# Patient Record
Sex: Female | Born: 1975 | Race: Black or African American | Hispanic: No | Marital: Single | Smoking: Never smoker
Health system: Southern US, Community
[De-identification: ages and names within clinical notes are randomized; demographics above are authoritative.]

## PROBLEM LIST (undated history)

## (undated) DIAGNOSIS — T783XXA Angioneurotic edema, initial encounter: Secondary | ICD-10-CM

## (undated) DIAGNOSIS — J45909 Unspecified asthma, uncomplicated: Secondary | ICD-10-CM

## (undated) DIAGNOSIS — F319 Bipolar disorder, unspecified: Secondary | ICD-10-CM

## (undated) DIAGNOSIS — D509 Iron deficiency anemia, unspecified: Secondary | ICD-10-CM

## (undated) DIAGNOSIS — Z91148 Patient's other noncompliance with medication regimen for other reason: Secondary | ICD-10-CM

## (undated) DIAGNOSIS — N12 Tubulo-interstitial nephritis, not specified as acute or chronic: Secondary | ICD-10-CM

## (undated) DIAGNOSIS — J189 Pneumonia, unspecified organism: Secondary | ICD-10-CM

## (undated) DIAGNOSIS — E079 Disorder of thyroid, unspecified: Secondary | ICD-10-CM

## (undated) DIAGNOSIS — K219 Gastro-esophageal reflux disease without esophagitis: Secondary | ICD-10-CM

## (undated) DIAGNOSIS — B1910 Unspecified viral hepatitis B without hepatic coma: Secondary | ICD-10-CM

## (undated) DIAGNOSIS — Z9289 Personal history of other medical treatment: Secondary | ICD-10-CM

## (undated) DIAGNOSIS — Z21 Asymptomatic human immunodeficiency virus [HIV] infection status: Secondary | ICD-10-CM

## (undated) DIAGNOSIS — L509 Urticaria, unspecified: Secondary | ICD-10-CM

## (undated) DIAGNOSIS — F32A Depression, unspecified: Secondary | ICD-10-CM

## (undated) DIAGNOSIS — F329 Major depressive disorder, single episode, unspecified: Secondary | ICD-10-CM

## (undated) DIAGNOSIS — IMO0001 Reserved for inherently not codable concepts without codable children: Secondary | ICD-10-CM

## (undated) DIAGNOSIS — F23 Brief psychotic disorder: Secondary | ICD-10-CM

## (undated) DIAGNOSIS — F419 Anxiety disorder, unspecified: Secondary | ICD-10-CM

## (undated) DIAGNOSIS — Z9114 Patient's other noncompliance with medication regimen: Secondary | ICD-10-CM

## (undated) HISTORY — DX: Urticaria, unspecified: L50.9

## (undated) HISTORY — DX: Angioneurotic edema, initial encounter: T78.3XXA

## (undated) HISTORY — DX: Depression, unspecified: F32.A

## (undated) HISTORY — PX: THYROID SURGERY: SHX805

## (undated) HISTORY — DX: Major depressive disorder, single episode, unspecified: F32.9

## (undated) HISTORY — PX: CHOLECYSTECTOMY: SHX55

---

## 1998-01-26 ENCOUNTER — Inpatient Hospital Stay (HOSPITAL_COMMUNITY): Admission: AD | Admit: 1998-01-26 | Discharge: 1998-01-27 | Payer: Self-pay | Admitting: *Deleted

## 1998-04-20 ENCOUNTER — Inpatient Hospital Stay (HOSPITAL_COMMUNITY): Admission: AD | Admit: 1998-04-20 | Discharge: 1998-04-20 | Payer: Self-pay | Admitting: *Deleted

## 1998-07-14 ENCOUNTER — Emergency Department (HOSPITAL_COMMUNITY): Admission: EM | Admit: 1998-07-14 | Discharge: 1998-07-14 | Payer: Self-pay | Admitting: Emergency Medicine

## 1998-11-16 ENCOUNTER — Inpatient Hospital Stay (HOSPITAL_COMMUNITY): Admission: AD | Admit: 1998-11-16 | Discharge: 1998-11-16 | Payer: Self-pay | Admitting: *Deleted

## 1998-11-27 ENCOUNTER — Emergency Department (HOSPITAL_COMMUNITY): Admission: EM | Admit: 1998-11-27 | Discharge: 1998-11-27 | Payer: Self-pay | Admitting: Emergency Medicine

## 1999-08-30 ENCOUNTER — Emergency Department (HOSPITAL_COMMUNITY): Admission: EM | Admit: 1999-08-30 | Discharge: 1999-08-30 | Payer: Self-pay | Admitting: Emergency Medicine

## 1999-09-04 ENCOUNTER — Emergency Department (HOSPITAL_COMMUNITY): Admission: EM | Admit: 1999-09-04 | Discharge: 1999-09-04 | Payer: Self-pay

## 2000-04-24 ENCOUNTER — Encounter: Admission: RE | Admit: 2000-04-24 | Discharge: 2000-04-24 | Payer: Self-pay | Admitting: Infectious Diseases

## 2000-04-24 ENCOUNTER — Encounter (INDEPENDENT_AMBULATORY_CARE_PROVIDER_SITE_OTHER): Payer: Self-pay | Admitting: *Deleted

## 2000-04-24 ENCOUNTER — Ambulatory Visit (HOSPITAL_COMMUNITY): Admission: RE | Admit: 2000-04-24 | Discharge: 2000-04-24 | Payer: Self-pay | Admitting: Infectious Diseases

## 2000-04-24 LAB — CONVERTED CEMR LAB: CD4 T Cell Abs: 250

## 2000-05-20 ENCOUNTER — Encounter: Admission: RE | Admit: 2000-05-20 | Discharge: 2000-05-20 | Payer: Self-pay | Admitting: Internal Medicine

## 2000-05-20 ENCOUNTER — Ambulatory Visit (HOSPITAL_COMMUNITY): Admission: RE | Admit: 2000-05-20 | Discharge: 2000-05-20 | Payer: Self-pay | Admitting: Internal Medicine

## 2000-06-10 ENCOUNTER — Encounter: Admission: RE | Admit: 2000-06-10 | Discharge: 2000-06-10 | Payer: Self-pay | Admitting: Internal Medicine

## 2000-06-20 ENCOUNTER — Encounter: Admission: RE | Admit: 2000-06-20 | Discharge: 2000-06-20 | Payer: Self-pay | Admitting: Infectious Diseases

## 2000-06-24 ENCOUNTER — Encounter: Admission: RE | Admit: 2000-06-24 | Discharge: 2000-06-24 | Payer: Self-pay | Admitting: Infectious Diseases

## 2000-07-22 ENCOUNTER — Encounter: Admission: RE | Admit: 2000-07-22 | Discharge: 2000-07-22 | Payer: Self-pay | Admitting: Infectious Diseases

## 2000-08-05 ENCOUNTER — Encounter: Admission: RE | Admit: 2000-08-05 | Discharge: 2000-08-05 | Payer: Self-pay | Admitting: Infectious Diseases

## 2000-09-18 ENCOUNTER — Encounter: Admission: RE | Admit: 2000-09-18 | Discharge: 2000-09-18 | Payer: Self-pay | Admitting: Internal Medicine

## 2001-10-05 ENCOUNTER — Emergency Department (HOSPITAL_COMMUNITY): Admission: EM | Admit: 2001-10-05 | Discharge: 2001-10-05 | Payer: Self-pay | Admitting: Emergency Medicine

## 2002-01-04 ENCOUNTER — Emergency Department (HOSPITAL_COMMUNITY): Admission: EM | Admit: 2002-01-04 | Discharge: 2002-01-04 | Payer: Self-pay | Admitting: *Deleted

## 2002-02-19 ENCOUNTER — Other Ambulatory Visit: Admission: RE | Admit: 2002-02-19 | Discharge: 2002-02-19 | Payer: Self-pay | Admitting: Obstetrics and Gynecology

## 2002-03-18 ENCOUNTER — Encounter: Admission: RE | Admit: 2002-03-18 | Discharge: 2002-03-18 | Payer: Self-pay | Admitting: *Deleted

## 2002-03-18 ENCOUNTER — Ambulatory Visit (HOSPITAL_COMMUNITY): Admission: RE | Admit: 2002-03-18 | Discharge: 2002-03-18 | Payer: Self-pay | Admitting: *Deleted

## 2002-03-23 ENCOUNTER — Encounter: Admission: RE | Admit: 2002-03-23 | Discharge: 2002-03-23 | Payer: Self-pay | Admitting: Internal Medicine

## 2002-03-26 ENCOUNTER — Ambulatory Visit (HOSPITAL_COMMUNITY): Admission: RE | Admit: 2002-03-26 | Discharge: 2002-03-26 | Payer: Self-pay | Admitting: *Deleted

## 2002-03-30 ENCOUNTER — Encounter: Admission: RE | Admit: 2002-03-30 | Discharge: 2002-03-30 | Payer: Self-pay | Admitting: *Deleted

## 2002-04-01 ENCOUNTER — Encounter: Admission: RE | Admit: 2002-04-01 | Discharge: 2002-04-01 | Payer: Self-pay | Admitting: *Deleted

## 2002-04-08 ENCOUNTER — Encounter: Admission: RE | Admit: 2002-04-08 | Discharge: 2002-04-08 | Payer: Self-pay | Admitting: *Deleted

## 2002-04-08 ENCOUNTER — Encounter (INDEPENDENT_AMBULATORY_CARE_PROVIDER_SITE_OTHER): Payer: Self-pay | Admitting: *Deleted

## 2002-04-20 ENCOUNTER — Encounter: Admission: RE | Admit: 2002-04-20 | Discharge: 2002-04-20 | Payer: Self-pay | Admitting: Internal Medicine

## 2002-04-21 ENCOUNTER — Ambulatory Visit (HOSPITAL_COMMUNITY): Admission: RE | Admit: 2002-04-21 | Discharge: 2002-04-21 | Payer: Self-pay | Admitting: *Deleted

## 2002-04-22 ENCOUNTER — Encounter: Admission: RE | Admit: 2002-04-22 | Discharge: 2002-04-22 | Payer: Self-pay | Admitting: *Deleted

## 2002-05-06 ENCOUNTER — Encounter: Admission: RE | Admit: 2002-05-06 | Discharge: 2002-05-06 | Payer: Self-pay | Admitting: *Deleted

## 2002-05-19 ENCOUNTER — Inpatient Hospital Stay (HOSPITAL_COMMUNITY): Admission: AD | Admit: 2002-05-19 | Discharge: 2002-05-19 | Payer: Self-pay | Admitting: *Deleted

## 2002-05-20 ENCOUNTER — Encounter: Admission: RE | Admit: 2002-05-20 | Discharge: 2002-05-20 | Payer: Self-pay | Admitting: *Deleted

## 2002-05-26 ENCOUNTER — Observation Stay (HOSPITAL_COMMUNITY): Admission: AD | Admit: 2002-05-26 | Discharge: 2002-05-27 | Payer: Self-pay | Admitting: *Deleted

## 2002-05-28 ENCOUNTER — Inpatient Hospital Stay (HOSPITAL_COMMUNITY): Admission: AD | Admit: 2002-05-28 | Discharge: 2002-05-28 | Payer: Self-pay | Admitting: *Deleted

## 2002-06-01 ENCOUNTER — Ambulatory Visit (HOSPITAL_COMMUNITY): Admission: RE | Admit: 2002-06-01 | Discharge: 2002-06-01 | Payer: Self-pay | Admitting: Infectious Diseases

## 2002-06-01 ENCOUNTER — Encounter: Admission: RE | Admit: 2002-06-01 | Discharge: 2002-06-01 | Payer: Self-pay | Admitting: Internal Medicine

## 2002-06-03 ENCOUNTER — Encounter: Admission: RE | Admit: 2002-06-03 | Discharge: 2002-06-03 | Payer: Self-pay | Admitting: *Deleted

## 2002-06-10 ENCOUNTER — Encounter: Admission: RE | Admit: 2002-06-10 | Discharge: 2002-06-10 | Payer: Self-pay | Admitting: *Deleted

## 2002-06-15 ENCOUNTER — Encounter: Admission: RE | Admit: 2002-06-15 | Discharge: 2002-06-15 | Payer: Self-pay | Admitting: Internal Medicine

## 2002-06-17 ENCOUNTER — Encounter: Admission: RE | Admit: 2002-06-17 | Discharge: 2002-06-17 | Payer: Self-pay | Admitting: *Deleted

## 2002-06-24 ENCOUNTER — Inpatient Hospital Stay (HOSPITAL_COMMUNITY): Admission: RE | Admit: 2002-06-24 | Discharge: 2002-06-24 | Payer: Self-pay | Admitting: *Deleted

## 2002-06-24 ENCOUNTER — Encounter: Payer: Self-pay | Admitting: Obstetrics and Gynecology

## 2002-06-24 ENCOUNTER — Encounter: Admission: RE | Admit: 2002-06-24 | Discharge: 2002-06-24 | Payer: Self-pay | Admitting: *Deleted

## 2002-06-26 ENCOUNTER — Inpatient Hospital Stay (HOSPITAL_COMMUNITY): Admission: AD | Admit: 2002-06-26 | Discharge: 2002-06-26 | Payer: Self-pay | Admitting: *Deleted

## 2002-06-28 ENCOUNTER — Encounter: Payer: Self-pay | Admitting: *Deleted

## 2002-06-28 ENCOUNTER — Ambulatory Visit (HOSPITAL_COMMUNITY): Admission: RE | Admit: 2002-06-28 | Discharge: 2002-06-28 | Payer: Self-pay | Admitting: *Deleted

## 2002-06-29 ENCOUNTER — Encounter (HOSPITAL_COMMUNITY): Admission: RE | Admit: 2002-06-29 | Discharge: 2002-07-16 | Payer: Self-pay | Admitting: *Deleted

## 2002-06-29 ENCOUNTER — Encounter: Payer: Self-pay | Admitting: *Deleted

## 2002-07-01 ENCOUNTER — Encounter: Admission: RE | Admit: 2002-07-01 | Discharge: 2002-07-01 | Payer: Self-pay | Admitting: Obstetrics and Gynecology

## 2002-07-02 ENCOUNTER — Inpatient Hospital Stay (HOSPITAL_COMMUNITY): Admission: AD | Admit: 2002-07-02 | Discharge: 2002-07-02 | Payer: Self-pay | Admitting: Obstetrics and Gynecology

## 2002-07-07 ENCOUNTER — Encounter: Admission: RE | Admit: 2002-07-07 | Discharge: 2002-07-07 | Payer: Self-pay | Admitting: *Deleted

## 2002-07-09 ENCOUNTER — Encounter: Payer: Self-pay | Admitting: *Deleted

## 2002-07-14 ENCOUNTER — Encounter: Admission: RE | Admit: 2002-07-14 | Discharge: 2002-07-14 | Payer: Self-pay | Admitting: *Deleted

## 2002-07-16 ENCOUNTER — Inpatient Hospital Stay (HOSPITAL_COMMUNITY): Admission: AD | Admit: 2002-07-16 | Discharge: 2002-07-19 | Payer: Self-pay | Admitting: Obstetrics and Gynecology

## 2002-07-16 ENCOUNTER — Encounter (INDEPENDENT_AMBULATORY_CARE_PROVIDER_SITE_OTHER): Payer: Self-pay | Admitting: *Deleted

## 2002-07-16 HISTORY — PX: TUBAL LIGATION: SHX77

## 2002-07-19 ENCOUNTER — Encounter (INDEPENDENT_AMBULATORY_CARE_PROVIDER_SITE_OTHER): Payer: Self-pay | Admitting: *Deleted

## 2002-07-19 ENCOUNTER — Encounter (HOSPITAL_BASED_OUTPATIENT_CLINIC_OR_DEPARTMENT_OTHER): Payer: Self-pay | Admitting: General Surgery

## 2002-07-19 ENCOUNTER — Ambulatory Visit (HOSPITAL_COMMUNITY): Admission: RE | Admit: 2002-07-19 | Discharge: 2002-07-19 | Payer: Self-pay | Admitting: General Surgery

## 2002-08-13 ENCOUNTER — Encounter (HOSPITAL_BASED_OUTPATIENT_CLINIC_OR_DEPARTMENT_OTHER): Payer: Self-pay | Admitting: General Surgery

## 2002-08-16 HISTORY — PX: THYROID LOBECTOMY: SHX420

## 2002-08-17 ENCOUNTER — Encounter: Admission: RE | Admit: 2002-08-17 | Discharge: 2002-08-17 | Payer: Self-pay | Admitting: Internal Medicine

## 2002-08-17 ENCOUNTER — Ambulatory Visit (HOSPITAL_COMMUNITY): Admission: RE | Admit: 2002-08-17 | Discharge: 2002-08-17 | Payer: Self-pay | Admitting: Internal Medicine

## 2002-08-19 ENCOUNTER — Ambulatory Visit (HOSPITAL_COMMUNITY): Admission: RE | Admit: 2002-08-19 | Discharge: 2002-08-20 | Payer: Self-pay | Admitting: General Surgery

## 2002-08-19 ENCOUNTER — Encounter (INDEPENDENT_AMBULATORY_CARE_PROVIDER_SITE_OTHER): Payer: Self-pay | Admitting: *Deleted

## 2003-06-15 ENCOUNTER — Encounter: Admission: RE | Admit: 2003-06-15 | Discharge: 2003-06-15 | Payer: Self-pay | Admitting: Infectious Diseases

## 2003-06-15 ENCOUNTER — Ambulatory Visit (HOSPITAL_COMMUNITY): Admission: RE | Admit: 2003-06-15 | Discharge: 2003-06-15 | Payer: Self-pay | Admitting: Infectious Diseases

## 2003-06-15 ENCOUNTER — Encounter: Payer: Self-pay | Admitting: Infectious Diseases

## 2003-06-22 ENCOUNTER — Encounter: Admission: RE | Admit: 2003-06-22 | Discharge: 2003-06-22 | Payer: Self-pay | Admitting: Internal Medicine

## 2003-09-06 ENCOUNTER — Encounter: Admission: RE | Admit: 2003-09-06 | Discharge: 2003-09-06 | Payer: Self-pay | Admitting: Infectious Diseases

## 2003-09-06 ENCOUNTER — Ambulatory Visit (HOSPITAL_COMMUNITY): Admission: RE | Admit: 2003-09-06 | Discharge: 2003-09-06 | Payer: Self-pay | Admitting: Internal Medicine

## 2003-09-06 ENCOUNTER — Encounter: Payer: Self-pay | Admitting: Internal Medicine

## 2003-09-20 ENCOUNTER — Encounter: Admission: RE | Admit: 2003-09-20 | Discharge: 2003-09-20 | Payer: Self-pay | Admitting: Internal Medicine

## 2003-09-29 ENCOUNTER — Encounter (INDEPENDENT_AMBULATORY_CARE_PROVIDER_SITE_OTHER): Payer: Self-pay | Admitting: *Deleted

## 2003-09-29 ENCOUNTER — Encounter: Admission: RE | Admit: 2003-09-29 | Discharge: 2003-09-29 | Payer: Self-pay | Admitting: Obstetrics and Gynecology

## 2003-10-03 ENCOUNTER — Encounter: Payer: Self-pay | Admitting: Internal Medicine

## 2004-04-05 ENCOUNTER — Ambulatory Visit (HOSPITAL_COMMUNITY): Admission: RE | Admit: 2004-04-05 | Discharge: 2004-04-05 | Payer: Self-pay | Admitting: Internal Medicine

## 2004-04-05 ENCOUNTER — Encounter: Admission: RE | Admit: 2004-04-05 | Discharge: 2004-04-05 | Payer: Self-pay | Admitting: Internal Medicine

## 2005-01-11 ENCOUNTER — Ambulatory Visit: Payer: Self-pay | Admitting: Internal Medicine

## 2005-01-11 ENCOUNTER — Inpatient Hospital Stay (HOSPITAL_COMMUNITY): Admission: EM | Admit: 2005-01-11 | Discharge: 2005-01-15 | Payer: Self-pay | Admitting: Emergency Medicine

## 2005-01-22 ENCOUNTER — Ambulatory Visit: Payer: Self-pay | Admitting: Internal Medicine

## 2005-01-29 ENCOUNTER — Ambulatory Visit: Payer: Self-pay | Admitting: Internal Medicine

## 2005-04-01 ENCOUNTER — Encounter (INDEPENDENT_AMBULATORY_CARE_PROVIDER_SITE_OTHER): Payer: Self-pay | Admitting: *Deleted

## 2005-04-01 ENCOUNTER — Ambulatory Visit: Payer: Self-pay | Admitting: Internal Medicine

## 2005-04-01 ENCOUNTER — Ambulatory Visit (HOSPITAL_COMMUNITY): Admission: RE | Admit: 2005-04-01 | Discharge: 2005-04-01 | Payer: Self-pay | Admitting: Internal Medicine

## 2005-04-01 LAB — CONVERTED CEMR LAB: HIV 1 RNA Quant: 915 copies/mL

## 2005-04-23 ENCOUNTER — Ambulatory Visit: Payer: Self-pay | Admitting: Internal Medicine

## 2005-10-16 ENCOUNTER — Encounter (INDEPENDENT_AMBULATORY_CARE_PROVIDER_SITE_OTHER): Payer: Self-pay | Admitting: *Deleted

## 2005-10-16 ENCOUNTER — Ambulatory Visit: Payer: Self-pay | Admitting: Internal Medicine

## 2005-10-16 ENCOUNTER — Ambulatory Visit (HOSPITAL_COMMUNITY): Admission: RE | Admit: 2005-10-16 | Discharge: 2005-10-16 | Payer: Self-pay | Admitting: Internal Medicine

## 2005-10-16 LAB — CONVERTED CEMR LAB
CD4 Count: 70 microliters
HIV 1 RNA Quant: 14100 copies/mL

## 2005-10-24 ENCOUNTER — Ambulatory Visit: Payer: Self-pay | Admitting: Internal Medicine

## 2005-10-31 ENCOUNTER — Encounter: Payer: Self-pay | Admitting: Internal Medicine

## 2006-03-26 ENCOUNTER — Ambulatory Visit: Payer: Self-pay | Admitting: Internal Medicine

## 2006-04-02 ENCOUNTER — Ambulatory Visit (HOSPITAL_COMMUNITY): Admission: RE | Admit: 2006-04-02 | Discharge: 2006-04-02 | Payer: Self-pay | Admitting: Internal Medicine

## 2007-02-09 ENCOUNTER — Encounter (INDEPENDENT_AMBULATORY_CARE_PROVIDER_SITE_OTHER): Payer: Self-pay | Admitting: *Deleted

## 2007-02-09 LAB — CONVERTED CEMR LAB

## 2007-02-22 ENCOUNTER — Encounter (INDEPENDENT_AMBULATORY_CARE_PROVIDER_SITE_OTHER): Payer: Self-pay | Admitting: *Deleted

## 2009-01-30 ENCOUNTER — Emergency Department (HOSPITAL_COMMUNITY): Admission: EM | Admit: 2009-01-30 | Discharge: 2009-01-30 | Payer: Self-pay | Admitting: Emergency Medicine

## 2009-02-01 ENCOUNTER — Emergency Department (HOSPITAL_COMMUNITY): Admission: EM | Admit: 2009-02-01 | Discharge: 2009-02-01 | Payer: Self-pay | Admitting: Family Medicine

## 2009-02-07 ENCOUNTER — Emergency Department (HOSPITAL_COMMUNITY): Admission: EM | Admit: 2009-02-07 | Discharge: 2009-02-07 | Payer: Self-pay | Admitting: Emergency Medicine

## 2009-02-13 DIAGNOSIS — J189 Pneumonia, unspecified organism: Secondary | ICD-10-CM

## 2009-02-13 HISTORY — DX: Pneumonia, unspecified organism: J18.9

## 2009-02-15 ENCOUNTER — Inpatient Hospital Stay (HOSPITAL_COMMUNITY): Admission: EM | Admit: 2009-02-15 | Discharge: 2009-02-18 | Payer: Self-pay | Admitting: Emergency Medicine

## 2009-02-15 ENCOUNTER — Encounter: Payer: Self-pay | Admitting: Internal Medicine

## 2009-02-15 LAB — CONVERTED CEMR LAB: CD4 Count: 10 microliters

## 2009-02-16 ENCOUNTER — Ambulatory Visit: Payer: Self-pay | Admitting: Internal Medicine

## 2009-02-27 ENCOUNTER — Encounter: Payer: Self-pay | Admitting: Internal Medicine

## 2009-02-27 DIAGNOSIS — D509 Iron deficiency anemia, unspecified: Secondary | ICD-10-CM

## 2009-02-28 ENCOUNTER — Ambulatory Visit: Payer: Self-pay | Admitting: Internal Medicine

## 2009-02-28 ENCOUNTER — Encounter (INDEPENDENT_AMBULATORY_CARE_PROVIDER_SITE_OTHER): Payer: Self-pay | Admitting: *Deleted

## 2009-02-28 DIAGNOSIS — B59 Pneumocystosis: Secondary | ICD-10-CM | POA: Insufficient documentation

## 2009-02-28 DIAGNOSIS — A6000 Herpesviral infection of urogenital system, unspecified: Secondary | ICD-10-CM

## 2009-02-28 DIAGNOSIS — B20 Human immunodeficiency virus [HIV] disease: Secondary | ICD-10-CM

## 2009-03-01 ENCOUNTER — Encounter (INDEPENDENT_AMBULATORY_CARE_PROVIDER_SITE_OTHER): Payer: Self-pay | Admitting: *Deleted

## 2009-04-03 ENCOUNTER — Ambulatory Visit: Payer: Self-pay | Admitting: Internal Medicine

## 2009-04-05 ENCOUNTER — Encounter: Payer: Self-pay | Admitting: Internal Medicine

## 2009-04-10 ENCOUNTER — Encounter: Payer: Self-pay | Admitting: Internal Medicine

## 2009-04-12 ENCOUNTER — Encounter: Payer: Self-pay | Admitting: Internal Medicine

## 2009-04-14 ENCOUNTER — Emergency Department (HOSPITAL_COMMUNITY): Admission: EM | Admit: 2009-04-14 | Discharge: 2009-04-14 | Payer: Self-pay | Admitting: Emergency Medicine

## 2009-08-18 ENCOUNTER — Telehealth (INDEPENDENT_AMBULATORY_CARE_PROVIDER_SITE_OTHER): Payer: Self-pay | Admitting: *Deleted

## 2009-09-25 ENCOUNTER — Encounter: Payer: Self-pay | Admitting: Internal Medicine

## 2010-06-05 ENCOUNTER — Encounter (INDEPENDENT_AMBULATORY_CARE_PROVIDER_SITE_OTHER): Payer: Self-pay | Admitting: *Deleted

## 2010-06-05 ENCOUNTER — Emergency Department (HOSPITAL_COMMUNITY): Admission: EM | Admit: 2010-06-05 | Discharge: 2010-06-05 | Payer: Self-pay | Admitting: Emergency Medicine

## 2010-06-05 LAB — CONVERTED CEMR LAB: Hemoglobin: 8.2 g/dL

## 2010-06-11 ENCOUNTER — Emergency Department (HOSPITAL_COMMUNITY): Admission: EM | Admit: 2010-06-11 | Discharge: 2010-06-11 | Payer: Self-pay | Admitting: Emergency Medicine

## 2010-06-11 ENCOUNTER — Encounter (INDEPENDENT_AMBULATORY_CARE_PROVIDER_SITE_OTHER): Payer: Self-pay | Admitting: *Deleted

## 2010-06-11 LAB — CONVERTED CEMR LAB
Creatinine, Ser: 0.76 mg/dL
Hemoglobin: 9.4 g/dL

## 2010-06-12 ENCOUNTER — Encounter (INDEPENDENT_AMBULATORY_CARE_PROVIDER_SITE_OTHER): Payer: Self-pay | Admitting: *Deleted

## 2011-01-15 NOTE — Miscellaneous (Signed)
Summary: Lab results updated from ED reports  Clinical Lists Changes  Observations: Added new observation of CREATININE: 0.76 mg/dL (04/54/0981 19:14) Added new observation of PLATELETK/UL: 224 K/uL (06/11/2010 16:50) Added new observation of WBC COUNT: 2.7 10*3/microliter (06/11/2010 16:50) Added new observation of HGB: 9.4 g/dL (78/29/5621 30:86) Added new observation of GLUCOSE SER: 93 mg/dL (57/84/6962 95:28) Added new observation of PLATELETK/UL: 275 K/uL (06/05/2010 16:49) Added new observation of WBC COUNT: 4.2 10*3/microliter (06/05/2010 16:49) Added new observation of HGB: 8.2 g/dL (41/32/4401 02:72)      -  Date:  06/11/2010    Hemoglobin: 9.4    WBC: 2.7    Platelets: 224    Glucose: 93    Creatinine: 0.76  Date:  06/05/2010    Hemoglobin: 8.2    WBC: 4.2    Platelets: 275

## 2011-03-03 LAB — URINE MICROSCOPIC-ADD ON

## 2011-03-03 LAB — COMPREHENSIVE METABOLIC PANEL
AST: 38 U/L — ABNORMAL HIGH (ref 0–37)
Albumin: 3.6 g/dL (ref 3.5–5.2)
Alkaline Phosphatase: 89 U/L (ref 39–117)
Chloride: 102 mEq/L (ref 96–112)
Creatinine, Ser: 0.76 mg/dL (ref 0.4–1.2)
GFR calc Af Amer: >60 mL/min (ref >60)
Potassium: 4.3 mEq/L (ref 3.5–5.1)
Sodium: 137 mEq/L (ref 135–145)
Total Bilirubin: 0.9 mg/dL (ref 0.3–1.2)

## 2011-03-03 LAB — DIFFERENTIAL
Basophils Relative: 0 % (ref 0–1)
Basophils Relative: 0 % (ref 0–1)
Eosinophils Absolute: 0.1 10*3/uL (ref 0.0–0.7)
Eosinophils Relative: 4 % (ref 0–5)
Monocytes Absolute: 0.3 10*3/uL (ref 0.1–1.0)
Monocytes Absolute: 0.4 10*3/uL (ref 0.1–1.0)
Monocytes Relative: 10 % (ref 3–12)
Neutrophils Relative %: 70 % (ref 43–77)

## 2011-03-03 LAB — URINALYSIS, ROUTINE W REFLEX MICROSCOPIC
Bilirubin Urine: NEGATIVE
Bilirubin Urine: NEGATIVE
Ketones, ur: 40 mg/dL — AB
Ketones, ur: NEGATIVE mg/dL
Nitrite: NEGATIVE
Protein, ur: NEGATIVE mg/dL
Specific Gravity, Urine: 1.013 (ref 1.005–1.030)
Urobilinogen, UA: 0.2 mg/dL (ref 0.0–1.0)
Urobilinogen, UA: 0.2 mg/dL (ref 0.0–1.0)

## 2011-03-03 LAB — RAPID STREP SCREEN (MED CTR MEBANE ONLY): Streptococcus, Group A Screen (Direct): NEGATIVE

## 2011-03-03 LAB — CBC
Hemoglobin: 8.2 g/dL — ABNORMAL LOW (ref 12.0–15.0)
MCHC: 32.5 g/dL (ref 30.0–36.0)
MCV: 70.3 fL — ABNORMAL LOW (ref 78.0–100.0)
Platelets: 224 10*3/uL (ref 150–400)
RBC: 3.6 MIL/uL — ABNORMAL LOW (ref 3.87–5.11)
RBC: 4.19 MIL/uL (ref 3.87–5.11)
WBC: 2.7 10*3/uL — ABNORMAL LOW (ref 4.0–10.5)

## 2011-03-03 LAB — POCT PREGNANCY, URINE: Preg Test, Ur: NEGATIVE

## 2011-03-03 LAB — PROTIME-INR: Prothrombin Time: 13.9 seconds (ref 11.6–15.2)

## 2011-03-28 LAB — POCT I-STAT, CHEM 8
BUN: 5 mg/dL — ABNORMAL LOW (ref 6–23)
HCT: 28 % — ABNORMAL LOW (ref 36.0–46.0)
Hemoglobin: 9.5 g/dL — ABNORMAL LOW (ref 12.0–15.0)
Sodium: 133 mEq/L — ABNORMAL LOW (ref 135–145)
TCO2: 24 mmol/L (ref 0–100)

## 2011-03-28 LAB — BASIC METABOLIC PANEL
BUN: 5 mg/dL — ABNORMAL LOW (ref 6–23)
BUN: 5 mg/dL — ABNORMAL LOW (ref 6–23)
BUN: 9 mg/dL (ref 6–23)
CO2: 22 mEq/L (ref 19–32)
Calcium: 8.2 mg/dL — ABNORMAL LOW (ref 8.4–10.5)
Calcium: 8.7 mg/dL (ref 8.4–10.5)
Chloride: 108 mEq/L (ref 96–112)
Creatinine, Ser: 0.64 mg/dL (ref 0.4–1.2)
Creatinine, Ser: 0.66 mg/dL (ref 0.4–1.2)
GFR calc Af Amer: >60 mL/min (ref >60)
GFR calc Af Amer: >60 mL/min (ref >60)
GFR calc non Af Amer: >60 mL/min (ref >60)
Glucose, Bld: 116 mg/dL — ABNORMAL HIGH (ref 70–99)
Potassium: 4.8 mEq/L (ref 3.5–5.1)

## 2011-03-28 LAB — DIFFERENTIAL
Basophils Absolute: 0 10*3/uL (ref 0.0–0.1)
Basophils Relative: 0 % (ref 0–1)
Basophils Relative: 1 % (ref 0–1)
Eosinophils Absolute: 0.1 10*3/uL (ref 0.0–0.7)
Eosinophils Relative: 3 % (ref 0–5)
Lymphocytes Relative: 3 % — ABNORMAL LOW (ref 12–46)
Neutro Abs: 5.7 10*3/uL (ref 1.7–7.7)
Neutrophils Relative %: 79 % — ABNORMAL HIGH (ref 43–77)

## 2011-03-28 LAB — CBC
HCT: 26.3 % — ABNORMAL LOW (ref 36.0–46.0)
HCT: 26.8 % — ABNORMAL LOW (ref 36.0–46.0)
Hemoglobin: 8.7 g/dL — ABNORMAL LOW (ref 12.0–15.0)
MCHC: 32.8 g/dL (ref 30.0–36.0)
MCHC: 32.9 g/dL (ref 30.0–36.0)
MCHC: 33.2 g/dL (ref 30.0–36.0)
MCV: 70.7 fL — ABNORMAL LOW (ref 78.0–100.0)
MCV: 71.2 fL — ABNORMAL LOW (ref 78.0–100.0)
MCV: 71.5 fL — ABNORMAL LOW (ref 78.0–100.0)
Platelets: 322 10*3/uL (ref 150–400)
Platelets: 370 10*3/uL (ref 150–400)
Platelets: 375 10*3/uL (ref 150–400)
RBC: 3.56 MIL/uL — ABNORMAL LOW (ref 3.87–5.11)
RBC: 3.68 MIL/uL — ABNORMAL LOW (ref 3.87–5.11)
RDW: 16.2 % — ABNORMAL HIGH (ref 11.5–15.5)
WBC: 17.5 10*3/uL — ABNORMAL HIGH (ref 4.0–10.5)
WBC: 18.3 10*3/uL — ABNORMAL HIGH (ref 4.0–10.5)
WBC: 6.5 10*3/uL (ref 4.0–10.5)

## 2011-03-28 LAB — LIPID PANEL
Triglycerides: 52 mg/dL (ref <150)
VLDL: 10 mg/dL (ref 0–40)

## 2011-03-28 LAB — LACTATE DEHYDROGENASE
LDH: 266 U/L — ABNORMAL HIGH (ref 94–250)
LDH: 346 U/L — ABNORMAL HIGH (ref 94–250)

## 2011-03-28 LAB — POCT CARDIAC MARKERS: Myoglobin, poc: 81.9 ng/mL (ref 12–200)

## 2011-03-28 LAB — STOOL CULTURE

## 2011-03-28 LAB — CLOSTRIDIUM DIFFICILE EIA

## 2011-03-28 LAB — T-HELPER CELLS (CD4) COUNT (NOT AT ARMC)
CD4 % Helper T Cell: 2 % — ABNORMAL LOW (ref 33–55)
CD4 T Cell Abs: 10 uL — ABNORMAL LOW (ref 400–2700)

## 2011-03-28 LAB — HEMOGLOBIN A1C: Hgb A1c MFr Bld: 6.1 % (ref 4.6–6.1)

## 2011-03-28 LAB — HOMOCYSTEINE: Homocysteine: 6 umol/L (ref 4.0–15.4)

## 2011-03-28 LAB — TSH: TSH: 0.971 u[IU]/mL (ref 0.350–4.500)

## 2011-03-28 LAB — D-DIMER, QUANTITATIVE: D-Dimer, Quant: 0.8 ug/mL-FEU — ABNORMAL HIGH (ref 0.00–0.48)

## 2011-03-28 LAB — GIARDIA/CRYPTOSPORIDIUM SCREEN(EIA)

## 2011-04-02 LAB — CBC
Hemoglobin: 9.2 g/dL — ABNORMAL LOW (ref 12.0–15.0)
MCHC: 33.5 g/dL (ref 30.0–36.0)
Platelets: 272 10*3/uL (ref 150–400)
RDW: 15.7 % — ABNORMAL HIGH (ref 11.5–15.5)

## 2011-04-02 LAB — DIFFERENTIAL
Basophils Absolute: 0 10*3/uL (ref 0.0–0.1)
Basophils Relative: 0 % (ref 0–1)
Eosinophils Relative: 1 % (ref 0–5)
Monocytes Absolute: 0.2 10*3/uL (ref 0.1–1.0)
Neutro Abs: 5.3 10*3/uL (ref 1.7–7.7)

## 2011-04-02 LAB — URINALYSIS, ROUTINE W REFLEX MICROSCOPIC
Ketones, ur: NEGATIVE mg/dL
Nitrite: NEGATIVE
Protein, ur: NEGATIVE mg/dL

## 2011-04-02 LAB — POCT I-STAT, CHEM 8
Calcium, Ion: 1 mmol/L — ABNORMAL LOW (ref 1.12–1.32)
Chloride: 101 mEq/L (ref 96–112)
HCT: 28 % — ABNORMAL LOW (ref 36.0–46.0)
TCO2: 24 mmol/L (ref 0–100)

## 2011-04-30 NOTE — Discharge Summary (Signed)
NAMEJOSILYN, [name]               ACCOUNT NO.:  [id number]   MEDICAL RECORD NO.:  [id number]          PATIENT TYPE:  INP   LOCATION:  6702                         FACILITY:  [facility]   PHYSICIAN:  Michelene Gardener, MD    DATE OF BIRTH:  [birth year]   DATE OF ADMISSION:  02/14/2009  DATE OF DISCHARGE:  02/18/2009                               DISCHARGE SUMMARY   DISCHARGE DIAGNOSES:  1. 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  2. Hypoxemia that responded well to treatment, and the patient is now      saturating well on room air.  3. Human immunodeficiency virus, the patient has been noncompliant      with medications for the last 2 years.  4. Hypotension that resolved with IV fluids.  5. Diarrhea with negative stool studies.  6. Anemia.  7. Leukocytosis secondary to pneumonia and steroids.   DISCHARGE MEDICATIONS:  1. Prednisone 40 mg daily x2 days, then 30 mg daily x2 days, then 20      mg daily x2 days, then 10 mg daily x2 days, and then to stop.  2. Zithromax 1200 mg once weekly on Wednesday.  3. Diflucan 100 mg once weekly on Wednesday.  4. Bactrim 2 tablets 3 times daily for 3 weeks.   CONSULTATIONS:  Infectious Disease consultation by Cliffton Asters, MD   PROCEDURES:  None.   RADIOLOGY STUDIES:  1. Chest x-ray on February 14, 2009, showed patchy opacities in the right      lower lobe concerning for infectious etiologies.  2. Repeat x-ray on February 14, 2009, showed bilateral infiltrate with      basilar volume loss consistent with pneumonia.  3. CT angiography on February 14, 2009, showed no pulmonary embolism and      it showed bilateral airspace density consistent with widespread      pneumonia and 1-cm density in the left upper lobe that represent a      scar versus granuloma.  4. Chest x-ray on February 17, 2009, showed resolution of the airspace      disease with no evidence of infection or acute process.   Follow up with Dr. Cliffton Asters within a week.   COURSE OF HOSPITALIZATION:  1. Bilateral pneumonia.  This patient has been complaining of symptoms      of pneumonia for some time and was treated on different regimen of      outpatient treatment and she failed outpatient treatment.  On the      time of presentation, she underwent a chest x-ray and a CT scan of      the chest and results were mentioned above.  The patient was      admitted to the hospital for further evaluation.  She was started      on IV Avelox in addition to Solu-Medrol.  The assumption was      community-acquired pneumonia and possible PCP pneumonia with her      history of HIV.  The patient was also started on IV Bactrim to  cover the possibility of PCP pneumonia.  Sputum was ordered for      PCP.  The patient was not able to produce any sputum during the      hospital that has been induced by respiratory therapy at least for      3 days without success.  Infectious Disease consultation was done      by Dr. Cliffton Asters, who think her condition is mostly consistent      with PCP pneumonia.  The patient responded very well to the      treatment.  Her oxygen was titrated and she was able to maintain      very good saturation in the room air at the time of discharge.  At      the time of discharge, Dr. Orvan Falconer recommended to discontinue her      Avelox and to continue her in Bactrim.  I will put her in 3 weeks      of Bactrim to complete the full course for PCP.  The patient will      be discharged on prednisone tapering dose in addition to Bactrim.  2. Hypoxemia that was secondary to pneumonia and as mentioned above,      the patient was started initially on oxygen and that was titrated      down.  At the time of discharge, the patient was in room air with      saturation in high 90s.  3. Rule out TB that was a low suspicion and initially, the patient was      put in airborne isolation.  PPD was done and it was negative.  As       mentioned above, the patient was not able to produce sputum and      that was actually unsuccessful even with sputum induction.  Dr.      Orvan Falconer think this is very low possibility and no need for further      workup in that regard at this point.  4. HIV.  This patient has not been compliant with her medications for      more than 2 years.  A CD4 was done during this hospitalization and      it was only 10.  The patient was put in prophylactic medications      including Zithromax to be taken once a week in addition to Diflucan      100 mg once a week and Bactrim.  The patient will follow with Dr.      Cliffton Asters as an outpatient for further adjustment on her HIV      medications.  5. Hypotension that has been temporary and resolved with IV fluids.  6. Diarrhea.  Again that has been very mild.  There was suspicion      about possible C. diff because she has been on recent antibiotics.      For that the patient was placed on contact isolation and she was      started on Flagyl.  Since her presentation, she has been      asymptomatic and her diarrhea resolved.  C. diff was sent and it      came to be negative.  Contact isolation was discontinued and Flagyl      was discontinued.  7. Anemia.  Her hemoglobin remained stable.  8. Leukocytosis that was secondary to pneumonia in addition to      steroids.  At the time of discharge, her  WBC is still around 18.3.      I advised her to repeat her CBC when she sees Dr. Orvan Falconer to make      sure that her white counts are improving and that expected after      she finished her prednisone and her full treatment for pneumonia.   Otherwise, other medical conditions are stable at this time.  The  patient is to be discharged in stable condition.   TOTAL ASSESSMENT TIME:  40 minutes.      Michelene Gardener, MD  Electronically Signed     NAE/MEDQ  D:  02/18/2009  T:  02/19/2009  Job:  [id number]

## 2011-04-30 NOTE — H&P (Signed)
[name], [name]               ACCOUNT NO.:  [id number]   MEDICAL RECORD NO.:  [id number]          PATIENT TYPE:  INP   LOCATION:  6702                         FACILITY:  [facility]   PHYSICIAN:  Vania Rea, M.D. DATE OF BIRTH:  [birth year]   DATE OF ADMISSION:  02/15/2009  DATE OF DISCHARGE:                              HISTORY & PHYSICAL   PRIMARY CARE PHYSICIAN:  Unassigned.   CHIEF COMPLAINT:  Persistent cough for two months.   HISTORY OF PRESENT ILLNESS:  This is a 35 year old African American lady  who was diagnosed with HIV/AIDS at least seven years ago on computer  records, was at one time on HAART medication, her last documented CD4 in  our system was 51 in November 2006.  She said she has taken no  medication for the past two years and has been keeping well until about  two months ago when she developed a cough, fever and chills and was  initially self medicated with over-the-counter cough medications without  success, then acquired some tetracyclines which she took for about four  days without improvement.  Subsequently went to an urgent care, received  Z-pack, again no improvement.  Subsequently, received steroids and  bronchodilators for a diagnosis of bronchitis.  Again, without  improvement, came to our emergency room on February 23 and was treated  with prednisone and Tessalon Perles, again without improvement, and  eventually she turned up this time and had chest x-rays and CT scan done  which showed bilateral pneumonia and extensive ground-glass appearance  on CT scan.  Hospitalist service called to assist with management.   The patient complained of central chest pain worse with coughing.  She  complains of a 15-pound weight loss over the past three weeks.  Also,  complains of diarrhea for the past two days.   She says she has not taken any HIV medications for the past two years  because she lost her insurance.   PAST MEDICAL HISTORY:  1. Iron deficiency  anemia associated with menorrhagia.  2. HIV/AIDS.  3. Past history of hepatitis B with negative hepatic enzymes at the      last check.  4. 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  5. History of abnormal Pap smear.  6. Past history of pyelonephritis.   MEDICATIONS:  As noted above, currently, albuterol and iron.   ALLERGIES:  No known drug allergies.   SOCIAL HISTORY:  She denies tobacco or illicit drug use.  She uses  alcohol occasionally, works as a Sales executive, lives with her two  children ages 27 and 89.   FAMILY HISTORY:  Mother with coronary artery disease, cervical cancer,  breast cancer, diabetes and hypertension.  Father with coronary artery  disease.  Siblings are otherwise healthy.   REVIEW OF SYSTEMS:  A 10-point review of systems otherwise unremarkable.   PHYSICAL EXAMINATION:  GENERAL:  A thin, ill-looking, young, African  American lady lying in a stretcher.  VITAL SIGNS:  Temperature 97.8, pulse 76, respirations 20, blood  pressure 117/72, saturating at 100% on 2 liters.  HEENT:  Pupils are round and equal.  Mucous membranes pink and  anicteric.  She has no cervical lymphadenopathy or thyromegaly.  No  jugular venous distention.  CHEST:  Respiration is shallow.  Limited by pain.  She has occasional  rhonchi.  No crackles are heard.  CARDIOVASCULAR:  Regular rhythm.  ABDOMEN:  Soft and nontender.  EXTREMITIES:  Without edema.  She has 2+ pulses bilaterally.  CENTRAL NERVOUS SYSTEM:  Cranial nerves 2-12 are grossly intact.  She  has no focal neurological deficits.   LABORATORY DATA:  CBC is remarkable for a white count of 4.3, hemoglobin  9.1, MCV 7.7, platelets 370.  Her serum chemistry:  Sodium low at 133,  potassium 4.1, chloride 105, BUN 5, creatinine 0.7, otherwise,  unremarkable.  Chest x-ray as noted above.   PLAN:  Will admit this lady for a treatment of presumed PCP.  Although,   it is less likely, we will isolate her and collect sputum for AFB and  also place a PPD.  Will consult the Infectious Disease service for  assistance with management.  Other plans as per orders.      Vania Rea, M.D.  Electronically Signed     LC/MEDQ  D:  02/15/2009  T:  02/15/2009  Job:  [id number]

## 2011-05-03 NOTE — Op Note (Signed)
NAME:  [name], [name]                         ACCOUNT NO.:  [id number]   MEDICAL RECORD NO.:  [id number]                   PATIENT TYPE:  OIB   LOCATION:  5727                                 FACILITY:  [facility]   PHYSICIAN:  Luisa Hart L. Lurene Shadow, M.D.             DATE OF BIRTH:  March 03, 1976   DATE OF PROCEDURE:  08/19/2002  DATE OF DISCHARGE:                                 OPERATIVE REPORT   PREOPERATIVE DIAGNOSIS:  Follicular adenoma left thyroid lobe.   POSTOPERATIVE DIAGNOSIS:  Follicular adenoma left thyroid lobe, pathology  pending.   OPERATION:  Left thyroid lobectomy and isthmectomy.   SURGEON:  Mardene Celeste. Lurene Shadow, M.D.   ASSISTANTRiley Lam A. Magnus Ivan, M.D.   ANESTHESIA:  General   INDICATIONS FOR PROCEDURE:  The patient is a 35 year old woman who presented  with an enlarged left thyroid mass.  On cord biopsy this shows a follicular  tumor.  She is brought to the operating room now after the risks and  potential benefits of thyroid surgery have been fully discussed and she  gives consent.   DESCRIPTION OF PROCEDURE:  Following the induction of satisfactory general  anesthesia with the patient positioned supinely and head and neck  hyperextended, the neck was prepped and draped to be included in the sterile  operative field.  A transverse incision is made approximately two  fingerbreadths above the sternal notch and carried down to the skin,  subcutaneous tissues and across the platysma muscle.  Superior flap is  raised up to the thyroid cartilage and inferior flap carried down to the  sternal notch.  Strap muscles were opened in the midline and dissection  carried over toward the left.  The large thyroid nodule was then mobilized  medially.  The dissection was carried down so as to confirm the position of  the left lower parathyroid gland and the recurrent laryngeal nerve.  Both of  these protected and secured throughout the course of the operation.  The  thyroid  isthmus and lower portal vessels were taken between clamps and  secured with ties of 4-0 silk.  Dissection was carried up along the nerve up  to the upper pole.  The upper pole vessels were then secured and doubly tied  with 0 silk sutures.  The thyroid was then dissected free from the anterior  trachea including the isthmus.  The isthmus was then transected with  straight clamps and secured with suture ligatures of 3-0 Vicryl.  Specimen  was then forwarded to pathology for frozen section.  Frozen diagnosis is  consistent with a follicular adenoma with some purple cell features.  Final  pathology is pending.  All areas of the dissection within the neck were then  checked for hemostasis.  All areas of dissection were noted to be dry.  Sponge, instrument and sharp counts were verified.  The strap muscles were  closed in the midline with a  running 3-0 Vicryl suture.  The platysma muscle  was reapproximated with interrupted 3-0 Vicryl sutures and the skin was  closed with a 5-0 Monocryl running suture.  The wounds were then reinforced  with Steri-Strips.  Sterile dressings were applied.  The anesthetic reversed  and the patient removed from the operating room to the recovery room in  stable condition.  She tolerated the procedure well.                                               Mardene Celeste Lurene Shadow, M.D.   PLB/MEDQ  D:  08/19/2002  T:  08/19/2002  Job:  [id number]

## 2011-05-03 NOTE — Group Therapy Note (Signed)
NAME:  [name], [name] NO.:  [id number]   MEDICAL RECORD NO.:  [id number]                   PATIENT TYPE:  OUT   LOCATION:  [facility]                           FACILITY:  [facility]   PHYSICIAN:  Argentina Donovan, MD                     DATE OF BIRTH:  21-Dec-1975   DATE OF SERVICE:  09/29/2003                                    CLINIC NOTE   SUBJECTIVE:  The patient is a 35 year old African-American female with a  past medical history significant for HIV.  She presents today for a complete  physical exam and Pap smear.  She is followed by Dr. Orvan Falconer at the ID  clinic every two months.  In general she has been doing fairly well.  Her  only complaint today is that she has lost the majority of her breast tissue  bilaterally following her last pregnancy in 2003.  No other concerns today.   MEDICATIONS:  None.   ALLERGIES:  No known drug allergies.   SURGICAL HISTORY:  1. Partial thyroidectomy in 2003.  2. BTL.   SOCIAL HISTORY:  She is single and lives with her fiance, her two children,  and his two children.  She works both as a Systems developer.  Denies any  tobacco or drug use.  Admits to occasional alcohol use.   FAMILY HISTORY:  Her mom is alive with a history of diabetes mellitus and  breast cancer.  Her father is alive with a history of diabetes mellitus and  coronary artery disease.   OBSTETRICAL HISTORY:  She is a G3 P2-0-1-2.  Both of her children were born  by vaginal delivery.   GYNECOLOGICAL HISTORY:  She has a history of abnormal Pap smears in March  2003 which showed low-grade squamous intraepithelial lesion and also in  April 2003 which showed atypical squamous cells.  She did not follow up for  these abnormal Pap smears.   REVIEW OF SYSTEMS:  Stable weight, good appetite.  No fever, chills, or  sweats.  No lightheadedness or dizziness.  No palpitations.  No nausea,  vomiting, or diarrhea.  No abdominal pain.  No urinary complaints.   No  abnormal vaginal bleeding or discharge.   OBJECTIVE:  VITAL SIGNS:  Blood pressure 125/81, pulse 80, weight 150.7  pounds.  GENERAL:  Very pleasant, well-nourished, well-developed, in no acute  distress.  HEENT:  With normocephalic, atraumatic.  Extraocular movements intact.  Pupils equally round and reactive to light bilaterally.  Oropharynx clear  with moist mucous membranes.  NECK:  Supple without lymphadenopathy or thyromegaly.  LUNGS:  Clear to auscultation bilaterally with normal respiratory effort.  HEART:  Reveals a regular rate and rhythm.  ABDOMEN:  Soft, nontender, nondistended, with normal active bowel sounds.  EXTREMITIES:  No edema.  NEUROLOGIC:  Nonfocal.  BREASTS:  No palpable masses bilaterally, no nipple discharge, no axillary  adenopathy,  and essentially no breast tissue.  GENITOURINARY:  Revealed normal-appearing external genitalia, normal-  appearing vaginal mucosa.  Pap smear performed.  No adnexal tenderness or  cervical motion tenderness on bimanual examination.   ASSESSMENT AND PLAN:  1. Complete physical examination and Pap smear performed.  2. Human immunodeficiency virus positive.  3. History of abnormal Pap smears.   The patient informed that she would be notified of her Pap smear results and  appropriate follow-up would be scheduled.     Laruth Bouchard, MD                           Argentina Donovan, MD    TL/MEDQ  D:  09/29/2003  T:  09/30/2003  Job:  [id number]

## 2011-05-03 NOTE — Discharge Summary (Signed)
[facility]  Patient:    [name], [name] Visit Number: [id number] MRN: [id number]          Service Type: OBS Location: [facility] Attending Physician:  Michaelle Copas Dictated by:   Ed Blalock. Burnadette Peter, M.D. Admit Date:  05/28/2002                             Discharge Summary  DATE OF BIRTH:                   11-Dec-1976  HISTORY OF PRESENT ILLNESS:      The patient is a 35 year old, G3, P1-0-1-1, presenting at 32-0/7 weeks based on last menstrual period and a 23-week ultrasound.  She was sent for ultrasound because of a dynamic cervix about 1.5 cm with funneling.  She was having no contractions.  Membranes were intact.  She had no vaginal bleeding and reported good fetal activity.  She denies cramps, positive pressure, she has pressure on her bottom, positive symptoms for yeast.  She was seen May 20, 2002 after her other child hit her in the abdomen.  She complained of left lower quadrant pain and was seen by Dr. Perlie Gold then.  She was treated for a UTI.  She works at a day care. Source of care is the high risk clinic and ID clinic with onset of care at 16 weeks.  Pregnancy is complicated by HIV with lapsed care, restarted on HAART therapy this pregnancy in the second trimester, she had a CIN on colposcopy done, she needs a repeat colposcopy postpartum, as well as mild fetal renal pyelectasis and she needs her ultrasound rescheduled.  MEDICATIONS:                     HAART therapy, prenatal vitamins, Septra.  OBSTETRICAL HISTORY:             Negative for preterm labor.  GYNECOLOGICAL HISTORY:           As above.  MEDICAL HISTORY:                 As above.  FAMILY AND SOCIAL HISTORY:       Denied tobacco, alcohol, illicit drug use.  PRENATAL LABORATORIES:           Prenatal labs were done and were normal.  CD4 count was 180 with a viral load of 8263.  PHYSICAL EXAMINATION:            Blood pressure 123/70, heart rate 73. Generally,  she was in no acute distress.  Abdomen was soft, nontender, fundal height 29.  Extremities showed no edema.  Speculum exam showed white adherent discharge.  Cervical exam was posterior, soft, 50% and -1 cephalic with positive development of lower uterine segment.  Fetal heart rate baseline was 130-135 with variability and it was good and reactive.  Ultrasound showed a grade III placenta, AFI of 13.6, it was 50-75th percentile growth and the cervix 1.5 and dynamic.  HOSPITAL COURSE:                 The patient was admitted and started on antibiotics due to a wet prep that showed moderate wbcs for cervicitis. She also received Diflucan for presumptive for yeast and she was to continue on her Septra prophylaxis.  She received betamethasone x2 doses prior to discharge.  The patient reports no contractions or cramping at the  time of discharge, no contractions were noted on the tocodynamometer.  After discussion with Dr. Orlene Erm it was therefore felt that the patient could be discharged home for followup for cervical check and then high risk clinic followup since she had a negative fetal fibronectin during the admission and a negative history for preterm delivery.  DISCHARGE DIET:                  Regular.  DISCHARGE ACTIVITY:              Bed rest and pelvic rest.  DISCHARGE MEDICATIONS:           None.  DISCHARGE FOLLOWUP:              She is to be seen in high risk clinic June 03, 2002 at 9 a.m. and Dr. Lovina Reach office on June 01, 2002 for infectious disease followup.  She is to be seen in maternity admissions May 28, 2002 for cervical check.  GC and Chlamydia and group B strep were pending at the time of discharge and need followup when the patient returns to high risk clinic.  DISCHARGE CONDITION:             Improved.  DISCHARGE DIAGNOSES:             1. Intrauterine pregnancy at 32-plus weeks.                                  2. Human immunodeficiency virus/acquired                                      immunodeficiency syndrome.                                  3. Shortened cervix.                                  4. Developed lower uterine segment. Dictated by:   Ed Blalock. Burnadette Peter, M.D. Attending Physician:  Michaelle Copas DD:  05/27/02 TD:  05/29/02 Job: [id number]

## 2011-05-03 NOTE — Op Note (Signed)
   NAME:  [name], [name]                         ACCOUNT NO.:  [id number]   MEDICAL RECORD NO.:  [id number]                   PATIENT TYPE:  REC   LOCATION:  [facility]                                 [facility]   PHYSICIAN:  Conni Elliot, M.D.             DATE OF BIRTH:  12/15/1976   DATE OF PROCEDURE:  07/17/2002  DATE OF DISCHARGE:  07/16/2002                                 OPERATIVE REPORT   PREOPERATIVE DIAGNOSES:  Desires surgical sterilization.   POSTOPERATIVE DIAGNOSES:  Desires surgical sterilization.   OPERATION PERFORMED:  Modified bilateral Pomeroy tubal ligation.   SURGEON:  Conni Elliot, M.D.   ANESTHESIA:  Lumbar  epidural.   OPERATIVE FINDINGS:  The uterus, tubes and ovaries in normal postoperative  state.   DESCRIPTION OF PROCEDURE:  After placing the patient with continuous lumber  epidural anesthetic and prepped and draped in a sterile fashion, a  periumbilical incision was made.  Incision was made through the skin to the  fascia.  Abdominal cavity entered. The right tube was identified, grasped  with Babcock clamp and followed to its fimbriated end.  A segment of tube  was brought into the operative field, double suture ligated and  approximately __________ was excised. Hemostasis adequate. The same  procedure was done on the opposite side. Hemostasis was adequate.  Peritoneum, fascia, and skin closed in the usual fashion.  Estimated blood  loss was less than 50 cc.                                                Conni Elliot, M.D.    ASG/MEDQ  D:  07/17/2002  T:  07/22/2002  Job:  [id number]

## 2011-05-03 NOTE — Discharge Summary (Signed)
[name], [name]               ACCOUNT NO.:  [id number]   MEDICAL RECORD NO.:  [id number]          PATIENT TYPE:  INP   LOCATION:  5529                         FACILITY:  [facility]   PHYSICIAN:  Duncan Dull, M.D.     DATE OF BIRTH:  1976-11-15   DATE OF ADMISSION:  01/11/2005  DATE OF DISCHARGE:  01/15/2005                                 DISCHARGE SUMMARY   PHYSICIANS:  Attending:  Duncan Dull, M.D.  Resident:  Manning Charity, M.D.   DISCHARGE DIAGNOSES:  1.  Human immunodeficiency virus.  2.  Pyelonephritis.  3.  Bacterial vaginosis.  4.  Microcytic anemia.  5.  History of hepatitis B.  6.  Status post tubal ligation.  7.  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  8.  History of abnormal Pap smear.   DISCHARGE MEDICATIONS:  1.  Cipro 500 mg b.i.d. for a total of 10 days.  2.  Metamizole 500 mg b.i.d. for a total of 10 days.  3.  Iron sulfate 325 mg t.i.d.  4.  Bactrim double strength 1 tablet Monday, Wednesday, and Friday.   DISPOSITION:  The patient was discharged home.  She is to follow up in the  [facility], in the acute care clinic, on January 22, 2005,  at 9:30 a.m.  At that time, she will need to be plugged in to someone to try  to start her on HAART therapy.   BRIEF ADMISSION HISTORY OF PRESENT ILLNESS:  This is a 35 year old African-  American female with history of HIV who had a CD-4 count in April 2005 that  was 270 with a viral load of 71,800, status post thyroid adenoma resection.  Came to the ER with increasing pain in her back for two to three days.  The  pain began around her umbilicus but moved to her left flank.  She had some  nausea but no vomiting with it, had increased micturition but no dysuria.  She also had a positive foul vaginal discharge for the past three days as  well as a sore throat.  She was diagnosed with HIV in March 2001.   PHYSICAL EXAMINATION ON THIS ADMISSION:  VITAL SIGNS:  Pulse 111,  blood  pressure 103/67, temperature 100, respirations 18, O2 saturation 100% on  room air.  GENERAL:  She is awake, tearful, in pain, in moderate distress.  HEENT:  Eyes were PERRLA.  ENT:  Oropharynx was clear, no sign of thrush.  NECK:  No JVD, no thyromegaly. She had an old surgical scar on her neck.  RESPIRATIONS:  Clear to auscultation bilaterally.  CARDIOVASCULAR:  Tachycardic, regular rate and rhythm.  No murmurs, rubs, or  gallops.  ABDOMEN: Soft.  She had mild CVA tenderness on the left side, positive bowel  sounds.  Small scar near umbilicus.  EXTREMITIES:  No cyanosis, clubbing, or edema.  LYMPH:  No lymphadenopathy was noted.  NEUROLOGIC:  Grossly intact.  PSYCHIATRIC:  Appropriate, tearful.   LABORATORY DATA AND OTHER STUDIES:  UA:  White blood cells too numerous to  count, red  blood cells 3 to 6, bacteria a few, blood leukocyte esterase  large, nitrite negative.  Sodium 136, potassium 3.5, chloride 102, bicarb  27, BUN 6, creatinine 0.9, glucose 101.  Liver enzymes normal except for  albumin of 3.0.   Plain film of the abdomen showed a nonspecific 2 mm left pelvic  calcification, but CT scan was normal and showed this calcification to be  phlebolith.   Urine pregnancy was negative.  Wet prep was positive for moderate clue  cells, white blood cells too numerous to count.   HOSPITAL COURSE:  #1.  PYELONEPHRITIS:  The patient had a urine culture that grew out E. coli  sensitive to ciprofloxacin.  The patient received 3 days of IV ciprofloxacin  400 q.12h. and then was transitioned to 500 mg b.i.d. and was discharged to  complete a 10-day course of ciprofloxacin.   #2.  BACTERIAL VAGINOSIS:  The patient was given oral Flagyl 500 mg b.i.d.  and was discharged to complete a 7-day course.  Also during this admission,  she was tested for [facility] as well as syphilis.  All of these tests were  negative.   #3.  HUMAN IMMUNODEFICIENCY VIRUS:  Her CD-4 count on this admission  was  130.  She was started on PCP prophylaxis with Bactrim double strength 1  tablet p.o. Monday, Wednesday, and Friday.  She will need to start HAART  therapy as an outpatient, and on her hospital followup visit, she will need  to be plugged into a physician who can monitor this.   #4.  ANEMIA:  The patient has microcytic anemia.  She has a history of heavy  menses.  She completed her last menstrual period the day of admission, was  noted to have a hemoglobin of 11.5 on admission which dropped to 8.8 with  fluid hydration.  On the day of discharge, hemoglobin was 10.5.  The patient  was started on iron replacement therapy 325 mg t.i.d.  This will need to be  evaluated in the future.   #5.  HEPATITIS B:  Liver enzymes were tested on admission, and they were  found to be normal.  No further workup was done at this time.   LABORATORY DATA ON DAY OF DISCHARGE:  White count 3.4, hemoglobin 10.5,  platelets 262, ANC 2.1, MCV 70.4.      SD/MEDQ  D:  01/15/2005  T:  01/15/2005  Job:  [id number]   cc:   Manning Charity, MD  [facility]  [address]  [place]  [place] [address]  Fax: [phone]   Cliffton Asters, M.D.  [address] [place]  [place] [address]  Fax: [phone]

## 2011-11-05 ENCOUNTER — Emergency Department (HOSPITAL_COMMUNITY)
Admission: EM | Admit: 2011-11-05 | Discharge: 2011-11-05 | Disposition: A | Discharge status: Home / Self Care | Payer: Self-pay | Attending: Emergency Medicine | Admitting: Emergency Medicine

## 2011-11-05 DIAGNOSIS — R51 Headache: Secondary | ICD-10-CM | POA: Insufficient documentation

## 2011-11-05 DIAGNOSIS — IMO0002 Reserved for concepts with insufficient information to code with codable children: Secondary | ICD-10-CM | POA: Insufficient documentation

## 2011-11-05 DIAGNOSIS — Y990 Civilian activity done for income or pay: Secondary | ICD-10-CM | POA: Insufficient documentation

## 2011-11-05 DIAGNOSIS — Z21 Asymptomatic human immunodeficiency virus [HIV] infection status: Secondary | ICD-10-CM | POA: Insufficient documentation

## 2011-11-05 DIAGNOSIS — Y9289 Other specified places as the place of occurrence of the external cause: Secondary | ICD-10-CM | POA: Insufficient documentation

## 2011-11-05 HISTORY — DX: Asymptomatic human immunodeficiency virus (hiv) infection status: Z21

## 2011-11-05 MED ORDER — OXYCODONE-ACETAMINOPHEN 5-325 MG PO TABS: 1.0000 | ORAL_TABLET | Freq: Four times a day (QID) | ORAL | Status: AC | PRN
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
## 2011-11-06 NOTE — ED Provider Notes (Signed)
Medical screening examination/treatment/procedure(s) were performed by non-physician practitioner and as supervising physician I was immediately available for consultation/collaboration.    Nelia Shi, MD 11/06/11 [phone]

## 2012-01-30 ENCOUNTER — Other Ambulatory Visit: Payer: Medicaid Other

## 2012-01-30 ENCOUNTER — Other Ambulatory Visit: Payer: Self-pay | Admitting: Internal Medicine

## 2012-01-30 DIAGNOSIS — Z79899 Other long term (current) drug therapy: Secondary | ICD-10-CM

## 2012-01-30 DIAGNOSIS — Z113 Encounter for screening for infections with a predominantly sexual mode of transmission: Secondary | ICD-10-CM

## 2012-01-30 DIAGNOSIS — B20 Human immunodeficiency virus [HIV] disease: Secondary | ICD-10-CM

## 2012-01-30 LAB — CBC WITH DIFFERENTIAL/PLATELET
Eosinophils Relative: 4 % (ref 0–5)
HCT: 28.3 % — ABNORMAL LOW (ref 36.0–46.0)
Lymphocytes Relative: 29 % (ref 12–46)
Lymphs Abs: 0.7 10*3/uL (ref 0.7–4.0)
MCV: 66.4 fL — ABNORMAL LOW (ref 78.0–100.0)
Monocytes Absolute: 0.2 10*3/uL (ref 0.1–1.0)
RBC: 4.26 MIL/uL (ref 3.87–5.11)
WBC: 2.5 10*3/uL — ABNORMAL LOW (ref 4.0–10.5)

## 2012-01-30 LAB — COMPREHENSIVE METABOLIC PANEL
ALT: 28 U/L (ref 0–35)
Albumin: 4.2 g/dL (ref 3.5–5.2)
CO2: 26 mEq/L (ref 19–32)
Calcium: 9 mg/dL (ref 8.4–10.5)
Chloride: 104 mEq/L (ref 96–112)
Creat: 0.83 mg/dL (ref 0.50–1.10)

## 2012-01-30 LAB — RPR

## 2012-01-30 LAB — LIPID PANEL: HDL: 42 mg/dL (ref >39)

## 2012-01-31 LAB — T-HELPER CELL (CD4) - (RCID CLINIC ONLY)
CD4 % Helper T Cell: <1 % — ABNORMAL LOW (ref 33–55)
CD4 T Cell Abs: <10 uL — ABNORMAL LOW (ref 400–2700)

## 2012-02-03 LAB — HIV-1 RNA QUANT-NO REFLEX-BLD
HIV 1 RNA Quant: 726932 {copies}/mL — ABNORMAL HIGH
HIV-1 RNA Quant, Log: 5.86 {Log} — ABNORMAL HIGH

## 2012-02-20 ENCOUNTER — Encounter: Payer: Self-pay | Admitting: Internal Medicine

## 2012-02-20 ENCOUNTER — Ambulatory Visit (INDEPENDENT_AMBULATORY_CARE_PROVIDER_SITE_OTHER): Payer: Medicaid Other | Admitting: Internal Medicine

## 2012-02-20 VITALS — BP 123/81 | HR 84 | Temp 98.8°F | Ht 72.0 in | Wt 154.8 lb

## 2012-02-20 DIAGNOSIS — Z23 Encounter for immunization: Secondary | ICD-10-CM

## 2012-02-20 DIAGNOSIS — B20 Human immunodeficiency virus [HIV] disease: Secondary | ICD-10-CM

## 2012-02-20 MED ORDER — DAPSONE 100 MG PO TABS: 100.0000 mg | ORAL_TABLET | Freq: Every day | ORAL | Status: DC

## 2012-02-20 MED ORDER — EMTRICITABINE-TENOFOVIR DF 200-300 MG PO TABS: 1.0000 | ORAL_TABLET | Freq: Every day | ORAL | Status: DC

## 2012-02-20 MED ORDER — DARUNAVIR ETHANOLATE 800 MG PO TABS: 800.0000 mg | ORAL_TABLET | Freq: Every day | ORAL | Status: DC

## 2012-02-20 MED ORDER — AZITHROMYCIN 600 MG PO TABS: 1200.0000 mg | ORAL_TABLET | ORAL | Status: DC

## 2012-02-20 MED ORDER — RITONAVIR 100 MG PO TABS: 100.0000 mg | ORAL_TABLET | Freq: Every day | ORAL | Status: DC

## 2012-02-20 MED ORDER — OMEPRAZOLE 20 MG PO CPDR: 20.0000 mg | DELAYED_RELEASE_CAPSULE | Freq: Every day | ORAL | Status: DC

## 2012-02-20 MED ORDER — FLUCONAZOLE 100 MG PO TABS: 100.0000 mg | ORAL_TABLET | ORAL | Status: DC

## 2012-02-20 NOTE — Progress Notes (Signed)
Patient ID: [name], female   DOB: November 05, 1976, 36 y.o.   MRN: [id number]  INFECTIOUS DISEASE PROGRESS NOTE    Subjective: [name] is in for her first visit in 3 years. She states that she lost her insurance and did not want to come back on it she knows that she should have. She lost her job and just recently obtained her Medicaid coverage again. 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 She is currently feeling well other than being worried about one her blood work will show. She is currently busy taking care of her 2 children ages 55 and 3.  Objective: Temp: 98.8 F (37.1 C) (03/07 1119) Temp src: Oral (03/07 1119) BP: 123/81 mmHg (03/07 1119) Pulse Rate: 84  (03/07 1119)  General: She appears quite healthy  Skin:  no rash or adenopathy Oral: Clear Lungs:  clear Cor:  regular S1 and S2 no murmurs Abdomen: Soft and nontender   Lab Results HIV 1 RNA Quant (copies/mL)  Date Value  01/30/2012 [id number]*  10/16/2005 14100 (?)   04/01/2005 915 (?)      CD4 T Cell Abs (cmm)  Date Value  01/30/2012 <10*  02/15/2009 10*  04/24/2000 250      Assessment: Not surprisingly, her viral load has accelerated and her CD4 count is extremely low given that she has not been on consistent antiretroviral therapy since 2006. Even when she had insurance coverage she had difficulty adhering to her twice daily regimen of Combivir and Kaletra. She is Medicaid she seems motivated to get back into care and resume therapy. I will start her on a once daily regimen of Truvada, Prezista and Norvir. She will also need standard prophylactic therapy. She stopped taking trimethoprim sulfamethoxazole any years ago when she had a reaction that she described as "internal itching". She did not have any fever or rash. Not convinced that this is a true allergic reaction but will use dapsone as primary PCP prophylaxis.    Plan: 1. Start Truvada, Prezista and Norvir  2. prophylactic therapy with dapsone, azithromycin and fluconazole 3. return to clinic after repeat lab work in 6 weeks   Cliffton Asters, MD [facility] [phone] pager   [phone] cell 02/20/2012, 5:12 PM

## 2012-02-21 ENCOUNTER — Ambulatory Visit (INDEPENDENT_AMBULATORY_CARE_PROVIDER_SITE_OTHER): Payer: Medicaid Other | Admitting: *Deleted

## 2012-02-21 ENCOUNTER — Other Ambulatory Visit (HOSPITAL_COMMUNITY)
Admission: RE | Admit: 2012-02-21 | Discharge: 2012-02-21 | Disposition: A | Discharge status: Home / Self Care | Payer: Medicaid Other | Source: Ambulatory Visit | Attending: Internal Medicine | Admitting: Internal Medicine

## 2012-02-21 DIAGNOSIS — Z124 Encounter for screening for malignant neoplasm of cervix: Secondary | ICD-10-CM

## 2012-02-21 DIAGNOSIS — A6000 Herpesviral infection of urogenital system, unspecified: Secondary | ICD-10-CM

## 2012-02-21 DIAGNOSIS — Z01419 Encounter for gynecological examination (general) (routine) without abnormal findings: Secondary | ICD-10-CM | POA: Insufficient documentation

## 2012-02-21 MED ORDER — VALACYCLOVIR HCL 500 MG PO TABS: 500.0000 mg | ORAL_TABLET | Freq: Two times a day (BID) | ORAL | Status: AC

## 2012-02-21 NOTE — Progress Notes (Signed)
  Subjective:     [name] is a 36 y.o. woman who comes in today for a  pap smear only. Previous abnormal Pap smears:  Yes.  Prior to her last delivery 9 years ago.  Has now hat a follow-up pap smear since that time.  Contraception: BTL  Pt. Voiced concern about possible menstrual cycle problems, having menses every 2-3 weeks.    Objective:    LMP 02/10/2012 Pelvic Exam: Pt c/o painful open lesions on her labia before her last menses. There appear to be healed lesions on her labia present on observation.  Copius, whitish/pinkish discharge when speculum introduced.  Vaginal wall appears irritated.  Pap smear obtained.   Assessment:    Screening pap smear.   Plan:    Follow up in one year, or as indicated by Pap results.   Pt given condoms.  Pt given educational materials re:  HIV and women, PAP smears, nutrition, diet, exercise, BSE and self-esteem.

## 2012-02-21 NOTE — Patient Instructions (Signed)
Your results will be ready in about a week.  I will either mail or call you with them.  Thank you for coming to the Center for your care.  Angelique Blonder

## 2012-02-27 ENCOUNTER — Other Ambulatory Visit: Payer: Self-pay | Admitting: Internal Medicine

## 2012-02-27 ENCOUNTER — Telehealth: Payer: Self-pay | Admitting: *Deleted

## 2012-02-27 DIAGNOSIS — A599 Trichomoniasis, unspecified: Secondary | ICD-10-CM

## 2012-02-27 MED ORDER — METRONIDAZOLE 500 MG PO TABS: 2000.0000 mg | ORAL_TABLET | Freq: Once | ORAL | Status: DC

## 2012-02-27 NOTE — Telephone Encounter (Signed)
Called pt to let her know PAP results and treatment prescribed for Trichomonas.  RX sent to [facility]  RN explained to pt the treatment for  Trichomonas and that her recent partners need similar treatment ASAP.  Pt stated that she would "search" information on the infection.  Pt advised to always use condoms.

## 2012-03-03 ENCOUNTER — Encounter (HOSPITAL_COMMUNITY): Payer: Self-pay | Admitting: Emergency Medicine

## 2012-03-03 ENCOUNTER — Emergency Department (HOSPITAL_COMMUNITY)
Admission: EM | Admit: 2012-03-03 | Discharge: 2012-03-08 | Disposition: A | Discharge status: Home / Self Care | Payer: Medicaid Other | Source: Home / Self Care | Attending: Emergency Medicine | Admitting: Emergency Medicine

## 2012-03-03 DIAGNOSIS — Z21 Asymptomatic human immunodeficiency virus [HIV] infection status: Secondary | ICD-10-CM | POA: Insufficient documentation

## 2012-03-03 DIAGNOSIS — F29 Unspecified psychosis not due to a substance or known physiological condition: Secondary | ICD-10-CM | POA: Insufficient documentation

## 2012-03-03 DIAGNOSIS — R4182 Altered mental status, unspecified: Secondary | ICD-10-CM | POA: Insufficient documentation

## 2012-03-03 DIAGNOSIS — B20 Human immunodeficiency virus [HIV] disease: Secondary | ICD-10-CM

## 2012-03-03 MED ORDER — ZIPRASIDONE MESYLATE 20 MG IM SOLR
20.0000 mg | Freq: Once | INTRAMUSCULAR | Status: AC
  Administered 2012-03-03: 20 mg via INTRAMUSCULAR
  Filled 2012-03-03: qty 20
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
## 2012-03-03 NOTE — ED Notes (Signed)
Pt to ED with flight of ideas.  Sister st's pt has not been taking her medication.  Has not been eating

## 2012-03-04 ENCOUNTER — Emergency Department (HOSPITAL_COMMUNITY): Payer: Medicaid Other

## 2012-03-04 ENCOUNTER — Encounter: Payer: Self-pay | Admitting: *Deleted

## 2012-03-04 LAB — DIFFERENTIAL
Basophils Relative: 0 % (ref 0–1)
Eosinophils Relative: 1 % (ref 0–5)
Lymphs Abs: 1 10*3/uL (ref 0.7–4.0)
Monocytes Absolute: 0.4 10*3/uL (ref 0.1–1.0)
Monocytes Relative: 7 % (ref 3–12)
Neutro Abs: 3.8 10*3/uL (ref 1.7–7.7)

## 2012-03-04 LAB — URINALYSIS, ROUTINE W REFLEX MICROSCOPIC
Nitrite: NEGATIVE
Protein, ur: 100 mg/dL — AB
Urobilinogen, UA: 0.2 mg/dL (ref 0.0–1.0)

## 2012-03-04 LAB — COMPREHENSIVE METABOLIC PANEL
AST: 41 U/L — ABNORMAL HIGH (ref 0–37)
BUN: 10 mg/dL (ref 6–23)
CO2: 20 mEq/L (ref 19–32)
Calcium: 9.5 mg/dL (ref 8.4–10.5)
Chloride: 101 mEq/L (ref 96–112)
Creatinine, Ser: 0.76 mg/dL (ref 0.50–1.10)
GFR calc Af Amer: >90 mL/min (ref >90)
GFR calc non Af Amer: >90 mL/min (ref >90)
Glucose, Bld: 99 mg/dL (ref 70–99)
Total Bilirubin: 0.6 mg/dL (ref 0.3–1.2)

## 2012-03-04 LAB — IRON AND TIBC: Iron: 20 ug/dL — ABNORMAL LOW (ref 42–135)

## 2012-03-04 LAB — CBC
HCT: 25.6 % — ABNORMAL LOW (ref 36.0–46.0)
MCH: 20.8 pg — ABNORMAL LOW (ref 26.0–34.0)
MCV: 64.8 fL — ABNORMAL LOW (ref 78.0–100.0)
RBC: 3.95 MIL/uL (ref 3.87–5.11)
WBC: 5.3 10*3/uL (ref 4.0–10.5)

## 2012-03-04 LAB — FERRITIN: Ferritin: 146 ng/mL (ref 10–291)

## 2012-03-04 LAB — URINE MICROSCOPIC-ADD ON

## 2012-03-04 LAB — PREGNANCY, URINE: Preg Test, Ur: NEGATIVE

## 2012-03-04 LAB — RAPID URINE DRUG SCREEN, HOSP PERFORMED: Amphetamines: NOT DETECTED

## 2012-03-04 LAB — RETICULOCYTES
RBC.: 3.74 MIL/uL — ABNORMAL LOW (ref 3.87–5.11)
Retic Ct Pct: 0.6 % (ref 0.4–3.1)

## 2012-03-04 LAB — FOLATE: Folate: >20 ng/mL

## 2012-03-04 MED ORDER — LORAZEPAM 2 MG/ML IJ SOLN
1.0000 mg | Freq: Four times a day (QID) | INTRAMUSCULAR | Status: DC | PRN
  Administered 2012-03-04: 1 mg via INTRAVENOUS
  Filled 2012-03-04: qty 1

## 2012-03-04 MED ORDER — DEXTROSE 5 % IV SOLN
1.0000 g | Freq: Once | INTRAVENOUS | Status: AC
  Administered 2012-03-04: 1 g via INTRAVENOUS
  Filled 2012-03-04: qty 10

## 2012-03-04 MED ORDER — CIPROFLOXACIN HCL 500 MG PO TABS
500.0000 mg | ORAL_TABLET | Freq: Once | ORAL | Status: AC
  Administered 2012-03-04: 500 mg via ORAL
  Filled 2012-03-04: qty 1

## 2012-03-04 NOTE — ED Notes (Signed)
Patient acting odd, has no history of psychiatric disorders, breakfast tray at bedside, patient requested hot tea which was provided.  Sitter at bedside, patient to have tele-psych consult.  Irving Burton from ACT team at bedside.

## 2012-03-04 NOTE — Consult Note (Signed)
PCP:  Cliffton Asters, MD, MD   DOA:  03/03/2012  9:05 PM  Consulting physician: Dr. Patrica Duel Reason for consultation: Medical evaluation prior to placement of a psychiatric facility.  HPI: [name] is a 36 years old African American woman with history of HIV followed by Dr. Orvan Falconer, she was recently seen in ID clinic and prescribed HAART and prophylactic therapy. Patient was brought into the ER last night because of bizarre behavior and delusions. Apparently patient and has been under a stressful situation after she lost her parents and lost her job. She does not have prior psychiatric history. Patient seen and examined, she denies any fever ,chills,  headaches , blurring of vision or neck stiffness. Patient also denies any chest pain , shortness of breath., dysuria, abdominal or flank pain, nausea, vomiting or change in bowel habits. Patient denies any suicidal or homicidal ideation. In the ED UAshowed pyuria and CT head was unremarkable  Allergies: Allergies  Allergen Reactions  . Bactrim Itching  . Orange Fruit Itching  . Peanut-Containing Drug Products Hives  . Shellfish Allergy Hives    Prior to Admission medications   Medication Sig Start Date End Date Taking? Authorizing Provider  azithromycin (ZITHROMAX) 600 MG tablet Take 2 tablets (1,200 mg total) by mouth once a week. 02/20/12 03/21/12 Yes Cliffton Asters, MD  fluconazole (DIFLUCAN) 100 MG tablet Take 1 tablet (100 mg total) by mouth once a week. 02/20/12 03/21/12 Yes Cliffton Asters, MD    Past Medical History  Diagnosis Date  . HIV positive     Past Surgical History  Procedure Date  . Thyroid surgery   . Tubal ligation      Social History: Lives with her 2 kids , lost her job recently, denies smoking or illicit drug use. Drinks alcohol occasionally.  Review of Systems:  Constitutional: Denies fever, chills, diaphoresis, appetite change and fatigue.  HEENT: Denies photophobia, eye pain, redness, hearing loss, ear pain,  congestion, sore throat, rhinorrhea, sneezing, mouth sores, trouble swallowing, neck pain, neck stiffness and tinnitus.   Respiratory: Denies SOB, DOE, cough, chest tightness,  and wheezing.   Cardiovascular: Denies chest pain, palpitations and leg swelling.  Gastrointestinal: Denies nausea, vomiting, abdominal pain, diarrhea, constipation, blood in stool and abdominal distention.  Genitourinary: Denies dysuria, urgency, frequency, hematuria, flank pain and difficulty urinating.  Musculoskeletal: Denies myalgias, back pain, joint swelling, arthralgias and gait problem.  Skin: Denies pallor, rash and wound.  Neurological: Denies dizziness, seizures, syncope, weakness, light-headedness, numbness and headaches.     Physical Exam:  Filed Vitals:   03/03/12 2128 03/04/12 0802 03/04/12 1313  BP: 126/77 127/84 129/84  Pulse:  84 77  Temp:  97.1 F (36.2 C) 97.4 F (36.3 C)  TempSrc:  Oral Oral  Resp:  18 20  SpO2: 99% 100% 98%    Constitutional: Vital signs reviewed.  Patient is in no acute distress and cooperative with exam. Alert and oriented x3. She answers questions however her speech is pressured and she intermittently giggles Eyes: PERRL, EOMI, conjunctivae normal, No scleral icterus.  Neck: Supple, Trachea midline normal ROM, No JVD, mass, thyromegaly, or carotid bruit present.  Cardiovascular: RRR, S1 normal, S2 normal, no MRG, pulses symmetric and intact bilaterally Pulmonary/Chest: CTAB, no wheezes, rales, or rhonchi Abdominal: Soft. Non-tender, non-distended, bowel sounds are normal, no masses, organomegaly, or guarding present.  GU: no CVA tenderness Ext: no edema and no cyanosis, pulses palpable bilaterally  Neurological: A&O x3, Strenght is normal and symmetric bilaterally, cranial nerve II-XII  are grossly intact, no focal motor deficit, sensory intact to light touch bilaterally.    Labs on Admission:  Results for orders placed during the hospital encounter of 03/03/12  (from the past 48 hour(s))  CBC     Status: Abnormal   Collection Time   03/03/12 11:49 PM      Component Value Range Comment   WBC 5.3  4.0 - 10.5 (K/uL)    RBC 3.95  3.87 - 5.11 (MIL/uL)    Hemoglobin 8.2 (*) 12.0 - 15.0 (g/dL)    HCT 04.5 (*) 40.9 - 46.0 (%)    MCV 64.8 (*) 78.0 - 100.0 (fL)    MCH 20.8 (*) 26.0 - 34.0 (pg)    MCHC 32.0  30.0 - 36.0 (g/dL)    RDW 81.1 (*) 91.4 - 15.5 (%)    Platelets 339  150 - 400 (K/uL)   DIFFERENTIAL     Status: Normal   Collection Time   03/03/12 11:49 PM      Component Value Range Comment   Neutrophils Relative 73  43 - 77 (%)    Lymphocytes Relative 19  12 - 46 (%)    Monocytes Relative 7  3 - 12 (%)    Eosinophils Relative 1  0 - 5 (%)    Basophils Relative 0  0 - 1 (%)    Neutro Abs 3.8  1.7 - 7.7 (K/uL)    Lymphs Abs 1.0  0.7 - 4.0 (K/uL)    Monocytes Absolute 0.4  0.1 - 1.0 (K/uL)    Eosinophils Absolute 0.1  0.0 - 0.7 (K/uL)    Basophils Absolute 0.0  0.0 - 0.1 (K/uL)    RBC Morphology TARGET CELLS     COMPREHENSIVE METABOLIC PANEL     Status: Abnormal   Collection Time   03/03/12 11:49 PM      Component Value Range Comment   Sodium 136  135 - 145 (mEq/L)    Potassium 3.9  3.5 - 5.1 (mEq/L)    Chloride 101  96 - 112 (mEq/L)    CO2 20  19 - 32 (mEq/L)    Glucose, Bld 99  70 - 99 (mg/dL)    BUN 10  6 - 23 (mg/dL)    Creatinine, Ser 7.82  0.50 - 1.10 (mg/dL)    Calcium 9.5  8.4 - 10.5 (mg/dL)    Total Protein 8.4 (*) 6.0 - 8.3 (g/dL)    Albumin 4.0  3.5 - 5.2 (g/dL)    AST 41 (*) 0 - 37 (U/L)    ALT 18  0 - 35 (U/L)    Alkaline Phosphatase 80  39 - 117 (U/L)    Total Bilirubin 0.6  0.3 - 1.2 (mg/dL)    GFR calc non Af Amer >90  >90 (mL/min)    GFR calc Af Amer >90  >90 (mL/min)   URINALYSIS, ROUTINE W REFLEX MICROSCOPIC     Status: Abnormal   Collection Time   03/04/12  3:08 AM      Component Value Range Comment   Color, Urine RED (*) YELLOW  BIOCHEMICALS MAY BE AFFECTED BY COLOR   APPearance CLOUDY (*) CLEAR     Specific  Gravity, Urine 1.009  1.005 - 1.030     pH 5.5  5.0 - 8.0     Glucose, UA NEGATIVE  NEGATIVE (mg/dL)    Hgb urine dipstick LARGE (*) NEGATIVE     Bilirubin Urine MODERATE (*) NEGATIVE     Ketones,  ur 40 (*) NEGATIVE (mg/dL)    Protein, ur 454 (*) NEGATIVE (mg/dL)    Urobilinogen, UA 0.2  0.0 - 1.0 (mg/dL)    Nitrite NEGATIVE  NEGATIVE     Leukocytes, UA LARGE (*) NEGATIVE    PREGNANCY, URINE     Status: Normal   Collection Time   03/04/12  3:08 AM      Component Value Range Comment   Preg Test, Ur NEGATIVE  NEGATIVE    URINE MICROSCOPIC-ADD ON     Status: Abnormal   Collection Time   03/04/12  3:08 AM      Component Value Range Comment   Squamous Epithelial / LPF FEW (*) RARE     WBC, UA 21-50  <3 (WBC/hpf)    RBC / HPF TOO NUMEROUS TO COUNT  <3 (RBC/hpf)    Bacteria, UA RARE  RARE    URINE RAPID DRUG SCREEN (HOSP PERFORMED)     Status: Normal   Collection Time   03/04/12  3:09 AM      Component Value Range Comment   Opiates NONE DETECTED  NONE DETECTED     Cocaine NONE DETECTED  NONE DETECTED     Benzodiazepines NONE DETECTED  NONE DETECTED     Amphetamines NONE DETECTED  NONE DETECTED     Tetrahydrocannabinol NONE DETECTED  NONE DETECTED     Barbiturates NONE DETECTED  NONE DETECTED      Radiological Exams on Admission: Ct Head Wo Contrast  03/04/2012  *RADIOLOGY REPORT*  Clinical Data: Altered mental status.  CT HEAD WITHOUT CONTRAST  Technique:  Contiguous axial images were obtained from the base of the skull through the vertex without contrast.  Comparison: None.  Findings: No acute intracranial hemorrhage.  No focal mass lesion. No CT evidence of acute infarction.   No midline shift or mass effect.  No hydrocephalus.  Basilar cisterns are patent.  IMPRESSION: Normal head CT.  Original Report Authenticated By: Genevive Bi, M.D.      Assessment/Plan Psychosis: As per psych. Pyuria: continue ciprofloxacin for 7 days given her immunosuppressive state, follow urine  culture and adjust antibiotics accordingly. HIV: Patient was recently seen by Dr. Orvan Falconer in the clinic and she was prescribedTruvada, Prezista and Norvir  Along with prophylactic therapy with dapsone, azithromycin and fluconazole. Please reconcile medication and continue those medications as prescribed. Patient will need to follow with Dr. Orvan Falconer as an outpatient after she did discharge from the psychiatric facility. Patient neurological exam is intact, she had no fever or leukocytosis. CT scan of the head was unremarkable. Discussed with Dr. Orvan Falconer over the phone, there is no indication to obtain an MRI brain at this time. Chronic anemia: Hemoglobin is stable, I will add anemia panel to her blood work drawn today, please follow results. further workup can be done as an outpatient. Thank you for the consultation, patient is medically stable for placement into a psychiatric facility. Will sign off, please call for any questions. Time Spent on Admission: Approximately 40 minutes  Maanya Hippert 03/04/2012, 1:47 PM

## 2012-03-04 NOTE — BH Assessment (Signed)
Faxed hospitalist consult to [facility] for review.  Pt will need a bed on the acute unit (400 hall) and there are none available. Pt will be reviewed for admission when a bed becomes available.  Steward Ros 03/04/2012 9:19 PM

## 2012-03-04 NOTE — Progress Notes (Signed)
Patient ID: [name], female   DOB: December 10, 1976, 36 y.o.   MRN: [id number]  Pt in ED at [facility].  RN will notify Dr. Cliffton Asters.  Will refer to [facility].

## 2012-03-04 NOTE — ED Notes (Signed)
Pt being very cooperative at this time.  Did not want any lunch to eat but did eat crackers and drink juice.  Sitter remains at bedside.  Spoke with pt's sister and updated on plan of care.  Pt has been medically cleared for admission to [facility]

## 2012-03-04 NOTE — ED Notes (Signed)
Tele-psych complete; patient noted to be anxious/aggitaed.  Patient stammering on purpose, repeating most every sentence she hears; patient is cooperative and follows commands; no threat noted to staff members; lunch tray ordered.

## 2012-03-04 NOTE — ED Notes (Signed)
Patient sister contact: Marti Sleigh [phone]

## 2012-03-04 NOTE — BH Assessment (Signed)
Assessment Note   [name] is an 36 y.o. female that presented to [facility] with her family per her sister, Marti Sleigh, "because of her behavior."  In the last three days, per pt, she has not slept or eaten.  "On Sunday, she was fine.  Then on Tuesday, pt  was contacted by her family members because of her bizarre and unexplainable behavior."  Pt was out on the porch prophesizing and preaching.  Pt was not oriented and was speaking tangentially with rapid, pressured speech.  Per her family, pt has numerous stressors, including financial concerns, but has no prior psychiatric history.  Pt has never seen a Psychiatrist or therapist, never presented with any previous mental health problems, no substance abuse history, and no previous SI or HI.  While in the ED, pt became agitated and threatening and ended up receiving 20mg  of Geodon and fell asleep.  Pt slept six hours and was awake when Clinical research associate presented.  However, pt was guarded and did not provide much information for the assessment because "haven't you read all of this in the computer already."  Pt wanted to listen to the television and dismissed writer without much interaction.  Writer spoke to sister for many of the assessment questions.  Felecia said that she can be contacted at any time for additional information.  Dr. And nursing staff notified and agreeable that a Telepsych would be the best option and can be completed to determine next step.    Axis I: Psychotic Disorder NOS Axis II: Deferred Axis III:  Past Medical History  Diagnosis Date  . HIV positive    Axis IV: other psychosocial or environmental problems and problems related to social environment Axis V: 21-30 behavior considerably influenced by delusions or hallucinations OR serious impairment in judgment, communication OR inability to function in almost all areas  Past Medical History:  Past Medical History  Diagnosis Date  . HIV positive     Past Surgical History    Procedure Date  . Thyroid surgery   . Tubal ligation     Family History: History reviewed. No pertinent family history.  Social History:  reports that she has never smoked. She has never used smokeless tobacco. She reports that she drinks alcohol. She reports that she does not use illicit drugs.  Additional Social History:    Allergies:  Allergies  Allergen Reactions  . Bactrim Itching  . Orange Fruit Itching  . Peanut-Containing Drug Products Hives  . Shellfish Allergy Hives    Home Medications:  Medications Prior to Admission  Medication Dose Route Frequency Provider Last Rate Last Dose  . ciprofloxacin (CIPRO) tablet 500 mg  500 mg Oral Once Geoffery Lyons, MD   500 mg at 03/04/12 0701  . ziprasidone (GEODON) injection 20 mg  20 mg Intramuscular Once Geoffery Lyons, MD   20 mg at 03/03/12 2349   Medications Prior to Admission  Medication Sig Dispense Refill  . azithromycin (ZITHROMAX) 600 MG tablet Take 2 tablets (1,200 mg total) by mouth once a week.  10 tablet  11  . fluconazole (DIFLUCAN) 100 MG tablet Take 1 tablet (100 mg total) by mouth once a week.  5 tablet  11    OB/GYN Status:  Patient's last menstrual period was 02/10/2012.  General Assessment Data Location of Assessment: [facility] Living Arrangements: Children Can pt return to current living arrangement?: Yes Admission Status: Involuntary Is patient capable of signing voluntary admission?: No Transfer from: Acute Hospital Referral Source: Self/Family/Friend  Education Status Is patient currently in school?: No  Risk to self Suicidal Ideation: No Suicidal Intent: No Is patient at risk for suicide?: No Suicidal Plan?: No Access to Means: No What has been your use of drugs/alcohol within the last 12 months?: none noted Previous Attempts/Gestures: No How many times?: 0  Other Self Harm Risks: 0 Triggers for Past Attempts: Unpredictable Intentional Self Injurious Behavior: None Family Suicide History:  No Recent stressful life event(s): Conflict (Comment);Turmoil (Comment);Financial Problems Persecutory voices/beliefs?: No Depression: No Depression Symptoms: Insomnia;Feeling angry/irritable;Loss of interest in usual pleasures Substance abuse history and/or treatment for substance abuse?: No Suicide prevention information given to non-admitted patients: Not applicable  Risk to Others Homicidal Ideation: No Thoughts of Harm to Others: No Current Homicidal Intent: No Current Homicidal Plan: No Access to Homicidal Means: No Identified Victim: n/a History of harm to others?: No Assessment of Violence: None Noted Violent Behavior Description: none Does patient have access to weapons?: No Criminal Charges Pending?: No Does patient have a court date: No  Psychosis Hallucinations: None noted Delusions: Grandiose;Unspecified  Mental Status Report Appear/Hygiene: Disheveled;Poor hygiene Eye Contact: Fair Motor Activity: Unremarkable Speech: Argumentative;Soft;Tangential Level of Consciousness: Quiet/awake;Irritable Mood: Anxious;Angry;Suspicious;Irritable;Preoccupied;Worthless, low self-esteem Affect: Anxious;Blunted;Inconsistent with thought content;Sullen Anxiety Level: Severe Thought Processes: Irrelevant;Tangential;Flight of Ideas Judgement: Impaired Orientation: Person;Place;Time;Situation Obsessive Compulsive Thoughts/Behaviors: Severe  Cognitive Functioning Concentration: Decreased Memory: Recent Impaired;Remote Impaired IQ: Average Insight: Poor Impulse Control: Poor Appetite: Poor Weight Loss: 0  Weight Gain: 0  Sleep: Decreased Total Hours of Sleep:  (less than six hours in three days or more) Vegetative Symptoms: None  Prior Inpatient Therapy Prior Inpatient Therapy: No Prior Therapy Dates: n/a Prior Therapy Facilty/Provider(s): n/a Reason for Treatment: n/a  Prior Outpatient Therapy Prior Outpatient Therapy: No Prior Therapy Dates: n/a Prior Therapy  Facilty/Provider(s): n/a Reason for Treatment: n/a          Abuse/Neglect Assessment (Assessment to be complete while patient is alone) Physical Abuse: Denies Verbal Abuse: Denies Sexual Abuse: Denies Exploitation of patient/patient's resources: Denies Self-Neglect: Denies Values / Beliefs Cultural Requests During Hospitalization: None Spiritual Requests During Hospitalization: None Consults Spiritual Care Consult Needed: No Social Work Consult Needed: No Merchant navy officer (For Healthcare) Advance Directive: Patient does not have advance directive    Additional Information 1:1 In Past 12 Months?: No CIRT Risk: Yes Elopement Risk: Yes Does patient have medical clearance?: Yes     Disposition:  Disposition Disposition of Patient: Inpatient treatment program Type of inpatient treatment program: Adult  On Site Evaluation by:   Reviewed with Physician:     Angelica Ran 03/04/2012 8:24 AM

## 2012-03-04 NOTE — ED Notes (Signed)
Patient continue to talk constantly and she would stop for few seconds when told her to listen and she will continue to talk without any meaning. Patient sister stated that the patient has no psych history. She stated that patient still is grieving about her mother who died 2 years ago, also she lost her job lately and had her lights shut off today. She stated that patient has not been sleeping in 2 days.

## 2012-03-04 NOTE — ED Provider Notes (Signed)
Patient initially seen for flight of ideas and manic behavior. She is known HIV positive.  Stable pending disposition or transferred to [facility] behavioral health.   Patient did have a normal head CT. During my shift. We have also requested a medicine consult. [facility]. Telepsych recommended. Psych placement after fully medically clear.    Deni Lefever A. Patrica Duel, MD 03/04/12 1610

## 2012-03-04 NOTE — ED Notes (Signed)
Contact information--- Haywood Filler [phone] sister                                       Meredith Pel  [phone]  Sister

## 2012-03-04 NOTE — ED Notes (Signed)
Patient asleep, resting.

## 2012-03-04 NOTE — ED Notes (Signed)
Patient noted still to be agitated and repeating sentences heard on TV. Dr. Patrica Duel notified and will order medication to help relax patient.

## 2012-03-04 NOTE — ED Notes (Signed)
Patient resting with eyes closed, resp even unlabored.

## 2012-03-04 NOTE — ED Notes (Signed)
Telepsych in progress sitter at bedside. 

## 2012-03-05 MED ORDER — LORAZEPAM 1 MG PO TABS
2.0000 mg | ORAL_TABLET | ORAL | Status: DC | PRN
  Administered 2012-03-06 – 2012-03-07 (×2): 2 mg via ORAL
  Filled 2012-03-05 (×2): qty 2

## 2012-03-05 MED ORDER — ONDANSETRON HCL 8 MG PO TABS: 4.0000 mg | ORAL_TABLET | Freq: Three times a day (TID) | ORAL | Status: DC | PRN

## 2012-03-05 MED ORDER — IBUPROFEN 200 MG PO TABS: 600.0000 mg | ORAL_TABLET | Freq: Three times a day (TID) | ORAL | Status: DC | PRN

## 2012-03-05 MED ORDER — ALUM & MAG HYDROXIDE-SIMETH 200-200-20 MG/5ML PO SUSP: 30.0000 mL | ORAL | Status: DC | PRN

## 2012-03-05 MED ORDER — ZIPRASIDONE MESYLATE 20 MG IM SOLR
20.0000 mg | Freq: Two times a day (BID) | INTRAMUSCULAR | Status: DC | PRN
  Administered 2012-03-05: 20 mg via INTRAMUSCULAR
  Filled 2012-03-05: qty 20

## 2012-03-05 MED ORDER — ZOLPIDEM TARTRATE 5 MG PO TABS
5.0000 mg | ORAL_TABLET | Freq: Every evening | ORAL | Status: DC | PRN
  Administered 2012-03-06 – 2012-03-07 (×2): 5 mg via ORAL
  Filled 2012-03-05 (×2): qty 1

## 2012-03-05 MED ORDER — ACETAMINOPHEN 325 MG PO TABS: 650.0000 mg | ORAL_TABLET | ORAL | Status: DC | PRN

## 2012-03-05 MED ORDER — CIPROFLOXACIN HCL 500 MG PO TABS
500.0000 mg | ORAL_TABLET | Freq: Two times a day (BID) | ORAL | Status: DC
  Administered 2012-03-05 – 2012-03-08 (×6): 500 mg via ORAL
  Filled 2012-03-05 (×7): qty 1

## 2012-03-05 NOTE — ED Notes (Signed)
Family at bedside with pt. Pt  excited to see family. Will continue to monitor.

## 2012-03-05 NOTE — ED Notes (Signed)
PT laying in bed speaking in run-on sentences to sitter.  Pt is pleasant and able to respond appropriately to questions.

## 2012-03-05 NOTE — ED Notes (Signed)
Pt refusing any ativan at this time

## 2012-03-05 NOTE — ED Provider Notes (Signed)
[facility] hospitalist Dr Baltazar Najjar has done a medicine consult and feels patient does not have any acute medical cause of her present symptoms and feel she is medically stable to go to a psychiatric facility specifically she does not feel she has an HIV related cerebritis or any other intracranial problem. Irving Burton from [facility] aware of this and is going to pursue getting her placed in a psychiatric facility  Ward Givens, MD 03/05/12 [phone]

## 2012-03-05 NOTE — ED Notes (Signed)
Breakfast tray ordered 

## 2012-03-05 NOTE — ED Notes (Signed)
Dinner tray ordered, regular nonsharp 

## 2012-03-05 NOTE — ED Notes (Signed)
Pt resting with eyes closed. Sitter remains at bedside.  Spoke with pt's sister again earlier tonight again updated on pt's plan of care.

## 2012-03-05 NOTE — ED Notes (Signed)
Dinner tray delivered.

## 2012-03-05 NOTE — ED Notes (Signed)
ACT team at bedside. IVC papers came via [employer]. In pts paperwork slot. Pt is now calm and resting post Geodon administration.  Will continue to monitor.
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
## 2012-03-05 NOTE — ED Notes (Signed)
Pt continues to rest with eyes closed, sitter remains at bedside.

## 2012-03-05 NOTE — ED Notes (Signed)
Spoke with Social Work about following up on pts going to [facility]. They stated they spoke with ACT team and stated that pt is not going to [facility]. Pt is waiting on bed placement. Family made aware. Will continue to monitor.

## 2012-03-06 LAB — URINE CULTURE

## 2012-03-06 NOTE — ED Provider Notes (Signed)
Pt apparently did fine overnight, is manic, reports to me as she is speaking to me, she is also speaking to God.  I reviewed prior ED notes.  Pt has breakfast tray.  Pt's case being reviewed at  Various locations.    Gavin Pound. Oletta Lamas, MD 03/06/12 [phone]

## 2012-03-07 MED ORDER — VALACYCLOVIR HCL 500 MG PO TABS
500.0000 mg | ORAL_TABLET | Freq: Two times a day (BID) | ORAL | Status: DC
  Administered 2012-03-07 – 2012-03-08 (×2): 500 mg via ORAL
  Filled 2012-03-07 (×2): qty 1

## 2012-03-07 MED ORDER — PANTOPRAZOLE SODIUM 40 MG PO TBEC
40.0000 mg | DELAYED_RELEASE_TABLET | Freq: Every day | ORAL | Status: DC
  Administered 2012-03-07 – 2012-03-08 (×2): 40 mg via ORAL
  Filled 2012-03-07 (×2): qty 1

## 2012-03-07 MED ORDER — FLUCONAZOLE 100 MG PO TABS: 100.0000 mg | ORAL_TABLET | ORAL | Status: DC

## 2012-03-07 MED ORDER — DARUNAVIR ETHANOLATE 800 MG PO TABS
800.0000 mg | ORAL_TABLET | Freq: Every day | ORAL | Status: DC
  Administered 2012-03-08: 800 mg via ORAL
  Filled 2012-03-07 (×2): qty 1

## 2012-03-07 MED ORDER — RITONAVIR 100 MG PO CAPS
100.0000 mg | ORAL_CAPSULE | Freq: Every day | ORAL | Status: DC
  Administered 2012-03-08: 100 mg via ORAL
  Filled 2012-03-07 (×2): qty 1

## 2012-03-07 MED ORDER — DAPSONE 100 MG PO TABS
100.0000 mg | ORAL_TABLET | Freq: Every day | ORAL | Status: DC
  Administered 2012-03-07 – 2012-03-08 (×2): 100 mg via ORAL
  Filled 2012-03-07 (×2): qty 1

## 2012-03-07 MED ORDER — EMTRICITABINE-TENOFOVIR DF 200-300 MG PO TABS
1.0000 | ORAL_TABLET | Freq: Every day | ORAL | Status: DC
  Administered 2012-03-08: 1 via ORAL
  Filled 2012-03-07 (×2): qty 1

## 2012-03-07 MED ORDER — AZITHROMYCIN 600 MG PO TABS: 1200.0000 mg | ORAL_TABLET | ORAL | Status: DC

## 2012-03-07 NOTE — ED Provider Notes (Signed)
BP 126/78  Pulse 93  Temp(Src) 97.2 F (36.2 C) (Oral)  Resp 16  SpO2 100%  LMP 02/10/2012  Patient seen and evaluated by me. No complaints at this time. Awaiting placement.   Forbes Cellar, MD 03/07/12 [phone]

## 2012-03-07 NOTE — ED Notes (Signed)
Pt and sitter walking to warm up pts breakfast

## 2012-03-07 NOTE — ED Provider Notes (Signed)
BP 128/92  Pulse 89  Temp(Src) 97.6 F (36.4 C) (Oral)  Resp 16  SpO2 100%  LMP 02/10/2012  Pt non compliant with HIV meds but now requesting them as well as her HSV prophylactic medication. Nursing staff to have pharmacy tech call her pharmacy to verify her medications and dosing.  Forbes Cellar, MD 03/07/12 [phone]

## 2012-03-07 NOTE — ED Notes (Signed)
I gave the patient two packs of teddy grahams and two packs of animal crackers.

## 2012-03-07 NOTE — ED Notes (Signed)
Pt is involuntary, paperwork in chart. Pt has had psych consult completed. Pt is waiting on placement at [facility].

## 2012-03-07 NOTE — ED Notes (Signed)
Patient currently sleeping, per sitter patient stated she did not want to be woken up until 3pm for vital signs. Told sitter to tell patient vital signs will be done around 3:15pm.

## 2012-03-07 NOTE — ED Notes (Signed)
Pt requested not to be a confidential pt. Pt voiced this request in front of NS/NT and Sitter. RN Magda Paganini notified and aware.
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
## 2012-03-07 NOTE — ED Notes (Signed)
Spoke with Sunny Schlein, patients sister. Will place patient on as XXX. Family requests that only immediate family be allowed back to see patient. Requests that password question be what is pts fathers middle name answer is he does not have one. Registration made aware.

## 2012-03-08 NOTE — ED Notes (Signed)
I gave the patient four warm blankets and a cup of hot water.

## 2012-03-08 NOTE — ED Notes (Signed)
Pt resting in stretcher in NAD, respirations even and unlabored.  Sitter at bedside. 

## 2012-03-08 NOTE — Discharge Instructions (Signed)
Return if her condition worsens for any reason, or see Dr. Orvan Falconer. Make sure that you take all of your medications as prescribed and did not use alcohol or illegal drugs. You can see [facility] mental health if you wish to see a psychiatrist. If you have any thought of harming yourself or others call 911 immediately

## 2012-03-08 NOTE — ED Notes (Signed)
Pt laying in stretcher snoring.  Pt in NAD, respirations even and unlabored.

## 2012-03-08 NOTE — ED Notes (Signed)
Family at bedside. 

## 2012-03-08 NOTE — ED Notes (Addendum)
Pt sleeping no distress.  Sitter in room

## 2012-03-08 NOTE — ED Notes (Signed)
Meal received 

## 2012-03-08 NOTE — ED Provider Notes (Addendum)
Sleeping easily arousable. Pt reportedly refusing to take psychiatric medications. telpsych consulted to adjust medication regimen  Doug Sou, MD 03/08/12 [phone]  Patient evaluated by specialists on call he suggests that patient can be discharged home. At 2:15 PM patient alert ambulatory Glasgow Coma Score 15 appropriate not delusional or psychotic. She is referred to the [facility] She is also referred back toDr. Orvan Falconer Diagnosis psychosis-resolved  Doug Sou, MD 03/08/12 [phone]

## 2012-03-08 NOTE — ED Notes (Signed)
Report received, assumed care.  

## 2012-03-10 ENCOUNTER — Emergency Department (HOSPITAL_COMMUNITY): Payer: Medicaid Other

## 2012-03-10 ENCOUNTER — Inpatient Hospital Stay (HOSPITAL_COMMUNITY)
Admission: EM | Admit: 2012-03-10 | Discharge: 2012-03-17 | DRG: 975 | Disposition: A | Discharge status: Home / Self Care | Payer: Medicaid Other | Attending: Internal Medicine | Admitting: Internal Medicine

## 2012-03-10 ENCOUNTER — Encounter (HOSPITAL_COMMUNITY): Payer: Self-pay | Admitting: *Deleted

## 2012-03-10 DIAGNOSIS — Z91010 Allergy to peanuts: Secondary | ICD-10-CM

## 2012-03-10 DIAGNOSIS — Z79899 Other long term (current) drug therapy: Secondary | ICD-10-CM

## 2012-03-10 DIAGNOSIS — F23 Brief psychotic disorder: Secondary | ICD-10-CM | POA: Diagnosis present

## 2012-03-10 DIAGNOSIS — F05 Delirium due to known physiological condition: Secondary | ICD-10-CM

## 2012-03-10 DIAGNOSIS — E876 Hypokalemia: Secondary | ICD-10-CM | POA: Diagnosis present

## 2012-03-10 DIAGNOSIS — D638 Anemia in other chronic diseases classified elsewhere: Secondary | ICD-10-CM | POA: Diagnosis present

## 2012-03-10 DIAGNOSIS — Z91013 Allergy to seafood: Secondary | ICD-10-CM

## 2012-03-10 DIAGNOSIS — Z91018 Allergy to other foods: Secondary | ICD-10-CM

## 2012-03-10 DIAGNOSIS — B20 Human immunodeficiency virus [HIV] disease: Secondary | ICD-10-CM | POA: Diagnosis present

## 2012-03-10 DIAGNOSIS — E871 Hypo-osmolality and hyponatremia: Secondary | ICD-10-CM | POA: Diagnosis present

## 2012-03-10 DIAGNOSIS — F29 Unspecified psychosis not due to a substance or known physiological condition: Secondary | ICD-10-CM

## 2012-03-10 DIAGNOSIS — Z888 Allergy status to other drugs, medicaments and biological substances status: Secondary | ICD-10-CM

## 2012-03-10 DIAGNOSIS — D649 Anemia, unspecified: Secondary | ICD-10-CM

## 2012-03-10 HISTORY — DX: Pneumonia, unspecified organism: J18.9

## 2012-03-10 HISTORY — DX: Iron deficiency anemia, unspecified: D50.9

## 2012-03-10 HISTORY — DX: Unspecified viral hepatitis B without hepatic coma: B19.10

## 2012-03-10 HISTORY — DX: Brief psychotic disorder: F23

## 2012-03-10 HISTORY — DX: Patient's other noncompliance with medication regimen: Z91.14

## 2012-03-10 HISTORY — DX: Patient's other noncompliance with medication regimen for other reason: Z91.148

## 2012-03-10 HISTORY — DX: Tubulo-interstitial nephritis, not specified as acute or chronic: N12

## 2012-03-10 LAB — DIFFERENTIAL
Basophils Absolute: 0 10*3/uL (ref 0.0–0.1)
Lymphs Abs: 1.1 10*3/uL (ref 0.7–4.0)
Monocytes Absolute: 0.6 10*3/uL (ref 0.1–1.0)
Monocytes Relative: 10 % (ref 3–12)
Neutro Abs: 4.3 10*3/uL (ref 1.7–7.7)

## 2012-03-10 LAB — COMPREHENSIVE METABOLIC PANEL
Albumin: 4 g/dL (ref 3.5–5.2)
Alkaline Phosphatase: 76 U/L (ref 39–117)
BUN: 10 mg/dL (ref 6–23)
Chloride: 92 mEq/L — ABNORMAL LOW (ref 96–112)
Creatinine, Ser: 0.85 mg/dL (ref 0.50–1.10)
GFR calc Af Amer: >90 mL/min (ref >90)
Glucose, Bld: 98 mg/dL (ref 70–99)
Potassium: 3.8 mEq/L (ref 3.5–5.1)
Total Bilirubin: 0.7 mg/dL (ref 0.3–1.2)

## 2012-03-10 LAB — CBC
HCT: 23.5 % — ABNORMAL LOW (ref 36.0–46.0)
MCH: 21.3 pg — ABNORMAL LOW (ref 26.0–34.0)
MCV: 64.9 fL — ABNORMAL LOW (ref 78.0–100.0)
Platelets: 215 10*3/uL (ref 150–400)
RDW: 18.2 % — ABNORMAL HIGH (ref 11.5–15.5)

## 2012-03-10 LAB — PREGNANCY, URINE: Preg Test, Ur: NEGATIVE

## 2012-03-10 LAB — RAPID URINE DRUG SCREEN, HOSP PERFORMED: Barbiturates: NOT DETECTED

## 2012-03-10 LAB — ACETAMINOPHEN LEVEL: Acetaminophen (Tylenol), Serum: <15 ug/mL (ref 10–30)

## 2012-03-10 LAB — URINALYSIS, ROUTINE W REFLEX MICROSCOPIC
Glucose, UA: NEGATIVE mg/dL
Leukocytes, UA: NEGATIVE
Nitrite: NEGATIVE
Specific Gravity, Urine: 1.009 (ref 1.005–1.030)
pH: 6 (ref 5.0–8.0)

## 2012-03-10 LAB — OSMOLALITY, URINE: Osmolality, Ur: 109 mOsm/kg — ABNORMAL LOW (ref 390–1090)

## 2012-03-10 LAB — SALICYLATE LEVEL: Salicylate Lvl: <2 mg/dL — ABNORMAL LOW (ref 2.8–20.0)

## 2012-03-10 LAB — OSMOLALITY: Osmolality: 273 mOsm/kg — ABNORMAL LOW (ref 275–300)

## 2012-03-10 LAB — SODIUM, URINE, RANDOM: Sodium, Ur: 13 mEq/L

## 2012-03-10 MED ORDER — DARUNAVIR ETHANOLATE 800 MG PO TABS
800.0000 mg | ORAL_TABLET | Freq: Every day | ORAL | Status: DC
  Administered 2012-03-10: 800 mg via ORAL
  Filled 2012-03-10 (×2): qty 1

## 2012-03-10 MED ORDER — HALOPERIDOL 5 MG PO TABS
5.0000 mg | ORAL_TABLET | Freq: Once | ORAL | Status: AC
  Administered 2012-03-10: 5 mg via ORAL
  Filled 2012-03-10: qty 1

## 2012-03-10 MED ORDER — ONDANSETRON HCL 4 MG PO TABS: 4.0000 mg | ORAL_TABLET | Freq: Four times a day (QID) | ORAL | Status: DC | PRN

## 2012-03-10 MED ORDER — RITONAVIR 100 MG PO CAPS
100.0000 mg | ORAL_CAPSULE | Freq: Every day | ORAL | Status: DC
  Administered 2012-03-10: 100 mg via ORAL
  Filled 2012-03-10 (×2): qty 1

## 2012-03-10 MED ORDER — SODIUM CHLORIDE 0.9 % IV SOLN
INTRAVENOUS | Status: AC
  Administered 2012-03-10 – 2012-03-11 (×2): via INTRAVENOUS

## 2012-03-10 MED ORDER — EMTRICITABINE-TENOFOVIR DF 200-300 MG PO TABS
1.0000 | ORAL_TABLET | Freq: Every day | ORAL | Status: DC
  Administered 2012-03-10 – 2012-03-17 (×7): 1 via ORAL
  Filled 2012-03-10 (×9): qty 1

## 2012-03-10 MED ORDER — AZITHROMYCIN 600 MG PO TABS
1200.0000 mg | ORAL_TABLET | ORAL | Status: DC
  Administered 2012-03-10: 1200 mg via ORAL
  Filled 2012-03-10 (×2): qty 2

## 2012-03-10 MED ORDER — DAPSONE 100 MG PO TABS
100.0000 mg | ORAL_TABLET | Freq: Every day | ORAL | Status: DC
  Administered 2012-03-10 – 2012-03-17 (×7): 100 mg via ORAL
  Filled 2012-03-10 (×8): qty 1

## 2012-03-10 MED ORDER — ACETAMINOPHEN 325 MG PO TABS
650.0000 mg | ORAL_TABLET | Freq: Four times a day (QID) | ORAL | Status: DC | PRN
  Administered 2012-03-12 – 2012-03-16 (×3): 650 mg via ORAL
  Filled 2012-03-10 (×3): qty 2

## 2012-03-10 MED ORDER — ENOXAPARIN SODIUM 40 MG/0.4ML ~~LOC~~ SOLN
40.0000 mg | SUBCUTANEOUS | Status: DC
  Administered 2012-03-10 – 2012-03-13 (×3): 40 mg via SUBCUTANEOUS
  Filled 2012-03-10 (×8): qty 0.4

## 2012-03-10 MED ORDER — FLUCONAZOLE 100 MG PO TABS
100.0000 mg | ORAL_TABLET | ORAL | Status: DC
  Administered 2012-03-10: 100 mg via ORAL
  Filled 2012-03-10 (×2): qty 1

## 2012-03-10 MED ORDER — ONDANSETRON HCL 4 MG/2ML IJ SOLN: 4.0000 mg | Freq: Four times a day (QID) | INTRAMUSCULAR | Status: DC | PRN

## 2012-03-10 MED ORDER — PANTOPRAZOLE SODIUM 40 MG PO TBEC
40.0000 mg | DELAYED_RELEASE_TABLET | Freq: Every day | ORAL | Status: DC
  Administered 2012-03-10 – 2012-03-17 (×7): 40 mg via ORAL
  Filled 2012-03-10 (×7): qty 1

## 2012-03-10 MED ORDER — WHITE PETROLATUM GEL
Status: AC
  Filled 2012-03-10: qty 5

## 2012-03-10 MED ORDER — ACETAMINOPHEN 650 MG RE SUPP: 650.0000 mg | Freq: Four times a day (QID) | RECTAL | Status: DC | PRN

## 2012-03-10 MED ORDER — VALACYCLOVIR HCL 500 MG PO TABS
500.0000 mg | ORAL_TABLET | Freq: Two times a day (BID) | ORAL | Status: DC
  Administered 2012-03-10 – 2012-03-17 (×12): 500 mg via ORAL
  Filled 2012-03-10 (×16): qty 1

## 2012-03-10 NOTE — ED Notes (Signed)
Family took belongings home with them.

## 2012-03-10 NOTE — H&P (Signed)
Hospital Admission Note Date: 03/10/2012  Patient name: [name] Medical record number: [id number] Date of birth: 1976-03-20 Age: 36 y.o. Gender: female PCP: Cliffton Asters, MD, MD  Medical Service: Sharen Heck  Attending physician:   Dr. Rogelia Boga 1st Contact:   Dr. Dorise Hiss Pager: [phone] 2nd Contact:   Dr. Dorthula Rue Pager: [phone] After 5 pm or weekends: 1st Contact:      Pager: [phone] 2nd Contact:      Pager: [phone]  Chief Complaint: Acute psychosis, hyponatremia  History of Present Illness: The patient is a 36 YO female who originally presented to the ED approximately one week ago with her sister for acute psychosis. She was diagnosed with acute psychosis via telepsych. She was observed in the ED for several days and supposedly the psychosis resolved although note from her sister states that she never witnessed the change. She was brought back to the ED today by her sister for the same problem of acute psychosis. She is not able to provide history and this was gleamed from chart review of ED notes that she had been gesturing and speaking incoherently, saying words clearly but none that make sense or connect to one another. Per recent notes, patient with increased emotional stress recently. No hx of psychiatric disorders prior to last week. She does have past medical history significant for HIV that was untreated from 2006 to recently. She was restarted on HAART mid February and dapsone, valtrex, diflucan, and azithromycin for prophylaxis. She lost insurance and was lost to follow up until then. Last CD4 at that time was < 10 with viral load of 700K. During medical evaluation for transfer to behavioral health it was noted that her sodium was 130 and she was admitted for medical clearance. She is unable to provide any history at this time as she goes on about random things and says a lot of things like "saturday has anointed me because it is in the future and has not happened yet and..." .    Meds: Medications Prior to Admission  Medication Dose Route Frequency Provider Last Rate Last Dose  . 0.9 %  sodium chloride infusion   Intravenous Continuous Elyse Jarvis, MD      . acetaminophen (TYLENOL) tablet 650 mg  650 mg Oral Q6H PRN Elyse Jarvis, MD       Or  . acetaminophen (TYLENOL) suppository 650 mg  650 mg Rectal Q6H PRN Elyse Jarvis, MD      . azithromycin (ZITHROMAX) tablet 1,200 mg  1,200 mg Oral Weekly Elyse Jarvis, MD      . dapsone tablet 100 mg  100 mg Oral Daily Elyse Jarvis, MD      . Darunavir Ethanolate (PREZISTA) tablet 800 mg  800 mg Oral Daily Elyse Jarvis, MD      . emtricitabine-tenofovir (TRUVADA) 200-300 MG per tablet 1 tablet  1 tablet Oral Daily Elyse Jarvis, MD      . enoxaparin (LOVENOX) injection 40 mg  40 mg Subcutaneous Q24H Elyse Jarvis, MD      . fluconazole (DIFLUCAN) tablet 100 mg  100 mg Oral Weekly Elyse Jarvis, MD      . haloperidol (HALDOL) tablet 5 mg  5 mg Oral Once Rise Patience, PA   5 mg at 03/10/12 1056  . ondansetron (ZOFRAN) tablet 4 mg  4 mg Oral Q6H PRN Elyse Jarvis, MD       Or  . ondansetron (ZOFRAN) injection 4 mg  4 mg Intravenous Q6H PRN Megha  Sawhney, MD      . pantoprazole (PROTONIX) EC tablet 40 mg  40 mg Oral Q1200 Elyse Jarvis, MD      . ritonavir (NORVIR) capsule 100 mg  100 mg Oral Daily Elyse Jarvis, MD      . valACYclovir (VALTREX) tablet 500 mg  500 mg Oral BID Elyse Jarvis, MD       Medications Prior to Admission  Medication Sig Dispense Refill  . azithromycin (ZITHROMAX) 600 MG tablet Take 2 tablets (1,200 mg total) by mouth once a week.  10 tablet  11  . dapsone 100 MG tablet Take 100 mg by mouth daily.      . darunavir (PREZISTA) 400 MG tablet Take 800 mg by mouth daily.      Marland Kitchen emtricitabine-tenofovir (TRUVADA) 200-300 MG per tablet Take 1 tablet by mouth daily.      . fluconazole (DIFLUCAN) 100 MG tablet Take 1 tablet (100 mg total) by mouth once a week.  5 tablet  11  . omeprazole (PRILOSEC) 20  MG capsule Take 20 mg by mouth daily.      . ritonavir (NORVIR) 100 MG capsule Take 100 mg by mouth daily.      . valACYclovir (VALTREX) 500 MG tablet Take 500 mg by mouth 2 (two) times daily.        Allergies: Bactrim; Orange fruit; Peanut-containing drug products; and Shellfish allergy  Past Medical History  Diagnosis Date  . HIV positive    Past Surgical History  Procedure Date  . Thyroid surgery   . Tubal ligation    No family history on file. History   Social History  . Marital Status: Single    Spouse Name: N/A    Number of Children: N/A  . Years of Education: N/A   Occupational History  . Not on file.   Social History Main Topics  . Smoking status: Never Smoker   . Smokeless tobacco: Never Used  . Alcohol Use: Yes  . Drug Use: No  . Sexually Active: Not on file     given condoms   Other Topics Concern  . Not on file   Social History Narrative  . No narrative on file    Review of Systems: Pertinent items are noted in HPI.  Physical Exam: Blood pressure 135/71, pulse 105, temperature 98.3 F (36.8 C), temperature source Oral, resp. rate 18, weight 143 lb 8 oz (65.091 kg), last menstrual period 02/10/2012, SpO2 98.00%. General: sitting on the edge of the bed, restless, animated, big arm movements HEENT: EOM intact Cardiac: patient was not cooperative with exam Pulm: patient was not cooperative with exam Abd: she was pushing on it repeatedly during conversation and did not appear to be tender, otherwise would not cooperate with our exam Ext: well perfused, no pedal edema noticed while she was dangling her feet over the edge of the bed Neuro: would not answer questions regarding orientation, moving all four extremities, able to talk but prefers to communicate in a mix of sign language and made up sign language   Lab results: Basic Metabolic Panel:  Basename 03/10/12 0958  NA 129*  K 3.8  CL 92*  CO2 22  GLUCOSE 98  BUN 10  CREATININE 0.85    CALCIUM 9.1  MG --  PHOS --   Liver Function Tests:  [facility] 03/10/12 0958  AST 42*  ALT 18  ALKPHOS 76  BILITOT 0.7  PROT 8.2  ALBUMIN 4.0   CBC:  Basename 03/10/12 0958  WBC 6.0  NEUTROABS 4.3  HGB 7.7*  HCT 23.5*  MCV 64.9*  PLT 215   Urine Drug Screen: Drugs of Abuse     Component Value Date/Time   LABOPIA NONE DETECTED 03/10/2012 1015   COCAINSCRNUR NONE DETECTED 03/10/2012 1015   LABBENZ NONE DETECTED 03/10/2012 1015   AMPHETMU NONE DETECTED 03/10/2012 1015   THCU NONE DETECTED 03/10/2012 1015   LABBARB NONE DETECTED 03/10/2012 1015    Alcohol Level:  Basename 03/10/12 0958  ETH <11   Urinalysis:  Basename 03/10/12 1015  COLORURINE YELLOW  LABSPEC 1.009  PHURINE 6.0  GLUCOSEU NEGATIVE  HGBUR NEGATIVE  BILIRUBINUR NEGATIVE  KETONESUR NEGATIVE  PROTEINUR NEGATIVE  UROBILINOGEN 0.2  NITRITE NEGATIVE  LEUKOCYTESUR NEGATIVE   Imaging results:  Dg Chest 2 View  03/10/2012  *RADIOLOGY REPORT*  Clinical Data: Altered mental status  CHEST - 2 VIEW  Comparison: 06/05/2010  Findings: Lungs are clear. No pleural effusion or pneumothorax.  Cardiomediastinal silhouette is within normal limits.  Visualized osseous structures are within normal limits.  IMPRESSION: Normal chest radiographs.  Original Report Authenticated By: Charline Bills, M.D.   Ct Head Wo Contrast  03/10/2012  *RADIOLOGY REPORT*  Clinical Data: 36 year old female with psychosis.  HIV positive.  CT HEAD WITHOUT CONTRAST  Technique:  Contiguous axial images were obtained from the base of the skull through the vertex without contrast.  Comparison: 03/04/2012.  Findings: Visualized orbits and scalp soft tissues are within normal limits.  Visualized paranasal sinuses and mastoids are clear.  No acute osseous abnormality identified.  Stable and normal cerebral volume.  No ventriculomegaly. Gray-white matter differentiation is within normal limits throughout the brain.  No midline shift, mass effect, or  evidence of mass lesion. No acute intracranial hemorrhage identified.  No evidence of cortically based acute infarction identified.  No suspicious intracranial vascular hyperdensity.  IMPRESSION: Stable and normal noncontrast CT appearance of the brain.  Original Report Authenticated By: Harley Hallmark, M.D.   Assessment & Plan by Problem:  Hyponatremia - Likely due to volume contraction as she looks dry on exam and probably has not been taking in good fluids at home. Will give IVF NS 150 cc/hr and see if it corrects. Also got urine osmolality and urine sodium to help determine etiology. Not likely low enough to be causing mental changes in this patient. Sodium is 129 today and was 136 about 1 week ago.   Acute psychosis - Called consult to psych and they will not come til tomorrow. She is having lots of religious utterings and talking about how people are anointed to do things. Hard to redirect and get to answer questions. This has been going on for 1 week and has not resolved. Not likely to be HIV related encephalopathy as no lesions on CT head. RPR non-reactive. No alcohol or other intoxicants in her system that were detected.   Drop in Hg level - Chronic level appears to be between 8.5 and 9.5. Would obtain FOBT but she is not cooperative at this time. Will consider ordering FOBT of any stools that she is having. Iron, B12, folate, ferritin, reticulocytes were checked about 1 week ago and suggested anemia of chronic disease and did not repeat these studies.   HIV positive - Last CD4 count < 10 with viral load 700K in mid Feb, will continue dapsone, azithromycin, diflulcan, valtrex for prophylaxis and HAART for therapy. Question whether this could be IRIS but CT scan of head is normal. But timeline is correct  for development of IRIS.   DVT ppx - lovenox  Signed: Genella Mech 03/10/2012, 4:04 PM

## 2012-03-10 NOTE — ED Notes (Signed)
Gave pt a cup of water 

## 2012-03-10 NOTE — ED Notes (Signed)
Pt is more directable when called by "Titi"

## 2012-03-10 NOTE — Progress Notes (Addendum)
Pt arrived to unit via EMS from [facility]. Pt is clearly suffering from psychosis, with flight of ideas, and rambling speech. I attempted to ask the pt simple questions, but it appears difficult for her to stay on track mentally. Safety sitter is in the pt's room, which is a camera room. Call bell within reach. Admission nurse has been paged. Internal medicine doctors have been paged. Awaiting orders.

## 2012-03-10 NOTE — ED Notes (Signed)
Pt waving at TV and security outside room. Pt constantly coughing as if she starts to choke on something. Sts she is fine, O2 sat normal, no distress. Water at bedside. Unable to assess fully due to psychotic state.

## 2012-03-10 NOTE — ED Notes (Signed)
Patient constantly moving things around at the check-in window and using excessive hand motions to answer questions.

## 2012-03-10 NOTE — ED Provider Notes (Signed)
History     CSN: [id number]  Arrival date & time 03/10/12  9629   First MD Initiated Contact with Patient 03/10/12 [phone]      Chief Complaint  Patient presents with  . Medical Clearance    (Consider location/radiation/quality/duration/timing/severity/associated sxs/prior treatment) HPI Comments: Patient brought in by her sister.  Sister reports patient has been "like this" for approximately one week.  States they were seen at [facility] and sent home.  Patient is gesturing and speaking incoherently, saying words clearly but none that make sense or connect to one another.  Per recent notes, patient with increased emotional stress recently.  No hx of psychiatric disorders prior to last week.    Patient seen on 3/19 and medically cleared,had telepsych consult diagnosis psychosis NOS, with similar presentation to today.  Patient spent several days in ED and was eventually discharged with note stating her psychosis had resolved.  Sister reports that she never saw the patient with resolved symptoms.    Patient has a history of HIV.    Patient is a 36 y.o. female presenting with mental health disorder. The history is provided by the patient, a relative and medical records. The history is limited by the condition of the patient.  Mental Health Problem The primary symptoms include bizarre behavior and disorganized speech. The current episode started 1 to 2 weeks ago.  The onset of the illness is precipitated by emotional stress and a stressful event.    Past Medical History  Diagnosis Date  . HIV positive     Past Surgical History  Procedure Date  . Thyroid surgery   . Tubal ligation     No family history on file.  History  Substance Use Topics  . Smoking status: Never Smoker   . Smokeless tobacco: Never Used  . Alcohol Use: Yes    OB History    Grav Para Term Preterm Abortions TAB SAB Ect Mult Living                  Review of Systems  Unable to perform ROS   Allergies   Bactrim; Orange fruit; Peanut-containing drug products; and Shellfish allergy  Home Medications   Current Outpatient Rx  Name Route Sig Dispense Refill  . AZITHROMYCIN 600 MG PO TABS Oral Take 2 tablets (1,200 mg total) by mouth once a week. 10 tablet 11  . DAPSONE 100 MG PO TABS Oral Take 100 mg by mouth daily.    Marland Kitchen DARUNAVIR ETHANOLATE 400 MG PO TABS Oral Take 800 mg by mouth daily.    Marland Kitchen EMTRICITABINE-TENOFOVIR 200-300 MG PO TABS Oral Take 1 tablet by mouth daily.    Marland Kitchen FLUCONAZOLE 100 MG PO TABS Oral Take 1 tablet (100 mg total) by mouth once a week. 5 tablet 11  . OMEPRAZOLE 20 MG PO CPDR Oral Take 20 mg by mouth daily.    Marland Kitchen RITONAVIR 100 MG PO CAPS Oral Take 100 mg by mouth daily.    Marland Kitchen VALACYCLOVIR HCL 500 MG PO TABS Oral Take 500 mg by mouth 2 (two) times daily.      BP 152/86  Pulse 110  Temp(Src) 98.7 F (37.1 C) (Oral)  Resp 18  SpO2 100%  LMP 02/10/2012  Physical Exam  Nursing note and vitals reviewed. Constitutional: She appears well-developed and well-nourished.  HENT:  Head: Normocephalic and atraumatic.  Neck: Neck supple.  Cardiovascular: Normal rate, regular rhythm and normal heart sounds.   Pulmonary/Chest: Breath sounds normal. No respiratory distress.  She has no wheezes. She has no rales. She exhibits no tenderness.  Abdominal: Soft. Bowel sounds are normal. There is no tenderness.  Neurological: She is alert. She exhibits normal muscle tone.  Psychiatric: Her speech is rapid and/or pressured and tangential. Cognition and memory are impaired.       Patient speaking intermittently with wild large gestures, mostly religious.  Words are largely disconnected, once responded to the question "is that a new sweatshirt?" (regarding the sweatshirt she had on) with "It will be.  I'm going to buy it today"      ED Course  Procedures (including critical care time)  Labs Reviewed  CBC - Abnormal; Notable for the following:    RBC 3.62 (*)    Hemoglobin 7.7 (*)     HCT 23.5 (*)    MCV 64.9 (*)    MCH 21.3 (*)    RDW 18.2 (*)    All other components within normal limits  COMPREHENSIVE METABOLIC PANEL - Abnormal; Notable for the following:    Sodium 129 (*)    Chloride 92 (*)    AST 42 (*)    GFR calc non Af Amer 88 (*)    All other components within normal limits  SALICYLATE LEVEL - Abnormal; Notable for the following:    Salicylate Lvl <2.0 (*)    All other components within normal limits  DIFFERENTIAL  ACETAMINOPHEN LEVEL  URINE RAPID DRUG SCREEN (HOSP PERFORMED)  URINALYSIS, ROUTINE W REFLEX MICROSCOPIC  PREGNANCY, URINE  ETHANOL   Dg Chest 2 View  03/10/2012  *RADIOLOGY REPORT*  Clinical Data: Altered mental status  CHEST - 2 VIEW  Comparison: 06/05/2010  Findings: Lungs are clear. No pleural effusion or pneumothorax.  Cardiomediastinal silhouette is within normal limits.  Visualized osseous structures are within normal limits.  IMPRESSION: Normal chest radiographs.  Original Report Authenticated By: Charline Bills, M.D.   Ct Head Wo Contrast  03/10/2012  *RADIOLOGY REPORT*  Clinical Data: 36 year old female with psychosis.  HIV positive.  CT HEAD WITHOUT CONTRAST  Technique:  Contiguous axial images were obtained from the base of the skull through the vertex without contrast.  Comparison: 03/04/2012.  Findings: Visualized orbits and scalp soft tissues are within normal limits.  Visualized paranasal sinuses and mastoids are clear.  No acute osseous abnormality identified.  Stable and normal cerebral volume.  No ventriculomegaly. Gray-white matter differentiation is within normal limits throughout the brain.  No midline shift, mass effect, or evidence of mass lesion. No acute intracranial hemorrhage identified.  No evidence of cortically based acute infarction identified.  No suspicious intracranial vascular hyperdensity.  IMPRESSION: Stable and normal noncontrast CT appearance of the brain.  Original Report Authenticated By: Harley Hallmark, M.D.      10:33 AM Patient discussed with Dr Clarene Duke.  Chart reviewed by me and by Dr Clarene Duke.  Pt recently had very low CD4 count and HIV high viral load.     1. Psychosis   2. Hyponatremia   3. Anemia   4. HIV or AIDS       MDM  Patient with history of HIV with most recently, a very low CD4 count, presents with patient was seen last week in the emergency department for the same thing and was discharged home.  Sister notes that the psychosis never resolved and patient continues to act the same way and speak illogically and tangential, largely about religious topics.  Patient found to be hyponatremic and anemic.  Given these findings and patient's  HIV status and low CD4 count, patient admitted to the outpatient clinics / infectious disease (Patient's PCP) at [facility] for further evaluation.          Dillard Cannon [place], [place] 03/10/12 1437

## 2012-03-10 NOTE — H&P (Signed)
Attempted to obtain Admission History assessment.  Pt accurately reports name, DOB; does not answer any other questions w/meaning.  Gesturing and speaking incoherently; speech clear. Elon Jester

## 2012-03-10 NOTE — ED Notes (Signed)
Sister states "she was doing this, we took her to [facility], they kept her until Sunday, she has been this way ever since, she is HIV+"; pt presents psychotic & is hyperreligious @ the moment, is difficult to keep on task with changing clothes

## 2012-03-11 ENCOUNTER — Other Ambulatory Visit: Payer: Self-pay

## 2012-03-11 LAB — BASIC METABOLIC PANEL
BUN: 7 mg/dL (ref 6–23)
CO2: 22 mEq/L (ref 19–32)
Calcium: 8.7 mg/dL (ref 8.4–10.5)
Chloride: 104 mEq/L (ref 96–112)
Creatinine, Ser: 0.72 mg/dL (ref 0.50–1.10)
Glucose, Bld: 103 mg/dL — ABNORMAL HIGH (ref 70–99)

## 2012-03-11 LAB — COMPREHENSIVE METABOLIC PANEL
ALT: 18 U/L (ref 0–35)
AST: 43 U/L — ABNORMAL HIGH (ref 0–37)
Albumin: 3.4 g/dL — ABNORMAL LOW (ref 3.5–5.2)
Alkaline Phosphatase: 69 U/L (ref 39–117)
Calcium: 8.9 mg/dL (ref 8.4–10.5)
GFR calc non Af Amer: >90 mL/min (ref >90)
Potassium: 3.3 mEq/L — ABNORMAL LOW (ref 3.5–5.1)
Sodium: 138 mEq/L (ref 135–145)
Total Protein: 7.2 g/dL (ref 6.0–8.3)

## 2012-03-11 LAB — ANA: Anti Nuclear Antibody(ANA): NEGATIVE

## 2012-03-11 LAB — CBC
HCT: 21.9 % — ABNORMAL LOW (ref 36.0–46.0)
HCT: 21.7 % — ABNORMAL LOW (ref 36.0–46.0)
Hemoglobin: 7.1 g/dL — ABNORMAL LOW (ref 12.0–15.0)
MCH: 21.5 pg — ABNORMAL LOW (ref 26.0–34.0)
MCHC: 32.4 g/dL (ref 30.0–36.0)
MCHC: 32.7 g/dL (ref 30.0–36.0)
MCV: 65.8 fL — ABNORMAL LOW (ref 78.0–100.0)
MCV: 65.8 fL — ABNORMAL LOW (ref 78.0–100.0)
Platelets: 179 10*3/uL (ref 150–400)
RBC: 3.33 MIL/uL — ABNORMAL LOW (ref 3.87–5.11)
RDW: 18.5 % — ABNORMAL HIGH (ref 11.5–15.5)
RDW: 18.5 % — ABNORMAL HIGH (ref 11.5–15.5)
WBC: 4 10*3/uL (ref 4.0–10.5)

## 2012-03-11 LAB — TSH: TSH: 3.863 u[IU]/mL (ref 0.350–4.500)

## 2012-03-11 MED ORDER — BENZTROPINE MESYLATE 1 MG PO TABS
1.0000 mg | ORAL_TABLET | Freq: Two times a day (BID) | ORAL | Status: DC
  Administered 2012-03-12 – 2012-03-17 (×11): 1 mg via ORAL
  Filled 2012-03-11 (×14): qty 1

## 2012-03-11 MED ORDER — DARUNAVIR ETHANOLATE 800 MG PO TABS
800.0000 mg | ORAL_TABLET | Freq: Every day | ORAL | Status: DC
  Administered 2012-03-12 – 2012-03-17 (×6): 800 mg via ORAL
  Filled 2012-03-11 (×7): qty 1

## 2012-03-11 MED ORDER — HALOPERIDOL LACTATE 5 MG/ML IJ SOLN
INTRAMUSCULAR | Status: AC
  Filled 2012-03-11: qty 1

## 2012-03-11 MED ORDER — POTASSIUM CHLORIDE CRYS ER 20 MEQ PO TBCR
40.0000 meq | EXTENDED_RELEASE_TABLET | Freq: Once | ORAL | Status: AC
  Administered 2012-03-11: 40 meq via ORAL
  Filled 2012-03-11: qty 2

## 2012-03-11 MED ORDER — HALOPERIDOL 5 MG PO TABS
5.0000 mg | ORAL_TABLET | Freq: Once | ORAL | Status: AC
  Administered 2012-03-11: 5 mg via ORAL
  Filled 2012-03-11: qty 1

## 2012-03-11 MED ORDER — HALOPERIDOL 2 MG PO TABS
2.0000 mg | ORAL_TABLET | Freq: Four times a day (QID) | ORAL | Status: DC | PRN
  Administered 2012-03-12 – 2012-03-15 (×3): 2 mg via ORAL
  Filled 2012-03-11 (×4): qty 1

## 2012-03-11 MED ORDER — OLANZAPINE 10 MG PO TBDP
20.0000 mg | ORAL_TABLET | Freq: Every day | ORAL | Status: DC
  Administered 2012-03-12 – 2012-03-17 (×6): 20 mg via ORAL
  Filled 2012-03-11 (×8): qty 2

## 2012-03-11 MED ORDER — HALOPERIDOL LACTATE 5 MG/ML IJ SOLN
2.0000 mg | Freq: Once | INTRAMUSCULAR | Status: AC
  Administered 2012-03-11: 2 mg via INTRAVENOUS
  Filled 2012-03-11: qty 1

## 2012-03-11 MED ORDER — HALOPERIDOL LACTATE 5 MG/ML IJ SOLN
2.0000 mg | Freq: Four times a day (QID) | INTRAMUSCULAR | Status: DC | PRN
  Administered 2012-03-11: 2 mg via INTRAMUSCULAR

## 2012-03-11 MED ORDER — RITONAVIR 100 MG PO CAPS
100.0000 mg | ORAL_CAPSULE | Freq: Every day | ORAL | Status: DC
  Administered 2012-03-12 – 2012-03-17 (×5): 100 mg via ORAL
  Filled 2012-03-11 (×7): qty 1

## 2012-03-11 NOTE — Progress Notes (Addendum)
Subjective: Pt is same this morning and not distractable. Will answer questions in tangential and non-sensical way. Answers to her name. No complaints. Lots of religious thoughts and anointing in her speech. Objective: Vital signs in last 24 hours: Filed Vitals:   03/10/12 1851 03/10/12 2056 03/11/12 0332 03/11/12 0505  BP: 148/82 140/75  143/76  Pulse: 109 112  114  Temp: 98.9 F (37.2 C) 100.6 F (38.1 C)  97.5 F (36.4 C)  TempSrc: Oral Oral  Oral  Resp: 20 18  17   Height:   6' (1.829 m)   Weight:   145 lb (65.772 kg)   SpO2: 95% 94%  95%   Weight change:   Intake/Output Summary (Last 24 hours) at 03/11/12 1158 Last data filed at 03/11/12 0100  Gross per 24 hour  Intake    120 ml  Output      0 ml  Net    120 ml   Physical Exam: General:lying in the bed, soft restraints on, sitter in room, restless, animated,  HEENT: EOM intact  Cardiac: patient was not cooperative with exam  Pulm: patient was not cooperative with exam  Abd: she was pushing on it repeatedly during conversation and did not appear to be tender, otherwise would not cooperate with our exam  Ext: well perfused, no pedal edema noticed while she was dangling her feet over the edge of the bed  Neuro: would not answer questions regarding orientation, moving all four extremities, able to talk but prefers to communicate in a mix of sign language and made up sign language  Lab Results: Basic Metabolic Panel:  Lab 03/11/12 4540 03/11/12 0600  NA 136 138  K 3.4* 3.3*  CL 104 104  CO2 22 22  GLUCOSE 103* 90  BUN 7 7  CREATININE 0.72 0.69  CALCIUM 8.7 8.9  MG -- --  PHOS -- --   Liver Function Tests:  Lab 03/11/12 0600 03/10/12 0958  AST 43* 42*  ALT 18 18  ALKPHOS 69 76  BILITOT 0.8 0.7  PROT 7.2 8.2  ALBUMIN 3.4* 4.0   CBC:  Lab 03/11/12 0908 03/11/12 0600 03/10/12 0958  WBC 4.0 3.0* --  NEUTROABS -- -- 4.3  HGB 7.1* 7.1* --  HCT 21.7* 21.9* --  MCV 65.8* 65.8* --  PLT 179 181 --   Thyroid  Function Tests:  Lab 03/10/12 1938  TSH 3.863  T4TOTAL --  FREET4 --  T3FREE --  THYROIDAB --   Anemia Panel:  Lab 03/04/12 1547  VITAMINB12 865  FOLATE >20.0  FERRITIN 146  TIBC 350  IRON 20*  RETICCTPCT 0.6   Urine Drug Screen: Drugs of Abuse     Component Value Date/Time   LABOPIA NONE DETECTED 03/10/2012 1015   COCAINSCRNUR NONE DETECTED 03/10/2012 1015   LABBENZ NONE DETECTED 03/10/2012 1015   AMPHETMU NONE DETECTED 03/10/2012 1015   THCU NONE DETECTED 03/10/2012 1015   LABBARB NONE DETECTED 03/10/2012 1015    Alcohol Level:  Lab 03/10/12 0958  ETH <11   Urinalysis:  Lab 03/10/12 1015  COLORURINE YELLOW  LABSPEC 1.009  PHURINE 6.0  GLUCOSEU NEGATIVE  HGBUR NEGATIVE  BILIRUBINUR NEGATIVE  KETONESUR NEGATIVE  PROTEINUR NEGATIVE  UROBILINOGEN 0.2  NITRITE NEGATIVE  LEUKOCYTESUR NEGATIVE    Micro Results: Recent Results (from the past 240 hour(s))  URINE CULTURE     Status: Normal   Collection Time   03/04/12  3:08 AM      Component Value Range Status Comment  Specimen Description URINE, CLEAN CATCH   Final    Special Requests NONE   Final    Culture  Setup Time [id number]   Final    Colony Count 25,000 COLONIES/ML   Final    Culture     Final    Value: Multiple bacterial morphotypes present, none predominant. Suggest appropriate recollection if clinically indicated.   Report Status 03/06/2012 FINAL   Final    Studies/Results: Dg Chest 2 View  03/10/2012  *RADIOLOGY REPORT*  Clinical Data: Altered mental status  CHEST - 2 VIEW  Comparison: 06/05/2010  Findings: Lungs are clear. No pleural effusion or pneumothorax.  Cardiomediastinal silhouette is within normal limits.  Visualized osseous structures are within normal limits.  IMPRESSION: Normal chest radiographs.  Original Report Authenticated By: Charline Bills, M.D.   Ct Head Wo Contrast  03/10/2012  *RADIOLOGY REPORT*  Clinical Data: 36 year old female with psychosis.  HIV positive.  CT HEAD  WITHOUT CONTRAST  Technique:  Contiguous axial images were obtained from the base of the skull through the vertex without contrast.  Comparison: 03/04/2012.  Findings: Visualized orbits and scalp soft tissues are within normal limits.  Visualized paranasal sinuses and mastoids are clear.  No acute osseous abnormality identified.  Stable and normal cerebral volume.  No ventriculomegaly. Gray-white matter differentiation is within normal limits throughout the brain.  No midline shift, mass effect, or evidence of mass lesion. No acute intracranial hemorrhage identified.  No evidence of cortically based acute infarction identified.  No suspicious intracranial vascular hyperdensity.  IMPRESSION: Stable and normal noncontrast CT appearance of the brain.  Original Report Authenticated By: Harley Hallmark, M.D.   Medications: I have reviewed the patient's current medications. Scheduled Meds:   . azithromycin  1,200 mg Oral Weekly  . dapsone  100 mg Oral Daily  . darunavir  800 mg Oral Q breakfast  . emtricitabine-tenofovir  1 tablet Oral Daily  . enoxaparin  40 mg Subcutaneous Q24H  . fluconazole  100 mg Oral Weekly  . haloperidol  5 mg Oral Once  . haloperidol lactate  2 mg Intravenous Once  . pantoprazole  40 mg Oral Q1200  . potassium chloride  40 mEq Oral Once  . ritonavir  100 mg Oral Q breakfast  . valACYclovir  500 mg Oral BID  . white petrolatum      . DISCONTD: darunavir  800 mg Oral Daily  . DISCONTD: ritonavir  100 mg Oral Daily   Continuous Infusions:   . sodium chloride 150 mL/hr at 03/11/12 0112   PRN Meds:.acetaminophen, acetaminophen, ondansetron (ZOFRAN) IV, ondansetron Assessment/Plan: Acute psychosis - Per psych. They will take to behavioral health but are not stating that they would take her today. They left several options for treatment however did not comment on which might be best to try first.   Hyponatremia - resolved, due to volume contraction  Drop in Hg - Hg levels  today are 7.1 and no signs, symptoms of active bleeding. Will continue to follow with CBC q 12 hours. Would not do EGD in this acute setting if blood counts are stable. Baseline is around 8.5 from anemia of chronic disease. Work up was done about 1 week ago. Likely drop from 7.7 to 7.1 was due to hemodilution from IVF overnight.   Hypokalemia - K is 3.4 which is only slightly below the range of normal and then potassium was given afterwards. Will recheck only if neccesary for behavioral health otherwise no medical indication to do so.  Disposition - To behavioral health once they decide she is stable. Will start olanzepine 20 mg daily sublingual as this does not interfere with her medications.    LOS: 1 day   Genella Mech 03/11/2012, 11:58 AM

## 2012-03-11 NOTE — Progress Notes (Signed)
Patient still agitated,rambling and gesturing incoherently.MD Eben Burow made aware,MD to put order for IV haldol. 07:05 RN attempted to give IV haldol,IV leaking.Called back Dr Eben Burow and got an order to give haldol IM. Madison Ripoll Joselita,RN

## 2012-03-11 NOTE — Progress Notes (Addendum)
Clinical Social Work:  With Psychiatry  Have called and submitted referral for patient to be transferred to [facility] once medically stable.  At this time Psych MD feels patient not medically stable and possibly ready tomorrow.  Referral sent for possible admission if [facility] thinks they can handle patient and if not will follow up on 3/28 for medical clearance as well as plan to dc.   Patient remains actively psychotic and is appropriate for admission once stable.  Will continue to follow.  Ashley Jacobs, MSW LCSW [phone]  Have also faxed to [facility] for review and treatment facility option.  Will follow up.  HN 1400pm  At 1430pm:  Patient was declined by [facility] due to medicaid and acuity  1436pm Spoke with Alice with Assessment at [facility] and she reports medical team has clinicals and referral and it is in review.  No word as of if patient is accepted and bed status.  Will have evening CSW to follow up with patient in hopeful transfer to [facility] if not this evening then tomorrow. Will follow up. HN

## 2012-03-11 NOTE — Progress Notes (Signed)
Pt has been refusing to take her medications all day.  I have attempted several times and she spit the meds back out. MD was made aware. Pt continues to holler and yell out sentences and words that do not make any sense. She has a Comptroller at the bedside along with bil wrist restraints. Will cont to monitor.

## 2012-03-11 NOTE — Progress Notes (Signed)
Patient tearful,gesturing and speaking incoherent words.Repeats words that you ask her.Patient also repeatedly keeps messing with her IV, site wrapped with kerlix.Sitter at bedside. Tamalyn Wadsworth Joselita,RN

## 2012-03-11 NOTE — Progress Notes (Signed)
06:15 Charge Nurse spoke with patient's sister Sunny Schlein regarding need to place patient on restraint for her safety,sister agreeable. Alif Petrak Joselita,RN

## 2012-03-11 NOTE — H&P (Signed)
Please see Dr Lavonna Monarch H&P for full details.   [name] is a woman with PMHx sig for HIV, now AIDS and just restarted HARRT in med Feb along with meds for OI. Pt was seen and kept in ER one week ago for acute psychosis which apparently resolved. Dr Dorise Hiss was able to speak to sister and pts behavior was not back to baseline at ER D/C and pt cont to act erratically. She was brought back to ER. Pysch has asked that we medically clear pt prior to transfer to [facility]. Pt has no prior pysch illness.  PMHX. 1. HIV per chart dx 13 yrs ago. Larey Seat out of care and resumed meds Feb 2013 2. H/O substance use  Pt cannot answer questions but per original pysch eval in ER, no ETOH and quit all drugs 4 yrs ago  Exam : pt in bed with 4 nurses / techs. She has her hands restrained. She speaks but is nonsensical and will not appropriately answer questions. She moves all 4.  A/P 1. Pyschosis - etiology unclear but does not seem to be 2/2 ingestion. IRIS is a possibility. Encephalitis / meningitis unlikely as no temp, no increased WBC, no nuchal rigidity. Risk of LP in uncooperative pt greater than benefits in this low pretest prob pt. Wilson's disease possible in young pt with neuro changes but no liver failure, no hemolysis so very low prob. Pt needs transferred to [facility].  2. Hyponatremia - mild. Urine seems to be appropriately dilute but Na low which is more c/w volume contraction. IVF until able to take PO.   3. Anemia - 1.5 gram drop since Feb. 1 gram drop since ER visit. Iron studies c/w chronic diease. Hemo stable. No acute blood loss. No acute GI intervention required. Can be safely followed at Mental Health.  Pt medically stable for transfer. Follow periodic HgB levels. Cont IVF until taking PO reliably. Follow Na QOD until stable.

## 2012-03-11 NOTE — Consult Note (Signed)
Reason for Consult: Acute Psychosis Referring Physician:Dr. Lorine Bears [name] is an 36 y.o. female.  HPI: The patient is a 36 YO female who originally presented to the ED approximately one week ago with her sister for acute psychosis. She was diagnosed with acute psychosis via telepsych. She was observed in the ED for several days and supposedly the psychosis resolved although note from her sister states that she never witnessed the change. She was brought back to the ED today by her sister for the same problem of acute psychosis. She is not able to provide history and this was gleamed from chart review of ED notes that she had been gesturing and speaking incoherently, saying words clearly but none that make sense or connect to one another. Per recent notes, patient with increased emotional stress recently. No hx of psychiatric disorders prior to last week. She does have past medical history significant for HIV that was untreated from 2006 to recently. She was restarted on HAART mid February and dapsone, valtrex, diflucan, and azithromycin for prophylaxis. She lost insurance and was lost to follow up until then. Last CD4 at that time was < 10 with viral load of 700K. During medical evaluation for transfer to behavioral health it was noted that her sodium was 130 and she was admitted for medical clearance. She is unable to provide any history at this time as she goes on about random things and says a lot of things like "saturday has anointed me because it is in the future and has not happened yet and..." . It is reported by sister that she went to pt's home yesterday am to get children [2] off to school.  She reported to RN that there was writing all over the walls and all clocks were unplugged.  She [pt] was still rambling words, incomplete sentences. Her sister had her brought to [facility] ED  AXIS Madison  Acute Psychosis, r/o related to elevated HIV viral load, r/o toxic substance[s]  AXIS II  Deferred AXIS III Past Medical History  Diagnosis Date  . HIV positive   . Hepatitis B     /E-chart  . Microcytic anemia     h/o per E-chart  . Pyelonephritis     h/o per E-chart  . 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 Noncompliance with medication regimen     /e-chart  . Acute psychosis 03/10/12    2nd admission in last wk for this    Past Surgical History  Procedure Date  . Thyroid surgery   . Thyroid lobectomy 08/2002    left & isthmectomy; for benign thyroid adenoma/E-chart  . Tubal ligation 07/2002    /E-chart  AXIS IV Family support, non-adherence to medication/medical regimen, mental illness; HIV, finances, access to health care AXIS V  GAF  40  No family history on file.  Social History:  reports that she has never smoked. She has never used smokeless tobacco. She reports that she drinks alcohol. She reports that she does not use illicit drugs.  Allergies:  Allergies  Allergen Reactions  . Bactrim Itching  . Orange Fruit Itching  . Peanut-Containing Drug Products Hives  . Shellfish Allergy Hives    Medications: Madison have reviewed the patient's current medications.  Results for orders placed during the hospital encounter of 03/10/12 (from the past 48 hour(s))  CBC     Status: Abnormal   Collection Time   03/10/12  9:58 AM  Component Value Range Comment   WBC 6.0  4.0 - 10.5 (K/uL)    RBC 3.62 (*) 3.87 - 5.11 (MIL/uL)    Hemoglobin 7.7 (*) 12.0 - 15.0 (g/dL)    HCT 45.4 (*) 09.8 - 46.0 (%)    MCV 64.9 (*) 78.0 - 100.0 (fL)    MCH 21.3 (*) 26.0 - 34.0 (pg)    MCHC 32.8  30.0 - 36.0 (g/dL)    RDW 11.9 (*) 14.7 - 15.5 (%)    Platelets 215  150 - 400 (K/uL)   DIFFERENTIAL     Status: Normal   Collection Time   03/10/12  9:58 AM      Component Value Range Comment   Neutrophils Relative 71  43 - 77 (%)    Lymphocytes Relative 19  12 - 46 (%)    Monocytes Relative 10  3 - 12 (%)    Eosinophils Relative 0   0 - 5 (%)    Basophils Relative 0  0 - 1 (%)    Neutro Abs 4.3  1.7 - 7.7 (K/uL)    Lymphs Abs 1.1  0.7 - 4.0 (K/uL)    Monocytes Absolute 0.6  0.1 - 1.0 (K/uL)    Eosinophils Absolute 0.0  0.0 - 0.7 (K/uL)    Basophils Absolute 0.0  0.0 - 0.1 (K/uL)    RBC Morphology TARGET CELLS   MICROCYTES   WBC Morphology MILD LEFT SHIFT (1-5% METAS, OCC MYELO, OCC BANDS)   ATYPICAL LYMPHOCYTES  COMPREHENSIVE METABOLIC PANEL     Status: Abnormal   Collection Time   03/10/12  9:58 AM      Component Value Range Comment   Sodium 129 (*) 135 - 145 (mEq/L)    Potassium 3.8  3.5 - 5.1 (mEq/L)    Chloride 92 (*) 96 - 112 (mEq/L)    CO2 22  19 - 32 (mEq/L)    Glucose, Bld 98  70 - 99 (mg/dL)    BUN 10  6 - 23 (mg/dL)    Creatinine, Ser 8.29  0.50 - 1.10 (mg/dL)    Calcium 9.1  8.4 - 10.5 (mg/dL)    Total Protein 8.2  6.0 - 8.3 (g/dL)    Albumin 4.0  3.5 - 5.2 (g/dL)    AST 42 (*) 0 - 37 (U/L)    ALT 18  0 - 35 (U/L)    Alkaline Phosphatase 76  39 - 117 (U/L)    Total Bilirubin 0.7  0.3 - 1.2 (mg/dL)    GFR calc non Af Amer 88 (*) >90 (mL/min)    GFR calc Af Amer >90  >90 (mL/min)   ACETAMINOPHEN LEVEL     Status: Normal   Collection Time   03/10/12  9:58 AM      Component Value Range Comment   Acetaminophen (Tylenol), Serum <15.0  10 - 30 (ug/mL)   SALICYLATE LEVEL     Status: Abnormal   Collection Time   03/10/12  9:58 AM      Component Value Range Comment   Salicylate Lvl <2.0 (*) 2.8 - 20.0 (mg/dL)   ETHANOL     Status: Normal   Collection Time   03/10/12  9:58 AM      Component Value Range Comment   Alcohol, Ethyl (B) <11  0 - 11 (mg/dL)   URINE RAPID DRUG SCREEN (HOSP PERFORMED)     Status: Normal   Collection Time   03/10/12 10:15 AM  Component Value Range Comment   Opiates NONE DETECTED  NONE DETECTED     Cocaine NONE DETECTED  NONE DETECTED     Benzodiazepines NONE DETECTED  NONE DETECTED     Amphetamines NONE DETECTED  NONE DETECTED     Tetrahydrocannabinol NONE DETECTED  NONE  DETECTED     Barbiturates NONE DETECTED  NONE DETECTED    URINALYSIS, ROUTINE W REFLEX MICROSCOPIC     Status: Normal   Collection Time   03/10/12 10:15 AM      Component Value Range Comment   Color, Urine YELLOW  YELLOW     APPearance CLEAR  CLEAR     Specific Gravity, Urine 1.009  1.005 - 1.030     pH 6.0  5.0 - 8.0     Glucose, UA NEGATIVE  NEGATIVE (mg/dL)    Hgb urine dipstick NEGATIVE  NEGATIVE     Bilirubin Urine NEGATIVE  NEGATIVE     Ketones, ur NEGATIVE  NEGATIVE (mg/dL)    Protein, ur NEGATIVE  NEGATIVE (mg/dL)    Urobilinogen, UA 0.2  0.0 - 1.0 (mg/dL)    Nitrite NEGATIVE  NEGATIVE     Leukocytes, UA NEGATIVE  NEGATIVE  MICROSCOPIC NOT DONE ON URINES WITH NEGATIVE PROTEIN, BLOOD, LEUKOCYTES, NITRITE, OR GLUCOSE <1000 mg/dL.  PREGNANCY, URINE     Status: Normal   Collection Time   03/10/12 10:15 AM      Component Value Range Comment   Preg Test, Ur NEGATIVE  NEGATIVE    OSMOLALITY     Status: Abnormal   Collection Time   03/10/12  5:32 PM      Component Value Range Comment   Osmolality 273 (*) 275 - 300 (mOsm/kg)   TSH     Status: Normal   Collection Time   03/10/12  7:38 PM      Component Value Range Comment   TSH 3.863  0.350 - 4.500 (uIU/mL)   ANA     Status: Normal   Collection Time   03/10/12  7:38 PM      Component Value Range Comment   ANA NEGATIVE  NEGATIVE    OSMOLALITY, URINE     Status: Abnormal   Collection Time   03/10/12  7:52 PM      Component Value Range Comment   Osmolality, Ur 109 (*) 390 - 1090 (mOsm/kg)   SODIUM, URINE, RANDOM     Status: Normal   Collection Time   03/10/12  7:52 PM      Component Value Range Comment   Sodium, Ur 13     COMPREHENSIVE METABOLIC PANEL     Status: Abnormal   Collection Time   03/11/12  6:00 AM      Component Value Range Comment   Sodium 138  135 - 145 (mEq/L)    Potassium 3.3 (*) 3.5 - 5.1 (mEq/L)    Chloride 104  96 - 112 (mEq/L)    CO2 22  19 - 32 (mEq/L)    Glucose, Bld 90  70 - 99 (mg/dL)    BUN 7  6 -  23 (mg/dL)    Creatinine, Ser 1.61  0.50 - 1.10 (mg/dL)    Calcium 8.9  8.4 - 10.5 (mg/dL)    Total Protein 7.2  6.0 - 8.3 (g/dL)    Albumin 3.4 (*) 3.5 - 5.2 (g/dL)    AST 43 (*) 0 - 37 (U/L)    ALT 18  0 - 35 (U/L)    Alkaline  Phosphatase 69  39 - 117 (U/L)    Total Bilirubin 0.8  0.3 - 1.2 (mg/dL)    GFR calc non Af Amer >90  >90 (mL/min)    GFR calc Af Amer >90  >90 (mL/min)   CBC     Status: Abnormal   Collection Time   03/11/12  6:00 AM      Component Value Range Comment   WBC 3.0 (*) 4.0 - 10.5 (K/uL)    RBC 3.33 (*) 3.87 - 5.11 (MIL/uL)    Hemoglobin 7.1 (*) 12.0 - 15.0 (g/dL)    HCT 16.1 (*) 09.6 - 46.0 (%)    MCV 65.8 (*) 78.0 - 100.0 (fL)    MCH 21.3 (*) 26.0 - 34.0 (pg)    MCHC 32.4  30.0 - 36.0 (g/dL)    RDW 04.5 (*) 40.9 - 15.5 (%)    Platelets 181  150 - 400 (K/uL)   BASIC METABOLIC PANEL     Status: Abnormal   Collection Time   03/11/12  9:08 AM      Component Value Range Comment   Sodium 136  135 - 145 (mEq/L)    Potassium 3.4 (*) 3.5 - 5.1 (mEq/L)    Chloride 104  96 - 112 (mEq/L)    CO2 22  19 - 32 (mEq/L)    Glucose, Bld 103 (*) 70 - 99 (mg/dL)    BUN 7  6 - 23 (mg/dL)    Creatinine, Ser 8.11  0.50 - 1.10 (mg/dL)    Calcium 8.7  8.4 - 10.5 (mg/dL)    GFR calc non Af Amer >90  >90 (mL/min)    GFR calc Af Amer >90  >90 (mL/min)   CBC     Status: Abnormal   Collection Time   03/11/12  9:08 AM      Component Value Range Comment   WBC 4.0  4.0 - 10.5 (K/uL)    RBC 3.30 (*) 3.87 - 5.11 (MIL/uL)    Hemoglobin 7.1 (*) 12.0 - 15.0 (g/dL)    HCT 91.4 (*) 78.2 - 46.0 (%)    MCV 65.8 (*) 78.0 - 100.0 (fL)    MCH 21.5 (*) 26.0 - 34.0 (pg)    MCHC 32.7  30.0 - 36.0 (g/dL)    RDW 95.6 (*) 21.3 - 15.5 (%)    Platelets 179  150 - 400 (K/uL)   SEDIMENTATION RATE     Status: Abnormal   Collection Time   03/11/12  9:08 AM      Component Value Range Comment   Sed Rate 51 (*) 0 - 22 (mm/hr)     Dg Chest 2 View  03/10/2012  *RADIOLOGY REPORT*  Clinical Data:  Altered mental status  CHEST - 2 VIEW  Comparison: 06/05/2010  Findings: Lungs are clear. No pleural effusion or pneumothorax.  Cardiomediastinal silhouette is within normal limits.  Visualized osseous structures are within normal limits.  IMPRESSION: Normal chest radiographs.  Original Report Authenticated By: Charline Bills, M.D.   Ct Head Wo Contrast  03/10/2012  *RADIOLOGY REPORT*  Clinical Data: 36 year old female with psychosis.  HIV positive.  CT HEAD WITHOUT CONTRAST  Technique:  Contiguous axial images were obtained from the base of the skull through the vertex without contrast.  Comparison: 03/04/2012.  Findings: Visualized orbits and scalp soft tissues are within normal limits.  Visualized paranasal sinuses and mastoids are clear.  No acute osseous abnormality identified.  Stable and normal cerebral volume.  No ventriculomegaly. Gray-white matter differentiation  is within normal limits throughout the brain.  No midline shift, mass effect, or evidence of mass lesion. No acute intracranial hemorrhage identified.  No evidence of cortically based acute infarction identified.  No suspicious intracranial vascular hyperdensity.  IMPRESSION: Stable and normal noncontrast CT appearance of the brain.  Original Report Authenticated By: Harley Hallmark, M.D.    Review of Systems  Unable to perform ROS: other   Blood pressure 143/76, pulse 114, temperature 97.5 F (36.4 C), temperature source Oral, resp. rate 17, height 6' (1.829 m), weight 65.772 kg (145 lb), last menstrual period 02/10/2012, SpO2 95.00%. Physical Exam  Assessment/Plan  Chart reviewed, Discussed with Psych CSW, RN; paged Dr. Dorthula Rue [phone]. This pt. Has been in ED and home.  Now returns with same symptoms of acute psychosis. According to sister had no prior hx of mental illness.  She has HIV with VL 727K. Anemia, and low potassium.  She has received Haldol 5mg  IM prior to interview.  She is quiet in bed.  When addressed as 'Ti  Ti' she begins rambling with 'word salad'  Speech is clear, words are understood but totally nonsensical, out of context, with hyper-religious references. She speaks with eyes closed and repeated prompting finally opens her eyes, givse greeting to MD and lapses into rambling speech.  Apparently haloperidol IM has made no change in prior reported psychotic behavior. It is known that HIV has a CNS neurological effect but more demential-like symptoms have been recorded.Marland Kitchen MRI of head wnl RECOMMENDATION 1. Pt is a candidate for an appropriate inpatient psychiatric unit when medically stable. 2. Consider whether pt has consumed any drugs or toxic substances to induce psychosis  3. Consider Zyprexa Zydis, olanzepine,  [a disc melt] 20 mg daily OR Geodon 60-80 mg BID [both depending on EKG wnl - no QTc prolongation] to calm psychotic symptoms ALSO: if compatible with present antifungal/antiviral meds 4. Consider Cogentin, benztropine, 1 mg BID as needed if pt develops EPS [dystonia or worse akathisia] 5.Will follow pt.     Sparkle Aube 03/11/2012, 10:59 AM

## 2012-03-11 NOTE — Progress Notes (Signed)
05:40 Patient got OOB to use bathroom,after doing so,patient sat on the floor and laid down,covered herself with blanket and refused to get up and back to bed.Patient rambling incoherent words,and making hand gestures and hand signs.06:10 Security was called to help get pt back in bed. 06:30 Called MD Tobriner and made aware of pt's behavior,MD to put order in for haldol p.o. [name] Joselita,RN

## 2012-03-12 ENCOUNTER — Telehealth: Payer: Self-pay | Admitting: *Deleted

## 2012-03-12 LAB — COMPREHENSIVE METABOLIC PANEL
ALT: 18 U/L (ref 0–35)
AST: 40 U/L — ABNORMAL HIGH (ref 0–37)
Alkaline Phosphatase: 65 U/L (ref 39–117)
Calcium: 9 mg/dL (ref 8.4–10.5)
GFR calc Af Amer: >90 mL/min (ref >90)
Sodium: 139 mEq/L (ref 135–145)
Total Protein: 7 g/dL (ref 6.0–8.3)

## 2012-03-12 LAB — CBC
MCH: 20.6 pg — ABNORMAL LOW (ref 26.0–34.0)
MCHC: 31.4 g/dL (ref 30.0–36.0)
Platelets: 190 10*3/uL (ref 150–400)
RDW: 18.6 % — ABNORMAL HIGH (ref 11.5–15.5)

## 2012-03-12 LAB — ABO/RH: ABO/RH(D): A POS

## 2012-03-12 NOTE — Telephone Encounter (Signed)
RN entered pt chart by "breaking glass."  Found that pt had been transferred to [facility] from [place].  RN acknowledged that calling the pt back while at [facility] if there was not an Infectious Disease issue would interfere with the pt's therapy.  Will route message to Dr. Orvan Falconer.

## 2012-03-12 NOTE — Progress Notes (Addendum)
Clinical Social Work with Psychiatry:  Patient has been re-faxed to assessment for review of medical chart , labs, and clinicals for hopeful acceptance to facility if medically cleared.  Will await to hear from [facility] regarding admission status.  No bed and no acceptance at this time.  Ashley Jacobs, MSW LCSW [phone]  1142am spoke with assessment team.  No bed available for patient today. Will review tomorrow.  Ashley Jacobs, MSW LCSW [phone]

## 2012-03-12 NOTE — Progress Notes (Signed)
Pt is extremely agitated with sitter at the bedside. Refuses to take any scheduled medications, and has spit out several times. Called Dr Berlinda Last order given for PRN medication for anxiety and agitation. Will continue to monitor.

## 2012-03-12 NOTE — Progress Notes (Signed)
Subjective: Pt is actually able to talk to me this morning and states that she has not moved her bowels and is hungry because she has not eaten. She is having a slight headache. Otherwise is denying pain. Talked to her about her blood counts and she seemed receptive to the idea of getting blood. States she is feeling tired.   Objective: Vital signs in last 24 hours: Filed Vitals:   03/11/12 1500 03/11/12 1755 03/11/12 2137 03/12/12 0559  BP: 140/86 133/83 148/85 112/73  Pulse: 116 109 101 92  Temp:  98.6 F (37 C) 97.4 F (36.3 C) 97.7 F (36.5 C)  TempSrc:  Oral Axillary Oral  Resp: 22 20 18 16   Height:   6' (1.829 m)   Weight:   145 lb 1.6 oz (65.817 kg)   SpO2: 96% 96% 96% 100%   Weight change: 1 lb 9.6 oz (0.726 kg)  Intake/Output Summary (Last 24 hours) at 03/12/12 1191 Last data filed at 03/12/12 4782  Gross per 24 hour  Intake    720 ml  Output      2 ml  Net    718 ml   Physical Exam: General:lying in the bed, no soft restraints, sitter in room, docile HEENT: EOM intact  Cardiac: patient was not cooperative with exam  Pulm: patient was not cooperative with exam  Abd: patient was not cooperative with exam Ext: no edema noted Neuro: was able to answer questions, did not know where she was but knew her name, moving all four extremities,  Lab Results: Basic Metabolic Panel:  Lab 03/12/12 9562 03/11/12 0908  NA 139 136  K 3.9 3.4*  CL 106 104  CO2 23 22  GLUCOSE 99 103*  BUN 4* 7  CREATININE 0.56 0.72  CALCIUM 9.0 8.7  MG -- --  PHOS -- --   Liver Function Tests:  Lab 03/12/12 0545 03/11/12 0600  AST 40* 43*  ALT 18 18  ALKPHOS 65 69  BILITOT 0.6 0.8  PROT 7.0 7.2  ALBUMIN 3.2* 3.4*   CBC:  Lab 03/12/12 0545 03/11/12 0908 03/10/12 0958  WBC 1.9* 4.0 --  NEUTROABS -- -- 4.3  HGB 7.0* 7.1* --  HCT 22.3* 21.7* --  MCV 65.8* 65.8* --  PLT 190 179 --   Thyroid Function Tests:  Lab 03/10/12 1938  TSH 3.863  T4TOTAL --  FREET4 --  T3FREE --    THYROIDAB --   Urine Drug Screen: Drugs of Abuse     Component Value Date/Time   LABOPIA NONE DETECTED 03/10/2012 1015   COCAINSCRNUR NONE DETECTED 03/10/2012 1015   LABBENZ NONE DETECTED 03/10/2012 1015   AMPHETMU NONE DETECTED 03/10/2012 1015   THCU NONE DETECTED 03/10/2012 1015   LABBARB NONE DETECTED 03/10/2012 1015    Alcohol Level:  Lab 03/10/12 0958  ETH <11   Urinalysis:  Lab 03/10/12 1015  COLORURINE YELLOW  LABSPEC 1.009  PHURINE 6.0  GLUCOSEU NEGATIVE  HGBUR NEGATIVE  BILIRUBINUR NEGATIVE  KETONESUR NEGATIVE  PROTEINUR NEGATIVE  UROBILINOGEN 0.2  NITRITE NEGATIVE  LEUKOCYTESUR NEGATIVE   Micro Results: Recent Results (from the past 240 hour(s))  URINE CULTURE     Status: Normal   Collection Time   03/04/12  3:08 AM      Component Value Range Status Comment   Specimen Description URINE, CLEAN CATCH   Final    Special Requests NONE   Final    Culture  Setup Time [id number]   Final  Colony Count 25,000 COLONIES/ML   Final    Culture     Final    Value: Multiple bacterial morphotypes present, none predominant. Suggest appropriate recollection if clinically indicated.   Report Status 03/06/2012 FINAL   Final    Studies/Results: Dg Chest 2 View  03/10/2012  *RADIOLOGY REPORT*  Clinical Data: Altered mental status  CHEST - 2 VIEW  Comparison: 06/05/2010  Findings: Lungs are clear. No pleural effusion or pneumothorax.  Cardiomediastinal silhouette is within normal limits.  Visualized osseous structures are within normal limits.  IMPRESSION: Normal chest radiographs.  Original Report Authenticated By: Charline Bills, M.D.   Ct Head Wo Contrast  03/10/2012  *RADIOLOGY REPORT*  Clinical Data: 36 year old female with psychosis.  HIV positive.  CT HEAD WITHOUT CONTRAST  Technique:  Contiguous axial images were obtained from the base of the skull through the vertex without contrast.  Comparison: 03/04/2012.  Findings: Visualized orbits and scalp soft tissues are  within normal limits.  Visualized paranasal sinuses and mastoids are clear.  No acute osseous abnormality identified.  Stable and normal cerebral volume.  No ventriculomegaly. Gray-white matter differentiation is within normal limits throughout the brain.  No midline shift, mass effect, or evidence of mass lesion. No acute intracranial hemorrhage identified.  No evidence of cortically based acute infarction identified.  No suspicious intracranial vascular hyperdensity.  IMPRESSION: Stable and normal noncontrast CT appearance of the brain.  Original Report Authenticated By: Harley Hallmark, M.D.   Medications: I have reviewed the patient's current medications. Scheduled Meds:   . azithromycin  1,200 mg Oral Weekly  . benztropine  1 mg Oral BID  . dapsone  100 mg Oral Daily  . darunavir  800 mg Oral Q breakfast  . emtricitabine-tenofovir  1 tablet Oral Daily  . enoxaparin  40 mg Subcutaneous Q24H  . fluconazole  100 mg Oral Weekly  . OLANZapine zydis  20 mg Oral Daily  . pantoprazole  40 mg Oral Q1200  . potassium chloride  40 mEq Oral Once  . ritonavir  100 mg Oral Q breakfast  . valACYclovir  500 mg Oral BID  . white petrolatum      . DISCONTD: darunavir  800 mg Oral Daily  . DISCONTD: ritonavir  100 mg Oral Daily   Continuous Infusions:  PRN Meds:.acetaminophen, acetaminophen, haloperidol, haloperidol lactate, ondansetron (ZOFRAN) IV, ondansetron Assessment/Plan:  Acute psychosis - Per psych. They will take to behavioral health and will coordinate to see if there is a bed today.  Did start one of their treatment options. Will follow.   Hyponatremia - resolved, due to volume contraction   Drop in Hg - Hg levels today are 7.0 and no signs, symptoms of active bleeding. Would not do EGD in this acute setting if blood counts are stable. Baseline is around 8.5 from anemia of chronic disease. Work up was done about 1 week ago. Will give 1 unit of PRBC if she allows this today.   Hypokalemia -  K is 3.9, resolved. Likely secondary to poor PO intake.   Disposition - To behavioral health once they decide she is stable. Did start olanzepine 20 mg daily sublingual as this does not interfere with her medications. EKG done with QTC of 465 so will follow. Will give 1 unit blood today and follow CBC tomorrow if she is still on our service.    LOS: 2 days   Genella Mech 03/12/2012, 8:17 AM

## 2012-03-12 NOTE — ED Provider Notes (Signed)
Medical screening examination/treatment/procedure(s) were performed by non-physician practitioner and as supervising physician I was immediately available for consultation/collaboration.   Laray Anger, DO 03/12/12 1757

## 2012-03-13 LAB — TYPE AND SCREEN
Antibody Screen: NEGATIVE
Unit division: 0

## 2012-03-13 LAB — CBC
HCT: 25.7 % — ABNORMAL LOW (ref 36.0–46.0)
Hemoglobin: 8.3 g/dL — ABNORMAL LOW (ref 12.0–15.0)
MCHC: 32.3 g/dL (ref 30.0–36.0)
MCV: 68.2 fL — ABNORMAL LOW (ref 78.0–100.0)
RDW: 19.6 % — ABNORMAL HIGH (ref 11.5–15.5)

## 2012-03-13 MED ORDER — BENZTROPINE MESYLATE 1 MG PO TABS: 1.0000 mg | ORAL_TABLET | Freq: Two times a day (BID) | ORAL | Status: DC

## 2012-03-13 MED ORDER — OLANZAPINE 20 MG PO TBDP: 20.0000 mg | ORAL_TABLET | Freq: Every day | ORAL | Status: DC

## 2012-03-13 NOTE — Progress Notes (Signed)
CSW following pt for referral to inpt behavioral health hospital. No appropriate beds for Pt at [facility] (d/t insurance), [facility] (no female beds), or [facility] (no beds). Nurse noted that pt is showing some signs of improvement. Psych continues to f/u while waiting for bed availability.  Frederico Hamman, LCSW [phone] covering for psych LCSW.

## 2012-03-13 NOTE — Progress Notes (Signed)
03/13/12 patient noted to be compliant with cares and appropriate with place and situation.  Patient has also been stable on her feet and able to mobilize safely, therefore I allowed the safety sitter be discontinued, md aware.  Patient is in a camera room in case there is any immediate change.  Continue to monitor  [employer] rn

## 2012-03-13 NOTE — Discharge Summary (Signed)
Internal Medicine Teaching [facility] Discharge Note  Name: [name] MRN: [id number] DOB: 05-Dec-1976 36 y.o.  Date of Admission: 03/10/2012  9:24 AM Date of Discharge: 03/17/2012 Attending Physician: Burns Spain, MD  Discharge Diagnosis: Acute Psychosis HIV Anemia of Chronic Disease  Discharge Medications: Medication List  As of 03/17/2012 11:21 AM   STOP taking these medications         omeprazole 20 MG capsule         TAKE these medications         azithromycin 600 MG tablet   Commonly known as: ZITHROMAX   Take 2 tablets (1,200 mg total) by mouth once a week.      benztropine 1 MG tablet   Commonly known as: COGENTIN   Take 1 tablet (1 mg total) by mouth 2 (two) times daily.      dapsone 100 MG tablet   Take 100 mg by mouth daily.      darunavir 400 MG tablet   Commonly known as: PREZISTA   Take 800 mg by mouth daily.      emtricitabine-tenofovir 200-300 MG per tablet   Commonly known as: TRUVADA   Take 1 tablet by mouth daily.      fluconazole 100 MG tablet   Commonly known as: DIFLUCAN   Take 1 tablet (100 mg total) by mouth once a week.      OLANZapine zydis 20 MG disintegrating tablet   Commonly known as: ZYPREXA   Take 1 tablet (20 mg total) by mouth daily.      ritonavir 100 MG capsule   Commonly known as: NORVIR   Take 100 mg by mouth daily.      valACYclovir 500 MG tablet   Commonly known as: VALTREX   Take 500 mg by mouth 2 (two) times daily.            Disposition and follow-up:   [name] was discharged from [facility] in Stable condition.    Follow-up Appointments: Follow-up Information    Follow up with Cliffton Asters, MD on 04/06/2012.   Contact information:   [address] [place] [address] [phone]       Follow up with [facility] on 03/18/2012.        Discharge Orders    Future Appointments: Provider: Department: Dept Phone: Center:     03/23/2012 9:30 AM [facility] [phone] [facility]   04/06/2012 2:45 PM Cliffton Asters, MD [facility] [phone] [facility]     Future Orders Please Complete By Expires   Diet general         Consultations:  psychiatry  Procedures Performed:  Dg Chest 2 View  03/10/2012  *RADIOLOGY REPORT*  Clinical Data: Altered mental status  CHEST - 2 VIEW  Comparison: 06/05/2010  Findings: Lungs are clear. No pleural effusion or pneumothorax.  Cardiomediastinal silhouette is within normal limits.  Visualized osseous structures are within normal limits.  IMPRESSION: Normal chest radiographs.  Original Report Authenticated By: Charline Bills, M.D.   Ct Head Wo Contrast  03/10/2012  *RADIOLOGY REPORT*  Clinical Data: 36 year old female with psychosis.  HIV positive.  CT HEAD WITHOUT CONTRAST  Technique:  Contiguous axial images were obtained from the base of the skull through the vertex without contrast.  Comparison: 03/04/2012.  Findings: Visualized orbits and scalp soft tissues are within normal limits.  Visualized paranasal sinuses and mastoids are clear.  No acute osseous abnormality identified.  Stable and normal cerebral volume.  No ventriculomegaly. Gray-white matter differentiation is within normal limits throughout the brain.  No midline shift, mass effect, or evidence of mass lesion. No acute intracranial hemorrhage identified.  No evidence of cortically based acute infarction identified.  No suspicious intracranial vascular hyperdensity.  IMPRESSION: Stable and normal noncontrast CT appearance of the brain.  Original Report Authenticated By: Harley Hallmark, M.D.   Ct Head Wo Contrast  03/04/2012  *RADIOLOGY REPORT*  Clinical Data: Altered mental status.  CT HEAD WITHOUT CONTRAST  Technique:  Contiguous axial images were obtained from the base of the skull through the vertex without contrast.  Comparison: None.  Findings: No acute intracranial hemorrhage.  No focal mass  lesion. No CT evidence of acute infarction.   No midline shift or mass effect.  No hydrocephalus.  Basilar cisterns are patent.  IMPRESSION: Normal head CT.  Original Report Authenticated By: Genevive Bi, M.D.   Admission HPI:  The patient is a 36 YO female who originally presented to the ED approximately one week ago with her sister for acute psychosis. She was diagnosed with acute psychosis via telepsych. She was observed in the ED for several days and supposedly the psychosis resolved although note from her sister states that she never witnessed the change. She was brought back to the ED today by her sister for the same problem of acute psychosis. She is not able to provide history and this was gleamed from chart review of ED notes that she had been gesturing and speaking incoherently, saying words clearly but none that make sense or connect to one another. Per recent notes, patient with increased emotional stress recently. No hx of psychiatric disorders prior to last week. She does have past medical history significant for HIV that was untreated from 2006 to recently. She was restarted on HAART mid February and dapsone, valtrex, diflucan, and azithromycin for prophylaxis. She lost insurance and was lost to follow up until then. Last CD4 at that time was < 10 with viral load of 700K. During medical evaluation for transfer to behavioral health it was noted that her sodium was 130 and she was admitted for medical clearance. She is unable to provide any history at this time as she goes on about random things and says a lot of things like "saturday has anointed me because it is in the future and has not happened yet and..." .   Hospital Course by problem list:  Acute Psychosis - Likely due to recent stressors in her life. Did use haldol as needed for medical restraint for agitation and her own safety. Per psychiatry recommendations, started olanzapine and benztropine.  She was initially set for transfer to  [facility], but no beds were available.  Other regional options were also explored, but no beds were found.  As the pt stayed at [facility], her mental status improved and her psychosis diminished.  She was eventually re-evaluated by psych and deemed appropriate for home d/c with close outpt f/u.  She will f/u the day following [facility].  She will con't on benztropine and olanzapine at d/c.  Hyponatremia - Sodium was 129 at time of admission due to volume contraction and did correct overnight with IV fluids. Stable the rest of the stay while she was taking in good PO intake. Stable at time of discharge. Last sodium collected value was 139.  Hypokalemia - Due to poor PO intake her potassium  did go down to 2.8 which was repleted and then rebounded once she started taking in food again. Stable at time of discharge at 3.9.   Anemia of Chronic Disease - Baseline Hg is 8.5 and she came in slightly lower than that which did decrease slightly with dilution from IV fluids. She was given one unit of PRBCs during this admission and was stable afterwards. She did not have any signs or symptoms of active bleeding and no symptomatic anemia. Was at Hg of 8.7 and was stable for 24 hours post transfusion so did not check further as no signs of active blood loss.  HIV positive - Last CD4 count was <10 and she had viral load of 700K in mid February. She is now on HAART and is on valtrex, diflucan, azithromycin, dapsone for prophylaxis due to low CD4 count.  She was d/c'ed on her HAART regimen and prophylactic drugs, and she has scheduled f/u with Dr. Orvan Falconer for later this month.  While previously noncompliant with meds, she wants to be more active in her disease management at this time.   Discharge Vitals:  BP 124/90  Pulse 115  Temp(Src) 98.7 F (37.1 C) (Oral)  Resp 18  Ht 6' (1.829 m)  Wt 144 lb 6.4 oz (65.5 kg)  BMI 19.58 kg/m2  SpO2 97%  LMP 02/10/2012  Discharge Labs:  No results  found for this or any previous visit (from the past 24 hour(s)).  SignedAbner Greenspan, BEN 03/17/2012, 11:21 AM

## 2012-03-13 NOTE — Progress Notes (Signed)
Subjective: Pt is eating breakfast while I talk to her and wants to clarify that she is allergic only to shellfish not all seafood and wants tuna for lunch. Will clarify. Did talk to her about xxx status and that she only needs to tell the room number to visitors she wishes to have. Did get blood yesterday and doing well today. Is sleeping okay. No outbreaks, no restraints needed in last 24 hours.   Objective: Vital signs in last 24 hours: Filed Vitals:   03/12/12 1807 03/12/12 1850 03/12/12 2235 03/13/12 0514  BP: 119/73 121/72 128/76 116/72  Pulse: 82 84 76 71  Temp: 97.3 F (36.3 C) 97.4 F (36.3 C) 97.8 F (36.6 C) 98.5 F (36.9 C)  TempSrc: Oral Oral Oral Oral  Resp: 18 19 18 17   Height:   6' (1.829 m)   Weight:   140 lb 6.9 oz (63.7 kg)   SpO2:   99% 99%   Weight change: -4 lb 10.7 oz (-2.117 kg)  Intake/Output Summary (Last 24 hours) at 03/13/12 0853 Last data filed at 03/12/12 1200  Gross per 24 hour  Intake    480 ml  Output      0 ml  Net    480 ml   Physical Exam: General:sitting in chair, eating breakfast, sitter in room HEENT: EOM intact  Cardiac: patient was not cooperative with exam  Pulm: patient was not cooperative with exam  Abd: patient was not cooperative with exam Ext: no edema noted Neuro: was able to answer questions, knew her name and at hospital, moving all four extremities, slightly slow thinking  Lab Results: Basic Metabolic Panel:  Lab 03/12/12 4401 03/11/12 0908  NA 139 136  K 3.9 3.4*  CL 106 104  CO2 23 22  GLUCOSE 99 103*  BUN 4* 7  CREATININE 0.56 0.72  CALCIUM 9.0 8.7  MG -- --  PHOS -- --   Liver Function Tests:  Lab 03/12/12 0545 03/11/12 0600  AST 40* 43*  ALT 18 18  ALKPHOS 65 69  BILITOT 0.6 0.8  PROT 7.0 7.2  ALBUMIN 3.2* 3.4*   CBC:  Lab 03/13/12 0535 03/12/12 0545 03/10/12 0958  WBC 2.9* 1.9* --  NEUTROABS -- -- 4.3  HGB 8.3* 7.0* --  HCT 25.7* 22.3* --  MCV 68.2* 65.8* --  PLT 185 190 --   Thyroid  Function Tests:  Lab 03/10/12 1938  TSH 3.863  T4TOTAL --  FREET4 --  T3FREE --  THYROIDAB --   Urine Drug Screen: Drugs of Abuse     Component Value Date/Time   LABOPIA NONE DETECTED 03/10/2012 1015   COCAINSCRNUR NONE DETECTED 03/10/2012 1015   LABBENZ NONE DETECTED 03/10/2012 1015   AMPHETMU NONE DETECTED 03/10/2012 1015   THCU NONE DETECTED 03/10/2012 1015   LABBARB NONE DETECTED 03/10/2012 1015    Alcohol Level:  Lab 03/10/12 0958  ETH <11   Urinalysis:  Lab 03/10/12 1015  COLORURINE YELLOW  LABSPEC 1.009  PHURINE 6.0  GLUCOSEU NEGATIVE  HGBUR NEGATIVE  BILIRUBINUR NEGATIVE  KETONESUR NEGATIVE  PROTEINUR NEGATIVE  UROBILINOGEN 0.2  NITRITE NEGATIVE  LEUKOCYTESUR NEGATIVE   Micro Results: Recent Results (from the past 240 hour(s))  URINE CULTURE     Status: Normal   Collection Time   03/04/12  3:08 AM      Component Value Range Status Comment   Specimen Description URINE, CLEAN CATCH   Final    Special Requests NONE   Final  Culture  Setup Time [id number]   Final    Colony Count 25,000 COLONIES/ML   Final    Culture     Final    Value: Multiple bacterial morphotypes present, none predominant. Suggest appropriate recollection if clinically indicated.   Report Status 03/06/2012 FINAL   Final    Studies/Results: No results found. Medications: I have reviewed the patient's current medications. Scheduled Meds:    . azithromycin  1,200 mg Oral Weekly  . benztropine  1 mg Oral BID  . dapsone  100 mg Oral Daily  . darunavir  800 mg Oral Q breakfast  . emtricitabine-tenofovir  1 tablet Oral Daily  . enoxaparin  40 mg Subcutaneous Q24H  . fluconazole  100 mg Oral Weekly  . OLANZapine zydis  20 mg Oral Daily  . pantoprazole  40 mg Oral Q1200  . ritonavir  100 mg Oral Q breakfast  . valACYclovir  500 mg Oral BID   Continuous Infusions:  PRN Meds:.acetaminophen, acetaminophen, haloperidol, haloperidol lactate, ondansetron (ZOFRAN) IV,  ondansetron Assessment/Plan:  Acute psychosis - Per psych. They will take to behavioral health and will coordinate to see if there is a bed today.  Did start one of their treatment options. Will follow.   Hyponatremia - resolved, due to volume contraction   Drop in Hg - Hg levels today are 8.3 today and no signs, symptoms of active bleeding. Would not do EGD in this acute setting if blood counts are stable. Baseline is around 8.5 from anemia of chronic disease. Work up was done about 1 week ago. Did get 1 unit of PRBC yesterday and will recheck one more CBC tomorrow if she is still here. Is stable to go to [facility] today.  Hypokalemia - K is 3.9, resolved. Likely secondary to poor PO intake.   Disposition - Stable for [facility] transfer today if bed available. If she is still here tomorrow will check CBC. Otherwise no medical treatment needed at this time. Continuing current treatment for acute psychosis per psych recommendations.    LOS: 3 days   Genella Mech 03/13/2012, 8:53 AM

## 2012-03-14 LAB — CBC
HCT: 26.8 % — ABNORMAL LOW (ref 36.0–46.0)
MCH: 22.4 pg — ABNORMAL LOW (ref 26.0–34.0)
MCV: 69.1 fL — ABNORMAL LOW (ref 78.0–100.0)
Platelets: 206 10*3/uL (ref 150–400)
RBC: 3.88 MIL/uL (ref 3.87–5.11)

## 2012-03-14 MED ORDER — WHITE PETROLATUM GEL
Status: AC
  Administered 2012-03-14: 18:00:00
  Filled 2012-03-14: qty 5

## 2012-03-14 NOTE — Progress Notes (Signed)
CSW spoke with [facility] who stated there was not a bed available today. Milus Banister MSW,LCSW w/e Coverage [phone]

## 2012-03-14 NOTE — Progress Notes (Signed)
Subjective: Pt is sleeping when I enter and wakes up and answers my questions. Not in any pain, no needs at this time. No complaints. Wants juice and ice so I got those for her. Will stop back later when she is more awake.    Objective: Vital signs in last 24 hours: Filed Vitals:   03/13/12 1322 03/13/12 1708 03/13/12 2116 03/14/12 0500  BP: 109/72 128/82 133/82 115/71  Pulse: 87 94 73 91  Temp: 98.9 F (37.2 C) 99.4 F (37.4 C) 98.4 F (36.9 C) 99.6 F (37.6 C)  TempSrc: Oral     Resp: 18 16 18 17   Height:      Weight:      SpO2: 96% 95% 100% 99%   Weight change:   Intake/Output Summary (Last 24 hours) at 03/14/12 0758 Last data filed at 03/13/12 1428  Gross per 24 hour  Intake    960 ml  Output      0 ml  Net    960 ml   Physical Exam: General:lying in bed sleeping, no sitter HEENT: EOM intact  Cardiac: patient was not cooperative with exam  Pulm: patient was not cooperative with exam  Abd: patient was not cooperative with exam Ext: no edema noted Neuro: was able to answer questions, knew her name and at hospital, moving all four extremities, slightly slow thinking  Lab Results: Basic Metabolic Panel:  Lab 03/12/12 4132 03/11/12 0908  NA 139 136  K 3.9 3.4*  CL 106 104  CO2 23 22  GLUCOSE 99 103*  BUN 4* 7  CREATININE 0.56 0.72  CALCIUM 9.0 8.7  MG -- --  PHOS -- --   CBC:  Lab 03/14/12 0700 03/13/12 0535 03/10/12 0958  WBC 3.8* 2.9* --  NEUTROABS -- -- 4.3  HGB 8.7* 8.3* --  HCT 26.8* 25.7* --  MCV 69.1* 68.2* --  PLT 206 185 --  Medications: I have reviewed the patient's current medications. Scheduled Meds:    . azithromycin  1,200 mg Oral Weekly  . benztropine  1 mg Oral BID  . dapsone  100 mg Oral Daily  . darunavir  800 mg Oral Q breakfast  . emtricitabine-tenofovir  1 tablet Oral Daily  . enoxaparin  40 mg Subcutaneous Q24H  . fluconazole  100 mg Oral Weekly  . OLANZapine zydis  20 mg Oral Daily  . pantoprazole  40 mg Oral Q1200  .  ritonavir  100 mg Oral Q breakfast  . valACYclovir  500 mg Oral BID   Continuous Infusions:  PRN Meds:.acetaminophen, acetaminophen, haloperidol, haloperidol lactate, ondansetron (ZOFRAN) IV, ondansetron Assessment/Plan:  Acute psychosis - Per psych. They will take to behavioral health and will coordinate to see if there is a bed today.  Did start one of their treatment options. Will follow. Doing better but still needs in-pt stay.   Hyponatremia - resolved, due to volume contraction   Drop in Hg - Hg levels today are 8.7 today and no signs, symptoms of active bleeding. Baseline is around 8.5 from anemia of chronic disease. Work up was done about 1 week ago. Did get 1 unit of PRBC while in hospital however Hg have been stable for 24 hours with no signs of bleeding and will not recheck CBC any longer.  Hypokalemia - K is 3.9, resolved. Likely secondary to poor PO intake.   Disposition - Stable for [facility] transfer today if bed available. If she is still here tomorrow will follow. Otherwise no acute medical treatment  needed at this time. Continuing current treatment for acute psychosis per psych recommendations and HIV treatment and prophylaxis.    LOS: 4 days   Genella Mech 03/14/2012, 7:58 AM

## 2012-03-14 NOTE — BH Assessment (Signed)
Assessment Note   Milus Banister, LCSW, requested Pt be reviewed for transfer to adult inpatient psychiatric unit at [facility]. Consulted with Theodoro Kos, [facility] who said there is not an appropriate bed available at this time. Pt will be reviewed by [facility] psychiatrist when bed is available. Notified [employer] of bed situation.     Patsy Baltimore, Harlin Rain 03/14/2012 3:28 PM

## 2012-03-14 NOTE — Progress Notes (Signed)
CSW spoke with [facility].  Notes from 03/14/12 faxed to [facility]. Milus Banister MSW,LCSW w/e Coverage [phone]

## 2012-03-15 MED ORDER — ALBUTEROL SULFATE HFA 108 (90 BASE) MCG/ACT IN AERS
1.0000 | INHALATION_SPRAY | Freq: Four times a day (QID) | RESPIRATORY_TRACT | Status: DC | PRN
  Filled 2012-03-15: qty 6.7

## 2012-03-15 MED ORDER — ALBUTEROL SULFATE (5 MG/ML) 0.5% IN NEBU: 2.5000 mg | INHALATION_SOLUTION | Freq: Every evening | RESPIRATORY_TRACT | Status: DC | PRN

## 2012-03-15 MED ORDER — ALBUTEROL SULFATE (5 MG/ML) 0.5% IN NEBU
2.5000 mg | INHALATION_SOLUTION | Freq: Every day | RESPIRATORY_TRACT | Status: DC
  Administered 2012-03-15 – 2012-03-16 (×2): 2.5 mg via RESPIRATORY_TRACT
  Filled 2012-03-15 (×2): qty 0.5

## 2012-03-15 NOTE — Progress Notes (Signed)
Subjective: Pt is eating breakfast when I enter and wakes up and answers my questions. Not in any pain, no needs at this time. No complaints. Did reinforce that she needs to go to behavioral health and answered questions.   Objective: Vital signs in last 24 hours: Filed Vitals:   03/14/12 1328 03/14/12 1805 03/14/12 2137 03/15/12 0734  BP: 144/96 133/89 143/97 160/78  Pulse: 110 100 83 103  Temp: 98.1 F (36.7 C) 98.1 F (36.7 C) 98.3 F (36.8 C) 99.2 F (37.3 C)  TempSrc: Oral Oral Oral Oral  Resp: 16 16 18 18   Height:      Weight:      SpO2: 100% 100% 97% 94%   Weight change:   Intake/Output Summary (Last 24 hours) at 03/15/12 0949 Last data filed at 03/15/12 0130  Gross per 24 hour  Intake   1220 ml  Output      0 ml  Net   1220 ml   Physical Exam: General:sitting up in bed, no sitter HEENT: EOM intact  Cardiac: patient was not cooperative with exam  Pulm: patient was not cooperative with exam  Abd: patient was not cooperative with exam Ext: no edema noted Neuro: was able to answer questions, knew her name and at hospital, moving all four extremities, slightly slow thinking  Lab Results: Basic Metabolic Panel:  Lab 03/12/12 1610 03/11/12 0908  NA 139 136  K 3.9 3.4*  CL 106 104  CO2 23 22  GLUCOSE 99 103*  BUN 4* 7  CREATININE 0.56 0.72  CALCIUM 9.0 8.7  MG -- --  PHOS -- --   CBC:  Lab 03/14/12 0700 03/13/12 0535 03/10/12 0958  WBC 3.8* 2.9* --  NEUTROABS -- -- 4.3  HGB 8.7* 8.3* --  HCT 26.8* 25.7* --  MCV 69.1* 68.2* --  PLT 206 185 --  Medications: I have reviewed the patient's current medications. Scheduled Meds:    . azithromycin  1,200 mg Oral Weekly  . benztropine  1 mg Oral BID  . dapsone  100 mg Oral Daily  . darunavir  800 mg Oral Q breakfast  . emtricitabine-tenofovir  1 tablet Oral Daily  . enoxaparin  40 mg Subcutaneous Q24H  . fluconazole  100 mg Oral Weekly  . OLANZapine zydis  20 mg Oral Daily  . pantoprazole  40 mg Oral  Q1200  . ritonavir  100 mg Oral Q breakfast  . valACYclovir  500 mg Oral BID  . white petrolatum       Continuous Infusions:  PRN Meds:.acetaminophen, acetaminophen, haloperidol, haloperidol lactate, ondansetron (ZOFRAN) IV, ondansetron Assessment/Plan:  Acute psychosis - Per psych. They will take to behavioral health and will coordinate to see if there is a bed today.  Did start one of their treatment options. Will follow. Doing better but still needs in-pt stay.   Hyponatremia - resolved, due to volume contraction   Drop in Hg - Hg levels today are 8.7 and no signs, symptoms of active bleeding. Baseline is around 8.5 from anemia of chronic disease. Work up was done about 1 week ago. Did get 1 unit of PRBC while in hospital however Hg have been stable for greater than 24 hours with no signs of bleeding and will not recheck CBC any longer.  Hypokalemia - K is 3.9, resolved. Likely secondary to poor PO intake.   Disposition - Stable for [facility] transfer today if bed available. If she is still here tomorrow will follow. Otherwise no acute medical  treatment needed at this time. Continuing current treatment for acute psychosis per psych recommendations and HIV treatment and prophylaxis.    LOS: 5 days   Genella Mech 03/15/2012, 9:49 AM

## 2012-03-16 ENCOUNTER — Ambulatory Visit: Payer: Medicaid Other | Admitting: Internal Medicine

## 2012-03-16 MED ORDER — WHITE PETROLATUM GEL
Status: AC
  Administered 2012-03-16: 09:00:00
  Filled 2012-03-16: qty 5

## 2012-03-16 NOTE — Progress Notes (Signed)
Spoke with pt regarding her current care.  Pt frustrated that she cannot go to [facility] or equivalent.  Also stated she would like for team not to share information with her sister Sunny Schlein.  Pt seems reasonably organized at this time.  Sister is not DPOA as far as I know, so we will grant this pt's wishes in this area.

## 2012-03-16 NOTE — Progress Notes (Signed)
Utilization review completed. Billy Rocco, RN, BSN. 03/16/12  

## 2012-03-16 NOTE — Progress Notes (Signed)
CSW spoke with Dr. Ferol Luz re: pt's admission to [facility]. Dr. Ferol Luz shared that [facility] is concerned with Pt's medical acuity specifically her hematocrit stability. Dr. Ferol Luz also shared that the acuity level has increased on the unit at [facility] and they will not be able to take Pt at the time.  CSW shared information with MD. CSW has contacted [facility] and [facility] re: availability. Facilities report no beds at this time and to check back in the am. CSW contacted Dr. Ferol Luz for a f/u note as well, as pt's nurses report that some symptoms have improved.  Challenges to Pt's inpt psych placement at this time are limited space and payor source.   Frederico Hamman, LCSW [phone]

## 2012-03-16 NOTE — Progress Notes (Signed)
CSW spoke to [place] at [facility] and they have pt but no beds this morning. CSW will call at noon to check for availability again once units have discharges.  Frederico Hamman, LCSW [phone] covering for psych CSW

## 2012-03-16 NOTE — Progress Notes (Signed)
Subjective: Diaphoretic upon awakening this morning.  Room is quite warm.  Pt has no other c/o other than being hot.  Seems to answer questions reasonably.  Objective: Vital signs in last 24 hours: Filed Vitals:   03/15/12 1951 03/15/12 2147 03/15/12 2205 03/16/12 0605  BP: 120/68 132/89  98/60  Pulse: 87 89  75  Temp: 98.6 F (37 C) 98.6 F (37 C)  98.6 F (37 C)  TempSrc: Oral Oral  Oral  Resp: 19 20  19   Height:      Weight:      SpO2: 98% 96% 94% 96%   Weight change:   Intake/Output Summary (Last 24 hours) at 03/16/12 1610 Last data filed at 03/15/12 1300  Gross per 24 hour  Intake    480 ml  Output      0 ml  Net    480 ml   Physical Exam: General:sitting up in bed, no sitter, diaphoretic HEENT: EOM intact  Cardiac: patient was not cooperative with exam  Pulm: patient was not cooperative with exam  Abd: patient was not cooperative with exam Ext: no edema noted Neuro: was able to answer questions, knew her name and at hospital, moving all four extremities  Lab Results: Basic Metabolic Panel:  Lab 03/12/12 9604 03/11/12 0908  NA 139 136  K 3.9 3.4*  CL 106 104  CO2 23 22  GLUCOSE 99 103*  BUN 4* 7  CREATININE 0.56 0.72  CALCIUM 9.0 8.7  MG -- --  PHOS -- --   CBC:  Lab 03/14/12 0700 03/13/12 0535 03/10/12 0958  WBC 3.8* 2.9* --  NEUTROABS -- -- 4.3  HGB 8.7* 8.3* --  HCT 26.8* 25.7* --  MCV 69.1* 68.2* --  PLT 206 185 --  Medications: I have reviewed the patient's current medications. Scheduled Meds:    . albuterol  2.5 mg Nebulization QHS  . azithromycin  1,200 mg Oral Weekly  . benztropine  1 mg Oral BID  . dapsone  100 mg Oral Daily  . darunavir  800 mg Oral Q breakfast  . emtricitabine-tenofovir  1 tablet Oral Daily  . enoxaparin  40 mg Subcutaneous Q24H  . fluconazole  100 mg Oral Weekly  . OLANZapine zydis  20 mg Oral Daily  . pantoprazole  40 mg Oral Q1200  . ritonavir  100 mg Oral Q breakfast  . valACYclovir  500 mg Oral BID    Continuous Infusions:  PRN Meds:.acetaminophen, acetaminophen, albuterol, haloperidol, haloperidol lactate, ondansetron (ZOFRAN) IV, ondansetron, DISCONTD: albuterol  Assessment/Plan: Acute psychosis - Per psych. They will take to behavioral health when bed becomes available.  Pt is medically stable for transfer. - con't beztropine - con't olanzapine  Hyponatremia - resolved, due to volume contraction   Drop in Hb - Hb levels 8.7 on Saturday and no signs, symptoms of active bleeding. Did get 1 unit of PRBC on 4/28.  Baseline is around 8.5 from anemia of chronic disease. Work up was done about 1 week ago.  Hb was stable for greater than 24 hours with no signs of bleeding and will not recheck CBC any longer. - no further management  Hypokalemia - K is 3.9, resolved. Likely secondary to poor PO intake.   Disposition - Stable for [facility] transfer if bed available.  If she is still here tomorrow will follow. Otherwise no acute medical treatment needed at this time. Continuing current treatment for acute psychosis per psych recommendations and HIV treatment and prophylaxis.  LOS: 6 days   [facility], BEN 03/16/2012, 8:32 AM

## 2012-03-17 NOTE — Progress Notes (Signed)
CSW was notified by Dr. Ferol Luz that she has re-evaluated the pt and feel that the Pt's acute psychiatric symptoms have been cleared and that Pt will need outpt tx at this time.  CSW and Pt discussed suitable plan for outpt tx and decided on [address], [place] the [place]. Pt is agreeable to going to [facility] the [place] on 03/18/12 during walk-in hours in order to have the quickest appt possible.  Pt described multiple stressors and demonstrated understanding of the need for ongoing support as she adjust to her current stressors of job loss, parenting, and chronic disease management.  Pt has support from family, though pt believes that they can be "too overbearing" at times.    Pt discussed acceptance issues with her HIV status and that she is becoming more willing to participate in disease management routines and seek outside support. Her children (ages 4 and 7) are unaware of her status and she wants support when it comes time to disclose this information.  Pt is familiar with [facility], this CSW offered more detailed information about organization and encouraged Pt to f/u for a variety of support services.   CSW and Pt also discussed HC POA and Advance Directives as Pt has never completed these forms and has concerns with certain family members directing her care.  CSW encouraged Pt to look over forms and give copies to providers.    CSW discussed the above with the resident MD and Dr. Ferol Luz, psychiatrist.  No further CSW concerns at this time.    Frederico Hamman, LCSW [phone]

## 2012-03-17 NOTE — Progress Notes (Signed)
Subjective: No events overnight.  No other c/o.  Objective: Vital signs in last 24 hours: Filed Vitals:   03/16/12 1800 03/16/12 2000 03/16/12 2033 03/17/12 0524  BP: 115/78 124/82  105/66  Pulse: 74 82  85  Temp: 97.8 F (36.6 C) 97.8 F (36.6 C)  98.7 F (37.1 C)  TempSrc: Oral Oral  Oral  Resp: 18 18  18   Height:      Weight:  144 lb 6.4 oz (65.5 kg)    SpO2: 98% 100% 100% 94%   Physical Exam: General:sitting up in bed, no sitter Psych: slightly pressured speech but appropriate, voices understanding of current situation  Lab Results: Basic Metabolic Panel:  Lab 03/12/12 8469 03/11/12 0908  NA 139 136  K 3.9 3.4*  CL 106 104  CO2 23 22  GLUCOSE 99 103*  BUN 4* 7  CREATININE 0.56 0.72  CALCIUM 9.0 8.7  MG -- --  PHOS -- --   CBC:  Lab 03/14/12 0700 03/13/12 0535 03/10/12 0958  WBC 3.8* 2.9* --  NEUTROABS -- -- 4.3  HGB 8.7* 8.3* --  HCT 26.8* 25.7* --  MCV 69.1* 68.2* --  PLT 206 185 --  Medications: I have reviewed the patient's current medications. Scheduled Meds:    . albuterol  2.5 mg Nebulization QHS  . azithromycin  1,200 mg Oral Weekly  . benztropine  1 mg Oral BID  . dapsone  100 mg Oral Daily  . darunavir  800 mg Oral Q breakfast  . emtricitabine-tenofovir  1 tablet Oral Daily  . enoxaparin  40 mg Subcutaneous Q24H  . fluconazole  100 mg Oral Weekly  . OLANZapine zydis  20 mg Oral Daily  . pantoprazole  40 mg Oral Q1200  . ritonavir  100 mg Oral Q breakfast  . valACYclovir  500 mg Oral BID  . white petrolatum       Continuous Infusions:  PRN Meds:.acetaminophen, acetaminophen, albuterol, haloperidol, haloperidol lactate, ondansetron (ZOFRAN) IV, ondansetron  Assessment/Plan: Acute psychosis Seen by psych this a.m., approved for d/c to home.  Will f/u tomorrow at [facility] [place]. - appreciate psych input - con't beztropine - con't olanzapine  Drop in Hb Hb 8.7 on Saturday with no signs, symptoms of active bleeding. Did  get 1 unit of PRBC on 4/28.  Baseline is around 8.5 from anemia of chronic disease. Work up was done about 1 week ago.  Hb was stable for greater than 24 hours with no signs of bleeding and will not recheck CBC any longer. - no further management  HIV - con't HIV tx - con't HIV prophylaxis  Hyponatremia Resolved, likely due to volume contraction. Other etiologies include SIADH, but resolved with gentle hydration.  Hypokalemia K is 3.9, resolved. Likely secondary to poor PO intake.    LOS: 7 days   WILDMAN-TOBRINER, BEN 03/17/2012, 7:03 AM

## 2012-03-18 NOTE — Consult Note (Signed)
Reason for Consult:Acute Psychosis Referring Physician: dr. Ishmael Holter [name] is an 36 y.o. female.  HPI: Pt is in a state of decompensation  She is gesticulating with unusual arm movements, moving her legs.  Her eyes are shut and she is uttering sounds and occasional phrases with hyper-religious themes.  She is so uncontrollable and and unable to respond to redirection that she is temporarily restrained to protect herself and others.  She has received medical treatment for chronic anemia and for untreated HIV with significant viral load.  She has gradually become calm, coherent regained command fo language  and is cooperative.    AXIS Madison [Psychosis, reactive, acute r/o infectious delirium  AXIS II Deferred AXIS III Past Medical History  Diagnosis Date  . HIV positive   . Hepatitis B     /E-chart  . Microcytic anemia     h/o per E-chart  . Pyelonephritis     h/o per E-chart  . 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 Noncompliance with medication regimen     /e-chart  . Acute psychosis 03/10/12    2nd admission in last wk for this    Past Surgical History  Procedure Date  . Thyroid surgery   . Thyroid lobectomy 08/2002    left & isthmectomy; for benign thyroid adenoma/E-chart  . Tubal ligation 07/2002    /E-chart  AXIS IV Parenting issues, unemployment, legal issues, loss of health insurance, unavailable health care, medical problems  No family history on file.  Social History:  reports that she has never smoked. She has never used smokeless tobacco. She reports that she drinks alcohol. She reports that she does not use illicit drugs.  Allergies:  Allergies  Allergen Reactions  . Bactrim Itching  . Orange Fruit Itching  . Peanut-Containing Drug Products Hives  . Shellfish Allergy Hives    Medications: Madison have reviewed the patient's current medications.  No results found for this or any previous visit (from the past 48  hour(s)).  No results found.  Review of Systems  Unable to perform ROS: other   Blood pressure 124/90, pulse 115, temperature 98.7 F (37.1 C), temperature source Oral, resp. rate 18, height 6' (1.829 m), weight 65.5 kg (144 lb 6.4 oz), last menstrual period 02/10/2012, SpO2 97.00%. Physical Exam  Assessment/Plan:  Chart reviewed, Discussed with [facility] CSW,  Pt is interviewed 03/17/12 ~ 8:30 am  Pt is sitting up eating breakfast.  She is neatly groomed.  She has good eye contact and spontaneous speech of normal rate.  She has a full affect and feels much better.  She explains her behavior as a "nervous breakdown from all the stress Madison've been through".  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  She says he first cancelled health insurance and that is why she could not afford her medications she needed.  Last fall he fired her.  She has a court hearing this week.  She says she has a briefcase full of documentation.  She says she has two children who have been staying with their respective fathers while she has been in the hospital.  She also has a sister.  She wants to return to college and earn her degree in business administration.  She says she will continue to take her medications now that they have been started.  She will need assistance until she  finds work again.  She agrees to see a psychiatrist and community services for health concerns and support.  She denies any SI/HI.  She says she was having visions of her deceased grandmother and father.  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  This may have some relevance to the comments she was making that sounded religious with phrases about light [only conjecture] RECOMMENDATION 1. She has full capacity to make  decisions on her behalf 2. She is to be discharged to outpatient psychiatric, community services when medically stable for discharge.  3. Discussed with Dr. Robb Matar . 4. No further psychiatric needs identified  MD Psychiatrist signs off.  [name] 03/18/2012, 12:19 PM

## 2012-03-23 ENCOUNTER — Other Ambulatory Visit (INDEPENDENT_AMBULATORY_CARE_PROVIDER_SITE_OTHER): Payer: Medicaid Other

## 2012-03-23 DIAGNOSIS — Z79899 Other long term (current) drug therapy: Secondary | ICD-10-CM

## 2012-03-23 DIAGNOSIS — B20 Human immunodeficiency virus [HIV] disease: Secondary | ICD-10-CM

## 2012-03-23 DIAGNOSIS — Z113 Encounter for screening for infections with a predominantly sexual mode of transmission: Secondary | ICD-10-CM

## 2012-03-23 DIAGNOSIS — O98519 Other viral diseases complicating pregnancy, unspecified trimester: Secondary | ICD-10-CM

## 2012-03-23 LAB — CBC
MCH: 21.4 pg — ABNORMAL LOW (ref 26.0–34.0)
MCHC: 30.1 g/dL (ref 30.0–36.0)
Platelets: 344 10*3/uL (ref 150–400)

## 2012-03-23 LAB — LIPID PANEL
Cholesterol: 164 mg/dL (ref 0–200)
VLDL: 23 mg/dL (ref 0–40)

## 2012-03-23 LAB — TOXOPLASMA ANTIBODIES- IGG AND  IGM
Toxoplasma Antibody- IgM: 0.1 IV (ref <0.90)
Toxoplasma IgG Ratio: 1.1 IU/mL (ref <6.4)

## 2012-03-23 LAB — COMPREHENSIVE METABOLIC PANEL
AST: 23 U/L (ref 0–37)
Albumin: 4 g/dL (ref 3.5–5.2)
BUN: 9 mg/dL (ref 6–23)
Calcium: 9.5 mg/dL (ref 8.4–10.5)
Sodium: 135 mEq/L (ref 135–145)

## 2012-03-24 LAB — T-HELPER CELL (CD4) - (RCID CLINIC ONLY): CD4 T Cell Abs: <10 uL — ABNORMAL LOW (ref 400–2700)

## 2012-03-25 LAB — HIV-1 RNA QUANT-NO REFLEX-BLD: HIV 1 RNA Quant: 3824 copies/mL — ABNORMAL HIGH (ref <20)

## 2012-04-01 ENCOUNTER — Encounter: Payer: Self-pay | Admitting: Internal Medicine

## 2012-04-01 ENCOUNTER — Ambulatory Visit (INDEPENDENT_AMBULATORY_CARE_PROVIDER_SITE_OTHER): Payer: Medicaid Other | Admitting: Internal Medicine

## 2012-04-01 VITALS — BP 123/82 | HR 94 | Temp 98.6°F | Ht 72.0 in | Wt 147.5 lb

## 2012-04-01 DIAGNOSIS — F319 Bipolar disorder, unspecified: Secondary | ICD-10-CM | POA: Insufficient documentation

## 2012-04-01 DIAGNOSIS — B20 Human immunodeficiency virus [HIV] disease: Secondary | ICD-10-CM

## 2012-04-01 NOTE — Progress Notes (Signed)
Patient ID: [name], female   DOB: April 06, 1976, 36 y.o.   MRN: [id number]  INFECTIOUS DISEASE PROGRESS NOTE    Subjective: [name] is in for her routine visit. She was hospitalized recently with complications of her bipolar disorder. She had started her HIV medication and it was continued in the hospital when she was able to take oral medications. She has some trouble with the better taste of her pills but states that if she takes it with yogurt helps. She did not take her medications this morning because she says she has been extremely stressed because she has been unable to get any sleep. She was started on Zyprexa one week ago but states that it is not helping. She has an appointment back at the [facility] counseling center in 2 weeks.  Objective: Temp: 98.6 F (37 C) (04/17 1511) Temp src: Oral (04/17 1511) BP: 123/82 mmHg (04/17 1511) Pulse Rate: 94  (04/17 1511)  General: Tearful but alert and appropriate Skin: No rash Lungs: Clear Cor: Regular S1 and S2 and no murmurs Abdomen: Nontender   Lab Results HIV 1 RNA Quant (copies/mL)  Date Value  03/23/2012 3824*  01/30/2012 308657*  10/16/2005 14100 (?)      CD4 T Cell Abs (cmm)  Date Value  03/23/2012 <10*  01/30/2012 <10*  02/15/2009 10*     Assessment: I talked to her about adherence and she does seem more committed to taking her medications. Her viral load was already down dramatically after only a short time on the medications. She is very pleased about this.  I encouraged her to go by her counseling center and let them know that she has been unable to sleep.  Plan: 1. Continue her current HIV medications (Truvada, Prezista, Norvir and dapsone) 2. Return to clinic in 2 weeks   Cliffton Asters, MD [facility] for Infectious Diseases [facility] [phone] pager   [phone] cell 04/01/2012, 3:34 PM

## 2012-04-02 ENCOUNTER — Telehealth: Payer: Self-pay | Admitting: *Deleted

## 2012-04-02 MED ORDER — ZOLPIDEM TARTRATE 5 MG PO TABS: 5.0000 mg | ORAL_TABLET | Freq: Every evening | ORAL | Status: DC | PRN

## 2012-04-02 NOTE — Telephone Encounter (Signed)
Ambien ordered

## 2012-04-02 NOTE — Telephone Encounter (Signed)
She asked that the doctor call in a med for sleep. States she told md about this yesterday at her visit. She has a pcp at [facility] but has not been there in a while. Wants her md here to send this in today. Her cell is [phone]. I told her her md will be here will be here this pm. I will call her with the response. I asked about the pharmacy & she said "Angelique Blonder knows"

## 2012-04-06 ENCOUNTER — Ambulatory Visit: Payer: Medicaid Other | Admitting: Internal Medicine

## 2012-04-16 ENCOUNTER — Encounter: Payer: Self-pay | Admitting: Internal Medicine

## 2012-04-16 ENCOUNTER — Ambulatory Visit (INDEPENDENT_AMBULATORY_CARE_PROVIDER_SITE_OTHER): Payer: Medicaid Other | Admitting: Internal Medicine

## 2012-04-16 VITALS — BP 131/82 | HR 82 | Temp 98.0°F | Ht 72.0 in | Wt 155.0 lb

## 2012-04-16 DIAGNOSIS — B20 Human immunodeficiency virus [HIV] disease: Secondary | ICD-10-CM

## 2012-04-16 NOTE — Progress Notes (Signed)
Patient ID: [name], female   DOB: [birth year], 36 y.o.   MRN: [id number]  INFECTIOUS DISEASE PROGRESS NOTE    Subjective: [name] is in for her routine visit. She is feeling better and states that she's had relatively few problems tolerating her HIV medications. She has missed a few doses in the past 2 weeks when she was very sleepy in the morning and did not take them. It sounds like she was aware that she had missed. She is using a pill box. She believes her Zyprexa is making her sleepy and so she has only been taking it every other day. This is a reversal over the past 2 weeks as at the time of her last visit she was complaining of insomnia. She is due to see her counselor today to discuss the concerns about her Zyprexa.  Objective: Temp: 98 F (36.7 C) (05/02 0919) Temp src: Oral (05/02 0919) BP: 131/82 mmHg (05/02 0919) Pulse Rate: 82  (05/02 0919)  General: She is alert and smiling and appears to be in good spirits Skin: No rash Lungs: Clear Cor: Regular S1-S2 no murmurs  Lab Results HIV 1 RNA Quant (copies/mL)  Date Value  03/23/2012 3824*  01/30/2012 401027*  10/16/2005 14100 (?)      CD4 T Cell Abs (cmm)  Date Value  03/23/2012 <10*  01/30/2012 <10*  02/15/2009 10*     Assessment: She is doing better and her bipolar disorder seems to be coming under control. I encouraged her to speak with her counselor today about the appropriate dosing of her Zyprexa. I also asked her to set herself him to remind her to take her antiretroviral medication and encouraged her to try not to Miss a single dose.  Plan: 1. Continue Truvada, Prezista and Norvir 2. Return after lab work in 6 weeks   Cliffton Asters, MD [facility] for Infectious Diseases [facility] [phone] pager   [phone] cell 04/16/2012, 9:32 AM

## 2012-06-02 ENCOUNTER — Other Ambulatory Visit: Payer: Medicaid Other

## 2012-06-16 ENCOUNTER — Telehealth: Payer: Self-pay | Admitting: *Deleted

## 2012-06-16 ENCOUNTER — Ambulatory Visit: Payer: Medicaid Other | Admitting: Internal Medicine

## 2012-06-16 NOTE — Telephone Encounter (Signed)
Spoke with patient and she stated that she had not been taking her medication as directed.  She also has been having problems with her other meds (mental health), claims they make her too sleepy and unable to function.  As a result she is not able to remember or keep up with regular day routine.  She is not wanting anyone to tell her what to do because she has been dealing with this issue (HIV) for over 13 years and will work it out when she is ready.  Gave additional information to agencies to help her such as [facility], [employer], and [facility].  She is yet to follow-up with them and will contact the clinic when she is ready to be seen. Does not want to disappoint Dr. Orvan Falconer so she chooses not to see him til she can get things in order.

## 2012-06-16 NOTE — Telephone Encounter (Signed)
Please let [name] know that she has not disappointed me and that I would like to see her even if she is not on her medications just to talk to her about what can be done to get her feeling better.

## 2012-06-16 NOTE — Telephone Encounter (Signed)
Message left asking pt to call for appts.  RN spoke with [name], [facility] Counselor about the pt.  RN requested that [employer] place a note in the record about her contacts with the pt.  Bridge Counselor stated that she would make a note.

## 2012-06-24 ENCOUNTER — Telehealth: Payer: Self-pay | Admitting: *Deleted

## 2012-06-24 NOTE — Telephone Encounter (Signed)
Done today.

## 2012-06-24 NOTE — Telephone Encounter (Signed)
RN left Dr. Blair Dolphin message for the pt to call for appt.  Number left for [facility].

## 2012-07-28 ENCOUNTER — Other Ambulatory Visit (INDEPENDENT_AMBULATORY_CARE_PROVIDER_SITE_OTHER): Payer: Self-pay

## 2012-07-28 ENCOUNTER — Ambulatory Visit: Payer: Medicaid Other

## 2012-07-28 DIAGNOSIS — B20 Human immunodeficiency virus [HIV] disease: Secondary | ICD-10-CM

## 2012-07-28 LAB — CBC
HCT: 28.6 % — ABNORMAL LOW (ref 36.0–46.0)
Hemoglobin: 9.1 g/dL — ABNORMAL LOW (ref 12.0–15.0)
MCH: 21 pg — ABNORMAL LOW (ref 26.0–34.0)
MCHC: 31.8 g/dL (ref 30.0–36.0)
RDW: 18.9 % — ABNORMAL HIGH (ref 11.5–15.5)

## 2012-07-28 LAB — COMPREHENSIVE METABOLIC PANEL
Alkaline Phosphatase: 81 U/L (ref 39–117)
BUN: 9 mg/dL (ref 6–23)
Glucose, Bld: 69 mg/dL — ABNORMAL LOW (ref 70–99)
Total Bilirubin: 0.5 mg/dL (ref 0.3–1.2)

## 2012-07-29 LAB — T-HELPER CELL (CD4) - (RCID CLINIC ONLY): CD4 % Helper T Cell: 1 % — ABNORMAL LOW (ref 33–55)

## 2012-07-30 LAB — HIV-1 RNA QUANT-NO REFLEX-BLD: HIV 1 RNA Quant: 253764 copies/mL — ABNORMAL HIGH (ref <20)

## 2012-08-11 ENCOUNTER — Ambulatory Visit: Payer: Medicaid Other | Admitting: Internal Medicine

## 2012-08-11 ENCOUNTER — Telehealth: Payer: Self-pay | Admitting: *Deleted

## 2012-08-11 NOTE — Telephone Encounter (Signed)
Message left to call [facility] to make a new appt w/ Dr. Orvan Falconer.

## 2012-10-28 ENCOUNTER — Telehealth: Payer: Self-pay | Admitting: *Deleted

## 2012-10-28 NOTE — Telephone Encounter (Signed)
Message left for pt.  Reason for call:  Follow-up:  Pt has No Showed for recent appts. Needs f/u appt. Will refer to [facility].

## 2012-11-19 ENCOUNTER — Telehealth: Payer: Self-pay | Admitting: Licensed Clinical Social Worker

## 2012-11-19 NOTE — Telephone Encounter (Signed)
Patient wanted a copy of her Hep B vaccinations. I placed the print out up front, and left the patient a message that she could pick it up.

## 2013-01-19 ENCOUNTER — Telehealth: Payer: Self-pay | Admitting: *Deleted

## 2013-01-19 NOTE — Telephone Encounter (Signed)
Pt states that she is taking her HIV medicines and knows she needs to make an appt w/ Dr. Orvan Falconer.  Currently having painful/burning lesions on perineum/anus prior/during menses.  RN agreed to see pt for PAP smear Monday, Feb 10 at 1000.  Needs to see B. Jeraldine Loots for [employer]

## 2013-01-21 ENCOUNTER — Encounter (HOSPITAL_COMMUNITY): Payer: Self-pay | Admitting: *Deleted

## 2013-01-21 ENCOUNTER — Emergency Department (HOSPITAL_COMMUNITY)
Admission: EM | Admit: 2013-01-21 | Discharge: 2013-01-21 | Disposition: A | Discharge status: Home / Self Care | Payer: Self-pay | Attending: Emergency Medicine | Admitting: Emergency Medicine

## 2013-01-21 DIAGNOSIS — N9089 Other specified noninflammatory disorders of vulva and perineum: Secondary | ICD-10-CM | POA: Insufficient documentation

## 2013-01-21 DIAGNOSIS — Z8659 Personal history of other mental and behavioral disorders: Secondary | ICD-10-CM | POA: Insufficient documentation

## 2013-01-21 DIAGNOSIS — Z8701 Personal history of pneumonia (recurrent): Secondary | ICD-10-CM | POA: Insufficient documentation

## 2013-01-21 DIAGNOSIS — Z862 Personal history of diseases of the blood and blood-forming organs and certain disorders involving the immune mechanism: Secondary | ICD-10-CM | POA: Insufficient documentation

## 2013-01-21 DIAGNOSIS — R21 Rash and other nonspecific skin eruption: Secondary | ICD-10-CM | POA: Insufficient documentation

## 2013-01-21 DIAGNOSIS — Z91199 Patient's noncompliance with other medical treatment and regimen due to unspecified reason: Secondary | ICD-10-CM | POA: Insufficient documentation

## 2013-01-21 DIAGNOSIS — B20 Human immunodeficiency virus [HIV] disease: Secondary | ICD-10-CM | POA: Insufficient documentation

## 2013-01-21 DIAGNOSIS — Z87448 Personal history of other diseases of urinary system: Secondary | ICD-10-CM | POA: Insufficient documentation

## 2013-01-21 DIAGNOSIS — Z9119 Patient's noncompliance with other medical treatment and regimen: Secondary | ICD-10-CM | POA: Insufficient documentation

## 2013-01-21 DIAGNOSIS — A6000 Herpesviral infection of urogenital system, unspecified: Secondary | ICD-10-CM | POA: Insufficient documentation

## 2013-01-21 DIAGNOSIS — Z791 Long term (current) use of non-steroidal anti-inflammatories (NSAID): Secondary | ICD-10-CM | POA: Insufficient documentation

## 2013-01-21 DIAGNOSIS — E079 Disorder of thyroid, unspecified: Secondary | ICD-10-CM | POA: Insufficient documentation

## 2013-01-21 DIAGNOSIS — Z79899 Other long term (current) drug therapy: Secondary | ICD-10-CM | POA: Insufficient documentation

## 2013-01-21 HISTORY — DX: Disorder of thyroid, unspecified: E07.9

## 2013-01-21 LAB — CBC WITH DIFFERENTIAL/PLATELET
Basophils Absolute: 0 10*3/uL (ref 0.0–0.1)
Basophils Relative: 0 % (ref 0–1)
Eosinophils Relative: 2 % (ref 0–5)
HCT: 28.9 % — ABNORMAL LOW (ref 36.0–46.0)
Hemoglobin: 9.3 g/dL — ABNORMAL LOW (ref 12.0–15.0)
Lymphocytes Relative: 30 % (ref 12–46)
Monocytes Relative: 12 % (ref 3–12)
Neutro Abs: 1.4 10*3/uL — ABNORMAL LOW (ref 1.7–7.7)
RBC: 4.37 MIL/uL (ref 3.87–5.11)
WBC: 2.6 10*3/uL — ABNORMAL LOW (ref 4.0–10.5)

## 2013-01-21 MED ORDER — OXYCODONE-ACETAMINOPHEN 5-325 MG PO TABS: 1.0000 | ORAL_TABLET | ORAL | Status: DC | PRN

## 2013-01-21 MED ORDER — SENNOSIDES-DOCUSATE SODIUM 8.6-50 MG PO TABS
2.0000 | ORAL_TABLET | Freq: Every day | ORAL | Status: DC
Start: 2013-01-21 — End: 2013-03-30

## 2013-01-21 MED ORDER — OXYCODONE-ACETAMINOPHEN 5-325 MG PO TABS
2.0000 | ORAL_TABLET | Freq: Once | ORAL | Status: AC
  Administered 2013-01-21: 2 via ORAL
  Filled 2013-01-21: qty 2

## 2013-01-21 MED ORDER — NAPROXEN 500 MG PO TABS: 500.0000 mg | ORAL_TABLET | Freq: Two times a day (BID) | ORAL | Status: DC

## 2013-01-21 MED ORDER — VALACYCLOVIR HCL 1 G PO TABS: 1000.0000 mg | ORAL_TABLET | Freq: Three times a day (TID) | ORAL | Status: DC

## 2013-01-21 NOTE — ED Notes (Signed)
Pt aware that we need to collect urine specimen.

## 2013-01-21 NOTE — ED Provider Notes (Signed)
History     CSN: [id number]  Arrival date & time 01/21/13  1136   First MD Initiated Contact with Patient 01/21/13 1243      Chief Complaint  Patient presents with  . Vaginal Pain    (Consider location/radiation/quality/duration/timing/severity/associated sxs/prior treatment) HPI Comments: 37 year old HIV positive female presents with a complaint of vaginal lesions, perineal lesions and anal lesions which has been going on for several days. It was gradual in onset, persistent, causes severe pain with urination and defecation and is associated with lesions on her perineum that are not bleeding. She has been taking Valtrex twice a day for the last 2 days but has not had any significant improvement in her symptoms.  Patient is a 37 y.o. female presenting with vaginal pain. The history is provided by the patient.  Vaginal Pain Pertinent negatives include no abdominal pain.    Past Medical History  Diagnosis Date  . HIV positive   . Hepatitis B     /E-chart  . Microcytic anemia     h/o per E-chart  . 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 Noncompliance with medication regimen     /e-chart  . 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 Pyelonephritis     h/o per E-chart  . Thyroid disease     Past Surgical History  Procedure Date  . Thyroid surgery   . Thyroid lobectomy 08/2002    left & isthmectomy; for benign thyroid adenoma/E-chart  . Tubal ligation 07/2002    /E-chart    No family history on file.  History  Substance Use Topics  . Smoking status: Never Smoker   . Smokeless tobacco: Never Used  . Alcohol Use: Yes    OB History    Grav Para Term Preterm Abortions TAB SAB Ect Mult Living                  Review of Systems  Constitutional: Negative for fever.  Gastrointestinal: Negative for abdominal pain.  Genitourinary: Positive for vaginal pain.  Skin: Positive for rash.    Allergies   Bactrim; Orange fruit; Peanut-containing drug products; and Shellfish allergy  Home Medications   Current Outpatient Rx  Name  Route  Sig  Dispense  Refill  . BENZTROPINE MESYLATE 1 MG PO TABS   Oral   Take 1 tablet (1 mg total) by mouth 2 (two) times daily.   60 tablet   0   . DARUNAVIR ETHANOLATE 400 MG PO TABS   Oral   Take 800 mg by mouth daily.         Marland Kitchen EMTRICITABINE-TENOFOVIR 200-300 MG PO TABS   Oral   Take 1 tablet by mouth daily.         Marland Kitchen OLANZAPINE 20 MG PO TBDP   Oral   Take 20 mg by mouth every other day.         Marland Kitchen RITONAVIR 100 MG PO CAPS   Oral   Take 100 mg by mouth daily.         Marland Kitchen VALACYCLOVIR HCL 500 MG PO TABS   Oral   Take 500 mg by mouth 2 (two) times daily.         Marland Kitchen NAPROXEN 500 MG PO TABS   Oral   Take 1 tablet (500 mg total) by mouth 2 (two) times daily with a meal.   30 tablet   0   .  OXYCODONE-ACETAMINOPHEN 5-325 MG PO TABS   Oral   Take 1 tablet by mouth every 4 (four) hours as needed for pain.   20 tablet   0   . VALACYCLOVIR HCL 1 G PO TABS   Oral   Take 1 tablet (1,000 mg total) by mouth 3 (three) times daily.   21 tablet   0     BP 104/79  Pulse 109  Temp 98 F (36.7 C) (Oral)  Resp 18  SpO2 100%  LMP 01/14/2013  Physical Exam  Nursing note and vitals reviewed. Constitutional: She appears well-developed and well-nourished. No distress.  HENT:  Head: Normocephalic and atraumatic.  Mouth/Throat: Oropharynx is clear and moist. No oropharyngeal exudate.  Eyes: Conjunctivae normal and EOM are normal. Pupils are equal, round, and reactive to light. Right eye exhibits no discharge. Left eye exhibits no discharge. No scleral icterus.  Neck: Normal range of motion. Neck supple. No JVD present. No thyromegaly present.  Cardiovascular: Normal rate, regular rhythm, normal heart sounds and intact distal pulses.  Exam reveals no gallop and no friction rub.   No murmur heard. Pulmonary/Chest: Effort normal and breath  sounds normal. No respiratory distress. She has no wheezes. She has no rales.  Abdominal: Soft. Bowel sounds are normal. She exhibits no distension and no mass. There is no tenderness.  Genitourinary:       Chaperone present for vaginal exam. There is not appear to be any lesions on the labia, she has multiple lesions surrounding her perineum between the vagina and anus and the perianal area. These are erythematous, tender, shallow ulcers.  Musculoskeletal: Normal range of motion. She exhibits no edema and no tenderness.  Lymphadenopathy:    She has no cervical adenopathy.  Neurological: She is alert. Coordination normal.  Skin: Skin is warm and dry. No rash noted. No erythema.  Psychiatric: She has a normal mood and affect. Her behavior is normal.    ED Course  Procedures (including critical care time)  Labs Reviewed  CBC WITH DIFFERENTIAL - Abnormal; Notable for the following:    WBC 2.6 (*)     Hemoglobin 9.3 (*)     HCT 28.9 (*)     MCV 66.1 (*)     MCH 21.3 (*)     RDW 18.0 (*)     Neutro Abs 1.4 (*)     All other components within normal limits   No results found.   1. Herpes genitalis       MDM  Herpetic lesions on her genitalia, she will require ongoing Valtrex, I will increase her dose 2 3 times a day, Percocet, stool softeners, followup with her doctor. Chair he has an appointment to see her infectious disease doctor this week. The patient is stable for discharge. I have encouraged her to followup for a repeat vaginal exam after her lesions have resolved to minimize the pain. She is not concerned for STDs at this time.        Vida Roller, MD 01/21/13 [phone]

## 2013-01-21 NOTE — ED Notes (Signed)
Pt called her infections disease MD on Tues (pt being tx for HIV) for pain r/t open sores b/w here anus and vagina.  Dr told her to take meds for herpes, but she has had no relief. Pt also c/o burning on urination and pain with defecation.

## 2013-01-25 ENCOUNTER — Ambulatory Visit: Payer: Self-pay

## 2013-01-25 ENCOUNTER — Telehealth: Payer: Self-pay | Admitting: *Deleted

## 2013-01-25 NOTE — Telephone Encounter (Signed)
Left message to call [facility] for RW and ADAP application and for new PAP smear appt.

## 2013-02-01 ENCOUNTER — Telehealth: Payer: Self-pay | Admitting: *Deleted

## 2013-02-01 NOTE — Telephone Encounter (Signed)
Called patient to try and schedule her a follow up appt was unable to get her on the phone will refer to [employer].

## 2013-02-22 ENCOUNTER — Other Ambulatory Visit: Payer: Self-pay

## 2013-02-22 ENCOUNTER — Ambulatory Visit: Payer: Self-pay

## 2013-03-30 ENCOUNTER — Emergency Department (HOSPITAL_COMMUNITY): Payer: Self-pay

## 2013-03-30 ENCOUNTER — Emergency Department (HOSPITAL_COMMUNITY)
Admission: EM | Admit: 2013-03-30 | Discharge: 2013-03-30 | Disposition: A | Discharge status: Home / Self Care | Payer: Self-pay | Attending: Emergency Medicine | Admitting: Emergency Medicine

## 2013-03-30 ENCOUNTER — Encounter (HOSPITAL_COMMUNITY): Payer: Self-pay | Admitting: Emergency Medicine

## 2013-03-30 DIAGNOSIS — Z8639 Personal history of other endocrine, nutritional and metabolic disease: Secondary | ICD-10-CM | POA: Insufficient documentation

## 2013-03-30 DIAGNOSIS — Z79899 Other long term (current) drug therapy: Secondary | ICD-10-CM | POA: Insufficient documentation

## 2013-03-30 DIAGNOSIS — B20 Human immunodeficiency virus [HIV] disease: Secondary | ICD-10-CM | POA: Insufficient documentation

## 2013-03-30 DIAGNOSIS — R0602 Shortness of breath: Secondary | ICD-10-CM | POA: Insufficient documentation

## 2013-03-30 DIAGNOSIS — Z87448 Personal history of other diseases of urinary system: Secondary | ICD-10-CM | POA: Insufficient documentation

## 2013-03-30 DIAGNOSIS — Z8619 Personal history of other infectious and parasitic diseases: Secondary | ICD-10-CM | POA: Insufficient documentation

## 2013-03-30 DIAGNOSIS — Z8701 Personal history of pneumonia (recurrent): Secondary | ICD-10-CM | POA: Insufficient documentation

## 2013-03-30 DIAGNOSIS — Z862 Personal history of diseases of the blood and blood-forming organs and certain disorders involving the immune mechanism: Secondary | ICD-10-CM | POA: Insufficient documentation

## 2013-03-30 DIAGNOSIS — K209 Esophagitis, unspecified without bleeding: Secondary | ICD-10-CM | POA: Insufficient documentation

## 2013-03-30 DIAGNOSIS — Z3202 Encounter for pregnancy test, result negative: Secondary | ICD-10-CM | POA: Insufficient documentation

## 2013-03-30 DIAGNOSIS — R1310 Dysphagia, unspecified: Secondary | ICD-10-CM | POA: Insufficient documentation

## 2013-03-30 DIAGNOSIS — J029 Acute pharyngitis, unspecified: Secondary | ICD-10-CM | POA: Insufficient documentation

## 2013-03-30 DIAGNOSIS — Z8659 Personal history of other mental and behavioral disorders: Secondary | ICD-10-CM | POA: Insufficient documentation

## 2013-03-30 LAB — CBC WITH DIFFERENTIAL/PLATELET
Eosinophils Relative: 4 % (ref 0–5)
HCT: 26.5 % — ABNORMAL LOW (ref 36.0–46.0)
Lymphs Abs: 0.6 10*3/uL — ABNORMAL LOW (ref 0.7–4.0)
MCV: 65 fL — ABNORMAL LOW (ref 78.0–100.0)
Monocytes Relative: 9 % (ref 3–12)
Platelets: 241 10*3/uL (ref 150–400)
RBC: 4.08 MIL/uL (ref 3.87–5.11)
WBC: 4.1 10*3/uL (ref 4.0–10.5)

## 2013-03-30 LAB — POCT PREGNANCY, URINE: Preg Test, Ur: NEGATIVE

## 2013-03-30 LAB — POCT I-STAT, CHEM 8
BUN: 7 mg/dL (ref 6–23)
Creatinine, Ser: 0.7 mg/dL (ref 0.50–1.10)
Potassium: 3.8 mEq/L (ref 3.5–5.1)
Sodium: 139 mEq/L (ref 135–145)

## 2013-03-30 LAB — POCT I-STAT TROPONIN I

## 2013-03-30 MED ORDER — FLUCONAZOLE 100 MG PO TABS
200.0000 mg | ORAL_TABLET | Freq: Once | ORAL | Status: AC
  Administered 2013-03-30: 200 mg via ORAL
  Filled 2013-03-30: qty 2

## 2013-03-30 MED ORDER — GI COCKTAIL ~~LOC~~
30.0000 mL | Freq: Once | ORAL | Status: AC
  Administered 2013-03-30: 30 mL via ORAL
  Filled 2013-03-30: qty 30

## 2013-03-30 MED ORDER — FLUCONAZOLE 100 MG PO TABS: 100.0000 mg | ORAL_TABLET | Freq: Every day | ORAL | Status: DC

## 2013-03-30 NOTE — ED Notes (Signed)
EKG signed by Dr. Nicanor Alcon and placed in pt chart.

## 2013-03-30 NOTE — ED Provider Notes (Signed)
History     CSN: [id number]  Arrival date & time 03/30/13  0443   First MD Initiated Contact with Patient 03/30/13 [phone]      Chief Complaint  Patient presents with  . Chest Pain    (Consider location/radiation/quality/duration/timing/severity/associated sxs/prior treatment) HPI Comments: Patient with history of HIV/AIDS, last CD4 count less than 10 in 07/2012 -- presents with complaint of left-sided chest pain and mid chest pain for the past 2 days. She states that this is much worse with eating and swallowing solids and liquids. It is worse with lying on her right side. She denies fever. She has had some mild shortness of breath at times. Patient denies history of blood clots, swelling in her legs, tenderness in her legs, recent surgeries, recent immobilizations, medications containing estrogen. No treatments prior to arrival. Onset acute. Course is intermittent. Nothing makes symptoms better. Pt states she has been compliant with medications despite past history of non-compliance.      Patient is a 37 y.o. female presenting with chest pain. The history is provided by the patient.  Chest Pain Associated symptoms: dysphagia   Associated symptoms: no abdominal pain, no cough, no fever, no headache, no nausea and not vomiting     Past Medical History  Diagnosis Date  . HIV positive   . Hepatitis B     /E-chart  . Microcytic anemia     h/o per E-chart  . 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 Noncompliance with medication regimen     /e-chart  . 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 Pyelonephritis     h/o per E-chart  . Thyroid disease     Past Surgical History  Procedure Laterality Date  . Thyroid surgery    . Thyroid lobectomy  08/2002    left & isthmectomy; for benign thyroid adenoma/E-chart  . Tubal ligation  07/2002    /E-chart    No family history on file.  History  Substance Use  Topics  . Smoking status: Never Smoker   . Smokeless tobacco: Never Used  . Alcohol Use: Yes    OB History   Grav Para Term Preterm Abortions TAB SAB Ect Mult Living                  Review of Systems  Constitutional: Negative for fever.  HENT: Positive for sore throat and trouble swallowing. Negative for rhinorrhea.   Eyes: Negative for redness.  Respiratory: Negative for cough.   Cardiovascular: Positive for chest pain.  Gastrointestinal: Negative for nausea, vomiting, abdominal pain and diarrhea.  Genitourinary: Negative for dysuria.  Musculoskeletal: Negative for myalgias.  Skin: Negative for rash.  Neurological: Negative for headaches.    Allergies  Bactrim; Orange fruit; Peanut-containing drug products; and Shellfish allergy  Home Medications   Current Outpatient Rx  Name  Route  Sig  Dispense  Refill  . dapsone 100 MG tablet   Oral   Take 100 mg by mouth daily.         . darunavir (PREZISTA) 400 MG tablet   Oral   Take 800 mg by mouth daily.         Marland Kitchen emtricitabine-tenofovir (TRUVADA) 200-300 MG per tablet   Oral   Take 1 tablet by mouth daily.         Marland Kitchen loratadine (CLARITIN) 10 MG tablet   Oral   Take 10 mg by mouth daily.         Marland Kitchen  OLANZapine zydis (ZYPREXA) 20 MG disintegrating tablet   Oral   Take 20 mg by mouth every other day.         . ritonavir (NORVIR) 100 MG capsule   Oral   Take 100 mg by mouth daily.           BP 119/80  Pulse 83  Temp(Src) 98.6 F (37 C)  Resp 12  SpO2 100%  LMP 03/14/2013  Physical Exam  Nursing note and vitals reviewed. Constitutional: She appears well-developed and well-nourished.  HENT:  Head: Normocephalic and atraumatic.  Right Ear: Tympanic membrane, external ear and ear canal normal.  Left Ear: Tympanic membrane, external ear and ear canal normal.  Nose: Nose normal.  Mouth/Throat: Uvula is midline, oropharynx is clear and moist and mucous membranes are normal. Mucous membranes are not  dry. No oropharyngeal exudate, posterior oropharyngeal edema, posterior oropharyngeal erythema or tonsillar abscesses.  Eyes: Conjunctivae are normal. Right eye exhibits no discharge. Left eye exhibits no discharge.  Neck: Normal range of motion. Neck supple.  Cardiovascular: Normal rate, regular rhythm and normal heart sounds.   Pulmonary/Chest: Effort normal and breath sounds normal.  Abdominal: Soft. There is no tenderness.  Neurological: She is alert.  Skin: Skin is warm and dry.  Psychiatric: She has a normal mood and affect.    ED Course  Procedures (including critical care time)  Labs Reviewed  CBC WITH DIFFERENTIAL - Abnormal; Notable for the following:    Hemoglobin 8.7 (*)    HCT 26.5 (*)    MCV 65.0 (*)    MCH 21.3 (*)    RDW 17.6 (*)    Lymphs Abs 0.6 (*)    All other components within normal limits  POCT I-STAT, CHEM 8 - Abnormal; Notable for the following:    Hemoglobin 9.5 (*)    HCT 28.0 (*)    All other components within normal limits  POCT I-STAT TROPONIN I  POCT PREGNANCY, URINE   Dg Chest 2 View  03/30/2013  *RADIOLOGY REPORT*  Clinical Data: Left-sided chest pain.  CHEST - 2 VIEW  Comparison: Chest radiograph performed 03/10/2012  Findings: The lungs are well-aerated and clear.  There is no evidence of focal opacification, pleural effusion or pneumothorax.  The heart is normal in size; the mediastinal contour is within normal limits.  No acute osseous abnormalities are seen.  IMPRESSION: No acute cardiopulmonary process seen.   Original Report Authenticated By: Tonia Ghent, M.D.      1. Esophagitis     6:44 AM Patient seen and examined. Work-up initiated. Medications ordered. Patient d/w Dr. Nicanor Alcon.   Vital signs reviewed and are as follows: Filed Vitals:   03/30/13 0456  BP: 119/80  Pulse: 83  Temp: 98.6 F (37 C)  Resp: 12    Date: 03/30/2013  Rate: 80  Rhythm: normal sinus rhythm  QRS Axis: normal  Intervals: normal  ST/T Wave  abnormalities: normal  Conduction Disutrbances:none  Narrative Interpretation:   Old EKG Reviewed: none available  8:18 AM results reviewed with patient. She is eating and drinking in room.  Discussed need for patient to followup with Dr. Orvan Falconer to ensure resolution of symptoms and for management of likely esophagitis. Patient verbalizes understanding and agrees. She has upcoming appointment.  Patient urged to return immediately with fever, inability to swallow, trouble breathing, or she has any other concerns.   MDM  Patient with sore throat and pain with swallowing. I suspected this represents esophagitis given history of immunocompromise  due to AIDS. She appears well, nontoxic. She is afebrile. No difficulty breathing. Patient has full range of motion in neck. Do not suspect retropharyngeal abscess. Chest x-ray is normal. Vital signs are normal. She is appropriate for discharge to home and can followup with her infectious disease physician.   Anemia at baseline.        Renne Crigler, PA-C 03/30/13 [phone]

## 2013-03-30 NOTE — ED Notes (Signed)
Pt presented to ED with chest pain in the left side.Pt gives the history that chest pain since 2 days with on and off only when moving.Pain is nonradiating.

## 2013-03-31 NOTE — ED Provider Notes (Signed)
Medical screening examination/treatment/procedure(s) were performed by non-physician practitioner and as supervising physician I was immediately available for consultation/collaboration.  Jasmine Awe, MD 03/31/13 [phone]

## 2013-05-03 ENCOUNTER — Other Ambulatory Visit: Payer: Self-pay | Admitting: Internal Medicine

## 2013-06-02 ENCOUNTER — Encounter: Payer: Self-pay | Admitting: *Deleted

## 2013-06-15 ENCOUNTER — Telehealth: Payer: Self-pay | Admitting: *Deleted

## 2013-06-15 NOTE — Telephone Encounter (Signed)
Left message with information about walk-in MD appointments.  Given times that these are available.  Encouraged pt to return to the clinic for care.

## 2013-06-18 ENCOUNTER — Emergency Department (HOSPITAL_COMMUNITY)
Admission: EM | Admit: 2013-06-18 | Discharge: 2013-06-18 | Disposition: A | Discharge status: Home / Self Care | Payer: Medicaid Other | Attending: Emergency Medicine | Admitting: Emergency Medicine

## 2013-06-18 ENCOUNTER — Encounter (HOSPITAL_COMMUNITY): Payer: Self-pay | Admitting: Emergency Medicine

## 2013-06-18 ENCOUNTER — Emergency Department (HOSPITAL_COMMUNITY): Payer: Medicaid Other

## 2013-06-18 DIAGNOSIS — R1310 Dysphagia, unspecified: Secondary | ICD-10-CM | POA: Insufficient documentation

## 2013-06-18 DIAGNOSIS — Z8659 Personal history of other mental and behavioral disorders: Secondary | ICD-10-CM | POA: Insufficient documentation

## 2013-06-18 DIAGNOSIS — Z8619 Personal history of other infectious and parasitic diseases: Secondary | ICD-10-CM | POA: Insufficient documentation

## 2013-06-18 DIAGNOSIS — Z862 Personal history of diseases of the blood and blood-forming organs and certain disorders involving the immune mechanism: Secondary | ICD-10-CM | POA: Insufficient documentation

## 2013-06-18 DIAGNOSIS — K209 Esophagitis, unspecified without bleeding: Secondary | ICD-10-CM | POA: Insufficient documentation

## 2013-06-18 DIAGNOSIS — Z79899 Other long term (current) drug therapy: Secondary | ICD-10-CM | POA: Insufficient documentation

## 2013-06-18 DIAGNOSIS — Z21 Asymptomatic human immunodeficiency virus [HIV] infection status: Secondary | ICD-10-CM | POA: Insufficient documentation

## 2013-06-18 DIAGNOSIS — D509 Iron deficiency anemia, unspecified: Secondary | ICD-10-CM | POA: Insufficient documentation

## 2013-06-18 DIAGNOSIS — Z8639 Personal history of other endocrine, nutritional and metabolic disease: Secondary | ICD-10-CM | POA: Insufficient documentation

## 2013-06-18 DIAGNOSIS — Z8701 Personal history of pneumonia (recurrent): Secondary | ICD-10-CM | POA: Insufficient documentation

## 2013-06-18 LAB — CBC WITH DIFFERENTIAL/PLATELET
Basophils Relative: 0 % (ref 0–1)
Eosinophils Absolute: 0 10*3/uL (ref 0.0–0.7)
Eosinophils Relative: 1 % (ref 0–5)
Hemoglobin: 7 g/dL — ABNORMAL LOW (ref 12.0–15.0)
Lymphocytes Relative: 10 % — ABNORMAL LOW (ref 12–46)
MCHC: 31.1 g/dL (ref 30.0–36.0)
Monocytes Relative: 8 % (ref 3–12)
Neutrophils Relative %: 81 % — ABNORMAL HIGH (ref 43–77)
RBC: 3.57 MIL/uL — ABNORMAL LOW (ref 3.87–5.11)
WBC: 4.6 10*3/uL (ref 4.0–10.5)

## 2013-06-18 LAB — BASIC METABOLIC PANEL
Chloride: 98 mEq/L (ref 96–112)
GFR calc non Af Amer: >90 mL/min (ref >90)
Glucose, Bld: 97 mg/dL (ref 70–99)
Potassium: 3.3 mEq/L — ABNORMAL LOW (ref 3.5–5.1)
Sodium: 134 mEq/L — ABNORMAL LOW (ref 135–145)

## 2013-06-18 MED ORDER — FAMOTIDINE 40 MG PO TABS: 40.0000 mg | ORAL_TABLET | Freq: Every day | ORAL | Status: DC

## 2013-06-18 NOTE — ED Notes (Signed)
Patient states "I have lost over 30 pounds in the past three months without trying". Also states that this chest pain described as burning has been going on for months. Unable to eat due to the discomfort.

## 2013-06-18 NOTE — ED Notes (Signed)
Pt. Stated, I've been having chest pain and not able to eat in 3 months.

## 2013-06-18 NOTE — ED Provider Notes (Signed)
History    CSN: [id number] Arrival date & time 06/18/13  1426  First MD Initiated Contact with Patient 06/18/13 1443     Chief Complaint  Patient presents with  . Chest Pain   (Consider location/radiation/quality/duration/timing/severity/associated sxs/prior Treatment) The history is provided by the patient.   37 year old female. With several month complaint of discomfort with swallowing burning in the esophagus. And chest pain. Seen previously treated with a several day course of Diflucan without any improvement. Was referred to infectious disease at that time. Patient is HIV positive is followed by the infectious disease unit here at [facility]. Patient did not followup.  Patient states his symptoms of the same as when she was seen in April chart was reviewed. Chest pain is diffuse by history somewhat noncardiac in nature. No significant shortness of breath. Past Medical History  Diagnosis Date  . HIV positive   . Hepatitis B     /E-chart  . Microcytic anemia     h/o per E-chart  . 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 Noncompliance with medication regimen     /e-chart  . 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 Pyelonephritis     h/o per E-chart  . Thyroid disease    Past Surgical History  Procedure Laterality Date  . Thyroid surgery    . Thyroid lobectomy  08/2002    left & isthmectomy; for benign thyroid adenoma/E-chart  . Tubal ligation  07/2002    /E-chart   History reviewed. No pertinent family history. History  Substance Use Topics  . Smoking status: Never Smoker   . Smokeless tobacco: Never Used  . Alcohol Use: Yes   OB History   Grav Para Term Preterm Abortions TAB SAB Ect Mult Living                 Review of Systems  Constitutional: Negative for fever.  HENT: Positive for trouble swallowing. Negative for facial swelling, mouth sores, voice change and sinus pressure.   Eyes: Negative  for redness.  Respiratory: Negative for shortness of breath.   Cardiovascular: Positive for chest pain. Negative for leg swelling.  Gastrointestinal: Negative for nausea, vomiting and abdominal pain.  Musculoskeletal: Negative for myalgias and back pain.  Neurological: Negative for headaches.  Hematological: Does not bruise/bleed easily.  Psychiatric/Behavioral: Negative for confusion.    Allergies  Bactrim; Dapsone; Orange fruit; Peanut-containing drug products; and Shellfish allergy  Home Medications   Current Outpatient Rx  Name  Route  Sig  Dispense  Refill  . acetaminophen (TYLENOL) 500 MG tablet   Oral   Take 1,000 mg by mouth every 6 (six) hours as needed for pain.         Marland Kitchen albuterol (PROVENTIL HFA;VENTOLIN HFA) 108 (90 BASE) MCG/ACT inhaler   Inhalation   Inhale 2 puffs into the lungs every 6 (six) hours as needed for wheezing.         . benztropine (COGENTIN) 2 MG tablet   Oral   Take 2 mg by mouth daily.         . darunavir (PREZISTA) 400 MG tablet   Oral   Take 800 mg by mouth daily.         Marland Kitchen emtricitabine-tenofovir (TRUVADA) 200-300 MG per tablet   Oral   Take 1 tablet by mouth daily.         Marland Kitchen HYDROcodone-acetaminophen (NORCO/VICODIN) 5-325 MG per tablet  Oral   Take 1 tablet by mouth every 4 (four) hours as needed for pain.         . iron polysaccharides (NIFEREX) 150 MG capsule   Oral   Take 150 mg by mouth 2 (two) times daily.         . Multiple Vitamins-Minerals (WOMENS MULTI VITAMIN & MINERAL PO)   Oral   Take 2 each by mouth daily. Gummy vitamins         . OLANZapine zydis (ZYPREXA) 20 MG disintegrating tablet   Oral   Take 20 mg by mouth daily.         . ritonavir (NORVIR) 100 MG capsule   Oral   Take 100 mg by mouth daily.          BP 111/68  Pulse 75  Temp(Src) 100.3 F (37.9 C) (Oral)  Resp 15  SpO2 96%  LMP 06/04/2013 Physical Exam  Nursing note and vitals reviewed. Constitutional: She is oriented to  person, place, and time. She appears well-developed and well-nourished. No distress.  HENT:  Head: Normocephalic and atraumatic.  Mouth/Throat: Oropharynx is clear and moist. No oropharyngeal exudate.  Eyes: Conjunctivae are normal.  Neck: Normal range of motion.  Cardiovascular: Normal rate, regular rhythm and normal heart sounds.   No murmur heard. Pulmonary/Chest: Effort normal and breath sounds normal. No respiratory distress.  Abdominal: Soft. Bowel sounds are normal. There is no tenderness.  Musculoskeletal: Normal range of motion.  Neurological: She is alert and oriented to person, place, and time. No cranial nerve deficit. She exhibits normal muscle tone. Coordination normal.  Skin: Skin is warm. No rash noted.    ED Course  Procedures (including critical care time) Labs Reviewed  CBC WITH DIFFERENTIAL - Abnormal; Notable for the following:    RBC 3.57 (*)    Hemoglobin 7.0 (*)    HCT 22.5 (*)    MCV 63.0 (*)    MCH 19.6 (*)    RDW 17.4 (*)    Neutrophils Relative % 81 (*)    Lymphocytes Relative 10 (*)    Lymphs Abs 0.5 (*)    All other components within normal limits  BASIC METABOLIC PANEL - Abnormal; Notable for the following:    Sodium 134 (*)    Potassium 3.3 (*)    All other components within normal limits  TROPONIN I    Date: 06/18/2013  Rate: 111  Rhythm: sinus tachycardia  QRS Axis: normal  Intervals: normal  ST/T Wave abnormalities: nonspecific ST/T changes  Conduction Disutrbances:none  Narrative Interpretation:   Old EKG Reviewed: changes noted ST segment changes inferiorly. Not consistent with a STEMI. Somewhat wandering baseline. These changes are new compared to EKG from 03/30/2013    Dg Chest 2 View  06/18/2013   *RADIOLOGY REPORT*  Clinical Data: Chest pain and shortness of breath  CHEST - 2 VIEW  Comparison: 03/30/2013  Findings: The heart size and mediastinal contours are within normal limits.  Both lungs are clear.  The visualized skeletal  structures are unremarkable.  IMPRESSION: Negative exam.   Original Report Authenticated By: Signa Kell, M.D.   Results for orders placed during the hospital encounter of 06/18/13  CBC WITH DIFFERENTIAL      Result Value Range   WBC 4.6  4.0 - 10.5 K/uL   RBC 3.57 (*) 3.87 - 5.11 MIL/uL   Hemoglobin 7.0 (*) 12.0 - 15.0 g/dL   HCT 14.7 (*) 82.9 - 56.2 %   MCV 63.0 (*)  78.0 - 100.0 fL   MCH 19.6 (*) 26.0 - 34.0 pg   MCHC 31.1  30.0 - 36.0 g/dL   RDW 62.1 (*) 30.8 - 65.7 %   Platelets 253  150 - 400 K/uL   Neutrophils Relative % 81 (*) 43 - 77 %   Lymphocytes Relative 10 (*) 12 - 46 %   Monocytes Relative 8  3 - 12 %   Eosinophils Relative 1  0 - 5 %   Basophils Relative 0  0 - 1 %   Neutro Abs 3.7  1.7 - 7.7 K/uL   Lymphs Abs 0.5 (*) 0.7 - 4.0 K/uL   Monocytes Absolute 0.4  0.1 - 1.0 K/uL   Eosinophils Absolute 0.0  0.0 - 0.7 K/uL   Basophils Absolute 0.0  0.0 - 0.1 K/uL   RBC Morphology TEARDROP CELLS    BASIC METABOLIC PANEL      Result Value Range   Sodium 134 (*) 135 - 145 mEq/L   Potassium 3.3 (*) 3.5 - 5.1 mEq/L   Chloride 98  96 - 112 mEq/L   CO2 24  19 - 32 mEq/L   Glucose, Bld 97  70 - 99 mg/dL   BUN 6  6 - 23 mg/dL   Creatinine, Ser 8.46  0.50 - 1.10 mg/dL   Calcium 9.0  8.4 - 96.2 mg/dL   GFR calc non Af Amer >90  >90 mL/min   GFR calc Af Amer >90  >90 mL/min  TROPONIN I      Result Value Range   Troponin I <0.30  <0.30 ng/mL     1. Esophagitis     MDM  Patient seen for this before back in April. Has not followed up with infectious disease. Concern would be some sort of chronic esophagitis of the esophagus it is gone untreated. Past attempts to treat with Diflucan without any improvement. We'll give a trial of Pepcid. The patient most likely needs referral for upper endoscopy.  Workup otherwise negative no significant abnormalities in the white blood cell count or the differential. Electrolytes are normal. Troponin was negative EKG had no acute findings.  No evidence of this being cardiac in nature. No evidence of pneumonia pneumothorax free air in the mediastinum.  Low-grade fever noted 100.3 patient's tachycardia improved in the emergency department. Patient given precautions to return for any new or worse symptoms. Close followup with infectious disease will be important. Patient's in no acute distress nontoxic.    Shelda Jakes, MD 06/18/13 [phone]

## 2013-07-04 ENCOUNTER — Encounter (HOSPITAL_COMMUNITY): Payer: Self-pay | Admitting: Emergency Medicine

## 2013-07-04 ENCOUNTER — Emergency Department (HOSPITAL_COMMUNITY): Payer: Medicaid Other

## 2013-07-04 ENCOUNTER — Emergency Department (HOSPITAL_COMMUNITY)
Admission: EM | Admit: 2013-07-04 | Discharge: 2013-07-04 | Disposition: A | Discharge status: Home / Self Care | Payer: Medicaid Other | Attending: Emergency Medicine | Admitting: Emergency Medicine

## 2013-07-04 DIAGNOSIS — R079 Chest pain, unspecified: Secondary | ICD-10-CM

## 2013-07-04 DIAGNOSIS — Z9119 Patient's noncompliance with other medical treatment and regimen: Secondary | ICD-10-CM | POA: Insufficient documentation

## 2013-07-04 DIAGNOSIS — Z87448 Personal history of other diseases of urinary system: Secondary | ICD-10-CM | POA: Insufficient documentation

## 2013-07-04 DIAGNOSIS — Z8639 Personal history of other endocrine, nutritional and metabolic disease: Secondary | ICD-10-CM | POA: Insufficient documentation

## 2013-07-04 DIAGNOSIS — R109 Unspecified abdominal pain: Secondary | ICD-10-CM | POA: Insufficient documentation

## 2013-07-04 DIAGNOSIS — Z91199 Patient's noncompliance with other medical treatment and regimen due to unspecified reason: Secondary | ICD-10-CM | POA: Insufficient documentation

## 2013-07-04 DIAGNOSIS — F29 Unspecified psychosis not due to a substance or known physiological condition: Secondary | ICD-10-CM | POA: Insufficient documentation

## 2013-07-04 DIAGNOSIS — R112 Nausea with vomiting, unspecified: Secondary | ICD-10-CM | POA: Insufficient documentation

## 2013-07-04 DIAGNOSIS — Z9851 Tubal ligation status: Secondary | ICD-10-CM | POA: Insufficient documentation

## 2013-07-04 DIAGNOSIS — Z21 Asymptomatic human immunodeficiency virus [HIV] infection status: Secondary | ICD-10-CM | POA: Insufficient documentation

## 2013-07-04 DIAGNOSIS — Z8701 Personal history of pneumonia (recurrent): Secondary | ICD-10-CM | POA: Insufficient documentation

## 2013-07-04 DIAGNOSIS — R0602 Shortness of breath: Secondary | ICD-10-CM | POA: Insufficient documentation

## 2013-07-04 DIAGNOSIS — Z79899 Other long term (current) drug therapy: Secondary | ICD-10-CM | POA: Insufficient documentation

## 2013-07-04 DIAGNOSIS — R000 Tachycardia, unspecified: Secondary | ICD-10-CM | POA: Insufficient documentation

## 2013-07-04 DIAGNOSIS — Z8709 Personal history of other diseases of the respiratory system: Secondary | ICD-10-CM | POA: Insufficient documentation

## 2013-07-04 DIAGNOSIS — Z3202 Encounter for pregnancy test, result negative: Secondary | ICD-10-CM | POA: Insufficient documentation

## 2013-07-04 DIAGNOSIS — Z862 Personal history of diseases of the blood and blood-forming organs and certain disorders involving the immune mechanism: Secondary | ICD-10-CM | POA: Insufficient documentation

## 2013-07-04 DIAGNOSIS — K828 Other specified diseases of gallbladder: Secondary | ICD-10-CM

## 2013-07-04 DIAGNOSIS — K829 Disease of gallbladder, unspecified: Secondary | ICD-10-CM | POA: Insufficient documentation

## 2013-07-04 LAB — COMPREHENSIVE METABOLIC PANEL
ALT: 37 U/L — ABNORMAL HIGH (ref 0–35)
BUN: 11 mg/dL (ref 6–23)
CO2: 23 mEq/L (ref 19–32)
Calcium: 9.8 mg/dL (ref 8.4–10.5)
GFR calc Af Amer: >90 mL/min (ref >90)
GFR calc non Af Amer: >90 mL/min (ref >90)
Glucose, Bld: 105 mg/dL — ABNORMAL HIGH (ref 70–99)
Total Protein: 8.4 g/dL — ABNORMAL HIGH (ref 6.0–8.3)

## 2013-07-04 LAB — CBC WITH DIFFERENTIAL/PLATELET
Basophils Relative: 0 % (ref 0–1)
Eosinophils Absolute: 0 10*3/uL (ref 0.0–0.7)
Eosinophils Relative: 1 % (ref 0–5)
HCT: 26 % — ABNORMAL LOW (ref 36.0–46.0)
Hemoglobin: 8.1 g/dL — ABNORMAL LOW (ref 12.0–15.0)
Lymphs Abs: 0.5 10*3/uL — ABNORMAL LOW (ref 0.7–4.0)
MCH: 19.4 pg — ABNORMAL LOW (ref 26.0–34.0)
MCHC: 31.2 g/dL (ref 30.0–36.0)
MCV: 62.4 fL — ABNORMAL LOW (ref 78.0–100.0)
Monocytes Absolute: 0.2 10*3/uL (ref 0.1–1.0)
Neutro Abs: 3.1 10*3/uL (ref 1.7–7.7)
RBC: 4.17 MIL/uL (ref 3.87–5.11)

## 2013-07-04 LAB — LIPASE, BLOOD: Lipase: 80 U/L — ABNORMAL HIGH (ref 11–59)

## 2013-07-04 LAB — URINALYSIS, ROUTINE W REFLEX MICROSCOPIC
Glucose, UA: NEGATIVE mg/dL
Ketones, ur: 40 mg/dL — AB
Leukocytes, UA: NEGATIVE
Nitrite: NEGATIVE
Protein, ur: 30 mg/dL — AB
pH: 6 (ref 5.0–8.0)

## 2013-07-04 LAB — URINE MICROSCOPIC-ADD ON

## 2013-07-04 LAB — TROPONIN I: Troponin I: <0.3 ng/mL (ref <0.30)

## 2013-07-04 MED ORDER — ONDANSETRON HCL 4 MG PO TABS: 4.0000 mg | ORAL_TABLET | Freq: Three times a day (TID) | ORAL | Status: DC | PRN

## 2013-07-04 MED ORDER — NYSTATIN 100000 UNIT/ML MT SUSP: 400000.0000 [IU] | Freq: Four times a day (QID) | OROMUCOSAL | Status: DC

## 2013-07-04 MED ORDER — HYDROMORPHONE HCL PF 1 MG/ML IJ SOLN
1.0000 mg | Freq: Once | INTRAMUSCULAR | Status: AC
  Administered 2013-07-04: 1 mg via INTRAVENOUS
  Filled 2013-07-04: qty 1

## 2013-07-04 MED ORDER — ONDANSETRON HCL 4 MG/2ML IJ SOLN
4.0000 mg | Freq: Once | INTRAMUSCULAR | Status: AC
  Administered 2013-07-04: 4 mg via INTRAVENOUS
  Filled 2013-07-04: qty 2

## 2013-07-04 MED ORDER — SODIUM CHLORIDE 0.9 % IV SOLN
INTRAVENOUS | Status: DC
  Administered 2013-07-04: 150 mL/h via INTRAVENOUS

## 2013-07-04 MED ORDER — ONDANSETRON HCL 4 MG PO TABS: 4.0000 mg | ORAL_TABLET | Freq: Four times a day (QID) | ORAL | Status: DC

## 2013-07-04 MED ORDER — IOHEXOL 350 MG/ML SOLN
100.0000 mL | Freq: Once | INTRAVENOUS | Status: AC | PRN
  Administered 2013-07-04: 100 mL via INTRAVENOUS

## 2013-07-04 MED ORDER — OXYCODONE-ACETAMINOPHEN 5-325 MG PO TABS: 1.0000 | ORAL_TABLET | ORAL | Status: DC | PRN

## 2013-07-04 MED ORDER — HYDROCODONE-ACETAMINOPHEN 7.5-325 MG/15ML PO SOLN: 15.0000 mL | Freq: Four times a day (QID) | ORAL | Status: DC | PRN

## 2013-07-04 MED ORDER — GI COCKTAIL ~~LOC~~
30.0000 mL | Freq: Once | ORAL | Status: AC
  Administered 2013-07-04: 30 mL via ORAL
  Filled 2013-07-04: qty 30

## 2013-07-04 NOTE — ED Provider Notes (Signed)
History    CSN: [id number] Arrival date & time 07/04/13  1239  First MD Initiated Contact with Patient 07/04/13 1248     Chief Complaint  Patient presents with  . Chest Pain  . Shortness of Breath   (Consider location/radiation/quality/duration/timing/severity/associated sxs/prior Treatment) HPI Comments: Patient presents to ER for evaluation of chest pain. Patient reports that she is intermittent episodes of chest pain since April. She has been seen in the ER twice for this. Patient reports pain in the center of her chest which is sharp in nature. It is accompanied by nausea and vomiting. Patient reports that it worsens when she eats and has not been able to eat because of the symptoms. She has been told that she has esophagitis but her medications are not helping (pepcid, dexilant). Pain is moderate to severe. It is worsened by eating.  Patient is a 37 y.o. female presenting with chest pain and shortness of breath.  Chest Pain Associated symptoms: abdominal pain, nausea, shortness of breath and vomiting   Shortness of Breath Associated symptoms: abdominal pain, chest pain and vomiting    Past Medical History  Diagnosis Date  . HIV positive   . Hepatitis B     /E-chart  . Microcytic anemia     h/o per E-chart  . 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 Noncompliance with medication regimen     /e-chart  . 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 Pyelonephritis     h/o per E-chart  . Thyroid disease    Past Surgical History  Procedure Laterality Date  . Thyroid surgery    . Thyroid lobectomy  08/2002    left & isthmectomy; for benign thyroid adenoma/E-chart  . Tubal ligation  07/2002    /E-chart   No family history on file. History  Substance Use Topics  . Smoking status: Never Smoker   . Smokeless tobacco: Never Used  . Alcohol Use: Yes   OB History   Grav Para Term Preterm Abortions TAB SAB  Ect Mult Living                 Review of Systems  Respiratory: Positive for shortness of breath.   Cardiovascular: Positive for chest pain.  Gastrointestinal: Positive for nausea, vomiting and abdominal pain.  All other systems reviewed and are negative.    Allergies  Bactrim; Dapsone; Orange fruit; Peanut-containing drug products; and Shellfish allergy  Home Medications   Current Outpatient Rx  Name  Route  Sig  Dispense  Refill  . acetaminophen (TYLENOL) 500 MG tablet   Oral   Take 1,000 mg by mouth every 6 (six) hours as needed for pain.         Marland Kitchen albuterol (PROVENTIL HFA;VENTOLIN HFA) 108 (90 BASE) MCG/ACT inhaler   Inhalation   Inhale 2 puffs into the lungs every 6 (six) hours as needed for wheezing.         . benztropine (COGENTIN) 2 MG tablet   Oral   Take 2 mg by mouth daily.         . darunavir (PREZISTA) 400 MG tablet   Oral   Take 800 mg by mouth daily.         Marland Kitchen dexlansoprazole (DEXILANT) 60 MG capsule   Oral   Take 60 mg by mouth daily.         Marland Kitchen emtricitabine-tenofovir (TRUVADA) 200-300 MG per tablet  Oral   Take 1 tablet by mouth daily.         Marland Kitchen HYDROcodone-homatropine (HYCODAN) 5-1.5 MG/5ML syrup   Oral   Take 5 mLs by mouth every 6 (six) hours as needed for cough or pain.         . iron polysaccharides (NIFEREX) 150 MG capsule   Oral   Take 150 mg by mouth 2 (two) times daily.         . Multiple Vitamins-Minerals (WOMENS MULTI VITAMIN & MINERAL PO)   Oral   Take 2 each by mouth daily. Gummy vitamins         . ritonavir (NORVIR) 100 MG capsule   Oral   Take 100 mg by mouth daily.         Marland Kitchen OLANZapine zydis (ZYPREXA) 20 MG disintegrating tablet   Oral   Take 20 mg by mouth daily.          BP 105/79  Pulse 105  Temp(Src) 98.9 F (37.2 C) (Oral)  Resp 19  SpO2 100%  LMP 06/24/2013 Physical Exam  Constitutional: She is oriented to person, place, and time. She appears well-developed and well-nourished. No  distress.  HENT:  Head: Normocephalic and atraumatic.  Right Ear: Hearing normal.  Left Ear: Hearing normal.  Nose: Nose normal.  Mouth/Throat: Oropharynx is clear and moist and mucous membranes are normal.  Eyes: Conjunctivae and EOM are normal. Pupils are equal, round, and reactive to light.  Neck: Normal range of motion. Neck supple.  Cardiovascular: Regular rhythm, S1 normal and S2 normal.  Tachycardia present.  Exam reveals no gallop and no friction rub.   No murmur heard. Pulmonary/Chest: Effort normal and breath sounds normal. No respiratory distress. She exhibits no tenderness.  Abdominal: Soft. Normal appearance and bowel sounds are normal. There is no hepatosplenomegaly. There is tenderness in the right upper quadrant and epigastric area. There is no rebound, no guarding, no tenderness at McBurney's point and negative Murphy's sign. No hernia.  Musculoskeletal: Normal range of motion.  Neurological: She is alert and oriented to person, place, and time. She has normal strength. No cranial nerve deficit or sensory deficit. Coordination normal. GCS eye subscore is 4. GCS verbal subscore is 5. GCS motor subscore is 6.  Skin: Skin is warm, dry and intact. No rash noted. No cyanosis.  Psychiatric: She has a normal mood and affect. Her speech is normal and behavior is normal. Thought content normal.    ED Course  Procedures (including critical care time)   Date: 07/04/2013  Rate: 108  Rhythm: sinus tachycardia  QRS Axis: normal  Intervals: normal  ST/T Wave abnormalities: nonspecific ST/T changes  Conduction Disutrbances:none  Narrative Interpretation:   Old EKG Reviewed: unchanged   Labs Reviewed  CBC WITH DIFFERENTIAL - Abnormal; Notable for the following:    WBC 3.8 (*)    Hemoglobin 8.1 (*)    HCT 26.0 (*)    MCV 62.4 (*)    MCH 19.4 (*)    RDW 18.5 (*)    Neutrophils Relative % 80 (*)    Lymphs Abs 0.5 (*)    All other components within normal limits  COMPREHENSIVE  METABOLIC PANEL - Abnormal; Notable for the following:    Potassium 3.3 (*)    Glucose, Bld 105 (*)    Total Protein 8.4 (*)    AST 56 (*)    ALT 37 (*)    Alkaline Phosphatase 178 (*)    All other components within  normal limits  LIPASE, BLOOD - Abnormal; Notable for the following:    Lipase 80 (*)    All other components within normal limits  D-DIMER, QUANTITATIVE - Abnormal; Notable for the following:    D-Dimer, Quant 2.01 (*)    All other components within normal limits  TROPONIN I  URINALYSIS, ROUTINE W REFLEX MICROSCOPIC   US Abdomen Complete  07/04/2013   *RADIOLOGY REPORT*  Clinical Data:  Upper abdominal pain and vomiting  ABDOMINAL ULTRASOUND COMPLETE  Comparison:  06/11/2010  Findings:  Gallbladder:  No gallbladder wall thickening or pericholecystic fluid.  Gallbladder sludge but no stones noted.  Negative sonographic Murphy's sign.  Common Bile Duct:  Within normal limits in caliber.  Liver: No focal mass lesion identified.  Within normal limits in parenchymal echogenicity.  IVC:  Appears normal.  Pancreas:  No abnormality identified.  Spleen:  Normal in size.  Contains multiple granulomas.  Right kidney:  Normal in size and parenchymal echogenicity.  No evidence of mass or hydronephrosis.  Left kidney:  Normal in size and parenchymal echogenicity.  No evidence of mass or hydronephrosis.  Abdominal Aorta:  No aneurysm identified.  IMPRESSION:  1.  Gallbladder sludge. 2.  Splenic granulomas.   Original Report Authenticated By: Signa Kell, M.D.   Chest: Chest pain  MDM  Presents to the ER for evaluation of chest pain. Pain is actually central chest but also into the upper abdomen. She reports that she has had symptoms similar to this in the past and was treated for acid reflux and esophagitis. This does not appear to be cardiac in nature. Her cardiac workup has been negative. She did have tenderness in the right upper quadrant and LFTs were slightly elevated. Gallbladder  ultrasound, however, was normal.  Because patient has had chest pain, some shortness of breath and her d-dimer was elevated. Patient will undergo CT angiography to rule out PE. Case signed out to Dr. Juleen China to follow up on CT, disposition pending.   Gilda Crease, MD 07/04/13 1640

## 2013-07-04 NOTE — ED Notes (Signed)
Ultrasound at bedside

## 2013-07-04 NOTE — ED Notes (Signed)
Pt states that she has been having central chest pain since April. Over the past week the pain has gotten worse. Pt states that she had esophagitis and took meds that helped relieve the pain but not since the past week.

## 2013-07-04 NOTE — ED Notes (Signed)
IV team at bedside 

## 2013-07-04 NOTE — ED Notes (Signed)
Awaiting modified scripts for d/c

## 2013-07-06 LAB — URINE CULTURE

## 2013-07-09 ENCOUNTER — Encounter (HOSPITAL_COMMUNITY): Payer: Self-pay

## 2013-07-09 ENCOUNTER — Inpatient Hospital Stay (HOSPITAL_COMMUNITY)
Admission: EM | Admit: 2013-07-09 | Discharge: 2013-07-16 | DRG: 974 | Disposition: A | Discharge status: Home / Self Care | Payer: Medicaid Other | Attending: Internal Medicine | Admitting: Internal Medicine

## 2013-07-09 ENCOUNTER — Emergency Department (HOSPITAL_COMMUNITY): Payer: Medicaid Other

## 2013-07-09 DIAGNOSIS — Z681 Body mass index (BMI) 19 or less, adult: Secondary | ICD-10-CM

## 2013-07-09 DIAGNOSIS — E079 Disorder of thyroid, unspecified: Secondary | ICD-10-CM | POA: Diagnosis present

## 2013-07-09 DIAGNOSIS — D638 Anemia in other chronic diseases classified elsewhere: Secondary | ICD-10-CM | POA: Diagnosis present

## 2013-07-09 DIAGNOSIS — R5381 Other malaise: Secondary | ICD-10-CM | POA: Diagnosis present

## 2013-07-09 DIAGNOSIS — K828 Other specified diseases of gallbladder: Secondary | ICD-10-CM

## 2013-07-09 DIAGNOSIS — B181 Chronic viral hepatitis B without delta-agent: Secondary | ICD-10-CM | POA: Diagnosis present

## 2013-07-09 DIAGNOSIS — J029 Acute pharyngitis, unspecified: Secondary | ICD-10-CM | POA: Diagnosis present

## 2013-07-09 DIAGNOSIS — D72819 Decreased white blood cell count, unspecified: Secondary | ICD-10-CM | POA: Diagnosis present

## 2013-07-09 DIAGNOSIS — D649 Anemia, unspecified: Secondary | ICD-10-CM

## 2013-07-09 DIAGNOSIS — K922 Gastrointestinal hemorrhage, unspecified: Secondary | ICD-10-CM

## 2013-07-09 DIAGNOSIS — A6000 Herpesviral infection of urogenital system, unspecified: Secondary | ICD-10-CM

## 2013-07-09 DIAGNOSIS — R42 Dizziness and giddiness: Secondary | ICD-10-CM | POA: Diagnosis present

## 2013-07-09 DIAGNOSIS — B1910 Unspecified viral hepatitis B without hepatic coma: Secondary | ICD-10-CM | POA: Diagnosis present

## 2013-07-09 DIAGNOSIS — F319 Bipolar disorder, unspecified: Secondary | ICD-10-CM | POA: Diagnosis present

## 2013-07-09 DIAGNOSIS — E43 Unspecified severe protein-calorie malnutrition: Secondary | ICD-10-CM | POA: Diagnosis present

## 2013-07-09 DIAGNOSIS — R072 Precordial pain: Secondary | ICD-10-CM | POA: Diagnosis present

## 2013-07-09 DIAGNOSIS — R748 Abnormal levels of other serum enzymes: Secondary | ICD-10-CM | POA: Diagnosis present

## 2013-07-09 DIAGNOSIS — K859 Acute pancreatitis without necrosis or infection, unspecified: Secondary | ICD-10-CM

## 2013-07-09 DIAGNOSIS — B3781 Candidal esophagitis: Secondary | ICD-10-CM | POA: Diagnosis present

## 2013-07-09 DIAGNOSIS — E875 Hyperkalemia: Secondary | ICD-10-CM | POA: Diagnosis present

## 2013-07-09 DIAGNOSIS — E876 Hypokalemia: Secondary | ICD-10-CM | POA: Diagnosis present

## 2013-07-09 DIAGNOSIS — R1013 Epigastric pain: Secondary | ICD-10-CM | POA: Diagnosis present

## 2013-07-09 DIAGNOSIS — R1011 Right upper quadrant pain: Secondary | ICD-10-CM

## 2013-07-09 DIAGNOSIS — R112 Nausea with vomiting, unspecified: Secondary | ICD-10-CM | POA: Diagnosis present

## 2013-07-09 DIAGNOSIS — D509 Iron deficiency anemia, unspecified: Secondary | ICD-10-CM | POA: Diagnosis present

## 2013-07-09 DIAGNOSIS — Z91199 Patient's noncompliance with other medical treatment and regimen due to unspecified reason: Secondary | ICD-10-CM

## 2013-07-09 DIAGNOSIS — K2210 Ulcer of esophagus without bleeding: Secondary | ICD-10-CM | POA: Diagnosis present

## 2013-07-09 DIAGNOSIS — R634 Abnormal weight loss: Secondary | ICD-10-CM

## 2013-07-09 DIAGNOSIS — B20 Human immunodeficiency virus [HIV] disease: Principal | ICD-10-CM | POA: Diagnosis present

## 2013-07-09 DIAGNOSIS — D62 Acute posthemorrhagic anemia: Secondary | ICD-10-CM | POA: Diagnosis present

## 2013-07-09 DIAGNOSIS — R109 Unspecified abdominal pain: Secondary | ICD-10-CM

## 2013-07-09 DIAGNOSIS — Z9119 Patient's noncompliance with other medical treatment and regimen: Secondary | ICD-10-CM

## 2013-07-09 DIAGNOSIS — D34 Benign neoplasm of thyroid gland: Secondary | ICD-10-CM | POA: Diagnosis not present

## 2013-07-09 DIAGNOSIS — R1310 Dysphagia, unspecified: Secondary | ICD-10-CM | POA: Diagnosis present

## 2013-07-09 LAB — COMPREHENSIVE METABOLIC PANEL
BUN: 10 mg/dL (ref 6–23)
CO2: 23 mEq/L (ref 19–32)
Calcium: 9.7 mg/dL (ref 8.4–10.5)
Chloride: 101 mEq/L (ref 96–112)
Creatinine, Ser: 0.46 mg/dL — ABNORMAL LOW (ref 0.50–1.10)
GFR calc Af Amer: >90 mL/min (ref >90)
GFR calc non Af Amer: >90 mL/min (ref >90)
Total Bilirubin: 0.5 mg/dL (ref 0.3–1.2)

## 2013-07-09 LAB — URINALYSIS, ROUTINE W REFLEX MICROSCOPIC
Nitrite: NEGATIVE
Specific Gravity, Urine: 1.023 (ref 1.005–1.030)
Urobilinogen, UA: 1 mg/dL (ref 0.0–1.0)

## 2013-07-09 LAB — CBC
Hemoglobin: 7.2 g/dL — ABNORMAL LOW (ref 12.0–15.0)
MCH: 19.4 pg — ABNORMAL LOW (ref 26.0–34.0)
Platelets: 226 10*3/uL (ref 150–400)
RBC: 3.71 MIL/uL — ABNORMAL LOW (ref 3.87–5.11)
WBC: 2.6 10*3/uL — ABNORMAL LOW (ref 4.0–10.5)

## 2013-07-09 LAB — URINE MICROSCOPIC-ADD ON

## 2013-07-09 LAB — LIPASE, BLOOD: Lipase: 92 U/L — ABNORMAL HIGH (ref 11–59)

## 2013-07-09 LAB — PREPARE RBC (CROSSMATCH)

## 2013-07-09 MED ORDER — EMTRICITABINE-TENOFOVIR DF 200-300 MG PO TABS
1.0000 | ORAL_TABLET | Freq: Every day | ORAL | Status: DC
  Administered 2013-07-09 – 2013-07-16 (×8): 1 via ORAL
  Filled 2013-07-09 (×11): qty 1

## 2013-07-09 MED ORDER — POLYSACCHARIDE IRON COMPLEX 150 MG PO CAPS
150.0000 mg | ORAL_CAPSULE | Freq: Two times a day (BID) | ORAL | Status: DC
  Administered 2013-07-10: 150 mg via ORAL
  Filled 2013-07-09 (×3): qty 1

## 2013-07-09 MED ORDER — ONDANSETRON HCL 4 MG/2ML IJ SOLN
4.0000 mg | Freq: Three times a day (TID) | INTRAMUSCULAR | Status: DC | PRN
  Administered 2013-07-09: 4 mg via INTRAVENOUS

## 2013-07-09 MED ORDER — ALBUTEROL SULFATE HFA 108 (90 BASE) MCG/ACT IN AERS
2.0000 | INHALATION_SPRAY | Freq: Four times a day (QID) | RESPIRATORY_TRACT | Status: DC | PRN
  Filled 2013-07-09: qty 6.7

## 2013-07-09 MED ORDER — HYDROMORPHONE HCL PF 1 MG/ML IJ SOLN
1.0000 mg | Freq: Once | INTRAMUSCULAR | Status: AC
  Administered 2013-07-09: 1 mg via INTRAVENOUS
  Filled 2013-07-09: qty 1

## 2013-07-09 MED ORDER — OLANZAPINE 10 MG PO TBDP
20.0000 mg | ORAL_TABLET | Freq: Every day | ORAL | Status: DC
  Administered 2013-07-09 – 2013-07-16 (×8): 20 mg via ORAL
  Filled 2013-07-09 (×9): qty 2

## 2013-07-09 MED ORDER — HYDROMORPHONE HCL PF 1 MG/ML IJ SOLN
1.0000 mg | INTRAMUSCULAR | Status: DC | PRN
  Administered 2013-07-10: 2 mg via INTRAVENOUS
  Administered 2013-07-10: 1 mg via INTRAVENOUS
  Administered 2013-07-10: 2 mg via INTRAVENOUS
  Administered 2013-07-11 – 2013-07-14 (×5): 1 mg via INTRAVENOUS
  Filled 2013-07-09 (×3): qty 1
  Filled 2013-07-09 (×2): qty 2
  Filled 2013-07-09: qty 1
  Filled 2013-07-09: qty 2
  Filled 2013-07-09 (×3): qty 1

## 2013-07-09 MED ORDER — NYSTATIN 100000 UNIT/ML MT SUSP
400000.0000 [IU] | Freq: Four times a day (QID) | OROMUCOSAL | Status: DC
  Administered 2013-07-09: 400000 [IU] via ORAL
  Administered 2013-07-10: 23:00:00 via ORAL
  Administered 2013-07-10 – 2013-07-12 (×8): 400000 [IU] via ORAL
  Administered 2013-07-12: 22:00:00 via ORAL
  Administered 2013-07-12 – 2013-07-16 (×10): 400000 [IU] via ORAL
  Filled 2013-07-09 (×30): qty 5

## 2013-07-09 MED ORDER — RITONAVIR 100 MG PO CAPS
100.0000 mg | ORAL_CAPSULE | Freq: Every day | ORAL | Status: DC
  Administered 2013-07-09 – 2013-07-13 (×5): 100 mg via ORAL
  Filled 2013-07-09 (×8): qty 1

## 2013-07-09 MED ORDER — DARUNAVIR ETHANOLATE 800 MG PO TABS
800.0000 mg | ORAL_TABLET | Freq: Every day | ORAL | Status: DC
  Administered 2013-07-09 – 2013-07-16 (×8): 800 mg via ORAL
  Filled 2013-07-09 (×11): qty 1

## 2013-07-09 MED ORDER — ADULT MULTIVITAMIN W/MINERALS CH
1.0000 | ORAL_TABLET | Freq: Every day | ORAL | Status: DC
  Administered 2013-07-09 – 2013-07-16 (×4): 1 via ORAL
  Filled 2013-07-09 (×9): qty 1

## 2013-07-09 MED ORDER — PROMETHAZINE HCL 25 MG/ML IJ SOLN
12.5000 mg | INTRAMUSCULAR | Status: DC | PRN
  Administered 2013-07-10: 25 mg via INTRAVENOUS
  Administered 2013-07-11: 12.5 mg via INTRAVENOUS
  Administered 2013-07-12: 25 mg via INTRAVENOUS
  Administered 2013-07-12 – 2013-07-15 (×2): 12.5 mg via INTRAVENOUS
  Filled 2013-07-09 (×6): qty 1

## 2013-07-09 MED ORDER — SODIUM CHLORIDE 0.9 % IV SOLN
INTRAVENOUS | Status: AC
  Administered 2013-07-09: 16:00:00 via INTRAVENOUS

## 2013-07-09 MED ORDER — ONDANSETRON HCL 4 MG/2ML IJ SOLN
4.0000 mg | Freq: Once | INTRAMUSCULAR | Status: AC
  Administered 2013-07-09: 4 mg via INTRAVENOUS
  Filled 2013-07-09: qty 2

## 2013-07-09 MED ORDER — SODIUM CHLORIDE 0.9 % IV BOLUS (SEPSIS)
1000.0000 mL | Freq: Once | INTRAVENOUS | Status: AC
  Administered 2013-07-09: 1000 mL via INTRAVENOUS

## 2013-07-09 MED ORDER — SODIUM CHLORIDE 0.9 % IV SOLN
INTRAVENOUS | Status: DC
  Administered 2013-07-10 – 2013-07-12 (×6): via INTRAVENOUS

## 2013-07-09 MED ORDER — ONDANSETRON HCL 4 MG/2ML IJ SOLN
INTRAMUSCULAR | Status: AC
  Administered 2013-07-09: 4 mg
  Filled 2013-07-09: qty 2

## 2013-07-09 MED ORDER — HYDROMORPHONE HCL PF 1 MG/ML IJ SOLN
1.0000 mg | INTRAMUSCULAR | Status: DC | PRN
  Administered 2013-07-09: 1 mg via INTRAVENOUS
  Filled 2013-07-09: qty 1

## 2013-07-09 MED ORDER — POTASSIUM CHLORIDE CRYS ER 20 MEQ PO TBCR
40.0000 meq | EXTENDED_RELEASE_TABLET | Freq: Once | ORAL | Status: DC
  Filled 2013-07-09: qty 2

## 2013-07-09 MED ORDER — HYDROCODONE-ACETAMINOPHEN 5-325 MG PO TABS
1.0000 | ORAL_TABLET | ORAL | Status: DC | PRN
  Administered 2013-07-13: 1 via ORAL
  Administered 2013-07-14 (×2): 2 via ORAL
  Filled 2013-07-09 (×3): qty 1
  Filled 2013-07-09: qty 2

## 2013-07-09 MED ORDER — ENOXAPARIN SODIUM 40 MG/0.4ML ~~LOC~~ SOLN
40.0000 mg | SUBCUTANEOUS | Status: DC
  Administered 2013-07-09 – 2013-07-10 (×2): 40 mg via SUBCUTANEOUS
  Filled 2013-07-09 (×8): qty 0.4

## 2013-07-09 MED ORDER — PANTOPRAZOLE SODIUM 40 MG PO TBEC
40.0000 mg | DELAYED_RELEASE_TABLET | Freq: Every day | ORAL | Status: DC
  Administered 2013-07-09 – 2013-07-16 (×8): 40 mg via ORAL
  Filled 2013-07-09 (×8): qty 1

## 2013-07-09 MED ORDER — BENZTROPINE MESYLATE 2 MG PO TABS
2.0000 mg | ORAL_TABLET | Freq: Every day | ORAL | Status: DC
  Administered 2013-07-09 – 2013-07-16 (×8): 2 mg via ORAL
  Filled 2013-07-09 (×9): qty 1

## 2013-07-09 NOTE — Progress Notes (Signed)
[facility] provided patient with a list of primary care resources. Patient stated that she was pending Medicaid.  °

## 2013-07-09 NOTE — ED Notes (Signed)
Patient stated that she passed out this morning. Unknown why she passed out.  Stated that she has not been able to eat or drink because of esophagitis. Was here last Sunday due to chest pain that was due to gall bladder per ultrasound.

## 2013-07-09 NOTE — Consult Note (Signed)
Reason for Consult:  RUQ abdominal pain, N/V Referring Physician: Johnnette Gourd PA-C/Dr. August Saucer [name] is an 37 y.o. female.  HPI:  37 y/o female with a PMHx of HIV, hepatitis B, acute psychosis and thyroid disease presents to the ED complaining of worsening RUQ abdominal pain and weakness after being seen in the ED on July 20. She had an abdominal ultrasound on that day which showed gallbladder sludge. The rest of her workup was negative, including CT angiogram of or embolism and full cardiac workup. She c/o RUQ pain radiates up into her chest with deep inspiration since April 2014. Also c/o radiating pain to the mid back.  Patient states she's had no appetite for the last month due to "esophagitis", has been nauseous, vomiting, multiple episodes of diarrhea yesterday. Otherwise she rarely has had a BM over the last month due to eating very little.  Denies fever or chills, CP/SOB. Also denies excessive NSAIDs, Etoh, caffeine.  This morning when she she felt lightheaded, went to the bathroom around 2am because she thought she had to vomit or have a BM, and states she "passed out" for a few seconds. Her son heard her fall, came into the bathroom and picked her up.  Thus she was brought to [facility] for further evaluation.  We were asked to see the patient regarding possible cholecystitis.  Her US shows gallbladder sludge, but no other signs of cholecystitis.    Her CD-4 T cell abs <10 and CD4% helper T cell 1 in 07/2012, no more recent labs found.  She has a h/o pneumocystis carinii pneumonia and now possible esophagitis.  She has admitted to not taking any of her medications in the last 1 month due to inability of swallowing due to throat pain.  H/o partial thyroidectomy (benign thyroid adenoma) and tubal ligation.  Past Medical History  Diagnosis Date  . HIV positive   . Hepatitis B     /E-chart  . Microcytic anemia     h/o per E-chart  . Pneumonia 02/2009    bilaterlly; most likely consistent  w/pneumocystis carinii/e-chart  . Noncompliance with medication regimen     /e-chart  . 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 Pyelonephritis     h/o per E-chart  . Thyroid disease     Past Surgical History  Procedure Laterality Date  . Thyroid surgery    . Thyroid lobectomy  08/2002    left & isthmectomy; for benign thyroid adenoma/E-chart  . Tubal ligation  07/2002    /E-chart    History reviewed. No pertinent family history.  Social History:  reports that she has never smoked. She has never used smokeless tobacco. She reports that she does not drink alcohol or use illicit drugs.  Allergies:  Allergies  Allergen Reactions  . Bactrim Itching  . Dapsone Itching  . Orange Fruit Itching  . Peanut-Containing Drug Products Hives  . Shellfish Allergy Hives    Medications: Madison have reviewed the patient's current medications.  Results for orders placed during the hospital encounter of 07/09/13 (from the past 48 hour(s))  COMPREHENSIVE METABOLIC PANEL     Status: Abnormal   Collection Time    07/09/13 12:00 PM      Result Value Range   Sodium 138  135 - 145 mEq/L   Potassium 3.4 (*) 3.5 - 5.1 mEq/L   Chloride 101  96 - 112 mEq/L   CO2 23  19 -  32 mEq/L   Glucose, Bld 88  70 - 99 mg/dL   BUN 10  6 - 23 mg/dL   Creatinine, Ser 1.61 (*) 0.50 - 1.10 mg/dL   Calcium 9.7  8.4 - 09.6 mg/dL   Total Protein 7.5  6.0 - 8.3 g/dL   Albumin 3.4 (*) 3.5 - 5.2 g/dL   AST 37  0 - 37 U/L   ALT 20  0 - 35 U/L   Alkaline Phosphatase 145 (*) 39 - 117 U/L   Total Bilirubin 0.5  0.3 - 1.2 mg/dL   GFR calc non Af Amer >90  >90 mL/min   GFR calc Af Amer >90  >90 mL/min   Comment:            The eGFR has been calculated     using the CKD EPI equation.     This calculation has not been     validated in all clinical     situations.     eGFR's persistently     <90 mL/min signify     possible Chronic Kidney Disease.  CBC     Status: Abnormal   Collection Time     07/09/13 12:00 PM      Result Value Range   WBC 2.6 (*) 4.0 - 10.5 K/uL   Comment: ADJUSTED FOR NUCLEATED RBC'S   RBC 3.71 (*) 3.87 - 5.11 MIL/uL   Hemoglobin 7.2 (*) 12.0 - 15.0 g/dL   HCT 04.5 (*) 40.9 - 81.1 %   MCV 62.3 (*) 78.0 - 100.0 fL   MCH 19.4 (*) 26.0 - 34.0 pg   MCHC 31.2  30.0 - 36.0 g/dL   RDW 91.4 (*) 78.2 - 95.6 %   Platelets 226  150 - 400 K/uL  LIPASE, BLOOD     Status: Abnormal   Collection Time    07/09/13 12:00 PM      Result Value Range   Lipase 92 (*) 11 - 59 U/L  OCCULT BLOOD, POC DEVICE     Status: Abnormal   Collection Time    07/09/13  1:11 PM      Result Value Range   Fecal Occult Bld POSITIVE (*) NEGATIVE    US Abdomen Complete  07/09/2013   *RADIOLOGY REPORT*  Abdominal ultrasound  History:  Upper abdominal pain  Comparison:  July 04, 2013  Findings:  Gallbladder is visualized in multiple projections. Sludge is again noted in the gallbladder, unchanged.  There are no echogenic foci in the gallbladder which move and shadow as is expected with gallstones.  There is no gallbladder wall thickening or pericholecystic fluid collection.  There is no intrahepatic, common hepatic, or common bile duct dilatation.  Pancreas appears normal.  No focal liver lesions are identified.  There are small calcified granulomas in the spleen.  Spleen otherwise appears normal. Kidneys bilaterally appear normal.  There is no ascites.  Aorta is nonaneurysmal.  Inferior vena cava appears normal.  Conclusion:  Sludge in gallbladder but no gallstones.  This appearance is stable compared to recent prior study.  There are small splenic granulomas.  Study otherwise within normal limits.   Original Report Authenticated By: Bretta Bang, M.D.    Review of Systems  Constitutional: Positive for weight loss and malaise/fatigue (30+lbs in the last month). Negative for fever, chills and diaphoresis.  HENT: Negative for hearing loss, ear pain, congestion and sore throat.   Eyes: Negative  for blurred vision.  Respiratory: Negative for cough, sputum production, shortness  of breath and wheezing.   Cardiovascular: Negative for chest pain and palpitations.  Gastrointestinal: Positive for nausea, vomiting, abdominal pain (RUQ, epigastric), diarrhea and constipation. Negative for heartburn and blood in stool.  Genitourinary: Negative for dysuria.  Neurological: Positive for dizziness, weakness and headaches.   Blood pressure 124/77, pulse 70, temperature 97.4 F (36.3 C), temperature source Oral, resp. rate 13, last menstrual period 06/24/2013, SpO2 100.00%. Physical Exam  Constitutional: She is oriented to person, place, and time. She appears well-developed. She appears cachectic. She is cooperative. She appears distressed.  HENT:  Head: Normocephalic and atraumatic.  Nose: Nose normal.  Mouth/Throat: Oropharynx is clear and moist. No oropharyngeal exudate.  Eyes: Conjunctivae and EOM are normal. Pupils are equal, round, and reactive to light. Right eye exhibits no discharge. Left eye exhibits no discharge. No scleral icterus.  Cardiovascular: Normal rate and regular rhythm.  Exam reveals no gallop and no friction rub.   No murmur heard. Respiratory: Effort normal and breath sounds normal. No respiratory distress. She has no wheezes. She has no rales.  GI: Soft. Bowel sounds are normal. She exhibits no distension, no ascites and no mass (subjective guarding). There is tenderness (Upper abdominal pain >epigastric) in the right upper quadrant, epigastric area and left upper quadrant. There is guarding. There is no rebound. No hernia.  Musculoskeletal: Normal range of motion. She exhibits no edema.  Neurological: She is alert and oriented to person, place, and time.  Skin: Skin is warm and dry. No rash noted. No erythema. No pallor.  Psychiatric: Her mood appears anxious.    Assessment/Plan: Right upper quadrant abdominal pain Nausea and vomiting HIV progression to AIDS?, Hep  C Unintentional weight loss >30lbs Pancreatitis Non-compliance with medical treatment (HIV meds)    Plan: 1.  Will discuss with Dr. Daphine Deutscher, but will likely need a CT scan or HIDA scan to further evaluate.  I do not get the feeling that this is cholecystitis based off her history and physical.   2.  Would recommend a GI consult to further investigate the possibility of esophagitis, gastritis, and PUD as she has not had an EGD to evaluate this suspected diagnosis given her immunocompromise. 3.  Conservative tx for pancreatitis - NPO, IVF, pain and nausea control 4.  Medicine to admit, will follow along with you 5.  Repeat labs tomorrow  Aris Georgia 07/09/2013, 2:50 PM

## 2013-07-09 NOTE — ED Provider Notes (Signed)
CSN: [id number]     Arrival date & time 07/09/13  1120 History     First MD Initiated Contact with Patient 07/09/13 1122     No chief complaint on file.  (Consider location/radiation/quality/duration/timing/severity/associated sxs/prior Treatment) HPI Comments: 37 y/o female with a PMHx of HIV, hepatitis B, acute psychosis and thyroid disease presents to the emergency department complaining of worsening right upper quadrant abdominal pain and weakness after being seen in the emergency department on July 20. She had an abdominal ultrasound on that day which showed gallbladder sludge. The rest of her workup was negative, including CT angiogram of or embolism and full cardiac workup. RUQ pain radiates up into her chest with deep inspiration. Patient states she's had no appetite, has been nauseous, vomiting, multiple episodes of diarrhea yesterday. Denies fever or chills. This morning when she she felt lightheaded, went to the bathroom and states she "passed out" for a few seconds. Her son heard her fall, came into the bathroom and picked her up.  The history is provided by the patient.    Past Medical History  Diagnosis Date  . HIV positive   . Hepatitis B     /E-chart  . Microcytic anemia     h/o per E-chart  . 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 Noncompliance with medication regimen     /e-chart  . 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 Pyelonephritis     h/o per E-chart  . Thyroid disease    Past Surgical History  Procedure Laterality Date  . Thyroid surgery    . Thyroid lobectomy  08/2002    left & isthmectomy; for benign thyroid adenoma/E-chart  . Tubal ligation  07/2002    /E-chart   No family history on file. History  Substance Use Topics  . Smoking status: Never Smoker   . Smokeless tobacco: Never Used  . Alcohol Use: Yes   OB History   Grav Para Term Preterm Abortions TAB SAB Ect Mult  Living                 Review of Systems  Constitutional: Positive for appetite change and fatigue. Negative for fever and chills.  Respiratory: Negative for shortness of breath.   Cardiovascular: Negative for chest pain.  Gastrointestinal: Positive for nausea, vomiting, abdominal pain and diarrhea. Negative for blood in stool.  Musculoskeletal: Negative for back pain.  Neurological: Positive for syncope, weakness and light-headedness.  All other systems reviewed and are negative.    Allergies  Bactrim; Dapsone; Orange fruit; Peanut-containing drug products; and Shellfish allergy  Home Medications   Current Outpatient Rx  Name  Route  Sig  Dispense  Refill  . acetaminophen (TYLENOL) 500 MG tablet   Oral   Take 1,000 mg by mouth every 6 (six) hours as needed for pain.         Marland Kitchen albuterol (PROVENTIL HFA;VENTOLIN HFA) 108 (90 BASE) MCG/ACT inhaler   Inhalation   Inhale 2 puffs into the lungs every 6 (six) hours as needed for wheezing.         . benztropine (COGENTIN) 2 MG tablet   Oral   Take 2 mg by mouth daily.         . darunavir (PREZISTA) 400 MG tablet   Oral   Take 800 mg by mouth daily.         Marland Kitchen dexlansoprazole (DEXILANT) 60 MG capsule  Oral   Take 60 mg by mouth daily.         Marland Kitchen emtricitabine-tenofovir (TRUVADA) 200-300 MG per tablet   Oral   Take 1 tablet by mouth daily.         Marland Kitchen HYDROcodone-acetaminophen (HYCET) 7.5-325 mg/15 ml solution   Oral   Take 15 mLs by mouth 4 (four) times daily as needed for pain.   120 mL   0   . iron polysaccharides (NIFEREX) 150 MG capsule   Oral   Take 150 mg by mouth 2 (two) times daily.         . Multiple Vitamins-Minerals (WOMENS MULTI VITAMIN & MINERAL PO)   Oral   Take 2 each by mouth daily. Gummy vitamins         . nystatin (MYCOSTATIN) 100000 UNIT/ML suspension   Oral   Take 4 mLs (400,000 Units total) by mouth 4 (four) times daily.   60 mL   0     Swish and swallow   . OLANZapine zydis  (ZYPREXA) 20 MG disintegrating tablet   Oral   Take 20 mg by mouth daily.         . ondansetron (ZOFRAN) 4 MG tablet   Oral   Take 1 tablet (4 mg total) by mouth every 6 (six) hours.   12 tablet   0   . ondansetron (ZOFRAN) 4 MG tablet   Oral   Take 1 tablet (4 mg total) by mouth every 8 (eight) hours as needed for nausea.   20 tablet   0   . oxyCODONE-acetaminophen (PERCOCET/ROXICET) 5-325 MG per tablet   Oral   Take 1-2 tablets by mouth every 4 (four) hours as needed for pain.   20 tablet   0   . ritonavir (NORVIR) 100 MG capsule   Oral   Take 100 mg by mouth daily.          BP 104/75  Pulse 88  Temp(Src) 97.4 F (36.3 C) (Oral)  Resp 16  SpO2 97%  LMP 06/24/2013 Physical Exam  Nursing note and vitals reviewed. Constitutional: She is oriented to person, place, and time. She appears well-developed and well-nourished. No distress.  HENT:  Head: Normocephalic and atraumatic.  Mouth/Throat: Oropharynx is clear and moist.  Eyes: Conjunctivae are normal. No scleral icterus.  Neck: Normal range of motion. Neck supple.  Cardiovascular: Normal rate, regular rhythm and normal heart sounds.   Pulmonary/Chest: Effort normal and breath sounds normal.  Abdominal: Soft. Normal appearance and bowel sounds are normal. She exhibits no distension and no mass. There is tenderness in the right upper quadrant and epigastric area. There is guarding and positive Murphy's sign. There is no rigidity, no rebound and no CVA tenderness.  No peritoneal signs.  Genitourinary: Rectal exam shows tenderness. Rectal exam shows no external hemorrhoid, no internal hemorrhoid, no fissure and no mass.  Musculoskeletal: Normal range of motion. She exhibits no edema.  Neurological: She is alert and oriented to person, place, and time.  Skin: Skin is warm and dry. She is not diaphoretic.  Psychiatric: She has a normal mood and affect. Her behavior is normal.    ED Course   Procedures (including  critical care time)  Labs Reviewed  COMPREHENSIVE METABOLIC PANEL - Abnormal; Notable for the following:    Potassium 3.4 (*)    Creatinine, Ser 0.46 (*)    Albumin 3.4 (*)    Alkaline Phosphatase 145 (*)    All other components within normal  limits  CBC - Abnormal; Notable for the following:    WBC 2.6 (*)    RBC 3.71 (*)    Hemoglobin 7.2 (*)    HCT 23.1 (*)    MCV 62.3 (*)    MCH 19.4 (*)    RDW 19.1 (*)    All other components within normal limits  LIPASE, BLOOD - Abnormal; Notable for the following:    Lipase 92 (*)    All other components within normal limits  OCCULT BLOOD, POC DEVICE - Abnormal; Notable for the following:    Fecal Occult Bld POSITIVE (*)    All other components within normal limits  URINALYSIS, ROUTINE W REFLEX MICROSCOPIC    Date: 07/09/2013  Rate: 75  Rhythm: normal sinus rhythm  QRS Axis: normal  Intervals: normal  ST/T Wave abnormalities: nonspecific T wave changes diffuse leads  Conduction Disutrbances:none  Narrative Interpretation: no sig changes since EKG obtained on 07/04/2013  Old EKG Reviewed: unchanged   US Abdomen Complete  07/09/2013   *RADIOLOGY REPORT*  Abdominal ultrasound  History:  Upper abdominal pain  Comparison:  July 04, 2013  Findings:  Gallbladder is visualized in multiple projections. Sludge is again noted in the gallbladder, unchanged.  There are no echogenic foci in the gallbladder which move and shadow as is expected with gallstones.  There is no gallbladder wall thickening or pericholecystic fluid collection.  There is no intrahepatic, common hepatic, or common bile duct dilatation.  Pancreas appears normal.  No focal liver lesions are identified.  There are small calcified granulomas in the spleen.  Spleen otherwise appears normal. Kidneys bilaterally appear normal.  There is no ascites.  Aorta is nonaneurysmal.  Inferior vena cava appears normal.  Conclusion:  Sludge in gallbladder but no gallstones.  This appearance is  stable compared to recent prior study.  There are small splenic granulomas.  Study otherwise within normal limits.   Original Report Authenticated By: Bretta Bang, M.D.   1. Abdominal pain   2. Gallbladder sludge   3. GI bleed   4. Anemia     MDM  Patient with weakness, RUQ abdominal pain. Evaluated 5 days ago for similar, however symptoms worsened. TTP of RUQ, mid-epigastric region of abdomen. Will repeat abdominal US to evaluate for any changes from gall bladder sludge. Labs- cbc, cmp, lipase, ua, urine preg. Dilaudid, zofran, fluids. 2:19 PM Abdominal US showing gallbladder sludge, no change from prior examination. Hgb 7.2, decreased from 8.1 on 7/20, lipase 92, increased from 80 on 7/20 and alk phos 145, decreased from 178 on 7/20. Patient immunocompromised due to HIV/AIDS, WBC 2.6. Consult from [facility] and medicine appreciated. [facility] will evaluate patient for possible surgery. Patient also with GI bleed, occult blood positive. Admission accepted by Dr. Izola Price, [facility] Patient discussed with Dr. Anitra Lauth who agrees with plan of care.   [name], PA-C 07/09/13 1424

## 2013-07-09 NOTE — H&P (Signed)
[facility] History and Physical  [name] QIO:[id number] DOB: [birth year] DOA: 07/09/2013  Referring physician: ED physician PCP: Default, Provider, MD   Chief Complaint: Abdominal pain   HPI:  37 y/o female with a PMHx of HIV, hepatitis B, acute psychosis and thyroid disease presents to the ED with main concern of progressively worsening RUQ area pain, sharp, radiating to epigastric area and chest, worse with deep inspiration and with no specific alleviating factors, intermittent and 7/10 in severity when present, associated with nausea and poor oral intake. She has been seen in the ED on July 20 and had an abdominal ultrasound on that day which showed gallbladder sludge. The rest of her workup was negative, including CT angiogram, negative cardiac work up. She denies fevers, chills, no specific urinary concerns, no recent sick contacts or exposures, no other systemic concerns, no recent changes in mediations.   In ED, pt with persistent abdominal pain and sludge in gallbladder noted on abdominal US, surgery consulted by ED physician and [facility] asked to admit for further evaluation.   Assessment and Plan:  Principal Problem:   Abdominal pain, RUQ - possibly related to biliary sludge - appreciate surgery input - will keep pt NPO for now and provide supportive care with IVF, analgesia and antiemetics as needed Active Problems:   Acute blood loss anemia imposed on anemia of chronic disease - unclear etiology - will plan on transfusing two units of PRBC, repeat CBC in AM   Leukopenia - likely secondary to compromised immune system in the setting of HIV - continue HAART  - CBC in AM   HIV INFECTION - CD#4 <10 in 07/2012, no more recent labs found - continue HAART    Bipolar disorder - appears to be clinically stable at this time    Hypokalemia - mild and will supplement, repeat BMP in AM  Code Status: Full Family Communication: Pt at bedside Disposition Plan: Admit to  medical floor   Review of Systems:  Constitutional: Negative for fever, chills. Negative for diaphoresis.  HENT: Negative for hearing loss, ear pain, nosebleeds, congestion, sore throat, neck pain, tinnitus and ear discharge.   Eyes: Negative for blurred vision, double vision, photophobia, pain, discharge and redness.  Respiratory: Negative for cough, hemoptysis, sputum production, shortness of breath, wheezing and stridor.   Cardiovascular: Negative for chest pain, palpitations, orthopnea, claudication and leg swelling.  Gastrointestinal: Negative for heartburn, constipation, blood in stool and melena.  Genitourinary: Negative for dysuria, urgency, frequency, hematuria and flank pain.  Musculoskeletal: Negative for myalgias, back pain, joint pain and falls.  Skin: Negative for itching and rash.  Neurological: Negative for dizziness and weakness. Negative for tingling, tremors, sensory change, speech change, focal weakness, loss of consciousness and headaches.  Endo/Heme/Allergies: Negative for environmental allergies and polydipsia. Does not bruise/bleed easily.  Psychiatric/Behavioral: Negative for suicidal ideas. The patient is not nervous/anxious.      Past Medical History  Diagnosis Date  . HIV positive   . Hepatitis B     /E-chart  . Microcytic anemia     h/o per E-chart  . 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 Noncompliance with medication regimen     /e-chart  . 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 Pyelonephritis     h/o per E-chart  . Thyroid disease     Past Surgical History  Procedure Laterality Date  . Thyroid surgery    .  Thyroid lobectomy  08/2002    left & isthmectomy; for benign thyroid adenoma/E-chart  . Tubal ligation  07/2002    /E-chart    Social History:  reports that she has never smoked. She has never used smokeless tobacco. She reports that she does not drink alcohol or use  illicit drugs.  Allergies  Allergen Reactions  . Bactrim Itching  . Dapsone Itching  . Orange Fruit Itching  . Peanut-Containing Drug Products Hives  . Shellfish Allergy Hives    No known family medical problems.   Prior to Admission medications   Medication Sig Start Date End Date Taking? Authorizing Provider  acetaminophen (TYLENOL) 500 MG tablet Take 1,000 mg by mouth every 6 (six) hours as needed for pain.   Yes Historical Provider, MD  albuterol (PROVENTIL HFA;VENTOLIN HFA) 108 (90 BASE) MCG/ACT inhaler Inhale 2 puffs into the lungs every 6 (six) hours as needed for wheezing.   Yes Historical Provider, MD  benztropine (COGENTIN) 2 MG tablet Take 2 mg by mouth daily.   Yes Historical Provider, MD  darunavir (PREZISTA) 400 MG tablet Take 800 mg by mouth daily.   Yes Historical Provider, MD  dexlansoprazole (DEXILANT) 60 MG capsule Take 60 mg by mouth daily.   Yes Historical Provider, MD  emtricitabine-tenofovir (TRUVADA) 200-300 MG per tablet Take 1 tablet by mouth daily.   Yes Historical Provider, MD  HYDROcodone-acetaminophen (HYCET) 7.5-325 mg/15 ml solution Take 15 mLs by mouth 4 (four) times daily as needed for pain. 07/04/13 07/04/14 Yes Raeford Razor, MD  iron polysaccharides (NIFEREX) 150 MG capsule Take 150 mg by mouth 2 (two) times daily.   Yes Historical Provider, MD  Multiple Vitamins-Minerals (WOMENS MULTI VITAMIN & MINERAL PO) Take 2 each by mouth daily. Gummy vitamins   Yes Historical Provider, MD  nystatin (MYCOSTATIN) 100000 UNIT/ML suspension Take 4 mLs (400,000 Units total) by mouth 4 (four) times daily. 07/04/13  Yes Raeford Razor, MD  OLANZapine zydis (ZYPREXA) 20 MG disintegrating tablet Take 20 mg by mouth daily.   Yes Historical Provider, MD  ondansetron (ZOFRAN) 4 MG tablet Take 1 tablet (4 mg total) by mouth every 8 (eight) hours as needed for nausea. 07/04/13  Yes Raeford Razor, MD  ritonavir (NORVIR) 100 MG capsule Take 100 mg by mouth daily.   Yes Historical  Provider, MD  oxyCODONE-acetaminophen (PERCOCET/ROXICET) 5-325 MG per tablet Take 1-2 tablets by mouth every 4 (four) hours as needed for pain. 07/04/13   Raeford Razor, MD    Physical Exam: Filed Vitals:   07/09/13 1505 07/09/13 1506 07/09/13 1525 07/09/13 1556  BP: 110/66 110/66  130/80  Pulse: 61 62  55  Temp:  97.8 F (36.6 C) 97.7 F (36.5 C) 97.6 F (36.4 C)  TempSrc:  Oral Oral Oral  Resp: 20 13  16   Height:    6' (1.829 m)  SpO2: 100% 100%  100%    Physical Exam  Constitutional: Appears well-developed and well-nourished. No distress.  HENT: Normocephalic. External right and left ear normal. Oropharynx is clear and moist.  Eyes: Conjunctivae and EOM are normal. PERRLA, no scleral icterus.  Neck: Normal ROM. Neck supple. No JVD. No tracheal deviation. No thyromegaly.  CVS: RRR, S1/S2 +, no murmurs, no gallops, no carotid bruit.  Pulmonary: Effort and breath sounds normal, no stridor, rhonchi, wheezes, rales.  Abdominal: Soft. BS +,  no distension, tenderness in RUQ area, no rebound or guarding.  Musculoskeletal: Normal range of motion. No edema and no tenderness.  Lymphadenopathy: No  lymphadenopathy noted, cervical, inguinal. Neuro: Alert. Normal reflexes, muscle tone coordination. No cranial nerve deficit. Skin: Skin is warm and dry. No rash noted. Not diaphoretic. No erythema. No pallor.  Psychiatric: Normal mood and affect. Behavior, judgment, thought content normal.   Labs on Admission:  Basic Metabolic Panel:  Recent Labs Lab 07/04/13 1415 07/09/13 1200  NA 137 138  K 3.3* 3.4*  CL 98 101  CO2 23 23  GLUCOSE 105* 88  BUN 11 10  CREATININE 0.59 0.46*  CALCIUM 9.8 9.7   Liver Function Tests:  Recent Labs Lab 07/04/13 1415 07/09/13 1200  AST 56* 37  ALT 37* 20  ALKPHOS 178* 145*  BILITOT 0.5 0.5  PROT 8.4* 7.5  ALBUMIN 3.8 3.4*    Recent Labs Lab 07/04/13 1415 07/09/13 1200  LIPASE 80* 92*   CBC:  Recent Labs Lab 07/04/13 1415  07/09/13 1200  WBC 3.8* 2.6*  NEUTROABS 3.1  --   HGB 8.1* 7.2*  HCT 26.0* 23.1*  MCV 62.4* 62.3*  PLT 255 226   Cardiac Enzymes:  Recent Labs Lab 07/04/13 1415  TROPONINI <0.30   Radiological Exams on Admission: US Abdomen Complete  07/09/2013   *RADIOLOGY REPORT*  Abdominal ultrasound  History:  Upper abdominal pain  Comparison:  July 04, 2013  Findings:  Gallbladder is visualized in multiple projections. Sludge is again noted in the gallbladder, unchanged.  There are no echogenic foci in the gallbladder which move and shadow as is expected with gallstones.  There is no gallbladder wall thickening or pericholecystic fluid collection.  There is no intrahepatic, common hepatic, or common bile duct dilatation.  Pancreas appears normal.  No focal liver lesions are identified.  There are small calcified granulomas in the spleen.  Spleen otherwise appears normal. Kidneys bilaterally appear normal.  There is no ascites.  Aorta is nonaneurysmal.  Inferior vena cava appears normal.  Conclusion:  Sludge in gallbladder but no gallstones.  This appearance is stable compared to recent prior study.  There are small splenic granulomas.  Study otherwise within normal limits.   Original Report Authenticated By: Bretta Bang, M.D.   EKG: Normal sinus rhythm, no ST/T wave changes  Debbora Presto, MD  [facility] Pager [phone]  If 7PM-7AM, please contact night-coverage [email or web address] Password [facility] 07/09/2013, 4:25 PM

## 2013-07-09 NOTE — ED Notes (Signed)
MD at bedside. 

## 2013-07-09 NOTE — Progress Notes (Signed)
INITIAL NUTRITION ASSESSMENT  Pt meets criteria for severe MALNUTRITION in the context of acute illness as evidenced by <50% estimated energy intake in the past week with 21.8% weight loss in the past month per pt report in addition to pt with severe muscle wasting and subcutaneous fat loss throughout body.  DOCUMENTATION CODES Per approved criteria  -Severe malnutrition in the context of chronic illness -Underweight   INTERVENTION: - Multivitamin 1 tablet PO daily - Diet advancement per MD - Recommend MD monitor pt's potassium, magnesium, and phosphorus (and replete as needed) daily for at least 3 days once diet started to monitor for refeeding syndrome due to pt's prolonged poor PO intake and severe weight loss - Will continue to monitor   NUTRITION DIAGNOSIS: Inadequate oral intake related to inability to eat as evidenced by NPO.   Goal: 1. No further nausea/vomiting 2. Advance diet as tolerated to regular diet  Monitor:  Weights, labs, nausea/vomiting, diet advancement  Reason for Assessment: Nutrition risk   37 y.o. female  Admitting Dx: Abdominal pain, epigastric  ASSESSMENT: Pt admitted with progressively worsening RUQ pain with nausea and poor intake. Met with pt who reports no appetite for the past month with 35 pound unintended weight loss. States that her throat would hurt and anything she ate/drink, even water, would be painful to swallow. States she was vomiting 2 times/day for the past week and had ongoing nausea. Pt reports before last month she was eating 2 meals/day and drinking 2 Ensure/day. Pt denies any vomiting today but had some nausea. Pt cachetic.   Nutrition Focused Physical Exam:  Subcutaneous Fat:  Orbital Region: severe wasting Upper Arm Region: severe wasting Thoracic and Lumbar Region: severe wasting  Muscle:  Temple Region: severe wasting Clavicle Bone Region: severe wasting Clavicle and Acromion Bone Region: severe wasting Scapular Bone  Region: severe wasting Dorsal Hand: severe wasting Patellar Region: severe wasting Anterior Thigh Region: severe wasting Posterior Calf Region: severe wasting  Edema: None noted   Height: Ht Readings from Last 1 Encounters:  07/09/13 6' (1.829 m)    Weight: Wt Readings from Last 1 Encounters:  07/09/13 125 lb 7.1 oz (56.9 kg)    Ideal Body Weight: 178 lb  % Ideal Body Weight: 70%  Wt Readings from Last 10 Encounters:  07/09/13 125 lb 7.1 oz (56.9 kg)  04/16/12 155 lb (70.308 kg)  04/01/12 147 lb 8 oz (66.906 kg)  03/16/12 144 lb 6.4 oz (65.5 kg)  02/20/12 154 lb 12 oz (70.194 kg)  11/05/11 150 lb (68.04 kg)  04/03/09 144 lb 6.4 oz (65.499 kg)  02/28/09 141 lb 11.2 oz (64.275 kg)    Usual Body Weight: 160 lb per pt  % Usual Body Weight: 78%  BMI:  Body mass index is 17.01 kg/(m^2). Underweight  Estimated Nutritional Needs: Kcal: 1700-1950 Protein: 70-85g Fluid: 1.7-1.9L/day  Skin: Intact  Diet Order: NPO  EDUCATION NEEDS: -Education needs addressed - educated pt on diet therapy for nausea/vomiting   Intake/Output Summary (Last 24 hours) at 07/09/13 1909 Last data filed at 07/09/13 1800  Gross per 24 hour  Intake      0 ml  Output      0 ml  Net      0 ml    Last BM: PTA  Labs:   Recent Labs Lab 07/04/13 1415 07/09/13 1200  NA 137 138  K 3.3* 3.4*  CL 98 101  CO2 23 23  BUN 11 10  CREATININE 0.59 0.46*  CALCIUM 9.8 9.7  GLUCOSE 105* 88    CBG (last 3)  No results found for this basename: GLUCAP,  in the last 72 hours  Scheduled Meds: . sodium chloride   Intravenous STAT  . benztropine  2 mg Oral Daily  . darunavir  800 mg Oral Q breakfast  . emtricitabine-tenofovir  1 tablet Oral Q breakfast  . enoxaparin (LOVENOX) injection  40 mg Subcutaneous Q24H  . iron polysaccharides  150 mg Oral BID  . nystatin  400,000 Units Oral QID  . OLANZapine zydis  20 mg Oral Daily  . pantoprazole  40 mg Oral Daily  . potassium chloride  40 mEq  Oral Once  . ritonavir  100 mg Oral Q breakfast    Continuous Infusions: . sodium chloride 100 mL/hr at 07/09/13 1641    Past Medical History  Diagnosis Date  . HIV positive   . Hepatitis B     /E-chart  . Microcytic anemia     h/o per E-chart  . 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 Noncompliance with medication regimen     /e-chart  . 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 Pyelonephritis     h/o per E-chart  . Thyroid disease     Past Surgical History  Procedure Laterality Date  . Thyroid surgery    . Thyroid lobectomy  08/2002    left & isthmectomy; for benign thyroid adenoma/E-chart  . Tubal ligation  07/2002    /E-chart    Levon Hedger MS, RD, LDN [phone] Pager [phone] After Hours Pager

## 2013-07-10 DIAGNOSIS — K2210 Ulcer of esophagus without bleeding: Secondary | ICD-10-CM | POA: Diagnosis present

## 2013-07-10 DIAGNOSIS — R1310 Dysphagia, unspecified: Secondary | ICD-10-CM | POA: Diagnosis present

## 2013-07-10 DIAGNOSIS — R634 Abnormal weight loss: Secondary | ICD-10-CM | POA: Diagnosis present

## 2013-07-10 DIAGNOSIS — D509 Iron deficiency anemia, unspecified: Secondary | ICD-10-CM

## 2013-07-10 DIAGNOSIS — E876 Hypokalemia: Secondary | ICD-10-CM

## 2013-07-10 DIAGNOSIS — E43 Unspecified severe protein-calorie malnutrition: Secondary | ICD-10-CM | POA: Diagnosis present

## 2013-07-10 DIAGNOSIS — R1013 Epigastric pain: Secondary | ICD-10-CM

## 2013-07-10 DIAGNOSIS — B1910 Unspecified viral hepatitis B without hepatic coma: Secondary | ICD-10-CM | POA: Diagnosis present

## 2013-07-10 DIAGNOSIS — B20 Human immunodeficiency virus [HIV] disease: Principal | ICD-10-CM

## 2013-07-10 DIAGNOSIS — Z21 Asymptomatic human immunodeficiency virus [HIV] infection status: Secondary | ICD-10-CM

## 2013-07-10 DIAGNOSIS — R748 Abnormal levels of other serum enzymes: Secondary | ICD-10-CM | POA: Diagnosis present

## 2013-07-10 DIAGNOSIS — D34 Benign neoplasm of thyroid gland: Secondary | ICD-10-CM | POA: Diagnosis not present

## 2013-07-10 LAB — COMPREHENSIVE METABOLIC PANEL
AST: 34 U/L (ref 0–37)
Albumin: 2.8 g/dL — ABNORMAL LOW (ref 3.5–5.2)
BUN: 8 mg/dL (ref 6–23)
Calcium: 8.8 mg/dL (ref 8.4–10.5)
Chloride: 104 mEq/L (ref 96–112)
Creatinine, Ser: 0.44 mg/dL — ABNORMAL LOW (ref 0.50–1.10)
Total Protein: 6.4 g/dL (ref 6.0–8.3)

## 2013-07-10 LAB — CBC
HCT: 26.7 % — ABNORMAL LOW (ref 36.0–46.0)
Hemoglobin: 8.8 g/dL — ABNORMAL LOW (ref 12.0–15.0)
MCH: 22.5 pg — ABNORMAL LOW (ref 26.0–34.0)
MCV: 68.3 fL — ABNORMAL LOW (ref 78.0–100.0)
Platelets: 176 10*3/uL (ref 150–400)
RBC: 3.91 MIL/uL (ref 3.87–5.11)
WBC: 3 10*3/uL — ABNORMAL LOW (ref 4.0–10.5)

## 2013-07-10 LAB — IRON AND TIBC
Iron: 22 ug/dL — ABNORMAL LOW (ref 42–135)
Saturation Ratios: 10 % — ABNORMAL LOW (ref 20–55)
TIBC: 211 ug/dL — ABNORMAL LOW (ref 250–470)

## 2013-07-10 LAB — VITAMIN B12: Vitamin B-12: 852 pg/mL (ref 211–911)

## 2013-07-10 LAB — RETICULOCYTES: Retic Ct Pct: 0.7 % (ref 0.4–3.1)

## 2013-07-10 LAB — FOLATE: Folate: 11.3 ng/mL

## 2013-07-10 LAB — MAGNESIUM: Magnesium: 1.8 mg/dL (ref 1.5–2.5)

## 2013-07-10 MED ORDER — FERROUS SULFATE 75 (15 FE) MG/ML PO SOLN
15.0000 mg | Freq: Three times a day (TID) | ORAL | Status: DC
  Administered 2013-07-10 – 2013-07-12 (×5): 15 mg via ORAL
  Filled 2013-07-10 (×9): qty 1

## 2013-07-10 MED ORDER — POTASSIUM CHLORIDE 20 MEQ/15ML (10%) PO LIQD
40.0000 meq | Freq: Two times a day (BID) | ORAL | Status: AC
  Administered 2013-07-10 (×2): 40 meq via ORAL
  Filled 2013-07-10 (×3): qty 30

## 2013-07-10 MED ORDER — AZITHROMYCIN 500 MG PO TABS
500.0000 mg | ORAL_TABLET | Freq: Every day | ORAL | Status: DC
Start: 2013-07-10 — End: 2013-07-10
  Filled 2013-07-10: qty 1

## 2013-07-10 MED ORDER — FLUCONAZOLE 100MG IVPB
100.0000 mg | INTRAVENOUS | Status: DC
  Administered 2013-07-11 – 2013-07-14 (×3): 100 mg via INTRAVENOUS
  Filled 2013-07-10 (×5): qty 50

## 2013-07-10 MED ORDER — ATOVAQUONE 750 MG/5ML PO SUSP
1500.0000 mg | Freq: Every day | ORAL | Status: DC
  Administered 2013-07-10 – 2013-07-15 (×6): 1500 mg via ORAL
  Filled 2013-07-10 (×8): qty 10

## 2013-07-10 MED ORDER — FLUCONAZOLE IN SODIUM CHLORIDE 200-0.9 MG/100ML-% IV SOLN
200.0000 mg | Freq: Once | INTRAVENOUS | Status: AC
  Administered 2013-07-10: 200 mg via INTRAVENOUS
  Filled 2013-07-10 (×2): qty 100

## 2013-07-10 MED ORDER — AZITHROMYCIN 200 MG/5ML PO SUSR
1200.0000 mg | ORAL | Status: DC
  Administered 2013-07-10: 1200 mg via ORAL
  Filled 2013-07-10: qty 30

## 2013-07-10 NOTE — Consult Note (Signed)
[facility]    Date of Admission:  07/09/2013          Reason for Consult: Odynophagia, dysphagia and abdominal pain in the setting of advanced, untreated HIV infection    Referring Physician: Dr. Rosine Beat  Principal Problem:   Abdominal pain, epigastric Active Problems:   HIV INFECTION   Unintentional weight loss   Dysphagia   Odynophagia   Microcytic anemia   Bipolar disorder   Hypokalemia   Protein-calorie malnutrition, severe   Follicular adenoma of thyroid gland   Hepatitis B   Elevated lipase   Elevated alkaline phosphatase level   . atovaquone  1,500 mg Oral Q breakfast  . azithromycin  1,200 mg Oral Weekly  . benztropine  2 mg Oral Daily  . darunavir  800 mg Oral Q breakfast  . emtricitabine-tenofovir  1 tablet Oral Q breakfast  . enoxaparin (LOVENOX) injection  40 mg Subcutaneous Q24H  . ferrous sulfate  15 mg of iron Oral TID  . [START ON 07/11/2013] fluconazole (DIFLUCAN) IV  100 mg Intravenous Q24H  . fluconazole (DIFLUCAN) IV  200 mg Intravenous Once  . multivitamin with minerals  1 tablet Oral Daily  . nystatin  400,000 Units Oral QID  . OLANZapine zydis  20 mg Oral Daily  . pantoprazole  40 mg Oral Daily  . potassium chloride  40 mEq Oral BID  . ritonavir  100 mg Oral Q breakfast    Recommendations: 1. Agree with switch to IV fluconazole 2. Agree with addition of oral Atovaquone and azithromycin prophylaxis 3. Continue current antiretroviral regimen 4. Check repeat CD4 count, HIV viral load and genotype resistance test   Assessment: I do not see any thrush currently but it has been reported recently and the most likely explanation for her dysphagia and odynophagia is candidal esophagitis. I agree with starting IV fluconazole. If she does not improve over the next 2 days she may need upper endoscopy to look for other causes such as viral infection and/or reflux esophagitis and stricture. She has a mild  elevation of lipase and alkaline phosphatase and sludge seen on her abdominal ultrasound. Her pancreas appears normal on the ultrasound and I am less suspicious that her pain is coming from a biliary source. I am quite certain that her recent weight loss is due to the combination of her swallowing difficulties and advanced HIV infection. Hopefully she will agree to reenter care in our clinic.   HPI: [name] is a 37 y.o. female with HIV infection who I followed intermittently for many years. She's had a great deal of difficulty adhering with clinic visits and medication. I last saw her in May of last year at which time she agreed to restart Truvada, Prezista and Norvir. She did not followup for return blood work and did not respond to calls to reschedule appointments. She tells me that she took her medication until May of this year but then stopped because she was having increasing difficulty swallowing her pills. She has been losing weight unintentionally and over the last 4-6 weeks has been having increasing epigastric pain, difficulty swallowing and pain on swallowing. She has been noted to have thrush recently.   Review of Systems: Constitutional: positive for anorexia, malaise and weight loss, negative for chills and fevers Eyes: negative Ears, nose, mouth, throat, and face: negative Respiratory: negative Cardiovascular: negative Gastrointestinal: positive for abdominal pain, dysphagia, odynophagia and reflux symptoms, negative  for change in bowel habits, constipation, diarrhea, nausea and vomiting Genitourinary:negative Integument/breast: negative  Past Medical History  Diagnosis Date  . HIV positive   . Hepatitis B     /E-chart  . Microcytic anemia     h/o per E-chart  . 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 Noncompliance with medication regimen     /e-chart  . 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  Pyelonephritis     h/o per E-chart  . Thyroid disease     History  Substance Use Topics  . Smoking status: Never Smoker   . Smokeless tobacco: Never Used  . Alcohol Use: No    History reviewed. No pertinent family history. Allergies  Allergen Reactions  . Bactrim Itching  . Dapsone Itching  . Orange Fruit Itching  . Peanut-Containing Drug Products Hives  . Shellfish Allergy Hives    OBJECTIVE: Blood pressure 119/76, pulse 63, temperature 97.9 F (36.6 C), temperature source Oral, resp. rate 16, height 6' (1.829 m), weight 56.9 kg (125 lb 7.1 oz), last menstrual period 06/24/2013, SpO2 100.00%. General: She is thin and is clearly lost weight Skin: No rash Oral: No thrush or other lesions noted currently Lungs: Clear Cor: Regular S1 and S2 no murmurs Abdomen: Upper quadrant tenderness bilaterally. No liver, spleen or masses palpated. She is quiet bowel sounds Neuro: Alert with normal speech and conversation Joints and extremities: No acute abnormalities Mood and affect: Normal  Lab Results  Component Value Date   WBC 3.0* 07/10/2013   HGB 8.8* 07/10/2013   HCT 26.7* 07/10/2013   MCV 68.3* 07/10/2013   PLT 176 07/10/2013   BMET    Component Value Date/Time   NA 137 07/10/2013 0430   K 3.1* 07/10/2013 0430   CL 104 07/10/2013 0430   CO2 21 07/10/2013 0430   GLUCOSE 75 07/10/2013 0430   GLUCOSE 93 06/11/2010   BUN 8 07/10/2013 0430   CREATININE 0.44* 07/10/2013 0430   CREATININE 0.68 07/28/2012 1234   CALCIUM 8.8 07/10/2013 0430   GFRNONAA >90 07/10/2013 0430   GFRAA >90 07/10/2013 0430   Lab Results  Component Value Date   ALT 17 07/10/2013   AST 34 07/10/2013   ALKPHOS 122* 07/10/2013   BILITOT 0.5 07/10/2013   HIV 1 RNA Quant (copies/mL)  Date Value  07/28/2012 161096*  03/23/2012 3824*  01/30/2012 045409*     CD4 T Cell Abs (cmm)  Date Value  07/28/2012 <10*  03/23/2012 <10*  01/30/2012 <10*   Microbiology: Recent Results (from the past 240 hour(s))  URINE CULTURE      Status: None   Collection Time    07/04/13  4:40 PM      Result Value Range Status   Specimen Description URINE, CLEAN CATCH   Final   Special Requests NONE   Final   Culture  Setup Time 07/04/2013 21:27   Final   Colony Count NO GROWTH   Final   Culture NO GROWTH   Final   Report Status 07/06/2013 FINAL   Final    Cliffton Asters, MD [facility] [phone] pager   [phone] cell 07/10/2013, 1:26 PM

## 2013-07-10 NOTE — Progress Notes (Signed)
  Subjective: She says that the abdominal pain feels better.  Her main complaint is her throat.  Objective: Vital signs in last 24 hours: Temp:  [97.4 F (36.3 C)-97.9 F (36.6 C)] 97.9 F (36.6 C) (07/26 0445) Pulse Rate:  [48-88] 63 (07/26 0445) Resp:  [13-20] 16 (07/26 0445) BP: (98-130)/(65-80) 119/76 mmHg (07/26 0445) SpO2:  [97 %-100 %] 100 % (07/25 1556) Weight:  [125 lb 7.1 oz (56.9 kg)] 125 lb 7.1 oz (56.9 kg) (07/25 1556) Last BM Date: 07/09/13  Intake/Output from previous day: 07/25 0701 - 07/26 0700 In: 2051.7 [I.V.:1426.7; Blood:625] Out: 0  Intake/Output this shift:    General appearance: alert, cooperative and no distress GI: soft, no significant tenderness to my exam today, ND, no Murphy's, no peritoneal signs  Lab Results:   Recent Labs  07/09/13 1200 07/10/13 0430  WBC 2.6* 3.0*  HGB 7.2* 8.8*  HCT 23.1* 26.7*  PLT 226 176   BMET  Recent Labs  07/09/13 1200 07/10/13 0430  NA 138 137  K 3.4* 3.1*  CL 101 104  CO2 23 21  GLUCOSE 88 75  BUN 10 8  CREATININE 0.46* 0.44*  CALCIUM 9.7 8.8   PT/INR No results found for this basename: LABPROT, INR,  in the last 72 hours ABG No results found for this basename: PHART, PCO2, PO2, HCO3,  in the last 72 hours  Studies/Results: US Abdomen Complete  07/09/2013   *RADIOLOGY REPORT*  Abdominal ultrasound  History:  Upper abdominal pain  Comparison:  July 04, 2013  Findings:  Gallbladder is visualized in multiple projections. Sludge is again noted in the gallbladder, unchanged.  There are no echogenic foci in the gallbladder which move and shadow as is expected with gallstones.  There is no gallbladder wall thickening or pericholecystic fluid collection.  There is no intrahepatic, common hepatic, or common bile duct dilatation.  Pancreas appears normal.  No focal liver lesions are identified.  There are small calcified granulomas in the spleen.  Spleen otherwise appears normal. Kidneys bilaterally appear  normal.  There is no ascites.  Aorta is nonaneurysmal.  Inferior vena cava appears normal.  Conclusion:  Sludge in gallbladder but no gallstones.  This appearance is stable compared to recent prior study.  There are small splenic granulomas.  Study otherwise within normal limits.   Original Report Authenticated By: Bretta Bang, M.D.    Anti-infectives: Anti-infectives   Start     Dose/Rate Route Frequency Ordered Stop   07/09/13 1800  Darunavir Ethanolate (PREZISTA) tablet 800 mg     800 mg Oral Daily with breakfast 07/09/13 1631     07/09/13 1800  ritonavir (NORVIR) capsule 100 mg     100 mg Oral Daily with breakfast 07/09/13 1631     07/09/13 1800  emtricitabine-tenofovir (TRUVADA) 200-300 MG per tablet 1 tablet     1 tablet Oral Daily with breakfast 07/09/13 1631        Assessment/Plan: s/p * No surgery found * She says that her abdominal pain is improved.  main complaint is her throat.  Recommend treatment of her sore throat and esophagitis and possible endoscopy.  she has many potential causes for her symptoms and she seems to be improving.  This certainly could be gallbladder related but I dont think that we have enough evidence at this point to justify cholecystectomy.    LOS: 1 day    Lodema Pilot DAVID 07/10/2013

## 2013-07-10 NOTE — Progress Notes (Signed)
[facility] PROGRESS NOTE  Assessment/Plan: Abdominal pain, epigastric/retrostenal: - last CD4 count < 10, recheck. Pain is most likely esophageal thrush - has not followed with HIV clinic. - start diflucan IV. - consult GI if no improvement. After several day of treatment. Also in the DDx CMV esophagitis.  Hypokalemia -  Replete check a mag.  Bipolar disorder - stable.    HIV INFECTION - cont home meds.  - check an HIV. - start azithromycin and atovaquone.   Microcytic anemia - menstruating female. - start ferrous sulfate check ferritin.    Code Status: full Family Communication: none  Disposition Plan: Inpatient   Consultants:  surgery  Procedures:  none  Antibiotics:  azithro  Atovaquone  truvada  preszista      HPI/Subjective: complaing difficulty swallowing  Objective: Filed Vitals:   07/10/13 0135 07/10/13 0235 07/10/13 0335 07/10/13 0445  BP: 102/71 123/80 112/75 119/76  Pulse: 48 58 53 63  Temp: 97.4 F (36.3 C) 97.9 F (36.6 C) 97.7 F (36.5 C) 97.9 F (36.6 C)  TempSrc: Oral Oral Oral Oral  Resp: 16 16 16 16   Height:      Weight:      SpO2:        Intake/Output Summary (Last 24 hours) at 07/10/13 1035 Last data filed at 07/10/13 0657  Gross per 24 hour  Intake 2051.67 ml  Output      0 ml  Net 2051.67 ml   Filed Weights   07/09/13 1556  Weight: 56.9 kg (125 lb 7.1 oz)    Exam:  General: Alert, awake, oriented x3, in no acute distress.  HEENT: No bruits, no goiter.  Heart: Regular rate and rhythm, without murmurs, rubs, gallops.  Lungs: Good air movement, bilateral air movement.  Abdomen: Soft, nontender, nondistended, positive bowel sounds.  Neuro: Grossly intact, nonfocal.   Data Reviewed: Basic Metabolic Panel:  Recent Labs Lab 07/04/13 1415 07/09/13 1200 07/10/13 0430  NA 137 138 137  K 3.3* 3.4* 3.1*  CL 98 101 104  CO2 23 23 21   GLUCOSE 105* 88 75  BUN 11 10 8   CREATININE 0.59 0.46*  0.44*  CALCIUM 9.8 9.7 8.8   Liver Function Tests:  Recent Labs Lab 07/04/13 1415 07/09/13 1200 07/10/13 0430  AST 56* 37 34  ALT 37* 20 17  ALKPHOS 178* 145* 122*  BILITOT 0.5 0.5 0.5  PROT 8.4* 7.5 6.4  ALBUMIN 3.8 3.4* 2.8*    Recent Labs Lab 07/04/13 1415 07/09/13 1200 07/10/13 0430  LIPASE 80* 92* 84*   No results found for this basename: AMMONIA,  in the last 168 hours CBC:  Recent Labs Lab 07/04/13 1415 07/09/13 1200 07/10/13 0430  WBC 3.8* 2.6* 3.0*  NEUTROABS 3.1  --   --   HGB 8.1* 7.2* 8.8*  HCT 26.0* 23.1* 26.7*  MCV 62.4* 62.3* 68.3*  PLT 255 226 176   Cardiac Enzymes:  Recent Labs Lab 07/04/13 1415  TROPONINI <0.30   BNP (last 3 results) No results found for this basename: PROBNP,  in the last 8760 hours CBG: No results found for this basename: GLUCAP,  in the last 168 hours  Recent Results (from the past 240 hour(s))  URINE CULTURE     Status: None   Collection Time    07/04/13  4:40 PM      Result Value Range Status   Specimen Description URINE, CLEAN CATCH   Final   Special Requests NONE   Final   Culture  Setup Time 07/04/2013 21:27   Final   Colony Count NO GROWTH   Final   Culture NO GROWTH   Final   Report Status 07/06/2013 FINAL   Final     Studies: US Abdomen Complete  07/09/2013   *RADIOLOGY REPORT*  Abdominal ultrasound  History:  Upper abdominal pain  Comparison:  July 04, 2013  Findings:  Gallbladder is visualized in multiple projections. Sludge is again noted in the gallbladder, unchanged.  There are no echogenic foci in the gallbladder which move and shadow as is expected with gallstones.  There is no gallbladder wall thickening or pericholecystic fluid collection.  There is no intrahepatic, common hepatic, or common bile duct dilatation.  Pancreas appears normal.  No focal liver lesions are identified.  There are small calcified granulomas in the spleen.  Spleen otherwise appears normal. Kidneys bilaterally appear  normal.  There is no ascites.  Aorta is nonaneurysmal.  Inferior vena cava appears normal.  Conclusion:  Sludge in gallbladder but no gallstones.  This appearance is stable compared to recent prior study.  There are small splenic granulomas.  Study otherwise within normal limits.   Original Report Authenticated By: Bretta Bang, M.D.    Scheduled Meds: . benztropine  2 mg Oral Daily  . darunavir  800 mg Oral Q breakfast  . emtricitabine-tenofovir  1 tablet Oral Q breakfast  . enoxaparin (LOVENOX) injection  40 mg Subcutaneous Q24H  . iron polysaccharides  150 mg Oral BID  . multivitamin with minerals  1 tablet Oral Daily  . nystatin  400,000 Units Oral QID  . OLANZapine zydis  20 mg Oral Daily  . pantoprazole  40 mg Oral Daily  . potassium chloride  40 mEq Oral Once  . ritonavir  100 mg Oral Q breakfast   Continuous Infusions: . sodium chloride 100 mL/hr at 07/10/13 0657     Marinda Elk  [facility] Pager [phone]. If 8PM-8AM, please contact night-coverage at [email or web address], password [facility] 07/10/2013, 10:35 AM  LOS: 1 day

## 2013-07-11 DIAGNOSIS — K209 Esophagitis, unspecified without bleeding: Secondary | ICD-10-CM

## 2013-07-11 DIAGNOSIS — R1310 Dysphagia, unspecified: Secondary | ICD-10-CM

## 2013-07-11 MED ORDER — LIDOCAINE VISCOUS 2 % MT SOLN
15.0000 mL | OROMUCOSAL | Status: DC | PRN
  Administered 2013-07-11: 15 mL via OROMUCOSAL
  Filled 2013-07-11 (×2): qty 15

## 2013-07-11 MED ORDER — POTASSIUM CHLORIDE 20 MEQ/15ML (10%) PO LIQD
40.0000 meq | Freq: Two times a day (BID) | ORAL | Status: AC
  Administered 2013-07-11 (×2): 40 meq via ORAL
  Filled 2013-07-11 (×2): qty 30

## 2013-07-11 NOTE — Progress Notes (Signed)
Subjective: She has pain with swallowing.  Her discomfort is mostly retrosternal  Objective: Vital signs in last 24 hours: Temp:  [97.4 F (36.3 C)-97.9 F (36.6 C)] 97.4 F (36.3 C) (07/26 2230) Pulse Rate:  [54-61] 61 (07/26 2230) Resp:  [16] 16 (07/26 2230) BP: (111-149)/(70-80) 149/80 mmHg (07/26 2230) SpO2:  [100 %] 100 % (07/26 2230) Last BM Date: 07/09/13  Intake/Output from previous day: 07/26 0701 - 07/27 0700 In: 2205 [I.V.:2205] Out: -  Intake/Output this shift:    General appearance: alert, cooperative and no distress GI: soft, no significant abdominal tenderness, ND, no Murphy's or peritoneal signs  Lab Results:   Recent Labs  07/09/13 1200 07/10/13 0430  WBC 2.6* 3.0*  HGB 7.2* 8.8*  HCT 23.1* 26.7*  PLT 226 176   BMET  Recent Labs  07/09/13 1200 07/10/13 0430  NA 138 137  K 3.4* 3.1*  CL 101 104  CO2 23 21  GLUCOSE 88 75  BUN 10 8  CREATININE 0.46* 0.44*  CALCIUM 9.7 8.8   PT/INR No results found for this basename: LABPROT, INR,  in the last 72 hours ABG No results found for this basename: PHART, PCO2, PO2, HCO3,  in the last 72 hours  Studies/Results: US Abdomen Complete  07/09/2013   *RADIOLOGY REPORT*  Abdominal ultrasound  History:  Upper abdominal pain  Comparison:  July 04, 2013  Findings:  Gallbladder is visualized in multiple projections. Sludge is again noted in the gallbladder, unchanged.  There are no echogenic foci in the gallbladder which move and shadow as is expected with gallstones.  There is no gallbladder wall thickening or pericholecystic fluid collection.  There is no intrahepatic, common hepatic, or common bile duct dilatation.  Pancreas appears normal.  No focal liver lesions are identified.  There are small calcified granulomas in the spleen.  Spleen otherwise appears normal. Kidneys bilaterally appear normal.  There is no ascites.  Aorta is nonaneurysmal.  Inferior vena cava appears normal.  Conclusion:  Sludge in  gallbladder but no gallstones.  This appearance is stable compared to recent prior study.  There are small splenic granulomas.  Study otherwise within normal limits.   Original Report Authenticated By: Bretta Bang, M.D.    Anti-infectives: Anti-infectives   Start     Dose/Rate Route Frequency Ordered Stop   07/11/13 1000  fluconazole (DIFLUCAN) IVPB 100 mg     100 mg 50 mL/hr over 60 Minutes Intravenous Every 24 hours 07/10/13 1109     07/10/13 1600  azithromycin (ZITHROMAX) 200 MG/5ML suspension 1,200 mg     1,200 mg Oral Weekly 07/10/13 1200     07/10/13 1230  atovaquone (MEPRON) 750 MG/5ML suspension 1,500 mg     1,500 mg Oral Daily with breakfast 07/10/13 1124     07/10/13 1200  fluconazole (DIFLUCAN) IVPB 200 mg     200 mg 100 mL/hr over 60 Minutes Intravenous  Once 07/10/13 1109 07/10/13 1520   07/10/13 1200  azithromycin (ZITHROMAX) tablet 500 mg  Status:  Discontinued     500 mg Oral Daily 07/10/13 1124 07/10/13 1200   07/09/13 1800  Darunavir Ethanolate (PREZISTA) tablet 800 mg     800 mg Oral Daily with breakfast 07/09/13 1631     07/09/13 1800  ritonavir (NORVIR) capsule 100 mg     100 mg Oral Daily with breakfast 07/09/13 1631     07/09/13 1800  emtricitabine-tenofovir (TRUVADA) 200-300 MG per tablet 1 tablet     1 tablet Oral  Daily with breakfast 07/09/13 1631        Assessment/Plan: s/p * No surgery found * Not much abdominal pain, her biggest complaint is pain with swallowing.  If no improvement with treatment of esophagitis, then I agree with EGD.    LOS: 2 days    Lodema Pilot DAVID 07/11/2013

## 2013-07-11 NOTE — Progress Notes (Signed)
Patient ID: [name], female   DOB: 1976/01/04, 37 y.o.   MRN: [id number]         [facility]    Date of Admission:  07/09/2013           Day 2 fluconazole  Principal Problem:   Abdominal pain, epigastric Active Problems:   HIV INFECTION   Unintentional weight loss   Dysphagia   Odynophagia   Microcytic anemia   Bipolar disorder   Hypokalemia   Protein-calorie malnutrition, severe   Follicular adenoma of thyroid gland   Hepatitis B   Elevated lipase   Elevated alkaline phosphatase level   . atovaquone  1,500 mg Oral Q breakfast  . azithromycin  1,200 mg Oral Weekly  . benztropine  2 mg Oral Daily  . darunavir  800 mg Oral Q breakfast  . emtricitabine-tenofovir  1 tablet Oral Q breakfast  . enoxaparin (LOVENOX) injection  40 mg Subcutaneous Q24H  . ferrous sulfate  15 mg of iron Oral TID  . fluconazole (DIFLUCAN) IV  100 mg Intravenous Q24H  . multivitamin with minerals  1 tablet Oral Daily  . nystatin  400,000 Units Oral QID  . OLANZapine zydis  20 mg Oral Daily  . pantoprazole  40 mg Oral Daily  . potassium chloride  40 mEq Oral BID  . ritonavir  100 mg Oral Q breakfast    Subjective: She says her odynophagia is worse this morning and she is unable to eat.  Objective: Temp:  [97.4 F (36.3 C)-97.9 F (36.6 C)] 97.4 F (36.3 C) (07/26 2230) Pulse Rate:  [54-61] 61 (07/26 2230) Resp:  [16] 16 (07/26 2230) BP: (111-149)/(70-80) 149/80 mmHg (07/26 2230) SpO2:  [100 %] 100 % (07/26 2230)  General: She appears uncomfortable laying on her side in bed Skin: No rash Lungs:  clear Cor: Regular S1 and S2 no murmurs Abdomen: Mild epigastric tenderness   Lab Results Lab Results  Component Value Date   WBC 3.0* 07/10/2013   HGB 8.8* 07/10/2013   HCT 26.7* 07/10/2013   MCV 68.3* 07/10/2013   PLT 176 07/10/2013    Lab Results  Component Value Date   CREATININE 0.44* 07/10/2013   BUN 8 07/10/2013   NA 137 07/10/2013   K 3.1*  07/10/2013   CL 104 07/10/2013   CO2 21 07/10/2013    Lab Results  Component Value Date   ALT 17 07/10/2013   AST 34 07/10/2013   ALKPHOS 122* 07/10/2013   BILITOT 0.5 07/10/2013    HIV 1 RNA Quant (copies/mL)  Date Value  07/28/2012 [id number]*  03/23/2012 3824*  01/30/2012 [id number]*     CD4 T Cell Abs (cmm)  Date Value  07/28/2012 <10*  03/23/2012 <10*  01/30/2012 <10*    Assessment: If she is no better within the next 24 hours I would agree with GI evaluation for upper endoscopy looking for causes of esophagitis other than Candida.  Plan: 1. Continue fluconazole  Cliffton Asters, MD [facility] [phone] pager   [phone] cell 07/11/2013, 12:48 PM

## 2013-07-11 NOTE — Progress Notes (Signed)
[facility] PROGRESS NOTE  Assessment/Plan: Abdominal pain, epigastric/retrostenal: - last CD4 count < 10, recheck. Pain is not improved. Try lidocaine gel - has not followed with HIV clinic. - start diflucan IV. - consult GI if no improvement. After several day of treatment. Also in the DDx CMV, HSVesophagitis.  Hypokalemia -  Replete check a mag.  Bipolar disorder - stable.    HIV INFECTION - cont home meds.  - check an HIV. - start azithromycin and atovaquone. - appreciate ID assistance.   Microcytic anemia - menstruating female. - start ferrous sulfate check ferritin.    Code Status: full Family Communication: none  Disposition Plan: Inpatient   Consultants:  surgery  Procedures:  none  Antibiotics:  azithro  Atovaquone  truvada  preszista      HPI/Subjective: unchanged  Objective: Filed Vitals:   07/10/13 0335 07/10/13 0445 07/10/13 1400 07/10/13 2230  BP: 112/75 119/76 111/70 149/80  Pulse: 53 63 54 61  Temp: 97.7 F (36.5 C) 97.9 F (36.6 C) 97.9 F (36.6 C) 97.4 F (36.3 C)  TempSrc: Oral Oral Oral Oral  Resp: 16 16 16 16   Height:      Weight:      SpO2:   100% 100%    Intake/Output Summary (Last 24 hours) at 07/11/13 0948 Last data filed at 07/11/13 0600  Gross per 24 hour  Intake   2205 ml  Output      0 ml  Net   2205 ml   Filed Weights   07/09/13 1556  Weight: 56.9 kg (125 lb 7.1 oz)    Exam:  General: Alert, awake, oriented x3, in no acute distress. cachectic HEENT: No bruits, no goiter.  Heart: Regular rate and rhythm, without murmurs, rubs, gallops.  Lungs: Good air movement, bilateral air movement.  Abdomen: Soft, nontender, nondistended, positive bowel sounds.  Neuro: Grossly intact, nonfocal.   Data Reviewed: Basic Metabolic Panel:  Recent Labs Lab 07/04/13 1415 07/09/13 1200 07/10/13 0430 07/10/13 1109  NA 137 138 137  --   K 3.3* 3.4* 3.1*  --   CL 98 101 104  --   CO2 23 23 21   --    GLUCOSE 105* 88 75  --   BUN 11 10 8   --   CREATININE 0.59 0.46* 0.44*  --   CALCIUM 9.8 9.7 8.8  --   MG  --   --   --  1.8   Liver Function Tests:  Recent Labs Lab 07/04/13 1415 07/09/13 1200 07/10/13 0430  AST 56* 37 34  ALT 37* 20 17  ALKPHOS 178* 145* 122*  BILITOT 0.5 0.5 0.5  PROT 8.4* 7.5 6.4  ALBUMIN 3.8 3.4* 2.8*    Recent Labs Lab 07/04/13 1415 07/09/13 1200 07/10/13 0430  LIPASE 80* 92* 84*   No results found for this basename: AMMONIA,  in the last 168 hours CBC:  Recent Labs Lab 07/04/13 1415 07/09/13 1200 07/10/13 0430  WBC 3.8* 2.6* 3.0*  NEUTROABS 3.1  --   --   HGB 8.1* 7.2* 8.8*  HCT 26.0* 23.1* 26.7*  MCV 62.4* 62.3* 68.3*  PLT 255 226 176   Cardiac Enzymes:  Recent Labs Lab 07/04/13 1415  TROPONINI <0.30   BNP (last 3 results) No results found for this basename: PROBNP,  in the last 8760 hours CBG: No results found for this basename: GLUCAP,  in the last 168 hours  Recent Results (from the past 240 hour(s))  URINE CULTURE  Status: None   Collection Time    07/04/13  4:40 PM      Result Value Range Status   Specimen Description URINE, CLEAN CATCH   Final   Special Requests NONE   Final   Culture  Setup Time 07/04/2013 21:27   Final   Colony Count NO GROWTH   Final   Culture NO GROWTH   Final   Report Status 07/06/2013 FINAL   Final     Studies: US Abdomen Complete  07/09/2013   *RADIOLOGY REPORT*  Abdominal ultrasound  History:  Upper abdominal pain  Comparison:  July 04, 2013  Findings:  Gallbladder is visualized in multiple projections. Sludge is again noted in the gallbladder, unchanged.  There are no echogenic foci in the gallbladder which move and shadow as is expected with gallstones.  There is no gallbladder wall thickening or pericholecystic fluid collection.  There is no intrahepatic, common hepatic, or common bile duct dilatation.  Pancreas appears normal.  No focal liver lesions are identified.  There are small  calcified granulomas in the spleen.  Spleen otherwise appears normal. Kidneys bilaterally appear normal.  There is no ascites.  Aorta is nonaneurysmal.  Inferior vena cava appears normal.  Conclusion:  Sludge in gallbladder but no gallstones.  This appearance is stable compared to recent prior study.  There are small splenic granulomas.  Study otherwise within normal limits.   Original Report Authenticated By: Bretta Bang, M.D.    Scheduled Meds: . atovaquone  1,500 mg Oral Q breakfast  . azithromycin  1,200 mg Oral Weekly  . benztropine  2 mg Oral Daily  . darunavir  800 mg Oral Q breakfast  . emtricitabine-tenofovir  1 tablet Oral Q breakfast  . enoxaparin (LOVENOX) injection  40 mg Subcutaneous Q24H  . ferrous sulfate  15 mg of iron Oral TID  . fluconazole (DIFLUCAN) IV  100 mg Intravenous Q24H  . multivitamin with minerals  1 tablet Oral Daily  . nystatin  400,000 Units Oral QID  . OLANZapine zydis  20 mg Oral Daily  . pantoprazole  40 mg Oral Daily  . ritonavir  100 mg Oral Q breakfast   Continuous Infusions: . sodium chloride 100 mL/hr at 07/11/13 0200     Marinda Elk  [facility] Pager [phone]. If 8PM-8AM, please contact night-coverage at [email or web address], password [facility] 07/11/2013, 9:48 AM  LOS: 2 days

## 2013-07-11 NOTE — Progress Notes (Signed)
Brief Nutrition Note  Patient at risk for refeeding syndrome due to prolonged poor PO intake and severe weight loss.   Diet advanced today to Clear Liquids. Patient has had one tray with minimal intake. She complains of pain with swallowing.   Potassium is low, plan to replete, magnesium WNL  INTERVENTION:  - Advance diet as tolerated per MD. Recommend soft foods for ease of swallowing.  - Recommend MD monitor pt's potassium, magnesium, and phosphorus (and replete as needed) daily for at least 3 days once diet started to monitor for refeeding syndrome  Linnell Fulling, RD, LDN Pager #: [phone] After-Hours Pager #: [phone]

## 2013-07-12 ENCOUNTER — Inpatient Hospital Stay (HOSPITAL_COMMUNITY): Payer: Medicaid Other

## 2013-07-12 DIAGNOSIS — E876 Hypokalemia: Secondary | ICD-10-CM

## 2013-07-12 LAB — BASIC METABOLIC PANEL
Chloride: 105 mEq/L (ref 96–112)
GFR calc Af Amer: >90 mL/min (ref >90)
GFR calc non Af Amer: >90 mL/min (ref >90)
Potassium: 3 mEq/L — ABNORMAL LOW (ref 3.5–5.1)
Sodium: 137 mEq/L (ref 135–145)

## 2013-07-12 LAB — TYPE AND SCREEN
ABO/RH(D): A POS
Unit division: 0

## 2013-07-12 LAB — T-HELPER CELLS (CD4) COUNT (NOT AT ARMC)
CD4 % Helper T Cell: 2 % — ABNORMAL LOW (ref 33–55)
CD4 T Cell Abs: <10 uL — ABNORMAL LOW (ref 400–2700)

## 2013-07-12 LAB — HIV-1 RNA ULTRAQUANT REFLEX TO GENTYP+: HIV 1 RNA Quant: 348927 copies/mL — ABNORMAL HIGH (ref <20)

## 2013-07-12 MED ORDER — POTASSIUM CHLORIDE CRYS ER 20 MEQ PO TBCR
40.0000 meq | EXTENDED_RELEASE_TABLET | Freq: Three times a day (TID) | ORAL | Status: AC
  Administered 2013-07-13 (×2): 40 meq via ORAL
  Filled 2013-07-12 (×7): qty 2

## 2013-07-12 MED ORDER — TECHNETIUM TC 99M MEBROFENIN IV KIT
5.4000 | PACK | Freq: Once | INTRAVENOUS | Status: AC | PRN
  Administered 2013-07-12: 5 via INTRAVENOUS

## 2013-07-12 MED ORDER — POTASSIUM CHLORIDE 20 MEQ/15ML (10%) PO LIQD
40.0000 meq | Freq: Three times a day (TID) | ORAL | Status: DC
  Filled 2013-07-12 (×3): qty 30

## 2013-07-12 MED ORDER — POTASSIUM CHLORIDE 10 MEQ/100ML IV SOLN
10.0000 meq | INTRAVENOUS | Status: AC
  Administered 2013-07-12 (×5): 10 meq via INTRAVENOUS
  Filled 2013-07-12 (×6): qty 100

## 2013-07-12 NOTE — Progress Notes (Signed)
General surgery attending note:  I have interviewed and examined this patient. I reviewed her workup and Hospital course to date.  She complains of upper abdominal pain. On examination her upper abdomen is soft but subjectively tender in the epigastrium and right upper quadrant. No guarding. No mass.  Assess/Plan: Abdominal pain and sludge in gallbladder on ultrasound, chronic. It is completely unclear whether she is having pain from biliary tract origin.  I have taken the liberty of ordering a hepatobiliary scan.  I recommend that she undergo evaluation by gastroenterology for endoscopy due to her pain with swallowing and epigastric nature pain.  No indication for surgical intervention at this point. We'll continue to follow her and participate in workup.  I have taken the liberty of adding potassium to her IV to treat her hyperkalemia.   Angelia Mould. Derrell Lolling, M.D., [facility] General and Minimally invasive Surgery Breast and Colorectal Surgery Office:   [phone] Pager:   [phone]

## 2013-07-12 NOTE — Progress Notes (Signed)
[facility] PROGRESS NOTE  Assessment/Plan: Abdominal pain, epigastric/retrostenal: - last CD4 count < 10, recheck. Pain is not improved. Try lidocaine gel - has not followed with HIV clinic. - 7.27.2014 started  IV Diflucan. Lidocaine not helping. - consult GI for possible EGD.  Hypokalemia -  Replete check a mag.  Bipolar disorder - stable.    HIV INFECTION - cont home meds.  - check an HIV. - start azithromycin and atovaquone. - appreciate ID assistance.   Microcytic anemia - menstruating female. - start ferrous sulfate check ferritin.    Code Status: full Family Communication: none  Disposition Plan: Inpatient   Consultants:  surgery  Procedures:  none  Antibiotics:  azithro  Atovaquone  truvada  preszista      HPI/Subjective: unchanged  Objective: Filed Vitals:   07/10/13 2230 07/11/13 1400 07/11/13 2045 07/12/13 0539  BP: 149/80 147/81 139/84 177/77  Pulse: 61 65 61 71  Temp: 97.4 F (36.3 C) 98.5 F (36.9 C) 97.9 F (36.6 C) 98.1 F (36.7 C)  TempSrc: Oral Oral  Oral  Resp: 16 16 16 16   Height:      Weight:      SpO2: 100% 100% 100% 100%    Intake/Output Summary (Last 24 hours) at 07/12/13 0944 Last data filed at 07/12/13 0600  Gross per 24 hour  Intake   2300 ml  Output   1350 ml  Net    950 ml   Filed Weights   07/09/13 1556  Weight: 56.9 kg (125 lb 7.1 oz)    Exam:  General: Alert, awake, oriented x3, in no acute distress. cachectic HEENT: No bruits, no goiter.  Heart: Regular rate and rhythm, without murmurs, rubs, gallops.  Lungs: Good air movement, bilateral air movement.  Abdomen: Soft, nontender, nondistended, positive bowel sounds.  Neuro: Grossly intact, nonfocal.   Data Reviewed: Basic Metabolic Panel:  Recent Labs Lab 07/09/13 1200 07/10/13 0430 07/10/13 1109 07/12/13 0353  NA 138 137  --  137  K 3.4* 3.1*  --  3.0*  CL 101 104  --  105  CO2 23 21  --  22  GLUCOSE 88 75  --  92  BUN  10 8  --  3*  CREATININE 0.46* 0.44*  --  0.52  CALCIUM 9.7 8.8  --  8.3*  MG  --   --  1.8  --    Liver Function Tests:  Recent Labs Lab 07/09/13 1200 07/10/13 0430  AST 37 34  ALT 20 17  ALKPHOS 145* 122*  BILITOT 0.5 0.5  PROT 7.5 6.4  ALBUMIN 3.4* 2.8*    Recent Labs Lab 07/09/13 1200 07/10/13 0430  LIPASE 92* 84*   No results found for this basename: AMMONIA,  in the last 168 hours CBC:  Recent Labs Lab 07/09/13 1200 07/10/13 0430  WBC 2.6* 3.0*  HGB 7.2* 8.8*  HCT 23.1* 26.7*  MCV 62.3* 68.3*  PLT 226 176   Cardiac Enzymes: No results found for this basename: CKTOTAL, CKMB, CKMBINDEX, TROPONINI,  in the last 168 hours BNP (last 3 results) No results found for this basename: PROBNP,  in the last 8760 hours CBG: No results found for this basename: GLUCAP,  in the last 168 hours  Recent Results (from the past 240 hour(s))  URINE CULTURE     Status: None   Collection Time    07/04/13  4:40 PM      Result Value Range Status   Specimen Description URINE, CLEAN  CATCH   Final   Special Requests NONE   Final   Culture  Setup Time 07/04/2013 21:27   Final   Colony Count NO GROWTH   Final   Culture NO GROWTH   Final   Report Status 07/06/2013 FINAL   Final     Studies: No results found.  Scheduled Meds: . atovaquone  1,500 mg Oral Q breakfast  . azithromycin  1,200 mg Oral Weekly  . benztropine  2 mg Oral Daily  . darunavir  800 mg Oral Q breakfast  . emtricitabine-tenofovir  1 tablet Oral Q breakfast  . enoxaparin (LOVENOX) injection  40 mg Subcutaneous Q24H  . ferrous sulfate  15 mg of iron Oral TID  . fluconazole (DIFLUCAN) IV  100 mg Intravenous Q24H  . multivitamin with minerals  1 tablet Oral Daily  . nystatin  400,000 Units Oral QID  . OLANZapine zydis  20 mg Oral Daily  . pantoprazole  40 mg Oral Daily  . ritonavir  100 mg Oral Q breakfast   Continuous Infusions: . sodium chloride 100 mL/hr at 07/12/13 0802     Marinda Elk  [facility] Pager [phone]. If 8PM-8AM, please contact night-coverage at [email or web address], password [facility] 07/12/2013, 9:44 AM  LOS: 3 days

## 2013-07-12 NOTE — ED Provider Notes (Signed)
Medical screening examination/treatment/procedure(s) were performed by non-physician practitioner and as supervising physician I was immediately available for consultation/collaboration.   Gwyneth Sprout, MD 07/12/13 [phone]

## 2013-07-12 NOTE — Progress Notes (Signed)
Patient ID: [name], female   DOB: 1976-06-17, 37 y.o.   MRN: [id number]    Subjective: Had nausea this am and more pain in epigastric and RUQ  Objective: Vital signs in last 24 hours: Temp:  [97.9 F (36.6 C)-98.5 F (36.9 C)] 98.1 F (36.7 C) (07/28 0539) Pulse Rate:  [61-71] 71 (07/28 0539) Resp:  [16] 16 (07/28 0539) BP: (139-177)/(77-84) 177/77 mmHg (07/28 0539) SpO2:  [100 %] 100 % (07/28 0539) Last BM Date: 07/09/13  Intake/Output from previous day: 07/27 0701 - 07/28 0700 In: 2300 [I.V.:2300] Out: 1350 [Urine:1350] Intake/Output this shift:    General appearance: alert, cooperative and no distress GI: soft, very tender in RUQ and epigastric region, +BS  Lab Results:   Recent Labs  07/09/13 1200 07/10/13 0430  WBC 2.6* 3.0*  HGB 7.2* 8.8*  HCT 23.1* 26.7*  PLT 226 176   BMET  Recent Labs  07/10/13 0430 07/12/13 0353  NA 137 137  K 3.1* 3.0*  CL 104 105  CO2 21 22  GLUCOSE 75 92  BUN 8 3*  CREATININE 0.44* 0.52  CALCIUM 8.8 8.3*   PT/INR No results found for this basename: LABPROT, INR,  in the last 72 hours ABG No results found for this basename: PHART, PCO2, PO2, HCO3,  in the last 72 hours  Studies/Results: No results found.  Anti-infectives: Anti-infectives   Start     Dose/Rate Route Frequency Ordered Stop   07/11/13 1000  fluconazole (DIFLUCAN) IVPB 100 mg     100 mg 50 mL/hr over 60 Minutes Intravenous Every 24 hours 07/10/13 1109     07/10/13 1600  azithromycin (ZITHROMAX) 200 MG/5ML suspension 1,200 mg     1,200 mg Oral Weekly 07/10/13 1200     07/10/13 1230  atovaquone (MEPRON) 750 MG/5ML suspension 1,500 mg     1,500 mg Oral Daily with breakfast 07/10/13 1124     07/10/13 1200  fluconazole (DIFLUCAN) IVPB 200 mg     200 mg 100 mL/hr over 60 Minutes Intravenous  Once 07/10/13 1109 07/10/13 1520   07/10/13 1200  azithromycin (ZITHROMAX) tablet 500 mg  Status:  Discontinued     500 mg Oral Daily 07/10/13 1124 07/10/13 1200    07/09/13 1800  Darunavir Ethanolate (PREZISTA) tablet 800 mg     800 mg Oral Daily with breakfast 07/09/13 1631     07/09/13 1800  ritonavir (NORVIR) capsule 100 mg     100 mg Oral Daily with breakfast 07/09/13 1631     07/09/13 1800  emtricitabine-tenofovir (TRUVADA) 200-300 MG per tablet 1 tablet     1 tablet Oral Daily with breakfast 07/09/13 1631        Assessment/Plan: Abdominal pain/gallbladder sludge: she is having some nausea today and her pain is more in the epigastric/RUQ region.  Agree that she likely needs GI eval for endoscopy due to her pain with swallowing but she is now having symptoms more consistent with bilary colic.  Will discuss with Dr. Derrell Lolling but think that a HIDA scan would benefit in her evaluation.    LOS: 3 days    WHITE, ELIZABETH 07/12/2013

## 2013-07-12 NOTE — Consult Note (Signed)
Reason for Consult: Dysphagia, Odynophagia, and Weight loss Referring Physician: [facility]  Yasmen I Hieronymus HPI: This is a 37 year old female with poorly controlled HIV, medical noncompliance, and hepatitis B who is admitted for complaints of dysphagia, odynophagia, and weight loss. She reports the onset of her symptoms this past April and it has progressively worsened.  It is associated with chest pain, nausea, and vomiting.  Despite treatment with fluconazole her symptoms have not improved.  In the past she was noted to have thrush.  The patient's current CD4 count is <10.  Past Medical History  Diagnosis Date  . HIV positive   . Hepatitis B     /E-chart  . Microcytic anemia     h/o per E-chart  . 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 Noncompliance with medication regimen     /e-chart  . 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 Pyelonephritis     h/o per E-chart  . Thyroid disease     Past Surgical History  Procedure Laterality Date  . Thyroid surgery    . Thyroid lobectomy  08/2002    left & isthmectomy; for benign thyroid adenoma/E-chart  . Tubal ligation  07/2002    /E-chart    History reviewed. No pertinent family history.  Social History:  reports that she has never smoked. She has never used smokeless tobacco. She reports that she does not drink alcohol or use illicit drugs.  Allergies:  Allergies  Allergen Reactions  . Bactrim Itching  . Dapsone Itching  . Orange Fruit Itching  . Peanut-Containing Drug Products Hives  . Shellfish Allergy Hives    Medications:  Scheduled: . atovaquone  1,500 mg Oral Q breakfast  . azithromycin  1,200 mg Oral Weekly  . benztropine  2 mg Oral Daily  . darunavir  800 mg Oral Q breakfast  . emtricitabine-tenofovir  1 tablet Oral Q breakfast  . enoxaparin (LOVENOX) injection  40 mg Subcutaneous Q24H  . ferrous sulfate  15 mg of iron Oral TID  .  fluconazole (DIFLUCAN) IV  100 mg Intravenous Q24H  . multivitamin with minerals  1 tablet Oral Daily  . nystatin  400,000 Units Oral QID  . OLANZapine zydis  20 mg Oral Daily  . pantoprazole  40 mg Oral Daily  . potassium chloride  40 mEq Oral TID  . ritonavir  100 mg Oral Q breakfast   Continuous: . sodium chloride 100 mL/hr at 07/12/13 0802    Results for orders placed during the hospital encounter of 07/09/13 (from the past 24 hour(s))  BASIC METABOLIC PANEL     Status: Abnormal   Collection Time    07/12/13  3:53 AM      Result Value Range   Sodium 137  135 - 145 mEq/L   Potassium 3.0 (*) 3.5 - 5.1 mEq/L   Chloride 105  96 - 112 mEq/L   CO2 22  19 - 32 mEq/L   Glucose, Bld 92  70 - 99 mg/dL   BUN 3 (*) 6 - 23 mg/dL   Creatinine, Ser 1.61  0.50 - 1.10 mg/dL   Calcium 8.3 (*) 8.4 - 10.5 mg/dL   GFR calc non Af Amer >90  >90 mL/min   GFR calc Af Amer >90  >90 mL/min     No results found.  ROS:  As stated above in the HPI otherwise negative.  Blood pressure  177/77, pulse 71, temperature 98.1 F (36.7 C), temperature source Oral, resp. rate 16, height 6' (1.829 m), weight 125 lb 7.1 oz (56.9 kg), last menstrual period 06/24/2013, SpO2 100.00%.    PE: Gen: Weak, uncomfortable appearing, Alert and Oriented HEENT:  [place]/AT, EOMI Neck: Supple, no LAD Lungs: CTA Bilaterally CV: RRR without M/G/R ABM: Soft, NTND, +BS Ext: No C/C/E  Assessment/Plan: 1) Odynophagia 2) Dysphagia. 3) Weight loss. 4) HIV. 5) HBV.   Further evaluation with an EGD is required.  He has an extremely low CD4 count.  I will obtain biopsies of the esophagus even if it is grossly normal in appearance.  Plan: 1) EGD tomorrow.  Kanita Delage D 07/12/2013, 1:05 PM

## 2013-07-12 NOTE — Progress Notes (Signed)
INFECTIOUS DISEASE PROGRESS NOTE  ID: [name] is a 37 y.o. female with  Principal Problem:   Abdominal pain, epigastric Active Problems:   HIV INFECTION   Microcytic anemia   Bipolar disorder   Hypokalemia   Protein-calorie malnutrition, severe   Follicular adenoma of thyroid gland   Hepatitis B   Elevated lipase   Elevated alkaline phosphatase level   Unintentional weight loss   Dysphagia   Odynophagia  Subjective: Without complaints. Denies dysphagia. Having trouble with pill size (potassium).   Abtx:  Anti-infectives   Start     Dose/Rate Route Frequency Ordered Stop   07/11/13 1000  fluconazole (DIFLUCAN) IVPB 100 mg     100 mg 50 mL/hr over 60 Minutes Intravenous Every 24 hours 07/10/13 1109     07/10/13 1600  azithromycin (ZITHROMAX) 200 MG/5ML suspension 1,200 mg     1,200 mg Oral Weekly 07/10/13 1200     07/10/13 1230  atovaquone (MEPRON) 750 MG/5ML suspension 1,500 mg     1,500 mg Oral Daily with breakfast 07/10/13 1124     07/10/13 1200  fluconazole (DIFLUCAN) IVPB 200 mg     200 mg 100 mL/hr over 60 Minutes Intravenous  Once 07/10/13 1109 07/10/13 1520   07/10/13 1200  azithromycin (ZITHROMAX) tablet 500 mg  Status:  Discontinued     500 mg Oral Daily 07/10/13 1124 07/10/13 1200   07/09/13 1800  Darunavir Ethanolate (PREZISTA) tablet 800 mg     800 mg Oral Daily with breakfast 07/09/13 1631     07/09/13 1800  ritonavir (NORVIR) capsule 100 mg     100 mg Oral Daily with breakfast 07/09/13 1631     07/09/13 1800  emtricitabine-tenofovir (TRUVADA) 200-300 MG per tablet 1 tablet     1 tablet Oral Daily with breakfast 07/09/13 1631        Medications:  Scheduled: . atovaquone  1,500 mg Oral Q breakfast  . azithromycin  1,200 mg Oral Weekly  . benztropine  2 mg Oral Daily  . darunavir  800 mg Oral Q breakfast  . emtricitabine-tenofovir  1 tablet Oral Q breakfast  . enoxaparin (LOVENOX) injection  40 mg Subcutaneous Q24H  . ferrous sulfate  15 mg  of iron Oral TID  . fluconazole (DIFLUCAN) IV  100 mg Intravenous Q24H  . multivitamin with minerals  1 tablet Oral Daily  . nystatin  400,000 Units Oral QID  . OLANZapine zydis  20 mg Oral Daily  . pantoprazole  40 mg Oral Daily  . potassium chloride  40 mEq Oral TID  . ritonavir  100 mg Oral Q breakfast    Objective: Vital signs in last 24 hours: Temp:  [97.9 F (36.6 C)-98.1 F (36.7 C)] 98.1 F (36.7 C) (07/28 0539) Pulse Rate:  [61-71] 71 (07/28 0539) Resp:  [16] 16 (07/28 0539) BP: (139-177)/(77-84) 177/77 mmHg (07/28 0539) SpO2:  [100 %] 100 % (07/28 0539)   General appearance: alert, cooperative and no distress Throat: normal findings: oropharynx pink & moist without lesions or evidence of thrush Resp: clear to auscultation bilaterally Cardio: regular rate and rhythm GI: normal findings: soft and abnormal findings:  mild diffuse tenderness.   Lab Results  Recent Labs  07/10/13 0430 07/12/13 0353  WBC 3.0*  --   HGB 8.8*  --   HCT 26.7*  --   NA 137 137  K 3.1* 3.0*  CL 104 105  CO2 21 22  BUN 8 3*  CREATININE 0.44* 0.52  Liver Panel  Recent Labs  07/10/13 0430  PROT 6.4  ALBUMIN 2.8*  AST 34  ALT 17  ALKPHOS 122*  BILITOT 0.5   Sedimentation Rate No results found for this basename: ESRSEDRATE,  in the last 72 hours C-Reactive Protein No results found for this basename: CRP,  in the last 72 hours  Microbiology: Recent Results (from the past 240 hour(s))  URINE CULTURE     Status: None   Collection Time    07/04/13  4:40 PM      Result Value Range Status   Specimen Description URINE, CLEAN CATCH   Final   Special Requests NONE   Final   Culture  Setup Time 07/04/2013 21:27   Final   Colony Count NO GROWTH   Final   Culture NO GROWTH   Final   Report Status 07/06/2013 FINAL   Final    Studies/Results: Nm Hepatobiliary  07/12/2013   *RADIOLOGY REPORT*  Clinical Data: Right upper quadrant pain, nausea.  NUCLEAR MEDICINE HEPATOHBILIARY  INCLUDE GB  Radiopharmaceutical:  5.4 mCi technetium 48m MDP IV.  Comparison: Ultrasound 07/09/2013  Findings: Prompt uptake and excretion of radiotracer by the liver. Activity seen within the gallbladder at 10-15 minutes and small bowel at 35 minutes.  IMPRESSION: No evidence of cystic duct or common bile duct obstruction.   Original Report Authenticated By: Charlett Nose, M.D.     Assessment/Plan: AIDS Noncompliance Dysphagia  Scheduled for EGD in AM.  Watch her on anti-retroviral rx Would try to get her CD4 and HIV RNA repeated prior to d/c so we can see if her current regiemen is working while she is in an observed setting.  She will need intensive home help with adherence.   Total days of antibiotics: 3 (diflucan) DRVr/TRV         [name] Infectious Diseases (pager) [phone] [email or web address] 07/12/2013, 4:38 PM  LOS: 3 days

## 2013-07-13 ENCOUNTER — Encounter (HOSPITAL_COMMUNITY): Payer: Self-pay

## 2013-07-13 ENCOUNTER — Encounter (HOSPITAL_COMMUNITY)
Admission: EM | Disposition: A | Discharge status: Home / Self Care | Payer: Self-pay | Source: Home / Self Care | Attending: Internal Medicine

## 2013-07-13 HISTORY — PX: ESOPHAGOGASTRODUODENOSCOPY: SHX5428

## 2013-07-13 LAB — CBC
HCT: 27.3 % — ABNORMAL LOW (ref 36.0–46.0)
Hemoglobin: 9 g/dL — ABNORMAL LOW (ref 12.0–15.0)
MCHC: 33 g/dL (ref 30.0–36.0)
WBC: 3.7 10*3/uL — ABNORMAL LOW (ref 4.0–10.5)

## 2013-07-13 LAB — COMPREHENSIVE METABOLIC PANEL
ALT: 13 U/L (ref 0–35)
Albumin: 2.7 g/dL — ABNORMAL LOW (ref 3.5–5.2)
Alkaline Phosphatase: 106 U/L (ref 39–117)
BUN: 3 mg/dL — ABNORMAL LOW (ref 6–23)
Chloride: 105 mEq/L (ref 96–112)
GFR calc Af Amer: >90 mL/min (ref >90)
Glucose, Bld: 85 mg/dL (ref 70–99)
Potassium: 3.4 mEq/L — ABNORMAL LOW (ref 3.5–5.1)
Sodium: 136 mEq/L (ref 135–145)
Total Bilirubin: 0.4 mg/dL (ref 0.3–1.2)
Total Protein: 5.9 g/dL — ABNORMAL LOW (ref 6.0–8.3)

## 2013-07-13 LAB — LIPASE, BLOOD: Lipase: 47 U/L (ref 11–59)

## 2013-07-13 SURGERY — EGD (ESOPHAGOGASTRODUODENOSCOPY)
Anesthesia: Moderate Sedation

## 2013-07-13 MED ORDER — MIDAZOLAM HCL 10 MG/2ML IJ SOLN
INTRAMUSCULAR | Status: AC
  Filled 2013-07-13: qty 4

## 2013-07-13 MED ORDER — SODIUM CHLORIDE 0.9 % IV SOLN
INTRAVENOUS | Status: DC
  Administered 2013-07-15 – 2013-07-16 (×2): via INTRAVENOUS

## 2013-07-13 MED ORDER — FENTANYL CITRATE 0.05 MG/ML IJ SOLN
INTRAMUSCULAR | Status: DC | PRN
  Administered 2013-07-13 (×3): 25 ug via INTRAVENOUS

## 2013-07-13 MED ORDER — DIPHENHYDRAMINE HCL 50 MG/ML IJ SOLN
INTRAMUSCULAR | Status: AC
  Filled 2013-07-13: qty 1

## 2013-07-13 MED ORDER — LIDOCAINE VISCOUS 2 % MT SOLN
OROMUCOSAL | Status: DC | PRN
  Administered 2013-07-13: 10 mL via OROMUCOSAL

## 2013-07-13 MED ORDER — LIDOCAINE VISCOUS 2 % MT SOLN
OROMUCOSAL | Status: AC
  Filled 2013-07-13: qty 15

## 2013-07-13 MED ORDER — MIDAZOLAM HCL 10 MG/2ML IJ SOLN
INTRAMUSCULAR | Status: DC | PRN
  Administered 2013-07-13 (×2): 2.5 mg via INTRAVENOUS
  Administered 2013-07-13 (×2): 2 mg via INTRAVENOUS

## 2013-07-13 MED ORDER — RITONAVIR 100 MG PO TABS
100.0000 mg | ORAL_TABLET | Freq: Every day | ORAL | Status: DC
  Administered 2013-07-14 – 2013-07-16 (×3): 100 mg via ORAL
  Filled 2013-07-13 (×5): qty 1

## 2013-07-13 MED ORDER — SODIUM CHLORIDE 0.9 % IV SOLN
INTRAVENOUS | Status: DC
  Administered 2013-07-13 – 2013-07-14 (×3): via INTRAVENOUS
  Filled 2013-07-13 (×13): qty 1000

## 2013-07-13 MED ORDER — FENTANYL CITRATE 0.05 MG/ML IJ SOLN
INTRAMUSCULAR | Status: AC
  Filled 2013-07-13: qty 4

## 2013-07-13 NOTE — Progress Notes (Signed)
[facility] PROGRESS NOTE  Assessment/Plan: Abdominal pain, epigastric/retrostenal: -  Pain is not improved. Try lidocaine gel - Has not followed with HIV clinic. - 7.27.2014 started  IV Diflucan.  - consult GI for EGD 7.29.2014. - CD 4 < 10 7.28.2014.  Hypokalemia -  Replete, check b-met in am.  Bipolar disorder - stable.    HIV INFECTION - cont home meds.  - check an HIV. - start azithromycin and atovaquone. - appreciate ID assistance.   Microcytic anemia - menstruating female. - start ferrous sulfate check ferritin.    Code Status: full Family Communication: none  Disposition Plan: Inpatient   Consultants:  surgery  Procedures:  none  Antibiotics:  azithro  Atovaquone  truvada  preszista      HPI/Subjective: unchanged  Objective: Filed Vitals:   07/12/13 0539 07/12/13 1444 07/12/13 2150 07/13/13 0607  BP: 177/77 134/73 153/88 110/69  Pulse: 71 70 81 62  Temp: 98.1 F (36.7 C) 98.2 F (36.8 C) 98.2 F (36.8 C) 98.1 F (36.7 C)  TempSrc: Oral Oral Oral Oral  Resp: 16 18 16 18  Height:      Weight:      SpO2: 100% 100% 95% 100%    Intake/Output Summary (Last 24 hours) at 07/13/13 1024 Last data filed at 07/13/13 0608  Gross per 24 hour  Intake   1200 ml  Output   1700 ml  Net   -500 ml   Filed Weights   07/09/13 1556  Weight: 56.9 kg (125 lb 7.1 oz)    Exam:  General: Alert, awake, oriented x3, in no acute distress. cachectic HEENT: No bruits, no goiter.  Heart: Regular rate and rhythm, without murmurs, rubs, gallops.  Lungs: Good air movement, bilateral air movement.  Abdomen: Soft, nontender, nondistended, positive bowel sounds.  Neuro: Grossly intact, nonfocal.   Data Reviewed: Basic Metabolic Panel:  Recent Labs Lab 07/09/13 1200 07/10/13 0430 07/10/13 1109 07/12/13 0353 07/13/13 0405  NA 138 137  --  137 136  K 3.4* 3.1*  --  3.0* 3.4*  CL 101 104  --  105 105  CO2 23 21  --  22 19  GLUCOSE 88  75  --  92 85  BUN 10 8  --  3* 3*  CREATININE 0.46* 0.44*  --  0.52 0.50  CALCIUM 9.7 8.8  --  8.3* 8.7  MG  --   --  1.8  --   --    Liver Function Tests:  Recent Labs Lab 07/09/13 1200 07/10/13 0430 07/13/13 0405  AST 37 34 22  ALT 20 17 13  ALKPHOS 145* 122* 106  BILITOT 0.5 0.5 0.4  PROT 7.5 6.4 5.9*  ALBUMIN 3.4* 2.8* 2.7*    Recent Labs Lab 07/09/13 1200 07/10/13 0430 07/13/13 0405  LIPASE 92* 84* 47   No results found for this basename: AMMONIA,  in the last 168 hours CBC:  Recent Labs Lab 07/09/13 1200 07/10/13 0430 07/13/13 0405  WBC 2.6* 3.0* 3.7*  HGB 7.2* 8.8* 9.0*  HCT 23.1* 26.7* 27.3*  MCV 62.3* 68.3* 66.6*  PLT 226 176 187   Cardiac Enzymes: No results found for this basename: CKTOTAL, CKMB, CKMBINDEX, TROPONINI,  in the last 168 hours BNP (last 3 results) No results found for this basename: PROBNP,  in the last 8760 hours CBG: No results found for this basename: GLUCAP,  in the last 168 hours  Recent Results (from the past 240 hour(s))  URINE CULTURE       Status: None   Collection Time    07/04/13  4:40 PM      Result Value Range Status   Specimen Description URINE, CLEAN CATCH   Final   Special Requests NONE   Final   Culture  Setup Time 07/04/2013 21:27   Final   Colony Count NO GROWTH   Final   Culture NO GROWTH   Final   Report Status 07/06/2013 FINAL   Final     Studies: Nm Hepatobiliary  07/12/2013   *RADIOLOGY REPORT*  Clinical Data: Right upper quadrant pain, nausea.  NUCLEAR MEDICINE HEPATOHBILIARY INCLUDE GB  Radiopharmaceutical:  5.4 mCi technetium 99m MDP IV.  Comparison: Ultrasound 07/09/2013  Findings: Prompt uptake and excretion of radiotracer by the liver. Activity seen within the gallbladder at 10-15 minutes and small bowel at 35 minutes.  IMPRESSION: No evidence of cystic duct or common bile duct obstruction.   Original Report Authenticated By: [name] Dover, M.D.    Scheduled Meds: . atovaquone  1,500 mg Oral Q  breakfast  . azithromycin  1,200 mg Oral Weekly  . benztropine  2 mg Oral Daily  . darunavir  800 mg Oral Q breakfast  . emtricitabine-tenofovir  1 tablet Oral Q breakfast  . enoxaparin (LOVENOX) injection  40 mg Subcutaneous Q24H  . fluconazole (DIFLUCAN) IV  100 mg Intravenous Q24H  . multivitamin with minerals  1 tablet Oral Daily  . nystatin  400,000 Units Oral QID  . OLANZapine zydis  20 mg Oral Daily  . pantoprazole  40 mg Oral Daily  . potassium chloride  40 mEq Oral TID  . ritonavir  100 mg Oral Q breakfast   Continuous Infusions: . 0.9 % sodium chloride with kcl 100 mL/hr at 07/13/13 0559     FELIZ ORTIZ, Emmilee Reamer  [facility] Pager [phone]. If 8PM-8AM, please contact night-coverage at [email or web address], password [email or web address] 07/13/2013, 10:24 AM  LOS: 4 days        

## 2013-07-13 NOTE — Interval H&P Note (Signed)
History and Physical Interval Note:  07/13/2013 2:32 PM  [name]  has presented today for surgery, with the diagnosis of Odynophagia, Dysphagia, and Weight loss.  The various methods of treatment have been discussed with the patient and family. After consideration of risks, benefits and other options for treatment, the patient has consented to  Procedure(s): ESOPHAGOGASTRODUODENOSCOPY (EGD) (N/A) as a surgical intervention .  The patient's history has been reviewed, patient examined, no change in status, stable for surgery.  I have reviewed the patient's chart and labs.  Questions were answered to the patient's satisfaction.     Bowdy Bair D

## 2013-07-13 NOTE — H&P (View-Only) (Signed)
[facility] PROGRESS NOTE  Assessment/Plan: Abdominal pain, epigastric/retrostenal: -  Pain is not improved. Try lidocaine gel - Has not followed with HIV clinic. - 7.27.2014 started  IV Diflucan.  - consult GI for EGD 7.29.2014. - CD 4 < 10 7.28.2014.  Hypokalemia -  Replete, check b-met in am.  Bipolar disorder - stable.    HIV INFECTION - cont home meds.  - check an HIV. - start azithromycin and atovaquone. - appreciate ID assistance.   Microcytic anemia - menstruating female. - start ferrous sulfate check ferritin.    Code Status: full Family Communication: none  Disposition Plan: Inpatient   Consultants:  surgery  Procedures:  none  Antibiotics:  azithro  Atovaquone  truvada  preszista      HPI/Subjective: unchanged  Objective: Filed Vitals:   07/12/13 0539 07/12/13 1444 07/12/13 2150 07/13/13 0607  BP: 177/77 134/73 153/88 110/69  Pulse: 71 70 81 62  Temp: 98.1 F (36.7 C) 98.2 F (36.8 C) 98.2 F (36.8 C) 98.1 F (36.7 C)  TempSrc: Oral Oral Oral Oral  Resp: 16 18 16 18   Height:      Weight:      SpO2: 100% 100% 95% 100%    Intake/Output Summary (Last 24 hours) at 07/13/13 1024 Last data filed at 07/13/13 1610  Gross per 24 hour  Intake   1200 ml  Output   1700 ml  Net   -500 ml   Filed Weights   07/09/13 1556  Weight: 56.9 kg (125 lb 7.1 oz)    Exam:  General: Alert, awake, oriented x3, in no acute distress. cachectic HEENT: No bruits, no goiter.  Heart: Regular rate and rhythm, without murmurs, rubs, gallops.  Lungs: Good air movement, bilateral air movement.  Abdomen: Soft, nontender, nondistended, positive bowel sounds.  Neuro: Grossly intact, nonfocal.   Data Reviewed: Basic Metabolic Panel:  Recent Labs Lab 07/09/13 1200 07/10/13 0430 07/10/13 1109 07/12/13 0353 07/13/13 0405  NA 138 137  --  137 136  K 3.4* 3.1*  --  3.0* 3.4*  CL 101 104  --  105 105  CO2 23 21  --  22 19  GLUCOSE 88  75  --  92 85  BUN 10 8  --  3* 3*  CREATININE 0.46* 0.44*  --  0.52 0.50  CALCIUM 9.7 8.8  --  8.3* 8.7  MG  --   --  1.8  --   --    Liver Function Tests:  Recent Labs Lab 07/09/13 1200 07/10/13 0430 07/13/13 0405  AST 37 34 22  ALT 20 17 13   ALKPHOS 145* 122* 106  BILITOT 0.5 0.5 0.4  PROT 7.5 6.4 5.9*  ALBUMIN 3.4* 2.8* 2.7*    Recent Labs Lab 07/09/13 1200 07/10/13 0430 07/13/13 0405  LIPASE 92* 84* 47   No results found for this basename: AMMONIA,  in the last 168 hours CBC:  Recent Labs Lab 07/09/13 1200 07/10/13 0430 07/13/13 0405  WBC 2.6* 3.0* 3.7*  HGB 7.2* 8.8* 9.0*  HCT 23.1* 26.7* 27.3*  MCV 62.3* 68.3* 66.6*  PLT 226 176 187   Cardiac Enzymes: No results found for this basename: CKTOTAL, CKMB, CKMBINDEX, TROPONINI,  in the last 168 hours BNP (last 3 results) No results found for this basename: PROBNP,  in the last 8760 hours CBG: No results found for this basename: GLUCAP,  in the last 168 hours  Recent Results (from the past 240 hour(s))  URINE CULTURE  Status: None   Collection Time    07/04/13  4:40 PM      Result Value Range Status   Specimen Description URINE, CLEAN CATCH   Final   Special Requests NONE   Final   Culture  Setup Time 07/04/2013 21:27   Final   Colony Count NO GROWTH   Final   Culture NO GROWTH   Final   Report Status 07/06/2013 FINAL   Final     Studies: Nm Hepatobiliary  07/12/2013   *RADIOLOGY REPORT*  Clinical Data: Right upper quadrant pain, nausea.  NUCLEAR MEDICINE HEPATOHBILIARY INCLUDE GB  Radiopharmaceutical:  5.4 mCi technetium 26m MDP IV.  Comparison: Ultrasound 07/09/2013  Findings: Prompt uptake and excretion of radiotracer by the liver. Activity seen within the gallbladder at 10-15 minutes and small bowel at 35 minutes.  IMPRESSION: No evidence of cystic duct or common bile duct obstruction.   Original Report Authenticated By: Charlett Nose, M.D.    Scheduled Meds: . atovaquone  1,500 mg Oral Q  breakfast  . azithromycin  1,200 mg Oral Weekly  . benztropine  2 mg Oral Daily  . darunavir  800 mg Oral Q breakfast  . emtricitabine-tenofovir  1 tablet Oral Q breakfast  . enoxaparin (LOVENOX) injection  40 mg Subcutaneous Q24H  . fluconazole (DIFLUCAN) IV  100 mg Intravenous Q24H  . multivitamin with minerals  1 tablet Oral Daily  . nystatin  400,000 Units Oral QID  . OLANZapine zydis  20 mg Oral Daily  . pantoprazole  40 mg Oral Daily  . potassium chloride  40 mEq Oral TID  . ritonavir  100 mg Oral Q breakfast   Continuous Infusions: . 0.9 % sodium chloride with kcl 100 mL/hr at 07/13/13 0559     Marinda Elk  [facility] Pager [phone]. If 8PM-8AM, please contact night-coverage at [email or web address], password [facility] 07/13/2013, 10:24 AM  LOS: 4 days

## 2013-07-13 NOTE — Progress Notes (Signed)
General surgery attending note.  Agree with the assessment and treatment plan outlined by Sherrie George, PA. Normal hepatobiliary scan pretty much ruled out acute cholecystitis. [facility]her EGD noted.  Please reconsult if surgical problems arise.  [name]. Derrell Lolling, M.D., [facility] General and Minimally invasive Surgery Breast and Colorectal Surgery Office:   [phone] Pager:   [phone]

## 2013-07-13 NOTE — Progress Notes (Signed)
Fentanyl & Versed 1mg  given iv for restlessness during EGD procedure

## 2013-07-13 NOTE — Op Note (Addendum)
[facility] [address] [place], [address]   OPERATIVE PROCEDURE REPORT  PATIENT: [name], [name]  MR#: [id number] BIRTHDATE: 06-09-1976  GENDER: Female ENDOSCOPIST: Jeani Hawking, MD ASSISTANT:   Karie Soda, technician PROCEDURE DATE: 07/13/2013 PROCEDURE:   EGD w/ biopsy ASA CLASS:   Class III INDICATIONS:Dysphagia. MEDICATIONS: Versed 10 mg IV and Fentanyl 100 mcg IV TOPICAL ANESTHETIC:   Xylocaine Jelly  DESCRIPTION OF PROCEDURE:   After the risks benefits and alternatives of the procedure were thoroughly explained, informed consent was obtained.  The Pentax Gastroscope [id number]  endoscope was introduced through the mouth  and advanced to the second portion of the duodenum Without limitations.      The instrument was slowly withdrawn as the mucosa was fully examined.      FINDINGS: A large 5 cm mostly circumfirential ulcer was noted in the upper third of the esophagus starting at 25 cm.  Multiple cold biopsies around the edges were obtained as well as biopsies of the ulcer bed.  The ulcer bed was difficult to biopsy as it was firm. Distal to this point there was no abnormalites with the esophagus. In the gastric lumen there was a diffuse erythema and cold biopsies were obtained.  The duodenum was normal.          The scope was then withdrawn from the patient and the procedure terminated.  COMPLICATIONS: There were no complications.  IMPRESSION: 1) Large 5 cm proximal esophageal ulcer (25-30 cm) - ? HSV, CMV, or HIV.  RECOMMENDATIONS: 1) Await biopsy results. 2) Continue with viscous lidocaine and IV hydration.  _______________________________ eSignedJeani Hawking, MD 07/13/2013 3:10 PM

## 2013-07-13 NOTE — Progress Notes (Signed)
Patient ID: [name], female   DOB: 1976-04-16, 37 y.o.   MRN: [id number]    Subjective: Symptoms pretty much the same, for EGD today  Objective: Vital signs in last 24 hours: Temp:  [98.1 F (36.7 C)-98.2 F (36.8 C)] 98.1 F (36.7 C) (07/29 0607) Pulse Rate:  [62-81] 62 (07/29 0607) Resp:  [16-18] 18 (07/29 0607) BP: (110-153)/(69-88) 110/69 mmHg (07/29 0607) SpO2:  [95 %-100 %] 100 % (07/29 0607) Last BM Date: 07/09/13  Intake/Output from previous day: 07/28 0701 - 07/29 0700 In: 1540 [P.O.:340; I.V.:1200] Out: 2000 [Urine:2000] Intake/Output this shift:    General appearance: alert, cooperative and no distress GI: soft, very tender in RUQ and epigastric region, +BS  Lab Results:   Recent Labs  07/13/13 0405  WBC 3.7*  HGB 9.0*  HCT 27.3*  PLT 187   BMET  Recent Labs  07/12/13 0353 07/13/13 0405  NA 137 136  K 3.0* 3.4*  CL 105 105  CO2 22 19  GLUCOSE 92 85  BUN 3* 3*  CREATININE 0.52 0.50  CALCIUM 8.3* 8.7   PT/INR No results found for this basename: LABPROT, INR,  in the last 72 hours ABG No results found for this basename: PHART, PCO2, PO2, HCO3,  in the last 72 hours  Studies/Results: Nm Hepatobiliary  07/12/2013   *RADIOLOGY REPORT*  Clinical Data: Right upper quadrant pain, nausea.  NUCLEAR MEDICINE HEPATOHBILIARY INCLUDE GB  Radiopharmaceutical:  5.4 mCi technetium 6m MDP IV.  Comparison: Ultrasound 07/09/2013  Findings: Prompt uptake and excretion of radiotracer by the liver. Activity seen within the gallbladder at 10-15 minutes and small bowel at 35 minutes.  IMPRESSION: No evidence of cystic duct or common bile duct obstruction.   Original Report Authenticated By: Charlett Nose, M.D.    Anti-infectives: Anti-infectives   Start     Dose/Rate Route Frequency Ordered Stop   07/11/13 1000  fluconazole (DIFLUCAN) IVPB 100 mg     100 mg 50 mL/hr over 60 Minutes Intravenous Every 24 hours 07/10/13 1109     07/10/13 1600  azithromycin  (ZITHROMAX) 200 MG/5ML suspension 1,200 mg     1,200 mg Oral Weekly 07/10/13 1200     07/10/13 1230  atovaquone (MEPRON) 750 MG/5ML suspension 1,500 mg     1,500 mg Oral Daily with breakfast 07/10/13 1124     07/10/13 1200  fluconazole (DIFLUCAN) IVPB 200 mg     200 mg 100 mL/hr over 60 Minutes Intravenous  Once 07/10/13 1109 07/10/13 1520   07/10/13 1200  azithromycin (ZITHROMAX) tablet 500 mg  Status:  Discontinued     500 mg Oral Daily 07/10/13 1124 07/10/13 1200   07/09/13 1800  Darunavir Ethanolate (PREZISTA) tablet 800 mg     800 mg Oral Daily with breakfast 07/09/13 1631     07/09/13 1800  ritonavir (NORVIR) capsule 100 mg     100 mg Oral Daily with breakfast 07/09/13 1631     07/09/13 1800  emtricitabine-tenofovir (TRUVADA) 200-300 MG per tablet 1 tablet     1 tablet Oral Daily with breakfast 07/09/13 1631        Assessment/Plan: Abdominal pain/gallbladder sludge: HIDA scan was normal, do not see any indication for acute surgical intervention at this time, will sign off but please call with questions/concerns.    LOS: 4 days    WHITE, ELIZABETH 07/13/2013

## 2013-07-14 ENCOUNTER — Encounter (HOSPITAL_COMMUNITY): Payer: Self-pay | Admitting: Gastroenterology

## 2013-07-14 MED ORDER — GANCICLOVIR SODIUM 500 MG IV SOLR
5.0000 mg/kg | Freq: Two times a day (BID) | INTRAVENOUS | Status: DC
  Administered 2013-07-14 – 2013-07-15 (×2): 285 mg via INTRAVENOUS
  Filled 2013-07-14 (×3): qty 285

## 2013-07-14 MED ORDER — FLUCONAZOLE 40 MG/ML PO SUSR
100.0000 mg | Freq: Every day | ORAL | Status: DC
  Administered 2013-07-15 – 2013-07-16 (×2): 100 mg via ORAL
  Filled 2013-07-14 (×2): qty 2.5

## 2013-07-14 MED ORDER — HYDROCODONE-ACETAMINOPHEN 5-325 MG PO TABS
1.0000 | ORAL_TABLET | ORAL | Status: DC | PRN
  Administered 2013-07-14 – 2013-07-16 (×6): 1 via ORAL
  Filled 2013-07-14 (×6): qty 1

## 2013-07-14 NOTE — Progress Notes (Signed)
Unassigned patient;  Subjective: Patient is struggling to eat. Wants a [place] sandwich. Has severe retrosternal pain while swallowing. Finding on EGD noted.  Objective: Vital signs in last 24 hours: Temp:  [97.9 F (36.6 C)-98.3 F (36.8 C)] 98.3 F (36.8 C) (07/30 1346) Pulse Rate:  [64-86] 74 (07/30 1346) Resp:  [14] 14 (07/30 1346) BP: (103-147)/(72-102) 147/102 mmHg (07/30 1346) SpO2:  [98 %-100 %] 100 % (07/30 1346) Last BM Date: 07/09/13  Intake/Output from previous day: 07/29 0701 - 07/30 0700 In: 1636.7 [I.V.:1636.7] Out: 400 [Urine:400] Intake/Output this shift: Total I/O In: 360 [P.O.:360] Out: 600 [Urine:600]  General appearance: cooperative, cachectic, fatigued, mild distress and pale Resp: clear to auscultation bilaterally Cardio: regular rate and rhythm, S1, S2 normal, no murmur, click, rub or gallop GI: soft, non-tender; bowel sounds normal; no masses,  no organomegaly Extremities: extremities normal, atraumatic, no cyanosis or edema  Lab Results:  Recent Labs  07/13/13 0405  WBC 3.7*  HGB 9.0*  HCT 27.3*  PLT 187   BMET  Recent Labs  07/12/13 0353 07/13/13 0405  NA 137 136  K 3.0* 3.4*  CL 105 105  CO2 22 19  GLUCOSE 92 85  BUN 3* 3*  CREATININE 0.52 0.50  CALCIUM 8.3* 8.7   LFT  Recent Labs  07/13/13 0405  PROT 5.9*  ALBUMIN 2.7*  AST 22  ALT 13  ALKPHOS 106  BILITOT 0.4   Medications: I have reviewed the patient's current medications.  Assessment/Plan: Dysphagia in a HIV positive patient with a large esophageal ulcer-biopsies pending. Will sign off for now. Please call as needed. As discussed with Dr. Malachi Bonds, will advance her diet as tolerated.  LOS: 5 days   Gemma Ruan 07/14/2013, 4:56 PM

## 2013-07-14 NOTE — Progress Notes (Signed)
[facility] PROGRESS NOTE  Assessment/Plan: Large 5cm esophageal ulcer is likely contributing to epigastric/retrostenal pain.  Ddx includes CMV, HSV, HIV ulceration given AIDS -  Biopsy results pending -  Appreciate GI assistance - 7.27.2014 IV Diflucan.  - CD 4 < 10 7.28.2014. 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  Stopped ART several weeks ago secondary to painful swallowing - either she is noncompliant or has developed resistance to her current ART (less likely with boosted PI regimen) - Consider testing again for mutations if patient truly compliant, but will defer to ID - continue azithromycin and atovaquone. - appreciate ID assistance.  Hypokalemia, resolving with supplementation -  Lab holiday today -  Repeat in AM  Bipolar disorder, stable.  Continue zyprexa   Microcytic anemia - menstruating female. - iron and iron saturations low, however TIBC and ferritin less consistent with iron deficiency -  Folate/B12 wnl -  Consider marrow suppression from HIV or MAC (although not pancytopenic) -  Send fungal blood cultures  DIET:  Full liquid diet ACCESS:  PIV IVF:  NS at 140ml/h  PROPH:  lovenox   Code Status: full Family Communication: none  Disposition Plan: Inpatient, awaiting biopsy report   Consultants:  surgery  Procedures:  none  Antibiotics:  azithro  Atovaquone  truvada  preszista  HPI/Subjective: Patient states she is eating a little easier today.  Pain persists, but improved.   Objective: Filed Vitals:   07/13/13 1607 07/13/13 2059 07/14/13 0448 07/14/13 1346  BP:  125/82 103/72 147/102  Pulse:  64 86 74  Temp:  97.9 F (36.6 C) 98.1 F (36.7 C) 98.3 F (36.8 C)  TempSrc:  Oral Oral Oral  Resp: 16 14 14 14   Height:      Weight:      SpO2:  100% 98% 100%    Intake/Output Summary (Last 24 hours) at 07/14/13 1417 Last data filed at 07/14/13 1200  Gross per 24 hour  Intake 1996.67 ml   Output    600 ml  Net 1396.67 ml   Filed Weights   07/09/13 1556  Weight: 56.9 kg (125 lb 7.1 oz)    Exam:  General: Alert, awake, oriented x3, in no acute distress. cachectic HEENT: No bruits, no goiter.  Heart: Regular rate and rhythm, without murmurs, rubs, gallops.  Lungs: CTAB, no increased WOB Abdomen: Soft, nondistended, positive bowel sounds.  Mild TTP in epigastrium without rebound or guarding.   Neuro: Grossly intact, nonfocal.   Data Reviewed: Basic Metabolic Panel:  Recent Labs Lab 07/09/13 1200 07/10/13 0430 07/10/13 1109 07/12/13 0353 07/13/13 0405  NA 138 137  --  137 136  K 3.4* 3.1*  --  3.0* 3.4*  CL 101 104  --  105 105  CO2 23 21  --  22 19  GLUCOSE 88 75  --  92 85  BUN 10 8  --  3* 3*  CREATININE 0.46* 0.44*  --  0.52 0.50  CALCIUM 9.7 8.8  --  8.3* 8.7  MG  --   --  1.8  --   --    Liver Function Tests:  Recent Labs Lab 07/09/13 1200 07/10/13 0430 07/13/13 0405  AST 37 34 22  ALT 20 17 13   ALKPHOS 145* 122* 106  BILITOT 0.5 0.5 0.4  PROT 7.5 6.4 5.9*  ALBUMIN 3.4* 2.8* 2.7*    Recent Labs Lab 07/09/13 1200 07/10/13 0430 07/13/13 0405  LIPASE 92* 84* 47   No results found for this basename: AMMONIA,  in the last 168 hours CBC:  Recent Labs Lab 07/09/13 1200 07/10/13 0430 07/13/13 0405  WBC 2.6* 3.0* 3.7*  HGB 7.2* 8.8* 9.0*  HCT 23.1* 26.7* 27.3*  MCV 62.3* 68.3* 66.6*  PLT 226 176 187   Cardiac Enzymes: No results found for this basename: CKTOTAL, CKMB, CKMBINDEX, TROPONINI,  in the last 168 hours BNP (last 3 results) No results found for this basename: PROBNP,  in the last 8760 hours CBG: No results found for this basename: GLUCAP,  in the last 168 hours  Recent Results (from the past 240 hour(s))  URINE CULTURE     Status: None   Collection Time    07/04/13  4:40 PM      Result Value Range Status   Specimen Description URINE, CLEAN CATCH   Final   Special Requests NONE   Final   Culture  Setup Time  07/04/2013 21:27   Final   Colony Count NO GROWTH   Final   Culture NO GROWTH   Final   Report Status 07/06/2013 FINAL   Final     Studies: Nm Hepatobiliary  07/12/2013   *RADIOLOGY REPORT*  Clinical Data: Right upper quadrant pain, nausea.  NUCLEAR MEDICINE HEPATOHBILIARY INCLUDE GB  Radiopharmaceutical:  5.4 mCi technetium 45m MDP IV.  Comparison: Ultrasound 07/09/2013  Findings: Prompt uptake and excretion of radiotracer by the liver. Activity seen within the gallbladder at 10-15 minutes and small bowel at 35 minutes.  IMPRESSION: No evidence of cystic duct or common bile duct obstruction.   Original Report Authenticated By: Charlett Nose, M.D.    Scheduled Meds: . atovaquone  1,500 mg Oral Q breakfast  . azithromycin  1,200 mg Oral Weekly  . benztropine  2 mg Oral Daily  . darunavir  800 mg Oral Q breakfast  . emtricitabine-tenofovir  1 tablet Oral Q breakfast  . enoxaparin (LOVENOX) injection  40 mg Subcutaneous Q24H  . fluconazole (DIFLUCAN) IV  100 mg Intravenous Q24H  . multivitamin with minerals  1 tablet Oral Daily  . nystatin  400,000 Units Oral QID  . OLANZapine zydis  20 mg Oral Daily  . pantoprazole  40 mg Oral Daily  . ritonavir  100 mg Oral Q breakfast   Continuous Infusions: . sodium chloride    . 0.9 % sodium chloride with kcl 100 mL/hr at 07/14/13 0846     [name], [facility]  [facility] Pager [phone]. If 8PM-8AM, please contact night-coverage at [email or web address], password [facility] 07/14/2013, 2:17 PM  LOS: 5 days

## 2013-07-14 NOTE — Progress Notes (Signed)
INFECTIOUS DISEASE PROGRESS NOTE  ID: [name] is a 36 y.o. female with  Principal Problem:   Abdominal pain, epigastric Active Problems:   HIV INFECTION   Microcytic anemia   Bipolar disorder   Hypokalemia   Protein-calorie malnutrition, severe   Follicular adenoma of thyroid gland   Hepatitis B   Elevated lipase   Elevated alkaline phosphatase level   Unintentional weight loss   Dysphagia   Odynophagia  Subjective: Feels better, eating soup. Some problems with swallowing pills (iron).   Abtx:  Anti-infectives   Start     Dose/Rate Route Frequency Ordered Stop   07/13/13 1900  ritonavir (NORVIR) tablet 100 mg     100 mg Oral Daily with breakfast 07/13/13 1853     07/11/13 1000  fluconazole (DIFLUCAN) IVPB 100 mg     100 mg 50 mL/hr over 60 Minutes Intravenous Every 24 hours 07/10/13 1109     07/10/13 1600  azithromycin (ZITHROMAX) 200 MG/5ML suspension 1,200 mg     1,200 mg Oral Weekly 07/10/13 1200     07/10/13 1230  atovaquone (MEPRON) 750 MG/5ML suspension 1,500 mg     1,500 mg Oral Daily with breakfast 07/10/13 1124     07/10/13 1200  fluconazole (DIFLUCAN) IVPB 200 mg     200 mg 100 mL/hr over 60 Minutes Intravenous  Once 07/10/13 1109 07/10/13 1520   07/10/13 1200  azithromycin (ZITHROMAX) tablet 500 mg  Status:  Discontinued     500 mg Oral Daily 07/10/13 1124 07/10/13 1200   07/09/13 1800  Darunavir Ethanolate (PREZISTA) tablet 800 mg     800 mg Oral Daily with breakfast 07/09/13 1631     07/09/13 1800  ritonavir (NORVIR) capsule 100 mg  Status:  Discontinued     100 mg Oral Daily with breakfast 07/09/13 1631 07/13/13 1853   07/09/13 1800  emtricitabine-tenofovir (TRUVADA) 200-300 MG per tablet 1 tablet     1 tablet Oral Daily with breakfast 07/09/13 1631        Medications:  Scheduled: . atovaquone  1,500 mg Oral Q breakfast  . azithromycin  1,200 mg Oral Weekly  . benztropine  2 mg Oral Daily  . darunavir  800 mg Oral Q breakfast  .  emtricitabine-tenofovir  1 tablet Oral Q breakfast  . enoxaparin (LOVENOX) injection  40 mg Subcutaneous Q24H  . fluconazole (DIFLUCAN) IV  100 mg Intravenous Q24H  . multivitamin with minerals  1 tablet Oral Daily  . nystatin  400,000 Units Oral QID  . OLANZapine zydis  20 mg Oral Daily  . pantoprazole  40 mg Oral Daily  . ritonavir  100 mg Oral Q breakfast    Objective: Vital signs in last 24 hours: Temp:  [97.9 F (36.6 C)-98.3 F (36.8 C)] 98.3 F (36.8 C) (07/30 1346) Pulse Rate:  [64-86] 74 (07/30 1346) Resp:  [14] 14 (07/30 1346) BP: (103-147)/(72-102) 147/102 mmHg (07/30 1346) SpO2:  [98 %-100 %] 100 % (07/30 1346)   General appearance: alert, cooperative and no distress Resp: clear to auscultation bilaterally Cardio: regular rate and rhythm GI: normal findings: bowel sounds normal and soft, non-tender  Lab Results  Recent Labs  07/12/13 0353 07/13/13 0405  WBC  --  3.7*  HGB  --  9.0*  HCT  --  27.3*  NA 137 136  K 3.0* 3.4*  CL 105 105  CO2 22 19  BUN 3* 3*  CREATININE 0.52 0.50   Liver Panel  Recent Labs  07/13/13 0405  PROT 5.9*  ALBUMIN 2.7*  AST 22  ALT 13  ALKPHOS 106  BILITOT 0.4   Sedimentation Rate No results found for this basename: ESRSEDRATE,  in the last 72 hours C-Reactive Protein No results found for this basename: CRP,  in the last 72 hours  Microbiology: Recent Results (from the past 240 hour(s))  URINE CULTURE     Status: None   Collection Time    07/04/13  4:40 PM      Result Value Range Status   Specimen Description URINE, CLEAN CATCH   Final   Special Requests NONE   Final   Culture  Setup Time 07/04/2013 21:27   Final   Colony Count NO GROWTH   Final   Culture NO GROWTH   Final   Report Status 07/06/2013 FINAL   Final    Studies/Results: No results found.   Assessment/Plan: Esophageal Ulcer (5 cm) AIDS  Total days of antibiotics: none Diflucan day 3  Will start ganciclovir IV, change to valcyte after  able to swallow pills well.  Change diflucan to liquid Await pathology  Azithro/atovaqoune DRVr/TRV         [name] Infectious Diseases (pager) [phone] [email or web address] 07/14/2013, 4:35 PM  LOS: 5 days

## 2013-07-15 DIAGNOSIS — K2210 Ulcer of esophagus without bleeding: Secondary | ICD-10-CM

## 2013-07-15 LAB — BASIC METABOLIC PANEL
BUN: 5 mg/dL — ABNORMAL LOW (ref 6–23)
CO2: 21 mEq/L (ref 19–32)
Glucose, Bld: 120 mg/dL — ABNORMAL HIGH (ref 70–99)
Potassium: 3.6 mEq/L (ref 3.5–5.1)
Sodium: 136 mEq/L (ref 135–145)

## 2013-07-15 LAB — CBC
Hemoglobin: 8.5 g/dL — ABNORMAL LOW (ref 12.0–15.0)
MCH: 22.4 pg — ABNORMAL LOW (ref 26.0–34.0)
MCHC: 33.3 g/dL (ref 30.0–36.0)
MCV: 67.3 fL — ABNORMAL LOW (ref 78.0–100.0)
RBC: 3.79 MIL/uL — ABNORMAL LOW (ref 3.87–5.11)

## 2013-07-15 MED ORDER — ENSURE COMPLETE PO LIQD: 237.0000 mL | Freq: Two times a day (BID) | ORAL | Status: DC

## 2013-07-15 MED ORDER — MAGNESIUM SULFATE 40 MG/ML IJ SOLN
2.0000 g | Freq: Once | INTRAMUSCULAR | Status: AC
  Administered 2013-07-15: 2 g via INTRAVENOUS
  Filled 2013-07-15: qty 50

## 2013-07-15 MED ORDER — VALGANCICLOVIR HCL 50 MG/ML PO SOLR
900.0000 mg | Freq: Two times a day (BID) | ORAL | Status: DC
  Administered 2013-07-15 – 2013-07-16 (×2): 900 mg via ORAL
  Filled 2013-07-15 (×3): qty 18

## 2013-07-15 NOTE — Care Management Note (Signed)
  Page 1 of 1   07/15/2013     3:29:18 PM   CARE MANAGEMENT NOTE 07/15/2013  Patient:  [name], [name]   Account Number:  [id number]  Date Initiated:  07/11/2013  Documentation initiated by:  [facility]  Subjective/Objective Assessment:   37 year old female admitted with abdominal pain.     Action/Plan:   From home.   Anticipated DC Date:  07/15/2013   Anticipated DC Plan:  HOME/SELF CARE  In-house referral  Financial Counselor      DC Planning Services  CM consult      Choice offered to / List presented to:             Status of service:  In process, will continue to follow Medicare Important Message given?  NA - LOS <3 / Initial given by admissions (If response is "NO", the following Medicare IM given date fields will be blank) Date Medicare IM given:   Date Additional Medicare IM given:    Discharge Disposition:    Per UR Regulation:  Reviewed for med. necessity/level of care/duration of stay  If discussed at Long Length of Stay Meetings, dates discussed:    Comments:  07/15/2013 Colleen Can BSN RN CCM [phone] CM spoke with patient regarding discharge plans. Plans are for her to return to her home in [place] where she lives with her 2 sons. States her sister will provide support as needed. Pt is able to ambulate without assistance if she is not dizzy. Currently no DME needs. Sees ID specialist  q3 months. Gets meds from ID office free. PCP is [facility]. States she applied for Medicaid about 1 month ago. CM will follow.

## 2013-07-15 NOTE — Progress Notes (Addendum)
[facility] PROGRESS NOTE  Assessment/Plan: Large 5cm esophageal ulcer is likely contributing to epigastric/retrostenal pain.  Ddx includes CMV, HSV, HIV ulceration given AIDS -  Biopsy results still pending -  Appreciate GI assistance - Continue fluconazole and ganciclovir changed to valcyte today - CD 4 < 10 7.28.2014. -  Decrease vicodin - Not eating as well today 480 vs. 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  Stopped ART several weeks ago secondary to painful swallowing - either she is noncompliant or has developed resistance to her current ART (less likely with boosted PI regimen) - Consider testing again for mutations if patient truly compliant, but will defer to ID - continue azithromycin and atovaquone. - appreciate ID assistance.  Hypomagnesemia, replete with 2gm IV mag sulfate  Bipolar disorder, stable.  Continue zyprexa   Microcytic anemia  - menstruating female. - iron and iron saturations low, however TIBC and ferritin less consistent with iron deficiency -  Folate/B12 wnl -  Consider marrow suppression from HIV or MAC (although not pancytopenic) -  F/u fungal blood cultures  DIET:  Full liquid diet ACCESS:  PIV IVF:  KVO PROPH:  lovenox   Code Status: full Family Communication: none  Disposition Plan:  Likely home tomorrow on valcyte   Consultants:  surgery  Procedures:  none  Antibiotics:  azithro  Atovaquone  truvada  preszista  HPI/Subjective: Patient states she is eating a little easier today.  Mild nausea.    Objective: Filed Vitals:   07/14/13 0448 07/14/13 1346 07/14/13 2100 07/15/13 0448  BP: 103/72 147/102 113/76 156/106  Pulse: 86 74 69 81  Temp: 98.1 F (36.7 C) 98.3 F (36.8 C) 98 F (36.7 C) 98.2 F (36.8 C)  TempSrc: Oral Oral Oral Oral  Resp: 14 14 12 14   Height:      Weight:      SpO2: 98% 100% 100% 100%    Intake/Output Summary (Last 24 hours) at 07/15/13 1805 Last data filed at  07/15/13 1432  Gross per 24 hour  Intake   3439 ml  Output   2150 ml  Net   1289 ml   Filed Weights   07/09/13 1556  Weight: 56.9 kg (125 lb 7.1 oz)    Exam:  General: Alert, awake, oriented x3, in no acute distress. cachectic HEENT: No bruits, no goiter.  Heart: Regular rate and rhythm, without murmurs, rubs, gallops.  Lungs: CTAB, no increased WOB Abdomen: Soft, nondistended, positive bowel sounds.  Mild TTP in epigastrium without rebound or guarding.   Neuro: Grossly intact, nonfocal.   Data Reviewed: Basic Metabolic Panel:  Recent Labs Lab 07/09/13 1200 07/10/13 0430 07/10/13 1109 07/12/13 0353 07/13/13 0405 07/15/13 0425  NA 138 137  --  137 136 136  K 3.4* 3.1*  --  3.0* 3.4* 3.6  CL 101 104  --  105 105 107  CO2 23 21  --  22 19 21   GLUCOSE 88 75  --  92 85 120*  BUN 10 8  --  3* 3* 5*  CREATININE 0.46* 0.44*  --  0.52 0.50 0.52  CALCIUM 9.7 8.8  --  8.3* 8.7 8.6  MG  --   --  1.8  --   --  1.5   Liver Function Tests:  Recent Labs Lab 07/09/13 1200 07/10/13 0430 07/13/13 0405  AST 37 34 22  ALT 20 17 13   ALKPHOS 145* 122* 106  BILITOT 0.5 0.5 0.4  PROT  7.5 6.4 5.9*  ALBUMIN 3.4* 2.8* 2.7*    Recent Labs Lab 07/09/13 1200 07/10/13 0430 07/13/13 0405  LIPASE 92* 84* 47   No results found for this basename: AMMONIA,  in the last 168 hours CBC:  Recent Labs Lab 07/09/13 1200 07/10/13 0430 07/13/13 0405 07/15/13 0425  WBC 2.6* 3.0* 3.7* 3.2*  HGB 7.2* 8.8* 9.0* 8.5*  HCT 23.1* 26.7* 27.3* 25.5*  MCV 62.3* 68.3* 66.6* 67.3*  PLT 226 176 187 163   Cardiac Enzymes: No results found for this basename: CKTOTAL, CKMB, CKMBINDEX, TROPONINI,  in the last 168 hours BNP (last 3 results) No results found for this basename: PROBNP,  in the last 8760 hours CBG: No results found for this basename: GLUCAP,  in the last 168 hours  No results found for this or any previous visit (from the past 240 hour(s)).   Studies: No results  found.  Scheduled Meds: . atovaquone  1,500 mg Oral Q breakfast  . azithromycin  1,200 mg Oral Weekly  . benztropine  2 mg Oral Daily  . darunavir  800 mg Oral Q breakfast  . emtricitabine-tenofovir  1 tablet Oral Q breakfast  . enoxaparin (LOVENOX) injection  40 mg Subcutaneous Q24H  . fluconazole  100 mg Oral Daily  . magnesium sulfate 1 - 4 g bolus IVPB  2 g Intravenous Once  . multivitamin with minerals  1 tablet Oral Daily  . nystatin  400,000 Units Oral QID  . OLANZapine zydis  20 mg Oral Daily  . pantoprazole  40 mg Oral Daily  . ritonavir  100 mg Oral Q breakfast  . valganciclovir  900 mg Oral BID   Continuous Infusions: . sodium chloride 20 mL/hr at 07/15/13 0643  . 0.9 % sodium chloride with kcl 100 mL/hr at 07/14/13 4401     Renae Fickle  [facility] Pager [phone]. If 7PM-7AM, please contact night-coverage at [email or web address], password [facility] 07/15/2013, 6:05 PM  LOS: 6 days

## 2013-07-15 NOTE — Progress Notes (Signed)
NUTRITION FOLLOW UP  Intervention:   - Ensure Complete daily - Recommend MD monitor pt's potassium, magnesium, and phosphorus (and replete as needed) daily for at least 3 days now that pt eating well to monitor for refeeding syndrome - Anti-emetics per MD - Will continue to monitor   Nutrition Dx:   Inadequate oral intake related to inability to eat as evidenced by NPO - no longer appropriate, diet advanced   New nutrition dx: Predicted suboptimal energy intake related to ongoing nausea as evidenced by pt report.   Goal:   1. No further nausea/vomiting - not met, pt with nausea, no further vomiting 2. Advance diet as tolerated to regular diet - met   Monitor:   Weights, labs, intake, nausea  Assessment:   Met with pt who reports eating well on regular diet. Had cereal, milk, sausage, and toast for breakfast and a chicken salad sandwich for lunch. First meal in a long time. Had some vomiting before EGD on 7/29 but none since then. C/o little nausea, notified RN. EGD showed pt with large esophageal ulcer. Potassium and magnesium WNL earlier this morning.   Height: Ht Readings from Last 1 Encounters:  07/09/13 6' (1.829 m)    Weight Status:   Wt Readings from Last 1 Encounters:  07/09/13 125 lb 7.1 oz (56.9 kg)    Re-estimated needs:  Kcal: 1700-1950  Protein: 70-85g  Fluid: 1.7-1.9L/day  Skin: Intact   Diet Order: General   Intake/Output Summary (Last 24 hours) at 07/15/13 1616 Last data filed at 07/15/13 1432  Gross per 24 hour  Intake   3439 ml  Output   2150 ml  Net   1289 ml    Last BM: 7/25   Labs:   Recent Labs Lab 07/10/13 0430 07/10/13 1109 07/12/13 0353 07/13/13 0405 07/15/13 0425  NA 137  --  137 136 136  K 3.1*  --  3.0* 3.4* 3.6  CL 104  --  105 105 107  CO2 21  --  22 19 21   BUN 8  --  3* 3* 5*  CREATININE 0.44*  --  0.52 0.50 0.52  CALCIUM 8.8  --  8.3* 8.7 8.6  MG  --  1.8  --   --  1.5  GLUCOSE 75  --  92 85 120*    CBG (last  3)  No results found for this basename: GLUCAP,  in the last 72 hours  Scheduled Meds: . atovaquone  1,500 mg Oral Q breakfast  . azithromycin  1,200 mg Oral Weekly  . benztropine  2 mg Oral Daily  . darunavir  800 mg Oral Q breakfast  . emtricitabine-tenofovir  1 tablet Oral Q breakfast  . enoxaparin (LOVENOX) injection  40 mg Subcutaneous Q24H  . fluconazole  100 mg Oral Daily  . multivitamin with minerals  1 tablet Oral Daily  . nystatin  400,000 Units Oral QID  . OLANZapine zydis  20 mg Oral Daily  . pantoprazole  40 mg Oral Daily  . ritonavir  100 mg Oral Q breakfast  . valganciclovir  900 mg Oral BID    Continuous Infusions: . sodium chloride 20 mL/hr at 07/15/13 0643  . 0.9 % sodium chloride with kcl 100 mL/hr at 07/14/13 0846     Levon Hedger MS, RD, LDN [phone] Pager [phone] After Hours Pager

## 2013-07-15 NOTE — Progress Notes (Signed)
INFECTIOUS DISEASE PROGRESS NOTE  ID: [name] is a 37 y.o. female with  Principal Problem:   Abdominal pain, epigastric Active Problems:   HIV INFECTION   Microcytic anemia   Bipolar disorder   Hypokalemia   Protein-calorie malnutrition, severe   Follicular adenoma of thyroid gland   Hepatitis B   Elevated lipase   Elevated alkaline phosphatase level   Unintentional weight loss   Dysphagia   Odynophagia  Subjective: Without complaints, ate regular food for lunch. No problem swallowing pills.   Abtx:  Anti-infectives   Start     Dose/Rate Route Frequency Ordered Stop   07/15/13 1000  fluconazole (DIFLUCAN) 40 MG/ML suspension 100 mg     100 mg Oral Daily 07/14/13 1644     07/14/13 1800  ganciclovir (CYTOVENE) 285 mg in sodium chloride 0.9 % 100 mL IVPB     5 mg/kg  56.9 kg 100 mL/hr over 60 Minutes Intravenous Every 12 hours 07/14/13 1644     07/13/13 1900  ritonavir (NORVIR) tablet 100 mg     100 mg Oral Daily with breakfast 07/13/13 1853     07/11/13 1000  fluconazole (DIFLUCAN) IVPB 100 mg  Status:  Discontinued     100 mg 50 mL/hr over 60 Minutes Intravenous Every 24 hours 07/10/13 1109 07/14/13 1644   07/10/13 1600  azithromycin (ZITHROMAX) 200 MG/5ML suspension 1,200 mg     1,200 mg Oral Weekly 07/10/13 1200     07/10/13 1230  atovaquone (MEPRON) 750 MG/5ML suspension 1,500 mg     1,500 mg Oral Daily with breakfast 07/10/13 1124     07/10/13 1200  fluconazole (DIFLUCAN) IVPB 200 mg     200 mg 100 mL/hr over 60 Minutes Intravenous  Once 07/10/13 1109 07/10/13 1520   07/10/13 1200  azithromycin (ZITHROMAX) tablet 500 mg  Status:  Discontinued     500 mg Oral Daily 07/10/13 1124 07/10/13 1200   07/09/13 1800  Darunavir Ethanolate (PREZISTA) tablet 800 mg     800 mg Oral Daily with breakfast 07/09/13 1631     07/09/13 1800  ritonavir (NORVIR) capsule 100 mg  Status:  Discontinued     100 mg Oral Daily with breakfast 07/09/13 1631 07/13/13 1853   07/09/13  1800  emtricitabine-tenofovir (TRUVADA) 200-300 MG per tablet 1 tablet     1 tablet Oral Daily with breakfast 07/09/13 1631        Medications:  Scheduled: . atovaquone  1,500 mg Oral Q breakfast  . azithromycin  1,200 mg Oral Weekly  . benztropine  2 mg Oral Daily  . darunavir  800 mg Oral Q breakfast  . emtricitabine-tenofovir  1 tablet Oral Q breakfast  . enoxaparin (LOVENOX) injection  40 mg Subcutaneous Q24H  . fluconazole  100 mg Oral Daily  . ganciclovir (CYTOVENE) IV  5 mg/kg Intravenous Q12H  . multivitamin with minerals  1 tablet Oral Daily  . nystatin  400,000 Units Oral QID  . OLANZapine zydis  20 mg Oral Daily  . pantoprazole  40 mg Oral Daily  . ritonavir  100 mg Oral Q breakfast    Objective: Vital signs in last 24 hours: Temp:  [98 F (36.7 C)-98.3 F (36.8 C)] 98.2 F (36.8 C) (07/31 0448) Pulse Rate:  [69-81] 81 (07/31 0448) Resp:  [12-14] 14 (07/31 0448) BP: (113-156)/(76-106) 156/106 mmHg (07/31 0448) SpO2:  [100 %] 100 % (07/31 0448)   General appearance: alert and no distress Resp: clear to auscultation bilaterally  Cardio: regular rate and rhythm GI: normal findings: bowel sounds normal and soft, non-tender  Lab Results  Recent Labs  07/13/13 0405 07/15/13 0425  WBC 3.7* 3.2*  HGB 9.0* 8.5*  HCT 27.3* 25.5*  NA 136 136  K 3.4* 3.6  CL 105 107  CO2 19 21  BUN 3* 5*  CREATININE 0.50 0.52   Liver Panel  Recent Labs  07/13/13 0405  PROT 5.9*  ALBUMIN 2.7*  AST 22  ALT 13  ALKPHOS 106  BILITOT 0.4   Sedimentation Rate No results found for this basename: ESRSEDRATE,  in the last 72 hours C-Reactive Protein No results found for this basename: CRP,  in the last 72 hours  Microbiology: No results found for this or any previous visit (from the past 240 hour(s)).  Studies/Results: No results found.   Assessment/Plan: Esophageal Ulcer (5 cm)  AIDS   Total days of antibiotics: none  Diflucan day 4 Change ganciclovir to  valcyte  Await pathology (spoke with path, assured CMV and HSV stains). So far (-) for malignancy.   Azithro/atovaqoune  DRVr/TRV           [name] Infectious Diseases (pager) [phone] [email or web address] 07/15/2013, 12:09 PM  LOS: 6 days

## 2013-07-16 LAB — CBC
HCT: 26.7 % — ABNORMAL LOW (ref 36.0–46.0)
MCH: 22.1 pg — ABNORMAL LOW (ref 26.0–34.0)
MCHC: 33 g/dL (ref 30.0–36.0)
MCV: 67.1 fL — ABNORMAL LOW (ref 78.0–100.0)
RDW: 25 % — ABNORMAL HIGH (ref 11.5–15.5)

## 2013-07-16 LAB — BASIC METABOLIC PANEL
BUN: 6 mg/dL (ref 6–23)
CO2: 26 mEq/L (ref 19–32)
Chloride: 105 mEq/L (ref 96–112)
Creatinine, Ser: 0.55 mg/dL (ref 0.50–1.10)
Glucose, Bld: 87 mg/dL (ref 70–99)

## 2013-07-16 MED ORDER — ATOVAQUONE 750 MG/5ML PO SUSP: 1500.0000 mg | Freq: Every day | ORAL | Status: DC

## 2013-07-16 MED ORDER — OLANZAPINE 20 MG PO TBDP: 20.0000 mg | ORAL_TABLET | Freq: Every day | ORAL | Status: DC

## 2013-07-16 MED ORDER — ENSURE COMPLETE PO LIQD: 237.0000 mL | Freq: Two times a day (BID) | ORAL | Status: DC

## 2013-07-16 MED ORDER — AZITHROMYCIN 200 MG/5ML PO SUSR: 1200.0000 mg | ORAL | Status: DC

## 2013-07-16 MED ORDER — VALGANCICLOVIR HCL 50 MG/ML PO SOLR: 900.0000 mg | Freq: Two times a day (BID) | ORAL | Status: DC

## 2013-07-16 MED ORDER — PROMETHAZINE HCL 12.5 MG PO TABS: 12.5000 mg | ORAL_TABLET | Freq: Four times a day (QID) | ORAL | Status: DC | PRN

## 2013-07-16 MED ORDER — LIDOCAINE VISCOUS 2 % MT SOLN: 15.0000 mL | OROMUCOSAL | Status: DC | PRN

## 2013-07-16 MED ORDER — FLUCONAZOLE 40 MG/ML PO SUSR: 100.0000 mg | Freq: Every day | ORAL | Status: DC

## 2013-07-16 NOTE — Discharge Summary (Signed)
Physician Discharge Summary  [name] AVW:[id number] DOB: 04/13/1976 DOA: 07/09/2013  PCP: Default, Provider, MD  Admit date: 07/09/2013 Discharge date: 07/16/2013  Recommendations for Outpatient Follow-up:  1. Followup with infectious disease within one to 2 weeks of discharge, call to schedule appointment 2. Followup final results of pathology testing for CMV and HSV and pending blood cultures.  Discharge Diagnoses:  Principal Problem:   Abdominal pain, epigastric Active Problems:   HIV INFECTION   Microcytic anemia   Bipolar disorder   Hypokalemia   Protein-calorie malnutrition, severe   Follicular adenoma of thyroid gland   Hepatitis B   Elevated lipase   Elevated alkaline phosphatase level   Unintentional weight loss   Dysphagia   Odynophagia   Discharge Condition: Stable, improved  Diet recommendation: Regular with supplements  Wt Readings from Last 3 Encounters:  07/09/13 56.9 kg (125 lb 7.1 oz)  07/09/13 56.9 kg (125 lb 7.1 oz)  04/16/12 70.308 kg (155 lb)    History of present illness:  37 y/o female with a PMHx of HIV, hepatitis B, acute psychosis and thyroid disease presents to the ED with main concern of progressively worsening RUQ area pain, sharp, radiating to epigastric area and chest, worse with deep inspiration and with no specific alleviating factors, intermittent and 7/10 in severity when present, associated with nausea and poor oral intake. She has been seen in the ED on July 20 and had an abdominal ultrasound on that day which showed gallbladder sludge. The rest of her workup was negative, including CT angiogram, negative cardiac work up. She denies fevers, chills, no specific urinary concerns, no recent sick contacts or exposures, no other systemic concerns, no recent changes in mediations.  In ED, pt with persistent abdominal pain and sludge in gallbladder noted on abdominal US, surgery consulted by ED physician and [facility] asked to admit for further  evaluation   Hospital Course:   The patient was admitted with right upper quadrant and epigastric pain which was initially attributed to possible cholecystis.  She underwent abdominal ultrasound which demonstrated sludge in the gallbladder without gallstones. There was no abnormality on the common bile duct. Because of the persistence of her pain she underwent a HIDA scan which was negative. She was seen by general surgery who recommended against any procedures at this time. Her pain was thought to be due to possible esophagitis. She was started on IV fluconazole for her likely candidal esophagitis however she continued to have severe pain. 2 ischemic after neurology and underwent EGD which demonstrated a 5 cm erosive ulcer in the proximal esophagus. This was the most likely source of her pain. She had significant odynophagia which gradually improved. The biopsy results from her EGD are still pending, particularly the staining for CMV and HSV. She was started empirically on ganciclovir by infectious disease and then transitioned valacyclovir when she was able to tolerate food by mouth. She continue this medication and as he follows up with him in clinic and for a minimum of 3-6 weeks.    AIDS with CD4 count less than 10 and HIV viral blurred of [phone].  She stopped her ART several weeks ago secondary to odynophagia, however she states that she will have been previously compliant for the last year on the same regimen. She is on a BCP I recommend. Infectious disease recommended continuing her current therapy and restarted azithromycin and atovaquone for prophylaxis.    Hypokalemia and hypomagnesemia, likely due to poor oral intake over the last couple  of weeks. She received Ativan IV and oral repletion of these electrolytes and they have normalized.  Bipolar disorder, stable. To continue Zyprexa.  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 Her iron and iron saturations were low  however her TIBC and ferritin were not consistent with iron deficiency. Her folate and B12 levels were also normal. She has fungal blood cultures which are pending at the time of discharge.     Consultants:  surgery Procedures:  none Antibiotics:  azithro  Atovaquone  truvada  preszista Ganciclovir 7/30 >> 7/31 Valcyte 7/31 >>   Discharge Exam: Filed Vitals:   07/16/13 0507  BP: 106/70  Pulse: 71  Temp: 98 F (36.7 C)  Resp: 14   Filed Vitals:   07/14/13 2100 07/15/13 0448 07/15/13 2144 07/16/13 0507  BP: 113/76 156/106 113/77 106/70  Pulse: 69 81 70 71  Temp: 98 F (36.7 C) 98.2 F (36.8 C) 98.2 F (36.8 C) 98 F (36.7 C)  TempSrc: Oral Oral Oral Oral  Resp: 12 14 16 14   Height:      Weight:      SpO2: 100% 100% 100% 100%   Feels well and tolerated food and medications yesterday.   General: Alert, awake, oriented x3, in no acute distress. cachectic  HEENT: No bruits, no goiter. MMM and no ulcers or thrush in mouth. Heart: Regular rate and rhythm, without murmurs, rubs, gallops.  Lungs: CTAB, no increased WOB  Abdomen: Soft, nondistended, positive bowel sounds, nontender. Neuro: Grossly intact, nonfocal.   Discharge Instructions      Discharge Orders   Future Orders Complete By Expires     Call MD for:  difficulty breathing, headache or visual disturbances  As directed     Call MD for:  extreme fatigue  As directed     Call MD for:  hives  As directed     Call MD for:  persistant dizziness or light-headedness  As directed     Call MD for:  persistant nausea and vomiting  As directed     Call MD for:  severe uncontrolled pain  As directed     Call MD for:  temperature >100.4  As directed     Diet general  As directed     Discharge instructions  As directed     Comments:      You were hospitalized with abdominal pain and pain with swallowing and were found to have a large esophageal ulcer.  Your tests for gallbladder infection and stones were negative.   You should take fluconazole and valacyclovir until you follow up with the infectious disease clinic in about 2-3 weeks.  You should also take azithromycin and atovaquone to prevent other infections while your CD4 count is low.  Please continue taking your other medications.  You may use viscous lidocaine for throat pain.  Please use a daily vitamin and drink ensure (or store brand equivalents) to keep up your nutrition.    Increase activity slowly  As directed         Medication List    STOP taking these medications       nystatin 100000 UNIT/ML suspension  Commonly known as:  MYCOSTATIN     oxyCODONE-acetaminophen 5-325 MG per tablet  Commonly known as:  PERCOCET/ROXICET      TAKE these medications       acetaminophen 500 MG tablet  Commonly known as:  TYLENOL  Take 1,000 mg by mouth every 6 (six) hours as  needed for pain.     albuterol 108 (90 BASE) MCG/ACT inhaler  Commonly known as:  PROVENTIL HFA;VENTOLIN HFA  Inhale 2 puffs into the lungs every 6 (six) hours as needed for wheezing.     atovaquone 750 MG/5ML suspension  Commonly known as:  MEPRON  Take 10 mLs (1,500 mg total) by mouth daily with breakfast.     azithromycin 200 MG/5ML suspension  Commonly known as:  ZITHROMAX  Take 30 mLs (1,200 mg total) by mouth once a week.     benztropine 2 MG tablet  Commonly known as:  COGENTIN  Take 2 mg by mouth daily.     darunavir 400 MG tablet  Commonly known as:  PREZISTA  Take 800 mg by mouth daily.     dexlansoprazole 60 MG capsule  Commonly known as:  DEXILANT  Take 60 mg by mouth daily.     emtricitabine-tenofovir 200-300 MG per tablet  Commonly known as:  TRUVADA  Take 1 tablet by mouth daily.     feeding supplement Liqd  Take 237 mLs by mouth 2 (two) times daily between meals.     fluconazole 40 MG/ML suspension  Commonly known as:  DIFLUCAN  Take 2.5 mLs (100 mg total) by mouth daily.     HYDROcodone-acetaminophen 7.5-325 mg/15 ml solution  Commonly  known as:  HYCET  Take 15 mLs by mouth 4 (four) times daily as needed for pain.     iron polysaccharides 150 MG capsule  Commonly known as:  NIFEREX  Take 150 mg by mouth 2 (two) times daily.     lidocaine 2 % solution  Commonly known as:  XYLOCAINE  Take 15 mLs by mouth every 4 (four) hours as needed for pain.     OLANZapine zydis 20 MG disintegrating tablet  Commonly known as:  ZYPREXA  Take 1 tablet (20 mg total) by mouth daily.     ondansetron 4 MG tablet  Commonly known as:  ZOFRAN  Take 1 tablet (4 mg total) by mouth every 8 (eight) hours as needed for nausea.     promethazine 12.5 MG tablet  Commonly known as:  PHENERGAN  Take 1 tablet (12.5 mg total) by mouth every 6 (six) hours as needed for nausea.     ritonavir 100 MG capsule  Commonly known as:  NORVIR  Take 100 mg by mouth daily.     valganciclovir 50 MG/ML Solr  Commonly known as:  VALCYTE  Take 18 mLs (900 mg total) by mouth 2 (two) times daily.     WOMENS MULTI VITAMIN & MINERAL PO  Take 2 each by mouth daily. Gummy vitamins       Follow-up Information   Follow up with Default, Provider, MD. Schedule an appointment as soon as possible for a visit in 1 month.   Contact information:   [address] [place] [address] [phone]       Follow up with [name], MD. Schedule an appointment as soon as possible for a visit in 3 weeks.   Contact information:   [address]  [address] [place] [address] [phone]        The results of significant diagnostics from this hospitalization (including imaging, microbiology, ancillary and laboratory) are listed below for reference.    Significant Diagnostic Studies: Dg Chest 2 View  06/18/2013   *RADIOLOGY REPORT*  Clinical Data: Chest pain and shortness of breath  CHEST - 2 VIEW  Comparison: 03/30/2013  Findings: The heart size and mediastinal contours are within normal limits.  Both lungs are clear.  The visualized  skeletal structures are unremarkable.  IMPRESSION: Negative exam.   Original Report Authenticated By: Signa Kell, M.D.   Ct Angio Chest W/cm &/or Wo Cm  07/04/2013   *RADIOLOGY REPORT*  Clinical Data: For several months, getting worse more recently.  CT ANGIOGRAPHY CHEST  Technique:  Multidetector CT imaging of the chest using the standard protocol during bolus administration of intravenous contrast. Multiplanar reconstructed images including MIPs were obtained and reviewed to evaluate the vascular anatomy.  Contrast: OMNIPAQUE IOHEXOL 350 MG/ML SOLN  Comparison: Chest radiograph, 06/18/2013.  Findings: There is no evidence of a pulmonary embolus.  The heart and great vessels are within normal limits.  No mediastinal or hilar masses or adenopathy.  The esophagus appears mildly thick-walled from the level of the carina to the gastroesophageal junction. This may reflect esophagitis.  No pleural effusion or pneumothorax.  The lungs are clear.  There is been previous left thyroidectomy.  Limited evaluation of the upper abdomen is unremarkable.  No bony abnormality.  IMPRESSION: No evidence of a pulmonary embolus.  Clear lungs.  Mild thickening of the wall of the distal esophagus, which is consistent with esophagitis in the proper clinical setting.  Status post left thyroidectomy.  No other abnormalities.   Original Report Authenticated By: Amie Portland, M.D.   Nm Hepatobiliary  07/12/2013   *RADIOLOGY REPORT*  Clinical Data: Right upper quadrant pain, nausea.  NUCLEAR MEDICINE HEPATOHBILIARY INCLUDE GB  Radiopharmaceutical:  5.4 mCi technetium 96m MDP IV.  Comparison: Ultrasound 07/09/2013  Findings: Prompt uptake and excretion of radiotracer by the liver. Activity seen within the gallbladder at 10-15 minutes and small bowel at 35 minutes.  IMPRESSION: No evidence of cystic duct or common bile duct obstruction.   Original Report Authenticated By: Charlett Nose, M.D.   US Abdomen Complete  07/09/2013    *RADIOLOGY REPORT*  Abdominal ultrasound  History:  Upper abdominal pain  Comparison:  July 04, 2013  Findings:  Gallbladder is visualized in multiple projections. Sludge is again noted in the gallbladder, unchanged.  There are no echogenic foci in the gallbladder which move and shadow as is expected with gallstones.  There is no gallbladder wall thickening or pericholecystic fluid collection.  There is no intrahepatic, common hepatic, or common bile duct dilatation.  Pancreas appears normal.  No focal liver lesions are identified.  There are small calcified granulomas in the spleen.  Spleen otherwise appears normal. Kidneys bilaterally appear normal.  There is no ascites.  Aorta is nonaneurysmal.  Inferior vena cava appears normal.  Conclusion:  Sludge in gallbladder but no gallstones.  This appearance is stable compared to recent prior study.  There are small splenic granulomas.  Study otherwise within normal limits.   Original Report Authenticated By: Bretta Bang, M.D.   US Abdomen Complete  07/04/2013   *RADIOLOGY REPORT*  Clinical Data:  Upper abdominal pain and vomiting  ABDOMINAL ULTRASOUND COMPLETE  Comparison:  06/11/2010  Findings:  Gallbladder:  No gallbladder wall thickening or pericholecystic fluid.  Gallbladder sludge but no stones noted.  Negative sonographic Murphy's sign.  Common Bile Duct:  Within normal limits in caliber.  Liver: No focal mass lesion identified.  Within normal limits in parenchymal echogenicity.  IVC:  Appears normal.  Pancreas:  No abnormality identified.  Spleen:  Normal in size.  Contains multiple granulomas.  Right kidney:  Normal in size and  parenchymal echogenicity.  No evidence of mass or hydronephrosis.  Left kidney:  Normal in size and parenchymal echogenicity.  No evidence of mass or hydronephrosis.  Abdominal Aorta:  No aneurysm identified.  IMPRESSION:  1.  Gallbladder sludge. 2.  Splenic granulomas.   Original Report Authenticated By: Signa Kell, M.D.     Microbiology: Recent Results (from the past 240 hour(s))  CULTURE, BLOOD (ROUTINE X 2)     Status: None   Collection Time    07/14/13  3:05 PM      Result Value Range Status   Specimen Description BLOOD RIGHT ARM   Final   Special Requests BOTTLES DRAWN AEROBIC ONLY 10CC   Final   Culture  Setup Time 07/15/2013 03:29   Final   Culture     Final   Value:        BLOOD CULTURE RECEIVED NO GROWTH TO DATE CULTURE WILL BE HELD FOR 5 DAYS BEFORE ISSUING A FINAL NEGATIVE REPORT   Report Status PENDING   Incomplete  CULTURE, BLOOD (ROUTINE X 2)     Status: None   Collection Time    07/14/13  3:20 PM      Result Value Range Status   Specimen Description BLOOD LEFT HAND   Final   Special Requests BOTTLES DRAWN AEROBIC ONLY 3CC   Final   Culture  Setup Time 07/15/2013 03:29   Final   Culture     Final   Value:        BLOOD CULTURE RECEIVED NO GROWTH TO DATE CULTURE WILL BE HELD FOR 5 DAYS BEFORE ISSUING A FINAL NEGATIVE REPORT   Report Status PENDING   Incomplete     Labs: Basic Metabolic Panel:  Recent Labs Lab 07/10/13 0430 07/10/13 1109 07/12/13 0353 07/13/13 0405 07/15/13 0425 07/16/13 0417  NA 137  --  137 136 136 137  K 3.1*  --  3.0* 3.4* 3.6 3.5  CL 104  --  105 105 107 105  CO2 21  --  22 19 21 26   GLUCOSE 75  --  92 85 120* 87  BUN 8  --  3* 3* 5* 6  CREATININE 0.44*  --  0.52 0.50 0.52 0.55  CALCIUM 8.8  --  8.3* 8.7 8.6 8.5  MG  --  1.8  --   --  1.5 1.9  PHOS  --   --   --   --   --  2.6   Liver Function Tests:  Recent Labs Lab 07/09/13 1200 07/10/13 0430 07/13/13 0405  AST 37 34 22  ALT 20 17 13   ALKPHOS 145* 122* 106  BILITOT 0.5 0.5 0.4  PROT 7.5 6.4 5.9*  ALBUMIN 3.4* 2.8* 2.7*    Recent Labs Lab 07/09/13 1200 07/10/13 0430 07/13/13 0405  LIPASE 92* 84* 47   No results found for this basename: AMMONIA,  in the last 168 hours CBC:  Recent Labs Lab 07/09/13 1200 07/10/13 0430 07/13/13 0405 07/15/13 0425 07/16/13 0417  WBC 2.6*  3.0* 3.7* 3.2* 3.6*  HGB 7.2* 8.8* 9.0* 8.5* 8.8*  HCT 23.1* 26.7* 27.3* 25.5* 26.7*  MCV 62.3* 68.3* 66.6* 67.3* 67.1*  PLT 226 176 187 163 173   Cardiac Enzymes: No results found for this basename: CKTOTAL, CKMB, CKMBINDEX, TROPONINI,  in the last 168 hours BNP: BNP (last 3 results) No results found for this basename: PROBNP,  in the last 8760 hours CBG: No results found for this basename: GLUCAP,  in the  last 168 hours  Time coordinating discharge: 45 minutes  Signed:  Karliah Kowalchuk  [facility] 07/16/2013, 9:58 AM

## 2013-07-16 NOTE — Progress Notes (Signed)
Pt to d/c home. AVS reviewed and "My Chart" discussed with pt. Pt capable of verbalizing medications and follow-up appointments. Remains hemodynamically stable. No signs and symptoms of distress. Educated pt to return to ER in the case of SOB, dizziness, or chest pain.  

## 2013-07-18 ENCOUNTER — Telehealth: Payer: Self-pay | Admitting: Internal Medicine

## 2013-07-18 NOTE — Telephone Encounter (Signed)
Discussed results of pathology report with negative stains for HSV and CMV.  Dr. Luciana Axe recommended continuing therapy with valcyte because this may not have been representative sampling of the ulcer.

## 2013-07-19 ENCOUNTER — Telehealth: Payer: Self-pay | Admitting: *Deleted

## 2013-07-19 ENCOUNTER — Encounter: Payer: Self-pay | Admitting: *Deleted

## 2013-07-19 NOTE — Telephone Encounter (Signed)
Left message for pt.  Given appt per Dr. Orvan Falconer for Monday, Aug. 11, 2014 @ 4:15 PM.  Will also send the pt a letter with this information.

## 2013-07-21 LAB — CULTURE, BLOOD (ROUTINE X 2): Culture: NO GROWTH

## 2013-07-26 ENCOUNTER — Ambulatory Visit: Payer: Medicaid Other | Admitting: Internal Medicine

## 2013-08-10 ENCOUNTER — Ambulatory Visit (INDEPENDENT_AMBULATORY_CARE_PROVIDER_SITE_OTHER): Payer: Medicaid Other | Admitting: Internal Medicine

## 2013-08-10 ENCOUNTER — Telehealth: Payer: Self-pay | Admitting: *Deleted

## 2013-08-10 ENCOUNTER — Encounter: Payer: Self-pay | Admitting: Internal Medicine

## 2013-08-10 ENCOUNTER — Other Ambulatory Visit: Payer: Self-pay | Admitting: *Deleted

## 2013-08-10 VITALS — BP 114/79 | HR 81 | Temp 98.3°F | Ht <= 58 in | Wt 138.5 lb

## 2013-08-10 DIAGNOSIS — B20 Human immunodeficiency virus [HIV] disease: Secondary | ICD-10-CM

## 2013-08-10 DIAGNOSIS — Z23 Encounter for immunization: Secondary | ICD-10-CM

## 2013-08-10 MED ORDER — RITONAVIR 100 MG PO TABS: 100.0000 mg | ORAL_TABLET | Freq: Every day | ORAL | Status: DC

## 2013-08-10 MED ORDER — AZITHROMYCIN 600 MG PO TABS: 1200.0000 mg | ORAL_TABLET | ORAL | Status: DC

## 2013-08-10 MED ORDER — ATOVAQUONE 750 MG/5ML PO SUSP: 1500.0000 mg | Freq: Every day | ORAL | Status: DC

## 2013-08-10 MED ORDER — FLUCONAZOLE 100 MG PO TABS: 100.0000 mg | ORAL_TABLET | ORAL | Status: DC

## 2013-08-10 MED ORDER — EMTRICITABINE-TENOFOVIR DF 200-300 MG PO TABS: 1.0000 | ORAL_TABLET | Freq: Every day | ORAL | Status: DC

## 2013-08-10 MED ORDER — DARUNAVIR ETHANOLATE 800 MG PO TABS: 800.0000 mg | ORAL_TABLET | Freq: Every day | ORAL | Status: DC

## 2013-08-10 NOTE — Telephone Encounter (Signed)
[employer] and got an emergency supply of Truvada for [name].  Gave information to [employer].

## 2013-08-10 NOTE — Progress Notes (Signed)
Patient ID: [name], female   DOB: [birth year], 37 y.o.   MRN: [id number]          [facility] for Infectious Disease  Patient Active Problem List   Diagnosis Date Noted  . Unintentional weight loss 07/10/2013    Priority: High  . Esophageal ulcer 07/10/2013    Priority: High  . Bipolar disorder 04/01/2012    Priority: High  . HIV INFECTION 02/28/2009    Priority: High  . 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 Hepatitis B 07/10/2013  . Elevated lipase 07/10/2013  . Elevated alkaline phosphatase level 07/10/2013  . GENITAL HERPES 02/28/2009  . Microcytic anemia 02/27/2009    Patient's Medications  New Prescriptions   AZITHROMYCIN (ZITHROMAX) 600 MG TABLET    Take 2 tablets (1,200 mg total) by mouth every 7 (seven) days.   FLUCONAZOLE (DIFLUCAN) 100 MG TABLET    Take 1 tablet (100 mg total) by mouth once a week.  Previous Medications   ACETAMINOPHEN (TYLENOL) 500 MG TABLET    Take 1,000 mg by mouth every 6 (six) hours as needed for pain.   ALBUTEROL (PROVENTIL HFA;VENTOLIN HFA) 108 (90 BASE) MCG/ACT INHALER    Inhale 2 puffs into the lungs every 6 (six) hours as needed for wheezing.   BENZTROPINE (COGENTIN) 2 MG TABLET    Take 2 mg by mouth daily.   DARUNAVIR (PREZISTA) 400 MG TABLET    Take 800 mg by mouth daily.   DEXLANSOPRAZOLE (DEXILANT) 60 MG CAPSULE    Take 60 mg by mouth daily.   EMTRICITABINE-TENOFOVIR (TRUVADA) 200-300 MG PER TABLET    Take 1 tablet by mouth daily.   FEEDING SUPPLEMENT (ENSURE COMPLETE) LIQD    Take 237 mLs by mouth 2 (two) times daily between meals.   IRON POLYSACCHARIDES (NIFEREX) 150 MG CAPSULE    Take 150 mg by mouth 2 (two) times daily.   MULTIPLE VITAMINS-MINERALS (WOMENS MULTI VITAMIN & MINERAL PO)    Take 2 each by mouth daily. Gummy vitamins   OLANZAPINE ZYDIS (ZYPREXA) 20 MG DISINTEGRATING TABLET    Take 1 tablet (20 mg total) by mouth daily.   ONDANSETRON (ZOFRAN) 4 MG TABLET    Take 1 tablet (4 mg total) by mouth every  8 (eight) hours as needed for nausea.   PROMETHAZINE (PHENERGAN) 12.5 MG TABLET    Take 1 tablet (12.5 mg total) by mouth every 6 (six) hours as needed for nausea.   RITONAVIR (NORVIR) 100 MG CAPSULE    Take 100 mg by mouth daily.   VALGANCICLOVIR (VALCYTE) 50 MG/ML SOLR    Take 18 mLs (900 mg total) by mouth 2 (two) times daily.  Modified Medications   Modified Medication Previous Medication   ATOVAQUONE (MEPRON) 750 MG/5ML SUSPENSION atovaquone (MEPRON) 750 MG/5ML suspension      Take 10 mLs (1,500 mg total) by mouth daily with breakfast.    Take 10 mLs (1,500 mg total) by mouth daily with breakfast.  Discontinued Medications   AZITHROMYCIN (ZITHROMAX) 200 MG/5ML SUSPENSION    Take 30 mLs (1,200 mg total) by mouth once a week.   FLUCONAZOLE (DIFLUCAN) 40 MG/ML SUSPENSION    Take 2.5 mLs (100 mg total) by mouth daily.   HYDROCODONE-ACETAMINOPHEN (HYCET) 7.5-325 MG/15 ML SOLUTION    Take 15 mLs by mouth 4 (four) times daily as needed for pain.   LIDOCAINE (XYLOCAINE) 2 % SOLUTION    Take 15 mLs by mouth every 4 (four) hours as needed for  pain.    Subjective: [name] is in for her hospital followup visit. She stop taking her antiretroviral medication several months ago thinking that she was doing okay and then begin to lose weight rapidly. She developed abdominal pain and pain on swallowing and was hospitalized with significant weight loss and malnutrition. He was found to have a 25 mm esophageal ulcer. Stains for HSV and CMV and fungus were negative. There was no evidence of malignancy. She was put on a proton pump inhibitor and has improved. She does not have any problems swallowing her pills. After discharge she did restart Truvada, Prezista and Norvir year medication she had at home. She has one more week before she runs out. She has not been in mental health counseling and is not on her antidepressants. She says that she is interested in getting back into care. Review of  Systems: Constitutional: positive for anorexia and weight loss, negative for chills, fevers and sweats Eyes: negative Ears, nose, mouth, throat, and face: negative Respiratory: negative Cardiovascular: negative Gastrointestinal: positive for abdominal pain, dyspepsia, dysphagia, odynophagia and reflux symptoms, negative for diarrhea, nausea and vomiting Genitourinary:negative Behavioral/Psych: positive for depression, negative for anxiety  Past Medical History  Diagnosis Date  . HIV positive   . Hepatitis B     /E-chart  . Microcytic anemia     h/o per E-chart  . 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 Noncompliance with medication regimen     /e-chart  . 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 Pyelonephritis     h/o per E-chart  . Thyroid disease     History  Substance Use Topics  . Smoking status: Never Smoker   . Smokeless tobacco: Never Used  . Alcohol Use: No    No family history on file.  Allergies  Allergen Reactions  . Bactrim Itching  . Dapsone Itching  . Orange Fruit Itching  . Peanut-Containing Drug Products Hives  . Shellfish Allergy Hives    Objective: Temp: 98.3 F (36.8 C) (08/26 0849) Temp src: Oral (08/26 0849) BP: 114/79 mmHg (08/26 0849) Pulse Rate: 81 (08/26 0849)  General: She is looking much better than when she was in the hospital. Her spirits are much improved but she is tearful during the exam Oral: No oropharyngeal lesions Skin: No rash Lungs: Clear Cor: Regular S1 and S2 with no murmurs Abdomen: Soft and nontender Joints and extremities: Normal Neuro: Alert with normal speech and conversation  Lab Results HIV 1 RNA Quant (copies/mL)  Date Value  07/10/2013 [id number]*  07/28/2012 [id number]*  03/23/2012 3824*     CD4 T Cell Abs (cmm)  Date Value  07/10/2013 <10*  07/28/2012 <10*  03/23/2012 <10*     Lab Results  Component Value Date   WBC 3.6* 07/16/2013    HGB 8.8* 07/16/2013   HCT 26.7* 07/16/2013   MCV 67.1* 07/16/2013   PLT 173 07/16/2013   BMET    Component Value Date/Time   NA 137 07/16/2013 0417   K 3.5 07/16/2013 0417   CL 105 07/16/2013 0417   CO2 26 07/16/2013 0417   GLUCOSE 87 07/16/2013 0417   GLUCOSE 93 06/11/2010   BUN 6 07/16/2013 0417   CREATININE 0.55 07/16/2013 0417   CREATININE 0.68 07/28/2012 1234   CALCIUM 8.5 07/16/2013 0417   GFRNONAA >90 07/16/2013 0417   GFRAA >90 07/16/2013 0417   Lab Results  Component Value Date  ALT 13 07/13/2013   AST 22 07/13/2013   ALKPHOS 106 07/13/2013   BILITOT 0.4 07/13/2013   Assessment: Not surprisingly, her HIV infection is out of control after being off of her antiretroviral medication for several months. She will need to get approval for ADAP so she can get a steady supply of her medications.  She appears to have developed an esophageal ulcer secondary to reflux esophagitis. It has improved with proton pump inhibitor therapy.  She is agreeable to getting into counseling for her depression.  Plan: 1. Continue Truvada, Prezista and Norvir 2. ADAP application 3. Mental health counseling hear in clinic   Cliffton Asters, MD [facility] [phone] pager   [phone] cell 08/10/2013, 10:27 AM

## 2013-08-10 NOTE — Telephone Encounter (Signed)
Pt will pick up co-pay information from [facility] and take to [facility], [address] and [employer] for a one-month supply of Truvada.

## 2013-08-10 NOTE — Progress Notes (Signed)
HPI: [name] is a 37 y.o. female here to re-establish care at the [facility] after a recent hospitalization for esophagitis.  Allergies: Allergies  Allergen Reactions  . Bactrim Itching  . Dapsone Itching  . Orange Fruit Itching  . Peanut-Containing Drug Products Hives  . Shellfish Allergy Hives    Vitals: Temp: 98.3 F (36.8 C) (08/26 0849) Temp src: Oral (08/26 0849) BP: 114/79 mmHg (08/26 0849) Pulse Rate: 81 (08/26 0849)  Past Medical History: Past Medical History  Diagnosis Date  . HIV positive   . Hepatitis B     /E-chart  . Microcytic anemia     h/o per E-chart  . 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 Noncompliance with medication regimen     /e-chart  . 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 Pyelonephritis     h/o per E-chart  . Thyroid disease     Social History: History   Social History  . Marital Status: Single    Spouse Name: N/A    Number of Children: N/A  . Years of Education: N/A   Social History Main Topics  . Smoking status: Never Smoker   . Smokeless tobacco: Never Used  . Alcohol Use: No  . Drug Use: No  . Sexual Activity: No     Comment: declined condoms   Other Topics Concern  . Not on file   Social History Narrative  . No narrative on file    Current Regimen: Darunavir/ritonavir, Tenofovir/Emtriciatbine.  Reports no missed doses in the past week.  Labs: HIV 1 RNA Quant (copies/mL)  Date Value  07/10/2013 [id number]*  07/28/2012 [id number]*  03/23/2012 3824*     CD4 T Cell Abs (cmm)  Date Value  07/10/2013 <10*  07/28/2012 <10*  03/23/2012 <10*     Hep B S Ab (no units)  Date Value  02/09/2007 No      Hepatitis B Surface Ag (no units)  Date Value  02/09/2007 No      HCV Ab (no units)  Date Value  02/09/2007 No     CrCl: Estimated Creatinine Clearance: -33.9 ml/min (by C-G formula based on Cr of 0.55).  Lipids:    Component Value Date/Time    CHOL 164 03/23/2012 1019   TRIG 114 03/23/2012 1019   HDL 49 03/23/2012 1019   CHOLHDL 3.3 03/23/2012 1019   VLDL 23 03/23/2012 1019   LDLCALC 92 03/23/2012 1019    Assessment: HIV - she seems committed to re-establishing care, and reports adherence with her HAART after hospital discharge.  She was unable to fill adequate supplies of Atovaquone and Azithromycin due to financial constraints.  Recommendations: Continue Darunavir/ritonavir, Tenofovir/Emtricitabine Adherence counseling was performed, and her current adherence was praised. I provided her with a larger pill box per her request. Would change Azithromycin to tablets as her odynophagia has improved.  She is agreeable to this change. Follow-up with financial counseling to establish resources for her HAART, Atovaquone, and Azithromycin.   Sallee Provencal, PharmD, BCPS, AAHIVP Clinical Infectious Disease Pharmacist [facility] 08/10/2013, 9:11 AM

## 2013-08-11 ENCOUNTER — Emergency Department (HOSPITAL_COMMUNITY): Payer: Medicaid Other

## 2013-08-11 ENCOUNTER — Emergency Department (HOSPITAL_COMMUNITY)
Admission: EM | Admit: 2013-08-11 | Discharge: 2013-08-11 | Disposition: A | Discharge status: Home / Self Care | Payer: Medicaid Other | Attending: Emergency Medicine | Admitting: Emergency Medicine

## 2013-08-11 ENCOUNTER — Encounter (HOSPITAL_COMMUNITY): Payer: Self-pay | Admitting: Emergency Medicine

## 2013-08-11 DIAGNOSIS — Z8619 Personal history of other infectious and parasitic diseases: Secondary | ICD-10-CM | POA: Insufficient documentation

## 2013-08-11 DIAGNOSIS — Z21 Asymptomatic human immunodeficiency virus [HIV] infection status: Secondary | ICD-10-CM | POA: Insufficient documentation

## 2013-08-11 DIAGNOSIS — Z9851 Tubal ligation status: Secondary | ICD-10-CM | POA: Insufficient documentation

## 2013-08-11 DIAGNOSIS — Z8639 Personal history of other endocrine, nutritional and metabolic disease: Secondary | ICD-10-CM | POA: Insufficient documentation

## 2013-08-11 DIAGNOSIS — Z91199 Patient's noncompliance with other medical treatment and regimen due to unspecified reason: Secondary | ICD-10-CM | POA: Insufficient documentation

## 2013-08-11 DIAGNOSIS — R05 Cough: Secondary | ICD-10-CM | POA: Insufficient documentation

## 2013-08-11 DIAGNOSIS — Z79899 Other long term (current) drug therapy: Secondary | ICD-10-CM | POA: Insufficient documentation

## 2013-08-11 DIAGNOSIS — Z8701 Personal history of pneumonia (recurrent): Secondary | ICD-10-CM | POA: Insufficient documentation

## 2013-08-11 DIAGNOSIS — Z87448 Personal history of other diseases of urinary system: Secondary | ICD-10-CM | POA: Insufficient documentation

## 2013-08-11 DIAGNOSIS — Z862 Personal history of diseases of the blood and blood-forming organs and certain disorders involving the immune mechanism: Secondary | ICD-10-CM | POA: Insufficient documentation

## 2013-08-11 DIAGNOSIS — Z3202 Encounter for pregnancy test, result negative: Secondary | ICD-10-CM | POA: Insufficient documentation

## 2013-08-11 DIAGNOSIS — R059 Cough, unspecified: Secondary | ICD-10-CM | POA: Insufficient documentation

## 2013-08-11 DIAGNOSIS — R509 Fever, unspecified: Secondary | ICD-10-CM | POA: Insufficient documentation

## 2013-08-11 DIAGNOSIS — Z8659 Personal history of other mental and behavioral disorders: Secondary | ICD-10-CM | POA: Insufficient documentation

## 2013-08-11 DIAGNOSIS — D509 Iron deficiency anemia, unspecified: Secondary | ICD-10-CM | POA: Insufficient documentation

## 2013-08-11 DIAGNOSIS — R1011 Right upper quadrant pain: Secondary | ICD-10-CM | POA: Insufficient documentation

## 2013-08-11 DIAGNOSIS — R197 Diarrhea, unspecified: Secondary | ICD-10-CM | POA: Insufficient documentation

## 2013-08-11 DIAGNOSIS — Z9119 Patient's noncompliance with other medical treatment and regimen: Secondary | ICD-10-CM | POA: Insufficient documentation

## 2013-08-11 DIAGNOSIS — R112 Nausea with vomiting, unspecified: Secondary | ICD-10-CM | POA: Insufficient documentation

## 2013-08-11 LAB — CBC WITH DIFFERENTIAL/PLATELET
Basophils Absolute: 0 10*3/uL (ref 0.0–0.1)
HCT: 31.3 % — ABNORMAL LOW (ref 36.0–46.0)
Lymphocytes Relative: 25 % (ref 12–46)
Monocytes Relative: 3 % (ref 3–12)
Platelets: 175 10*3/uL (ref 150–400)
RBC: 4.23 MIL/uL (ref 3.87–5.11)
RDW: 25.9 % — ABNORMAL HIGH (ref 11.5–15.5)
WBC: 4.5 10*3/uL (ref 4.0–10.5)

## 2013-08-11 LAB — URINALYSIS, ROUTINE W REFLEX MICROSCOPIC
Bilirubin Urine: NEGATIVE
Nitrite: NEGATIVE
Specific Gravity, Urine: 1.016 (ref 1.005–1.030)
pH: 6.5 (ref 5.0–8.0)

## 2013-08-11 LAB — T-HELPER CELL (CD4) - (RCID CLINIC ONLY): CD4 % Helper T Cell: 1 % — ABNORMAL LOW (ref 33–55)

## 2013-08-11 LAB — COMPREHENSIVE METABOLIC PANEL
ALT: 16 U/L (ref 0–35)
AST: 38 U/L — ABNORMAL HIGH (ref 0–37)
Albumin: 3.4 g/dL — ABNORMAL LOW (ref 3.5–5.2)
Alkaline Phosphatase: 80 U/L (ref 39–117)
Potassium: 3.8 mEq/L (ref 3.5–5.1)
Sodium: 131 mEq/L — ABNORMAL LOW (ref 135–145)
Total Protein: 7.5 g/dL (ref 6.0–8.3)

## 2013-08-11 LAB — URINE MICROSCOPIC-ADD ON

## 2013-08-11 MED ORDER — ACETAMINOPHEN 325 MG PO TABS
650.0000 mg | ORAL_TABLET | Freq: Once | ORAL | Status: AC
  Administered 2013-08-11: 650 mg via ORAL
  Filled 2013-08-11: qty 2

## 2013-08-11 MED ORDER — SODIUM CHLORIDE 0.9 % IV BOLUS (SEPSIS)
1000.0000 mL | Freq: Once | INTRAVENOUS | Status: AC
  Administered 2013-08-11: 1000 mL via INTRAVENOUS

## 2013-08-11 MED ORDER — ONDANSETRON HCL 4 MG PO TABS: 4.0000 mg | ORAL_TABLET | Freq: Four times a day (QID) | ORAL | Status: DC

## 2013-08-11 NOTE — ED Notes (Signed)
Pt states that she has been having NVD since yesterday.  States she has been having fevers.  Denies any pain, just states she "feels bad and lightheaded".

## 2013-08-11 NOTE — ED Provider Notes (Signed)
CSN: [id number]     Arrival date & time 08/11/13  0711 History   First MD Initiated Contact with Patient 08/11/13 0725     Chief Complaint  Patient presents with  . Nausea  . Emesis  . Diarrhea   (Consider location/radiation/quality/duration/timing/severity/associated sxs/prior Treatment) Patient is a 37 y.o. female presenting with vomiting and diarrhea. The history is provided by the patient. No language interpreter was used.  Emesis Severity:  Moderate Duration:  1 day Timing:  Intermittent Number of daily episodes:  2 Quality:  Stomach contents Progression:  Unchanged Chronicity:  New Recent urination:  Normal Context: not post-tussive and not self-induced   Relieved by:  Nothing Worsened by:  Liquids and food smell Ineffective treatments:  None tried Associated symptoms: cough, diarrhea and fever   Associated symptoms: no abdominal pain, no arthralgias, no chills, no headaches, no myalgias and no sore throat   Diarrhea:    Quality:  Watery   Number of occurrences:  2   Severity:  Moderate   Duration:  1 day   Timing:  Intermittent   Progression:  Unchanged Fever:    Duration:  1 day   Timing:  Intermittent   Max temp PTA (F):  101.5   Temp source:  Oral Risk factors: no prior abdominal surgery, no sick contacts, no suspect food intake and no travel to endemic areas   Risk factors comment:  Recent abx use Diarrhea Associated symptoms: cough and vomiting   Associated symptoms: no abdominal pain, no arthralgias, no chills, no diaphoresis, no fever, no headaches and no myalgias     Past Medical History  Diagnosis Date  . HIV positive   . Hepatitis B     /E-chart  . Microcytic anemia     h/o per E-chart  . 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 Noncompliance with medication regimen     /e-chart  . 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 Pyelonephritis     h/o per E-chart  . Thyroid  disease    Past Surgical History  Procedure Laterality Date  . Thyroid surgery    . Thyroid lobectomy  08/2002    left & isthmectomy; for benign thyroid adenoma/E-chart  . Tubal ligation  07/2002    /E-chart  . Esophagogastroduodenoscopy N/A 07/13/2013    Procedure: ESOPHAGOGASTRODUODENOSCOPY (EGD);  Surgeon: Theda Belfast, MD;  Location: [facility];  Service: Endoscopy;  Laterality: N/A;   No family history on file. History  Substance Use Topics  . Smoking status: Never Smoker   . Smokeless tobacco: Never Used  . Alcohol Use: No   OB History   Grav Para Term Preterm Abortions TAB SAB Ect Mult Living                 Review of Systems  Constitutional: Negative for fever, chills, diaphoresis, activity change, appetite change and fatigue.  HENT: Negative for congestion, sore throat, facial swelling, rhinorrhea, neck pain and neck stiffness.   Eyes: Negative for photophobia and discharge.  Respiratory: Negative for cough, chest tightness and shortness of breath.   Cardiovascular: Negative for chest pain, palpitations and leg swelling.  Gastrointestinal: Positive for nausea, vomiting and diarrhea. Negative for abdominal pain.  Endocrine: Negative for polydipsia and polyuria.  Genitourinary: Negative for dysuria, frequency, difficulty urinating and pelvic pain.  Musculoskeletal: Negative for myalgias, back pain and arthralgias.  Skin: Negative for color change and wound.  Allergic/Immunologic: Negative for immunocompromised state.  Neurological: Negative for facial asymmetry, weakness, numbness and headaches.  Hematological: Does not bruise/bleed easily.  Psychiatric/Behavioral: Negative for confusion and agitation.    Allergies  Bactrim; Dapsone; Orange fruit; Peanut-containing drug products; and Shellfish allergy  Home Medications   Current Outpatient Rx  Name  Route  Sig  Dispense  Refill  . acetaminophen (TYLENOL) 500 MG tablet   Oral   Take 1,000 mg by mouth every 6  (six) hours as needed for pain.         Marland Kitchen albuterol (PROVENTIL HFA;VENTOLIN HFA) 108 (90 BASE) MCG/ACT inhaler   Inhalation   Inhale 2 puffs into the lungs every 6 (six) hours as needed for wheezing.         . benztropine (COGENTIN) 2 MG tablet   Oral   Take 2 mg by mouth daily.         . Darunavir Ethanolate (PREZISTA) 800 MG tablet   Oral   Take 800 mg by mouth daily with breakfast.         . dexlansoprazole (DEXILANT) 60 MG capsule   Oral   Take 60 mg by mouth daily.         Marland Kitchen emtricitabine-tenofovir (TRUVADA) 200-300 MG per tablet   Oral   Take 1 tablet by mouth daily.         Marland Kitchen ENSURE PLUS (ENSURE PLUS) LIQD   Oral   Take 237 mLs by mouth 2 (two) times daily between meals.         . fluconazole (DIFLUCAN) 100 MG tablet   Oral   Take 100 mg by mouth once a week.         . iron polysaccharides (NIFEREX) 150 MG capsule   Oral   Take 150 mg by mouth 2 (two) times daily.         . Multiple Vitamins-Minerals (WOMENS MULTI VITAMIN & MINERAL PO)   Oral   Take 2 each by mouth daily. Gummy vitamins         . OLANZapine zydis (ZYPREXA) 20 MG disintegrating tablet   Oral   Take 20 mg by mouth at bedtime.         . ondansetron (ZOFRAN) 4 MG tablet   Oral   Take 4 mg by mouth every 8 (eight) hours as needed for nausea.         . promethazine (PHENERGAN) 12.5 MG tablet   Oral   Take 12.5 mg by mouth every 6 (six) hours as needed for nausea.         . ritonavir (NORVIR) 100 MG TABS tablet   Oral   Take 100 mg by mouth daily with breakfast.         . ondansetron (ZOFRAN) 4 MG tablet   Oral   Take 1 tablet (4 mg total) by mouth every 6 (six) hours.   12 tablet   0    BP 107/65  Pulse 73  Temp(Src) 98.5 F (36.9 C) (Oral)  Resp 16  SpO2 100%  LMP 08/04/2013 Physical Exam  Constitutional: She is oriented to person, place, and time. She appears well-developed and well-nourished. No distress.  HENT:  Head: Normocephalic and atraumatic.   Mouth/Throat: No oropharyngeal exudate.  Eyes: Pupils are equal, round, and reactive to light.  Neck: Normal range of motion. Neck supple.  Cardiovascular: Normal rate, regular rhythm and normal heart sounds.  Exam reveals no gallop and no friction rub.   No murmur heard.  Pulmonary/Chest: Effort normal and breath sounds normal. No respiratory distress. She has no wheezes. She has no rales.  Abdominal: Soft. Bowel sounds are normal. She exhibits no distension and no mass. There is tenderness in the right upper quadrant. There is no rebound and no guarding.  Mild RUQ tenderness on exam, but says it is much improved from prior hospitalization.   Musculoskeletal: Normal range of motion. She exhibits no edema and no tenderness.  Neurological: She is alert and oriented to person, place, and time.  Skin: Skin is warm and dry.  Psychiatric: She has a normal mood and affect.    ED Course  Procedures (including critical care time) Labs Review Labs Reviewed  CBC WITH DIFFERENTIAL - Abnormal; Notable for the following:    Hemoglobin 10.3 (*)    HCT 31.3 (*)    MCV 74.0 (*)    MCH 24.3 (*)    RDW 25.9 (*)    All other components within normal limits  COMPREHENSIVE METABOLIC PANEL - Abnormal; Notable for the following:    Sodium 131 (*)    Glucose, Bld 100 (*)    Albumin 3.4 (*)    AST 38 (*)    All other components within normal limits  URINALYSIS, ROUTINE W REFLEX MICROSCOPIC - Abnormal; Notable for the following:    APPearance CLOUDY (*)    Hgb urine dipstick SMALL (*)    Leukocytes, UA SMALL (*)    All other components within normal limits  URINE MICROSCOPIC-ADD ON - Abnormal; Notable for the following:    Bacteria, UA FEW (*)    All other components within normal limits  CLOSTRIDIUM DIFFICILE BY PCR  CULTURE, BLOOD (ROUTINE X 2)  CULTURE, BLOOD (ROUTINE X 2)  STOOL CULTURE  AFB CULTURE, BLOOD  LIPASE, BLOOD  POCT PREGNANCY, URINE   Imaging Review US Abdomen  Complete  08/11/2013   *RADIOLOGY REPORT*  Clinical Data:  Right upper quadrant pain.  COMPLETE ABDOMINAL ULTRASOUND  Comparison:  07/09/2013.  Findings:  Gallbladder:  No gallstones, gallbladder wall thickening, or pericholecystic fluid.  Common bile duct:  Measures 1 mm, within normal limits.  Liver:  No focal lesion identified.  Within normal limits in parenchymal echogenicity.  IVC:  Appears normal.  Pancreas:  No focal abnormality seen.  Spleen:  Measures 9.8 cm, negative.  Right Kidney:  Measures 12.8 cm.  Parenchymal echogenicity is normal.  No hydronephrosis.  No focal lesion.  Left Kidney:  Measures 12.8 cm.  Parenchymal echogenicity is normal.  No hydronephrosis.  No focal lesion.  Abdominal aorta:  No aneurysm identified.  IMPRESSION: Negative abdominal ultrasound.   Original Report Authenticated By: Leanna Battles, M.D.    10:55 AM Have spoken to Dr. Luciana Axe with ID.  He recommends adding a AFB blood culture.  If pt HDS, he does not recomned starting ABX as fever could be to restarting treatment.  AFB bottle must be sent by courier from [facility].  Pt tolerating PO.   MDM   1. Fever   2. Nausea vomiting and diarrhea    Pt is a 37 y.o. female with Pmhx as above including HIV with CD4 count <10, who presents with about 24 hrs of fever, n/v/d, but denies ab pain, urinary symptoms. Pt had recent visit and was diagnosed with reflux esophagitis, found to also stable have GB sludge on [place].  She had been noncompliant w/ meds, but just restarted her antiretrovirals.  Pt saw ID yesterday, but was afebrile at the time.  On  PE, febrile, but well appearing, in NAD.  Mild RUQ ttp w/o rebound or guarding.  Labs show mild AST elevation, prompting ab [place] which was nml.  Urine does not appear infected & denies urinary symptoms.  Pt unable to given stool sample here. Pt felt somewhat improved after IVF, zofran, and was able to tolerable PO.  I have spoken to ID. Dr. Luciana Axe, who does not feel I need to start abx at this  point as pt HDS.  She will f/u in clinic tomorrow where she can give stool sample.  AFB blood culture also added and she can take stool culture to clinic per Dr. Luciana Axe.  1. Fever   2. Nausea vomiting and diarrhea         Shanna Cisco, MD 08/11/13 1558

## 2013-08-11 NOTE — Progress Notes (Signed)
Pt confirms pcp is [facility] updated  

## 2013-08-11 NOTE — Progress Notes (Signed)
[facility] provided pt with a list of primary care resources in [facility]. Patient stated that she was pending Medicaid.

## 2013-08-12 LAB — HIV-1 RNA QUANT-NO REFLEX-BLD: HIV 1 RNA Quant: 285975 copies/mL — ABNORMAL HIGH (ref <20)

## 2013-08-17 LAB — CULTURE, BLOOD (ROUTINE X 2): Culture: NO GROWTH

## 2013-08-30 ENCOUNTER — Encounter: Payer: Self-pay | Admitting: Internal Medicine

## 2013-08-30 ENCOUNTER — Ambulatory Visit (INDEPENDENT_AMBULATORY_CARE_PROVIDER_SITE_OTHER): Payer: MEDICAID | Admitting: Internal Medicine

## 2013-08-30 VITALS — BP 127/75 | HR 81 | Temp 98.1°F | Ht 72.0 in | Wt 149.0 lb

## 2013-08-30 DIAGNOSIS — B20 Human immunodeficiency virus [HIV] disease: Secondary | ICD-10-CM

## 2013-08-30 DIAGNOSIS — R1310 Dysphagia, unspecified: Secondary | ICD-10-CM | POA: Insufficient documentation

## 2013-08-30 NOTE — Progress Notes (Signed)
Patient ID: [name], female   DOB: 07-28-1976, 37 y.o.   MRN: [id number]          [facility] for Infectious Disease  Patient Active Problem List   Diagnosis Date Noted  . Unintentional weight loss 07/10/2013    Priority: High  . Esophageal ulcer 07/10/2013    Priority: High  . Bipolar disorder 04/01/2012    Priority: High  . HIV INFECTION 02/28/2009    Priority: High  . 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 Hepatitis B 07/10/2013  . Elevated lipase 07/10/2013  . Elevated alkaline phosphatase level 07/10/2013  . GENITAL HERPES 02/28/2009  . Microcytic anemia 02/27/2009    Patient's Medications  New Prescriptions   No medications on file  Previous Medications   ACETAMINOPHEN (TYLENOL) 500 MG TABLET    Take 1,000 mg by mouth every 6 (six) hours as needed for pain.   ALBUTEROL (PROVENTIL HFA;VENTOLIN HFA) 108 (90 BASE) MCG/ACT INHALER    Inhale 2 puffs into the lungs every 6 (six) hours as needed for wheezing.   BENZTROPINE (COGENTIN) 2 MG TABLET    Take 2 mg by mouth daily.   DARUNAVIR ETHANOLATE (PREZISTA) 800 MG TABLET    Take 800 mg by mouth daily with breakfast.   DEXLANSOPRAZOLE (DEXILANT) 60 MG CAPSULE    Take 60 mg by mouth daily.   EMTRICITABINE-TENOFOVIR (TRUVADA) 200-300 MG PER TABLET    Take 1 tablet by mouth daily.   ENSURE PLUS (ENSURE PLUS) LIQD    Take 237 mLs by mouth 2 (two) times daily between meals.   FLUCONAZOLE (DIFLUCAN) 100 MG TABLET    Take 100 mg by mouth once a week.   IRON POLYSACCHARIDES (NIFEREX) 150 MG CAPSULE    Take 150 mg by mouth 2 (two) times daily.   MULTIPLE VITAMINS-MINERALS (WOMENS MULTI VITAMIN & MINERAL PO)    Take 2 each by mouth daily. Gummy vitamins   OLANZAPINE ZYDIS (ZYPREXA) 20 MG DISINTEGRATING TABLET    Take 20 mg by mouth at bedtime.   ONDANSETRON (ZOFRAN) 4 MG TABLET    Take 4 mg by mouth every 8 (eight) hours as needed for nausea.   PROMETHAZINE (PHENERGAN) 12.5 MG TABLET    Take 12.5 mg by mouth every  6 (six) hours as needed for nausea.   RITONAVIR (NORVIR) 100 MG TABS TABLET    Take 100 mg by mouth daily with breakfast.  Modified Medications   No medications on file  Discontinued Medications   ONDANSETRON (ZOFRAN) 4 MG TABLET    Take 1 tablet (4 mg total) by mouth every 6 (six) hours.    Subjective: [name] is in for her routine visit. She is feeling much better. Her appetite has improved and she is gaining weight. She denies missing any doses of her Truvada, Prezista or Norvir. However, she has not heard anything about her ADAP approval and will run out of her medications tomorrow. She's not having any further pain upon swallowing but does note that she has some difficulty swallowing bread or apples. Occasionally food will get hung up.  Review of Systems: Pertinent items are noted in HPI.  Past Medical History  Diagnosis Date  . HIV positive   . Hepatitis B     /E-chart  . Microcytic anemia     h/o per E-chart  . 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 Noncompliance with medication regimen     /e-chart  . Acute  psychosis 03/10/12    2nd admission in last wk for this  . Pyelonephritis     h/o per E-chart  . Thyroid disease     History  Substance Use Topics  . Smoking status: Never Smoker   . Smokeless tobacco: Never Used  . Alcohol Use: No    No family history on file.  Allergies  Allergen Reactions  . Bactrim Itching  . Dapsone Itching  . Orange Fruit Itching  . Peanut-Containing Drug Products Hives  . Shellfish Allergy Hives    Objective: Temp: 98.1 F (36.7 C) (09/15 0922) Temp src: Oral (09/15 0922) BP: 127/75 mmHg (09/15 0922) Pulse Rate: 81 (09/15 0922)  General: She is in good spirits her weight is up Oral: No oropharyngeal lesions Skin: No rash Lungs: Clear Cor: Regular S1 and S2 no murmurs Abdomen: Soft and nontender Mood and affect: Normal  Lab Results HIV 1 RNA Quant (copies/mL)  Date  Value  08/10/2013 [id number]*  07/10/2013 [id number]*  07/28/2012 [id number]*     CD4 T Cell Abs (/uL)  Date Value  08/10/2013 <10*  07/10/2013 <10*  07/28/2012 <10*     Assessment: She is feeling much better since restarting antiretroviral therapy. I will give her another temporary supply of Truvada and Prezista (we do not have a supply of Norvir) while awaiting ADAP approval.  She may be developing a stricture at the site of her circumferential esophageal ulcer.  Her depression has improved. She is scheduled to see her mental health counselor.  Plan: 1. Continue Truvada and Prezista pending ADAP approval 2. Repeat lab work in 3 weeks 3. Monitor dysphagia 4. Mental health counseling 5. Followup in 5 weeks   Cliffton Asters, MD [facility] for Infectious Disease [facility] [phone] pager   [phone] cell 08/30/2013, 9:40 AM

## 2013-08-31 ENCOUNTER — Other Ambulatory Visit: Payer: Self-pay | Admitting: *Deleted

## 2013-08-31 DIAGNOSIS — B20 Human immunodeficiency virus [HIV] disease: Secondary | ICD-10-CM

## 2013-08-31 MED ORDER — RITONAVIR 100 MG PO TABS: 100.0000 mg | ORAL_TABLET | Freq: Every day | ORAL | Status: DC

## 2013-08-31 MED ORDER — EMTRICITABINE-TENOFOVIR DF 200-300 MG PO TABS: 1.0000 | ORAL_TABLET | Freq: Every day | ORAL | Status: DC

## 2013-08-31 MED ORDER — DARUNAVIR ETHANOLATE 800 MG PO TABS: 800.0000 mg | ORAL_TABLET | Freq: Every day | ORAL | Status: DC

## 2013-09-06 ENCOUNTER — Ambulatory Visit: Payer: Self-pay

## 2013-09-12 ENCOUNTER — Encounter (HOSPITAL_COMMUNITY): Payer: Self-pay

## 2013-09-12 ENCOUNTER — Emergency Department (HOSPITAL_COMMUNITY)
Admission: EM | Admit: 2013-09-12 | Discharge: 2013-09-12 | Disposition: A | Discharge status: Home / Self Care | Payer: Medicaid Other | Attending: Emergency Medicine | Admitting: Emergency Medicine

## 2013-09-12 DIAGNOSIS — Z21 Asymptomatic human immunodeficiency virus [HIV] infection status: Secondary | ICD-10-CM | POA: Insufficient documentation

## 2013-09-12 DIAGNOSIS — D509 Iron deficiency anemia, unspecified: Secondary | ICD-10-CM | POA: Insufficient documentation

## 2013-09-12 DIAGNOSIS — B1910 Unspecified viral hepatitis B without hepatic coma: Secondary | ICD-10-CM | POA: Insufficient documentation

## 2013-09-12 DIAGNOSIS — Z79899 Other long term (current) drug therapy: Secondary | ICD-10-CM | POA: Insufficient documentation

## 2013-09-12 DIAGNOSIS — E079 Disorder of thyroid, unspecified: Secondary | ICD-10-CM | POA: Insufficient documentation

## 2013-09-12 DIAGNOSIS — K2210 Ulcer of esophagus without bleeding: Secondary | ICD-10-CM | POA: Insufficient documentation

## 2013-09-12 DIAGNOSIS — R1312 Dysphagia, oropharyngeal phase: Secondary | ICD-10-CM | POA: Insufficient documentation

## 2013-09-12 MED ORDER — LIDOCAINE VISCOUS 2 % MT SOLN: 15.0000 mL | OROMUCOSAL | Status: DC | PRN

## 2013-09-12 MED ORDER — PREDNISONE 20 MG PO TABS: ORAL_TABLET | ORAL | Status: DC

## 2013-09-12 NOTE — ED Provider Notes (Signed)
CSN: [id number]     Arrival date & time 09/12/13  0848 History   First MD Initiated Contact with Patient 09/12/13 1004     Chief Complaint  Patient presents with  . Dysphagia   (Consider location/radiation/quality/duration/timing/severity/associated sxs/prior Treatment) HPI Comments: 64 six-year[age]old female with a past medical history of HIV, hepatitis B, thyroid disease, and microcytic anemia presents to the emergency department complaining of a sensation of "something stuck" on the right side of her throat beginning yesterday afternoon. Patient states anytime she swallows she has an uncomfortable sensation on the right side, is able to swallow, however then "everything comes back up". Denies nausea. Patient states one month ago she had an endoscopy and was diagnosed with an esophageal ulcer, did not followup with GI. Takes Dexilant daily for the ulcer. Denies recent illness, fever or chills. Last CD4 count was less than 10 back in July.  The history is provided by the patient.    Past Medical History  Diagnosis Date  . HIV positive   . Hepatitis B     /E-chart  . Microcytic anemia     h/o per E-chart  . 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 Noncompliance with medication regimen     /e-chart  . 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 Pyelonephritis     h/o per E-chart  . Thyroid disease    Past Surgical History  Procedure Laterality Date  . Thyroid surgery    . Thyroid lobectomy  08/2002    left & isthmectomy; for benign thyroid adenoma/E-chart  . Tubal ligation  07/2002    /E-chart  . Esophagogastroduodenoscopy N/A 07/13/2013    Procedure: ESOPHAGOGASTRODUODENOSCOPY (EGD);  Surgeon: Theda Belfast, MD;  Location: [facility];  Service: Endoscopy;  Laterality: N/A;   History reviewed. No pertinent family history. History  Substance Use Topics  . Smoking status: Never Smoker   . Smokeless tobacco:  Never Used  . Alcohol Use: No   OB History   Grav Para Term Preterm Abortions TAB SAB Ect Mult Living                 Review of Systems  Constitutional: Negative for fever and chills.  HENT: Positive for trouble swallowing. Negative for sore throat and voice change.   Respiratory: Negative for shortness of breath.   Cardiovascular: Negative for chest pain.  Gastrointestinal: Negative for nausea and vomiting.  All other systems reviewed and are negative.    Allergies  Bactrim; Dapsone; Orange fruit; Peanut-containing drug products; and Shellfish allergy  Home Medications   Current Outpatient Rx  Name  Route  Sig  Dispense  Refill  . acetaminophen (TYLENOL) 500 MG tablet   Oral   Take 1,000 mg by mouth every 6 (six) hours as needed for pain.         Marland Kitchen albuterol (PROVENTIL HFA;VENTOLIN HFA) 108 (90 BASE) MCG/ACT inhaler   Inhalation   Inhale 2 puffs into the lungs every 6 (six) hours as needed for wheezing.         . benztropine (COGENTIN) 2 MG tablet   Oral   Take 2 mg by mouth daily.         . Darunavir Ethanolate (PREZISTA) 800 MG tablet   Oral   Take 1 tablet (800 mg total) by mouth daily with breakfast.   30 tablet   6   . dexlansoprazole (DEXILANT) 60 MG capsule  Oral   Take 60 mg by mouth daily.         Marland Kitchen emtricitabine-tenofovir (TRUVADA) 200-300 MG per tablet   Oral   Take 1 tablet by mouth daily.   30 tablet   6   . ENSURE PLUS (ENSURE PLUS) LIQD   Oral   Take 237 mLs by mouth 2 (two) times daily between meals.         . fluconazole (DIFLUCAN) 100 MG tablet   Oral   Take 100 mg by mouth once a week.         . iron polysaccharides (NIFEREX) 150 MG capsule   Oral   Take 150 mg by mouth 2 (two) times daily.         . Multiple Vitamins-Minerals (WOMENS MULTI VITAMIN & MINERAL PO)   Oral   Take 2 each by mouth daily. Gummy vitamins         . OLANZapine zydis (ZYPREXA) 20 MG disintegrating tablet   Oral   Take 20 mg by mouth at  bedtime.         . ondansetron (ZOFRAN) 4 MG tablet   Oral   Take 4 mg by mouth every 8 (eight) hours as needed for nausea.         . promethazine (PHENERGAN) 12.5 MG tablet   Oral   Take 12.5 mg by mouth every 6 (six) hours as needed for nausea.         . ritonavir (NORVIR) 100 MG TABS tablet   Oral   Take 1 tablet (100 mg total) by mouth daily with breakfast.   30 tablet   6    BP 115/82  Pulse 96  Temp(Src) 99.8 F (37.7 C) (Oral)  Resp 18  SpO2 100%  LMP 08/22/2013 Physical Exam  Nursing note and vitals reviewed. Constitutional: She is oriented to person, place, and time. She appears well-developed and well-nourished. No distress.  HENT:  Head: Normocephalic and atraumatic.  Mouth/Throat: Uvula is midline, oropharynx is clear and moist and mucous membranes are normal. No oral lesions. No oropharyngeal exudate, posterior oropharyngeal edema, posterior oropharyngeal erythema or tonsillar abscesses.  Eyes: Conjunctivae are normal.  Neck: Trachea normal and normal range of motion. Neck supple. No edema and normal range of motion present. No mass and no thyromegaly present.  Cardiovascular: Normal rate, regular rhythm and normal heart sounds.   Pulmonary/Chest: Effort normal and breath sounds normal.  Musculoskeletal: Normal range of motion. She exhibits no edema.  Lymphadenopathy:    She has no cervical adenopathy.  Neurological: She is alert and oriented to person, place, and time.  Skin: Skin is warm and dry. She is not diaphoretic.  Psychiatric: She has a normal mood and affect. Her behavior is normal.    ED Course  Procedures (including critical care time) Labs Review Labs Reviewed - No data to display Imaging Review No results found.  MDM   1. Dysphagia, oropharyngeal    Patient with dysphagia, probable esophageal ulcer. She was unable to swallow water without it coming directly back up. I spoke with Dr. Loreta Ave, gastroenterologist who states this is most  likely from her esophageal ulcer, common in HIV patients with poor CD4 counts, suggested two-week taper of prednisone along with viscous lidocaine, followup with Dr. Orvan Falconer. Patient stable for discharge, she appears in no apparent distress, not complaining of any other symptoms. Case discussed with my attending Dr. Effie Shy who agrees with plan of care.     [name],  PA-C 09/12/13 1107

## 2013-09-12 NOTE — ED Notes (Signed)
She feels as if there is "something stuck" at right post. Oropharynx since yesterday.  She further tells me she was seen here about a month ago, at which time she underwent endoscopy, and was dx with "esophageal ulcer".  She is in no distress.  She denies fever/n/v/d.

## 2013-09-12 NOTE — ED Provider Notes (Signed)
Medical screening examination/treatment/procedure(s) were performed by non-physician practitioner and as supervising physician I was immediately available for consultation/collaboration.  Katti Pelle L Ahaan Zobrist, MD 09/12/13 1543 

## 2013-09-14 ENCOUNTER — Encounter: Payer: Self-pay | Admitting: Internal Medicine

## 2013-09-14 ENCOUNTER — Telehealth: Payer: Self-pay | Admitting: *Deleted

## 2013-09-14 ENCOUNTER — Ambulatory Visit (INDEPENDENT_AMBULATORY_CARE_PROVIDER_SITE_OTHER): Payer: Medicaid Other | Admitting: Internal Medicine

## 2013-09-14 VITALS — BP 137/84 | HR 67 | Temp 98.2°F | Wt 159.0 lb

## 2013-09-14 DIAGNOSIS — B20 Human immunodeficiency virus [HIV] disease: Secondary | ICD-10-CM

## 2013-09-14 MED ORDER — AZITHROMYCIN 200 MG/5ML PO SUSR: 1200.0000 mg | ORAL | Status: DC

## 2013-09-14 MED ORDER — ATOVAQUONE 750 MG/5ML PO SUSP: 750.0000 mg | Freq: Every day | ORAL | Status: DC

## 2013-09-14 NOTE — Progress Notes (Signed)
[facility] NOTE  RFV:  follow up from recent ED visit Subjective:    Patient ID: [name], female    DOB: 12/20/1975, 37 y.o.   MRN: [id number]  HPI 37yo F , HIV, CD 4 count, <10/VL 285,975.  On truvada/DRVr, taking daily, did notice pill discomfort with truvada. occ when big meal, has solid food dysphagia. Occasionally causes herself to vomit if she feels anxious with the solid food sticking. This only occurred once this past Saturday, hwere she went to the ED for evaluation on Sunday. Since then, she is feeling better. No recurrence. Otherwise is doing fine   Current Outpatient Prescriptions on File Prior to Visit  Medication Sig Dispense Refill  . albuterol (PROVENTIL HFA;VENTOLIN HFA) 108 (90 BASE) MCG/ACT inhaler Inhale 2 puffs into the lungs every 6 (six) hours as needed for wheezing.      . benztropine (COGENTIN) 2 MG tablet Take 2 mg by mouth daily.      . Darunavir Ethanolate (PREZISTA) 800 MG tablet Take 1 tablet (800 mg total) by mouth daily with breakfast.  30 tablet  6  . dexlansoprazole (DEXILANT) 60 MG capsule Take 60 mg by mouth daily.      Marland Kitchen emtricitabine-tenofovir (TRUVADA) 200-300 MG per tablet Take 1 tablet by mouth daily.  30 tablet  6  . ENSURE PLUS (ENSURE PLUS) LIQD Take 237 mLs by mouth 2 (two) times daily between meals.      . fluconazole (DIFLUCAN) 100 MG tablet Take 100 mg by mouth once a week.      . iron polysaccharides (NIFEREX) 150 MG capsule Take 150 mg by mouth 2 (two) times daily.      Marland Kitchen lidocaine (XYLOCAINE) 2 % solution Take 15 mLs by mouth as needed for pain.  100 mL  0  . Multiple Vitamins-Minerals (WOMENS MULTI VITAMIN & MINERAL PO) Take 2 each by mouth daily. Gummy vitamins      . OLANZapine zydis (ZYPREXA) 20 MG disintegrating tablet Take 20 mg by mouth at bedtime.      . predniSONE (DELTASONE) 20 MG tablet Take 3 tabs PO x 7 days, 2 tabs PO x 2 days, 1 tab PO x 1 day  26 tablet  0  . ritonavir (NORVIR) 100 MG TABS tablet Take 1 tablet (100  mg total) by mouth daily with breakfast.  30 tablet  6   No current facility-administered medications on file prior to visit.  .    Review of Systems     Objective:   Physical Exam BP 137/84  Pulse 67  Temp(Src) 98.2 F (36.8 C) (Oral)  Wt 159 lb (72.122 kg)  BMI 21.56 kg/m2  LMP 08/22/2013 Physical Exam  Constitutional:  oriented to person, place, and time. appears well-developed and well-nourished. No distress.  HENT:  Mouth/Throat: Oropharynx is clear and moist. No oropharyngeal exudate.  Cardiovascular: Normal rate, regular rhythm and normal heart sounds. Exam reveals no gallop and no friction rub.  No murmur heard.  Pulmonary/Chest: Effort normal and breath sounds normal. No respiratory distress. no wheezes.  Lymphadenopathy:  no cervical adenopathy.  Skin: Skin is warm and dry. No rash noted. No erythema.  Psychiatric:  a normal mood and affect.  behavior is normal.       Assessment & Plan:  HIV = continue iwht current regimen.  Will re-test for labs in 2 months. She can split her truvada in half.  OI proph = continue with oi proph that she is on currently,  fluconazole. She needs refills for azithromycin, and atovaquone.  Dysphagia =appears to be c/w stricture. eso bx stains are negative for cmv. We will not continue valcyte at this time. We will ask her to eat smaller bites, ?soft foods to minimize symptoms from recurrening. Defer referring to GI at this time  rtc in 6-8 wks with Dr. Orvan Falconer

## 2013-09-14 NOTE — Telephone Encounter (Signed)
Pt never had Valcyte rx filled after discharge from [facility] 07/16/13.  Quantity not correct and pharmacy questioning filling the rx.  Pt seen in [facility] ED for similar problem 09/12/13.  RN asked Dr Orvan Falconer for advice.  Dr. Orvan Falconer recommended talking with the pt and offering a "work-in" visit for evaluation.   Spoke with the pt.  Agreed to come in for "work-in" appointment today. [facility] to "hold" Valcyte prescription.  Quantity of liquid in question.  Will wait to notification from [facility] MD.

## 2013-09-14 NOTE — Progress Notes (Signed)
  [facility] - Pharmacist    HPI: [name] is a 37 y.o. female here for follow-up of difficulty swallowing.  Allergies: Allergies  Allergen Reactions  . Bactrim Itching  . Dapsone Itching  . Orange Fruit Itching  . Peanut-Containing Drug Products Hives  . Shellfish Allergy Hives    Vitals: Temp: 98.2 F (36.8 C) (09/30 1354) Temp src: Oral (09/30 1354) BP: 137/84 mmHg (09/30 1354) Pulse Rate: 67 (09/30 1354)  Past Medical History: Past Medical History  Diagnosis Date  . HIV positive   . Hepatitis B     /E-chart  . Microcytic anemia     h/o per E-chart  . 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 Noncompliance with medication regimen     /e-chart  . 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 Pyelonephritis     h/o per E-chart  . Thyroid disease     Social History: History   Social History  . Marital Status: Single    Spouse Name: N/A    Number of Children: N/A  . Years of Education: N/A   Social History Main Topics  . Smoking status: Never Smoker   . Smokeless tobacco: Never Used  . Alcohol Use: No  . Drug Use: No  . Sexual Activity: No     Comment: declined condoms   Other Topics Concern  . None   Social History Narrative  . None    Current Regimen: Darunavir/ritonavir, Truvada  Labs: HIV 1 RNA Quant (copies/mL)  Date Value  08/10/2013 161096*  07/10/2013 [id number]*  07/28/2012 811914*     CD4 T Cell Abs (/uL)  Date Value  08/10/2013 <10*  07/10/2013 <10*  07/28/2012 <10*     Hep B S Ab (no units)  Date Value  02/09/2007 No      Hepatitis B Surface Ag (no units)  Date Value  02/09/2007 No      HCV Ab (no units)  Date Value  02/09/2007 No     CrCl: The CrCl is unknown because both a height and weight (above a minimum accepted value) are required for this calculation.  Lipids:    Component Value Date/Time   CHOL 164 03/23/2012  1019   TRIG 114 03/23/2012 1019   HDL 49 03/23/2012 1019   CHOLHDL 3.3 03/23/2012 1019   VLDL 23 03/23/2012 1019   LDLCALC 92 03/23/2012 1019    Assessment: Ms. Lober reports adherence with her HIV medications.  She is using her pill box, and it is filled correctly.  She had some difficulty swallowing her Truvada once in the past week, but otherwise has not missed any of her HIV medications.  Recommendations: Adherence was reinforced. I asked that she call if she has any difficulties taking her medications.  Sallee Provencal, Pharm.D., BCPS, AAHIVP Clinical Infectious Disease Pharmacist [facility] 09/14/2013, 3:00 PM

## 2013-09-16 NOTE — Telephone Encounter (Signed)
We discontinued the valcyte

## 2013-09-20 ENCOUNTER — Ambulatory Visit: Payer: Self-pay

## 2013-09-23 ENCOUNTER — Telehealth: Payer: Self-pay | Admitting: *Deleted

## 2013-09-23 LAB — AFB CULTURE, BLOOD

## 2013-09-23 NOTE — Telephone Encounter (Signed)
Requested make appointment when she comes for Lab Work on 09/29/13.

## 2013-09-29 ENCOUNTER — Other Ambulatory Visit: Payer: Self-pay

## 2013-09-30 ENCOUNTER — Other Ambulatory Visit: Payer: Self-pay

## 2013-10-01 ENCOUNTER — Other Ambulatory Visit: Payer: Self-pay

## 2013-10-04 ENCOUNTER — Ambulatory Visit: Payer: Self-pay | Admitting: Internal Medicine

## 2013-10-11 ENCOUNTER — Ambulatory Visit: Payer: Self-pay | Admitting: Internal Medicine

## 2013-10-18 ENCOUNTER — Other Ambulatory Visit (INDEPENDENT_AMBULATORY_CARE_PROVIDER_SITE_OTHER): Payer: Medicaid Other

## 2013-10-18 DIAGNOSIS — B20 Human immunodeficiency virus [HIV] disease: Secondary | ICD-10-CM

## 2013-10-18 DIAGNOSIS — Z113 Encounter for screening for infections with a predominantly sexual mode of transmission: Secondary | ICD-10-CM

## 2013-10-18 DIAGNOSIS — Z79899 Other long term (current) drug therapy: Secondary | ICD-10-CM

## 2013-10-18 LAB — COMPREHENSIVE METABOLIC PANEL
ALT: 9 U/L (ref 0–35)
Alkaline Phosphatase: 75 U/L (ref 39–117)
CO2: 26 mEq/L (ref 19–32)
Creat: 0.57 mg/dL (ref 0.50–1.10)
Sodium: 138 mEq/L (ref 135–145)
Total Bilirubin: 0.4 mg/dL (ref 0.3–1.2)

## 2013-10-18 LAB — LIPID PANEL
Cholesterol: 162 mg/dL (ref 0–200)
LDL Cholesterol: 88 mg/dL (ref 0–99)
Total CHOL/HDL Ratio: 4.1 Ratio
VLDL: 34 mg/dL (ref 0–40)

## 2013-10-18 LAB — CBC
MCH: 24.1 pg — ABNORMAL LOW (ref 26.0–34.0)
MCHC: 33.6 g/dL (ref 30.0–36.0)
MCV: 71.7 fL — ABNORMAL LOW (ref 78.0–100.0)
Platelets: 288 10*3/uL (ref 150–400)
RDW: 17.9 % — ABNORMAL HIGH (ref 11.5–15.5)

## 2013-10-19 ENCOUNTER — Telehealth: Payer: Self-pay | Admitting: *Deleted

## 2013-10-19 ENCOUNTER — Encounter: Payer: Self-pay | Admitting: *Deleted

## 2013-10-19 LAB — HIV-1 RNA QUANT-NO REFLEX-BLD: HIV 1 RNA Quant: 139989 copies/mL — ABNORMAL HIGH (ref <20)

## 2013-10-19 LAB — T-HELPER CELL (CD4) - (RCID CLINIC ONLY): CD4 T Cell Abs: <10 /uL — ABNORMAL LOW (ref 400–2700)

## 2013-10-19 NOTE — Telephone Encounter (Signed)
Pt needs close f/u per Dr. Orvan Falconer.  Last VL 140,000.  Needs to make and keep MD appt.

## 2013-10-27 ENCOUNTER — Telehealth: Payer: Self-pay | Admitting: *Deleted

## 2013-10-27 NOTE — Telephone Encounter (Signed)
Made PAP smear appt for 0930 and MD appt for 1015 on 11/01/13.   Asked pt to call back if she will be unable to keep these appts.

## 2013-11-01 ENCOUNTER — Ambulatory Visit: Payer: Medicaid Other

## 2013-11-01 ENCOUNTER — Encounter: Payer: Self-pay | Admitting: *Deleted

## 2013-11-01 ENCOUNTER — Telehealth: Payer: Self-pay | Admitting: *Deleted

## 2013-11-01 ENCOUNTER — Ambulatory Visit: Payer: Medicaid Other | Admitting: Internal Medicine

## 2013-11-01 NOTE — Telephone Encounter (Signed)
Left patient a message.  Rescheduled her PAP smear and MD appts until 11/08/13.  Will send her a letter with this information as well. Pt has a detectable viral load and needs follow-up.  Next step will be [facility] [employer].  Discussed with Dr. Orvan Falconer.

## 2013-11-08 ENCOUNTER — Telehealth: Payer: Self-pay | Admitting: *Deleted

## 2013-11-08 ENCOUNTER — Ambulatory Visit: Payer: Medicaid Other

## 2013-11-08 ENCOUNTER — Ambulatory Visit: Payer: Medicaid Other | Admitting: Internal Medicine

## 2013-11-08 NOTE — Telephone Encounter (Signed)
Referral to [facility] today.

## 2013-11-29 ENCOUNTER — Ambulatory Visit: Payer: Medicaid Other

## 2013-12-15 ENCOUNTER — Telehealth: Payer: Self-pay | Admitting: *Deleted

## 2013-12-15 NOTE — Telephone Encounter (Signed)
Requested pt "walk-in" to see Dr. Orvan Falconer.  Left days and times information.

## 2013-12-27 ENCOUNTER — Emergency Department (HOSPITAL_COMMUNITY): Payer: Medicaid Other

## 2013-12-27 ENCOUNTER — Inpatient Hospital Stay (HOSPITAL_COMMUNITY)
Admission: EM | Admit: 2013-12-27 | Discharge: 2013-12-29 | DRG: 152 | Disposition: A | Discharge status: Home / Self Care | Payer: Medicaid Other | Attending: Internal Medicine | Admitting: Internal Medicine

## 2013-12-27 ENCOUNTER — Encounter (HOSPITAL_COMMUNITY): Payer: Self-pay | Admitting: Emergency Medicine

## 2013-12-27 DIAGNOSIS — R509 Fever, unspecified: Secondary | ICD-10-CM

## 2013-12-27 DIAGNOSIS — Z91010 Allergy to peanuts: Secondary | ICD-10-CM

## 2013-12-27 DIAGNOSIS — F319 Bipolar disorder, unspecified: Secondary | ICD-10-CM

## 2013-12-27 DIAGNOSIS — J111 Influenza due to unidentified influenza virus with other respiratory manifestations: Principal | ICD-10-CM

## 2013-12-27 DIAGNOSIS — D509 Iron deficiency anemia, unspecified: Secondary | ICD-10-CM

## 2013-12-27 DIAGNOSIS — B1910 Unspecified viral hepatitis B without hepatic coma: Secondary | ICD-10-CM

## 2013-12-27 DIAGNOSIS — E43 Unspecified severe protein-calorie malnutrition: Secondary | ICD-10-CM | POA: Diagnosis present

## 2013-12-27 DIAGNOSIS — Z91199 Patient's noncompliance with other medical treatment and regimen due to unspecified reason: Secondary | ICD-10-CM

## 2013-12-27 DIAGNOSIS — Z21 Asymptomatic human immunodeficiency virus [HIV] infection status: Secondary | ICD-10-CM

## 2013-12-27 DIAGNOSIS — Z79899 Other long term (current) drug therapy: Secondary | ICD-10-CM

## 2013-12-27 DIAGNOSIS — Z9119 Patient's noncompliance with other medical treatment and regimen: Secondary | ICD-10-CM

## 2013-12-27 DIAGNOSIS — N390 Urinary tract infection, site not specified: Secondary | ICD-10-CM

## 2013-12-27 DIAGNOSIS — Z8619 Personal history of other infectious and parasitic diseases: Secondary | ICD-10-CM

## 2013-12-27 DIAGNOSIS — B349 Viral infection, unspecified: Secondary | ICD-10-CM

## 2013-12-27 DIAGNOSIS — E079 Disorder of thyroid, unspecified: Secondary | ICD-10-CM | POA: Diagnosis present

## 2013-12-27 DIAGNOSIS — B20 Human immunodeficiency virus [HIV] disease: Secondary | ICD-10-CM

## 2013-12-27 LAB — COMPREHENSIVE METABOLIC PANEL
ALK PHOS: 109 U/L (ref 39–117)
ALT: 11 U/L (ref 0–35)
AST: 19 U/L (ref 0–37)
Albumin: 3.5 g/dL (ref 3.5–5.2)
BILIRUBIN TOTAL: 0.8 mg/dL (ref 0.3–1.2)
BUN: 6 mg/dL (ref 6–23)
CALCIUM: 8.5 mg/dL (ref 8.4–10.5)
CHLORIDE: 99 meq/L (ref 96–112)
CO2: 23 meq/L (ref 19–32)
Creatinine, Ser: 0.92 mg/dL (ref 0.50–1.10)
GFR, EST NON AFRICAN AMERICAN: 79 mL/min — AB (ref >90)
GLUCOSE: 111 mg/dL — AB (ref 70–99)
POTASSIUM: 3.7 meq/L (ref 3.7–5.3)
SODIUM: 137 meq/L (ref 137–147)
Total Protein: 8.1 g/dL (ref 6.0–8.3)

## 2013-12-27 LAB — CBC WITH DIFFERENTIAL/PLATELET
BASOS ABS: 0 10*3/uL (ref 0.0–0.1)
Basophils Relative: 0 % (ref 0–1)
EOS PCT: 0 % (ref 0–5)
Eosinophils Absolute: 0 10*3/uL (ref 0.0–0.7)
HCT: 27.9 % — ABNORMAL LOW (ref 36.0–46.0)
Hemoglobin: 9 g/dL — ABNORMAL LOW (ref 12.0–15.0)
LYMPHS ABS: 0.5 10*3/uL — AB (ref 0.7–4.0)
LYMPHS PCT: 8 % — AB (ref 12–46)
MCH: 22.8 pg — ABNORMAL LOW (ref 26.0–34.0)
MCHC: 32.3 g/dL (ref 30.0–36.0)
MCV: 70.6 fL — AB (ref 78.0–100.0)
Monocytes Absolute: 0.6 10*3/uL (ref 0.1–1.0)
Monocytes Relative: 8 % (ref 3–12)
NEUTROS ABS: 5.8 10*3/uL (ref 1.7–7.7)
Neutrophils Relative %: 84 % — ABNORMAL HIGH (ref 43–77)
PLATELETS: 296 10*3/uL (ref 150–400)
RBC: 3.95 MIL/uL (ref 3.87–5.11)
RDW: 15.6 % — ABNORMAL HIGH (ref 11.5–15.5)
WBC: 6.9 10*3/uL (ref 4.0–10.5)

## 2013-12-27 LAB — URINALYSIS, ROUTINE W REFLEX MICROSCOPIC
BILIRUBIN URINE: NEGATIVE
Glucose, UA: NEGATIVE mg/dL
HGB URINE DIPSTICK: NEGATIVE
KETONES UR: NEGATIVE mg/dL
NITRITE: NEGATIVE
PROTEIN: 30 mg/dL — AB
Specific Gravity, Urine: 1.012 (ref 1.005–1.030)
UROBILINOGEN UA: 0.2 mg/dL (ref 0.0–1.0)
pH: 6.5 (ref 5.0–8.0)

## 2013-12-27 LAB — PREGNANCY, URINE: Preg Test, Ur: NEGATIVE

## 2013-12-27 LAB — URINE MICROSCOPIC-ADD ON

## 2013-12-27 LAB — POCT PREGNANCY, URINE: Preg Test, Ur: NEGATIVE

## 2013-12-27 MED ORDER — FLUCONAZOLE 100 MG PO TABS: 100.0000 mg | ORAL_TABLET | ORAL | Status: DC

## 2013-12-27 MED ORDER — ONDANSETRON HCL 4 MG PO TABS: 4.0000 mg | ORAL_TABLET | Freq: Four times a day (QID) | ORAL | Status: DC | PRN

## 2013-12-27 MED ORDER — ONDANSETRON HCL 4 MG/2ML IJ SOLN: 4.0000 mg | Freq: Four times a day (QID) | INTRAMUSCULAR | Status: DC | PRN

## 2013-12-27 MED ORDER — ONDANSETRON HCL 4 MG/2ML IJ SOLN: 4.0000 mg | Freq: Three times a day (TID) | INTRAMUSCULAR | Status: DC | PRN

## 2013-12-27 MED ORDER — SODIUM CHLORIDE 0.9 % IV SOLN
INTRAVENOUS | Status: AC
  Administered 2013-12-27: via INTRAVENOUS

## 2013-12-27 MED ORDER — DEXTROSE 5 % IV SOLN
1.0000 g | INTRAVENOUS | Status: DC
  Administered 2013-12-27: 1 g via INTRAVENOUS
  Filled 2013-12-27 (×2): qty 10

## 2013-12-27 MED ORDER — ACETAMINOPHEN 325 MG PO TABS
650.0000 mg | ORAL_TABLET | Freq: Four times a day (QID) | ORAL | Status: DC | PRN
  Administered 2013-12-27: 650 mg via ORAL
  Filled 2013-12-27: qty 2

## 2013-12-27 MED ORDER — BENZTROPINE MESYLATE 2 MG PO TABS
2.0000 mg | ORAL_TABLET | Freq: Every day | ORAL | Status: DC
Start: 2013-12-28 — End: 2013-12-29
  Administered 2013-12-28 – 2013-12-29 (×2): 2 mg via ORAL
  Filled 2013-12-27 (×2): qty 1

## 2013-12-27 MED ORDER — ONDANSETRON HCL 4 MG/2ML IJ SOLN
4.0000 mg | Freq: Once | INTRAMUSCULAR | Status: AC
  Administered 2013-12-27: 4 mg via INTRAVENOUS
  Filled 2013-12-27: qty 2

## 2013-12-27 MED ORDER — AZITHROMYCIN 200 MG/5ML PO SUSR: 1200.0000 mg | ORAL | Status: DC

## 2013-12-27 MED ORDER — SODIUM CHLORIDE 0.9 % IV SOLN
INTRAVENOUS | Status: DC
  Administered 2013-12-28: 50 mL/h via INTRAVENOUS
  Administered 2013-12-29: 07:00:00 via INTRAVENOUS

## 2013-12-27 MED ORDER — OXYCODONE HCL 5 MG PO TABS: 5.0000 mg | ORAL_TABLET | ORAL | Status: DC | PRN

## 2013-12-27 MED ORDER — OSELTAMIVIR PHOSPHATE 75 MG PO CAPS
75.0000 mg | ORAL_CAPSULE | Freq: Two times a day (BID) | ORAL | Status: DC
Start: 2013-12-27 — End: 2013-12-28
  Administered 2013-12-27: 75 mg via ORAL
  Filled 2013-12-27 (×3): qty 1

## 2013-12-27 MED ORDER — ALBUTEROL SULFATE HFA 108 (90 BASE) MCG/ACT IN AERS
2.0000 | INHALATION_SPRAY | Freq: Four times a day (QID) | RESPIRATORY_TRACT | Status: DC | PRN
Start: 2013-12-27 — End: 2013-12-27

## 2013-12-27 MED ORDER — ATOVAQUONE 750 MG/5ML PO SUSP
750.0000 mg | Freq: Every day | ORAL | Status: DC
  Administered 2013-12-28 – 2013-12-29 (×2): 750 mg via ORAL
  Filled 2013-12-27 (×2): qty 5

## 2013-12-27 MED ORDER — RITONAVIR 100 MG PO TABS
100.0000 mg | ORAL_TABLET | Freq: Every day | ORAL | Status: DC
  Administered 2013-12-28 – 2013-12-29 (×2): 100 mg via ORAL
  Filled 2013-12-27 (×3): qty 1

## 2013-12-27 MED ORDER — ACETAMINOPHEN 325 MG PO TABS
650.0000 mg | ORAL_TABLET | Freq: Four times a day (QID) | ORAL | Status: DC | PRN
  Administered 2013-12-28: 650 mg via ORAL
  Filled 2013-12-27: qty 2

## 2013-12-27 MED ORDER — ACETAMINOPHEN 650 MG RE SUPP
650.0000 mg | Freq: Four times a day (QID) | RECTAL | Status: DC | PRN
Start: 2013-12-27 — End: 2013-12-29

## 2013-12-27 MED ORDER — ALBUTEROL SULFATE (2.5 MG/3ML) 0.083% IN NEBU
2.5000 mg | INHALATION_SOLUTION | RESPIRATORY_TRACT | Status: DC | PRN
Start: 2013-12-27 — End: 2013-12-29

## 2013-12-27 MED ORDER — DARUNAVIR ETHANOLATE 800 MG PO TABS
800.0000 mg | ORAL_TABLET | Freq: Every day | ORAL | Status: DC
  Administered 2013-12-28 – 2013-12-29 (×2): 800 mg via ORAL
  Filled 2013-12-27 (×3): qty 1

## 2013-12-27 MED ORDER — OLANZAPINE 10 MG PO TBDP
20.0000 mg | ORAL_TABLET | Freq: Every day | ORAL | Status: DC
  Administered 2013-12-27 – 2013-12-28 (×2): 20 mg via ORAL
  Filled 2013-12-27 (×3): qty 2

## 2013-12-27 MED ORDER — SODIUM CHLORIDE 0.9 % IV BOLUS (SEPSIS)
1000.0000 mL | Freq: Once | INTRAVENOUS | Status: AC
  Administered 2013-12-27: 1000 mL via INTRAVENOUS

## 2013-12-27 MED ORDER — ENOXAPARIN SODIUM 40 MG/0.4ML ~~LOC~~ SOLN
40.0000 mg | Freq: Every day | SUBCUTANEOUS | Status: DC
  Administered 2013-12-27: 40 mg via SUBCUTANEOUS
  Filled 2013-12-27 (×3): qty 0.4

## 2013-12-27 MED ORDER — EMTRICITABINE-TENOFOVIR DF 200-300 MG PO TABS
1.0000 | ORAL_TABLET | Freq: Every day | ORAL | Status: DC
  Administered 2013-12-28 – 2013-12-29 (×2): 1 via ORAL
  Filled 2013-12-27 (×2): qty 1

## 2013-12-27 NOTE — H&P (Signed)
[facility] History and Physical  [name] ZOX:[id number] DOB: 1976-09-03 DOA: 12/27/2013  Referring physician: Leonard Schwartz, ER physician PCP: Philis Fendt, MD   Chief Complaint: Fever & weakness  HPI: [name] is a 38 y.o. female  Past medical history of hepatitis B, bipolar disorder and AIDS who has reportedly been noncompliant with her medications at times presents with several days of fever, weakness and diarrhea. The emergency room she was noted to have a documented fever of greater than 102. Patient had CD4 counts done approximately 2 months ago which are practically nonexistent. Patient states that she has been compliant with her medications, however the emergency room physician discussed this with the infectious disease doctor who reviewed her chart and reports history of noncompliance.  In the emergency room, patient's chest x-ray was unremarkable. Her white count was normal but within 84% shift. She was also noted to have a hemoglobin of 9 with an MCV of 70, consistent with previous hemoglobins. Fluid suspected in PCR has been sent. Hospitalists were called for further evaluation and admission.   Review of Systems:  Patient seen in emergency room. Currently she is doing okay. She denies any headaches, vision changes, dysphagia, chest pain or palpitations. She does complain of a mild cough, and nonproductive. She denies any wheezing or shortness of breath. She feels slightly winded with exertion though. She denies abdominal pain, hematuria, dysuria, constipation. She been having some intermittent episodes of diarrhea in the last few days. She also feels feverish and not much appetite and overall generally weak, but no focal extremity numbness, weakness or pain. Review systems otherwise negative.  Past Medical History  Diagnosis Date  . HIV positive   . Hepatitis B     /E-chart  . Microcytic anemia     h/o per E-chart  . Pneumonia 02/2009    bilaterlly; most  likely consistent w/pneumocystis carinii/e-chart  . Noncompliance with medication regimen     /e-chart  . 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 Pyelonephritis     h/o per E-chart  . Thyroid disease    Past Surgical History  Procedure Laterality Date  . Thyroid surgery    . Thyroid lobectomy  08/2002    left & isthmectomy; for benign thyroid adenoma/E-chart  . Tubal ligation  07/2002    /E-chart  . Esophagogastroduodenoscopy N/A 07/13/2013    Procedure: ESOPHAGOGASTRODUODENOSCOPY (EGD);  Surgeon: Beryle Beams, MD;  Location: [facility];  Service: Endoscopy;  Laterality: N/A;   Social History:  reports that she has never smoked. She has never used smokeless tobacco. She reports that she does not drink alcohol or use illicit drugs.  She lives at home with her sons.   Allergies  Allergen Reactions  . Bactrim Itching  . Dapsone Itching  . Orange Fruit Itching  . Peanut-Containing Drug Products Hives  . Shellfish Allergy Hives    Family Hx: According to pt, no family medical problems.  Prior to Admission medications   Medication Sig Start Date End Date Taking? Authorizing Provider  albuterol (PROVENTIL HFA;VENTOLIN HFA) 108 (90 BASE) MCG/ACT inhaler Inhale 2 puffs into the lungs every 6 (six) hours as needed for wheezing.   Yes Historical Provider, MD  atovaquone (MEPRON) 750 MG/5ML suspension Take 5 mLs (750 mg total) by mouth daily. 09/14/13  Yes Carlyle Basques, MD  azithromycin (ZITHROMAX) 200 MG/5ML suspension Take 30 mLs (1,200 mg total) by mouth once a week. 09/14/13  Yes  Carlyle Basques, MD  benztropine (COGENTIN) 2 MG tablet Take 2 mg by mouth daily.   Yes Historical Provider, MD  Darunavir Ethanolate (PREZISTA) 800 MG tablet Take 1 tablet (800 mg total) by mouth daily with breakfast. 08/31/13  Yes Michel Bickers, MD  emtricitabine-tenofovir (TRUVADA) 200-300 MG per tablet Take 1 tablet by mouth daily. 08/31/13  Yes Michel Bickers, MD  fluconazole  (DIFLUCAN) 100 MG tablet Take 100 mg by mouth once a week.   Yes Historical Provider, MD  Multiple Vitamins-Minerals (WOMENS MULTI VITAMIN & MINERAL PO) Take 2 each by mouth daily. Gummy vitamins   Yes Historical Provider, MD  OLANZapine zydis (ZYPREXA) 20 MG disintegrating tablet Take 20 mg by mouth at bedtime.   Yes Historical Provider, MD  ritonavir (NORVIR) 100 MG TABS tablet Take 1 tablet (100 mg total) by mouth daily with breakfast. 08/31/13  Yes Michel Bickers, MD   Physical Exam: Filed Vitals:   12/27/13 2214  BP: 120/73  Pulse: 81  Temp: 99.6 F (37.6 C)  Resp:     BP 120/73  Pulse 81  Temp(Src) 99.6 F (37.6 C) (Oral)  Resp 16  Ht 6' (1.829 m)  Wt 78.971 kg (174 lb 1.6 oz)  BMI 23.61 kg/m2  SpO2 100%  LMP 12/06/2013  General:  Alert and oriented x3, no acute distress, fatigued HEENT: Normocephalic and atraumatic, mucous membranes are moist Cardiovascular: Regular rate and rhythm, S1-S2 Lungs: Clear to auscultation bilaterally Abdomen: Soft, nontender, nondistended, positive bowel sounds Extremities: No clubbing or cyanosis or edema. Psychiatry: Patient is appropriate, no evidence of psychoses Neurologic: No focal deficits Skin: No skin breaks, tears or lesions or rashes           Labs on Admission:  Basic Metabolic Panel:  Recent Labs Lab 12/27/13 1719  NA 137  K 3.7  CL 99  CO2 23  GLUCOSE 111*  BUN 6  CREATININE 0.92  CALCIUM 8.5   Liver Function Tests:  Recent Labs Lab 12/27/13 1719  AST 19  ALT 11  ALKPHOS 109  BILITOT 0.8  PROT 8.1  ALBUMIN 3.5   CBC:  Recent Labs Lab 12/27/13 1719  WBC 6.9  NEUTROABS 5.8  HGB 9.0*  HCT 27.9*  MCV 70.6*  PLT 296    Radiological Exams on Admission: Dg Chest 2 View  12/27/2013    IMPRESSION: Normal and stable examination.   Electronically Signed   By: Evangeline Dakin M.D.   On: 12/27/2013 18:22    Assessment/Plan Principal Problem:   Fever: Suspect flu. Await flu PCR, but treat with  Tamiflu in the meantime Active Problems:   Bipolar disorder: Continue home meds   Hepatitis B   AIDS: Continue antiretrovirals and suppressive antibiotic therapy   UTI (urinary tract infection): IV Rocephin, await cultures Microcytic anemia: Likely anemia of chronic disease, stable   Code Status: Full Code  Family Communication: No family present  Disposition Plan: Home once stable-dx of fever, likely several days  Time spent: 30 min  [facility] Pager [phone]

## 2013-12-27 NOTE — ED Notes (Signed)
MD at bedside. 

## 2013-12-27 NOTE — ED Notes (Signed)
MD Aware of elevated temp. Will recheck at 1900 if elevated will administer 400mg  of Ibuprofen.

## 2013-12-27 NOTE — ED Notes (Addendum)
Pt c/o cough, fever, weakness, n/v/d x 2 days. States anytime she attempts to eat anything it comes directly back up. C/o generalized body aches and chills

## 2013-12-27 NOTE — ED Provider Notes (Addendum)
CSN: [id number]     Arrival date & time 12/27/13  1641 History   First MD Initiated Contact with Patient 12/27/13 1744     Chief Complaint  Patient presents with  . Fever  . Cough  . Emesis  . Weakness  . Diarrhea    HPI Patient presents with two-day history of cough, fever, nausea vomiting, weakness.  Has been vomiting when she tries to eat anything.  Patient has significant past medical history of HIV with noncompliance.  Head is elevated viral load with low CD4/L. counts Past Medical History  Diagnosis Date  . HIV positive   . Hepatitis B     /E-chart  . Microcytic anemia     h/o per E-chart  . 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 Noncompliance with medication regimen     /e-chart  . 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 Pyelonephritis     h/o per E-chart  . Thyroid disease    Past Surgical History  Procedure Laterality Date  . Thyroid surgery    . Thyroid lobectomy  08/2002    left & isthmectomy; for benign thyroid adenoma/E-chart  . Tubal ligation  07/2002    /E-chart  . Esophagogastroduodenoscopy N/A 07/13/2013    Procedure: ESOPHAGOGASTRODUODENOSCOPY (EGD);  Surgeon: Beryle Beams, MD;  Location: [facility];  Service: Endoscopy;  Laterality: N/A;   History reviewed. No pertinent family history. History  Substance Use Topics  . Smoking status: Never Smoker   . Smokeless tobacco: Never Used  . Alcohol Use: No   OB History   Grav Para Term Preterm Abortions TAB SAB Ect Mult Living                 Review of Systems  All other systems reviewed and are negative.    Allergies  Bactrim; Dapsone; Orange fruit; Peanut-containing drug products; and Shellfish allergy  Home Medications   Current Outpatient Rx  Name  Route  Sig  Dispense  Refill  . albuterol (PROVENTIL HFA;VENTOLIN HFA) 108 (90 BASE) MCG/ACT inhaler   Inhalation   Inhale 2 puffs into the lungs every 6 (six) hours  as needed for wheezing.         Marland Kitchen atovaquone (MEPRON) 750 MG/5ML suspension   Oral   Take 5 mLs (750 mg total) by mouth daily.   210 mL   6   . azithromycin (ZITHROMAX) 200 MG/5ML suspension   Oral   Take 30 mLs (1,200 mg total) by mouth once a week.   120 mL   3   . benztropine (COGENTIN) 2 MG tablet   Oral   Take 2 mg by mouth daily.         . Darunavir Ethanolate (PREZISTA) 800 MG tablet   Oral   Take 1 tablet (800 mg total) by mouth daily with breakfast.   30 tablet   6   . emtricitabine-tenofovir (TRUVADA) 200-300 MG per tablet   Oral   Take 1 tablet by mouth daily.   30 tablet   6   . fluconazole (DIFLUCAN) 100 MG tablet   Oral   Take 100 mg by mouth once a week.         . Multiple Vitamins-Minerals (WOMENS MULTI VITAMIN & MINERAL PO)   Oral   Take 2 each by mouth daily. Gummy vitamins         . OLANZapine zydis (ZYPREXA) 20  MG disintegrating tablet   Oral   Take 20 mg by mouth at bedtime.         . ritonavir (NORVIR) 100 MG TABS tablet   Oral   Take 1 tablet (100 mg total) by mouth daily with breakfast.   30 tablet   6    BP 121/65  Pulse 79  Temp(Src) 100.8 F (38.2 C) (Oral)  Resp 16  SpO2 98%  LMP 12/06/2013 Physical Exam  Nursing note and vitals reviewed. Constitutional: She is oriented to person, place, and time. She appears well-developed and well-nourished. She appears ill. No distress.  HENT:  Head: Normocephalic and atraumatic.  Eyes: Pupils are equal, round, and reactive to light.  Neck: Normal range of motion.  Cardiovascular: Normal rate and intact distal pulses.   Pulmonary/Chest: No respiratory distress.  Abdominal: Normal appearance. She exhibits no distension. There is no tenderness. There is no rigidity, no rebound, no guarding, no tenderness at McBurney's point and negative Murphy's sign.  Musculoskeletal: Normal range of motion.  Neurological: She is alert and oriented to person, place, and time. No cranial nerve  deficit.  Skin: Skin is warm and dry. No rash noted.  Psychiatric: She has a normal mood and affect. Her behavior is normal.    ED Course  Procedures (including critical care time) Labs Review Labs Reviewed  CBC WITH DIFFERENTIAL - Abnormal; Notable for the following:    Hemoglobin 9.0 (*)    HCT 27.9 (*)    MCV 70.6 (*)    MCH 22.8 (*)    RDW 15.6 (*)    Neutrophils Relative % 84 (*)    Lymphocytes Relative 8 (*)    Lymphs Abs 0.5 (*)    All other components within normal limits  COMPREHENSIVE METABOLIC PANEL - Abnormal; Notable for the following:    Glucose, Bld 111 (*)    GFR calc non Af Amer 79 (*)    All other components within normal limits  URINALYSIS, ROUTINE W REFLEX MICROSCOPIC - Abnormal; Notable for the following:    APPearance CLOUDY (*)    Protein, ur 30 (*)    Leukocytes, UA MODERATE (*)    All other components within normal limits  URINE MICROSCOPIC-ADD ON - Abnormal; Notable for the following:    Squamous Epithelial / LPF MANY (*)    All other components within normal limits  CULTURE, BLOOD (ROUTINE X 2)  CULTURE, BLOOD (ROUTINE X 2)  PREGNANCY, URINE  INFLUENZA PANEL BY PCR (TYPE A & B, H1N1)  POCT PREGNANCY, URINE   Imaging Review Dg Chest 2 View  IMPRESSION: Normal and stable examination.    Electronically Signed   By: Evangeline Dakin M.D.   On: 12/27/2013 18:22    Discussed with infectious disease who felt that admission would be judicial a lot of back and she is immunocompromised running stitch fever.  MDM   1. Fever   2. HIV (human immunodeficiency virus infection)          Madison Lanes, MD 12/27/13 2024

## 2013-12-28 DIAGNOSIS — R059 Cough, unspecified: Secondary | ICD-10-CM

## 2013-12-28 DIAGNOSIS — R05 Cough: Secondary | ICD-10-CM

## 2013-12-28 DIAGNOSIS — B1910 Unspecified viral hepatitis B without hepatic coma: Secondary | ICD-10-CM

## 2013-12-28 DIAGNOSIS — R509 Fever, unspecified: Secondary | ICD-10-CM

## 2013-12-28 DIAGNOSIS — J111 Influenza due to unidentified influenza virus with other respiratory manifestations: Secondary | ICD-10-CM | POA: Diagnosis present

## 2013-12-28 DIAGNOSIS — D509 Iron deficiency anemia, unspecified: Secondary | ICD-10-CM

## 2013-12-28 DIAGNOSIS — B9789 Other viral agents as the cause of diseases classified elsewhere: Secondary | ICD-10-CM

## 2013-12-28 LAB — BASIC METABOLIC PANEL
BUN: 6 mg/dL (ref 6–23)
CHLORIDE: 102 meq/L (ref 96–112)
CO2: 22 mEq/L (ref 19–32)
Calcium: 7.7 mg/dL — ABNORMAL LOW (ref 8.4–10.5)
Creatinine, Ser: 0.89 mg/dL (ref 0.50–1.10)
GFR calc Af Amer: >90 mL/min (ref >90)
GFR calc non Af Amer: 82 mL/min — ABNORMAL LOW (ref >90)
GLUCOSE: 102 mg/dL — AB (ref 70–99)
Potassium: 3.5 mEq/L — ABNORMAL LOW (ref 3.7–5.3)
SODIUM: 137 meq/L (ref 137–147)

## 2013-12-28 LAB — CBC
HCT: 24.9 % — ABNORMAL LOW (ref 36.0–46.0)
Hemoglobin: 8.1 g/dL — ABNORMAL LOW (ref 12.0–15.0)
MCH: 22.8 pg — ABNORMAL LOW (ref 26.0–34.0)
MCHC: 32.5 g/dL (ref 30.0–36.0)
MCV: 70.1 fL — AB (ref 78.0–100.0)
PLATELETS: 267 10*3/uL (ref 150–400)
RBC: 3.55 MIL/uL — ABNORMAL LOW (ref 3.87–5.11)
RDW: 15.5 % (ref 11.5–15.5)
WBC: 6.5 10*3/uL (ref 4.0–10.5)

## 2013-12-28 LAB — HEPATITIS B SURFACE ANTIGEN: Hepatitis B Surface Ag: NEGATIVE

## 2013-12-28 LAB — CLOSTRIDIUM DIFFICILE BY PCR: CDIFFPCR: NEGATIVE

## 2013-12-28 LAB — INFLUENZA PANEL BY PCR (TYPE A & B)
H1N1 flu by pcr: NOT DETECTED
INFLBPCR: NEGATIVE
Influenza A By PCR: NEGATIVE

## 2013-12-28 LAB — HEPATITIS C ANTIBODY: HCV Ab: NEGATIVE

## 2013-12-28 LAB — HEPATITIS B SURFACE ANTIBODY,QUALITATIVE: Hep B S Ab: NEGATIVE

## 2013-12-28 MED ORDER — OSELTAMIVIR PHOSPHATE 75 MG PO CAPS
75.0000 mg | ORAL_CAPSULE | Freq: Two times a day (BID) | ORAL | Status: DC
  Administered 2013-12-28 – 2013-12-29 (×3): 75 mg via ORAL
  Filled 2013-12-28 (×4): qty 1

## 2013-12-28 NOTE — Progress Notes (Addendum)
[facility] PROGRESS NOTE    [name] RXV:[id number] DOB: 1976/10/14 DOA: 12/27/2013 PCP: Philis Fendt, MD  HPI/Brief narrative 38 year old with history of HIV/AIDS, poor compliance, last CD4 10/2013 of <10, viral load of 136,000, PCP pneumonia, hepatitis B, bipolar disorder was admitted with complaints of high fevers, nausea, vomiting and diarrhea. In the ED, chest x-ray unremarkable, WBC normal, hemoglobin 9 and patient was admitted for flulike illness.  Assessment/Plan:  Fever/flulike illness - Patient was placed on droplet isolation and empirically started on Tamiflu. She does not clinically look septic or toxic. Flu panel PCR subsequently came back negative. Infectious disease M.D. and subsequently consulted and continued Tamiflu for possible influenza despite negative testing. Rocephin which was started for presumed UTI has been discontinued. Continue supportive care. - Follow stool C. difficile PCR and GI pathogen panel PCR. Nausea, vomiting and diarrhea seemed to have abated. Patient apparently has been on weekly amoxicillin. - Patient had temperature of 102.79F this morning. Will monitor additional night and if stable, DC home in a.m.  HIV/AIDS  - Infectious M.D. consultation appreciated. Restart Prezista, Norvir, Truvada. No signs or symptoms of PCP pneumonia with nl CXR, not hypoxic  History of hepatitis B - ID checking hepatitis B labs.  Microcytic anemia - Stable. Follow CBC in a.m.  Noncompliance  - Counseled.   History of bipolar disorder - Continue home medications    Code Status: Full Family Communication: None at bedside Disposition Plan: Home when stable, possibly 1/14   Consultants:  ID  Procedures:  None  Antibiotics:  Rocephin - DC'ed  Tamiflu  Prezista, Norvir, Truvada  Subjective: Feels better. Fever improved. No nausea, vomiting or diarrhea since admission.   Objective: Filed Vitals:   12/27/13 2214 12/28/13 0554  12/28/13 1209 12/28/13 1457  BP: 120/73 115/69  103/68  Pulse: 81 92  80  Temp: 99.6 F (37.6 C) 102.8 F (39.3 C) 98.1 F (36.7 C) 97.7 F (36.5 C)  TempSrc: Oral Oral Oral Oral  Resp:    18  Height: 6' (1.829 m)     Weight: 78.971 kg (174 lb 1.6 oz)     SpO2: 100% 98%  100%    Intake/Output Summary (Last 24 hours) at 12/28/13 1654 Last data filed at 12/28/13 0600  Gross per 24 hour  Intake 854.17 ml  Output      0 ml  Net 854.17 ml   Filed Weights   12/27/13 2214  Weight: 78.971 kg (174 lb 1.6 oz)     Exam:  General exam: Young female, does not look septic or toxic. Comfortable  Respiratory system: Clear. No increased work of breathing. Cardiovascular system: S1 & S2 heard, RRR. No JVD, murmurs, gallops, clicks or pedal edema. Gastrointestinal system: Abdomen is nondistended, soft and nontender. Normal bowel sounds heard. Central nervous system: Alert and oriented. No focal neurological deficits. Extremities: Symmetric 5 x 5 power.   Data Reviewed: Basic Metabolic Panel:  Recent Labs Lab 12/27/13 1719 12/28/13 0550  NA 137 137  K 3.7 3.5*  CL 99 102  CO2 23 22  GLUCOSE 111* 102*  BUN 6 6  CREATININE 0.92 0.89  CALCIUM 8.5 7.7*   Liver Function Tests:  Recent Labs Lab 12/27/13 1719  AST 19  ALT 11  ALKPHOS 109  BILITOT 0.8  PROT 8.1  ALBUMIN 3.5   No results found for this basename: LIPASE, AMYLASE,  in the last 168 hours No results found for this basename: AMMONIA,  in the last 168 hours  CBC:  Recent Labs Lab 12/27/13 1719 12/28/13 0550  WBC 6.9 6.5  NEUTROABS 5.8  --   HGB 9.0* 8.1*  HCT 27.9* 24.9*  MCV 70.6* 70.1*  PLT 296 267   Cardiac Enzymes: No results found for this basename: CKTOTAL, CKMB, CKMBINDEX, TROPONINI,  in the last 168 hours BNP (last 3 results) No results found for this basename: PROBNP,  in the last 8760 hours CBG: No results found for this basename: GLUCAP,  in the last 168 hours  Recent Results (from the  past 240 hour(s))  CULTURE, BLOOD (ROUTINE X 2)     Status: None   Collection Time    12/27/13  6:07 PM      Result Value Range Status   Specimen Description BLOOD LEFT ARM   Final   Special Requests BOTTLES DRAWN AEROBIC ONLY 5ML   Final   Culture  Setup Time     Final   Value: 12/27/2013 21:57     Performed at [facility]   Culture     Final   Value:        BLOOD CULTURE RECEIVED NO GROWTH TO DATE CULTURE WILL BE HELD FOR 5 DAYS BEFORE ISSUING A FINAL NEGATIVE REPORT     Performed at [facility]   Report Status PENDING   Incomplete  CULTURE, BLOOD (ROUTINE X 2)     Status: None   Collection Time    12/27/13  6:12 PM      Result Value Range Status   Specimen Description BLOOD RIGHT ARM   Final   Special Requests BOTTLES DRAWN AEROBIC ONLY 5ML   Final   Culture  Setup Time     Final   Value: 12/27/2013 21:56     Performed at [facility]   Culture     Final   Value:        BLOOD CULTURE RECEIVED NO GROWTH TO DATE CULTURE WILL BE HELD FOR 5 DAYS BEFORE ISSUING A FINAL NEGATIVE REPORT     Performed at [facility]   Report Status PENDING   Incomplete      Additional labs: 1. Urine pregnancy test: Negative 2. Influenza panel PCR: Negative     Studies: Dg Chest 2 View  12/27/2013   CLINICAL DATA:  2 day history of cough and fever. Immunocompromised patient with HIV.  EXAM: CHEST  2 VIEW  COMPARISON:  CTA chest 07/04/2013. Two-view chest x-ray 06/18/2013, 03/30/2013, 03/10/2002.  FINDINGS: Cardiomediastinal silhouette unremarkable. Lungs clear. Bronchovascular markings normal. Pulmonary vascularity normal. No pneumothorax. No pleural effusions. Visualized bony thorax intact. No significant interval change.  IMPRESSION: Normal and stable examination.   Electronically Signed   By: Evangeline Dakin M.D.   On: 12/27/2013 18:22        Scheduled Meds: . atovaquone  750 mg Oral Daily  . [START ON 01/03/2014] azithromycin  1,200 mg Oral Weekly  .  benztropine  2 mg Oral Daily  . Darunavir Ethanolate  800 mg Oral Q breakfast  . emtricitabine-tenofovir  1 tablet Oral Daily  . enoxaparin (LOVENOX) injection  40 mg Subcutaneous QHS  . [START ON 01/01/2014] fluconazole  100 mg Oral Weekly  . OLANZapine zydis  20 mg Oral QHS  . oseltamivir  75 mg Oral BID  . ritonavir  100 mg Oral Q breakfast   Continuous Infusions: . sodium chloride 50 mL/hr (12/28/13 0719)    Principal Problem:   Fever Active Problems:   Bipolar disorder  Hepatitis B   AIDS   UTI (urinary tract infection)    Time spent: 39 minutes    HONGALGI,ANAND, MD, FACP, FHM. [facility] Pager [phone]  If 7PM-7AM, please contact night-coverage [email or web address] Password [email or web address] 12/28/2013, 4:54 PM    LOS: 1 day

## 2013-12-28 NOTE — Progress Notes (Signed)
INITIAL NUTRITION ASSESSMENT  DOCUMENTATION CODES Per approved criteria  -Not Applicable   INTERVENTION: - Diet advancement per MD - Will continue to monitor   NUTRITION DIAGNOSIS: Inadequate oral intake related to inability to eat as evidenced by NPO.   Goal: Advance diet as tolerated to regular diet  Monitor:  Weights, labs, diet advancement  Reason for Assessment: Malnutrition screening tool   38 y.o. female  Admitting Dx: Fever  ASSESSMENT: Pt with history of hepatitis B, bipolar disorder and AIDS who has reportedly been noncompliant with her medications at times presents with several days of fever, weakness and diarrhea.   Met with pt who reports poor appetite for the past 2 days. States she had some vomiting and 4 episodes of diarrhea yesterday - none today and no nausea. States she lost 5 pounds during this time frame. States before this all occurred she was eating well, 3 meals/day. Pt NPO, eager to have diet advanced. Denies eating anything out of the ordinary or being around sick contacts.   Potassium slightly low.  Height: Ht Readings from Last 1 Encounters:  12/27/13 6' (1.829 m)    Weight: Wt Readings from Last 1 Encounters:  12/27/13 174 lb 1.6 oz (78.971 kg)    Ideal Body Weight: 160 lb   % Ideal Body Weight: 109%  Wt Readings from Last 10 Encounters:  12/27/13 174 lb 1.6 oz (78.971 kg)  09/14/13 159 lb (72.122 kg)  08/30/13 149 lb (67.586 kg)  08/10/13 138 lb 8 oz (62.823 kg)  07/09/13 125 lb 7.1 oz (56.9 kg)  07/09/13 125 lb 7.1 oz (56.9 kg)  04/16/12 155 lb (70.308 kg)  04/01/12 147 lb 8 oz (66.906 kg)  03/16/12 144 lb 6.4 oz (65.5 kg)  02/20/12 154 lb 12 oz (70.194 kg)    Usual Body Weight: 179 lb per pt  % Usual Body Weight: 97%  BMI:  Body mass index is 23.61 kg/(m^2).  Estimated Nutritional Needs: Kcal: 4235-3614 Protein: 95-115g Fluid: 1.9-2.1L/day  Skin: Intact   Diet Order: NPO  EDUCATION NEEDS: -No education needs  identified at this time   Intake/Output Summary (Last 24 hours) at 12/28/13 1043 Last data filed at 12/28/13 0600  Gross per 24 hour  Intake 854.17 ml  Output      0 ml  Net 854.17 ml    Last BM: 1/12 - diarrhea  Labs:   Recent Labs Lab 12/27/13 1719 12/28/13 0550  NA 137 137  K 3.7 3.5*  CL 99 102  CO2 23 22  BUN 6 6  CREATININE 0.92 0.89  CALCIUM 8.5 7.7*  GLUCOSE 111* 102*    CBG (last 3)  No results found for this basename: GLUCAP,  in the last 72 hours  Scheduled Meds: . sodium chloride   Intravenous STAT  . atovaquone  750 mg Oral Daily  . [START ON 01/03/2014] azithromycin  1,200 mg Oral Weekly  . benztropine  2 mg Oral Daily  . cefTRIAXone (ROCEPHIN)  IV  1 g Intravenous Q24H  . Darunavir Ethanolate  800 mg Oral Q breakfast  . emtricitabine-tenofovir  1 tablet Oral Daily  . enoxaparin (LOVENOX) injection  40 mg Subcutaneous QHS  . [START ON 01/01/2014] fluconazole  100 mg Oral Weekly  . OLANZapine zydis  20 mg Oral QHS  . ritonavir  100 mg Oral Q breakfast    Continuous Infusions: . sodium chloride 50 mL/hr (12/28/13 0719)    Past Medical History  Diagnosis Date  . HIV positive   .  Hepatitis B     /E-chart  . Microcytic anemia     h/o per E-chart  . 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 Noncompliance with medication regimen     /e-chart  . 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 Pyelonephritis     h/o per E-chart  . Thyroid disease     Past Surgical History  Procedure Laterality Date  . Thyroid surgery    . Thyroid lobectomy  08/2002    left & isthmectomy; for benign thyroid adenoma/E-chart  . Tubal ligation  07/2002    /E-chart  . Esophagogastroduodenoscopy N/A 07/13/2013    Procedure: ESOPHAGOGASTRODUODENOSCOPY (EGD);  Surgeon: Beryle Beams, MD;  Location: [facility];  Service: Endoscopy;  Laterality: N/A;    Mikey College MS, [place], [place]  Pager [phone] After Hours Pager

## 2013-12-28 NOTE — Progress Notes (Signed)
UR completed. Patient changed to inpatient r/t requiring IV antibiotics and IVF @50cc /hr

## 2013-12-28 NOTE — Consult Note (Addendum)
[facility] for Infectious Disease     Reason for Consult: AIDS, febrile illness    Referring Physician: Dr. Lavella Lemons  Principal Problem:   Fever Active Problems:   Bipolar disorder   Hepatitis B   AIDS   UTI (urinary tract infection)   . atovaquone  750 mg Oral Daily  . [START ON 01/03/2014] azithromycin  1,200 mg Oral Weekly  . benztropine  2 mg Oral Daily  . cefTRIAXone (ROCEPHIN)  IV  1 g Intravenous Q24H  . Darunavir Ethanolate  800 mg Oral Q breakfast  . emtricitabine-tenofovir  1 tablet Oral Daily  . enoxaparin (LOVENOX) injection  40 mg Subcutaneous QHS  . [START ON 01/01/2014] fluconazole  100 mg Oral Weekly  . OLANZapine zydis  20 mg Oral QHS  . ritonavir  100 mg Oral Q breakfast    Recommendations: Restart Prezista, Norvir, Truvada [employer] blood culture Supportive care and Tamiflu for possible influenza despite negative test D/c ceftriaxone Will check hepatitis B labs  Assessment: She has a febrile viral illness.  No signs or symptoms of PCP pneumonia with nl CXR, not hypoxic.  Fever but not significantly sick to suggest MAC in blood.   She does not appear ill and is able to eat and drink.  No diarrhea today, tolerated lunch.  Mild dry cough, no urinary symptoms.  Fever this am.   Has history of hepatitis B, history of PCP pneumonia.    OK from an ID standpoint for discharge.  We will follow her closely with an appointment with Dr. Megan Salon next Monday (we will arrange, she has trouble going on Tue, Wed or Thur).    Antibiotics: ceftriaxone  HPI: [name] is a 38 y.o. female with HIV/AIDS, poor compliance with last CD4 in 10/2013 of <10, viral load of 136,000 supposedly on a regimen of Prezista, norvir and Truvada since April of 2014 but obvioulsy non-compliant, here with fever.  For two days has noted mild cough, fever to 103, diarrhea, decreased po intake.  Reports compliance with her medications but her vl is at her baseline and CD4 < 10 in Nov 2014.   This has been a chronic problem.     Review of Systems: A comprehensive review of systems was negative.  Past Medical History  Diagnosis Date  . HIV positive   . Hepatitis B     /E-chart  . Microcytic anemia     h/o per E-chart  . 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 Noncompliance with medication regimen     /e-chart  . 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 Pyelonephritis     h/o per E-chart  . Thyroid disease     History  Substance Use Topics  . Smoking status: Never Smoker   . Smokeless tobacco: Never Used  . Alcohol Use: No    History reviewed. No pertinent family history. Allergies  Allergen Reactions  . Bactrim Itching  . Dapsone Itching  . Orange Fruit Itching  . Peanut-Containing Drug Products Hives  . Shellfish Allergy Hives    OBJECTIVE: Blood pressure 115/69, pulse 92, temperature 98.1 F (36.7 C), temperature source Oral, resp. rate 16, height 6' (1.829 m), weight 174 lb 1.6 oz (78.971 kg), last menstrual period 12/06/2013, SpO2 98.00%. General: Awake, alert, appears in nad, pleasant Skin: no rashes Lungs: CTA B Cor: RRR without m./r/g Abdomen: soft, nt, nd, +normoactive bowel sounds Ext:  no edema  Microbiology: Recent Results (from the past 240 hour(s))  CULTURE, BLOOD (ROUTINE X 2)     Status: None   Collection Time    12/27/13  6:07 PM      Result Value Range Status   Specimen Description BLOOD LEFT ARM   Final   Special Requests BOTTLES DRAWN AEROBIC ONLY 5ML   Final   Culture  Setup Time     Final   Value: 12/27/2013 21:57     Performed at [facility]   Culture     Final   Value:        BLOOD CULTURE RECEIVED NO GROWTH TO DATE CULTURE WILL BE HELD FOR 5 DAYS BEFORE ISSUING A FINAL NEGATIVE REPORT     Performed at [facility]   Report Status PENDING   Incomplete  CULTURE, BLOOD (ROUTINE X 2)     Status: None   Collection Time    12/27/13   6:12 PM      Result Value Range Status   Specimen Description BLOOD RIGHT ARM   Final   Special Requests BOTTLES DRAWN AEROBIC ONLY 5ML   Final   Culture  Setup Time     Final   Value: 12/27/2013 21:56     Performed at [facility]   Culture     Final   Value:        BLOOD CULTURE RECEIVED NO GROWTH TO DATE CULTURE WILL BE HELD FOR 5 DAYS BEFORE ISSUING A FINAL NEGATIVE REPORT     Performed at [facility]   Report Status PENDING   Incomplete    COMER, Herbie [place], [place] [facility] [email or web address] [phone] pager  [phone] cell 12/28/2013, 1:42 PM

## 2013-12-29 DIAGNOSIS — J111 Influenza due to unidentified influenza virus with other respiratory manifestations: Principal | ICD-10-CM

## 2013-12-29 DIAGNOSIS — B20 Human immunodeficiency virus [HIV] disease: Secondary | ICD-10-CM

## 2013-12-29 LAB — CBC
HEMATOCRIT: 24.6 % — AB (ref 36.0–46.0)
Hemoglobin: 8.2 g/dL — ABNORMAL LOW (ref 12.0–15.0)
MCH: 23.2 pg — AB (ref 26.0–34.0)
MCHC: 33.3 g/dL (ref 30.0–36.0)
MCV: 69.5 fL — AB (ref 78.0–100.0)
Platelets: 287 10*3/uL (ref 150–400)
RBC: 3.54 MIL/uL — ABNORMAL LOW (ref 3.87–5.11)
RDW: 15.6 % — AB (ref 11.5–15.5)
WBC: 3.8 10*3/uL — AB (ref 4.0–10.5)

## 2013-12-29 LAB — GI PATHOGEN PANEL BY PCR, STOOL
C DIFFICILE TOXIN A/B: NEGATIVE
CRYPTOSPORIDIUM BY PCR: NEGATIVE
Campylobacter by PCR: NEGATIVE
E COLI (STEC): NEGATIVE
E COLI 0157 BY PCR: NEGATIVE
E coli (ETEC) LT/ST: NEGATIVE
G lamblia by PCR: NEGATIVE
Norovirus GI/GII: NEGATIVE
Rotavirus A by PCR: POSITIVE
Salmonella by PCR: NEGATIVE
Shigella by PCR: NEGATIVE

## 2013-12-29 LAB — BASIC METABOLIC PANEL
BUN: 6 mg/dL (ref 6–23)
CALCIUM: 8.1 mg/dL — AB (ref 8.4–10.5)
CHLORIDE: 107 meq/L (ref 96–112)
CO2: 22 mEq/L (ref 19–32)
CREATININE: 0.8 mg/dL (ref 0.50–1.10)
GFR calc non Af Amer: >90 mL/min (ref >90)
Glucose, Bld: 106 mg/dL — ABNORMAL HIGH (ref 70–99)
Potassium: 3.8 mEq/L (ref 3.7–5.3)
Sodium: 141 mEq/L (ref 137–147)

## 2013-12-29 MED ORDER — OSELTAMIVIR PHOSPHATE 75 MG PO CAPS: 75.0000 mg | ORAL_CAPSULE | Freq: Two times a day (BID) | ORAL | Status: DC

## 2013-12-29 NOTE — Discharge Summary (Signed)
Physician Discharge Summary  [name] ZOX:[id number] DOB: [birth year] DOA: 12/27/2013  PCP: Philis Fendt, MD  Admit date: 12/27/2013 Discharge date: 12/29/2013  Time spent: 40 minutes  Recommendations for Outpatient Follow-up:  1. Home with outpt follow up  Discharge Diagnoses:  Principal Problem:   Flu syndrome  Active Problems:   Microcytic anemia   Bipolar disorder   Hepatitis B   Fever   AIDS   Discharge Condition: fair  Diet recommendation: regular  Filed Weights   12/27/13 2214  Weight: 78.971 kg (174 lb 1.6 oz)    History of present illness:  Please refer to admission H&P for details, but in brief, 39 year old with history of HIV/AIDS, poor compliance, last CD4 10/2013 of <10, viral load of 136,000, PCP pneumonia, hepatitis B, bipolar disorder was admitted with complaints of high fevers, nausea, vomiting and diarrhea. In the ED, chest x-ray unremarkable, WBC normal, hemoglobin 9 and patient was admitted for flulike illness.   Hospital Course:  Patient was placed on droplet isolation and empirically started on Tamiflu.   Flu panel PCR subsequently came back negative. Infectious disease consulted and recommended to continued Tamiflu for possible influenza despite negative testing. Plan to treat for 5 days.  Rocephin which was started for presumed UTI has been discontinued.  Stool for c diff negative -blood cx , AFB cx negative. gastroenteritis symptoms resolved. -fever resolved.    HIV/AIDS  -. Restarted Prezista, Norvir, Truvada. No signs or symptoms of PCP pneumonia  History of hepatitis B  - ID checking hepatitis b surface ag negative.  Microcytic anemia  - Stable.  Noncompliance  - Counseled patient  History of bipolar disorder  - Continue home medications   Patient stable for discharge with outpt follow up  Code Status: Full  Family Communication: None at bedside  Disposition Plan: Home with outpt ID follow up  Consultants:   ID Procedures:  None Antibiotics:  Rocephin - DC'ed  Tamiflu  Prezista, Norvir, Truvada     Discharge Exam: Filed Vitals:   12/29/13 0614  BP: 112/71  Pulse: 76  Temp: 99.5 F (37.5 C)  Resp: 20    General: middle aged female in NAD HEENT: no pallor, moist mucosa  chest: clear b/l , no added sounds CVS: NS1&S2, no murmurs, rubs or gallop Abd: soft, NT, ND, BS+ EXT: warm, no edema  CNS: AAOX3   Discharge Instructions     Medication List         albuterol 108 (90 BASE) MCG/ACT inhaler  Commonly known as:  PROVENTIL HFA;VENTOLIN HFA  Inhale 2 puffs into the lungs every 6 (six) hours as needed for wheezing.     atovaquone 750 MG/5ML suspension  Commonly known as:  MEPRON  Take 5 mLs (750 mg total) by mouth daily.     azithromycin 200 MG/5ML suspension  Commonly known as:  ZITHROMAX  Take 30 mLs (1,200 mg total) by mouth once a week.     benztropine 2 MG tablet  Commonly known as:  COGENTIN  Take 2 mg by mouth daily.     Darunavir Ethanolate 800 MG tablet  Commonly known as:  PREZISTA  Take 1 tablet (800 mg total) by mouth daily with breakfast.     emtricitabine-tenofovir 200-300 MG per tablet  Commonly known as:  TRUVADA  Take 1 tablet by mouth daily.     fluconazole 100 MG tablet  Commonly known as:  DIFLUCAN  Take 100 mg by mouth once a week.  OLANZapine zydis 20 MG disintegrating tablet  Commonly known as:  ZYPREXA  Take 20 mg by mouth at bedtime.     oseltamivir 75 MG capsule  Commonly known as:  TAMIFLU  Take 1 capsule (75 mg total) by mouth 2 (two) times daily.( until 1/18)     ritonavir 100 MG Tabs tablet  Commonly known as:  NORVIR  Take 1 tablet (100 mg total) by mouth daily with breakfast.     WOMENS MULTI VITAMIN & MINERAL PO  Take 2 each by mouth daily. Gummy vitamins       Allergies  Allergen Reactions  . Bactrim Itching  . Dapsone Itching  . Orange Fruit Itching  . Peanut-Containing Drug Products Hives  . Shellfish  Allergy Hives       Follow-up Information   Follow up with Philis Fendt, MD.   Specialty:  Internal Medicine   Contact information:   [place] [address] [phone]       Follow up with Michel Bickers, MD In 1 week.   Specialty:  Infectious Diseases   Contact information:   [address] [place] [address] [phone]        The results of significant diagnostics from this hospitalization (including imaging, microbiology, ancillary and laboratory) are listed below for reference.    Significant Diagnostic Studies: Dg Chest 2 View  12/27/2013   CLINICAL DATA:  2 day history of cough and fever. Immunocompromised patient with HIV.  EXAM: CHEST  2 VIEW  COMPARISON:  CTA chest 07/04/2013. Two-view chest x-ray 06/18/2013, 03/30/2013, 03/10/2002.  FINDINGS: Cardiomediastinal silhouette unremarkable. Lungs clear. Bronchovascular markings normal. Pulmonary vascularity normal. No pneumothorax. No pleural effusions. Visualized bony thorax intact. No significant interval change.  IMPRESSION: Normal and stable examination.   Electronically Signed   By: Evangeline Dakin M.D.   On: 12/27/2013 18:22    Microbiology: Recent Results (from the past 240 hour(s))  CULTURE, BLOOD (ROUTINE X 2)     Status: None   Collection Time    12/27/13  6:07 PM      Result Value Range Status   Specimen Description BLOOD LEFT ARM   Final   Special Requests BOTTLES DRAWN AEROBIC ONLY 5ML   Final   Culture  Setup Time     Final   Value: 12/27/2013 21:57     Performed at [facility]   Culture     Final   Value:        BLOOD CULTURE RECEIVED NO GROWTH TO DATE CULTURE WILL BE HELD FOR 5 DAYS BEFORE ISSUING A FINAL NEGATIVE REPORT     Performed at [facility]   Report Status PENDING   Incomplete  CULTURE, BLOOD (ROUTINE X 2)     Status: None   Collection Time    12/27/13  6:12 PM      Result Value Range Status   Specimen Description BLOOD RIGHT ARM    Final   Special Requests BOTTLES DRAWN AEROBIC ONLY 5ML   Final   Culture  Setup Time     Final   Value: 12/27/2013 21:56     Performed at [facility]   Culture     Final   Value:        BLOOD CULTURE RECEIVED NO GROWTH TO DATE CULTURE WILL BE HELD FOR 5 DAYS BEFORE ISSUING A FINAL NEGATIVE REPORT     Performed at [facility]   Report Status PENDING  Incomplete  CLOSTRIDIUM DIFFICILE BY PCR     Status: None   Collection Time    12/28/13  2:55 PM      Result Value Range Status   C difficile by pcr NEGATIVE  NEGATIVE Final   Comment: Performed at [facility]  AFB CULTURE, BLOOD     Status: None   Collection Time    12/28/13  5:53 PM      Result Value Range Status   Specimen Description BLOOD RIGHT ARM   Final   Special Requests BOTTLES DRAWN AEROBIC ONLY 10CC   Final   Culture     Final   Value: CULTURE WILL BE EXAMINED FOR 6 WEEKS BEFORE ISSUING A FINAL REPORT     Performed at [facility]   Report Status PENDING   Incomplete     Labs: Basic Metabolic Panel:  Recent Labs Lab 12/27/13 1719 12/28/13 0550 12/29/13 0605  NA 137 137 141  K 3.7 3.5* 3.8  CL 99 102 107  CO2 23 22 22   GLUCOSE 111* 102* 106*  BUN 6 6 6   CREATININE 0.92 0.89 0.80  CALCIUM 8.5 7.7* 8.1*   Liver Function Tests:  Recent Labs Lab 12/27/13 1719  AST 19  ALT 11  ALKPHOS 109  BILITOT 0.8  PROT 8.1  ALBUMIN 3.5   No results found for this basename: LIPASE, AMYLASE,  in the last 168 hours No results found for this basename: AMMONIA,  in the last 168 hours CBC:  Recent Labs Lab 12/27/13 1719 12/28/13 0550 12/29/13 0605  WBC 6.9 6.5 3.8*  NEUTROABS 5.8  --   --   HGB 9.0* 8.1* 8.2*  HCT 27.9* 24.9* 24.6*  MCV 70.6* 70.1* 69.5*  PLT 296 267 287   Cardiac Enzymes: No results found for this basename: CKTOTAL, CKMB, CKMBINDEX, TROPONINI,  in the last 168 hours BNP: BNP (last 3 results) No results found for this basename: PROBNP,  in the last 8760  hours CBG: No results found for this basename: GLUCAP,  in the last 168 hours     Signed:  Louellen Molder  [facility] 12/29/2013, 12:36 PM

## 2013-12-30 ENCOUNTER — Other Ambulatory Visit: Payer: Self-pay | Admitting: *Deleted

## 2013-12-30 ENCOUNTER — Telehealth: Payer: Self-pay | Admitting: Internal Medicine

## 2013-12-30 MED ORDER — LEVOFLOXACIN 500 MG PO TABS: 500.0000 mg | ORAL_TABLET | Freq: Every day | ORAL | Status: DC

## 2013-12-30 NOTE — Telephone Encounter (Signed)
Can you call the patient and see how she is doing after her hospitalization for flu like symptoms.  She has now a positive blood culture for GNR I suspect is a urinary source.  Please start her on levaquin 500 mg po daily for 5 days until her appt with Dr. Megan Salon.  Thanks

## 2013-12-30 NOTE — Telephone Encounter (Signed)
Sent prescription to her pharmacy Levaquin 500mg  Qday #5 R0.  Left patient message asking her how she was, notifying her of her prescription, reminding her of her follow up with Dr. Megan Salon Monday. Landis Gandy, RN

## 2013-12-30 NOTE — Progress Notes (Signed)
Informed by the lab today that  patient's blood culture from 1/12 growing gram-negative rods in 1/2  bottle.  Called patient and she denies any headache, blurred vision, dizziness, nausea, vomiting, fever, chills, abdominal pain, dysuria or diarrhea. She's still has some cough with whitish phlegm. I spoke with ID consult with Dr.Comer and he will review the culture results. Culture and sensitivity pending. She has appt with Dr Megan Salon on 1/19. Patient instructed to return to ED if she has any of the above mentioned symptoms.

## 2014-01-01 LAB — CULTURE, BLOOD (ROUTINE X 2)

## 2014-01-02 LAB — CULTURE, BLOOD (ROUTINE X 2): Culture: NO GROWTH

## 2014-01-03 ENCOUNTER — Telehealth: Payer: Self-pay | Admitting: *Deleted

## 2014-01-03 ENCOUNTER — Other Ambulatory Visit: Payer: Self-pay | Admitting: Internal Medicine

## 2014-01-03 ENCOUNTER — Encounter: Payer: Self-pay | Admitting: Internal Medicine

## 2014-01-03 ENCOUNTER — Ambulatory Visit (INDEPENDENT_AMBULATORY_CARE_PROVIDER_SITE_OTHER): Payer: Medicaid Other | Admitting: Internal Medicine

## 2014-01-03 VITALS — BP 149/89 | HR 78 | Temp 97.6°F | Ht 72.0 in | Wt 180.0 lb

## 2014-01-03 DIAGNOSIS — B20 Human immunodeficiency virus [HIV] disease: Secondary | ICD-10-CM

## 2014-01-03 LAB — COMPREHENSIVE METABOLIC PANEL
ALK PHOS: 76 U/L (ref 39–117)
ALT: 10 U/L (ref 0–35)
AST: 15 U/L (ref 0–37)
Albumin: 3.7 g/dL (ref 3.5–5.2)
BUN: 7 mg/dL (ref 6–23)
CO2: 24 meq/L (ref 19–32)
Calcium: 9 mg/dL (ref 8.4–10.5)
Chloride: 105 mEq/L (ref 96–112)
Creat: 0.68 mg/dL (ref 0.50–1.10)
GLUCOSE: 91 mg/dL (ref 70–99)
POTASSIUM: 3.8 meq/L (ref 3.5–5.3)
SODIUM: 139 meq/L (ref 135–145)
TOTAL PROTEIN: 6.6 g/dL (ref 6.0–8.3)
Total Bilirubin: 0.3 mg/dL (ref 0.3–1.2)

## 2014-01-03 LAB — LIPID PANEL
CHOLESTEROL: 155 mg/dL (ref 0–200)
HDL: 33 mg/dL — ABNORMAL LOW (ref >39)
LDL Cholesterol: 58 mg/dL (ref 0–99)
Total CHOL/HDL Ratio: 4.7 Ratio
Triglycerides: 318 mg/dL — ABNORMAL HIGH (ref <150)
VLDL: 64 mg/dL — AB (ref 0–40)

## 2014-01-03 LAB — CBC
HCT: 26.5 % — ABNORMAL LOW (ref 36.0–46.0)
Hemoglobin: 8.8 g/dL — ABNORMAL LOW (ref 12.0–15.0)
MCH: 23.1 pg — AB (ref 26.0–34.0)
MCHC: 33.2 g/dL (ref 30.0–36.0)
MCV: 69.6 fL — AB (ref 78.0–100.0)
Platelets: 432 10*3/uL — ABNORMAL HIGH (ref 150–400)
RBC: 3.81 MIL/uL — ABNORMAL LOW (ref 3.87–5.11)
RDW: 17.3 % — ABNORMAL HIGH (ref 11.5–15.5)
WBC: 3.7 10*3/uL — ABNORMAL LOW (ref 4.0–10.5)

## 2014-01-03 MED ORDER — LEVOFLOXACIN 500 MG PO TABS: 500.0000 mg | ORAL_TABLET | Freq: Every day | ORAL | Status: DC

## 2014-01-03 MED ORDER — DEXLANSOPRAZOLE 60 MG PO CPDR: 60.0000 mg | DELAYED_RELEASE_CAPSULE | Freq: Every day | ORAL | Status: DC

## 2014-01-03 NOTE — Telephone Encounter (Signed)
Prior Approval for Dexilant obtained through 12/29/14. Authorization # [id number]. Approval faxed to [facility], [place]. Myrtis Hopping

## 2014-01-03 NOTE — Progress Notes (Signed)
Patient ID: [name], female   DOB: [birth year], 38 y.o.   MRN: [id number]          [facility] for Infectious Disease  Patient Active Problem List   Diagnosis Date Noted  . Unintentional weight loss 07/10/2013    Priority: High  . Esophageal ulcer 07/10/2013    Priority: High  . Bipolar disorder 04/01/2012    Priority: High  . HIV INFECTION 02/28/2009    Priority: High  . Flu syndrome 12/28/2013  . Fever 12/27/2013  . AIDS 12/27/2013  . Dysphagia 08/30/2013  . 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 Hepatitis B 07/10/2013  . Elevated lipase 07/10/2013  . Elevated alkaline phosphatase level 07/10/2013  . GENITAL HERPES 02/28/2009  . Microcytic anemia 02/27/2009    Patient's Medications  New Prescriptions   No medications on file  Previous Medications   ALBUTEROL (PROVENTIL HFA;VENTOLIN HFA) 108 (90 BASE) MCG/ACT INHALER    Inhale 2 puffs into the lungs every 6 (six) hours as needed for wheezing.   AZITHROMYCIN (ZITHROMAX) 200 MG/5ML SUSPENSION    Take 30 mLs (1,200 mg total) by mouth once a week.   BENZTROPINE (COGENTIN) 2 MG TABLET    Take 2 mg by mouth daily.   DARUNAVIR ETHANOLATE (PREZISTA) 800 MG TABLET    Take 1 tablet (800 mg total) by mouth daily with breakfast.   EMTRICITABINE-TENOFOVIR (TRUVADA) 200-300 MG PER TABLET    Take 1 tablet by mouth daily.   FLUCONAZOLE (DIFLUCAN) 100 MG TABLET    Take 100 mg by mouth once a week.   MULTIPLE VITAMINS-MINERALS (WOMENS MULTI VITAMIN & MINERAL PO)    Take 2 each by mouth daily. Gummy vitamins   OLANZAPINE ZYDIS (ZYPREXA) 20 MG DISINTEGRATING TABLET    Take 20 mg by mouth at bedtime.   RITONAVIR (NORVIR) 100 MG TABS TABLET    Take 1 tablet (100 mg total) by mouth daily with breakfast.  Modified Medications   Modified Medication Previous Medication   ATOVAQUONE (MEPRON) 750 MG/5ML SUSPENSION atovaquone (MEPRON) 750 MG/5ML suspension      Take 1,500 mg by mouth daily.    Take 5 mLs (750 mg total) by mouth  daily.   DEXLANSOPRAZOLE (DEXILANT) 60 MG CAPSULE dexlansoprazole (DEXILANT) 60 MG capsule      Take 1 capsule (60 mg total) by mouth daily.    Take 60 mg by mouth daily.   LEVOFLOXACIN (LEVAQUIN) 500 MG TABLET levofloxacin (LEVAQUIN) 500 MG tablet      Take 1 tablet (500 mg total) by mouth daily.    Take 1 tablet (500 mg total) by mouth daily.  Discontinued Medications   OSELTAMIVIR (TAMIFLU) 75 MG CAPSULE    Take 1 capsule (75 mg total) by mouth 2 (two) times daily.    Subjective: [name] is in for her hospital followup visit. She developed high fevers last week associated with some mild left flank pain. She was admitted with a temperature of 103. The source for fever was initially unclear. Although her influenza PCR was negative she was treated empirically with Tamiflu and discharged home. After discharge one of 2 admission blood cultures grew Escherichia coli. A prescription for levofloxacin was called in on January 15 and a message was left on her cell phone but she did not check her messages and has not started levofloxacin. She is feeling much better and has had no further fever or flank pain. She says that she has been taking her medications recently. She can  name all of her medications and describes taking them correctly. She said she did miss about 1 week of her HIV medicines earlier this month when she got too stressed and did not take them while at work. She does work around 7:30 each morning and but has been taking her medicines around 1145 a.m. She is using a pill box.  She believes it's been over one year since she saw her mental health counselor. She continues to take Zyprexa but still struggles with intermittent depression and extreme stress.  Review of Systems: Constitutional: negative Eyes: negative Ears, nose, mouth, throat, and face: negative Respiratory: negative Cardiovascular: negative Gastrointestinal: negative Genitourinary:negative  Past Medical History  Diagnosis  Date  . HIV positive   . Hepatitis B     /E-chart  . Microcytic anemia     h/o per E-chart  . 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 Noncompliance with medication regimen     /e-chart  . 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 Pyelonephritis     h/o per E-chart  . Thyroid disease     History  Substance Use Topics  . Smoking status: Never Smoker   . Smokeless tobacco: Never Used  . Alcohol Use: No    No family history on file.  Allergies  Allergen Reactions  . Bactrim Itching  . Dapsone Itching  . Orange Fruit Itching  . Peanut-Containing Drug Products Hives  . Shellfish Allergy Hives    Objective: Temp: 97.6 F (36.4 C) (01/19 1447) Temp src: Oral (01/19 1447) BP: 149/89 mmHg (01/19 1447) Pulse Rate: 78 (01/19 1447)  General: She is well dressed and in no distress Oral: No oropharyngeal lesions Skin: No rash Lungs: Clear Cor: Regular S1-S2 no murmurs Abdomen: Soft and nontender. No CVA tenderness Joints and extremities: Normal Neuro: Alert and oriented with normal speech and conversation Mood and affect: Normal  Lab Results Lab Results  Component Value Date   WBC 3.8* 12/29/2013   HGB 8.2* 12/29/2013   HCT 24.6* 12/29/2013   MCV 69.5* 12/29/2013   PLT 287 12/29/2013    Lab Results  Component Value Date   CREATININE 0.80 12/29/2013   BUN 6 12/29/2013   NA 141 12/29/2013   K 3.8 12/29/2013   CL 107 12/29/2013   CO2 22 12/29/2013    Lab Results  Component Value Date   ALT 11 12/27/2013   AST 19 12/27/2013   ALKPHOS 109 12/27/2013   BILITOT 0.8 12/27/2013    Lab Results  Component Value Date   CHOL 162 10/18/2013   HDL 40 10/18/2013   LDLCALC 88 10/18/2013   TRIG 169* 10/18/2013   CHOLHDL 4.1 10/18/2013    Lab Results HIV 1 RNA Quant (copies/mL)  Date Value  10/18/2013 009381*  08/10/2013 829937*  07/10/2013 169678*     CD4 T Cell Abs (/uL)  Date Value  10/18/2013 <10*    08/10/2013 <10*  07/10/2013 <10*     Assessment: Still struggling with adherence. I will continue her current antiretroviral regimen and have her switch to take it every morning before she goes to work. Also asked her to set herself on alarm to remind her. I will check. Lab work today.  I will have her discontinue Tamiflu and pick up her prescription for levofloxacin.  I will set her up to see one of her mental health counselors here soon as possible.  Plan: 1. Continue  current antiretroviral medications but change time of day she takes some 2. Check lab work today 3. Discontinue Tamiflu 4. Start levofloxacin for 5 days 5. Refilled Dexilant 6. Refer to our mental health counselor 7. Followup in 2 weeks   Michel Bickers, MD [facility] [place] [phone] pager   [phone] cell 01/03/2014, 3:10 PM

## 2014-01-04 ENCOUNTER — Ambulatory Visit (INDEPENDENT_AMBULATORY_CARE_PROVIDER_SITE_OTHER): Payer: Medicaid Other | Admitting: *Deleted

## 2014-01-04 DIAGNOSIS — F319 Bipolar disorder, unspecified: Secondary | ICD-10-CM

## 2014-01-04 LAB — HIV-1 RNA QUANT-NO REFLEX-BLD
HIV 1 RNA Quant: 10300 copies/mL — ABNORMAL HIGH (ref <20)
HIV-1 RNA Quant, Log: 4.01 {Log} — ABNORMAL HIGH (ref <1.30)

## 2014-01-04 LAB — RPR

## 2014-01-04 NOTE — Progress Notes (Signed)
Patient ID: [name], female   DOB: Jun 08, 1976, 38 y.o.   MRN: [id number] [name] presented oriented x 4, well-focused, cordial [name] informative. She explained that Dr. Megan [name] wanted her to reconnect with her psychiatric services provider since she had recently stabilized [name] wants to preserve those gains in mood stability. [name] reports being on two medications for her bipolar disorder. They have been largely beneficial in managing her illness although the mood stabilizer she feels needs a dosage adjustment. Writer contacted [name] at patient's request since she said they had treated her mood disorder previously. [facility] reported having [name]'s file although it had been closed due to inactivity. Consequently, they said she would just need to present as a walk-in any morning at 8:00 a.m. [name] the intervention of her family as well as her feelings about that. [name] had mixed feelings indicating that she understood why her family took action to have her hospitalized, but she felt they did it in such a way that it caused logistics problems for her in insuring that her two sons were cared for during that time. However, patient expressed valuing the psyc care she received [name] the benefit of the medication she took. Pt was encouraged to contact this writer as needed [name]/or to schedule additional support sessions in the interim until she can see [name]. Patient stated that she is actually quite stable presently, is working Psychologist, counselling. These findings/plans will be communicated to her [name], [name].  [name], [name]

## 2014-01-05 ENCOUNTER — Other Ambulatory Visit: Payer: Self-pay | Admitting: Internal Medicine

## 2014-01-05 DIAGNOSIS — B20 Human immunodeficiency virus [HIV] disease: Secondary | ICD-10-CM

## 2014-01-05 LAB — T-HELPER CELL (CD4) - (RCID CLINIC ONLY)
CD4 T CELL HELPER: 2 % — AB (ref 33–55)
CD4 T Cell Abs: 10 /uL — ABNORMAL LOW (ref 400–2700)

## 2014-01-05 NOTE — Addendum Note (Signed)
Addended byMarlis Edelson on: 01/05/2014 02:08 PM   Modules accepted: Orders

## 2014-01-07 ENCOUNTER — Ambulatory Visit (INDEPENDENT_AMBULATORY_CARE_PROVIDER_SITE_OTHER): Payer: Medicaid Other | Admitting: *Deleted

## 2014-01-07 ENCOUNTER — Other Ambulatory Visit (HOSPITAL_COMMUNITY)
Admission: RE | Admit: 2014-01-07 | Discharge: 2014-01-07 | Disposition: A | Discharge status: Home / Self Care | Payer: Medicaid Other | Source: Ambulatory Visit | Attending: Internal Medicine | Admitting: Internal Medicine

## 2014-01-07 DIAGNOSIS — R87612 Low grade squamous intraepithelial lesion on cytologic smear of cervix (LGSIL): Secondary | ICD-10-CM

## 2014-01-07 DIAGNOSIS — Z113 Encounter for screening for infections with a predominantly sexual mode of transmission: Secondary | ICD-10-CM | POA: Insufficient documentation

## 2014-01-07 DIAGNOSIS — Z124 Encounter for screening for malignant neoplasm of cervix: Secondary | ICD-10-CM

## 2014-01-07 DIAGNOSIS — N760 Acute vaginitis: Secondary | ICD-10-CM | POA: Insufficient documentation

## 2014-01-07 DIAGNOSIS — Z803 Family history of malignant neoplasm of breast: Secondary | ICD-10-CM

## 2014-01-07 DIAGNOSIS — Z01419 Encounter for gynecological examination (general) (routine) without abnormal findings: Secondary | ICD-10-CM | POA: Insufficient documentation

## 2014-01-07 NOTE — Progress Notes (Signed)
  Subjective:     [name] is a 38 y.o. woman who comes in today for a  pap smear only.  Previous abnormal Pap smears: yes. Contraception:condoms.  C/O milky, itching odiferous vaginal discharge.  Having vaginal herpes outbreaks every 3-4 months.  Very painful.   Objective:    LMP 12/06/2013 Pelvic Exam:  Pap smear obtained.   Assessment:    Screening pap smear.   Plan:    Follow up in one year, or as indicated by Pap results.  Pt given educational materials re: HIV and women, self-esteem, BSE, nutrition and diet management, PAP smears and partner safety.

## 2014-01-07 NOTE — Patient Instructions (Signed)
  Your results will be ready in about a week.  If there is an infection present Dr. Megan Salon will order treatment and send to [facility] on [address].  I will send you a letter later about your PAP smear results or you may look them up on MyChart.  Thank you for coming to the Center for your care.  Langley Gauss, RN

## 2014-01-11 ENCOUNTER — Encounter: Payer: Self-pay | Admitting: Internal Medicine

## 2014-01-11 DIAGNOSIS — N870 Mild cervical dysplasia: Secondary | ICD-10-CM | POA: Insufficient documentation

## 2014-01-11 NOTE — Addendum Note (Signed)
Addended by: Lorne Skeens D on: 01/11/2014 11:58 AM   Modules accepted: Orders

## 2014-01-13 ENCOUNTER — Ambulatory Visit (INDEPENDENT_AMBULATORY_CARE_PROVIDER_SITE_OTHER): Payer: Medicaid Other | Admitting: Internal Medicine

## 2014-01-13 VITALS — BP 142/84 | HR 88 | Temp 97.8°F | Wt 180.0 lb

## 2014-01-13 DIAGNOSIS — B20 Human immunodeficiency virus [HIV] disease: Secondary | ICD-10-CM

## 2014-01-13 MED ORDER — ATOVAQUONE 750 MG/5ML PO SUSP: 1500.0000 mg | Freq: Every day | ORAL | Status: DC

## 2014-01-13 MED ORDER — AZITHROMYCIN 600 MG PO TABS: 1200.0000 mg | ORAL_TABLET | ORAL | Status: DC

## 2014-01-13 NOTE — Progress Notes (Signed)
Patient ID: [name], female   DOB: 1976/08/11, 38 y.o.   MRN: [id number]          [facility]  Patient Active Problem List   Diagnosis Date Noted  . CIN I (cervical intraepithelial neoplasia I) 01/11/2014    Priority: High  . Unintentional weight loss 07/10/2013    Priority: High  . Bipolar disorder 04/01/2012    Priority: High  . HIV INFECTION 02/28/2009    Priority: High  . Dysphagia 08/30/2013  . 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 Hepatitis B 07/10/2013  . Elevated lipase 07/10/2013  . Elevated alkaline phosphatase level 07/10/2013  . Esophageal ulcer 07/10/2013  . GENITAL HERPES 02/28/2009  . Microcytic anemia 02/27/2009    Patient's Medications  New Prescriptions   No medications on file  Previous Medications   ALBUTEROL (PROVENTIL HFA;VENTOLIN HFA) 108 (90 BASE) MCG/ACT INHALER    Inhale 2 puffs into the lungs every 6 (six) hours as needed for wheezing.   BENZTROPINE (COGENTIN) 2 MG TABLET    Take 2 mg by mouth daily.   DARUNAVIR ETHANOLATE (PREZISTA) 800 MG TABLET    Take 1 tablet (800 mg total) by mouth daily with breakfast.   DEXLANSOPRAZOLE (DEXILANT) 60 MG CAPSULE    Take 1 capsule (60 mg total) by mouth daily.   EMTRICITABINE-TENOFOVIR (TRUVADA) 200-300 MG PER TABLET    Take 1 tablet by mouth daily.   FLUCONAZOLE (DIFLUCAN) 100 MG TABLET    Take 100 mg by mouth once a week.   MULTIPLE VITAMINS-MINERALS (WOMENS MULTI VITAMIN & MINERAL PO)    Take 2 each by mouth daily. Gummy vitamins   OLANZAPINE ZYDIS (ZYPREXA) 20 MG DISINTEGRATING TABLET    Take 20 mg by mouth at bedtime.   RITONAVIR (NORVIR) 100 MG TABS TABLET    Take 1 tablet (100 mg total) by mouth daily with breakfast.  Modified Medications   Modified Medication Previous Medication   ATOVAQUONE (MEPRON) 750 MG/5ML SUSPENSION atovaquone (MEPRON) 750 MG/5ML suspension      Take 10 mLs (1,500 mg total) by mouth daily.    Take 1,500 mg by mouth daily.   AZITHROMYCIN (ZITHROMAX) 600 MG TABLET azithromycin (ZITHROMAX) 600 MG tablet      Take 2 tablets (1,200 mg total) by mouth every 7 (seven) days.    Take 2 tablets (1,200 mg total) by mouth every 7 (seven) days.  Discontinued Medications   AZITHROMYCIN (ZITHROMAX) 200 MG/5ML SUSPENSION    Take 30 mLs (1,200 mg total) by mouth once a week.   LEVOFLOXACIN (LEVAQUIN) 500 MG TABLET    Take 1 tablet (500 mg total) by mouth daily.    Subjective: Edwinna is in for a routine visit. She completed 5 days of levofloxacin recently and has had no more flank pain or fever. She states that she has been doing much better taking her HIV medications and has not missed a dose since her last visit. She takes her Truvada, Prezista and Norvir between 5 and 6 each morning. She remains stressed related to her work but denies any recent depression. She does have some occasional epigastric burning that it feels different than her acid reflux. She didn't restart Dexilant after her last visit. She does have occasional dysphagia with solid food but no pain on swallowing.  Review of Systems: Pertinent items are noted in HPI.  Past Medical History  Diagnosis Date  . HIV positive   . Hepatitis B     /  E-chart  . Microcytic anemia     h/o per E-chart  . 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 Noncompliance with medication regimen     /e-chart  . 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 Pyelonephritis     h/o per E-chart  . Thyroid disease   . Depression     History  Substance Use Topics  . Smoking status: Never Smoker   . Smokeless tobacco: Never Used  . Alcohol Use: No    Family History  Problem Relation Age of Onset  . Cancer Mother   . Diabetes Mother   . Diabetes Father   . Heart disease Father   . Cancer Sister   . Diabetes Sister   . Asthma Son     Allergies  Allergen Reactions  . Bactrim Itching  . Dapsone Itching  .  Orange Fruit Itching  . Peanut-Containing Drug Products Hives  . Shellfish Allergy Hives    Objective: Temp: 97.8 F (36.6 C) (01/29 1601) Temp src: Oral (01/29 1601) BP: 142/84 mmHg (01/29 1601) Pulse Rate: 88 (01/29 1601)  Body mass index is 24.41 kg/(m^2).  General: She is in no distress Oral: No oropharyngeal lesions Skin: No rash Lungs: Clear Cor: Regular S1 and S2 no murmurs Abdomen: Soft and nontender Joints and extremities: Normal Neuro: Alert with normal speech and conversation Mood and affect: Bright and normal  Lab Results Lab Results  Component Value Date   WBC 3.7* 01/03/2014   HGB 8.8* 01/03/2014   HCT 26.5* 01/03/2014   MCV 69.6* 01/03/2014   PLT 432* 01/03/2014    Lab Results  Component Value Date   CREATININE 0.68 01/03/2014   BUN 7 01/03/2014   NA 139 01/03/2014   K 3.8 01/03/2014   CL 105 01/03/2014   CO2 24 01/03/2014    Lab Results  Component Value Date   ALT 10 01/03/2014   AST 15 01/03/2014   ALKPHOS 76 01/03/2014   BILITOT 0.3 01/03/2014    Lab Results  Component Value Date   CHOL 155 01/03/2014   HDL 33* 01/03/2014   LDLCALC 58 01/03/2014   TRIG 318* 01/03/2014   CHOLHDL 4.7 01/03/2014    Lab Results HIV 1 RNA Quant (copies/mL)  Date Value  01/03/2014 10300*  10/18/2013 967893*  08/10/2013 810175*     CD4 T Cell Abs (/uL)  Date Value  01/03/2014 10*  10/18/2013 <10*  08/10/2013 <10*     Assessment: Her adherence has improved recently. I will continue her current antiretroviral regimen and see her back in 4 weeks.  Her bipolar disorder is currently well controlled.  It is possible that she has some esophageal stricture related to her recent esophageal ulcer or a peptic stricture. I will have her continue her proton pump inhibitor and simply follow her symptoms.  Her recent transient Escherichia coli bacteremia has resolved clinically.  Plan: 1. Continue current antiretroviral medications 2. Await results of genotype and integrase  resistance test 3. Followup in 4 weeks   Michel Bickers, MD [facility] [phone] pager   [phone] cell 01/13/2014, 5:13 PM

## 2014-01-14 LAB — HIV-1 INTEGRASE GENOTYPE

## 2014-01-14 LAB — HIV-1 GENOTYPR PLUS

## 2014-01-28 ENCOUNTER — Ambulatory Visit: Payer: Medicaid Other

## 2014-02-07 ENCOUNTER — Ambulatory Visit: Payer: Medicaid Other

## 2014-02-09 LAB — AFB CULTURE, BLOOD

## 2014-02-21 ENCOUNTER — Ambulatory Visit: Payer: Medicaid Other | Admitting: Internal Medicine

## 2014-02-28 ENCOUNTER — Telehealth: Payer: Self-pay | Admitting: *Deleted

## 2014-02-28 ENCOUNTER — Ambulatory Visit: Payer: Medicaid Other | Admitting: Internal Medicine

## 2014-02-28 NOTE — Telephone Encounter (Signed)
Made appt for Tues., March 24 at 4:00PM.

## 2014-03-08 ENCOUNTER — Ambulatory Visit: Payer: Medicaid Other | Admitting: Internal Medicine

## 2014-03-08 ENCOUNTER — Encounter: Payer: Self-pay | Admitting: *Deleted

## 2014-03-08 ENCOUNTER — Telehealth: Payer: Self-pay | Admitting: *Deleted

## 2014-03-08 NOTE — Telephone Encounter (Signed)
Mailbox full, left number to call back

## 2014-03-16 ENCOUNTER — Encounter: Payer: Medicaid Other | Admitting: Obstetrics & Gynecology

## 2014-03-16 NOTE — Progress Notes (Signed)
This encounter was created in error - please disregard.

## 2014-06-13 ENCOUNTER — Telehealth: Payer: Self-pay | Admitting: *Deleted

## 2014-06-13 NOTE — Telephone Encounter (Signed)
Received call from [facility]. They are unable to contact the patient to confirm delivery.  RN unable to contact patient, no working phone numbers.  RN called pt's emergency contact, confirmed accurate phone number.  RN asked to have patient call her doctor's office. Landis Gandy, RN

## 2014-06-24 ENCOUNTER — Telehealth: Payer: Self-pay | Admitting: *Deleted

## 2014-06-24 NOTE — Telephone Encounter (Signed)
Call from [facility] pharmacist; unable to refill patient's HIV meds since 05/12/14. No working phone number for patient. See previous phone note. Myrtis Hopping

## 2014-07-21 ENCOUNTER — Emergency Department (HOSPITAL_COMMUNITY)
Admission: EM | Admit: 2014-07-21 | Discharge: 2014-07-22 | Disposition: A | Discharge status: Home / Self Care | Payer: Medicaid Other | Attending: Emergency Medicine | Admitting: Emergency Medicine

## 2014-07-21 ENCOUNTER — Encounter (HOSPITAL_COMMUNITY): Payer: Self-pay | Admitting: Emergency Medicine

## 2014-07-21 DIAGNOSIS — Z862 Personal history of diseases of the blood and blood-forming organs and certain disorders involving the immune mechanism: Secondary | ICD-10-CM | POA: Insufficient documentation

## 2014-07-21 DIAGNOSIS — Z8742 Personal history of other diseases of the female genital tract: Secondary | ICD-10-CM | POA: Insufficient documentation

## 2014-07-21 DIAGNOSIS — Z8639 Personal history of other endocrine, nutritional and metabolic disease: Secondary | ICD-10-CM | POA: Diagnosis not present

## 2014-07-21 DIAGNOSIS — M940 Chondrocostal junction syndrome [Tietze]: Secondary | ICD-10-CM | POA: Diagnosis not present

## 2014-07-21 DIAGNOSIS — Z8619 Personal history of other infectious and parasitic diseases: Secondary | ICD-10-CM | POA: Insufficient documentation

## 2014-07-21 DIAGNOSIS — K2100 Gastro-esophageal reflux disease with esophagitis, without bleeding: Secondary | ICD-10-CM | POA: Diagnosis not present

## 2014-07-21 DIAGNOSIS — N3000 Acute cystitis without hematuria: Secondary | ICD-10-CM | POA: Diagnosis not present

## 2014-07-21 DIAGNOSIS — D509 Iron deficiency anemia, unspecified: Secondary | ICD-10-CM | POA: Diagnosis not present

## 2014-07-21 DIAGNOSIS — Z3202 Encounter for pregnancy test, result negative: Secondary | ICD-10-CM | POA: Insufficient documentation

## 2014-07-21 DIAGNOSIS — Z8701 Personal history of pneumonia (recurrent): Secondary | ICD-10-CM | POA: Insufficient documentation

## 2014-07-21 DIAGNOSIS — R1013 Epigastric pain: Secondary | ICD-10-CM | POA: Insufficient documentation

## 2014-07-21 DIAGNOSIS — Z8659 Personal history of other mental and behavioral disorders: Secondary | ICD-10-CM | POA: Insufficient documentation

## 2014-07-21 DIAGNOSIS — Z21 Asymptomatic human immunodeficiency virus [HIV] infection status: Secondary | ICD-10-CM | POA: Insufficient documentation

## 2014-07-21 HISTORY — DX: Unspecified asthma, uncomplicated: J45.909

## 2014-07-21 NOTE — ED Notes (Signed)
Pt complains of abdominal pain and burning in her chest since last week, pt states she was dx with an ulcer last year, pt states she hasn't eaten since Saturday

## 2014-07-22 ENCOUNTER — Emergency Department (HOSPITAL_COMMUNITY): Payer: Medicaid Other

## 2014-07-22 ENCOUNTER — Encounter (HOSPITAL_COMMUNITY): Payer: Self-pay | Admitting: Radiology

## 2014-07-22 LAB — COMPREHENSIVE METABOLIC PANEL
ALT: 9 U/L (ref 0–35)
ANION GAP: 16 — AB (ref 5–15)
AST: 30 U/L (ref 0–37)
Albumin: 3.7 g/dL (ref 3.5–5.2)
Alkaline Phosphatase: 88 U/L (ref 39–117)
BILIRUBIN TOTAL: 0.6 mg/dL (ref 0.3–1.2)
BUN: 11 mg/dL (ref 6–23)
CALCIUM: 9.5 mg/dL (ref 8.4–10.5)
CHLORIDE: 101 meq/L (ref 96–112)
CO2: 24 meq/L (ref 19–32)
CREATININE: 0.71 mg/dL (ref 0.50–1.10)
GLUCOSE: 98 mg/dL (ref 70–99)
Potassium: 3.5 mEq/L — ABNORMAL LOW (ref 3.7–5.3)
Sodium: 141 mEq/L (ref 137–147)
Total Protein: 8.4 g/dL — ABNORMAL HIGH (ref 6.0–8.3)

## 2014-07-22 LAB — POC URINE PREG, ED: Preg Test, Ur: NEGATIVE

## 2014-07-22 LAB — URINALYSIS, ROUTINE W REFLEX MICROSCOPIC
BILIRUBIN URINE: NEGATIVE
Glucose, UA: NEGATIVE mg/dL
NITRITE: POSITIVE — AB
PROTEIN: 100 mg/dL — AB
Specific Gravity, Urine: 1.016 (ref 1.005–1.030)
UROBILINOGEN UA: 1 mg/dL (ref 0.0–1.0)
pH: 6 (ref 5.0–8.0)

## 2014-07-22 LAB — CBC WITH DIFFERENTIAL/PLATELET
Basophils Absolute: 0 10*3/uL (ref 0.0–0.1)
Basophils Relative: 0 % (ref 0–1)
EOS PCT: 2 % (ref 0–5)
Eosinophils Absolute: 0.1 10*3/uL (ref 0.0–0.7)
HEMATOCRIT: 27.5 % — AB (ref 36.0–46.0)
Hemoglobin: 8.3 g/dL — ABNORMAL LOW (ref 12.0–15.0)
LYMPHS ABS: 0.7 10*3/uL (ref 0.7–4.0)
Lymphocytes Relative: 12 % (ref 12–46)
MCH: 19.6 pg — ABNORMAL LOW (ref 26.0–34.0)
MCHC: 30.2 g/dL (ref 30.0–36.0)
MCV: 64.9 fL — AB (ref 78.0–100.0)
MONO ABS: 0.6 10*3/uL (ref 0.1–1.0)
MONOS PCT: 10 % (ref 3–12)
NEUTROS ABS: 4.2 10*3/uL (ref 1.7–7.7)
Neutrophils Relative %: 76 % (ref 43–77)
Platelets: 319 10*3/uL (ref 150–400)
RBC: 4.24 MIL/uL (ref 3.87–5.11)
RDW: 18.7 % — ABNORMAL HIGH (ref 11.5–15.5)
WBC: 5.6 10*3/uL (ref 4.0–10.5)

## 2014-07-22 LAB — PREGNANCY, URINE: PREG TEST UR: NEGATIVE

## 2014-07-22 LAB — URINE MICROSCOPIC-ADD ON

## 2014-07-22 LAB — LIPASE, BLOOD: LIPASE: 18 U/L (ref 11–59)

## 2014-07-22 MED ORDER — FENTANYL CITRATE 0.05 MG/ML IJ SOLN
50.0000 ug | Freq: Once | INTRAMUSCULAR | Status: AC
  Administered 2014-07-22: 50 ug via INTRAVENOUS
  Filled 2014-07-22: qty 2

## 2014-07-22 MED ORDER — FENTANYL CITRATE 0.05 MG/ML IJ SOLN
25.0000 ug | Freq: Once | INTRAMUSCULAR | Status: AC
  Administered 2014-07-22: 25 ug via INTRAVENOUS
  Filled 2014-07-22: qty 2

## 2014-07-22 MED ORDER — FAMOTIDINE IN NACL 20-0.9 MG/50ML-% IV SOLN
20.0000 mg | Freq: Once | INTRAVENOUS | Status: AC
  Administered 2014-07-22: 20 mg via INTRAVENOUS
  Filled 2014-07-22: qty 50

## 2014-07-22 MED ORDER — CEPHALEXIN 250 MG/5ML PO SUSR: 500.0000 mg | Freq: Three times a day (TID) | ORAL | Status: AC

## 2014-07-22 MED ORDER — FLUCONAZOLE 40 MG/ML PO SUSR
200.0000 mg | Freq: Every day | ORAL | Status: DC
  Administered 2014-07-22: 200 mg via ORAL
  Filled 2014-07-22: qty 5

## 2014-07-22 MED ORDER — IOHEXOL 300 MG/ML  SOLN
100.0000 mL | Freq: Once | INTRAMUSCULAR | Status: AC | PRN
Start: 2014-07-22 — End: 2014-07-22
  Administered 2014-07-22: 100 mL via INTRAVENOUS

## 2014-07-22 MED ORDER — ONDANSETRON HCL 4 MG/2ML IJ SOLN
4.0000 mg | Freq: Once | INTRAMUSCULAR | Status: AC
  Administered 2014-07-22: 4 mg via INTRAVENOUS
  Filled 2014-07-22: qty 2

## 2014-07-22 MED ORDER — DEXTROSE 5 % IV SOLN
1.0000 g | Freq: Once | INTRAVENOUS | Status: AC
  Administered 2014-07-22: 1 g via INTRAVENOUS
  Filled 2014-07-22: qty 10

## 2014-07-22 MED ORDER — HYDROCODONE-ACETAMINOPHEN 7.5-325 MG/15ML PO SOLN: 15.0000 mL | Freq: Four times a day (QID) | ORAL | Status: DC | PRN

## 2014-07-22 MED ORDER — GI COCKTAIL ~~LOC~~
30.0000 mL | Freq: Once | ORAL | Status: AC
  Administered 2014-07-22: 30 mL via ORAL
  Filled 2014-07-22: qty 30

## 2014-07-22 MED ORDER — SODIUM CHLORIDE 0.9 % IV SOLN
1000.0000 mL | INTRAVENOUS | Status: DC
  Administered 2014-07-22: 1000 mL via INTRAVENOUS

## 2014-07-22 MED ORDER — SUCRALFATE 1 G PO TABS
1.0000 g | ORAL_TABLET | Freq: Once | ORAL | Status: AC
  Administered 2014-07-22: 1 g via ORAL
  Filled 2014-07-22: qty 1

## 2014-07-22 MED ORDER — SODIUM CHLORIDE 0.9 % IV SOLN
1000.0000 mL | Freq: Once | INTRAVENOUS | Status: AC
  Administered 2014-07-22: 1000 mL via INTRAVENOUS

## 2014-07-22 MED ORDER — FLUCONAZOLE 40 MG/ML PO SUSR: 100.0000 mg | Freq: Every day | ORAL | Status: AC

## 2014-07-22 MED ORDER — IOHEXOL 300 MG/ML  SOLN
50.0000 mL | Freq: Once | INTRAMUSCULAR | Status: AC | PRN
  Administered 2014-07-22: 50 mL via ORAL

## 2014-07-22 NOTE — ED Notes (Signed)
CT tech came to get pt, pt has not drank CT contrast, when asked why pt stated "it hurts too bad and I will not drink anymore until I get pain medication", EDP made aware, fentanyl to be given.

## 2014-07-22 NOTE — ED Provider Notes (Signed)
0700 - Care from Dr. Tomi Bamberger. 88F HIV+ patient here with epigastric pain, odynophagia. Has UTI, given Rocephin. Awaiting CT scan. Noncompliant with her meds. CT shows sludge in GB - LFTs ok. Epigastric pain, odynophagia likely candidal esophagitis - given loading dose of fluconazole. Discharged with Keflex for her UTI, fluconazole, pain meds - all liquid meds. Patient ok with this plan, stable for discharge.  1. Costochondritis   2. Reflux esophagitis   3. Acute cystitis without hematuria   4. Iron deficiency anemia      Evelina Bucy, MD 07/22/14 [phone]

## 2014-07-22 NOTE — ED Provider Notes (Signed)
CSN: [id number]     Arrival date & time 07/21/14  2237 History   First MD Initiated Contact with Patient 07/22/14 0140     Chief Complaint  Patient presents with  . Abdominal Pain     (Consider location/radiation/quality/duration/timing/severity/associated sxs/prior Treatment) HPI Patient reports she has had chest pain for the past week. The pain comes and goes and lasts about a minute. She states it's a burning and a tightness and is located in the center of her chest. She states any type of movement or coughing makes the pain worse. She states if she presses on her chest it feels better. She also started having abdominal pain 4 days ago. The pain is epigastric and she describes it as a pressure and a hunger type pain. That pain is constant. She states eating or drinking anything will immediately make the pain worse. She states pushing on her abdomen makes it feel better. She reports she's been having burning reflux fluid going into her throat for about a month. She has had a esophageal ulcer in the past. She has had some shortness of breath. She's had nausea with vomiting twice today. She denies any diarrhea, dysuria or frequency.  Pt states she has had esophageal ulcers in the past.   PCP Dr Jeanie Cooks  Past Medical History  Diagnosis Date  . HIV positive   . Hepatitis B     /E-chart  . Microcytic anemia     h/o per E-chart  . 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 Noncompliance with medication regimen     /e-chart  . 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 Pyelonephritis     h/o per E-chart  . Thyroid disease   . Depression    Past Surgical History  Procedure Laterality Date  . Thyroid surgery    . Thyroid lobectomy  08/2002    left & isthmectomy; for benign thyroid adenoma/E-chart  . Tubal ligation  07/2002    /E-chart  . Esophagogastroduodenoscopy N/A 07/13/2013    Procedure: ESOPHAGOGASTRODUODENOSCOPY  (EGD);  Surgeon: Beryle Beams, MD;  Location: [facility];  Service: Endoscopy;  Laterality: N/A;   Family History  Problem Relation Age of Onset  . Cancer Mother   . Diabetes Mother   . Diabetes Father   . Heart disease Father   . Cancer Sister   . Diabetes Sister   . Asthma Son    History  Substance Use Topics  . Smoking status: Never Smoker   . Smokeless tobacco: Never Used  . 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     Allergies  Bactrim; Dapsone; Orange fruit; Peanut-containing drug products; and Shellfish allergy  Home Medications   Prior to Admission medications   Medication Sig Start Date End Date Taking? Authorizing Provider  albuterol (PROVENTIL HFA;VENTOLIN HFA) 108 (90 BASE) MCG/ACT inhaler Inhale 2 puffs into the lungs every 6 (six) hours as needed for wheezing.   Yes Historical Provider, MD   BP 119/75  Pulse 85  Temp(Src) 98.5 F (36.9 C) (Oral)  Resp 20  SpO2 100%  LMP 05/21/2014  Vital signs normal   Physical Exam  Nursing note and vitals reviewed. Constitutional: She is oriented to person, place, and time. She appears well-developed and well-nourished.  Non-toxic appearance. She does not appear ill. No distress.  HENT:  Head: Normocephalic and atraumatic.  Right Ear: External ear normal.  Left Ear: External ear normal.  Nose: Nose normal. No mucosal edema or rhinorrhea.  Mouth/Throat: Oropharynx is clear and moist and mucous membranes are normal. No dental abscesses or uvula swelling.  Eyes: Conjunctivae and EOM are normal. Pupils are equal, round, and reactive to light.  Neck: Normal range of motion and full passive range of motion without pain. Neck supple.  Cardiovascular: Normal rate, regular rhythm and normal heart sounds.  Exam reveals no gallop and no friction  rub.   No murmur heard. Pulmonary/Chest: Effort normal and breath sounds normal. No respiratory distress. She has no wheezes. She has no rhonchi. She has no rales. She exhibits no tenderness and no crepitus.    Patient has tenderness of her lower costochondral junctions bilaterally  Abdominal: Soft. Normal appearance and bowel sounds are normal. She exhibits no distension. There is tenderness in the epigastric area. There is no rebound and no guarding.    Musculoskeletal: Normal range of motion. She exhibits no edema and no tenderness.  Moves all extremities well.   Neurological: She is alert and oriented to person, place, and time. She has normal strength. No cranial nerve deficit.  Skin: Skin is warm, dry and intact. No rash noted. No erythema. No pallor.  Psychiatric: She has a normal mood and affect. Her speech is normal and behavior is normal. Her mood appears not anxious.    ED Course  Procedures (including critical care time)  Medications  0.9 %  sodium chloride infusion (0 mLs Intravenous Stopped 07/22/14 0451)    Followed by  0.9 %  sodium chloride infusion (1,000 mLs Intravenous New Bag/Given 07/22/14 0451)  famotidine (PEPCID) IVPB 20 mg (0 mg Intravenous Stopped 07/22/14 0316)  ondansetron (ZOFRAN) injection 4 mg (4 mg Intravenous Given 07/22/14 0240)  cefTRIAXone (ROCEPHIN) 1 g in dextrose 5 % 50 mL IVPB (0 g Intravenous Stopped 07/22/14 0424)  fentaNYL (SUBLIMAZE) injection 25 mcg (25 mcg Intravenous Given 07/22/14 0240)  fentaNYL (SUBLIMAZE) injection 50 mcg (50 mcg Intravenous Given 07/22/14 0343)  gi cocktail (Maalox,Lidocaine,Donnatal) (30 mLs Oral Given 07/22/14 0503)  sucralfate ([name]) tablet 1 g (1 g Oral Given 07/22/14 0523)  iohexol (OMNIPAQUE) 300 MG/ML solution 50 mL (50 mLs Oral Contrast Given 07/22/14 0550)  fentaNYL (SUBLIMAZE) injection 25 mcg (25 mcg Intravenous Given 07/22/14 0701)     Patient has had a persistent anemia this year. She states when she has a period she  has very heavy menses with lots of clots. Her last period was 2 months ago. She is supposed to be taking iron pills but she isn't.   02:00 pt denies having flank pain or urinary symptoms. Will give rocephin for UTI.   05:00 Pt continues to c/o epigastric abdominal pain that is sharp. She denies flank pain. She states the GI cocktail and carafate stopped her chest pain.  She is agreeable to a CT scan. Pt is asking if she can drink water, have asked her to wait.   07:00 nurse reports pt hasn't drank her contrast, states she won't drink it without more pain medication.    07:05 pt turned over to Dr Mingo Amber at the change of shift to get her CT results.   Labs Review Results for orders placed during the hospital  encounter of 07/21/14  CBC WITH DIFFERENTIAL      Result Value Ref Range   WBC 5.6  4.0 - 10.5 K/uL   RBC 4.24  3.87 - 5.11 MIL/uL   Hemoglobin 8.3 (*) 12.0 - 15.0 g/dL   HCT 27.5 (*) 36.0 - 46.0 %   MCV 64.9 (*) 78.0 - 100.0 fL   MCH 19.6 (*) 26.0 - 34.0 pg   MCHC 30.2  30.0 - 36.0 g/dL   RDW 18.7 (*) 11.5 - 15.5 %   Platelets 319  150 - 400 K/uL   Neutrophils Relative % 76  43 - 77 %   Lymphocytes Relative 12  12 - 46 %   Monocytes Relative 10  3 - 12 %   Eosinophils Relative 2  0 - 5 %   Basophils Relative 0  0 - 1 %   Neutro Abs 4.2  1.7 - 7.7 K/uL   Lymphs Abs 0.7  0.7 - 4.0 K/uL   Monocytes Absolute 0.6  0.1 - 1.0 K/uL   Eosinophils Absolute 0.1  0.0 - 0.7 K/uL   Basophils Absolute 0.0  0.0 - 0.1 K/uL   Smear Review MORPHOLOGY UNREMARKABLE    COMPREHENSIVE METABOLIC PANEL      Result Value Ref Range   Sodium 141  137 - 147 mEq/L   Potassium 3.5 (*) 3.7 - 5.3 mEq/L   Chloride 101  96 - 112 mEq/L   CO2 24  19 - 32 mEq/L   Glucose, Bld 98  70 - 99 mg/dL   BUN 11  6 - 23 mg/dL   Creatinine, Ser 0.71  0.50 - 1.10 mg/dL   Calcium 9.5  8.4 - 10.5 mg/dL   Total Protein 8.4 (*) 6.0 - 8.3 g/dL   Albumin 3.7  3.5 - 5.2 g/dL   AST 30  0 - 37 U/L   ALT 9  0 - 35 U/L    Alkaline Phosphatase 88  39 - 117 U/L   Total Bilirubin 0.6  0.3 - 1.2 mg/dL   GFR calc non Af Amer >90  >90 mL/min   GFR calc Af Amer >90  >90 mL/min   Anion gap 16 (*) 5 - 15  LIPASE, BLOOD      Result Value Ref Range   Lipase 18  11 - 59 U/L  PREGNANCY, URINE      Result Value Ref Range   Preg Test, Ur NEGATIVE  NEGATIVE  URINALYSIS, ROUTINE W REFLEX MICROSCOPIC      Result Value Ref Range   Color, Urine YELLOW  YELLOW   APPearance CLOUDY (*) CLEAR   Specific Gravity, Urine 1.016  1.005 - 1.030   pH 6.0  5.0 - 8.0   Glucose, UA NEGATIVE  NEGATIVE mg/dL   Hgb urine dipstick LARGE (*) NEGATIVE   Bilirubin Urine NEGATIVE  NEGATIVE   Ketones, ur >80 (*) NEGATIVE mg/dL   Protein, ur 100 (*) NEGATIVE mg/dL   Urobilinogen, UA 1.0  0.0 - 1.0 mg/dL   Nitrite POSITIVE (*) NEGATIVE   Leukocytes, UA LARGE (*) NEGATIVE  URINE MICROSCOPIC-ADD ON      Result Value Ref Range   Squamous Epithelial / LPF FEW (*) RARE   WBC, UA TOO NUMEROUS TO COUNT  <3 WBC/hpf   RBC / HPF TOO NUMEROUS TO COUNT  <3 RBC/hpf   Bacteria, UA MANY (*) RARE  POC URINE PREG, ED      Result Value Ref Range   Preg Test,  Ur NEGATIVE  NEGATIVE   Laboratory interpretation all normal except UTI, stable anemia     Imaging Review No results found.   EKG Interpretation None      MDM   Final diagnoses:  Costochondritis  Reflux esophagitis  Acute cystitis without hematuria  Iron deficiency anemia    Disposition pending  Rolland Porter, MD, Abram Sander     Janice Norrie, MD 07/22/14 [phone]

## 2014-07-22 NOTE — ED Notes (Signed)
Pt awake and states the upper epigastric pain in to her chest has returned and is a 10/10-Dr. Tomi Bamberger made aware and GI cocktail administered as ordered

## 2014-07-22 NOTE — ED Notes (Signed)
Pt states upper abdominal pain into her chest has returned and is a 9/10-Dr. Eliane Decree made aware and Fentanyl 50 mcg administered IV as ordered.

## 2014-07-22 NOTE — ED Notes (Signed)
Pt c/o abd pain / burning that radiates up to her chest since Monday; pt states that eating makes it worse; pt states that she has not eaten in 4 days due to pain; pt states that she feels bloated; pt c/o diarrhea on Tues - none today

## 2014-07-22 NOTE — Discharge Instructions (Signed)
Esophagitis Esophagitis is inflammation of the esophagus. It can involve swelling, soreness, and pain in the esophagus. This condition can make it difficult and painful to swallow. CAUSES  Most causes of esophagitis are not serious. Many different factors can cause esophagitis, including:  Gastroesophageal reflux disease (GERD). This is when acid from your stomach flows up into the esophagus.  Recurrent vomiting.  An allergic-type reaction.  Certain medicines, especially those that come in large pills.  Ingestion of harmful chemicals, such as household cleaning products.  Heavy alcohol use.  An infection of the esophagus.  Radiation treatment for cancer.  Certain diseases such as sarcoidosis, Crohn's disease, and scleroderma. These diseases may cause recurrent esophagitis. SYMPTOMS   Trouble swallowing.  Painful swallowing.  Chest pain.  Difficulty breathing.  Nausea.  Vomiting.  Abdominal pain. DIAGNOSIS  Your caregiver will take your history and do a physical exam. Depending upon what your caregiver finds, certain tests may also be done, including:  Barium X-ray. You will drink a solution that coats the esophagus, and X-rays will be taken.  Endoscopy. A lighted tube is put down the esophagus so your caregiver can examine the area.  Allergy tests. These can sometimes be arranged through follow-up visits. TREATMENT  Treatment will depend on the cause of your esophagitis. In some cases, steroids or other medicines may be given to help relieve your symptoms or to treat the underlying cause of your condition. Medicines that may be recommended include:  Viscous lidocaine, to soothe the esophagus.  Antacids.  Acid reducers.  Proton pump inhibitors.  Antiviral medicines for certain viral infections of the esophagus.  Antifungal medicines for certain fungal infections of the esophagus.  Antibiotic medicines, depending on the cause of the esophagitis. HOME CARE  INSTRUCTIONS   Avoid foods and drinks that seem to make your symptoms worse.  Eat small, frequent meals instead of large meals.  Avoid eating for the 3 hours prior to your bedtime.  If you have trouble taking pills, use a pill splitter to decrease the size and likelihood of the pill getting stuck or injuring the esophagus on the way down. Drinking water after taking a pill also helps.  Stop smoking if you smoke.  Maintain a healthy weight.  Wear loose-fitting clothing. Do not wear anything tight around your waist that causes pressure on your stomach.  Raise the head of your bed 6 to 8 inches with wood blocks to help you sleep. Extra pillows will not help.  Only take over-the-counter or prescription medicines as directed by your caregiver. SEEK IMMEDIATE MEDICAL CARE IF:  You have severe chest pain that radiates into your arm, neck, or jaw.  You feel sweaty, dizzy, or lightheaded.  You have shortness of breath.  You vomit blood.  You have difficulty or pain with swallowing.  You have bloody or black, tarry stools.  You have a fever.  You have a burning sensation in the chest more than 3 times a week for more than 2 weeks.  You cannot swallow, drink, or eat.  You drool because you cannot swallow your saliva. MAKE SURE YOU:  Understand these instructions.  Will watch your condition.  Will get help right away if you are not doing well or get worse. Document Released: 01/09/2005 Document Revised: 02/24/2012 Document Reviewed: 08/02/2011 [facility] Patient Information 2015 [employer], [place]. This information is not intended to replace advice given to you by your health care provider. Make sure you discuss any questions you have with your health care provider.  Urinary Tract Infection Urinary tract infections (UTIs) can develop anywhere along your urinary tract. Your urinary tract is your body's drainage system for removing wastes and extra water. Your urinary tract includes  two kidneys, two ureters, a bladder, and a urethra. Your kidneys are a pair of bean-shaped organs. Each kidney is about the size of your fist. They are located below your ribs, one on each side of your spine. CAUSES Infections are caused by microbes, which are microscopic organisms, including fungi, viruses, and bacteria. These organisms are so small that they can only be seen through a microscope. Bacteria are the microbes that most commonly cause UTIs. SYMPTOMS  Symptoms of UTIs may vary by age and gender of the patient and by the location of the infection. Symptoms in young women typically include a frequent and intense urge to urinate and a painful, burning feeling in the bladder or urethra during urination. Older women and men are more likely to be tired, shaky, and weak and have muscle aches and abdominal pain. A fever may mean the infection is in your kidneys. Other symptoms of a kidney infection include pain in your back or sides below the ribs, nausea, and vomiting. DIAGNOSIS To diagnose a UTI, your caregiver will ask you about your symptoms. Your caregiver also will ask to provide a urine sample. The urine sample will be tested for bacteria and white blood cells. White blood cells are made by your body to help fight infection. TREATMENT  Typically, UTIs can be treated with medication. Because most UTIs are caused by a bacterial infection, they usually can be treated with the use of antibiotics. The choice of antibiotic and length of treatment depend on your symptoms and the type of bacteria causing your infection. HOME CARE INSTRUCTIONS  If you were prescribed antibiotics, take them exactly as your caregiver instructs you. Finish the medication even if you feel better after you have only taken some of the medication.  Drink enough water and fluids to keep your urine clear or pale yellow.  Avoid caffeine, tea, and carbonated beverages. They tend to irritate your bladder.  Empty your bladder  often. Avoid holding urine for long periods of time.  Empty your bladder before and after sexual intercourse.  After a bowel movement, women should cleanse from front to back. Use each tissue only once. SEEK MEDICAL CARE IF:   You have back pain.  You develop a fever.  Your symptoms do not begin to resolve within 3 days. SEEK IMMEDIATE MEDICAL CARE IF:   You have severe back pain or lower abdominal pain.  You develop chills.  You have nausea or vomiting.  You have continued burning or discomfort with urination. MAKE SURE YOU:   Understand these instructions.  Will watch your condition.  Will get help right away if you are not doing well or get worse. Document Released: 09/11/2005 Document Revised: 06/02/2012 Document Reviewed: 01/10/2012 [facility] Patient Information 2015 [place], [place]. This information is not intended to replace advice given to you by your health care provider. Make sure you discuss any questions you have with your health care provider.

## 2014-07-24 LAB — URINE CULTURE

## 2014-07-25 ENCOUNTER — Telehealth (HOSPITAL_COMMUNITY): Payer: Self-pay

## 2014-07-25 NOTE — ED Notes (Signed)
Post ED Visit - Positive Culture Follow-up  Culture report reviewed by antimicrobial stewardship pharmacist: []  Wes [place], Pharm.D., BCPS []  Heide Guile, Pharm.D., BCPS []  Alycia Rossetti, Pharm.D., BCPS []  [place], Pharm.D., BCPS, AAHIVP []  Legrand Como, Pharm.D., BCPS, AAHIVP []  Hassie Bruce, Pharm.D. [x]  Milus Glazier, [place].D.  Positive urine culture Treated with cephalexin,  organism sensitive to the same and no further patient follow-up is required at this time.  Ileene Musa 07/25/2014, 1:55 PM

## 2014-08-10 ENCOUNTER — Encounter (HOSPITAL_COMMUNITY): Payer: Self-pay | Admitting: Emergency Medicine

## 2014-08-10 ENCOUNTER — Emergency Department (HOSPITAL_COMMUNITY): Payer: Medicaid Other

## 2014-08-10 ENCOUNTER — Inpatient Hospital Stay (HOSPITAL_COMMUNITY)
Admission: EM | Admit: 2014-08-10 | Discharge: 2014-08-20 | DRG: 974 | Disposition: A | Discharge status: Home / Self Care | Payer: Medicaid Other | Attending: Internal Medicine | Admitting: Internal Medicine

## 2014-08-10 DIAGNOSIS — Z91199 Patient's noncompliance with other medical treatment and regimen due to unspecified reason: Secondary | ICD-10-CM | POA: Diagnosis not present

## 2014-08-10 DIAGNOSIS — R7402 Elevation of levels of lactic acid dehydrogenase (LDH): Secondary | ICD-10-CM | POA: Diagnosis present

## 2014-08-10 DIAGNOSIS — R740 Nonspecific elevation of levels of transaminase and lactic acid dehydrogenase [LDH]: Secondary | ICD-10-CM

## 2014-08-10 DIAGNOSIS — F314 Bipolar disorder, current episode depressed, severe, without psychotic features: Secondary | ICD-10-CM

## 2014-08-10 DIAGNOSIS — Z9119 Patient's noncompliance with other medical treatment and regimen: Secondary | ICD-10-CM

## 2014-08-10 DIAGNOSIS — B20 Human immunodeficiency virus [HIV] disease: Secondary | ICD-10-CM | POA: Diagnosis not present

## 2014-08-10 DIAGNOSIS — D500 Iron deficiency anemia secondary to blood loss (chronic): Secondary | ICD-10-CM | POA: Diagnosis present

## 2014-08-10 DIAGNOSIS — R634 Abnormal weight loss: Secondary | ICD-10-CM

## 2014-08-10 DIAGNOSIS — B1910 Unspecified viral hepatitis B without hepatic coma: Secondary | ICD-10-CM | POA: Diagnosis present

## 2014-08-10 DIAGNOSIS — Z833 Family history of diabetes mellitus: Secondary | ICD-10-CM

## 2014-08-10 DIAGNOSIS — E43 Unspecified severe protein-calorie malnutrition: Secondary | ICD-10-CM | POA: Diagnosis present

## 2014-08-10 DIAGNOSIS — D509 Iron deficiency anemia, unspecified: Secondary | ICD-10-CM

## 2014-08-10 DIAGNOSIS — J45909 Unspecified asthma, uncomplicated: Secondary | ICD-10-CM | POA: Diagnosis present

## 2014-08-10 DIAGNOSIS — R1310 Dysphagia, unspecified: Secondary | ICD-10-CM | POA: Diagnosis present

## 2014-08-10 DIAGNOSIS — R1011 Right upper quadrant pain: Secondary | ICD-10-CM | POA: Diagnosis present

## 2014-08-10 DIAGNOSIS — F3289 Other specified depressive episodes: Secondary | ICD-10-CM | POA: Diagnosis present

## 2014-08-10 DIAGNOSIS — B180 Chronic viral hepatitis B with delta-agent: Secondary | ICD-10-CM

## 2014-08-10 DIAGNOSIS — Z79899 Other long term (current) drug therapy: Secondary | ICD-10-CM

## 2014-08-10 DIAGNOSIS — F3131 Bipolar disorder, current episode depressed, mild: Secondary | ICD-10-CM

## 2014-08-10 DIAGNOSIS — K2210 Ulcer of esophagus without bleeding: Secondary | ICD-10-CM | POA: Diagnosis not present

## 2014-08-10 DIAGNOSIS — D62 Acute posthemorrhagic anemia: Secondary | ICD-10-CM | POA: Diagnosis present

## 2014-08-10 DIAGNOSIS — K5900 Constipation, unspecified: Secondary | ICD-10-CM | POA: Diagnosis present

## 2014-08-10 DIAGNOSIS — E876 Hypokalemia: Secondary | ICD-10-CM | POA: Diagnosis not present

## 2014-08-10 DIAGNOSIS — B370 Candidal stomatitis: Secondary | ICD-10-CM | POA: Diagnosis present

## 2014-08-10 DIAGNOSIS — IMO0002 Reserved for concepts with insufficient information to code with codable children: Secondary | ICD-10-CM

## 2014-08-10 DIAGNOSIS — F329 Major depressive disorder, single episode, unspecified: Secondary | ICD-10-CM | POA: Diagnosis present

## 2014-08-10 DIAGNOSIS — R109 Unspecified abdominal pain: Secondary | ICD-10-CM | POA: Diagnosis not present

## 2014-08-10 DIAGNOSIS — K219 Gastro-esophageal reflux disease without esophagitis: Secondary | ICD-10-CM | POA: Diagnosis present

## 2014-08-10 DIAGNOSIS — R7401 Elevation of levels of liver transaminase levels: Secondary | ICD-10-CM | POA: Diagnosis present

## 2014-08-10 DIAGNOSIS — F319 Bipolar disorder, unspecified: Secondary | ICD-10-CM | POA: Diagnosis not present

## 2014-08-10 DIAGNOSIS — N870 Mild cervical dysplasia: Secondary | ICD-10-CM

## 2014-08-10 DIAGNOSIS — D34 Benign neoplasm of thyroid gland: Secondary | ICD-10-CM

## 2014-08-10 DIAGNOSIS — R748 Abnormal levels of other serum enzymes: Secondary | ICD-10-CM

## 2014-08-10 DIAGNOSIS — R52 Pain, unspecified: Secondary | ICD-10-CM | POA: Diagnosis not present

## 2014-08-10 DIAGNOSIS — F3175 Bipolar disorder, in partial remission, most recent episode depressed: Secondary | ICD-10-CM

## 2014-08-10 DIAGNOSIS — A6000 Herpesviral infection of urogenital system, unspecified: Secondary | ICD-10-CM

## 2014-08-10 LAB — URINALYSIS, ROUTINE W REFLEX MICROSCOPIC
BILIRUBIN URINE: NEGATIVE
Bilirubin Urine: NEGATIVE
GLUCOSE, UA: NEGATIVE mg/dL
Glucose, UA: NEGATIVE mg/dL
Hgb urine dipstick: NEGATIVE
Hgb urine dipstick: NEGATIVE
KETONES UR: 15 mg/dL — AB
Ketones, ur: 15 mg/dL — AB
LEUKOCYTES UA: NEGATIVE
Leukocytes, UA: NEGATIVE
Nitrite: NEGATIVE
Nitrite: NEGATIVE
Protein, ur: NEGATIVE mg/dL
Protein, ur: NEGATIVE mg/dL
Specific Gravity, Urine: 1.017 (ref 1.005–1.030)
Specific Gravity, Urine: >1.046 — ABNORMAL HIGH (ref 1.005–1.030)
UROBILINOGEN UA: 1 mg/dL (ref 0.0–1.0)
Urobilinogen, UA: 1 mg/dL (ref 0.0–1.0)
pH: 6.5 (ref 5.0–8.0)
pH: 6.5 (ref 5.0–8.0)

## 2014-08-10 LAB — CBC WITH DIFFERENTIAL/PLATELET
BASOS PCT: 0 % (ref 0–1)
Basophils Absolute: 0 10*3/uL (ref 0.0–0.1)
EOS PCT: 3 % (ref 0–5)
Eosinophils Absolute: 0.2 10*3/uL (ref 0.0–0.7)
HCT: 11.7 % — ABNORMAL LOW (ref 36.0–46.0)
Hemoglobin: 3.6 g/dL — CL (ref 12.0–15.0)
LYMPHS ABS: 0.6 10*3/uL — AB (ref 0.7–4.0)
Lymphocytes Relative: 12 % (ref 12–46)
MCH: 19.4 pg — AB (ref 26.0–34.0)
MCHC: 30.8 g/dL (ref 30.0–36.0)
MCV: 62.9 fL — AB (ref 78.0–100.0)
Monocytes Absolute: 0.4 10*3/uL (ref 0.1–1.0)
Monocytes Relative: 8 % (ref 3–12)
Neutro Abs: 3.9 10*3/uL (ref 1.7–7.7)
Neutrophils Relative %: 77 % (ref 43–77)
PLATELETS: 325 10*3/uL (ref 150–400)
RBC: 1.86 MIL/uL — AB (ref 3.87–5.11)
RDW: 18.6 % — ABNORMAL HIGH (ref 11.5–15.5)
WBC: 5.1 10*3/uL (ref 4.0–10.5)

## 2014-08-10 LAB — COMPREHENSIVE METABOLIC PANEL
ALT: 32 U/L (ref 0–35)
ANION GAP: 16 — AB (ref 5–15)
AST: 52 U/L — ABNORMAL HIGH (ref 0–37)
Albumin: 3.4 g/dL — ABNORMAL LOW (ref 3.5–5.2)
Alkaline Phosphatase: 86 U/L (ref 39–117)
BUN: 6 mg/dL (ref 6–23)
CO2: 22 meq/L (ref 19–32)
CREATININE: 0.61 mg/dL (ref 0.50–1.10)
Calcium: 9.4 mg/dL (ref 8.4–10.5)
Chloride: 97 mEq/L (ref 96–112)
GFR calc Af Amer: >90 mL/min (ref >90)
GLUCOSE: 96 mg/dL (ref 70–99)
Potassium: 3.2 mEq/L — ABNORMAL LOW (ref 3.7–5.3)
Sodium: 135 mEq/L — ABNORMAL LOW (ref 137–147)
TOTAL PROTEIN: 7.5 g/dL (ref 6.0–8.3)
Total Bilirubin: 0.4 mg/dL (ref 0.3–1.2)

## 2014-08-10 LAB — RAPID URINE DRUG SCREEN, HOSP PERFORMED
Amphetamines: NOT DETECTED
BENZODIAZEPINES: NOT DETECTED
Barbiturates: NOT DETECTED
Cocaine: NOT DETECTED
Opiates: POSITIVE — AB
Tetrahydrocannabinol: NOT DETECTED

## 2014-08-10 LAB — POC OCCULT BLOOD, ED: Fecal Occult Bld: NEGATIVE

## 2014-08-10 LAB — PREPARE RBC (CROSSMATCH)

## 2014-08-10 LAB — LIPASE, BLOOD: LIPASE: 62 U/L — AB (ref 11–59)

## 2014-08-10 LAB — POC URINE PREG, ED: Preg Test, Ur: NEGATIVE

## 2014-08-10 MED ORDER — MORPHINE SULFATE 4 MG/ML IJ SOLN
4.0000 mg | Freq: Once | INTRAMUSCULAR | Status: AC
  Administered 2014-08-10: 4 mg via INTRAVENOUS
  Filled 2014-08-10: qty 1

## 2014-08-10 MED ORDER — ONDANSETRON HCL 4 MG/2ML IJ SOLN
4.0000 mg | Freq: Four times a day (QID) | INTRAMUSCULAR | Status: DC | PRN
  Administered 2014-08-10 – 2014-08-17 (×11): 4 mg via INTRAVENOUS
  Filled 2014-08-10 (×11): qty 2

## 2014-08-10 MED ORDER — ENOXAPARIN SODIUM 40 MG/0.4ML ~~LOC~~ SOLN
40.0000 mg | SUBCUTANEOUS | Status: DC
  Administered 2014-08-13: 40 mg via SUBCUTANEOUS
  Filled 2014-08-10 (×11): qty 0.4

## 2014-08-10 MED ORDER — SODIUM CHLORIDE 0.9 % IV SOLN
Freq: Once | INTRAVENOUS | Status: AC
  Administered 2014-08-10: 10:00:00 via INTRAVENOUS

## 2014-08-10 MED ORDER — CETYLPYRIDINIUM CHLORIDE 0.05 % MT LIQD
7.0000 mL | Freq: Two times a day (BID) | OROMUCOSAL | Status: DC
  Administered 2014-08-11 – 2014-08-18 (×4): 7 mL via OROMUCOSAL

## 2014-08-10 MED ORDER — IOHEXOL 300 MG/ML  SOLN
100.0000 mL | Freq: Once | INTRAMUSCULAR | Status: AC | PRN
  Administered 2014-08-10: 100 mL via INTRAVENOUS

## 2014-08-10 MED ORDER — CHLORHEXIDINE GLUCONATE 0.12 % MT SOLN
15.0000 mL | Freq: Two times a day (BID) | OROMUCOSAL | Status: DC
  Administered 2014-08-10 – 2014-08-16 (×6): 15 mL via OROMUCOSAL
  Filled 2014-08-10 (×22): qty 15

## 2014-08-10 MED ORDER — ONDANSETRON HCL 4 MG/2ML IJ SOLN
4.0000 mg | Freq: Once | INTRAMUSCULAR | Status: AC
  Administered 2014-08-10: 4 mg via INTRAVENOUS
  Filled 2014-08-10: qty 2

## 2014-08-10 MED ORDER — SODIUM CHLORIDE 0.9 % IV SOLN
INTRAVENOUS | Status: DC
  Administered 2014-08-10: 09:00:00 via INTRAVENOUS

## 2014-08-10 MED ORDER — OXYCODONE HCL 5 MG PO TABS
5.0000 mg | ORAL_TABLET | ORAL | Status: DC | PRN
  Administered 2014-08-15 – 2014-08-20 (×3): 5 mg via ORAL
  Filled 2014-08-10 (×3): qty 1

## 2014-08-10 MED ORDER — SODIUM CHLORIDE 0.9 % IV SOLN
INTRAVENOUS | Status: DC
  Administered 2014-08-10 – 2014-08-14 (×6): via INTRAVENOUS

## 2014-08-10 MED ORDER — ONDANSETRON HCL 4 MG PO TABS: 4.0000 mg | ORAL_TABLET | Freq: Four times a day (QID) | ORAL | Status: DC | PRN

## 2014-08-10 MED ORDER — SODIUM CHLORIDE 0.9 % IV BOLUS (SEPSIS)
1000.0000 mL | Freq: Once | INTRAVENOUS | Status: AC
  Administered 2014-08-10: 1000 mL via INTRAVENOUS

## 2014-08-10 MED ORDER — HYDROMORPHONE HCL PF 1 MG/ML IJ SOLN
1.0000 mg | INTRAMUSCULAR | Status: DC | PRN
  Administered 2014-08-10 – 2014-08-20 (×60): 1 mg via INTRAVENOUS
  Filled 2014-08-10 (×61): qty 1

## 2014-08-10 NOTE — Progress Notes (Signed)
[facility] Community Liaison Stacy,  ° °Provided pt with a list of primary care resources and a [facility] [place] Card application to help patient establish primary care.  °

## 2014-08-10 NOTE — ED Provider Notes (Signed)
CSN: [id number]     Arrival date & time 08/10/14  0708 History   First MD Initiated Contact with Patient 08/10/14 0720     Chief Complaint  Patient presents with  . Ribcage pain   . Upper abdominal pain   . Headache     (Consider location/radiation/quality/duration/timing/severity/associated sxs/prior Treatment) HPI Comments: Patient here complaining of worsening quadrant pain x1 month. Recently treated for candidal esophagitis. Symptoms are worse after she eats. She's had nausea no vomiting. No vaginal bleeding or discharge. No urinary symptoms. Denies any black or bloody stools. Symptoms persisted and relieved after she takes hydrocodone. Denies any syncope or near-syncope. Has not called her doctor.  Patient is a 38 y.o. female presenting with headaches. The history is provided by the patient.  Headache   Past Medical History  Diagnosis Date  . HIV positive   . Hepatitis B     /E-chart  . Microcytic anemia     h/o per E-chart  . 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 Noncompliance with medication regimen     /e-chart  . 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 Pyelonephritis     h/o per E-chart  . Thyroid disease   . Depression   . Asthma     inhaler 2xday   Past Surgical History  Procedure Laterality Date  . Thyroid surgery    . Thyroid lobectomy  08/2002    left & isthmectomy; for benign thyroid adenoma/E-chart  . Tubal ligation  07/2002    /E-chart  . Esophagogastroduodenoscopy N/A 07/13/2013    Procedure: ESOPHAGOGASTRODUODENOSCOPY (EGD);  Surgeon: Beryle Beams, MD;  Location: [facility];  Service: Endoscopy;  Laterality: N/A;   Family History  Problem Relation Age of Onset  . Cancer Mother   . Diabetes Mother   . Diabetes Father   . Heart disease Father   . Cancer Sister   . Diabetes Sister   . Asthma Son    History  Substance Use Topics  . Smoking status: Never Smoker   .  Smokeless tobacco: Never Used  . Alcohol Use: Yes     Comment: occ   OB History   Grav Para Term Preterm Abortions TAB SAB Ect Mult Living                 Review of Systems  Neurological: Positive for headaches.  All other systems reviewed and are negative.     Allergies  Bactrim; Dapsone; Orange fruit; Peanut-containing drug products; and Shellfish allergy  Home Medications   Prior to Admission medications   Medication Sig Start Date End Date Taking? Authorizing Provider  acetaminophen (TYLENOL) 500 MG tablet Take 500 mg by mouth every 6 (six) hours as needed for mild pain or headache.   Yes Historical Provider, MD  albuterol (PROVENTIL HFA;VENTOLIN HFA) 108 (90 BASE) MCG/ACT inhaler Inhale 2 puffs into the lungs every 6 (six) hours as needed for wheezing.   Yes Historical Provider, MD  HYDROcodone-acetaminophen (NORCO) 7.5-325 MG per tablet Take 1 tablet by mouth every 6 (six) hours as needed for moderate pain.   Yes Historical Provider, MD   BP 119/82  Pulse 93  Temp(Src) 98.6 F (37 C) (Oral)  Resp 18  SpO2 100%  LMP 05/21/2014 Physical Exam  Nursing note and vitals reviewed. Constitutional: She is oriented to person, place, and time. She appears well-developed and well-nourished.  Non-toxic appearance. No distress.  HENT:  Head: Normocephalic and atraumatic.  Eyes: Conjunctivae, EOM and lids are normal. Pupils are equal, round, and reactive to light.  Neck: Normal range of motion. Neck supple. No tracheal deviation present. No mass present.  Cardiovascular: Normal rate, regular rhythm and normal heart sounds.  Exam reveals no gallop.   No murmur heard. Pulmonary/Chest: Effort normal and breath sounds normal. No stridor. No respiratory distress. She has no decreased breath sounds. She has no wheezes. She has no rhonchi. She has no rales.  Abdominal: Soft. Normal appearance and bowel sounds are normal. She exhibits no distension. There is tenderness in the right upper  quadrant. There is no rigidity, no rebound, no guarding and no CVA tenderness.    Musculoskeletal: Normal range of motion. She exhibits no edema and no tenderness.  Neurological: She is alert and oriented to person, place, and time. She has normal strength. No cranial nerve deficit or sensory deficit. GCS eye subscore is 4. GCS verbal subscore is 5. GCS motor subscore is 6.  Skin: Skin is warm and dry. No abrasion and no rash noted.  Psychiatric: She has a normal mood and affect. Her speech is normal and behavior is normal.    ED Course  Procedures (including critical care time) Labs Review Labs Reviewed  CBC WITH DIFFERENTIAL  COMPREHENSIVE METABOLIC PANEL  LIPASE, BLOOD  URINALYSIS, ROUTINE W REFLEX MICROSCOPIC  POC URINE PREG, ED    Imaging Review No results found.   EKG Interpretation None      MDM   Final diagnoses:  None    Patient's stool is Hemoccult negative. Patient has been ordered a blood transfusion and will be admitted to the hospitalist service.    Leota Jacobsen, MD 08/10/14 [phone]

## 2014-08-10 NOTE — ED Notes (Signed)
[place] at bedside, unable to collect blood at this time.

## 2014-08-10 NOTE — ED Notes (Signed)
US at bedside

## 2014-08-10 NOTE — ED Notes (Signed)
Initial Contact - pt A+Ox4, resting on stretcher with eyes closed.  Pt reports pain improved after previous medication.  Reports 5/10 RUQ abd pain at this time.  Pt also c/o mild R sided HA.  Pt denies other complaints.  Neuros grossly intact.  Speaking full/clear sentences.  Skin PWD.  RBC inf per order without issue.  Will CTM.  NAD.

## 2014-08-10 NOTE — ED Notes (Addendum)
Pt c/o nausea x 3 weeks, R side rib cage pain and abdominal pain x 1 week and headache x 2 days.  Pain score 10/10.  Pt reports that pain increases w/ eating and drinking.  Pt reports having pain previously and was told it was sludge in her gallbladder.

## 2014-08-10 NOTE — ED Notes (Signed)
PA student at bedside, conducting interview.

## 2014-08-10 NOTE — ED Notes (Signed)
Pt. unable to urinate at this time. Will collect. Nurse was notified.

## 2014-08-10 NOTE — ED Notes (Signed)
Pt denies dizziness, lightheadedness. Pt attempting to provide urine sample.

## 2014-08-10 NOTE — ED Notes (Signed)
hospitalist at bedside

## 2014-08-10 NOTE — Plan of Care (Signed)
Problem: Consults Goal: Nutrition Consult-if indicated Outcome: Progressing Dietician consult placed.

## 2014-08-10 NOTE — H&P (Addendum)
[facility] History and Physical  [name] NGE:[id number] DOB: 1976-01-08 DOA: 08/10/2014  Referring physician: ED physician PCP: Philis Fendt, MD   Chief Complaint: abdominal pain   HPI:  Pt is 38 yo female with HIV, Hep B, who presented to [facility] ED with main concern of one month duration of progressively worsening RUQ abdominal pain, throbbing and intermitted, 7/10 in severity when present, non radiating, with no specific urinary concerns, no fevers, chills, no sick contacts or exposures. Pt denies any blood in urine or stool. She has not seen her PCP for the problem.  In ED, pt noted to be hemodynamically stable but in mild distress due to pain. [facility] asked to admit for further evaluation.   Assessment and Plan: Active Problems: Abdominal pain  - etiology is not clear  - Ct abd with no specific acute findings to explain the etiology  - will admit to medical unit - provide supportive care with IVF, analgesia, antiemetics as needed  Elevated  Lipase - unclear if true pancreatitis - pt is not taking any ART medications - will repeat in AM Hypokalemia - supplement and repeat level in AM Transaminitis  - possibly from Hep B - will repeat CMET In AM HIV - last CD4# 10 in January 2015 - will repeat CD4# - may need ID consult or set up with ID specialist in an outpatient setting  Acute on chronic blood loss anemia - transfuse two unit of blood  - repeat CBC in AM   Radiological Exams on Admission:  US Abdomen Complete  08/10/2014  Gallbladder sludge. No associated sonographic findings to suggest acute cholecystitis.  Otherwise negative abdominal ultrasound.     Ct Abdomen Pelvis W Contrast  08/10/2014  No interval change or acute abnormality.     Code Status: Full Family Communication: Pt at bedside Disposition Plan: Admit for further evaluation     Review of Systems:  Constitutional: Negative for fever, chills and malaise/fatigue. Negative for diaphoresis.   HENT: Negative for hearing loss, ear pain, nosebleeds, congestion, sore throat, neck pain, tinnitus and ear discharge.   Eyes: Negative for blurred vision, double vision, photophobia, pain, discharge and redness.  Respiratory: Negative for cough, hemoptysis, sputum production, shortness of breath, wheezing and stridor.   Cardiovascular: Negative for chest pain, palpitations, orthopnea, claudication and leg swelling.  Gastrointestinal: Negative for nausea, vomiting Genitourinary: Negative for dysuria, urgency, frequency, hematuria and flank pain.  Musculoskeletal: Negative for myalgias, back pain, joint pain and falls.  Skin: Negative for itching and rash.  Neurological: Negative for dizziness and weakness. Negative for tingling, tremors, sensory change, speech change, focal weakness, loss of consciousness and headaches.  Endo/Heme/Allergies: Negative for environmental allergies and polydipsia. Does not bruise/bleed easily.  Psychiatric/Behavioral: Negative for suicidal ideas. The patient is not nervous/anxious.      Past Medical History  Diagnosis Date  . HIV positive   . Hepatitis B     /E-chart  . Microcytic anemia     h/o per E-chart  . 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 Noncompliance with medication regimen     /e-chart  . 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 Pyelonephritis     h/o per E-chart  . Thyroid disease   . Depression   . Asthma     inhaler 2xday    Past Surgical History  Procedure Laterality Date  . Thyroid surgery    . Thyroid  lobectomy  08/2002    left & isthmectomy; for benign thyroid adenoma/E-chart  . Tubal ligation  07/2002    /E-chart  . Esophagogastroduodenoscopy N/A 07/13/2013    Procedure: ESOPHAGOGASTRODUODENOSCOPY (EGD);  Surgeon: Beryle Beams, MD;  Location: [facility];  Service: Endoscopy;  Laterality: N/A;    Social History:  reports that she has never smoked.  She has never used smokeless tobacco. She reports that she drinks alcohol. She reports that she does not use illicit drugs.  Allergies  Allergen Reactions  . Bactrim Itching  . Dapsone Itching  . Orange Fruit Itching  . Peanut-Containing Drug Products Hives  . Shellfish Allergy Hives    Family History  Problem Relation Age of Onset  . Cancer Mother   . Diabetes Mother   . Diabetes Father   . Heart disease Father   . Cancer Sister   . Diabetes Sister   . Asthma Son     Prior to Admission medications   Medication Sig Start Date End Date Taking? Authorizing Provider  acetaminophen (TYLENOL) 500 MG tablet Take 500 mg by mouth every 6 (six) hours as needed for mild pain or headache.   Yes Historical Provider, MD  albuterol (PROVENTIL HFA;VENTOLIN HFA) 108 (90 BASE) MCG/ACT inhaler Inhale 2 puffs into the lungs every 6 (six) hours as needed for wheezing.   Yes Historical Provider, MD  HYDROcodone-acetaminophen (NORCO) 7.5-325 MG per tablet Take 1 tablet by mouth every 6 (six) hours as needed for moderate pain.   Yes Historical Provider, MD    Physical Exam: Filed Vitals:   08/10/14 1009 08/10/14 1012 08/10/14 1101 08/10/14 1115  BP: 112/66  127/80 131/73  Pulse: 73 64 71 66  Temp:   98.4 F (36.9 C) 98.4 F (36.9 C)  TempSrc:   Oral Oral  Resp: 20 11 17 11   SpO2: 100% 100% 100% 100%    Physical Exam  Constitutional: Appears well-developed and well-nourished. No distress.  HENT: Normocephalic. External right and left ear normal. Oropharynx is clear and moist.  Eyes: Conjunctivae and EOM are normal. PERRLA, no scleral icterus.  Neck: Normal ROM. Neck supple. No JVD. No tracheal deviation. No thyromegaly.  CVS: RRR, S1/S2 +, no murmurs, no gallops, no carotid bruit.  Pulmonary: Effort and breath sounds normal, no stridor, rhonchi, wheezes, rales.  Abdominal: Soft. BS +,  no distension, tenderness in upper abd quadrants, no rebound or guarding.  Musculoskeletal: Normal range  of motion. No edema and no tenderness.  Lymphadenopathy: No lymphadenopathy noted, cervical, inguinal. Neuro: Alert. Normal reflexes, muscle tone coordination. No cranial nerve deficit. Skin: Skin is warm and dry. No rash noted. Not diaphoretic. No erythema. No pallor.  Psychiatric: Normal mood and affect. Behavior, judgment, thought content normal.   Labs on Admission:  Basic Metabolic Panel:  Recent Labs Lab 08/10/14 0849  NA 135*  K 3.2*  CL 97  CO2 22  GLUCOSE 96  BUN 6  CREATININE 0.61  CALCIUM 9.4   Liver Function Tests:  Recent Labs Lab 08/10/14 0849  AST 52*  ALT 32  ALKPHOS 86  BILITOT 0.4  PROT 7.5  ALBUMIN 3.4*    Recent Labs Lab 08/10/14 0849  LIPASE 62*   CBC:  Recent Labs Lab 08/10/14 0849  WBC 5.1  NEUTROABS 3.9  HGB 3.6*  HCT 11.7*  MCV 62.9*  PLT 325   Cardiac Enzymes: No results found for this basename: CKTOTAL, CKMB, CKMBINDEX, TROPONINI,  in the last 168 hours BNP: No components found  with this basename: POCBNP,  CBG: No results found for this basename: GLUCAP,  in the last 168 hours  EKG: Normal sinus rhythm, no ST/T wave changes  Faye Ramsay, MD  [facility] Pager [phone]  If 7PM-7AM, please contact night-coverage [email or web address] Password [facility] 08/10/2014, 12:08 PM

## 2014-08-11 LAB — COMPREHENSIVE METABOLIC PANEL
ALT: 23 U/L (ref 0–35)
ANION GAP: 11 (ref 5–15)
AST: 40 U/L — ABNORMAL HIGH (ref 0–37)
Albumin: 2.7 g/dL — ABNORMAL LOW (ref 3.5–5.2)
Alkaline Phosphatase: 71 U/L (ref 39–117)
BUN: 5 mg/dL — ABNORMAL LOW (ref 6–23)
CALCIUM: 8.1 mg/dL — AB (ref 8.4–10.5)
CO2: 23 meq/L (ref 19–32)
CREATININE: 0.64 mg/dL (ref 0.50–1.10)
Chloride: 101 mEq/L (ref 96–112)
GLUCOSE: 98 mg/dL (ref 70–99)
Potassium: 3.3 mEq/L — ABNORMAL LOW (ref 3.7–5.3)
SODIUM: 135 meq/L — AB (ref 137–147)
Total Bilirubin: 0.5 mg/dL (ref 0.3–1.2)
Total Protein: 6.1 g/dL (ref 6.0–8.3)

## 2014-08-11 LAB — TYPE AND SCREEN
ABO/RH(D): A POS
Antibody Screen: NEGATIVE
UNIT DIVISION: 0
Unit division: 0

## 2014-08-11 LAB — CBC
HCT: 28.1 % — ABNORMAL LOW (ref 36.0–46.0)
HCT: 28 % — ABNORMAL LOW (ref 36.0–46.0)
HEMOGLOBIN: 9 g/dL — AB (ref 12.0–15.0)
Hemoglobin: 8.9 g/dL — ABNORMAL LOW (ref 12.0–15.0)
MCH: 21.6 pg — ABNORMAL LOW (ref 26.0–34.0)
MCH: 21.4 pg — ABNORMAL LOW (ref 26.0–34.0)
MCHC: 32 g/dL (ref 30.0–36.0)
MCHC: 31.8 g/dL (ref 30.0–36.0)
MCV: 67.5 fL — ABNORMAL LOW (ref 78.0–100.0)
MCV: 67.5 fL — ABNORMAL LOW (ref 78.0–100.0)
PLATELETS: 192 10*3/uL (ref 150–400)
PLATELETS: 189 10*3/uL (ref 150–400)
RBC: 4.16 MIL/uL (ref 3.87–5.11)
RBC: 4.15 MIL/uL (ref 3.87–5.11)
RDW: 20.8 % — ABNORMAL HIGH (ref 11.5–15.5)
RDW: 20.7 % — AB (ref 11.5–15.5)
WBC: 4.5 10*3/uL (ref 4.0–10.5)
WBC: 4.6 10*3/uL (ref 4.0–10.5)

## 2014-08-11 LAB — URINE CULTURE
COLONY COUNT: NO GROWTH
CULTURE: NO GROWTH

## 2014-08-11 LAB — LIPASE, BLOOD: LIPASE: 56 U/L (ref 11–59)

## 2014-08-11 LAB — TSH: TSH: 5.13 u[IU]/mL — ABNORMAL HIGH (ref 0.350–4.500)

## 2014-08-11 LAB — T-HELPER CELLS (CD4) COUNT (NOT AT ARMC)
CD4 T CELL ABS: 10 /uL — AB (ref 400–2700)
CD4 T CELL HELPER: 1 % — AB (ref 33–55)

## 2014-08-11 MED ORDER — POTASSIUM CHLORIDE CRYS ER 20 MEQ PO TBCR
40.0000 meq | EXTENDED_RELEASE_TABLET | Freq: Once | ORAL | Status: AC
  Administered 2014-08-11: 40 meq via ORAL
  Filled 2014-08-11: qty 2

## 2014-08-11 MED ORDER — PANTOPRAZOLE SODIUM 40 MG IV SOLR
40.0000 mg | Freq: Two times a day (BID) | INTRAVENOUS | Status: DC
  Administered 2014-08-11 – 2014-08-12 (×2): 40 mg via INTRAVENOUS
  Filled 2014-08-11 (×3): qty 40

## 2014-08-11 MED ORDER — FLUCONAZOLE 100 MG PO TABS
100.0000 mg | ORAL_TABLET | Freq: Every day | ORAL | Status: DC
  Administered 2014-08-11 – 2014-08-16 (×6): 100 mg via ORAL
  Filled 2014-08-11 (×6): qty 1

## 2014-08-11 MED ORDER — BOOST / RESOURCE BREEZE PO LIQD
1.0000 | Freq: Two times a day (BID) | ORAL | Status: DC
  Administered 2014-08-11 – 2014-08-13 (×3): 1 via ORAL

## 2014-08-11 NOTE — Progress Notes (Signed)
Patient ID: [name], female   DOB: May 04, 1976, 38 y.o.   MRN: [id number]  [facility] PROGRESS NOTE  BRIANY AYE RFF:[id number] DOB: [birth year] DOA: 08/10/2014 PCP: Philis Fendt, MD  Brief narrative: Pt is 38 yo female with HIV, Hep B, who presented to [facility] ED with main concern of one month duration of progressively worsening RUQ abdominal pain, throbbing and intermitted, 7/10 in severity when present, non radiating, with no specific urinary concerns, no fevers, chills, no sick contacts or exposures. Pt denies any blood in urine or stool. She has not seen her PCP for the problem.   In ED, pt noted to be hemodynamically stable but in mild distress due to pain. CBC notable for Hg ~ 3, ? Blood work error. 2 U PRBC ordered for transfusion. [facility] asked to admit for further evaluation.   Assessment and Plan:   Active Problems:  Abdominal pain  - etiology is not clear  - Ct abd with no specific acute findings to explain the etiology - continue to provide supportive care with IVF, analgesia, antiemetics as needed  Acute on chronic blood loss anemia  - transfused two unit of blood 8/26: Hg trend 3.6 --> 9, initial blood work likely an error - pt denies any active bleeding, no blood in urine or stool - her baseline Hg is ~8 - repeat CBC this afternoon and again in AM Elevated Lipase  - unclear if true pancreatitis  - pt is not taking any ART medications over one month - lipase is WNL this AM  Hypokalemia  - supplement and repeat level in AM  Transaminitis  - possibly from Hep B  - will repeat CMET In AM  HIV  - last CD4# 10 in January 2015  - repeat CD4#  Pending  - pt was on ART but has not been compliant with over one month - will consult with ID before initiating regimen   DVT prophylaxis  Lovenox SQ while pt is in hospital  Code Status: Full Family Communication: Pt at bedside Disposition Plan: Home when medically stable   IV Access:   Peripheral IV Procedures  and diagnostic studies:   US Abdomen Complete  08/10/2014   CLINICAL DATA:  Abdominal pain, hepatitis-B, elevated alk-phos/lipase  EXAM: ULTRASOUND ABDOMEN COMPLETE  COMPARISON:  None.  FINDINGS: Gallbladder:  Gallbladder sludge. No gallbladder wall thickening or pericholecystic fluid. Negative sonographic Murphy's sign.  Common bile duct:  Diameter: 4 mm.  Liver:  No focal lesion identified. Within normal limits in parenchymal echogenicity.  IVC:  No abnormality visualized.  Pancreas:  Visualized portions are unremarkable.  Spleen:  Measures 9.6 cm.  Calcified granulomata.  Right Kidney:  Length: 12.3 cm.  No mass or hydronephrosis.  Left Kidney:  Length: 12.0 cm.  No mass hydronephrosis.  Abdominal aorta:  No aneurysm visualized.  Other findings:  None.  IMPRESSION: Gallbladder sludge. No associated sonographic findings to suggest acute cholecystitis.  Otherwise negative abdominal ultrasound.   Electronically Signed   By: Julian Hy M.D.   On: 08/10/2014 08:50   Ct Abdomen Pelvis W Contrast  08/10/2014   CLINICAL DATA:  Nausea for 3 weeks. RIGHT-sided rib CABG pain. Abdominal pain for 1 week. Headache.  pain  EXAM: CT ABDOMEN AND PELVIS WITH CONTRAST  TECHNIQUE: Multidetector CT imaging of the abdomen and pelvis was performed using the standard protocol following bolus administration of intravenous contrast.  CONTRAST:  187m OMNIPAQUE IOHEXOL 300 MG/ML  SOLN  COMPARISON:  07/22/2014.  FINDINGS:  Musculoskeletal: L5-S1 spondylosis with vacuum disc and shallow disc protrusion, partially calcified. There are no aggressive osseous lesions.  Lung Bases: Nodular atelectasis in the RIGHT lower lobe near the RIGHT costophrenic sulcus.  Liver: Normal. Focal fatty infiltration adjacent to the falciform ligament.  Spleen:  Normal.  Gallbladder:  Small amount of dependently layering biliary sludge.  Common bile duct:  Normal.  Pancreas:  Normal.  Adrenal glands:  Normal bilaterally.  Kidneys: Normal enhancement.  Both ureters appear within normal limits.  Stomach:  Mostly collapsed.  No inflammatory changes.  Small bowel: Duodenum appears within normal limits. Proximal small bowel is normal. No mesenteric adenopathy. No bowel inflammatory changes. No obstruction.  Colon: Normal appendix. Ascending colon appears normal. Transverse and descending colon also within normal limits.  Pelvic Genitourinary: No free fluid. Urinary bladder collapsed. Fluid density incidentally noted in the vagina, probably related to menstrual cycle. Uterus appears normal. Bilateral ovarian cysts.  Vasculature: Normal.  Body Wall: Normal.  IMPRESSION: No interval change or acute abnormality.   Electronically Signed   By: Dereck Ligas M.D.   On: 08/10/2014 11:03    Medical Consultants:    None Other Consultants:   None Anti-Infectives:    Fluconazole for oral thrush   Faye Ramsay, MD  [facility] Pager [phone]  If 7PM-7AM, please contact night-coverage [email or web address] Password [facility] 08/11/2014, 8:52 AM   LOS: 1 day   HPI/Subjective: No events overnight.   Objective: Filed Vitals:   08/10/14 1545 08/10/14 1645 08/10/14 2133 08/11/14 0705  BP: 120/77 131/72 119/71 145/81  Pulse: 67 60 59 66  Temp: 98.3 F (36.8 C) 98.2 F (36.8 C) 98.1 F (36.7 C) 97.7 F (36.5 C)  TempSrc: Oral Oral Oral Oral  Resp: 16 20 20 16   Height:      Weight:      SpO2: 100% 100% 100% 100%    Intake/Output Summary (Last 24 hours) at 08/11/14 0852 Last data filed at 08/11/14 0710  Gross per 24 hour  Intake 1607.91 ml  Output    875 ml  Net 732.91 ml    Exam:   General:  Pt is alert, follows commands appropriately, not in acute distress, oral thrush   Cardiovascular: Regular rate and rhythm, S1/S2, no murmurs, no rubs, no gallops  Respiratory: Clear to auscultation bilaterally, no wheezing, no crackles, no rhonchi  Abdomen: Soft, non tender, non distended, bowel sounds present, no guarding  Extremities: No edema, pulses  DP and PT palpable bilaterally  Neuro: Grossly nonfocal  Data Reviewed: Basic Metabolic Panel:  Recent Labs Lab 08/10/14 0849 08/11/14 0508  NA 135* 135*  K 3.2* 3.3*  CL 97 101  CO2 22 23  GLUCOSE 96 98  BUN 6 5*  CREATININE 0.61 0.64  CALCIUM 9.4 8.1*   Liver Function Tests:  Recent Labs Lab 08/10/14 0849 08/11/14 0508  AST 52* 40*  ALT 32 23  ALKPHOS 86 71  BILITOT 0.4 0.5  PROT 7.5 6.1  ALBUMIN 3.4* 2.7*    Recent Labs Lab 08/10/14 0849 08/11/14 0508  LIPASE 62* 56   CBC:  Recent Labs Lab 08/10/14 0849 08/11/14 0508  WBC 5.1 4.5  NEUTROABS 3.9  --   HGB 3.6* 9.0*  HCT 11.7* 28.1*  MCV 62.9* 67.5*  PLT 325 192    Scheduled Meds: . antiseptic oral rinse  7 mL Mouth Rinse q12n4p  . chlorhexidine  15 mL Mouth Rinse BID  . enoxaparin (LOVENOX) injection  40 mg Subcutaneous Q24H  . fluconazole  100 mg Oral Daily  . potassium chloride  40 mEq Oral Once   Continuous Infusions: . sodium chloride 100 mL/hr at 08/11/14 9047

## 2014-08-11 NOTE — Progress Notes (Signed)
INITIAL NUTRITION ASSESSMENT  DOCUMENTATION CODES Per approved criteria  -Not Applicable   INTERVENTION: -Recommend Resource Breeze po BID, each supplement provides 250 kcal and 9 grams of protein -Encouraged intake of soft, bland, easy to tolerate foods. Consider modification to Low Fat diet pending lipase levels -Consider SLP evaluation if no improvement -Will continue to monitor   NUTRITION DIAGNOSIS: Inadequate oral intake related to nausea/vomiting/difficulty swallowing as evidenced by PO intake <75%.   Goal: Pt to meet >/= 90% of their estimated nutrition needs    Monitor:  Total protein/energy intake, labs, weights, GI profile, swallow profile  Reason for Assessment: Consult/MST  38 y.o. female  Admitting Dx: <principal problem not specified>  ASSESSMENT: Pt is 38 yo female with HIV, Hep B, who presented to [facility] ED with main concern of one month duration of progressively worsening RUQ abdominal pain, throbbing and intermitted, 7/10 in severity when present, non radiating, with no specific urinary concerns, no fevers, chills, no sick contacts or exposures.  -Pt reported one month of decreased appetite d/t feelings of nausea,vomiting, and dysphagia -Denied significant decrease in intake as pt was able to find foods she could tolerate. Diet recall indicates pt able to tolerate softer foods-applesauce, mashed potatoes, chicken salad, and hot cereals. Can tolerate meats w/extra gravies. Started protein supplement regimen one week ago; however stopped drinking as they caused her abd pain/nausea -Reported being diagnosed with esophagitis in the past several weeks that resulted in burning/throat pain while swallowing; however this has since resolved and described new feelings of dysphagia that made eating solid foods such as bread difficult  -Endorsed weight loss, reported 30 lbs over past one month; however previous medical records indicate a 18 lb weight loss (10% body weight,  significant for time frame) -Current PO intake 10%, tolerated solid foods at breakfast. Reported feelings of nausea/emesis last night after eating chicken salad -Was willing to try Resource Breeze for additional nutrients while appetite returns to baseline -MD noted pt possible for pancreatitis, will continue to monitor and recommend low fat diet restrictions as warranted.  -Consider SLP eval if pt continues with dysphagia like symptoms  Height: Ht Readings from Last 1 Encounters:  08/10/14 6' (1.829 m)    Weight: Wt Readings from Last 1 Encounters:  08/10/14 162 lb 11.2 oz (73.8 kg)    Ideal Body Weight: 160 lbs  % Ideal Body Weight: 101 lbs  Wt Readings from Last 10 Encounters:  08/10/14 162 lb 11.2 oz (73.8 kg)  01/13/14 180 lb (81.647 kg)  01/03/14 180 lb (81.647 kg)  12/27/13 174 lb 1.6 oz (78.971 kg)  09/14/13 159 lb (72.122 kg)  08/30/13 149 lb (67.586 kg)  08/10/13 138 lb 8 oz (62.823 kg)  07/09/13 125 lb 7.1 oz (56.9 kg)  07/09/13 125 lb 7.1 oz (56.9 kg)  04/16/12 155 lb (70.308 kg)    Usual Body Weight: 180 lbs  % Usual Body Weight: 90%  BMI:  Body mass index is 22.06 kg/(m^2).  Estimated Nutritional Needs: Kcal: 1900-2100 Protein: 90-100 gram Fluid: >/-1900 ml/daily  Skin: WDL  Diet Order: General  EDUCATION NEEDS: -No education needs identified at this time   Intake/Output Summary (Last 24 hours) at 08/11/14 1004 Last data filed at 08/11/14 0710  Gross per 24 hour  Intake 1607.91 ml  Output    875 ml  Net 732.91 ml    Last BM: 8/25   Labs:   Recent Labs Lab 08/10/14 0849 08/11/14 0508  NA 135* 135*  K 3.2*  3.3*  CL 97 101  CO2 22 23  BUN 6 5*  CREATININE 0.61 0.64  CALCIUM 9.4 8.1*  GLUCOSE 96 98    CBG (last 3)  No results found for this basename: GLUCAP,  in the last 72 hours  Scheduled Meds: . antiseptic oral rinse  7 mL Mouth Rinse q12n4p  . chlorhexidine  15 mL Mouth Rinse BID  . enoxaparin (LOVENOX) injection  40  mg Subcutaneous Q24H  . fluconazole  100 mg Oral Daily  . potassium chloride  40 mEq Oral Once    Continuous Infusions: . sodium chloride 100 mL/hr at 08/11/14 5621    Past Medical History  Diagnosis Date  . HIV positive   . Hepatitis B     /E-chart  . Microcytic anemia     h/o per E-chart  . 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 Noncompliance with medication regimen     /e-chart  . 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 Pyelonephritis     h/o per E-chart  . Thyroid disease   . Depression   . Asthma     inhaler 2xday    Past Surgical History  Procedure Laterality Date  . Thyroid surgery    . Thyroid lobectomy  08/2002    left & isthmectomy; for benign thyroid adenoma/E-chart  . Tubal ligation  07/2002    /E-chart  . Esophagogastroduodenoscopy N/A 07/13/2013    Procedure: ESOPHAGOGASTRODUODENOSCOPY (EGD);  Surgeon: Beryle Beams, MD;  Location: [facility];  Service: Endoscopy;  Laterality: N/A;    Atlee Abide MS RD LDN Clinical Dietitian [phone]

## 2014-08-11 NOTE — Care Management Note (Signed)
CARE MANAGEMENT NOTE 08/11/2014  Patient:  [name], [name]   Account Number:  [id number]  Date Initiated:  08/11/2014  Documentation initiated by:  Leafy Kindle  Subjective/Objective Assessment:   38 yo admitted with abd pain.  Hx of HIV positive  .  Hepatitis B  .  Microcytic anemia     Action/Plan:   From home with children   Anticipated DC Date:  08/13/2014   Anticipated DC Plan:  HOME/SELF CARE         Choice offered to / List presented to:             Status of service:  In process, will continue to follow Medicare Important Message given?   (If response is "NO", the following Medicare IM given date fields will be blank) Date Medicare IM given:   Medicare IM given by:   Date Additional Medicare IM given:   Additional Medicare IM given by:    Discharge Disposition:    Per UR Regulation:  Reviewed for med. necessity/level of care/duration of stay  If discussed at [place] of Stay Meetings, dates discussed:    Comments:  08/11/14 [name] Doctor RN,BSN, Pt receiving  IVF, analgesia, antiemetics as needed.  No CM needs identified at this time.  Will continue to follow for DC needs.

## 2014-08-12 ENCOUNTER — Encounter (HOSPITAL_COMMUNITY): Payer: Self-pay | Admitting: Physician Assistant

## 2014-08-12 ENCOUNTER — Encounter (HOSPITAL_COMMUNITY)
Admission: EM | Disposition: A | Discharge status: Home / Self Care | Payer: Self-pay | Source: Home / Self Care | Attending: Internal Medicine

## 2014-08-12 DIAGNOSIS — R109 Unspecified abdominal pain: Secondary | ICD-10-CM

## 2014-08-12 DIAGNOSIS — R52 Pain, unspecified: Secondary | ICD-10-CM

## 2014-08-12 DIAGNOSIS — K2210 Ulcer of esophagus without bleeding: Secondary | ICD-10-CM

## 2014-08-12 HISTORY — PX: ESOPHAGOGASTRODUODENOSCOPY: SHX5428

## 2014-08-12 LAB — COMPREHENSIVE METABOLIC PANEL
ALBUMIN: 2.6 g/dL — AB (ref 3.5–5.2)
ALK PHOS: 75 U/L (ref 39–117)
ALT: 22 U/L (ref 0–35)
AST: 39 U/L — ABNORMAL HIGH (ref 0–37)
Anion gap: 10 (ref 5–15)
BILIRUBIN TOTAL: 0.4 mg/dL (ref 0.3–1.2)
BUN: 3 mg/dL — AB (ref 6–23)
CHLORIDE: 103 meq/L (ref 96–112)
CO2: 22 mEq/L (ref 19–32)
Calcium: 8.1 mg/dL — ABNORMAL LOW (ref 8.4–10.5)
Creatinine, Ser: 0.66 mg/dL (ref 0.50–1.10)
GFR calc non Af Amer: >90 mL/min (ref >90)
GLUCOSE: 93 mg/dL (ref 70–99)
Potassium: 3.4 mEq/L — ABNORMAL LOW (ref 3.7–5.3)
Sodium: 135 mEq/L — ABNORMAL LOW (ref 137–147)
TOTAL PROTEIN: 6 g/dL (ref 6.0–8.3)

## 2014-08-12 LAB — CBC
HEMATOCRIT: 27.2 % — AB (ref 36.0–46.0)
HEMOGLOBIN: 9 g/dL — AB (ref 12.0–15.0)
MCH: 22 pg — ABNORMAL LOW (ref 26.0–34.0)
MCHC: 33.1 g/dL (ref 30.0–36.0)
MCV: 66.5 fL — ABNORMAL LOW (ref 78.0–100.0)
Platelets: 191 10*3/uL (ref 150–400)
RBC: 4.09 MIL/uL (ref 3.87–5.11)
RDW: 20.9 % — ABNORMAL HIGH (ref 11.5–15.5)
WBC: 4.6 10*3/uL (ref 4.0–10.5)

## 2014-08-12 SURGERY — EGD (ESOPHAGOGASTRODUODENOSCOPY)
Anesthesia: Moderate Sedation

## 2014-08-12 MED ORDER — FENTANYL CITRATE 0.05 MG/ML IJ SOLN
INTRAMUSCULAR | Status: AC
  Filled 2014-08-12: qty 2

## 2014-08-12 MED ORDER — GI COCKTAIL ~~LOC~~
30.0000 mL | Freq: Three times a day (TID) | ORAL | Status: DC | PRN
  Administered 2014-08-12 – 2014-08-20 (×8): 30 mL via ORAL
  Filled 2014-08-12 (×9): qty 30

## 2014-08-12 MED ORDER — SODIUM CHLORIDE 0.9 % IV SOLN
INTRAVENOUS | Status: DC
  Administered 2014-08-12: 500 mL via INTRAVENOUS

## 2014-08-12 MED ORDER — MIDAZOLAM HCL 10 MG/2ML IJ SOLN
INTRAMUSCULAR | Status: DC | PRN
  Administered 2014-08-12 (×2): 2.5 mg via INTRAVENOUS

## 2014-08-12 MED ORDER — DIPHENHYDRAMINE HCL 50 MG/ML IJ SOLN
INTRAMUSCULAR | Status: AC
  Filled 2014-08-12: qty 1

## 2014-08-12 MED ORDER — FENTANYL CITRATE 0.05 MG/ML IJ SOLN
INTRAMUSCULAR | Status: DC | PRN
  Administered 2014-08-12 (×2): 25 ug via INTRAVENOUS

## 2014-08-12 MED ORDER — POLYETHYLENE GLYCOL 3350 17 G PO PACK
17.0000 g | PACK | Freq: Every day | ORAL | Status: DC
  Administered 2014-08-12 – 2014-08-19 (×7): 17 g via ORAL
  Filled 2014-08-12 (×9): qty 1

## 2014-08-12 MED ORDER — MIDAZOLAM HCL 10 MG/2ML IJ SOLN
INTRAMUSCULAR | Status: AC
  Filled 2014-08-12: qty 2

## 2014-08-12 MED ORDER — PANTOPRAZOLE SODIUM 40 MG PO TBEC
40.0000 mg | DELAYED_RELEASE_TABLET | Freq: Every day | ORAL | Status: DC
  Administered 2014-08-13 – 2014-08-17 (×5): 40 mg via ORAL
  Filled 2014-08-12 (×5): qty 1

## 2014-08-12 MED ORDER — POTASSIUM CHLORIDE CRYS ER 20 MEQ PO TBCR
40.0000 meq | EXTENDED_RELEASE_TABLET | Freq: Once | ORAL | Status: AC
  Administered 2014-08-12: 40 meq via ORAL
  Filled 2014-08-12: qty 2

## 2014-08-12 MED ORDER — BUTAMBEN-TETRACAINE-BENZOCAINE 2-2-14 % EX AERO
INHALATION_SPRAY | CUTANEOUS | Status: DC | PRN
  Administered 2014-08-12: 2 via TOPICAL

## 2014-08-12 NOTE — Consult Note (Signed)
Consultation  Referring Provider: [facility]     Primary Care Physician:  Philis Fendt, MD Primary Gastroenterologist:  unassigned       Reason for Consultation:  Abdominal pain            HPI:   [name] is a 38 y.o. female with HIV and ? HBV. She has not followed up with ID and is untreated. She was evaluated one year ago by Dr. Benson Norway for odynophagia. She had a large esoph ulcer on EGD. Bx c/w reflux, no viral or fungal organisms found. Patient was in ED earlier this month with epigastric pain, odynophagia. LFTs and lipase okay. CBC at baseline. CTscan revealed gb sludge, no other findings. She was felt to have esophagitis (probably candida) and costochondritis. She was given fluconazole. Also diagnosed with UTI and given antibiotics. Odynophagia resolved but now having epig/RUQ pain associated with nausea. Pain radiates through to her back, it is worse with meals but also worse with movement. Heating pad helps.Patient came back to ED two days ago for this pain. Another CTscan done and unrevealing. Labs at baseline. Patient has been on a daily PPI for at least a year. She doesn't take NSAIDs.     Past Medical History  Diagnosis Date  . HIV positive   . Hepatitis B     /E-chart  . Microcytic anemia     h/o per E-chart  . 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 Noncompliance with medication regimen     /e-chart  . 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 Pyelonephritis     h/o per E-chart  . Thyroid disease   . Depression   . Asthma     inhaler 2xday    Past Surgical History  Procedure Laterality Date  . Thyroid surgery    . Thyroid lobectomy  08/2002    left & isthmectomy; for benign thyroid adenoma/E-chart  . Tubal ligation  07/2002    /E-chart  . Esophagogastroduodenoscopy N/A 07/13/2013    Procedure: ESOPHAGOGASTRODUODENOSCOPY (EGD);  Surgeon: Beryle Beams, MD;  Location: [facility];  Service: Endoscopy;  Laterality: N/A;    Family History  Problem Relation Age of Onset  . Cancer Mother   . Diabetes Mother   . Diabetes Father   . Heart disease Father   . Cancer Sister   . Diabetes Sister   . Asthma Son      History  Substance Use Topics  . Smoking status: Never Smoker   . Smokeless tobacco: Never Used  . Alcohol Use: Yes     Comment: occ    Prior to Admission medications   Medication Sig Start Date End Date Taking? Authorizing Provider  acetaminophen (TYLENOL) 500 MG tablet Take 500 mg by mouth every 6 (six) hours as needed for mild pain or headache.   Yes Historical Provider, MD  albuterol (PROVENTIL HFA;VENTOLIN HFA) 108 (90 BASE) MCG/ACT inhaler Inhale 2 puffs into the lungs every 6 (six) hours as needed for wheezing.   Yes Historical Provider, MD  HYDROcodone-acetaminophen (NORCO) 7.5-325 MG per tablet Take 1 tablet by mouth every 6 (six) hours as needed for moderate pain.   Yes Historical Provider, MD    Current Facility-Administered Medications  Medication Dose Route Frequency Provider Last Rate Last Dose  . 0.9 %  sodium chloride infusion   Intravenous Continuous Theodis Blaze, MD 100 mL/hr at  08/11/14 1701    . antiseptic oral rinse (CPC / CETYLPYRIDINIUM CHLORIDE 0.05%) solution 7 mL  7 mL Mouth Rinse q12n4p Theodis Blaze, MD   7 mL at 08/11/14 1200  . chlorhexidine (PERIDEX) 0.12 % solution 15 mL  15 mL Mouth Rinse BID Theodis Blaze, MD   15 mL at 08/11/14 0800  . enoxaparin (LOVENOX) injection 40 mg  40 mg Subcutaneous Q24H Theodis Blaze, MD      . feeding supplement (RESOURCE BREEZE) (RESOURCE BREEZE) liquid 1 Container  1 Container Oral BID BM Hazle Coca, RD   1 Container at 08/11/14 1400  . fluconazole (DIFLUCAN) tablet 100 mg  100 mg Oral Daily Theodis Blaze, MD   100 mg at 08/11/14 1211  . HYDROmorphone (DILAUDID) injection 1 mg  1 mg Intravenous Q2H PRN Theodis Blaze, MD   1 mg at 08/12/14 0731  . ondansetron (ZOFRAN)  tablet 4 mg  4 mg Oral Q6H PRN Theodis Blaze, MD       Or  . ondansetron [facility]) injection 4 mg  4 mg Intravenous Q6H PRN Theodis Blaze, MD   4 mg at 08/12/14 0000  . oxyCODONE (Oxy IR/ROXICODONE) immediate release tablet 5 mg  5 mg Oral Q4H PRN Theodis Blaze, MD      . pantoprazole (PROTONIX) injection 40 mg  40 mg Intravenous Q12H Theodis Blaze, MD   40 mg at 08/11/14 2114    Allergies as of 08/10/2014 - Review Complete 08/10/2014  Allergen Reaction Noted  . Bactrim Itching 11/05/2011  . Dapsone Itching 06/18/2013  . Orange fruit Itching 02/20/2012  . Peanut-containing drug products Hives 11/05/2011  . Shellfish allergy Hives 11/05/2011     Review of Systems:    All systems reviewed and negative except where noted in HPI.    Physical Exam:  Vital signs in last 24 hours: Temp:  [97.8 F (36.6 C)-98.3 F (36.8 C)] 98.3 F (36.8 C) (08/28 0550) Pulse Rate:  [62-74] 63 (08/28 0550) Resp:  [16-18] 16 (08/28 0550) BP: (120-144)/(70-81) 144/81 mmHg (08/28 0550) SpO2:  [100 %] 100 % (08/28 0550) Last BM Date: 08/09/14 General:   Pleasant black female in NAD Head:  Normocephalic and atraumatic. Eyes:   No icterus.   Conjunctiva pink. Ears:  Normal auditory acuity. Neck:  Supple; no masses felt Lungs:  Respirations even and unlabored. Lungs clear to auscultation bilaterally.   No wheezes, crackles, or rhonchi.  Heart:  Regular rate and rhythm;  murmur heard. Abdomen:  Soft, nondistended, nontender. Normal bowel sounds. No appreciable masses or hepatomegaly.  Rectal:  Not performed.  Msk:  Symmetrical without gross deformities. Significantly tender along right lower anterior rib cage. Extremities:  Without edema. Neurologic:  Alert and  oriented x4;  grossly normal neurologically. Skin:  Intact without significant lesions or rashes. Cervical Nodes:  No significant cervical adenopathy. Psych:  Alert and cooperative. Normal affect.  LAB RESULTS:  Recent Labs  08/11/14 0508  08/11/14 1300 08/12/14 0400  WBC 4.5 4.6 4.6  HGB 9.0* 8.9* 9.0*  HCT 28.1* 28.0* 27.2*  PLT 192 189 191   BMET  Recent Labs  08/10/14 0849 08/11/14 0508 08/12/14 0400  NA 135* 135* 135*  K 3.2* 3.3* 3.4*  CL 97 101 103  CO2 22 23 22   GLUCOSE 96 98 93  BUN 6 5* 3*  CREATININE 0.61 0.64 0.66  CALCIUM 9.4 8.1* 8.1*   LFT  Recent Labs  08/12/14 0400  PROT 6.0  ALBUMIN 2.6*  AST 39*  ALT 22  ALKPHOS 75  BILITOT 0.4    STUDIES: Ct Abdomen Pelvis W Contrast  08/10/2014   CLINICAL DATA:  Nausea for 3 weeks. RIGHT-sided rib CABG pain. Abdominal pain for 1 week. Headache.  pain  EXAM: CT ABDOMEN AND PELVIS WITH CONTRAST  TECHNIQUE: Multidetector CT imaging of the abdomen and pelvis was performed using the standard protocol following bolus administration of intravenous contrast.  CONTRAST:  131mL OMNIPAQUE IOHEXOL 300 MG/ML  SOLN  COMPARISON:  07/22/2014.  FINDINGS: Musculoskeletal: L5-S1 spondylosis with vacuum disc and shallow disc protrusion, partially calcified. There are no aggressive osseous lesions.  Lung Bases: Nodular atelectasis in the RIGHT lower lobe near the RIGHT costophrenic sulcus.  Liver: Normal. Focal fatty infiltration adjacent to the falciform ligament.  Spleen:  Normal.  Gallbladder:  Small amount of dependently layering biliary sludge.  Common bile duct:  Normal.  Pancreas:  Normal.  Adrenal glands:  Normal bilaterally.  Kidneys: Normal enhancement. Both ureters appear within normal limits.  Stomach:  Mostly collapsed.  No inflammatory changes.  Small bowel: Duodenum appears within normal limits. Proximal small bowel is normal. No mesenteric adenopathy. No bowel inflammatory changes. No obstruction.  Colon: Normal appendix. Ascending colon appears normal. Transverse and descending colon also within normal limits.  Pelvic Genitourinary: No free fluid. Urinary bladder collapsed. Fluid density incidentally noted in the vagina, probably related to menstrual cycle.  Uterus appears normal. Bilateral ovarian cysts.  Vasculature: Normal.  Body Wall: Normal.  IMPRESSION: No interval change or acute abnormality.   Electronically Signed   By: Dereck Ligas M.D.   On: 08/10/2014 11:03     PREVIOUS ENDOSCOPIES:            EGD July 2014 for dysphagia - large 5cm prox esoph ulcer.Bx = inflamation.  Gastric bx = minimally active gastritis, neg h.pylori   Impression / Plan:   55. 38 year old female with admitted with upper abdominal pain /nausea. Labs, ultrasound and CTscan nondiagnostic. She does have gb sludge but no findings to suggest cholecystitis. Patient points to RUQ but she is actually most tender over right lower rib cage raising suspicion that this is more musculoskeletal in nature though doesn't explain why pain also worse with eating. Will consider further gallbladder workup  2. HIV, CD4 in 10. Patient off HIV meds for a month which she states is because of swallowing problems. Looks like there have been medication adherence problems through the years.   3. Hx of esophageal ulcer July 2014. On daily PPI now  4. Microcytic anemia, likely multifactorial (menses, nutritional, chronic disease). Can't exclude low grade chronic GI blood loss from erosive disease. No overt GI bleeding.  5. Constipation. Last BM 3 or so days ago. She feels a little constipated with make sure Miralax is ordered.   Thanks   LOS: 2 days   Tye Savoy  08/12/2014, 8:57 AM    ________________________________________________________________________  Velora Heckler GI MD note:  I personally examined the patient, reviewed the data and agree with the assessment and plan described above.  Planning on EGD later today for RUQ, epigastric pain.     Owens Loffler, MD [facility] Pager [phone]

## 2014-08-12 NOTE — Interval H&P Note (Signed)
History and Physical Interval Note:  08/12/2014 1:28 PM  [name]  has presented today for surgery, with the diagnosis of anemia,  abd pain  The various methods of treatment have been discussed with the patient and family. After consideration of risks, benefits and other options for treatment, the patient has consented to  Procedure(s): ESOPHAGOGASTRODUODENOSCOPY (EGD) (N/A) as a surgical intervention .  The patient's history has been reviewed, patient examined, no change in status, stable for surgery.  I have reviewed the patient's chart and labs.  Questions were answered to the patient's satisfaction.     Milus Banister

## 2014-08-12 NOTE — Progress Notes (Signed)
Patient ID: [name], female   DOB: [birth year], 38 y.o.   MRN: [id number]  [facility] PROGRESS NOTE  [name] QHU:[id number] DOB: 1976/05/19 DOA: 08/10/2014 PCP: Philis Fendt, MD  Brief narrative:  Pt is 38 yo female with HIV, Hep B, who presented to [facility] ED with main concern of one month duration of progressively worsening RUQ abdominal pain, throbbing and intermitted, 7/10 in severity when present, non radiating, with no specific urinary concerns, no fevers, chills, no sick contacts or exposures. Pt denies any blood in urine or stool. She has not seen her PCP for the problem.   In ED, pt noted to be hemodynamically stable but in mild distress due to pain. CBC notable for Hg ~ 3, ? Blood work error. 2 U PRBC ordered for transfusion. [facility] asked to admit for further evaluation.   Assessment and Plan:   Active Problems:  Abdominal pain  - possibly from ulcers noted on EGD in the setting of HIV - Ct abd with no specific acute findings to explain the etiology  - continue Protonix and will also consult ID for recommendations on treatment  Acute on chronic blood loss anemia  - transfused two unit of blood 8/26: Hg trend 3.6 --> 9, initial blood work likely an error  - pt denies any active bleeding, no blood in urine or stool  - her baseline Hg is ~8  - repeat CBC in AM  Elevated Lipase  - unclear if true pancreatitis  - pt is not taking any ART medications over one month  - lipase is WNL  Hypokalemia  - continue to supplement and repeat level in AM  Transaminitis  - possibly from Hep B  - will repeat CMET In AM  HIV  - last CD4# 10 in January 2015  - repeat CD4# 10 - pt was on ART but has not been compliant since January 2015  - will consult with ID before initiating regimen   DVT prophylaxis  Lovenox SQ while pt is in hospital  Code Status: Full  Family Communication: Pt at bedside  Disposition Plan: Home when medically stable   IV Access:   Peripheral  IV Procedures and diagnostic studies:    [place] Abd  08/10/2014 Gallbladder sludge. No associated sonographic findings to suggest acute cholecystitis.   CT ABDOMEN AND PELVIS W/C 8/26 No interval change or acute abnormality.   EGD 8/28 by Dr. Ardis Hughs There were two large ulcers in the mid to distal esophagus (each 3-4cm). Neither appeared maligant. The most distal ulcer created incomplete stricture. Biopsies were taken from proximal ulcer edge and ulcer bed. The examination to the distal esophugus was otherwise normal. 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 Benson Norway. Restart HIV medications.   Medical Consultants:   GI Other Consultants:   None Anti-Infectives:   Fluconazole for oral thrush 8/27 -->  Madison Ramsay, MD  [facility] Pager [phone]  If 7PM-7AM, please contact night-coverage [email or web address] Password [facility] 08/12/2014, 3:18 PM   LOS: 2 days   HPI/Subjective: No events overnight.   Objective: Filed Vitals:   08/12/14 1350 08/12/14 1356 08/12/14 1407 08/12/14 1451  BP:  142/81 141/83 142/74  Pulse: 60 58 57 62  Temp:  99.7 F (37.6 C)  98.5 F (36.9 C)  TempSrc:  Tympanic  Oral  Resp: 13 14 13 16   Height:      Weight:  SpO2: 100% 100% 100% 100%    Intake/Output Summary (Last 24 hours) at 08/12/14 1518 Last data filed at 08/12/14 1324  Gross per 24 hour  Intake   4520 ml  Output   1300 ml  Net   3220 ml    Exam:   General:  Pt is alert, follows commands appropriately, not in acute distress  Cardiovascular: Regular rate and rhythm, S1/S2, no murmurs, no rubs, no gallops  Respiratory: Clear to auscultation bilaterally, no wheezing, no crackles, no rhonchi  Abdomen: Soft, tender in epigastric area, non distended, bowel sounds present, no guarding  Extremities: No edema, pulses DP and PT palpable bilaterally  Neuro: Grossly nonfocal  Data  Reviewed: Basic Metabolic Panel:  Recent Labs Lab 08/10/14 0849 08/11/14 0508 08/12/14 0400  NA 135* 135* 135*  K 3.2* 3.3* 3.4*  CL 97 101 103  CO2 22 23 22   GLUCOSE 96 98 93  BUN 6 5* 3*  CREATININE 0.61 0.64 0.66  CALCIUM 9.4 8.1* 8.1*   Liver Function Tests:  Recent Labs Lab 08/10/14 0849 08/11/14 0508 08/12/14 0400  AST 52* 40* 39*  ALT 32 23 22  ALKPHOS 86 71 75  BILITOT 0.4 0.5 0.4  PROT 7.5 6.1 6.0  ALBUMIN 3.4* 2.7* 2.6*    Recent Labs Lab 08/10/14 0849 08/11/14 0508  LIPASE 62* 56   CBC:  Recent Labs Lab 08/10/14 0849 08/11/14 0508 08/11/14 1300 08/12/14 0400  WBC 5.1 4.5 4.6 4.6  NEUTROABS 3.9  --   --   --   HGB 3.6* 9.0* 8.9* 9.0*  HCT 11.7* 28.1* 28.0* 27.2*  MCV 62.9* 67.5* 67.5* 66.5*  PLT 325 192 189 191   Recent Results (from the past 240 hour(s))  URINE CULTURE     Status: None   Collection Time    08/10/14  5:53 PM      Result Value Ref Range Status   Specimen Description URINE, RANDOM   Final   Special Requests NONE   Final   Culture  Setup Time     Final   Value: 08/10/2014 21:57     Performed at [place]     Final   Value: NO GROWTH     Performed at [facility]   Culture     Final   Value: NO GROWTH     Performed at [facility]   Report Status 08/11/2014 FINAL   Final     Scheduled Meds: . antiseptic oral rinse  7 mL Mouth Rinse q12n4p  . chlorhexidine  15 mL Mouth Rinse BID  . enoxaparin (LOVENOX) injection  40 mg Subcutaneous Q24H  . feeding supplement (RESOURCE BREEZE)  1 Container Oral BID BM  . fluconazole  100 mg Oral Daily  . [START ON 08/13/2014] pantoprazole  40 mg Oral Daily  . polyethylene glycol  17 g Oral Daily   Continuous Infusions: . sodium chloride 50 mL/hr at 08/12/14 [phone]

## 2014-08-12 NOTE — Op Note (Signed)
[facility] [place], [address]   ENDOSCOPY PROCEDURE REPORT  PATIENT: [name], [name]  MR#: [id number] BIRTHDATE: 16-Apr-1976 , 38  yrs. old GENDER: Female ENDOSCOPIST: Milus Banister, MD PROCEDURE DATE:  08/12/2014 PROCEDURE:  EGD w/ biopsy ASA CLASS:     Class III INDICATIONS:  abdominal pain, IDA; EGD 1 year ago Dr.  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 MEDICATIONS: Fentanyl 75 mcg IV and Versed 7 mg IV TOPICAL ANESTHETIC: Cetacaine Spray  DESCRIPTION OF PROCEDURE: After the risks benefits and alternatives of the procedure were thoroughly explained, informed consent was obtained.  The Pentax Gastroscope [id number] endoscope was introduced through the mouth and advanced to the esophagus lower. Without limitations.  The instrument was slowly withdrawn as the mucosa was fully examined.     There were two large ulcers in the mid to distal esophagus (each 3-4cm).  Neither appeared maligant.  The most distal ulcer created incomplete stricture which I could not pass with standard adult gastroscope.  Biopsies were taken from proximal ulcer edge and ulcer bed.  The examination to the distal esophugus was otherwise normal.  Retroflexion was not performed.     The scope was then withdrawn from the patient and the procedure completed.  COMPLICATIONS: There were no complications. ENDOSCOPIC IMPRESSION: There were two large ulcers in the mid to distal esophagus (each 3-4cm).  Neither appeared maligant.  The most distal ulcer created incomplete stricture which I could not pass with standard adult gastroscope.  Biopsies were taken from proximal ulcer edge and ulcer bed.  The examination to the distal esophugus was otherwise normal.  RECOMMENDATIONS: These esophageal ulcers are likely primary  HIV ulcers (rather than HSV, CMV, malignancy) as was the case in 2014 with EGD Dr. Benson Norway. Biopsies were taken.  Her CD4 count has been 10 or less for 1-2 years.  I am surprised her dysphagia has improved over the past few weeks given the level of stricture caused by the esophageal ulcer. I cannot explain her abdominal pains.  Her IDA is probably HIV related, heavy menses related, perhaps slow blood loss from esophageal ulcers.  I think most important is that she restart her HIV therapy while in hospital. She has no showed for infectious disease follow up appts since 12/2013 and that relationship should be be re-established. No further GI workup is planned.  Will order clears, advance as tolerated diet.  I will contact her biopsy results from esophageal ulcers.    eSigned:  Milus Banister, MD 08/12/2014 1:57 PM

## 2014-08-12 NOTE — H&P (View-Only) (Signed)
Consultation  Referring Provider: [facility]     Primary Care Physician:  Philis Fendt, MD Primary Gastroenterologist:  unassigned       Reason for Consultation:  Abdominal pain            HPI:   [name] is a 38 y.o. female with HIV and ? HBV. She has not followed up with ID and is untreated. She was evaluated one year ago by Dr. Benson Norway for odynophagia. She had a large esoph ulcer on EGD. Bx c/w reflux, no viral or fungal organisms found. Patient was in ED earlier this month with epigastric pain, odynophagia. LFTs and lipase okay. CBC at baseline. CTscan revealed gb sludge, no other findings. She was felt to have esophagitis (probably candida) and costochondritis. She was given fluconazole. Also diagnosed with UTI and given antibiotics. Odynophagia resolved but now having epig/RUQ pain associated with nausea. Pain radiates through to her back, it is worse with meals but also worse with movement. Heating pad helps.Patient came back to ED two days ago for this pain. Another CTscan done and unrevealing. Labs at baseline. Patient has been on a daily PPI for at least a year. She doesn't take NSAIDs.     Past Medical History  Diagnosis Date  . HIV positive   . Hepatitis B     /E-chart  . Microcytic anemia     h/o per E-chart  . 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 Noncompliance with medication regimen     /e-chart  . 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 Pyelonephritis     h/o per E-chart  . Thyroid disease   . Depression   . Asthma     inhaler 2xday    Past Surgical History  Procedure Laterality Date  . Thyroid surgery    . Thyroid lobectomy  08/2002    left & isthmectomy; for benign thyroid adenoma/E-chart  . Tubal ligation  07/2002    /E-chart  . Esophagogastroduodenoscopy N/A 07/13/2013    Procedure: ESOPHAGOGASTRODUODENOSCOPY (EGD);  Surgeon: Beryle Beams, MD;  Location: [facility]  ENDOSCOPY;  Service: Endoscopy;  Laterality: N/A;    Family History  Problem Relation Age of Onset  . Cancer Mother   . Diabetes Mother   . Diabetes Father   . Heart disease Father   . Cancer Sister   . Diabetes Sister   . Asthma Son      History  Substance Use Topics  . Smoking status: Never Smoker   . Smokeless tobacco: Never Used  . Alcohol Use: Yes     Comment: occ    Prior to Admission medications   Medication Sig Start Date End Date Taking? Authorizing Provider  acetaminophen (TYLENOL) 500 MG tablet Take 500 mg by mouth every 6 (six) hours as needed for mild pain or headache.   Yes Historical Provider, MD  albuterol (PROVENTIL HFA;VENTOLIN HFA) 108 (90 BASE) MCG/ACT inhaler Inhale 2 puffs into the lungs every 6 (six) hours as needed for wheezing.   Yes Historical Provider, MD  HYDROcodone-acetaminophen (NORCO) 7.5-325 MG per tablet Take 1 tablet by mouth every 6 (six) hours as needed for moderate pain.   Yes Historical Provider, MD    Current Facility-Administered Medications  Medication Dose Route Frequency Provider Last Rate Last Dose  . 0.9 %  sodium chloride infusion   Intravenous Continuous Theodis Blaze, MD 100 mL/hr at  08/11/14 1701    . antiseptic oral rinse (CPC / CETYLPYRIDINIUM CHLORIDE 0.05%) solution 7 mL  7 mL Mouth Rinse q12n4p Theodis Blaze, MD   7 mL at 08/11/14 1200  . chlorhexidine (PERIDEX) 0.12 % solution 15 mL  15 mL Mouth Rinse BID Theodis Blaze, MD   15 mL at 08/11/14 0800  . enoxaparin (LOVENOX) injection 40 mg  40 mg Subcutaneous Q24H Theodis Blaze, MD      . feeding supplement (RESOURCE BREEZE) (RESOURCE BREEZE) liquid 1 Container  1 Container Oral BID BM Hazle Coca, RD   1 Container at 08/11/14 1400  . fluconazole (DIFLUCAN) tablet 100 mg  100 mg Oral Daily Theodis Blaze, MD   100 mg at 08/11/14 1211  . HYDROmorphone (DILAUDID) injection 1 mg  1 mg Intravenous Q2H PRN Theodis Blaze, MD   1 mg at 08/12/14 0731  . ondansetron (ZOFRAN)  tablet 4 mg  4 mg Oral Q6H PRN Theodis Blaze, MD       Or  . ondansetron [facility]) injection 4 mg  4 mg Intravenous Q6H PRN Theodis Blaze, MD   4 mg at 08/12/14 0000  . oxyCODONE (Oxy IR/ROXICODONE) immediate release tablet 5 mg  5 mg Oral Q4H PRN Theodis Blaze, MD      . pantoprazole (PROTONIX) injection 40 mg  40 mg Intravenous Q12H Theodis Blaze, MD   40 mg at 08/11/14 2114    Allergies as of 08/10/2014 - Review Complete 08/10/2014  Allergen Reaction Noted  . Bactrim Itching 11/05/2011  . Dapsone Itching 06/18/2013  . Orange fruit Itching 02/20/2012  . Peanut-containing drug products Hives 11/05/2011  . Shellfish allergy Hives 11/05/2011     Review of Systems:    All systems reviewed and negative except where noted in HPI.    Physical Exam:  Vital signs in last 24 hours: Temp:  [97.8 F (36.6 C)-98.3 F (36.8 C)] 98.3 F (36.8 C) (08/28 0550) Pulse Rate:  [62-74] 63 (08/28 0550) Resp:  [16-18] 16 (08/28 0550) BP: (120-144)/(70-81) 144/81 mmHg (08/28 0550) SpO2:  [100 %] 100 % (08/28 0550) Last BM Date: 08/09/14 General:   Pleasant black female in NAD Head:  Normocephalic and atraumatic. Eyes:   No icterus.   Conjunctiva pink. Ears:  Normal auditory acuity. Neck:  Supple; no masses felt Lungs:  Respirations even and unlabored. Lungs clear to auscultation bilaterally.   No wheezes, crackles, or rhonchi.  Heart:  Regular rate and rhythm;  murmur heard. Abdomen:  Soft, nondistended, nontender. Normal bowel sounds. No appreciable masses or hepatomegaly.  Rectal:  Not performed.  Msk:  Symmetrical without gross deformities. Significantly tender along right lower anterior rib cage. Extremities:  Without edema. Neurologic:  Alert and  oriented x4;  grossly normal neurologically. Skin:  Intact without significant lesions or rashes. Cervical Nodes:  No significant cervical adenopathy. Psych:  Alert and cooperative. Normal affect.  LAB RESULTS:  Recent Labs  08/11/14 0508  08/11/14 1300 08/12/14 0400  WBC 4.5 4.6 4.6  HGB 9.0* 8.9* 9.0*  HCT 28.1* 28.0* 27.2*  PLT 192 189 191   BMET  Recent Labs  08/10/14 0849 08/11/14 0508 08/12/14 0400  NA 135* 135* 135*  K 3.2* 3.3* 3.4*  CL 97 101 103  CO2 22 23 22   GLUCOSE 96 98 93  BUN 6 5* 3*  CREATININE 0.61 0.64 0.66  CALCIUM 9.4 8.1* 8.1*   LFT  Recent Labs  08/12/14 0400  PROT 6.0  ALBUMIN 2.6*  AST 39*  ALT 22  ALKPHOS 75  BILITOT 0.4    STUDIES: Ct Abdomen Pelvis W Contrast  08/10/2014   CLINICAL DATA:  Nausea for 3 weeks. RIGHT-sided rib CABG pain. Abdominal pain for 1 week. Headache.  pain  EXAM: CT ABDOMEN AND PELVIS WITH CONTRAST  TECHNIQUE: Multidetector CT imaging of the abdomen and pelvis was performed using the standard protocol following bolus administration of intravenous contrast.  CONTRAST:  122mL OMNIPAQUE IOHEXOL 300 MG/ML  SOLN  COMPARISON:  07/22/2014.  FINDINGS: Musculoskeletal: L5-S1 spondylosis with vacuum disc and shallow disc protrusion, partially calcified. There are no aggressive osseous lesions.  Lung Bases: Nodular atelectasis in the RIGHT lower lobe near the RIGHT costophrenic sulcus.  Liver: Normal. Focal fatty infiltration adjacent to the falciform ligament.  Spleen:  Normal.  Gallbladder:  Small amount of dependently layering biliary sludge.  Common bile duct:  Normal.  Pancreas:  Normal.  Adrenal glands:  Normal bilaterally.  Kidneys: Normal enhancement. Both ureters appear within normal limits.  Stomach:  Mostly collapsed.  No inflammatory changes.  Small bowel: Duodenum appears within normal limits. Proximal small bowel is normal. No mesenteric adenopathy. No bowel inflammatory changes. No obstruction.  Colon: Normal appendix. Ascending colon appears normal. Transverse and descending colon also within normal limits.  Pelvic Genitourinary: No free fluid. Urinary bladder collapsed. Fluid density incidentally noted in the vagina, probably related to menstrual cycle.  Uterus appears normal. Bilateral ovarian cysts.  Vasculature: Normal.  Body Wall: Normal.  IMPRESSION: No interval change or acute abnormality.   Electronically Signed   By: Dereck Ligas M.D.   On: 08/10/2014 11:03     PREVIOUS ENDOSCOPIES:            EGD July 2014 for dysphagia - large 5cm prox esoph ulcer.Bx = inflamation.  Gastric bx = minimally active gastritis, neg h.pylori   Impression / Plan:   60. 38 year old female with admitted with upper abdominal pain /nausea. Labs, ultrasound and CTscan nondiagnostic. She does have gb sludge but no findings to suggest cholecystitis. Patient points to RUQ but she is actually most tender over right lower rib cage raising suspicion that this is more musculoskeletal in nature though doesn't explain why pain also worse with eating. Will consider further gallbladder workup  2. HIV, CD4 in 10. Patient off HIV meds for a month which she states is because of swallowing problems. Looks like there have been medication adherence problems through the years.   3. Hx of esophageal ulcer July 2014. On daily PPI now  4. Microcytic anemia, likely multifactorial (menses, nutritional, chronic disease). Can't exclude low grade chronic GI blood loss from erosive disease. No overt GI bleeding.  5. Constipation. Last BM 3 or so days ago. She feels a little constipated with make sure Miralax is ordered.   Thanks   LOS: 2 days   Tye Savoy  08/12/2014, 8:57 AM    ________________________________________________________________________  Velora Heckler GI MD note:  I personally examined the patient, reviewed the data and agree with the assessment and plan described above.  Planning on EGD later today for RUQ, epigastric pain.     Owens Loffler, MD [facility] Pager [phone]

## 2014-08-12 NOTE — Clinical Documentation Improvement (Signed)
Possible Clinical Conditions?  AIDS HIV Disease HIV (+) unsymptomatic Other Condition Cannot Clinically Determine   Supporting Information: (As per notes) "HIV - last CD4# 10 in January 2015"   Component     Latest Ref Rng 10/18/2013 01/03/2014 08/11/2014  CD4 T Cell Abs     400 - 2700 /uL <10 (L) 10 (L) 10 (L)  CD4 % Helper T Cell     33 - 55 % <1 (L) 2 (L) 1 (L)    Thank You, Alessandra Grout, RN, BSN, CCDS,Clinical Documentation Specialist:  [phone]  [phone]=[facility]- Health Information Management

## 2014-08-13 LAB — CBC
HCT: 28 % — ABNORMAL LOW (ref 36.0–46.0)
HEMOGLOBIN: 9.1 g/dL — AB (ref 12.0–15.0)
MCH: 21.7 pg — AB (ref 26.0–34.0)
MCHC: 32.5 g/dL (ref 30.0–36.0)
MCV: 66.8 fL — ABNORMAL LOW (ref 78.0–100.0)
Platelets: 187 10*3/uL (ref 150–400)
RBC: 4.19 MIL/uL (ref 3.87–5.11)
RDW: 21.2 % — AB (ref 11.5–15.5)
WBC: 4.2 10*3/uL (ref 4.0–10.5)

## 2014-08-13 LAB — COMPREHENSIVE METABOLIC PANEL
ALBUMIN: 2.6 g/dL — AB (ref 3.5–5.2)
ALT: 24 U/L (ref 0–35)
ANION GAP: 12 (ref 5–15)
AST: 40 U/L — ABNORMAL HIGH (ref 0–37)
Alkaline Phosphatase: 85 U/L (ref 39–117)
BUN: 3 mg/dL — ABNORMAL LOW (ref 6–23)
CALCIUM: 8.3 mg/dL — AB (ref 8.4–10.5)
CO2: 23 mEq/L (ref 19–32)
Chloride: 103 mEq/L (ref 96–112)
Creatinine, Ser: 0.64 mg/dL (ref 0.50–1.10)
GFR calc non Af Amer: >90 mL/min (ref >90)
GLUCOSE: 99 mg/dL (ref 70–99)
Potassium: 3.9 mEq/L (ref 3.7–5.3)
Sodium: 138 mEq/L (ref 137–147)
Total Bilirubin: 0.4 mg/dL (ref 0.3–1.2)
Total Protein: 6 g/dL (ref 6.0–8.3)

## 2014-08-13 MED ORDER — BOOST / RESOURCE BREEZE PO LIQD
1.0000 | Freq: Two times a day (BID) | ORAL | Status: DC
  Administered 2014-08-17: 1 via ORAL

## 2014-08-13 MED ORDER — EMTRICITABINE-TENOFOVIR DF 200-300 MG PO TABS
1.0000 | ORAL_TABLET | Freq: Every day | ORAL | Status: DC
  Administered 2014-08-13 – 2014-08-20 (×8): 1 via ORAL
  Filled 2014-08-13 (×9): qty 1

## 2014-08-13 MED ORDER — DOLUTEGRAVIR SODIUM 50 MG PO TABS
50.0000 mg | ORAL_TABLET | Freq: Every day | ORAL | Status: DC
  Administered 2014-08-14 – 2014-08-18 (×6): 50 mg via ORAL
  Filled 2014-08-13 (×8): qty 1

## 2014-08-13 NOTE — Progress Notes (Addendum)
Patient ID: [name], female   DOB: [birth year], 38 y.o.   MRN: [id number]  [facility] PROGRESS NOTE  [name] FIE:[id number] DOB: 03/16/1976 DOA: 08/10/2014 PCP: Philis Fendt, MD  Brief narrative:  Pt is 38 yo female with HIV, Hep B, who presented to [facility] ED with main concern of one month duration of progressively worsening RUQ abdominal pain, throbbing and intermitted, 7/10 in severity when present, non radiating, with no specific urinary concerns, no fevers, chills, no sick contacts or exposures. Pt denies any blood in urine or stool. She has not seen her PCP for the problem.   In ED, pt noted to be hemodynamically stable but in mild distress due to pain. CBC notable for Hg ~ 3, ? Blood work error. 2 U PRBC ordered for transfusion. TRH asked to admit for further evaluation.   Assessment and Plan:   Active Problems:  Abdominal pain  - from ulcers noted on EGD in the setting of HIV  - Ct abd with no specific acute findings to explain the etiology  - continue Protonix and will also consult ID for recommendations on treatment Acute on chronic blood loss anemia  - transfused two unit of blood 8/26: Hg trend 3.6 --> 9, initial blood work likely an error  - pt denies any active bleeding, no blood in urine or stool  - her baseline Hg is ~8  - repeat CBC in AM  Elevated Lipase  - unclear if true pancreatitis  - pt is not taking any ART medications over one month  - lipase is WNL  Hypokalemia  - continue to supplement as indicated  Transaminitis  - possibly from Hep B  - LFT's trending down  HIV - last CD4# 10 in January 2015  - repeat CD4# 10  - pt was on ART but has not been compliant since January 2015  - will consult with ID today  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 No associated sonographic findings to suggest acute cholecystitis.  CT ABDOMEN AND PELVIS W/C 8/26 No interval change or acute abnormality.  EGD 8/28 by Dr. Ardis Hughs There were two large ulcers in the mid to distal esophagus (each 3-4cm). Neither appeared maligant. The most distal ulcer created incomplete stricture. Biopsies were taken from proximal ulcer edge and ulcer bed. The examination to the distal esophugus was otherwise normal. 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 Benson Norway. Restart HIV medications.  Medical Consultants:   GI ID Other Consultants:   None Anti-Infectives:   Fluconazole for oral thrush 8/27 -->  Faye Ramsay, MD  [facility] Pager [phone]  If 7PM-7AM, please contact night-coverage [email or web address] Password [email or web address] 08/13/2014, 9:59 AM   LOS: 3 days   HPI/Subjective: No events overnight.   Objective: Filed Vitals:   08/12/14 1407 08/12/14 1451 08/12/14 2119 08/13/14 0642  BP: 141/83 142/74 126/72 142/81  Pulse: 57 62 69 62  Temp:  98.5 F (36.9 C) 100 F (37.8 C) 99.3 F (37.4 C)  TempSrc:  Oral Oral Oral  Resp: 13 16 18 16   Height:  Weight:      SpO2: 100% 100% 100% 100%    Intake/Output Summary (Last 24 hours) at 08/13/14 0959 Last data filed at 08/13/14 7939  Gross per 24 hour  Intake 1833.33 ml  Output    400 ml  Net 1433.33 ml    Exam:   General:  Pt is alert, follows commands appropriately, not in acute distress  Cardiovascular: Regular rate and rhythm, S1/S2, no murmurs, no rubs, no gallops  Respiratory: Clear to auscultation bilaterally, no wheezing, no crackles, no rhonchi  Abdomen: Soft, non tender, non distended, bowel sounds present, no guarding  Data Reviewed: Basic Metabolic Panel:  Recent Labs Lab  08/10/14 0849 08/11/14 0508 08/12/14 0400 08/13/14 0632  NA 135* 135* 135* 138  K 3.2* 3.3* 3.4* 3.9  CL 97 101 103 103  CO2 22 23 22 23   GLUCOSE 96 98 93 99  BUN 6 5* 3* 3*  CREATININE 0.61 0.64 0.66 0.64  CALCIUM 9.4 8.1* 8.1* 8.3*   Liver Function Tests:  Recent Labs Lab 08/10/14 0849 08/11/14 0508 08/12/14 0400 08/13/14 0632  AST 52* 40* 39* 40*  ALT 32 23 22 24   ALKPHOS 86 71 75 85  BILITOT 0.4 0.5 0.4 0.4  PROT 7.5 6.1 6.0 6.0  ALBUMIN 3.4* 2.7* 2.6* 2.6*    Recent Labs Lab 08/10/14 0849 08/11/14 0508  LIPASE 62* 56   No results found for this basename: AMMONIA,  in the last 168 hours CBC:  Recent Labs Lab 08/10/14 0849 08/11/14 0508 08/11/14 1300 08/12/14 0400 08/13/14 0632  WBC 5.1 4.5 4.6 4.6 4.2  NEUTROABS 3.9  --   --   --   --   HGB 3.6* 9.0* 8.9* 9.0* 9.1*  HCT 11.7* 28.1* 28.0* 27.2* 28.0*  MCV 62.9* 67.5* 67.5* 66.5* 66.8*  PLT 325 192 189 191 187   Cardiac Enzymes: No results found for this basename: CKTOTAL, CKMB, CKMBINDEX, TROPONINI,  in the last 168 hours BNP: No components found with this basename: POCBNP,  CBG: No results found for this basename: GLUCAP,  in the last 168 hours  Recent Results (from the past 240 hour(s))  URINE CULTURE     Status: None   Collection Time    08/10/14  5:53 PM      Result Value Ref Range Status   Specimen Description URINE, RANDOM   Final   Special Requests NONE   Final   Culture  Setup Time     Final   Value: 08/10/2014 21:57     Performed at [place]     Final   Value: NO GROWTH     Performed at [facility]   Culture     Final   Value: NO GROWTH     Performed at [facility]   Report Status 08/11/2014 FINAL   Final     Scheduled Meds: . antiseptic oral rinse  7 mL Mouth Rinse q12n4p  . chlorhexidine  15 mL Mouth Rinse BID  . enoxaparin (LOVENOX) injection  40 mg Subcutaneous Q24H  . feeding supplement (RESOURCE BREEZE)  1 Container Oral  BID BM  . fluconazole  100 mg Oral Daily  . pantoprazole  40 mg Oral Daily  . polyethylene glycol  17 g Oral Daily   Continuous Infusions: . sodium chloride 50 mL/hr at 08/12/14 1641

## 2014-08-13 NOTE — Consult Note (Signed)
[facility]    Date of Admission:  08/10/2014  Date of Consult:  08/13/2014  Reason for Consult:  HIV/AIDS, noncompliant, with esophageal ulcers Referring Physician: Dr. Donnella Bi   HPI: [name] is an 38 y.o. female with HIV, AIDS with history of very poor compliance with uncontrolled viremia for more than 10 years and CD4 that has been largely <10 to 10 for nearly same time period. She was found in July 2014 to have ulcers on EGD with path sent that did not show HSV or CMV and ulcers thought due to her HIV and IC state. She has been readmitted with similar symptoms to July of 2014 and has now repeat EGD from Dr Ardis Hughs again showing ulcers in esophagus.  I had extensive conversation with the patient regarding the reasons for her noncompliance. She relates some of the to forgetting to take meds if she does not take them in the am. She also suffers from fair amount of nausea from her ARVs and her PCP prophylaxis.   She has problems with persistent dysphagia and pills getting stuck in her esophagus sometimes  even when she crushes them.   I reviewed some other options for her and would like to proceed with TIvicay and Truvada for treatment and try daspone for PCP prophylaxis.     Past Medical History  Diagnosis Date  . HIV positive   . Hepatitis B     /E-chart  . Microcytic anemia     h/o per E-chart  . 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 Noncompliance with medication regimen     /e-chart  . 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 Pyelonephritis     h/o per E-chart  . Thyroid disease   . Depression   . Asthma     inhaler 2xday    Past Surgical History  Procedure Laterality Date  . Thyroid surgery    . Thyroid lobectomy  08/2002    left & isthmectomy; for benign thyroid adenoma/E-chart  . Tubal ligation  07/2002    /E-chart  . Esophagogastroduodenoscopy N/A  07/13/2013    Procedure: ESOPHAGOGASTRODUODENOSCOPY (EGD);  Surgeon: Beryle Beams, MD;  Location: [facility];  Service: Endoscopy;  Laterality: N/A;  ergies:   Allergies  Allergen Reactions  . Bactrim Itching  . Dapsone Itching  . Orange Fruit Itching  . Peanut-Containing Drug Products Hives  . Shellfish Allergy Hives     Medications: I have reviewed patients current medications as documented in Epic Anti-infectives   Start     Dose/Rate Route Frequency Ordered Stop   08/13/14 2330  dolutegravir (TIVICAY) tablet 50 mg     50 mg Oral Daily 08/13/14 1828     08/13/14 2000  emtricitabine-tenofovir (TRUVADA) 200-300 MG per tablet 1 tablet     1 tablet Oral Daily 08/13/14 1828     08/11/14 0900  fluconazole (DIFLUCAN) tablet 100 mg     100 mg Oral Daily 08/11/14 0852        Social History:  reports that she has never smoked. She has never used smokeless tobacco. She reports that she drinks alcohol. She reports that she does not use illicit drugs.  Family History  Problem Relation Age of Onset  . Cancer Mother   . Diabetes Mother   . Diabetes Father   . Heart disease Father   . Cancer Sister   .  Diabetes Sister   . Asthma Son     As in HPI and primary teams notes otherwise 12 point review of systems is negative  Blood pressure 136/65, pulse 63, temperature 99.3 F (37.4 C), temperature source Oral, resp. rate 14, height 6' (1.829 m), weight 162 lb 11.2 oz (73.8 kg), last menstrual period 05/21/2014, SpO2 100.00%. General: Alert and awake, oriented x3, not in any acute distress. HEENT: anicteric sclera, pupils reactive to light and accommodation, EOMI, oropharynx clear and without exudate CVS regular rate, normal r,  no murmur rubs or gallops Chest: clear to auscultation bilaterally, no wheezing, rales or rhonchi Abdomen: soft nontender, nondistended, normal bowel sounds, Extremities: no  clubbing or edema noted bilaterally Skin: no rashes Neuro: nonfocal, strength and  sensation intact   Results for orders placed during the hospital encounter of 08/10/14 (from the past 48 hour(s))  CBC     Status: Abnormal   Collection Time    08/12/14  4:00 AM      Result Value Ref Range   WBC 4.6  4.0 - 10.5 K/uL   RBC 4.09  3.87 - 5.11 MIL/uL   Hemoglobin 9.0 (*) 12.0 - 15.0 g/dL   HCT 27.2 (*) 36.0 - 46.0 %   MCV 66.5 (*) 78.0 - 100.0 fL   MCH 22.0 (*) 26.0 - 34.0 pg   MCHC 33.1  30.0 - 36.0 g/dL   RDW 20.9 (*) 11.5 - 15.5 %   Platelets 191  150 - 400 K/uL  COMPREHENSIVE METABOLIC PANEL     Status: Abnormal   Collection Time    08/12/14  4:00 AM      Result Value Ref Range   Sodium 135 (*) 137 - 147 mEq/L   Potassium 3.4 (*) 3.7 - 5.3 mEq/L   Chloride 103  96 - 112 mEq/L   CO2 22  19 - 32 mEq/L   Glucose, Bld 93  70 - 99 mg/dL   BUN 3 (*) 6 - 23 mg/dL   Creatinine, Ser 0.66  0.50 - 1.10 mg/dL   Calcium 8.1 (*) 8.4 - 10.5 mg/dL   Total Protein 6.0  6.0 - 8.3 g/dL   Albumin 2.6 (*) 3.5 - 5.2 g/dL   AST 39 (*) 0 - 37 U/L   ALT 22  0 - 35 U/L   Alkaline Phosphatase 75  39 - 117 U/L   Total Bilirubin 0.4  0.3 - 1.2 mg/dL   GFR calc non Af Amer >90  >90 mL/min   GFR calc Af Amer >90  >90 mL/min   Comment: (NOTE)     The eGFR has been calculated using the CKD EPI equation.     This calculation has not been validated in all clinical situations.     eGFR's persistently <90 mL/min signify possible Chronic Kidney     Disease.   Anion gap 10  5 - 15  CBC     Status: Abnormal   Collection Time    08/13/14  6:32 AM      Result Value Ref Range   WBC 4.2  4.0 - 10.5 K/uL   RBC 4.19  3.87 - 5.11 MIL/uL   Hemoglobin 9.1 (*) 12.0 - 15.0 g/dL   HCT 28.0 (*) 36.0 - 46.0 %   MCV 66.8 (*) 78.0 - 100.0 fL   MCH 21.7 (*) 26.0 - 34.0 pg   MCHC 32.5  30.0 - 36.0 g/dL   RDW 21.2 (*) 11.5 - 15.5 %  Platelets 187  150 - 400 K/uL  COMPREHENSIVE METABOLIC PANEL     Status: Abnormal   Collection Time    08/13/14  6:32 AM      Result Value Ref Range   Sodium 138  137  - 147 mEq/L   Potassium 3.9  3.7 - 5.3 mEq/L   Chloride 103  96 - 112 mEq/L   CO2 23  19 - 32 mEq/L   Glucose, Bld 99  70 - 99 mg/dL   BUN 3 (*) 6 - 23 mg/dL   Creatinine, Ser 0.64  0.50 - 1.10 mg/dL   Calcium 8.3 (*) 8.4 - 10.5 mg/dL   Total Protein 6.0  6.0 - 8.3 g/dL   Albumin 2.6 (*) 3.5 - 5.2 g/dL   AST 40 (*) 0 - 37 U/L   ALT 24  0 - 35 U/L   Alkaline Phosphatase 85  39 - 117 U/L   Total Bilirubin 0.4  0.3 - 1.2 mg/dL   GFR calc non Af Amer >90  >90 mL/min   GFR calc Af Amer >90  >90 mL/min   Comment: (NOTE)     The eGFR has been calculated using the CKD EPI equation.     This calculation has not been validated in all clinical situations.     eGFR's persistently <90 mL/min signify possible Chronic Kidney     Disease.   Anion gap 12  5 - 15   @BRIEFLABTABLE (sdes,specrequest,cult,reptstatus)   ) Recent Results (from the past 720 hour(s))  URINE CULTURE     Status: None   Collection Time    07/22/14  2:43 AM      Result Value Ref Range Status   Specimen Description URINE, CLEAN CATCH   Final   Special Requests NONE   Final   Culture  Setup Time     Final   Value: 07/22/2014 09:28     Performed at [place]     Final   Value: >=100,000 COLONIES/ML     Performed at [facility]   Culture     Final   Value: ESCHERICHIA COLI     Performed at [facility]   Report Status 07/24/2014 FINAL   Final   Organism ID, Bacteria ESCHERICHIA COLI   Final  URINE CULTURE     Status: None   Collection Time    08/10/14  5:53 PM      Result Value Ref Range Status   Specimen Description URINE, RANDOM   Final   Special Requests NONE   Final   Culture  Setup Time     Final   Value: 08/10/2014 21:57     Performed at [place]     Final   Value: NO GROWTH     Performed at [facility]   Culture     Final   Value: NO GROWTH     Performed at [facility]   Report Status 08/11/2014 FINAL   Final      Impression/Recommendation  Active Problems:   Abdominal pain, acute   [name] is a 38 y.o. female with  HIV, AIDS noncompliance, esophageal ulcers sp repeat EGD by Dr. Ardis Hughs.  #1 HIV/AIDs:  --will start Tivicay and Truvada --check HLA b701 in case wish to go to [facility] though pill size matters to her (this tablet can also be crushed)  --check g6pd level and will start dapsone  tomorrow --will need weekly azithromycin 1223m   --SHE WILL NEED EMERGENT, URGENT ADAP APPLICATION AND I WILL ASK [facility]IDGE COUNSELOR TO HELP GET HER ENROLLED SO HOPEFULLY WE CAN HAVE [place] # 2 Esophageal ulcers: await pathology. I am not yet ready to pull trigger on Acyclovir or Gancyclovir for HSV or CMV  #3 Goals of care: had extensive conversation re the nature of her condition and the need to turn things around quickly or she will face certain demise fairly soon. She has son JEdison Nasutiwho is unaware of her diagnosis . She wants very much to be around to be with him  I spent greater than 40 minutes with the patient including greater than 50% of time in face to face counsel of the patient and in coordination of their care.-  08/13/2014, 7:28 PM   Thank you so much for this interesting consult  RDaltonfor [phone](pager) [phone](office) 08/13/2014, 7:28 PM  CRhina BrackettDam 08/13/2014, 7:28 PM

## 2014-08-14 MED ORDER — SENNOSIDES-DOCUSATE SODIUM 8.6-50 MG PO TABS
1.0000 | ORAL_TABLET | Freq: Two times a day (BID) | ORAL | Status: DC
  Administered 2014-08-14 – 2014-08-16 (×4): 1 via ORAL
  Filled 2014-08-14 (×7): qty 1

## 2014-08-14 MED ORDER — DEXTROSE 5 % IV SOLN
5.0000 mg/kg | Freq: Three times a day (TID) | INTRAVENOUS | Status: DC
  Administered 2014-08-14 – 2014-08-18 (×12): 370 mg via INTRAVENOUS
  Filled 2014-08-14 (×15): qty 7.4

## 2014-08-14 MED ORDER — SODIUM CHLORIDE 0.9 % IV SOLN
INTRAVENOUS | Status: AC
  Administered 2014-08-14: 21:00:00 via INTRAVENOUS
  Administered 2014-08-15: 500 mL via INTRAVENOUS

## 2014-08-14 NOTE — Progress Notes (Signed)
Visitors at bedside. Pt ambulating to the bathroom,tolerates well.

## 2014-08-14 NOTE — Progress Notes (Signed)
Patient ID: [name], female   DOB: 02/02/1976, 38 y.o.   MRN: [id number]  [facility] PROGRESS NOTE  ESTEPHANIE HUBBS TZG:[id number] DOB: [birth year] DOA: 08/10/2014 PCP: Philis Fendt, MD  Brief narrative:  Pt is 38 yo female with HIV, Hep B, who presented to [facility] ED with main concern of one month duration of progressively worsening RUQ abdominal pain, throbbing and intermitted, 7/10 in severity when present, non radiating, with no specific urinary concerns, no fevers, chills, no sick contacts or exposures. Pt denies any blood in urine or stool. She has not seen her PCP for the problem.   In ED, pt noted to be hemodynamically stable but in mild distress due to pain. CBC notable for Hg ~ 3, ? Blood work error. 2 U PRBC ordered for transfusion. TRH asked to admit for further evaluation.   Assessment and Plan:   Active Problems:  Abdominal pain  - from ulcers noted on EGD in the setting of HIV  - Ct abd with no specific acute findings - continue Protonix - appreciate ID input, pt started on ART Acute on chronic blood loss anemia  - transfused two unit of blood 8/26: Hg trend 3.6 --> 9, initial blood work likely an error  - pt denies any active bleeding, no blood in urine or stool  - her baseline Hg is ~8  Elevated Lipase  - unclear if true pancreatitis  - pt is not taking any ART medications over one month  - lipase is WNL  Hypokalemia  - supplemented and WNL  Transaminitis  - possibly from Hep B  - LFT's trending down  HIV  - last CD4# 10 in January 2015  - repeat CD4# 10  - pt was on ART but has not been compliant since January 2015  - started on ART by ID, appreciate input  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 No associated sonographic findings to suggest acute cholecystitis.  CT ABDOMEN AND PELVIS W/C 8/26 No interval change or acute abnormality.  EGD 8/28 by Dr. Ardis Hughs There were two large ulcers in the mid to distal esophagus (each 3-4cm). Neither appeared maligant. The most distal ulcer created incomplete stricture. Biopsies were taken from proximal ulcer edge and ulcer bed. The examination to the distal esophugus was otherwise normal. 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 Benson Norway. Restart HIV medications.  Medical Consultants:   GI  ID Other Consultants:   None Anti-Infectives:   Fluconazole for oral thrush   Faye Ramsay, MD  [facility] Pager [phone]  If 7PM-7AM, please contact night-coverage [email or web address] Password [email or web address] 08/14/2014, 2:09 PM   LOS: 4 days   HPI/Subjective: No events overnight.   Objective: Filed Vitals:   08/13/14 0642 08/13/14 1442 08/13/14 2035 08/14/14 0452  BP: 142/81 136/65 141/73 148/65  Pulse: 62 63 65 51  Temp: 99.3 F (37.4 C) 99.3 F (37.4 C) 98.6 F (37 C) 98.3 F (36.8 C)  TempSrc: Oral Oral Oral Oral  Resp: 16 14 14 16   Height:      Weight:  SpO2: 100% 100% 99% 100%    Intake/Output Summary (Last 24 hours) at 08/14/14 1409 Last data filed at 08/14/14 0900  Gross per 24 hour  Intake   1440 ml  Output    300 ml  Net   1140 ml    Exam:   General:  Pt is alert, follows commands appropriately, not in acute distress  Cardiovascular: Regular rate and rhythm, S1/S2, no murmurs, no rubs, no gallops  Respiratory: Clear to auscultation bilaterally, no wheezing, no crackles, no rhonchi  Abdomen: Soft, non tender, non distended, bowel sounds present, no guarding  Data Reviewed: Basic Metabolic Panel:  Recent Labs Lab 08/10/14 0849 08/11/14 0508 08/12/14 0400  08/13/14 0632  NA 135* 135* 135* 138  K 3.2* 3.3* 3.4* 3.9  CL 97 101 103 103  CO2 22 23 22 23   GLUCOSE 96 98 93 99  BUN 6 5* 3* 3*  CREATININE 0.61 0.64 0.66 0.64  CALCIUM 9.4 8.1* 8.1* 8.3*   Liver Function Tests:  Recent Labs Lab 08/10/14 0849 08/11/14 0508 08/12/14 0400 08/13/14 0632  AST 52* 40* 39* 40*  ALT 32 23 22 24   ALKPHOS 86 71 75 85  BILITOT 0.4 0.5 0.4 0.4  PROT 7.5 6.1 6.0 6.0  ALBUMIN 3.4* 2.7* 2.6* 2.6*    Recent Labs Lab 08/10/14 0849 08/11/14 0508  LIPASE 62* 56   No results found for this basename: AMMONIA,  in the last 168 hours CBC:  Recent Labs Lab 08/10/14 0849 08/11/14 0508 08/11/14 1300 08/12/14 0400 08/13/14 0632  WBC 5.1 4.5 4.6 4.6 4.2  NEUTROABS 3.9  --   --   --   --   HGB 3.6* 9.0* 8.9* 9.0* 9.1*  HCT 11.7* 28.1* 28.0* 27.2* 28.0*  MCV 62.9* 67.5* 67.5* 66.5* 66.8*  PLT 325 192 189 191 187     Recent Results (from the past 240 hour(s))  URINE CULTURE     Status: None   Collection Time    08/10/14  5:53 PM      Result Value Ref Range Status   Specimen Description URINE, RANDOM   Final   Special Requests NONE   Final   Culture  Setup Time     Final   Value: 08/10/2014 21:57     Performed at [facility]     Final   Value: NO GROWTH     Performed at [facility]   Culture     Final   Value: NO GROWTH     Performed at [facility]   Report Status 08/11/2014 FINAL   Final  CULTURE, BLOOD (ROUTINE X 2)     Status: None   Collection Time    08/12/14 10:15 PM      Result Value Ref Range Status   Specimen Description BLOOD R ARM   Final   Special Requests BOTTLES DRAWN AEROBIC ONLY 10CC   Final   Culture  Setup Time     Final   Value: 08/13/2014 01:00     Performed at [facility]   Culture     Final   Value:        BLOOD CULTURE RECEIVED NO GROWTH TO DATE CULTURE WILL BE HELD FOR 5 DAYS BEFORE ISSUING A FINAL NEGATIVE REPORT     Performed at [facility]    Report Status PENDING   Incomplete  CULTURE, BLOOD (ROUTINE X 2)     Status: None  Collection Time    08/12/14 10:15 PM      Result Value Ref Range Status   Specimen Description BLOOD R HAND   Final   Special Requests BOTTLES DRAWN AEROBIC ONLY 5ML   Final   Culture  Setup Time     Final   Value: 08/13/2014 01:00     Performed at [facility]   Culture     Final   Value:        BLOOD CULTURE RECEIVED NO GROWTH TO DATE CULTURE WILL BE HELD FOR 5 DAYS BEFORE ISSUING A FINAL NEGATIVE REPORT     Performed at [facility]   Report Status PENDING   Incomplete     Scheduled Meds: . antiseptic oral rinse  7 mL Mouth Rinse q12n4p  . chlorhexidine  15 mL Mouth Rinse BID  . dolutegravir  50 mg Oral Daily  . emtricitabine-tenofovir  1 tablet Oral Daily  . enoxaparin (LOVENOX) injection  40 mg Subcutaneous Q24H  . feeding supplement (RESOURCE BREEZE)  1 Container Oral BID BM  . fluconazole  100 mg Oral Daily  . pantoprazole  40 mg Oral Daily  . polyethylene glycol  17 g Oral Daily  . senna-docusate  1 tablet Oral BID   Continuous Infusions: . sodium chloride 50 mL/hr at 08/14/14 0645

## 2014-08-14 NOTE — Progress Notes (Addendum)
ANTIBIOTIC CONSULT NOTE - INITIAL  Pharmacy Consult for Acyclovir Indication: esophageal HSV ulcerations  Allergies  Allergen Reactions  . Bactrim Itching  . Dapsone Itching  . Orange Fruit Itching  . Peanut-Containing Drug Products Hives  . Shellfish Allergy Hives    Patient Measurements: Height: 6' (182.9 cm) Weight: 162 lb 11.2 oz (73.8 kg) IBW/kg (Calculated) : 73.1  Vital Signs: Temp: 98.3 F (36.8 C) (08/30 1445) Temp src: Oral (08/30 1445) BP: 148/84 mmHg (08/30 1445) Pulse Rate: 64 (08/30 1445) Intake/Output from previous day: 08/29 0701 - 08/30 0700 In: 1440 [P.O.:240; I.V.:1200] Out: -  Intake/Output from this shift: Total I/O In: 600 [P.O.:600] Out: 300 [Urine:300]  Labs:  Recent Labs  08/12/14 0400 08/13/14 0632  WBC 4.6 4.2  HGB 9.0* 9.1*  PLT 191 187  CREATININE 0.66 0.64   Estimated Creatinine Clearance: 111.1 ml/min (by C-G formula based on Cr of 0.64). No results found for this basename: VANCOTROUGH, Corlis Leak, VANCORANDOM, [place], [place], [place], [place], TOBRAPEAK, TOBRARND, AMIKACINPEAK, AMIKACINTROU, AMIKACIN,  in the last 72 hours   Microbiology: Recent Results (from the past 720 hour(s))  URINE CULTURE     Status: None   Collection Time    07/22/14  2:43 AM      Result Value Ref Range Status   Specimen Description URINE, CLEAN CATCH   Final   Special Requests NONE   Final   Culture  Setup Time     Final   Value: 07/22/2014 09:28     Performed at [place]     Final   Value: >=100,000 COLONIES/ML     Performed at [facility]   Culture     Final   Value: ESCHERICHIA COLI     Performed at [facility]   Report Status 07/24/2014 FINAL   Final   Organism ID, Bacteria ESCHERICHIA COLI   Final  URINE CULTURE     Status: None   Collection Time    08/10/14  5:53 PM      Result Value Ref Range Status   Specimen Description URINE, RANDOM   Final   Special Requests NONE   Final    Culture  Setup Time     Final   Value: 08/10/2014 21:57     Performed at [place]     Final   Value: NO GROWTH     Performed at [facility]   Culture     Final   Value: NO GROWTH     Performed at [facility]   Report Status 08/11/2014 FINAL   Final  CULTURE, BLOOD (ROUTINE X 2)     Status: None   Collection Time    08/12/14 10:15 PM      Result Value Ref Range Status   Specimen Description BLOOD R ARM   Final   Special Requests BOTTLES DRAWN AEROBIC ONLY 10CC   Final   Culture  Setup Time     Final   Value: 08/13/2014 01:00     Performed at [facility]   Culture     Final   Value:        BLOOD CULTURE RECEIVED NO GROWTH TO DATE CULTURE WILL BE HELD FOR 5 DAYS BEFORE ISSUING A FINAL NEGATIVE REPORT     Performed at [facility]   Report Status PENDING   Incomplete  CULTURE, BLOOD (ROUTINE X 2)     Status:  None   Collection Time    08/12/14 10:15 PM      Result Value Ref Range Status   Specimen Description BLOOD R HAND   Final   Special Requests BOTTLES DRAWN AEROBIC ONLY 5ML   Final   Culture  Setup Time     Final   Value: 08/13/2014 01:00     Performed at [facility]   Culture     Final   Value:        BLOOD CULTURE RECEIVED NO GROWTH TO DATE CULTURE WILL BE HELD FOR 5 DAYS BEFORE ISSUING A FINAL NEGATIVE REPORT     Performed at [facility]   Report Status PENDING   Incomplete    Medical History: Past Medical History  Diagnosis Date  . HIV positive   . Hepatitis B     /E-chart  . Microcytic anemia     h/o per E-chart  . 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 Noncompliance with medication regimen     /e-chart  . 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 Pyelonephritis     h/o per E-chart  . Thyroid disease   . Depression   . Asthma     inhaler 2xday    Medications:  Scheduled:  . antiseptic oral rinse  7  mL Mouth Rinse q12n4p  . chlorhexidine  15 mL Mouth Rinse BID  . dolutegravir  50 mg Oral Daily  . emtricitabine-tenofovir  1 tablet Oral Daily  . enoxaparin (LOVENOX) injection  40 mg Subcutaneous Q24H  . feeding supplement (RESOURCE BREEZE)  1 Container Oral BID BM  . fluconazole  100 mg Oral Daily  . pantoprazole  40 mg Oral Daily  . polyethylene glycol  17 g Oral Daily  . senna-docusate  1 tablet Oral BID   Anti-infectives   Start     Dose/Rate Route Frequency Ordered Stop   08/13/14 2330  dolutegravir (TIVICAY) tablet 50 mg     50 mg Oral Daily 08/13/14 1828     08/13/14 2000  emtricitabine-tenofovir (TRUVADA) 200-300 MG per tablet 1 tablet     1 tablet Oral Daily 08/13/14 1828     08/11/14 0900  fluconazole (DIFLUCAN) tablet 100 mg     100 mg Oral Daily 08/11/14 0852       Assessment: 38yo HIV+ female admitted after worsening RUQ abd pain x1 month.  Noted to have esophageal ulcerations on EGD.  Hx of noncompliance with initial PI-based regimen and consistently low CD4 count.  Restarted in hospital on Truvada/Tivicay.  Also to start azithro and dapsone for OI prophylaxis.  Ulcer pathology previously negative for HSV/CMV in July 2015.  Pathology results pending, but ID would like to start acyclovir now.  Afebrile WBC wnl (ANC 3.9) CD4 count 10/mcL SCr 0.6; CrCl > 90 ml/min  Goal of Therapy:  Eradication of infection  Plan:  Per [place] (Drug Safety, 2000, 23(2):131-42), dosing for mucocutaneous HSV in immunocompromised patients with intact renal function is 5 mg/kg q8h x7d  Start acyclovir 370 mg IV q8h  Monitor renal function, clinical course  Reuel Boom, PharmD [phone] 08/14/2014 4:06 PM

## 2014-08-14 NOTE — Progress Notes (Signed)
  [facility]    Subjective: Still c/o epigastric pain, tolerated the tivicay and truvada yesterday   Antibiotics:  Anti-infectives   Start     Dose/Rate Route Frequency Ordered Stop   08/14/14 1630  acyclovir (ZOVIRAX) 370 mg in dextrose 5 % 100 mL IVPB     5 mg/kg  73.8 kg 107.4 mL/hr over 60 Minutes Intravenous 3 times per day 08/14/14 1612 08/21/14 1359   08/13/14 2330  dolutegravir (TIVICAY) tablet 50 mg     50 mg Oral Daily 08/13/14 1828     08/13/14 2000  emtricitabine-tenofovir (TRUVADA) 200-300 MG per tablet 1 tablet     1 tablet Oral Daily 08/13/14 1828     08/11/14 0900  fluconazole (DIFLUCAN) tablet 100 mg     100 mg Oral Daily 08/11/14 0852        Medications: Scheduled Meds: . acyclovir  5 mg/kg Intravenous 3 times per day  . antiseptic oral rinse  7 mL Mouth Rinse q12n4p  . chlorhexidine  15 mL Mouth Rinse BID  . dolutegravir  50 mg Oral Daily  . emtricitabine-tenofovir  1 tablet Oral Daily  . enoxaparin (LOVENOX) injection  40 mg Subcutaneous Q24H  . feeding supplement (RESOURCE BREEZE)  1 Container Oral BID BM  . fluconazole  100 mg Oral Daily  . pantoprazole  40 mg Oral Daily  . polyethylene glycol  17 g Oral Daily  . senna-docusate  1 tablet Oral BID   Continuous Infusions:  PRN Meds:.gi cocktail, HYDROmorphone (DILAUDID) injection, ondansetron (ZOFRAN) IV, ondansetron, oxyCODONE    Objective: Weight change:   Intake/Output Summary (Last 24 hours) at 08/14/14 1616 Last data filed at 08/14/14 1230  Gross per 24 hour  Intake   1800 ml  Output    300 ml  Net   1500 ml   Blood pressure 148/84, pulse 64, temperature 98.3 F (36.8 C), temperature source Oral, resp. rate 16, height 6' (1.829 m), weight 162 lb 11.2 oz (73.8 kg), last menstrual period 05/21/2014, SpO2 100.00%. Temp:  [98.3 F (36.8 C)-98.6 F (37 C)] 98.3 F (36.8 C) (08/30 1445) Pulse Rate:  [51-65] 64 (08/30 1445) Resp:  [14-16] 16 (08/30 1445) BP:  (141-148)/(65-84) 148/84 mmHg (08/30 1445) SpO2:  [99 %-100 %] 100 % (08/30 1445)  Physical Exam: General: Alert and awake, oriented x3, not in any acute distress.  HEENT: anicteric sclera, pupils reactive to light and accommodation, EOMI, oropharynx clear and without exudate  CVS regular rate, normal r, no murmur rubs or gallops  Chest: clear to auscultation bilaterally, no wheezing, rales or rhonchi  Abdomen: soft nontender, nondistended, normal bowel sounds,  Extremities: no clubbing or edema noted bilaterally  Skin: no rashes  Neuro: nonfocal, strength and sensation intact   CBC: CBC Latest Ref Rng 08/13/2014 08/12/2014 08/11/2014  WBC 4.0 - 10.5 K/uL 4.2 4.6 4.6  Hemoglobin 12.0 - 15.0 g/dL 9.1(L) 9.0(L) 8.9(L)  Hematocrit 36.0 - 46.0 % 28.0(L) 27.2(L) 28.0(L)  Platelets 150 - 400 K/uL 187 191 189      BMET  Recent Labs  08/12/14 0400 08/13/14 0632  NA 135* 138  K 3.4* 3.9  CL 103 103  CO2 22 23  GLUCOSE 93 99  BUN 3* 3*  CREATININE 0.66 0.64  CALCIUM 8.1* 8.3*     Liver Panel   Recent Labs  08/12/14 0400 08/13/14 0632  PROT 6.0 6.0  ALBUMIN 2.6* 2.6*  AST 39* 40*  ALT 22 24  ALKPHOS   75 85  BILITOT 0.4 0.4       Sedimentation Rate No results found for this basename: ESRSEDRATE,  in the last 72 hours C-Reactive Protein No results found for this basename: CRP,  in the last 72 hours  Micro Results: Recent Results (from the past 720 hour(s))  URINE CULTURE     Status: None   Collection Time    07/22/14  2:43 AM      Result Value Ref Range Status   Specimen Description URINE, CLEAN CATCH   Final   Special Requests NONE   Final   Culture  Setup Time     Final   Value: 07/22/2014 09:28     Performed at [place]     Final   Value: >=100,000 COLONIES/ML     Performed at [facility]   Culture     Final   Value: ESCHERICHIA COLI     Performed at [facility]   Report Status 07/24/2014 FINAL   Final    Organism ID, Bacteria ESCHERICHIA COLI   Final  URINE CULTURE     Status: None   Collection Time    08/10/14  5:53 PM      Result Value Ref Range Status   Specimen Description URINE, RANDOM   Final   Special Requests NONE   Final   Culture  Setup Time     Final   Value: 08/10/2014 21:57     Performed at [facility]     Final   Value: NO GROWTH     Performed at [facility]   Culture     Final   Value: NO GROWTH     Performed at [facility]   Report Status 08/11/2014 FINAL   Final  CULTURE, BLOOD (ROUTINE X 2)     Status: None   Collection Time    08/12/14 10:15 PM      Result Value Ref Range Status   Specimen Description BLOOD R ARM   Final   Special Requests BOTTLES DRAWN AEROBIC ONLY 10CC   Final   Culture  Setup Time     Final   Value: 08/13/2014 01:00     Performed at [facility]   Culture     Final   Value:        BLOOD CULTURE RECEIVED NO GROWTH TO DATE CULTURE WILL BE HELD FOR 5 DAYS BEFORE ISSUING A FINAL NEGATIVE REPORT     Performed at [facility]   Report Status PENDING   Incomplete  CULTURE, BLOOD (ROUTINE X 2)     Status: None   Collection Time    08/12/14 10:15 PM      Result Value Ref Range Status   Specimen Description BLOOD R HAND   Final   Special Requests BOTTLES DRAWN AEROBIC ONLY 5ML   Final   Culture  Setup Time     Final   Value: 08/13/2014 01:00     Performed at [facility]   Culture     Final   Value:        BLOOD CULTURE RECEIVED NO GROWTH TO DATE CULTURE WILL BE HELD FOR 5 DAYS BEFORE ISSUING A FINAL NEGATIVE REPORT     Performed at [facility]   Report Status PENDING   Incomplete    Studies/Results: No results found.    Assessment/Plan:  Active Problems:  Abdominal pain, acute    [name] is a 38 y.o. female with  with HIV, AIDS noncompliance, esophageal ulcers sp repeat EGD by Dr. Jacobs.   #1 HIV/AIDs:  --started Tivicay and Truvada and she is  tolerating these fine -- --check HLA b701 in case wish to go to [facility] though pill size matters to her (this tablet can also be crushed)  --check g6pd level and will start dapsone today  --will need weekly azithromycin 1200mg   --SHE WILL NEED EMERGENT, URGENT ADAP APPLICATION AND I WILL ASK [facility] COUNSELOR TO HELP GET HER ENROLLED SO HOPEFULLY WE CAN HAVE HER MEDS READY FOR HER ON DC FROM THE HOSPITAL   # 2 Esophageal ulcers: will start IV acyclovir and followup pathology report, continue fluconazole  .-       LOS: 4 days    [place] 08/14/2014, 4:16 PM   

## 2014-08-15 ENCOUNTER — Encounter (HOSPITAL_COMMUNITY): Payer: Self-pay | Admitting: Gastroenterology

## 2014-08-15 DIAGNOSIS — B20 Human immunodeficiency virus [HIV] disease: Principal | ICD-10-CM

## 2014-08-15 DIAGNOSIS — B180 Chronic viral hepatitis B with delta-agent: Secondary | ICD-10-CM

## 2014-08-15 DIAGNOSIS — Z9119 Patient's noncompliance with other medical treatment and regimen: Secondary | ICD-10-CM

## 2014-08-15 DIAGNOSIS — R748 Abnormal levels of other serum enzymes: Secondary | ICD-10-CM

## 2014-08-15 DIAGNOSIS — Z91199 Patient's noncompliance with other medical treatment and regimen due to unspecified reason: Secondary | ICD-10-CM

## 2014-08-15 LAB — COMPREHENSIVE METABOLIC PANEL
ALBUMIN: 2.6 g/dL — AB (ref 3.5–5.2)
ALK PHOS: 91 U/L (ref 39–117)
ALT: 40 U/L — AB (ref 0–35)
AST: 90 U/L — AB (ref 0–37)
Anion gap: 12 (ref 5–15)
BUN: <3 mg/dL — ABNORMAL LOW (ref 6–23)
CO2: 23 mEq/L (ref 19–32)
Calcium: 8.6 mg/dL (ref 8.4–10.5)
Chloride: 101 mEq/L (ref 96–112)
Creatinine, Ser: 0.58 mg/dL (ref 0.50–1.10)
GFR calc Af Amer: >90 mL/min (ref >90)
GFR calc non Af Amer: >90 mL/min (ref >90)
Glucose, Bld: 115 mg/dL — ABNORMAL HIGH (ref 70–99)
POTASSIUM: 3.7 meq/L (ref 3.7–5.3)
SODIUM: 136 meq/L — AB (ref 137–147)
Total Bilirubin: 0.3 mg/dL (ref 0.3–1.2)
Total Protein: 6 g/dL (ref 6.0–8.3)

## 2014-08-15 LAB — GLUCOSE 6 PHOSPHATE DEHYDROGENASE: G6PDH: 23 U/g{Hb} — AB (ref 7.0–20.5)

## 2014-08-15 MED ORDER — LACTULOSE 10 GM/15ML PO SOLN
30.0000 g | ORAL | Status: AC
  Filled 2014-08-15 (×2): qty 45

## 2014-08-15 NOTE — Progress Notes (Signed)
Pt c/o pain while swallowing pills, then she vomits after taking them. Visitor at bedside. Pt has no appetite, not eating she does drink.

## 2014-08-15 NOTE — Progress Notes (Signed)
Patient ID: [name], female   DOB: [birth year], 38 y.o.   MRN: [id number]  [facility] PROGRESS NOTE  [name] HQI:[id number] DOB: Sep 18, 1976 DOA: 08/10/2014 PCP: Philis Fendt, MD  Brief narrative:  Pt is 38 yo female with HIV, Hep B, who presented to [facility] ED with main concern of one month duration of progressively worsening RUQ abdominal pain, throbbing and intermitted, 7/10 in severity when present, non radiating, with no specific urinary concerns, no fevers, chills, no sick contacts or exposures. Pt denies any blood in urine or stool. She has not seen her PCP for the problem.   In ED, pt noted to be hemodynamically stable but in mild distress due to pain. CBC notable for Hg ~ 3, ? Blood work error. 2 U PRBC ordered for transfusion. [facility] asked to admit for further evaluation.   Assessment and Plan:   Active Problems:  Abdominal pain  - from ulcers noted on EGD in the setting of HIV  - Ct abd with no specific acute findings - continue Protonix - appreciate ID input, pt started on ART - ID started acyclovir with continued fluconazole for esophageal ulcers Acute on chronic blood loss anemia  - transfused two unit of blood 8/26: Hg trend 3.6 --> 9, initial blood work likely an error  - pt denies any active bleeding, no blood in urine or stool  - Baseline Hg ~8  Elevated Lipase  - unclear if true pancreatitis  - pt is not taking any ART medications over one month  - lipase is currently WNL  Hypokalemia  - supplemented and WNL  Transaminitis  - possibly from Hep B  - LFT's trending down  HIV  - last CD4# 10 in January 2015  - repeat CD4# 10  - pt was on ART but has not been compliant since January 2015  - started on ART by ID, appreciate input  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 No associated sonographic findings to suggest acute cholecystitis.  CT ABDOMEN AND PELVIS W/C 8/26 No interval change or acute abnormality.  EGD 8/28 by Dr. Ardis Hughs There were two large ulcers in the mid to distal esophagus (each 3-4cm). Neither appeared maligant. The most distal ulcer created incomplete stricture. Biopsies were taken from proximal ulcer edge and ulcer bed. The examination to the distal esophugus was otherwise normal. 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 Benson [place]. Restart HIV medications.  Medical Consultants:   GI  ID Other Consultants:   None Anti-Infectives:   Fluconazole for oral thrush   Roxy Filler, Orpah Melter, MD  [place] Pager [phone]  If 7PM-7AM, please contact night-coverage [email or web address] Password [facility] 08/15/2014, 12:25 PM   LOS: 5 days   HPI/Subjective: No events overnight. Pt complains of mild nausea, reports decreased BM.  Objective: Filed Vitals:   08/14/14 0452 08/14/14 1445 08/14/14 2022 08/15/14 0457  BP: 148/65 148/84 133/79 144/83  Pulse: 51 64 62 60  Temp: 98.3 F (36.8 C) 98.3 F (36.8 C) 98.5 F (36.9 C) 98.1 F (36.7 C)  TempSrc: Oral  Oral Oral Oral  Resp: 16 16 16 16   Height:      Weight:      SpO2: 100% 100% 100% 100%    Intake/Output Summary (Last 24 hours) at 08/15/14 1225 Last data filed at 08/15/14 0935  Gross per 24 hour  Intake 1292.67 ml  Output    700 ml  Net 592.67 ml    Exam:   General:  Pt is alert, follows commands appropriately, not in acute distress  Cardiovascular: Regular rate and rhythm, S1/S2, no murmurs, no rubs, no gallops  Respiratory: Clear to auscultation bilaterally, no wheezing, no crackles, no rhonchi  Abdomen: Soft, decreased BS, feels mildly distended  Data  Reviewed: Basic Metabolic Panel:  Recent Labs Lab 08/10/14 0849 08/11/14 0508 08/12/14 0400 08/13/14 0632 08/15/14 0440  NA 135* 135* 135* 138 136*  K 3.2* 3.3* 3.4* 3.9 3.7  CL 97 101 103 103 101  CO2 22 23 22 23 23   GLUCOSE 96 98 93 99 115*  BUN 6 5* 3* 3* <3*  CREATININE 0.61 0.64 0.66 0.64 0.58  CALCIUM 9.4 8.1* 8.1* 8.3* 8.6   Liver Function Tests:  Recent Labs Lab 08/10/14 0849 08/11/14 0508 08/12/14 0400 08/13/14 0632 08/15/14 0440  AST 52* 40* 39* 40* 90*  ALT 32 23 22 24  40*  ALKPHOS 86 71 75 85 91  BILITOT 0.4 0.5 0.4 0.4 0.3  PROT 7.5 6.1 6.0 6.0 6.0  ALBUMIN 3.4* 2.7* 2.6* 2.6* 2.6*    Recent Labs Lab 08/10/14 0849 08/11/14 0508  LIPASE 62* 56   No results found for this basename: AMMONIA,  in the last 168 hours CBC:  Recent Labs Lab 08/10/14 0849 08/11/14 0508 08/11/14 1300 08/12/14 0400 08/13/14 0632  WBC 5.1 4.5 4.6 4.6 4.2  NEUTROABS 3.9  --   --   --   --   HGB 3.6* 9.0* 8.9* 9.0* 9.1*  HCT 11.7* 28.1* 28.0* 27.2* 28.0*  MCV 62.9* 67.5* 67.5* 66.5* 66.8*  PLT 325 192 189 191 187     Recent Results (from the past 240 hour(s))  URINE CULTURE     Status: None   Collection Time    08/10/14  5:53 PM      Result Value Ref Range Status   Specimen Description URINE, RANDOM   Final   Special Requests NONE   Final   Culture  Setup Time     Final   Value: 08/10/2014 21:57     Performed at [place]     Final   Value: NO GROWTH     Performed at [facility]   Culture     Final   Value: NO GROWTH     Performed at [facility]   Report Status 08/11/2014 FINAL   Final  CULTURE, BLOOD (ROUTINE X 2)     Status: None   Collection Time    08/12/14 10:15 PM      Result Value Ref Range Status   Specimen Description BLOOD R ARM   Final   Special Requests BOTTLES DRAWN AEROBIC ONLY 10CC   Final   Culture  Setup Time     Final   Value: 08/13/2014 01:00     Performed at [facility]    Culture     Final   Value:        BLOOD CULTURE RECEIVED NO GROWTH TO DATE CULTURE WILL BE HELD FOR 5 DAYS BEFORE ISSUING A FINAL  NEGATIVE REPORT     Performed at [facility]   Report Status PENDING   Incomplete  CULTURE, BLOOD (ROUTINE X 2)     Status: None   Collection Time    08/12/14 10:15 PM      Result Value Ref Range Status   Specimen Description BLOOD R HAND   Final   Special Requests BOTTLES DRAWN AEROBIC ONLY 5ML   Final   Culture  Setup Time     Final   Value: 08/13/2014 01:00     Performed at [facility]   Culture     Final   Value:        BLOOD CULTURE RECEIVED NO GROWTH TO DATE CULTURE WILL BE HELD FOR 5 DAYS BEFORE ISSUING A FINAL NEGATIVE REPORT     Performed at [facility]   Report Status PENDING   Incomplete     Scheduled Meds: . acyclovir  5 mg/kg Intravenous 3 times per day  . antiseptic oral rinse  7 mL Mouth Rinse q12n4p  . chlorhexidine  15 mL Mouth Rinse BID  . dolutegravir  50 mg Oral Daily  . emtricitabine-tenofovir  1 tablet Oral Daily  . enoxaparin (LOVENOX) injection  40 mg Subcutaneous Q24H  . feeding supplement (RESOURCE BREEZE)  1 Container Oral BID BM  . fluconazole  100 mg Oral Daily  . pantoprazole  40 mg Oral Daily  . polyethylene glycol  17 g Oral Daily  . senna-docusate  1 tablet Oral BID   Continuous Infusions:

## 2014-08-15 NOTE — Progress Notes (Addendum)
[name]  Day # 3 Tivicay and Truvada Day #2 acylclovir Day # 5 fluconazole  Subjective: Still c/o epigastric pain, thinks pill is stuck in her esophagus   Antibiotics:  Anti-infectives   Start     Dose/Rate Route Frequency Ordered Stop   08/14/14 1630  acyclovir (ZOVIRAX) 370 mg in dextrose 5 % 100 mL IVPB     5 mg/kg  73.8 kg 107.4 mL/hr over 60 Minutes Intravenous 3 times per day 08/14/14 1612 08/21/14 1359   08/13/14 2330  dolutegravir (TIVICAY) tablet 50 mg     50 mg Oral Daily 08/13/14 1828     08/13/14 2000  emtricitabine-tenofovir (TRUVADA) 200-300 MG per tablet 1 tablet     1 tablet Oral Daily 08/13/14 1828     08/11/14 0900  fluconazole (DIFLUCAN) tablet 100 mg     100 mg Oral Daily 08/11/14 0852        Medications: Scheduled Meds: . acyclovir  5 mg/kg Intravenous 3 times per day  . antiseptic oral rinse  7 mL Mouth Rinse q12n4p  . chlorhexidine  15 mL Mouth Rinse BID  . dolutegravir  50 mg Oral Daily  . emtricitabine-tenofovir  1 tablet Oral Daily  . enoxaparin (LOVENOX) injection  40 mg Subcutaneous Q24H  . feeding supplement (RESOURCE BREEZE)  1 Container Oral BID BM  . fluconazole  100 mg Oral Daily  . pantoprazole  40 mg Oral Daily  . polyethylene glycol  17 g Oral Daily  . senna-docusate  1 tablet Oral BID   Continuous Infusions:  PRN Meds:.gi cocktail, HYDROmorphone (DILAUDID) injection, ondansetron (ZOFRAN) IV, ondansetron, oxyCODONE    Objective: Weight change:   Intake/Output Summary (Last 24 hours) at 08/15/14 1731 Last data filed at 08/15/14 1421  Gross per 24 hour  Intake 1586.84 ml  Output   1300 ml  Net 286.84 ml   Blood pressure 160/83, pulse 100, temperature 98.9 F (37.2 C), temperature source Oral, resp. rate 18, height 6' (1.829 m), weight 162 lb 11.2 oz (73.8 kg), last menstrual period 05/21/2014, SpO2 100.00%. Temp:  [98.1 F (36.7 C)-98.9 F (37.2 C)] 98.9 F (37.2 C) (08/31 1430) Pulse  Rate:  [60-100] 100 (08/31 1430) Resp:  [16-18] 18 (08/31 1430) BP: (133-160)/(79-83) 160/83 mmHg (08/31 1430) SpO2:  [100 %] 100 % (08/31 1430)  Physical Exam: General: Alert and awake, oriented x3, not in any acute distress.  HEENT: anicteric sclera, pupils reactive to light and accommodation, EOMI, oropharynx clear and without exudate  CVS regular rate, normal r, Chest:  no wheezing, Abdomen:  nondistended,  Skin: no rashes  Neuro: nonfocal, strength and sensation intact   CBC: CBC Latest Ref Rng 08/13/2014 08/12/2014 08/11/2014  WBC 4.0 - 10.5 K/uL 4.2 4.6 4.6  Hemoglobin 12.0 - 15.0 g/dL 9.1(L) 9.0(L) 8.9(L)  Hematocrit 36.0 - 46.0 % 28.0(L) 27.2(L) 28.0(L)  Platelets 150 - 400 K/uL 187 191 189      BMET  Recent Labs  08/13/14 0632 08/15/14 0440  NA 138 136*  K 3.9 3.7  CL 103 101  CO2 23 23  GLUCOSE 99 115*  BUN 3* <3*  CREATININE 0.64 0.58  CALCIUM 8.3* 8.6     Liver Panel   Recent Labs  08/13/14 0632 08/15/14 0440  PROT 6.0 6.0  ALBUMIN 2.6* 2.6*  AST 40* 90*  ALT 24 40*  ALKPHOS 85 91  BILITOT 0.4 0.3       Sedimentation Rate No results found for  this basename: ESRSEDRATE,  in the last 72 hours C-Reactive Protein No results found for this basename: CRP,  in the last 72 hours  Micro Results: Recent Results (from the past 720 hour(s))  URINE CULTURE     Status: None   Collection Time    07/22/14  2:43 AM      Result Value Ref Range Status   Specimen Description URINE, CLEAN CATCH   Final   Special Requests NONE   Final   Culture  Setup Time     Final   Value: 07/22/2014 09:28     Performed at [place]     Final   Value: >=100,000 COLONIES/ML     Performed at [facility]   Culture     Final   Value: ESCHERICHIA COLI     Performed at [facility]   Report Status 07/24/2014 FINAL   Final   Organism ID, Bacteria ESCHERICHIA COLI   Final  URINE CULTURE     Status: None   Collection Time     08/10/14  5:53 PM      Result Value Ref Range Status   Specimen Description URINE, RANDOM   Final   Special Requests NONE   Final   Culture  Setup Time     Final   Value: 08/10/2014 21:57     Performed at [place]     Final   Value: NO GROWTH     Performed at [facility]   Culture     Final   Value: NO GROWTH     Performed at [facility]   Report Status 08/11/2014 FINAL   Final  CULTURE, BLOOD (ROUTINE X 2)     Status: None   Collection Time    08/12/14 10:15 PM      Result Value Ref Range Status   Specimen Description BLOOD R ARM   Final   Special Requests BOTTLES DRAWN AEROBIC ONLY 10CC   Final   Culture  Setup Time     Final   Value: 08/13/2014 01:00     Performed at [facility]   Culture     Final   Value:        BLOOD CULTURE RECEIVED NO GROWTH TO DATE CULTURE WILL BE HELD FOR 5 DAYS BEFORE ISSUING A FINAL NEGATIVE REPORT     Performed at [facility]   Report Status PENDING   Incomplete  CULTURE, BLOOD (ROUTINE X 2)     Status: None   Collection Time    08/12/14 10:15 PM      Result Value Ref Range Status   Specimen Description BLOOD R HAND   Final   Special Requests BOTTLES DRAWN AEROBIC ONLY 5ML   Final   Culture  Setup Time     Final   Value: 08/13/2014 01:00     Performed at [facility]   Culture     Final   Value:        BLOOD CULTURE RECEIVED NO GROWTH TO DATE CULTURE WILL BE HELD FOR 5 DAYS BEFORE ISSUING A FINAL NEGATIVE REPORT     Performed at [facility]   Report Status PENDING   Incomplete    Studies/Results: No results found.    Assessment/Plan:  Active Problems:   Abdominal pain, acute    [name] is a 38 y.o. female with  with HIV,  AIDS noncompliance, esophageal ulcers sp repeat EGD by Dr. Ardis Hughs.   #1 HIV/AIDs:  --started Tivicay and Truvada and she is tolerating these fine (I  Would recommend crushing the Truvada and giving with applesauce since this  is the bigger of two pills) -- --check HLA b701 in case wish to go to [facility] though pill size matters to her (this tablet can also be crushed)   -- WAS GOING TO START dapsone today (g6pd is normal actually high) BUT WHEN I BEGAN TO ORDER IT LATER IN DAY IT CAME UP AS ALLERGY THAT CAUSED ITHCING, WOULD DISCUSS IN AM IF SHE CAN TOLERATE THIS. OTHER OPTION IS INHALED PENTAMIDINE MONTHLY  --will need weekly azithromycin 1280m   --SHE WILL NEED EMERGENT, URGENT ADAP APPLICATION --I have asked Cassandra to come and meet with patient and she will do so in the am and enroll in ADAP  Per Cassandra the patient has let her fill out paperwork in the past but would never open door for her or for Mitch (other bridge counselor)> She apparently once did let in KMarlowe Kays(former bEngineer, materials  I did discuss with the patient and she voiced dislike for Cassandra and stated that she would not want to have her come to her house  I tried to find out if there were other reasons other than nausea and difficulty tolerating the previous PI regimen or pills period with her ulcerative esophagitis and she said no  She is having a PICC inserted for ease of blood draws and I would contemplate the idea of giving her Fuzeon via PICC with home infusion company  This might serve a few different purposes  #A this would ensure that she gets active ARV into blood stream and no problems getting past her esophagus (though not complete regimen)  #B it might hasten VL drop along with potent integrase inhibitor  #C it would means she would need home health which could be additional help for helping manage her care  I spent greater than 60 minutes with the patient including greater than 50% of time in face to face counsel of the patient and in coordination of their care.    # 2 Esophageal ulcers: I started IV acyclovir and followup pathology report, continue fluconazole  .-   Dr. CMegan Salonwill see her tomorrow as he  takes over the service and is also her Primary ID provider in the clinic.      LOS: 5 days   CAlcide Evener8/31/2015, 5:31 PM

## 2014-08-15 NOTE — Progress Notes (Signed)
Peripherally Inserted Central Catheter/Midline Placement  The IV Nurse has discussed with the patient and/or persons authorized to consent for the patient, the purpose of this procedure and the potential benefits and risks involved with this procedure.  The benefits include less needle sticks, lab draws from the catheter and patient may be discharged home with the catheter.  Risks include, but not limited to, infection, bleeding, blood clot (thrombus formation), and puncture of an artery; nerve damage and irregular heat beat.  Alternatives to this procedure were also discussed.  PICC/Midline Placement Documentation        [name] 08/15/2014, 7:00 PM

## 2014-08-16 DIAGNOSIS — K2210 Ulcer of esophagus without bleeding: Secondary | ICD-10-CM

## 2014-08-16 DIAGNOSIS — F319 Bipolar disorder, unspecified: Secondary | ICD-10-CM

## 2014-08-16 LAB — BASIC METABOLIC PANEL
Anion gap: 16 — ABNORMAL HIGH (ref 5–15)
CALCIUM: 9.1 mg/dL (ref 8.4–10.5)
CO2: 21 mEq/L (ref 19–32)
Chloride: 100 mEq/L (ref 96–112)
Creatinine, Ser: 0.58 mg/dL (ref 0.50–1.10)
GFR calc Af Amer: >90 mL/min (ref >90)
GFR calc non Af Amer: >90 mL/min (ref >90)
Glucose, Bld: 101 mg/dL — ABNORMAL HIGH (ref 70–99)
POTASSIUM: 3.4 meq/L — AB (ref 3.7–5.3)
Sodium: 137 mEq/L (ref 137–147)

## 2014-08-16 LAB — CBC
HCT: 30.1 % — ABNORMAL LOW (ref 36.0–46.0)
HEMOGLOBIN: 9.9 g/dL — AB (ref 12.0–15.0)
MCH: 21.8 pg — AB (ref 26.0–34.0)
MCHC: 32.9 g/dL (ref 30.0–36.0)
MCV: 66.3 fL — AB (ref 78.0–100.0)
Platelets: 168 10*3/uL (ref 150–400)
RBC: 4.54 MIL/uL (ref 3.87–5.11)
RDW: 21.6 % — ABNORMAL HIGH (ref 11.5–15.5)
WBC: 4.4 10*3/uL (ref 4.0–10.5)

## 2014-08-16 LAB — T-HELPER CELLS (CD4) COUNT (NOT AT ARMC)
CD4 % Helper T Cell: 1 % — ABNORMAL LOW (ref 33–55)
CD4 T Cell Abs: <10 /uL — ABNORMAL LOW (ref 400–2700)

## 2014-08-16 MED ORDER — POTASSIUM CHLORIDE CRYS ER 20 MEQ PO TBCR
40.0000 meq | EXTENDED_RELEASE_TABLET | Freq: Once | ORAL | Status: AC
  Administered 2014-08-16: 40 meq via ORAL
  Filled 2014-08-16: qty 2

## 2014-08-16 MED ORDER — DAPSONE 100 MG PO TABS
100.0000 mg | ORAL_TABLET | Freq: Every day | ORAL | Status: DC
  Administered 2014-08-16 – 2014-08-20 (×5): 100 mg via ORAL
  Filled 2014-08-16 (×5): qty 1

## 2014-08-16 MED ORDER — FLUCONAZOLE 100 MG PO TABS: 100.0000 mg | ORAL_TABLET | ORAL | Status: DC

## 2014-08-16 NOTE — Progress Notes (Signed)
Patient ID: [name], female   DOB: [birth year], 38 y.o.   MRN: [id number] [facility] PROGRESS NOTE  [name] ZCH:[id number] DOB: Apr 12, 1976 DOA: 08/10/2014 PCP: Philis Fendt, MD  Brief narrative:  Pt is 38 yo female with HIV, Hep B, who presented to [facility] ED with main concern of one month duration of progressively worsening RUQ abdominal pain, throbbing and intermitted, 7/10 in severity when present, non radiating, with no specific urinary concerns, no fevers, chills, no sick contacts or exposures. Pt denies any blood in urine or stool. She has not seen her PCP for the problem.   In ED, pt noted to be hemodynamically stable but in mild distress due to pain. CBC notable for Hg ~ 3, ? Blood work error. 2 U PRBC ordered for transfusion. TR[facility] asked to admit for further evaluation.   Assessment and Plan:   Active Problems:  Abdominal pain  - from ulcers noted on EGD in the setting of HIV  - Ct abd with no specific acute findings  - continue Protonix  - appreciate ID input, pt started on ART  - ID started acyclovir with continued fluconazole for esophageal ulcers  Acute on chronic blood loss anemia  - transfused two unit of blood 8/26: Hg trend 3.6 --> 9, initial blood work likely an error  - pt denies any active bleeding, no blood in urine or stool  - Baseline Hg ~8  Elevated Lipase  - unclear if true pancreatitis  - last lipase is WNL  Hypokalemia  - continue to supplement and repeat BMP in AM Transaminitis  - trending up  - repeat in AM HIV  - last CD4# 10 in January 2015  - repeat CD4# 10  - pt was on ART but has not been compliant since January 2015  - started on ART by ID, appreciate input  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 No associated sonographic findings to suggest acute cholecystitis.  CT ABDOMEN AND PELVIS W/C 8/26 No interval change or acute abnormality.  EGD 8/28 by Dr. Ardis Hughs There were two large ulcers in the mid to distal esophagus (each 3-4cm). Neither appeared maligant. The most distal ulcer created incomplete stricture. Biopsies were taken from proximal ulcer edge and ulcer bed. The examination to the distal esophugus was otherwise normal. 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 Benson Norway. Restart HIV medications.  Medical Consultants:   GI  ID Other Consultants:   None Anti-Infectives:    Fluconazole 8/27 -->  Acyclovir 8/30 -->   Faye Ramsay, MD  [facility] Pager [phone]  If 7PM-7AM, please contact night-coverage [email or web address] Password [email or web address] 08/16/2014, 3:21 PM   LOS: 6 days   HPI/Subjective: No events overnight. Pain with swallowing.   Objective: Filed Vitals:   08/15/14 1945 08/15/14 2227 08/16/14 0535 08/16/14 1427  BP:  123/83 132/75 130/85  Pulse: 64 68 75 82  Temp:  98.4 F (36.9 C) 97.6 F (36.4 C) 98.2 F (36.8 C)  TempSrc:  Oral Oral Oral  Resp:  16 16 18   Height:  Weight:      SpO2:  100% 100% 100%    Intake/Output Summary (Last 24 hours) at 08/16/14 1521 Last data filed at 08/16/14 1021  Gross per 24 hour  Intake   1666 ml  Output    940 ml  Net    726 ml    Exam:   General:  Pt is alert, follows commands appropriately, not in acute distress  Cardiovascular: Regular rate and rhythm, S1/S2, no murmurs, no rubs, no gallops  Respiratory: Clear to auscultation bilaterally, no wheezing, no crackles, no rhonchi  Abdomen: Soft, non tender, non distended, bowel sounds present, no guarding  Extremities: No edema, pulses DP and PT palpable bilaterally  Neuro:  Grossly nonfocal  Data Reviewed: Basic Metabolic Panel:  Recent Labs Lab 08/11/14 0508 08/12/14 0400 08/13/14 0632 08/15/14 0440 08/16/14 0500  NA 135* 135* 138 136* 137  K 3.3* 3.4* 3.9 3.7 3.4*  CL 101 103 103 101 100  CO2 23 22 23 23 21   GLUCOSE 98 93 99 115* 101*  BUN 5* 3* 3* <3* <3*  CREATININE 0.64 0.66 0.64 0.58 0.58  CALCIUM 8.1* 8.1* 8.3* 8.6 9.1   Liver Function Tests:  Recent Labs Lab 08/10/14 0849 08/11/14 0508 08/12/14 0400 08/13/14 0632 08/15/14 0440  AST 52* 40* 39* 40* 90*  ALT 32 23 22 24  40*  ALKPHOS 86 71 75 85 91  BILITOT 0.4 0.5 0.4 0.4 0.3  PROT 7.5 6.1 6.0 6.0 6.0  ALBUMIN 3.4* 2.7* 2.6* 2.6* 2.6*    Recent Labs Lab 08/10/14 0849 08/11/14 0508  LIPASE 62* 56   CBC:  Recent Labs Lab 08/10/14 0849 08/11/14 0508 08/11/14 1300 08/12/14 0400 08/13/14 0632 08/16/14 0500  WBC 5.1 4.5 4.6 4.6 4.2 4.4  NEUTROABS 3.9  --   --   --   --   --   HGB 3.6* 9.0* 8.9* 9.0* 9.1* 9.9*  HCT 11.7* 28.1* 28.0* 27.2* 28.0* 30.1*  MCV 62.9* 67.5* 67.5* 66.5* 66.8* 66.3*  PLT 325 192 189 191 187 168   Recent Results (from the past 240 hour(s))  URINE CULTURE     Status: None   Collection Time    08/10/14  5:53 PM      Result Value Ref Range Status   Specimen Description URINE, RANDOM   Final   Special Requests NONE   Final   Culture  Setup Time     Final   Value: 08/10/2014 21:57     Performed at [place]     Final   Value: NO GROWTH     Performed at [facility]   Culture     Final   Value: NO GROWTH     Performed at [facility]   Report Status 08/11/2014 FINAL   Final  CULTURE, BLOOD (ROUTINE X 2)     Status: None   Collection Time    08/12/14 10:15 PM      Result Value Ref Range Status   Specimen Description BLOOD R ARM   Final   Special Requests BOTTLES DRAWN AEROBIC ONLY 10CC   Final   Culture  Setup Time     Final   Value: 08/13/2014 01:00     Performed at [facility]    Culture     Final   Value:        BLOOD CULTURE RECEIVED NO GROWTH TO DATE CULTURE WILL BE HELD FOR 5 DAYS BEFORE ISSUING  A FINAL NEGATIVE REPORT     Performed at [facility]   Report Status PENDING   Incomplete  CULTURE, BLOOD (ROUTINE X 2)     Status: None   Collection Time    08/12/14 10:15 PM      Result Value Ref Range Status   Specimen Description BLOOD R HAND   Final   Special Requests BOTTLES DRAWN AEROBIC ONLY 5ML   Final   Culture  Setup Time     Final   Value: 08/13/2014 01:00     Performed at [facility]   Culture     Final   Value:        BLOOD CULTURE RECEIVED NO GROWTH TO DATE CULTURE WILL BE HELD FOR 5 DAYS BEFORE ISSUING A FINAL NEGATIVE REPORT     Performed at [facility]   Report Status PENDING   Incomplete     Scheduled Meds: . acyclovir  5 mg/kg Intravenous 3 times per day  . antiseptic oral rinse  7 mL Mouth Rinse q12n4p  . chlorhexidine  15 mL Mouth Rinse BID  . dolutegravir  50 mg Oral Daily  . emtricitabine-tenofovir  1 tablet Oral Daily  . enoxaparin (LOVENOX) injection  40 mg Subcutaneous Q24H  . feeding supplement (RESOURCE BREEZE)  1 Container Oral BID BM  . fluconazole  100 mg Oral Daily  . pantoprazole  40 mg Oral Daily  . polyethylene glycol  17 g Oral Daily  . senna-docusate  1 tablet Oral BID   Continuous Infusions:

## 2014-08-16 NOTE — Progress Notes (Signed)
Patient ID: [name], female   DOB: April 13, 1976, 38 y.o.   MRN: [id number]         [place] [facility]    Date of Admission:  08/10/2014           Day 6 fluconazole        Day 3 acyclovir  Active Problems:   Abdominal pain, acute   . acyclovir  5 mg/kg Intravenous 3 times per day  . antiseptic oral rinse  7 mL Mouth Rinse q12n4p  . chlorhexidine  15 mL Mouth Rinse BID  . dolutegravir  50 mg Oral Daily  . emtricitabine-tenofovir  1 tablet Oral Daily  . enoxaparin (LOVENOX) injection  40 mg Subcutaneous Q24H  . feeding supplement (RESOURCE BREEZE)  1 Container Oral BID BM  . fluconazole  100 mg Oral Daily  . pantoprazole  40 mg Oral Daily  . polyethylene glycol  17 g Oral Daily  . potassium chloride  40 mEq Oral Once  . senna-docusate  1 tablet Oral BID    Subjective: Stefan says she is having less problems swallowing now but still having right upper quadrant pain. She quit taking her HIV medications about one month ago when she began to have more problems with nausea and vomiting. Unfortunately she did not call to let us know. She thinks that atovaquone is causing her nausea. She also stopped taking her Zyprexa and Cogentin several months ago after her prescription ran out and she did not go back to see the psychiatrist at [facility]. She has been feeling more depressed and withdrawn. He was found to have recurrent esophageal ulcers after admission here recently and worsening esophageal stricture. She was restarted on antiretroviral regimen of Truvada and Tivicay and states that she has been able to swallow the pills so far. Her nausea has improved. Review of Systems: Constitutional: positive for weight loss, negative for anorexia, chills, fevers and sweats Eyes: negative Ears, nose, mouth, throat, and face: negative Respiratory: negative Cardiovascular: negative Gastrointestinal: positive for abdominal pain, dysphagia, nausea, odynophagia and vomiting,  negative for change in bowel habits and diarrhea Genitourinary:negative  Past Medical History  Diagnosis Date  . HIV positive   . Hepatitis B     /E-chart  . Microcytic anemia     h/o per E-chart  . 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 Noncompliance with medication regimen     /e-chart  . 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 Pyelonephritis     h/o per E-chart  . Thyroid disease   . Depression   . Asthma     inhaler 2xday    History  Substance Use Topics  . Smoking status: Never Smoker   . Smokeless tobacco: Never Used  . Alcohol Use: Yes     Comment: occ    Family History  Problem Relation Age of Onset  . Cancer Mother   . Diabetes Mother   . Diabetes Father   . Heart disease Father   . Cancer Sister   . Diabetes Sister   . Asthma Son     Allergies  Allergen Reactions  . Bactrim Itching  . Dapsone Itching  . Orange Fruit Itching  . Peanut-Containing Drug Products Hives  . Shellfish Allergy Hives    Objective: Temp:  [97.6 F (36.4 C)-98.4 F (36.9 C)] 98.2 F (36.8 C) (09/01 1427) Pulse Rate:  [64-82] 82 (09/01 1427)  Resp:  [16-18] 18 (09/01 1427) BP: (123-132)/(75-85) 130/85 mmHg (09/01 1427) SpO2:  [100 %] 100 % (09/01 1427)  General: She is smiling and in no distress Skin: No rash Oral: No thrush or other oropharyngeal lesions Lungs: Clear regular S1 and S2 no murmurs Cor: Regular Abdomen: Soft and nontender Mood and affect: Appropriate  Lab Results Lab Results  Component Value Date   WBC 4.4 08/16/2014   HGB 9.9* 08/16/2014   HCT 30.1* 08/16/2014   MCV 66.3* 08/16/2014   PLT 168 08/16/2014    Lab Results  Component Value Date   CREATININE 0.58 08/16/2014   BUN <3* 08/16/2014   NA 137 08/16/2014   K 3.4* 08/16/2014   CL 100 08/16/2014   CO2 21 08/16/2014    Lab Results  Component Value Date   ALT 40* 08/15/2014   AST 90* 08/15/2014   ALKPHOS 91 08/15/2014   BILITOT 0.3  08/15/2014    HIV 1 RNA Quant (copies/mL)  Date Value  01/03/2014 10300*  10/18/2013 606301*  08/10/2013 601093*     CD4 T Cell Abs (/uL)  Date Value  08/16/2014 <10*  08/11/2014 10*  01/03/2014 10*   Microbiology: Recent Results (from the past 240 hour(s))  URINE CULTURE     Status: None   Collection Time    08/10/14  5:53 PM      Result Value Ref Range Status   Specimen Description URINE, RANDOM   Final   Special Requests NONE   Final   Culture  Setup Time     Final   Value: 08/10/2014 21:57     Performed at [facility]     Final   Value: NO GROWTH     Performed at [facility]   Culture     Final   Value: NO GROWTH     Performed at [facility]   Report Status 08/11/2014 FINAL   Final  CULTURE, BLOOD (ROUTINE X 2)     Status: None   Collection Time    08/12/14 10:15 PM      Result Value Ref Range Status   Specimen Description BLOOD R ARM   Final   Special Requests BOTTLES DRAWN AEROBIC ONLY 10CC   Final   Culture  Setup Time     Final   Value: 08/13/2014 01:00     Performed at [facility]   Culture     Final   Value:        BLOOD CULTURE RECEIVED NO GROWTH TO DATE CULTURE WILL BE HELD FOR 5 DAYS BEFORE ISSUING A FINAL NEGATIVE REPORT     Performed at [facility]   Report Status PENDING   Incomplete  CULTURE, BLOOD (ROUTINE X 2)     Status: None   Collection Time    08/12/14 10:15 PM      Result Value Ref Range Status   Specimen Description BLOOD R HAND   Final   Special Requests BOTTLES DRAWN AEROBIC ONLY 5ML   Final   Culture  Setup Time     Final   Value: 08/13/2014 01:00     Performed at [facility]   Culture     Final   Value:        BLOOD CULTURE RECEIVED NO GROWTH TO DATE CULTURE WILL BE HELD FOR 5 DAYS BEFORE ISSUING A FINAL NEGATIVE REPORT     Performed at [facility]   Report  Status PENDING   Incomplete    Assessment: Unfortunately she continues to have a variety of problems  that affect her ability to adhere to antiretroviral therapy consistently. His name to try Truvada and Tivicay. Her viral load and genotype resistance assay are pending.  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 I will continue acyclovir for now. Once her ulcers he she may benefit from esophageal dilatation for stricture.  She needs to start pneumocystis prophylaxis. She has a history of 18 with trimethoprim sulfamethoxazole and dapsone and does not tolerate atovaquone. She is willing to retry dapsone.  She's currently not on therapy for her bipolar disorder. She generally feels much better and finds it easier to cope with stress and stay on medication when she is taking her Zyprexa and Cogentin. I would recommend restarting these dictations.  Plan: 1. Continue Truvada and Tivicay 2. Continue acyclovir for now 3. Change fluconazole to a weekly, prophylactic dose 4. Start dapsone 5. Recommend restarting Zyprexa and Cogentin  Michel Bickers, MD [facility] for [place] [phone] pager   [phone] cell 08/16/2014, 4:38 PM

## 2014-08-16 NOTE — Plan of Care (Signed)
Problem: Phase III Progression Outcomes Goal: Pain controlled on oral analgesia Outcome: Not Met (add Reason) Patient took oxyir last pm without any relief in abdominal pain. Has taken IV dilaudid last night with pain decreasing to "tolerable" rate.

## 2014-08-17 LAB — CBC
HCT: 28.4 % — ABNORMAL LOW (ref 36.0–46.0)
Hemoglobin: 9.3 g/dL — ABNORMAL LOW (ref 12.0–15.0)
MCH: 21.7 pg — ABNORMAL LOW (ref 26.0–34.0)
MCHC: 32.7 g/dL (ref 30.0–36.0)
MCV: 66.4 fL — AB (ref 78.0–100.0)
PLATELETS: 175 10*3/uL (ref 150–400)
RBC: 4.28 MIL/uL (ref 3.87–5.11)
RDW: 21.4 % — ABNORMAL HIGH (ref 11.5–15.5)
WBC: 4.2 10*3/uL (ref 4.0–10.5)

## 2014-08-17 LAB — COMPREHENSIVE METABOLIC PANEL
ALK PHOS: 92 U/L (ref 39–117)
ALT: 34 U/L (ref 0–35)
AST: 59 U/L — ABNORMAL HIGH (ref 0–37)
Albumin: 2.9 g/dL — ABNORMAL LOW (ref 3.5–5.2)
Anion gap: 14 (ref 5–15)
BUN: <3 mg/dL — ABNORMAL LOW (ref 6–23)
CHLORIDE: 100 meq/L (ref 96–112)
CO2: 24 mEq/L (ref 19–32)
CREATININE: 0.56 mg/dL (ref 0.50–1.10)
Calcium: 8.9 mg/dL (ref 8.4–10.5)
GFR calc Af Amer: >90 mL/min (ref >90)
Glucose, Bld: 111 mg/dL — ABNORMAL HIGH (ref 70–99)
Potassium: 3.2 mEq/L — ABNORMAL LOW (ref 3.7–5.3)
Sodium: 138 mEq/L (ref 137–147)
Total Bilirubin: 0.3 mg/dL (ref 0.3–1.2)
Total Protein: 6.5 g/dL (ref 6.0–8.3)

## 2014-08-17 LAB — HIV-1 RNA QUANT-NO REFLEX-BLD
HIV 1 RNA Quant: 95482 copies/mL — ABNORMAL HIGH (ref <20)
HIV-1 RNA QUANT, LOG: 4.98 {Log} — AB (ref <1.30)

## 2014-08-17 MED ORDER — BENZTROPINE MESYLATE 1 MG PO TABS
1.0000 mg | ORAL_TABLET | Freq: Every day | ORAL | Status: DC
  Administered 2014-08-17 – 2014-08-20 (×4): 1 mg via ORAL
  Filled 2014-08-17 (×4): qty 1

## 2014-08-17 MED ORDER — PANTOPRAZOLE SODIUM 40 MG PO TBEC
40.0000 mg | DELAYED_RELEASE_TABLET | Freq: Two times a day (BID) | ORAL | Status: DC
  Administered 2014-08-17 – 2014-08-20 (×6): 40 mg via ORAL
  Filled 2014-08-17 (×7): qty 1

## 2014-08-17 MED ORDER — OLANZAPINE 5 MG PO TABS
5.0000 mg | ORAL_TABLET | Freq: Every day | ORAL | Status: DC
  Administered 2014-08-17 – 2014-08-19 (×3): 5 mg via ORAL
  Filled 2014-08-17 (×4): qty 1

## 2014-08-17 MED ORDER — SENNOSIDES 8.8 MG/5ML PO SYRP
5.0000 mL | ORAL_SOLUTION | Freq: Two times a day (BID) | ORAL | Status: DC
  Administered 2014-08-17 – 2014-08-19 (×5): 5 mL via ORAL
  Filled 2014-08-17 (×8): qty 5

## 2014-08-17 NOTE — Progress Notes (Signed)
Patient ID: [name], female   DOB: May 02, 1976, 38 y.o.   MRN: [id number]  [facility] PROGRESS NOTE  VICI NOVICK OVZ:[id number] DOB: 23-Dec-1975 DOA: 08/10/2014 PCP: Philis Fendt, MD  Brief narrative:  Pt is 38 yo female with HIV, Hep B, who presented to [facility] ED with main concern of one month duration of progressively worsening RUQ abdominal pain, throbbing and intermitted, 7/10 in severity when present, non radiating, with no specific urinary concerns, no fevers, chills, no sick contacts or exposures. Pt denies any blood in urine or stool. She has not seen her PCP for the problem.   In ED, pt noted to be hemodynamically stable but in mild distress due to pain. CBC notable for Hg ~ 3, ? Blood work error. 2 U PRBC ordered for transfusion. [facility] asked to admit for further evaluation.   Assessment and Plan:   Active Problems:  Abdominal pain  - still having epigastric pain, will restart PPI - Ct abd with no specific acute findings  - appreciate ID input, pt started on ART  - started acyclovir as well - continue fluconazole for esophageal ulcers  Acute on chronic blood loss anemia  - transfused two unit of blood 8/26: Hg trend 3.6 --> 9, initial blood work likely an error  - pt denies any active bleeding, no blood in urine or stool  - Baseline Hg ~8  Hypokalemia  - continue to supplement and repeat BMP in AM  Transaminitis  - trending down - repeat in AM  HIV  - last CD4# 10 in January 2015  - repeat CD4# 10  - started on ART by ID, appreciate input  - SW consult to assist with discharge and medication needed  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 No  associated sonographic findings to suggest acute cholecystitis.  CT ABDOMEN AND PELVIS W/C 8/26 No interval change or acute abnormality.  EGD 8/28 by Dr. Ardis Hughs There were two large ulcers in the mid to distal esophagus (each 3-4cm). Neither appeared maligant. The most distal ulcer created incomplete stricture. Biopsies were taken from proximal ulcer edge and ulcer bed. The examination to the distal esophugus was otherwise normal. 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 Benson Norway. Restart HIV medications.  Medical Consultants:   GI  ID Other Consultants:   None Anti-Infectives:   Fluconazole 8/27 --> Acyclovir 8/30 --> Dolutegravir -->  Truvada --> Dapsone -->   Faye Ramsay, MD  [facility] Pager [phone]  If 7PM-7AM, please contact night-coverage [email or web address] Password [facility] 08/17/2014, 3:38 PM   LOS: 7 days   HPI/Subjective: No events overnight. Still pain in the epigastric area.   Objective: Filed Vitals:   08/16/14 1427 08/16/14 2125 08/17/14 0603 08/17/14 1446  BP: 130/85 141/82 139/78 140/83  Pulse: 82 68 59 74  Temp: 98.2 F (36.8 C) 98.2 F (36.8 C) 97.7 F (36.5 C) 98.3 F (36.8 C)  TempSrc: Oral Oral Oral Oral  Resp: 18 20 20 18   Height:      Weight:      SpO2: 100%  100% 100% 100%    Intake/Output Summary (Last 24 hours) at 08/17/14 1538 Last data filed at 08/17/14 1236  Gross per 24 hour  Intake  227.4 ml  Output    500 ml  Net -272.6 ml    Exam:   General:  Pt is alert, follows commands appropriately, not in acute distress  Cardiovascular: Regular rate and rhythm, S1/S2, no murmurs, no rubs, no gallops  Respiratory: Clear to auscultation bilaterally, no wheezing, no crackles, no rhonchi  Abdomen: Soft, tender in epigastric area, non distended, bowel sounds present, no guarding  Data Reviewed: Basic Metabolic Panel:  Recent Labs Lab  08/12/14 0400 08/13/14 0632 08/15/14 0440 08/16/14 0500 08/17/14 0500  NA 135* 138 136* 137 138  K 3.4* 3.9 3.7 3.4* 3.2*  CL 103 103 101 100 100  CO2 22 23 23 21 24   GLUCOSE 93 99 115* 101* 111*  BUN 3* 3* <3* <3* <3*  CREATININE 0.66 0.64 0.58 0.58 0.56  CALCIUM 8.1* 8.3* 8.6 9.1 8.9   Liver Function Tests:  Recent Labs Lab 08/11/14 0508 08/12/14 0400 08/13/14 0632 08/15/14 0440 08/17/14 0500  AST 40* 39* 40* 90* 59*  ALT 23 22 24  40* 34  ALKPHOS 71 75 85 91 92  BILITOT 0.5 0.4 0.4 0.3 0.3  PROT 6.1 6.0 6.0 6.0 6.5  ALBUMIN 2.7* 2.6* 2.6* 2.6* 2.9*    Recent Labs Lab 08/11/14 0508  LIPASE 56   CBC:  Recent Labs Lab 08/11/14 1300 08/12/14 0400 08/13/14 0632 08/16/14 0500 08/17/14 0500  WBC 4.6 4.6 4.2 4.4 4.2  HGB 8.9* 9.0* 9.1* 9.9* 9.3*  HCT 28.0* 27.2* 28.0* 30.1* 28.4*  MCV 67.5* 66.5* 66.8* 66.3* 66.4*  PLT 189 191 187 168 175   Recent Results (from the past 240 hour(s))  URINE CULTURE     Status: None   Collection Time    08/10/14  5:53 PM      Result Value Ref Range Status   Specimen Description URINE, RANDOM   Final   Special Requests NONE   Final   Culture  Setup Time     Final   Value: 08/10/2014 21:57     Performed at [place]     Final   Value: NO GROWTH     Performed at [facility]   Culture     Final   Value: NO GROWTH     Performed at [facility]   Report Status 08/11/2014 FINAL   Final  CULTURE, BLOOD (ROUTINE X 2)     Status: None   Collection Time    08/12/14 10:15 PM      Result Value Ref Range Status   Specimen Description BLOOD R ARM   Final   Special Requests BOTTLES DRAWN AEROBIC ONLY 10CC   Final   Culture  Setup Time     Final   Value: 08/13/2014 01:00     Performed at [facility]   Culture     Final   Value:        BLOOD CULTURE RECEIVED NO GROWTH TO DATE CULTURE WILL BE HELD FOR 5 DAYS BEFORE ISSUING A FINAL NEGATIVE REPORT     Performed at [facility]   Report Status PENDING   Incomplete  CULTURE, BLOOD (ROUTINE X 2)     Status: None   Collection Time    08/12/14 10:15 PM      Result Value Ref Range  Status   Specimen Description BLOOD R HAND   Final   Special Requests BOTTLES DRAWN AEROBIC ONLY 5ML   Final   Culture  Setup Time     Final   Value: 08/13/2014 01:00     Performed at [facility]   Culture     Final   Value:        BLOOD CULTURE RECEIVED NO GROWTH TO DATE CULTURE WILL BE HELD FOR 5 DAYS BEFORE ISSUING A FINAL NEGATIVE REPORT     Performed at [facility]   Report Status PENDING   Incomplete     Scheduled Meds: . acyclovir  5 mg/kg Intravenous 3 times per day  . antiseptic oral rinse  7 mL Mouth Rinse q12n4p  . benztropine  1 mg Oral Daily  . chlorhexidine  15 mL Mouth Rinse BID  . dapsone  100 mg Oral Daily  . dolutegravir  50 mg Oral Daily  . emtricitabine-tenofovir  1 tablet Oral Daily  . enoxaparin (LOVENOX) injection  40 mg Subcutaneous Q24H  . feeding supplement (RESOURCE BREEZE)  1 Container Oral BID BM  . [START ON 08/23/2014] fluconazole  100 mg Oral Weekly  . OLANZapine  5 mg Oral QHS  . pantoprazole  40 mg Oral Daily  . polyethylene glycol  17 g Oral Daily  . sennosides  5 mL Oral BID   Continuous Infusions:

## 2014-08-17 NOTE — Progress Notes (Signed)
ANTIBIOTIC CONSULT NOTE - Follow up  Pharmacy Consult for Acyclovir Indication: esophageal HSV ulcerations  Allergies  Allergen Reactions  . Bactrim Itching  . Dapsone Itching  . Orange Fruit Itching  . Peanut-Containing Drug Products Hives  . Shellfish Allergy Hives    Patient Measurements: Height: 6' (182.9 cm) Weight: 162 lb 11.2 oz (73.8 kg) IBW/kg (Calculated) : 73.1   Assessment: 38yo HIV+ female admitted after worsening RUQ abd pain x1 month.  Noted to have esophageal ulcerations on EGD.  Hx of noncompliance with initial PI-based regimen and consistently low CD4 count.  Restarted in hospital on Truvada/Tivicay.  Also to start azithro and dapsone for OI prophylaxis.  Ulcer pathology previously negative for HSV/CMV in July 2015.  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  Currie Paris, PharmD, BCPS Pager: [phone] Pharmacy: [phone] 08/17/2014 12:13 PM

## 2014-08-17 NOTE — Care Management Note (Addendum)
    Page 1 of 2   08/19/2014     12:58:20 PM CARE MANAGEMENT NOTE 08/19/2014  Patient:  [name], [name]   Account Number:  [id number]  Date Initiated:  08/11/2014  Documentation initiated by:  Leafy Kindle  Subjective/Objective Assessment:   38 yo admitted with abd pain.  Hx of HIV positive    Hepatitis B    Microcytic anemia     Action/Plan:   From home with children   Anticipated DC Date:  08/21/2014   Anticipated DC Plan:  HOME/SELF CARE         Choice offered to / List presented to:             Status of service:  In process, will continue to follow Medicare Important Message given?   (If response is "NO", the following Medicare IM given date fields will be blank) Date Medicare IM given:   Medicare IM given by:   Date Additional Medicare IM given:   Additional Medicare IM given by:    Discharge Disposition:    Per UR Regulation:  Reviewed for med. necessity/level of care/duration of stay  If discussed at [place] of Stay Meetings, dates discussed:   08/16/2014  08/18/2014    Comments:  08/19/14 [name] RN BSN [phone] [place] letter has been prepared and co-payment will be overridden to help with pt's prescriptions. The prescriptions to be covered are Dapson, Cogentin and Zyprexa.  Letter will be delivered to pt b CM on day of d/c.Marland Kitchen Pt wants to [facility] on [address]. CM has called them to confirm these medications are in-stock.  08/18/14 [name] [place] RN BSN CM was informed by attending that pt cannot be discharged without her antiviral meds. I called [facility] and they informed me that a [employer] had met with pt yesterday to appy for assistance with her meds( pt confirmed Cassandra met with her). She however still needs to turn in some financial information for the application to be complete.  Per financial counselor, pt needs to apply for Medicaid as soon as she is discharged from the hospital or can have someone do it for her while she is in the hospital.  CM  called [facility] to price the meds.  Tivicy 50 mg x 30 days = $1416.35 Truvada 200-300 x 30 days=$1473.90  CM is not able to assist pt with these meds due to the cost. Dr Megan Salon made aware. I have reinforced to pt need to follow up in a timely manner with the applications for assistance with her meds and medicaid.   08/17/14 [name] RN BSN [phone] Received call from floor RN stating pt does not have Medicaid anymore. Per [facility] notes financial counselor ha already contacted pt about reapplying for Medicaid. I went to pt's room and she confirmed the financial counselor had called her and spoken with her about reapplying.  Pt may need medication assistance at d/c. CM will continue following.   08/11/14 Madison Doctor RN,BSN, Pt receiving  IVF, analgesia, antiemetics as needed.  No CM needs identified at this time.  Will continue to follow for DC needs.

## 2014-08-17 NOTE — Progress Notes (Signed)
Patient ID: [name], female   DOB: 1975/12/26, 38 y.o.   MRN: [id number]         [place] [facility]    Date of Admission:  08/10/2014           Day 4 acyclovir  Active Problems:   Abdominal pain, acute   . acyclovir  5 mg/kg Intravenous 3 times per day  . antiseptic oral rinse  7 mL Mouth Rinse q12n4p  . chlorhexidine  15 mL Mouth Rinse BID  . dapsone  100 mg Oral Daily  . dolutegravir  50 mg Oral Daily  . emtricitabine-tenofovir  1 tablet Oral Daily  . enoxaparin (LOVENOX) injection  40 mg Subcutaneous Q24H  . feeding supplement (RESOURCE BREEZE)  1 Container Oral BID BM  . [START ON 08/23/2014] fluconazole  100 mg Oral Weekly  . pantoprazole  40 mg Oral Daily  . polyethylene glycol  17 g Oral Daily  . senna-docusate  1 tablet Oral BID    Subjective: [name] recently lost her Medicaid coverage. She states that she has called her caseworker many times but has never received a call back. She currently has no assistance to help pay for her medications. She is still having epigastric discomfort. She thinks that this may have started after she stopped taking Dexilant a few months ago. She's not having any dysphagia or and aphasia now. She has been able to swallow her Truvada and Tivicay. She has noted a small area of swelling on her left upper arm this morning but has not had any itching since starting dapsone yesterday.  Objective: Temp:  [97.7 F (36.5 C)-98.2 F (36.8 C)] 97.7 F (36.5 C) (09/02 0603) Pulse Rate:  [59-82] 59 (09/02 0603) Resp:  [18-20] 20 (09/02 0603) BP: (130-141)/(78-85) 139/78 mmHg (09/02 0603) SpO2:  [100 %] 100 % (09/02 0603)  General: She seems slightly uncomfortable and is holding her hand over her epigastrium Skin: Small area of subcutaneous swelling on left upper arm Lungs: Clear Cor: Regular S1 and S2 no murmurs Abdomen: Soft with mild epigastric tenderness   Lab Results Lab Results  Component Value Date   WBC 4.2  08/17/2014   HGB 9.3* 08/17/2014   HCT 28.4* 08/17/2014   MCV 66.4* 08/17/2014   PLT 175 08/17/2014    Lab Results  Component Value Date   CREATININE 0.56 08/17/2014   BUN <3* 08/17/2014   NA 138 08/17/2014   K 3.2* 08/17/2014   CL 100 08/17/2014   CO2 24 08/17/2014    Lab Results  Component Value Date   ALT 34 08/17/2014   AST 59* 08/17/2014   ALKPHOS 92 08/17/2014   BILITOT 0.3 08/17/2014    HIV 1 RNA Quant (copies/mL)  Date Value  01/03/2014 10300*  10/18/2013 951884*  08/10/2013 166063*     CD4 T Cell Abs (/uL)  Date Value  08/16/2014 <10*  08/11/2014 10*  01/03/2014 10*   Assessment: I will continue current antiretroviral therapy, acyclovir and prophylactic antibiotics. I will consider changing IV acyclovir to crushed oral valacyclovir tomorrow. I would recommend restarting a proton pump inhibitor, Zyprexa and Cogentin.  Plan: 1. Continue current HIV regimen and acyclovir 2. I have spoken with her nurse who will contact the case manager/social worker to see if assistance with certifying for Medicaid 3. Recommend restarting proton pump inhibitor, Zyprexa and Cogentin  Michel Bickers, MD [facility] for [place] [phone] pager   [phone] cell 08/17/2014, 9:49  AM

## 2014-08-18 ENCOUNTER — Telehealth: Payer: Self-pay

## 2014-08-18 LAB — CBC
HCT: 27.7 % — ABNORMAL LOW (ref 36.0–46.0)
Hemoglobin: 9.2 g/dL — ABNORMAL LOW (ref 12.0–15.0)
MCH: 21.6 pg — AB (ref 26.0–34.0)
MCHC: 33.2 g/dL (ref 30.0–36.0)
MCV: 65.2 fL — ABNORMAL LOW (ref 78.0–100.0)
PLATELETS: 169 10*3/uL (ref 150–400)
RBC: 4.25 MIL/uL (ref 3.87–5.11)
RDW: 21.5 % — ABNORMAL HIGH (ref 11.5–15.5)
WBC: 4.1 10*3/uL (ref 4.0–10.5)

## 2014-08-18 LAB — COMPREHENSIVE METABOLIC PANEL
ALT: 30 U/L (ref 0–35)
AST: 39 U/L — AB (ref 0–37)
Albumin: 2.9 g/dL — ABNORMAL LOW (ref 3.5–5.2)
Alkaline Phosphatase: 88 U/L (ref 39–117)
Anion gap: 13 (ref 5–15)
BUN: <3 mg/dL — ABNORMAL LOW (ref 6–23)
CALCIUM: 9.1 mg/dL (ref 8.4–10.5)
CO2: 26 mEq/L (ref 19–32)
Chloride: 99 mEq/L (ref 96–112)
Creatinine, Ser: 0.58 mg/dL (ref 0.50–1.10)
GFR calc Af Amer: >90 mL/min (ref >90)
GFR calc non Af Amer: >90 mL/min (ref >90)
Glucose, Bld: 108 mg/dL — ABNORMAL HIGH (ref 70–99)
Potassium: 3 mEq/L — ABNORMAL LOW (ref 3.7–5.3)
SODIUM: 138 meq/L (ref 137–147)
Total Bilirubin: 0.4 mg/dL (ref 0.3–1.2)
Total Protein: 6.6 g/dL (ref 6.0–8.3)

## 2014-08-18 MED ORDER — ENSURE COMPLETE PO LIQD
237.0000 mL | ORAL | Status: DC
  Administered 2014-08-18: 237 mL via ORAL

## 2014-08-18 MED ORDER — POTASSIUM CHLORIDE CRYS ER 20 MEQ PO TBCR
40.0000 meq | EXTENDED_RELEASE_TABLET | Freq: Two times a day (BID) | ORAL | Status: AC
  Administered 2014-08-18 (×2): 40 meq via ORAL
  Filled 2014-08-18 (×2): qty 2

## 2014-08-18 NOTE — Progress Notes (Signed)
Patient ID: [name], female   DOB: Sep 07, 1976, 38 y.o.   MRN: [id number]         [place] [facility] Infect[facility]    Date of Admission:  08/10/2014           Day 5 acyclovir  Active Problems:   Abdominal pain, acute   . acyclovir  5 mg/kg Intravenous 3 times per day  . antiseptic oral rinse  7 mL Mouth Rinse q12n4p  . benztropine  1 mg Oral Daily  . chlorhexidine  15 mL Mouth Rinse BID  . dapsone  100 mg Oral Daily  . dolutegravir  50 mg Oral Daily  . emtricitabine-tenofovir  1 tablet Oral Daily  . enoxaparin (LOVENOX) injection  40 mg Subcutaneous Q24H  . feeding supplement (ENSURE COMPLETE)  237 mL Oral Q24H  . [START ON 08/23/2014] fluconazole  100 mg Oral Weekly  . OLANZapine  5 mg Oral QHS  . pantoprazole  40 mg Oral BID  . polyethylene glycol  17 g Oral Daily  . potassium chloride  40 mEq Oral BID  . sennosides  5 mL Oral BID    Subjective: She is feeling a little bit better today. She's been able to eat more of her meals. She has not had any problem swallowing her pills. She does not have any pain with swallowing but continues to have some epigastric discomfort. She states that she gets more rest here in the hospital than she does at home but she does agree that she is nearing the point of discharge. The process has been started to recertify her for Medicaid and she is also in the process of applying for the state AIDS drug assistance program (ADAP).  Objective: Temp:  [98.1 F (36.7 C)-98.4 F (36.9 C)] 98.4 F (36.9 C) (09/03 0508) Pulse Rate:  [67-71] 67 (09/03 0508) Resp:  [16] 16 (09/03 0508) BP: (129-130)/(81-83) 130/83 mmHg (09/03 0508) SpO2:  [100 %] 100 % (09/03 0508)  General: She is smiling and in good spirits Skin: No rash or itching Lungs: Clear Cor: Regular S1 and S2 with no murmurs Abdomen: Soft with mild epigastric tenderness.  Lab Results Lab Results  Component Value Date   WBC 4.1 08/18/2014   HGB 9.2* 08/18/2014   HCT 27.7*  08/18/2014   MCV 65.2* 08/18/2014   PLT 169 08/18/2014    Lab Results  Component Value Date   CREATININE 0.58 08/18/2014   BUN <3* 08/18/2014   NA 138 08/18/2014   K 3.0* 08/18/2014   CL 99 08/18/2014   CO2 26 08/18/2014    HIV 1 RNA Quant (copies/mL)  Date Value  08/16/2014 95482*  01/03/2014 10300*  10/18/2013 409811*     CD4 T Cell Abs (/uL)  Date Value  08/16/2014 <10*  08/11/2014 10*  01/03/2014 10*   Assessment: I have talked to her at length today about the absolute importance of staying on her HIV and psych medications. She can see a direct correlation between being off of her psych medicines and stopping other medications and falling out of care. I'm going to obtain a voucher for Stribild so she can get a free two-month supply so she will be able to stay on effective antiretroviral therapy while her Medicaid and APAP are pending. I spoke with her inpatient case manager, [name], and she will see if she can help get her a supply of Zyprexa, Cogentin and dapsone. I will stop acyclovir now. She states that her sisters  and 3 of her closest friends to know about her HIV infection. She feels that they are supportive but admits that she rarely talks to them about how she is doing and none of them are currently aware that she has been off of medications.  Plan: 1. Continue Truvada and Tivicay but transition to Stribild upon discharge 2. Continue dapsone 3. Continue Zyprexa and Cogentin 4. Discontinue acyclovir 5. Discharge home soon with followup in my clinic next week  Michel Bickers, MD [facility] for [place] [phone] pager   [phone] cell 08/18/2014, 4:00 PM

## 2014-08-18 NOTE — Telephone Encounter (Signed)
Rod Holler, Case Manager is calling regarding patient needing HIV medications  prior to discharge.  She wants to know if we have services to offer since she no longer has medicaid. The only available service would be  ADAP. She saw our Roy and started the paperwork but it is incomplete due to needed financial verification. After this is completed it will take 2 weeks to process application.  At this point we are not able to provide medications to patient especially prior to discharge.  Case Manger can try to get patient assistance through drug company  prior to discharge.    Laverle Patter, RN

## 2014-08-18 NOTE — Progress Notes (Signed)
NUTRITION FOLLOW UP  Intervention:  -Recommend Ensure Complete once daily -Discontinued Resource Breeze -Encouraged intake of bland, easy to tolerate foods -Will continue to monitor  Nutrition Dx:   Inadequate oral intake related to nausea/vomiting/difficulty swallowing as evidenced by PO intake <75%; progressing   Goal:   Pt to meet >/= 90% of their estimated nutrition needs- progressing  Monitor:   Total protein/energy intake, labs, weights, GI profile  Assessment:   -Pt reported one month of decreased appetite d/t feelings of nausea,vomiting, and dysphagia  -Denied significant decrease in intake as pt was able to find foods she could tolerate. Diet recall indicates pt able to tolerate softer foods-applesauce, mashed potatoes, chicken salad, and hot cereals. Can tolerate meats w/extra gravies. Started protein supplement regimen one week ago; however stopped drinking as they caused her abd pain/nausea  -Reported being diagnosed with esophagitis in the past several weeks that resulted in burning/throat pain while swallowing; however this has since resolved and described new feelings of dysphagia that made eating solid foods such as bread difficult  -Endorsed weight loss, reported 30 lbs over past one month; however previous medical records indicate a 18 lb weight loss (10% body weight, significant for time frame)  -Current PO intake 10%, tolerated solid foods at breakfast. Reported feelings of nausea/emesis last night after eating chicken salad  -Was willing to try Resource Breeze for additional nutrients while appetite returns to baseline  9/03: -Diet advanced to regular on 8/28 -Has had minimal PO intake, 0-25% d/t nausea, abd pain and "burning sensation" post meals -Has been able to tolerate foods better today, pt noted improvement after MD ordered PPI. Encouraged pt to consume bland, easy to tolerate foods -[employer] supplement once, but has refused since d/t dislike of  aftertaste -Pt willing to try Ensure supplement as she consumes them at home, and believes she will now be able to tolerate it  Height: Ht Readings from Last 1 Encounters:  08/10/14 6' (1.829 m)    Weight Status:   Wt Readings from Last 1 Encounters:  08/10/14 162 lb 11.2 oz (73.8 kg)    Re-estimated needs:  Kcal: 1900-2100  Protein: 90-100 gram  Fluid: >/-1900 ml/daily   Skin: WDL  Diet Order: General   Intake/Output Summary (Last 24 hours) at 08/18/14 1257 Last data filed at 08/18/14 0509  Gross per 24 hour  Intake    600 ml  Output    900 ml  Net   -300 ml    Last BM: 9/01   Labs:   Recent Labs Lab 08/16/14 0500 08/17/14 0500 08/18/14 0520  NA 137 138 138  K 3.4* 3.2* 3.0*  CL 100 100 99  CO2 21 24 26   BUN <3* <3* <3*  CREATININE 0.58 0.56 0.58  CALCIUM 9.1 8.9 9.1  GLUCOSE 101* 111* 108*    CBG (last 3)  No results found for this basename: GLUCAP,  in the last 72 hours  Scheduled Meds: . acyclovir  5 mg/kg Intravenous 3 times per day  . antiseptic oral rinse  7 mL Mouth Rinse q12n4p  . benztropine  1 mg Oral Daily  . chlorhexidine  15 mL Mouth Rinse BID  . dapsone  100 mg Oral Daily  . dolutegravir  50 mg Oral Daily  . emtricitabine-tenofovir  1 tablet Oral Daily  . enoxaparin (LOVENOX) injection  40 mg Subcutaneous Q24H  . feeding supplement (ENSURE COMPLETE)  237 mL Oral Q24H  . [START ON 08/23/2014] fluconazole  100 mg  Oral Weekly  . OLANZapine  5 mg Oral QHS  . pantoprazole  40 mg Oral BID  . polyethylene glycol  17 g Oral Daily  . sennosides  5 mL Oral BID    Continuous Infusions:   Atlee Abide MS RD LDN Clinical Dietitian [phone]

## 2014-08-18 NOTE — Progress Notes (Signed)
Patient ID: [name], female   DOB: May 02, 1976, 38 y.o.   MRN: [id number]  [facility] PROGRESS NOTE  [name] [id number] DOB: [birth year] DOA: 08/10/2014 PCP: Philis Fendt, MD  Brief narrative:  Pt is 38 yo female with HIV, Hep B, who presented to [facility] ED with main concern of one month duration of progressively worsening RUQ abdominal pain, throbbing and intermitted, 7/10 in severity when present, non radiating, with no specific urinary concerns, no fevers, chills, no sick contacts or exposures. Pt denies any blood in urine or stool. She has not seen her PCP for the problem.   In ED, pt noted to be hemodynamically stable but in mild distress due to pain. CBC notable for Hg ~ 3, ? Blood work error. 2 U PRBC ordered for transfusion. [facility] asked to admit for further evaluation.   Assessment and Plan:   Active Problems:  Abdominal pain  - still having epigastric pain, continue PPI  - Ct abd with no specific acute findings  - appreciate ID input, pt started on ART  - started acyclovir as well  - continue fluconazole for esophageal ulcers  Acute on chronic blood loss anemia  - transfused two unit of blood 8/26: Hg trend 3.6 --> 9, initial blood work likely an error  - pt denies any active bleeding, no blood in urine or stool  - Baseline Hg ~8  Hypokalemia  - continue to supplement as K is still low  Transaminitis  - trending down  - repeat in AM  HIV  - last CD4# 10 in January 2015  - repeat CD4# 10  - started on ART by ID, appreciate input  - SW consult to assist with discharge and medication needed  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 No associated sonographic findings to suggest  acute cholecystitis.  CT ABDOMEN AND PELVIS W/C 8/26 No interval change or acute abnormality.  EGD 8/28 by Dr. Ardis Hughs There were two large ulcers in the mid to distal esophagus (each 3-4cm). Neither appeared maligant. The most distal ulcer created incomplete stricture. Biopsies were taken from proximal ulcer edge and ulcer bed. The examination to the distal esophugus was otherwise normal. 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 Benson Norway. Restart HIV medications.  Medical Consultants:   GI  ID Other Consultants:   None Anti-Infectives:   Fluconazole 8/27 -->  Acyclovir 8/30 -->  Dolutegravir -->  Truvada -->  Dapsone -->   Faye Ramsay, MD  [facility] Pager [phone]  If 7PM-7AM, please contact night-coverage [email or web address] Password [email or web address] 08/18/2014, 3:15 PM   LOS: 8 days   HPI/Subjective: No events overnight. Still with epigastric discomfort.   Objective: Filed Vitals:   08/17/14 0603 08/17/14 1446 08/17/14 2211 08/18/14 0508  BP: 139/78 140/83 129/81 130/83  Pulse: 59 74 71 67  Temp: 97.7 F (36.5 C) 98.3 F (36.8 C) 98.1 F (36.7 C) 98.4 F (36.9 C)  TempSrc: Oral Oral Oral Oral  Resp: 20 18 16 16   Height:      Weight:      SpO2: 100% 100% 100% 100%  Intake/Output Summary (Last 24 hours) at 08/18/14 1515 Last data filed at 08/18/14 0509  Gross per 24 hour  Intake    600 ml  Output    900 ml  Net   -300 ml    Exam:   General:  Pt is alert, follows commands appropriately, not in acute distress  Cardiovascular: Regular rate and rhythm, S1/S2, no murmurs, no rubs, no gallops  Respiratory: Clear to auscultation bilaterally, no wheezing, no crackles, no rhonchi  Abdomen: Soft, tender in epigastric area, non distended, bowel sounds present, no guarding  Extremities: No edema, pulses DP and PT palpable bilaterally  Neuro: Grossly nonfocal  Data  Reviewed: Basic Metabolic Panel:  Recent Labs Lab 08/13/14 0632 08/15/14 0440 08/16/14 0500 08/17/14 0500 08/18/14 0520  NA 138 136* 137 138 138  K 3.9 3.7 3.4* 3.2* 3.0*  CL 103 101 100 100 99  CO2 23 23 21 24 26   GLUCOSE 99 115* 101* 111* 108*  BUN 3* <3* <3* <3* <3*  CREATININE 0.64 0.58 0.58 0.56 0.58  CALCIUM 8.3* 8.6 9.1 8.9 9.1   Liver Function Tests:  Recent Labs Lab 08/12/14 0400 08/13/14 0632 08/15/14 0440 08/17/14 0500 08/18/14 0520  AST 39* 40* 90* 59* 39*  ALT 22 24 40* 34 30  ALKPHOS 75 85 91 92 88  BILITOT 0.4 0.4 0.3 0.3 0.4  PROT 6.0 6.0 6.0 6.5 6.6  ALBUMIN 2.6* 2.6* 2.6* 2.9* 2.9*   CBC:  Recent Labs Lab 08/12/14 0400 08/13/14 0632 08/16/14 0500 08/17/14 0500 08/18/14 0520  WBC 4.6 4.2 4.4 4.2 4.1  HGB 9.0* 9.1* 9.9* 9.3* 9.2*  HCT 27.2* 28.0* 30.1* 28.4* 27.7*  MCV 66.5* 66.8* 66.3* 66.4* 65.2*  PLT 191 187 168 175 169    Recent Results (from the past 240 hour(s))  URINE CULTURE     Status: None   Collection Time    08/10/14  5:53 PM      Result Value Ref Range Status   Specimen Description URINE, RANDOM   Final   Special Requests NONE   Final   Culture  Setup Time     Final   Value: 08/10/2014 21:57     Performed at [facility]     Final   Value: NO GROWTH     Performed at [facility]   Culture     Final   Value: NO GROWTH     Performed at [facility]   Report Status 08/11/2014 FINAL   Final  CULTURE, BLOOD (ROUTINE X 2)     Status: None   Collection Time    08/12/14 10:15 PM      Result Value Ref Range Status   Specimen Description BLOOD R ARM   Final   Special Requests BOTTLES DRAWN AEROBIC ONLY 10CC   Final   Culture  Setup Time     Final   Value: 08/13/2014 01:00     Performed at [facility]   Culture     Final   Value:        BLOOD CULTURE RECEIVED NO GROWTH TO DATE CULTURE WILL BE HELD FOR 5 DAYS BEFORE ISSUING A FINAL NEGATIVE REPORT     Performed at [facility]   Report Status PENDING   Incomplete  CULTURE, BLOOD (ROUTINE X 2)     Status: None   Collection Time    08/12/14 10:15 PM      Result Value  Ref Range Status   Specimen Description BLOOD R HAND   Final   Special Requests BOTTLES DRAWN AEROBIC ONLY 5ML   Final   Culture  Setup Time     Final   Value: 08/13/2014 01:00     Performed at [facility]   Culture     Final   Value:        BLOOD CULTURE RECEIVED NO GROWTH TO DATE CULTURE WILL BE HELD FOR 5 DAYS BEFORE ISSUING A FINAL NEGATIVE REPORT     Performed at [facility]   Report Status PENDING   Incomplete     Scheduled Meds: . acyclovir  5 mg/kg Intravenous 3 times per day  . antiseptic oral rinse  7 mL Mouth Rinse q12n4p  . benztropine  1 mg Oral Daily  . chlorhexidine  15 mL Mouth Rinse BID  . dapsone  100 mg Oral Daily  . dolutegravir  50 mg Oral Daily  . emtricitabine-tenofovir  1 tablet Oral Daily  . enoxaparin (LOVENOX) injection  40 mg Subcutaneous Q24H  . feeding supplement (ENSURE COMPLETE)  237 mL Oral Q24H  . [START ON 08/23/2014] fluconazole  100 mg Oral Weekly  . OLANZapine  5 mg Oral QHS  . pantoprazole  40 mg Oral BID  . polyethylene glycol  17 g Oral Daily  . sennosides  5 mL Oral BID   Continuous Infusions:

## 2014-08-19 LAB — BASIC METABOLIC PANEL
ANION GAP: 12 (ref 5–15)
CO2: 27 mEq/L (ref 19–32)
Calcium: 8.9 mg/dL (ref 8.4–10.5)
Chloride: 101 mEq/L (ref 96–112)
Creatinine, Ser: 0.6 mg/dL (ref 0.50–1.10)
GFR calc non Af Amer: >90 mL/min (ref >90)
Glucose, Bld: 112 mg/dL — ABNORMAL HIGH (ref 70–99)
POTASSIUM: 3.3 meq/L — AB (ref 3.7–5.3)
Sodium: 140 mEq/L (ref 137–147)

## 2014-08-19 LAB — CULTURE, BLOOD (ROUTINE X 2)
CULTURE: NO GROWTH
Culture: NO GROWTH

## 2014-08-19 LAB — CBC
HEMATOCRIT: 28.1 % — AB (ref 36.0–46.0)
HEMOGLOBIN: 9.1 g/dL — AB (ref 12.0–15.0)
MCH: 21.7 pg — ABNORMAL LOW (ref 26.0–34.0)
MCHC: 32.4 g/dL (ref 30.0–36.0)
MCV: 66.9 fL — ABNORMAL LOW (ref 78.0–100.0)
Platelets: 183 10*3/uL (ref 150–400)
RBC: 4.2 MIL/uL (ref 3.87–5.11)
RDW: 21.1 % — AB (ref 11.5–15.5)
WBC: 4.3 10*3/uL (ref 4.0–10.5)

## 2014-08-19 LAB — HLA B*5701: HLA B 5701: NEGATIVE

## 2014-08-19 MED ORDER — POTASSIUM CHLORIDE CRYS ER 20 MEQ PO TBCR
40.0000 meq | EXTENDED_RELEASE_TABLET | Freq: Four times a day (QID) | ORAL | Status: AC
  Administered 2014-08-19 (×2): 40 meq via ORAL
  Filled 2014-08-19 (×2): qty 2

## 2014-08-19 NOTE — Progress Notes (Signed)
Patient ID: [name], female   DOB: 1976-05-11, 38 y.o.   MRN: [id number]         [facility] Infectious Disease    Date of Admission:  08/10/2014       Active Problems:   Abdominal pain, acute   . antiseptic oral rinse  7 mL Mouth Rinse q12n4p  . benztropine  1 mg Oral Daily  . chlorhexidine  15 mL Mouth Rinse BID  . dapsone  100 mg Oral Daily  . dolutegravir  50 mg Oral Daily  . emtricitabine-tenofovir  1 tablet Oral Daily  . enoxaparin (LOVENOX) injection  40 mg Subcutaneous Q24H  . feeding supplement (ENSURE COMPLETE)  237 mL Oral Q24H  . [START ON 08/23/2014] fluconazole  100 mg Oral Weekly  . OLANZapine  5 mg Oral QHS  . pantoprazole  40 mg Oral BID  . polyethylene glycol  17 g Oral Daily  . sennosides  5 mL Oral BID    Subjective: [name] feels like she is ready to go home today. Is still having epigastric pain but no pain with swallowing. She received her [name] voucher yesterday afternoon and called to activate it. This will allow her to obtain a free supply of [name] upon discharge that should last until she has her Medicaid recertified or become certified for ADAP. I am also hopeful that her hospital case manager and help her obtain a short supply of Zyprexa, Cogentin and dapsone.  Objective: Temp:  [98.3 F (36.8 C)-99 F (37.2 C)] 98.3 F (36.8 C) (09/04 0705) Pulse Rate:  [68-97] 68 (09/04 0705) Resp:  [16] 16 (09/04 0705) BP: (135-142)/(80-95) 139/83 mmHg (09/04 0705) SpO2:  [100 %] 100 % (09/04 0705)  General: She is alert and in no distress talking on her phone Skin: No rash or itching Lungs: Clear Cor: Regular S1 and S2 with no murmurs Abdomen: Soft. Her epigastric tenderness seems to be minimal when she is distracted  Lab Results Lab Results  Component Value Date   WBC 4.3 08/19/2014   HGB 9.1* 08/19/2014   HCT 28.1* 08/19/2014   MCV 66.9* 08/19/2014   PLT 183 08/19/2014    Lab Results  Component Value Date   CREATININE 0.60 08/19/2014     BUN <3* 08/19/2014   NA 140 08/19/2014   K 3.3* 08/19/2014   CL 101 08/19/2014   CO2 27 08/19/2014    Assessment: I think it is reasonable to send [name] home today.  Plan: 1. Recommend discharge home on [name] 1 daily with food 2. Continue dapsone, Zyprexa and Cogentin 3. I have given her a card to return to see me in my clinic next Thursday, September 10, at [place], [place] for [place] [phone] pager   [phone] cell 08/19/2014, 9:19 AM

## 2014-08-19 NOTE — Progress Notes (Signed)
Patient ID: [name], female   DOB: 1975/12/30, 38 y.o.   MRN: [id number]  [facility] PROGRESS NOTE  [name] NFA:[id number] DOB: 05-08-1976 DOA: 08/10/2014 PCP: Philis Fendt, MD  Brief narrative:  Pt is 38 yo female with HIV, Hep B, who presented to [facility] ED with main concern of one month duration of progressively worsening RUQ abdominal pain, throbbing and intermitted, 7/10 in severity when present, non radiating, with no specific urinary concerns, no fevers, chills, no sick contacts or exposures. Pt denies any blood in urine or stool. She has not seen her PCP for the problem.   In ED, pt noted to be hemodynamically stable but in mild distress due to pain. CBC notable for Hg ~ 3, ? Blood work error. 2 U PRBC ordered for transfusion. [facility] asked to admit for further evaluation.   Assessment and Plan:   Active Problems:  Abdominal pain  - Ct abd with no specific acute findings  - appreciate ID input, pt started on ART  - started acyclovir  - continue fluconazole for esophageal ulcers  - plan on d/c in AM as we will try to get medications today and hopefully will be available in AM  Acute on chronic blood loss anemia  - transfused two unit of blood 8/26: Hg trend 3.6 --> 9, initial blood work likely an error  - pt denies any active bleeding, no blood in urine or stool  - Baseline Hg ~8  Hypokalemia  - continue to supplement as K is still low  Transaminitis  - trending down  HIV  - last CD4# 10 in January 2015  - repeat CD4# 10  - started on ART by ID, appreciate input  - SW consult to assist with discharge and medication needed  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 No associated sonographic findings to  suggest acute cholecystitis.  CT ABDOMEN AND PELVIS W/C 8/26 No interval change or acute abnormality.  EGD 8/28 by Dr. Ardis Hughs There were two large ulcers in the mid to distal esophagus (each 3-4cm). Neither appeared maligant. The most distal ulcer created incomplete stricture. Biopsies were taken from proximal ulcer edge and ulcer bed. The examination to the distal esophugus was otherwise normal. 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 Benson Norway. Restart HIV medications.  Medical Consultants:   GI  ID Other Consultants:   None Anti-Infectives:   Fluconazole 8/27 -->  Acyclovir 8/30 -->  Dolutegravir -->  Truvada -->  Dapsone -->   Faye Ramsay, MD  [facility] Pager [phone]  If 7PM-7AM, please contact night-coverage [email or web address] Password [email or web address] 08/19/2014, 3:27 PM   LOS: 9 days   HPI/Subjective: No events overnight.   Objective: Filed Vitals:   08/18/14 0508 08/18/14 1400 08/18/14 2058 08/19/14 0705  BP: 130/83 135/95 142/80 139/83  Pulse: 67 97 78 68  Temp: 98.4 F (36.9 C) 99 F (37.2 C) 98.4 F (36.9 C) 98.3 F (36.8 C)  TempSrc: Oral Oral Oral Oral  Resp: 16 16 16 16   Height:      Weight:      SpO2: 100% 100% 100% 100%  Intake/Output Summary (Last 24 hours) at 08/19/14 1527 Last data filed at 08/19/14 1259  Gross per 24 hour  Intake    600 ml  Output   1100 ml  Net   -500 ml    Exam:   General:  Pt is alert, follows commands appropriately, not in acute distress  Cardiovascular: Regular rate and rhythm, S1/S2, no murmurs, no rubs, no gallops  Respiratory: Clear to auscultation bilaterally, no wheezing, no crackles, no rhonchi  Abdomen: Soft, non tender, non distended, bowel sounds present, no guarding  Data Reviewed: Basic Metabolic Panel:  Recent Labs Lab 08/15/14 0440 08/16/14 0500 08/17/14 0500 08/18/14 0520 08/19/14 0535  NA 136* 137  138 138 140  K 3.7 3.4* 3.2* 3.0* 3.3*  CL 101 100 100 99 101  CO2 23 21 24 26 27   GLUCOSE 115* 101* 111* 108* 112*  BUN <3* <3* <3* <3* <3*  CREATININE 0.58 0.58 0.56 0.58 0.60  CALCIUM 8.6 9.1 8.9 9.1 8.9   Liver Function Tests:  Recent Labs Lab 08/13/14 0623 08/15/14 0440 08/17/14 0500 08/18/14 0520  AST 40* 90* 59* 39*  ALT 24 40* 34 30  ALKPHOS 85 91 92 88  BILITOT 0.4 0.3 0.3 0.4  PROT 6.0 6.0 6.5 6.6  ALBUMIN 2.6* 2.6* 2.9* 2.9*   CBC:  Recent Labs Lab 08/13/14 0632 08/16/14 0500 08/17/14 0500 08/18/14 0520 08/19/14 0535  WBC 4.2 4.4 4.2 4.1 4.3  HGB 9.1* 9.9* 9.3* 9.2* 9.1*  HCT 28.0* 30.1* 28.4* 27.7* 28.1*  MCV 66.8* 66.3* 66.4* 65.2* 66.9*  PLT 187 168 175 169 183     Recent Results (from the past 240 hour(s))  URINE CULTURE     Status: None   Collection Time    08/10/14  5:53 PM      Result Value Ref Range Status   Specimen Description URINE, RANDOM   Final   Special Requests NONE   Final   Culture  Setup Time     Final   Value: 08/10/2014 21:57     Performed at [place]     Final   Value: NO GROWTH     Performed at [facility]   Culture     Final   Value: NO GROWTH     Performed at [facility]   Report Status 08/11/2014 FINAL   Final  CULTURE, BLOOD (ROUTINE X 2)     Status: None   Collection Time    08/12/14 10:15 PM      Result Value Ref Range Status   Specimen Description BLOOD R ARM   Final   Special Requests BOTTLES DRAWN AEROBIC ONLY 10CC   Final   Culture  Setup Time     Final   Value: 08/13/2014 01:00     Performed at [place]     Final   Value: NO GROWTH 5 DAYS     Performed at [facility]   Report Status 08/19/2014 FINAL   Final  CULTURE, BLOOD (ROUTINE X 2)     Status: None   Collection Time    08/12/14 10:15 PM      Result Value Ref Range Status   Specimen Description BLOOD R HAND   Final   Special Requests BOTTLES DRAWN AEROBIC ONLY 5ML    Final   Culture  Setup Time     Final   Value: 08/13/2014 01:00     Performed  at [facility]     Final   Value: NO GROWTH 5 DAYS     Performed at [facility]   Report Status 08/19/2014 FINAL   Final     Scheduled Meds: . antiseptic oral rinse  7 mL Mouth Rinse q12n4p  . benztropine  1 mg Oral Daily  . chlorhexidine  15 mL Mouth Rinse BID  . dapsone  100 mg Oral Daily  . dolutegravir  50 mg Oral Daily  . emtricitabine-tenofovir  1 tablet Oral Daily  . enoxaparin (LOVENOX) injection  40 mg Subcutaneous Q24H  . feeding supplement (ENSURE COMPLETE)  237 mL Oral Q24H  . [START ON 08/23/2014] fluconazole  100 mg Oral Weekly  . OLANZapine  5 mg Oral QHS  . pantoprazole  40 mg Oral BID  . polyethylene glycol  17 g Oral Daily  . sennosides  5 mL Oral BID   Continuous Infusions:

## 2014-08-20 LAB — BASIC METABOLIC PANEL
Anion gap: 11 (ref 5–15)
BUN: 4 mg/dL — AB (ref 6–23)
CO2: 28 mEq/L (ref 19–32)
Calcium: 8.6 mg/dL (ref 8.4–10.5)
Chloride: 101 mEq/L (ref 96–112)
Creatinine, Ser: 0.62 mg/dL (ref 0.50–1.10)
GFR calc Af Amer: >90 mL/min (ref >90)
Glucose, Bld: 115 mg/dL — ABNORMAL HIGH (ref 70–99)
POTASSIUM: 3 meq/L — AB (ref 3.7–5.3)
Sodium: 140 mEq/L (ref 137–147)

## 2014-08-20 MED ORDER — EMTRICITABINE-TENOFOVIR DF 200-300 MG PO TABS: 1.0000 | ORAL_TABLET | Freq: Every day | ORAL | Status: DC

## 2014-08-20 MED ORDER — DOLUTEGRAVIR SODIUM 50 MG PO TABS: 50.0000 mg | ORAL_TABLET | Freq: Every day | ORAL | Status: DC

## 2014-08-20 MED ORDER — FLUCONAZOLE 100 MG PO TABS: 100.0000 mg | ORAL_TABLET | ORAL | Status: DC

## 2014-08-20 MED ORDER — DAPSONE 100 MG PO TABS: 100.0000 mg | ORAL_TABLET | Freq: Every day | ORAL | Status: DC

## 2014-08-20 MED ORDER — ALBUTEROL SULFATE HFA 108 (90 BASE) MCG/ACT IN AERS: 2.0000 | INHALATION_SPRAY | Freq: Four times a day (QID) | RESPIRATORY_TRACT | Status: DC | PRN

## 2014-08-20 MED ORDER — PANTOPRAZOLE SODIUM 40 MG PO TBEC: 40.0000 mg | DELAYED_RELEASE_TABLET | Freq: Two times a day (BID) | ORAL | Status: DC

## 2014-08-20 MED ORDER — OXYCODONE HCL 5 MG PO TABS
5.0000 mg | ORAL_TABLET | ORAL | Status: DC | PRN
Start: 2014-08-20 — End: 2014-11-01

## 2014-08-20 MED ORDER — BENZTROPINE MESYLATE 1 MG PO TABS
1.0000 mg | ORAL_TABLET | Freq: Every day | ORAL | Status: DC
Start: 2014-08-20 — End: 2016-11-12

## 2014-08-20 MED ORDER — OLANZAPINE 5 MG PO TABS: 5.0000 mg | ORAL_TABLET | Freq: Every day | ORAL | Status: DC

## 2014-08-20 MED ORDER — GI COCKTAIL ~~LOC~~: 30.0000 mL | Freq: Three times a day (TID) | ORAL | Status: DC | PRN

## 2014-08-20 NOTE — Progress Notes (Signed)
Pt given discharge instructions and prescriptions.  Reviewed medications, f/u appts, when to call MD--pt verbalized understanding.  Pt belongings packed, son to take home.  Paged IV team to remove PICC line.

## 2014-08-20 NOTE — Discharge Instructions (Signed)

## 2014-08-20 NOTE — Discharge Summary (Signed)
Physician Discharge Summary  [name] JKK:[id number] DOB: 03-18-1976 DOA: 08/10/2014  PCP: Philis Fendt, MD  Admit date: 08/10/2014 Discharge date: 08/20/2014  Recommendations for Outpatient Follow-up:  1. Pt will need to follow up with PCP in 2-3 weeks post discharge 2. Please obtain BMP to evaluate electrolytes and kidney function, potassium level 3. Pt received potassium 40 MEQ prior to discharge but has problems with taking PO potassium 4. Pt made aware she needs to have K level checked upon follow up  5. Please also check CBC to evaluate Hg and Hct levels  Discharge Diagnoses:  Active Problems:   Abdominal pain, acute  Discharge Condition: Stable  Diet recommendation: Heart healthy diet discussed in details   Brief narrative:  Pt is 38 yo female with HIV, Hep B, who presented to [facility] ED with main concern of one month duration of progressively worsening RUQ abdominal pain, throbbing and intermitted, 7/10 in severity when present, non radiating, with no specific urinary concerns, no fevers, chills, no sick contacts or exposures. Pt denies any blood in urine or stool. She has not seen her PCP for the problem.   In ED, pt noted to be hemodynamically stable but in mild distress due to pain. CBC notable for Hg ~ 3, ? Blood work error. 2 U PRBC ordered for transfusion. TRH asked to admit for further evaluation.   Assessment and Plan:   Active Problems:  Abdominal pain  - Ct abd with no specific acute findings  - appreciate ID input, pt started on ART  - started acyclovir  - continue fluconazole for esophageal ulcers  - pt  Feels better this AM and ready to go home  Acute on chronic blood loss anemia  - transfused two unit of blood 8/26: Hg trend 3.6 --> 9, initial blood work likely an error  - pt denies any active bleeding, no blood in urine or stool  - Baseline Hg ~8  Hypokalemia  - continue to supplement  - provided liquid K prior to discharge but has difficulty with  swallowing Transaminitis  - trending down  HIV  - last CD4# 10 in January 2015  - repeat CD4# 10  - started on ART by ID, appreciate input  - SW provided assistance with Dapsone, Congestion and zyprexa  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 No associated sonographic findings to suggest acute cholecystitis.  CT ABDOMEN AND PELVIS W/C 8/26 No interval change or acute abnormality.  EGD 8/28 by Dr. Ardis Hughs There were two large ulcers in the mid to distal esophagus (each 3-4cm). Neither appeared maligant. The most distal ulcer created incomplete stricture. Biopsies were taken from proximal ulcer edge and ulcer bed. The examination to the distal esophugus was otherwise normal. 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 Benson Norway. Restart HIV medications.  Medical Consultants:   GI  ID Other Consultants:   None Anti-Infectives:   Fluconazole 8/27 -->  Acyclovir 8/30 -->  Dolutegravir -->  Truvada -->  Dapsone -->   Discharge Exam: Filed Vitals:   08/20/14 0535  BP: 124/84  Pulse:   Temp: 97.7 F (36.5 C)  Resp: 14   Filed Vitals:   08/19/14 0705 08/19/14 1706  08/19/14 2051 08/20/14 0535  BP: 139/83 136/82 135/81 124/84  Pulse: 68 71 85   Temp: 98.3 F (36.8 C) 98.6 F (37 C) 98.2 F (36.8 C) 97.7 F (36.5 C)  TempSrc: Oral Oral Oral Oral  Resp: 16 14 16 14   Height:      Weight:      SpO2: 100% 100% 98% 100%    General: Pt is alert, follows commands appropriately, not in acute distress Cardiovascular: Regular rate and rhythm, S1/S2 +, no murmurs, no rubs, no gallops Respiratory: Clear to auscultation bilaterally, no wheezing, no crackles, no rhonchi Abdominal: Soft, non  tender, non distended, bowel sounds +, no guarding Extremities: no edema, no cyanosis, pulses palpable bilaterally DP and PT Neuro: Grossly nonfocal  Discharge Instructions  Discharge Instructions   Diet - low sodium heart healthy    Complete by:  As directed      Increase activity slowly    Complete by:  As directed             Medication List    STOP taking these medications       acetaminophen 500 MG tablet  Commonly known as:  TYLENOL     HYDROcodone-acetaminophen 7.5-325 MG per tablet  Commonly known as:  NORCO      TAKE these medications       albuterol 108 (90 BASE) MCG/ACT inhaler  Commonly known as:  PROVENTIL HFA;VENTOLIN HFA  Inhale 2 puffs into the lungs every 6 (six) hours as needed for wheezing.     benztropine 1 MG tablet  Commonly known as:  COGENTIN  Take 1 tablet (1 mg total) by mouth daily.     dapsone 100 MG tablet  Take 1 tablet (100 mg total) by mouth daily.     dolutegravir 50 MG tablet  Commonly known as:  TIVICAY  Take 1 tablet (50 mg total) by mouth daily.     emtricitabine-tenofovir 200-300 MG per tablet  Commonly known as:  TRUVADA  Take 1 tablet by mouth daily.     fluconazole 100 MG tablet  Commonly known as:  DIFLUCAN  Take 1 tablet (100 mg total) by mouth once a week.  Start taking on:  08/23/2014     gi cocktail Susp suspension  Take 30 mLs by mouth 3 (three) times daily as needed for indigestion. Shake well. Instruction: each 30 mL contains maalox 12 ml, viscous lidocaine 12 mL, donnatal 6 mL. Please call with questions [phone]     OLANZapine 5 MG tablet  Commonly known as:  ZYPREXA  Take 1 tablet (5 mg total) by mouth at bedtime.     oxyCODONE 5 MG immediate release tablet  Commonly known as:  Oxy IR/ROXICODONE  Take 1 tablet (5 mg total) by mouth every 4 (four) hours as needed for moderate pain.     pantoprazole 40 MG tablet  Commonly known as:  PROTONIX  Take 1 tablet (40 mg total) by mouth 2 (two) times daily.            Follow-up Information   Schedule an appointment as soon as possible for a visit with Philis Fendt, MD.   Specialty:  Internal Medicine   Contact information:   [place] [address] [phone]       Follow up with Faye Ramsay, MD. (As needed call my cell [phone])    Specialty:  Internal Medicine   Contact information:   [address] [place] [address]  [phone]        The results of significant diagnostics from this hospitalization (including imaging, microbiology, ancillary and laboratory) are listed below for reference.     Microbiology: Recent Results (from the past 240 hour(s))  URINE CULTURE     Status: None   Collection Time    08/10/14  5:53 PM      Result Value Ref Range Status   Specimen Description URINE, RANDOM   Final   Special Requests NONE   Final   Culture  Setup Time     Final   Value: 08/10/2014 21:57     Performed at [facility]     Final   Value: NO GROWTH     Performed at [facility]   Culture     Final   Value: NO GROWTH     Performed at [facility]   Report Status 08/11/2014 FINAL   Final  CULTURE, BLOOD (ROUTINE X 2)     Status: None   Collection Time    08/12/14 10:15 PM      Result Value Ref Range Status   Specimen Description BLOOD R ARM   Final   Special Requests BOTTLES DRAWN AEROBIC ONLY 10CC   Final   Culture  Setup Time     Final   Value: 08/13/2014 01:00     Performed at [facility]   Culture     Final   Value: NO GROWTH 5 DAYS     Performed at [facility]   Report Status 08/19/2014 FINAL   Final  CULTURE, BLOOD (ROUTINE X 2)     Status: None   Collection Time    08/12/14 10:15 PM      Result Value Ref Range Status   Specimen Description BLOOD R HAND   Final   Special Requests BOTTLES DRAWN AEROBIC ONLY 5ML   Final   Culture  Setup Time     Final   Value: 08/13/2014 01:00     Performed at  [facility]   Culture     Final   Value: NO GROWTH 5 DAYS     Performed at [facility]   Report Status 08/19/2014 FINAL   Final     Labs: Basic Metabolic Panel:  Recent Labs Lab 08/16/14 0500 08/17/14 0500 08/18/14 0520 08/19/14 0535 08/20/14 0500  NA 137 138 138 140 140  K 3.4* 3.2* 3.0* 3.3* 3.0*  CL 100 100 99 101 101  CO2 21 24 26 27 28   GLUCOSE 101* 111* 108* 112* 115*  BUN <3* <3* <3* <3* 4*  CREATININE 0.58 0.56 0.58 0.60 0.62  CALCIUM 9.1 8.9 9.1 8.9 8.6   Liver Function Tests:  Recent Labs Lab 08/15/14 0440 08/17/14 0500 08/18/14 0520  AST 90* 59* 39*  ALT 40* 34 30  ALKPHOS 91 92 88  BILITOT 0.3 0.3 0.4  PROT 6.0 6.5 6.6  ALBUMIN 2.6* 2.9* 2.9*   CBC:  Recent Labs Lab 08/16/14 0500 08/17/14 0500 08/18/14 0520 08/19/14 0535  WBC 4.4 4.2 4.1 4.3  HGB 9.9* 9.3* 9.2* 9.1*  HCT 30.1* 28.4* 27.7* 28.1*  MCV 66.3* 66.4* 65.2* 66.9*  PLT 168 175 169 183   SIGNED: Time coordinating discharge: Over 30 minutes  Faye Ramsay, MD  [facility] 08/20/2014, 9:56 AM Pager [phone]  If 7PM-7AM, please contact night-coverage [email or web address] Password [email or web address]

## 2014-08-23 ENCOUNTER — Telehealth: Payer: Self-pay | Admitting: *Deleted

## 2014-08-23 DIAGNOSIS — B20 Human immunodeficiency virus [HIV] disease: Secondary | ICD-10-CM

## 2014-08-23 MED ORDER — ELVITEG-COBIC-EMTRICIT-TENOFDF 150-150-200-300 MG PO TABS: 1.0000 | ORAL_TABLET | Freq: Every day | ORAL | Status: DC

## 2014-08-23 NOTE — Telephone Encounter (Signed)
Incorrect discharge HIV rxes sent to [facility] per Dr. Megan Salon.  Pt was to start Stribild when discharged.  Will Correct medications with [facility]. Pt to pick up rx at [facility]  Pt verbalized understanding.

## 2014-08-25 ENCOUNTER — Ambulatory Visit: Payer: Self-pay

## 2014-08-25 ENCOUNTER — Ambulatory Visit (INDEPENDENT_AMBULATORY_CARE_PROVIDER_SITE_OTHER): Payer: Self-pay | Admitting: Internal Medicine

## 2014-08-25 VITALS — BP 144/90 | HR 72 | Temp 98.0°F | Ht 72.0 in | Wt 163.5 lb

## 2014-08-25 DIAGNOSIS — B20 Human immunodeficiency virus [HIV] disease: Secondary | ICD-10-CM

## 2014-08-25 DIAGNOSIS — Z23 Encounter for immunization: Secondary | ICD-10-CM

## 2014-08-25 NOTE — Progress Notes (Signed)
Patient ID: [name], female   DOB: 1976/09/06, 38 y.o.   MRN: [id number]          Patient Active Problem List   Diagnosis Date Noted  . Abdominal pain, acute 08/10/2014    Priority: High  . Unintentional weight loss 07/10/2013    Priority: High  . Esophageal ulcer 07/10/2013    Priority: High  . Bipolar disorder 04/01/2012    Priority: High  . HIV INFECTION 02/28/2009    Priority: High  . Microcytic anemia 02/27/2009    Priority: High  . CIN I (cervical intraepithelial neoplasia I) 01/11/2014  . 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 Elevated lipase 07/10/2013  . Elevated alkaline phosphatase level 07/10/2013  . GENITAL HERPES 02/28/2009    Patient's Medications  New Prescriptions   No medications on file  Previous Medications   ALBUTEROL (PROVENTIL HFA;VENTOLIN HFA) 108 (90 BASE) MCG/ACT INHALER    Inhale 2 puffs into the lungs every 6 (six) hours as needed for wheezing.   ALUM & MAG HYDROXIDE-SIMETH (GI COCKTAIL) SUSP SUSPENSION    Take 30 mLs by mouth 3 (three) times daily as needed for indigestion. Shake well. Instruction: each 30 mL contains maalox 12 ml, viscous lidocaine 12 mL, donnatal 6 mL. Please call with questions [phone]   BENZTROPINE (COGENTIN) 1 MG TABLET    Take 1 tablet (1 mg total) by mouth daily.   DAPSONE 100 MG TABLET    Take 1 tablet (100 mg total) by mouth daily.   DEXLANSOPRAZOLE (DEXILANT) 60 MG CAPSULE    Take 60 mg by mouth.   ELVITEGRAVIR-COBICISTAT-EMTRICITABINE-TENOFOVIR (STRIBILD) 150-150-200-300 MG TABS TABLET    Take 1 tablet by mouth daily with breakfast.   EMTRICITABINE-TENOFOVIR (TRUVADA) 200-300 MG PER TABLET    Take 1 tablet by mouth daily.   FLUCONAZOLE (DIFLUCAN) 100 MG TABLET    Take 1 tablet (100 mg total) by mouth once a week.   OLANZAPINE (ZYPREXA) 5 MG TABLET    Take 1 tablet (5 mg total) by mouth at bedtime.   OXYCODONE (OXY IR/ROXICODONE) 5 MG IMMEDIATE RELEASE TABLET    Take 1 tablet (5 mg total) by mouth  every 4 (four) hours as needed for moderate pain.  Modified Medications   No medications on file  Discontinued Medications   DOLUTEGRAVIR (TIVICAY) 50 MG TABLET    Take 1 tablet (50 mg total) by mouth daily.   PANTOPRAZOLE (PROTONIX) 40 MG TABLET    Take 1 tablet (40 mg total) by mouth 2 (two) times daily.    Subjective: [name] is in for [name] hospital followup visit. [name] had been off of [name] medications recently and was admitted with recurrent esophageal ulcers and abdominal pain. [name] is feeling better now. [name] is no longer taking narcotic pain medication. [name] abdominal pain has improved with GI cocktail use. [name]'s not having any pain with swallowing. [name] appetite has improved. [name] was able to pick up [name] new prescription of Stribild 2 days ago. [name]'s had no problems tolerating it. [name] has continued to take Truvada since discharge. [name] is also back on [name] Zyprexa.  Review of Systems: Pertinent items are noted in HPI.  Past Medical History  Diagnosis Date  . HIV positive   . Hepatitis B     /E-chart  . Microcytic anemia     h/o per E-chart  . 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 Noncompliance with medication regimen     /e-chart  .  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 Pyelonephritis     h/o per E-chart  . Thyroid disease   . Depression   . Asthma     inhaler 2xday    History  Substance Use Topics  . Smoking status: Never Smoker   . Smokeless tobacco: Never Used  . Alcohol Use: Yes     Comment: occ    Family History  Problem Relation Age of Onset  . Cancer Mother   . Diabetes Mother   . Diabetes Father   . Heart disease Father   . Cancer Sister   . Diabetes Sister   . Asthma Son     Allergies  Allergen Reactions  . Bactrim Itching  . Dapsone Itching  . Orange Fruit Itching  . Peanut-Containing Drug Products Hives  . Shellfish Allergy Hives    Objective: Temp: 98 F (36.7 C)  (09/10 1147) Temp src: Oral (09/10 1147) BP: 144/90 mmHg (09/10 1147) Pulse Rate: 72 (09/10 1147) Body mass index is 22.17 kg/(m^2).  General: [name] is smiling and in good spirits Oral: No oropharyngeal lesions Skin:  No rash Lungs: Clear Cor: Regular S1 and S2 with no murmurs Abdomen: Soft and nontender Mood and affect: Bright and appropriate  Lab Results Lab Results  Component Value Date   WBC 4.3 08/19/2014   HGB 9.1* 08/19/2014   HCT 28.1* 08/19/2014   MCV 66.9* 08/19/2014   PLT 183 08/19/2014    Lab Results  Component Value Date   CREATININE 0.62 08/20/2014   BUN 4* 08/20/2014   NA 140 08/20/2014   K 3.0* 08/20/2014   CL 101 08/20/2014   CO2 28 08/20/2014    Lab Results  Component Value Date   ALT 30 08/18/2014   AST 39* 08/18/2014   ALKPHOS 88 08/18/2014   BILITOT 0.4 08/18/2014    Lab Results  Component Value Date   CHOL 155 01/03/2014   HDL 33* 01/03/2014   LDLCALC 58 01/03/2014   TRIG 318* 01/03/2014   CHOLHDL 4.7 01/03/2014    Lab Results HIV 1 RNA Quant (copies/mL)  Date Value  08/16/2014 95482*  01/03/2014 10300*  10/18/2013 924268*     CD4 T Cell Abs (/uL)  Date Value  08/16/2014 <10*  08/11/2014 10*  01/03/2014 10*     Assessment: [name] is bipolar disorder and depression continue to make adherence to clinic visits and medications difficult. [name] does seem recommitted to taking [name] medication. I will have [name] continue Stribild and stop Truvada and now. [name] Medicaid and/or ADAP applications are pending.  Plan: 1. Continue Stribild 2. Stop Truvada 3. Continue Zyprexa 4. Followup in 3 weeks   Michel Bickers, MD [facility] for [place] [phone] pager   [phone] cell 08/25/2014, 1:48 PM

## 2014-08-29 LAB — HIV-1 GENOTYPR PLUS

## 2014-09-14 ENCOUNTER — Ambulatory Visit: Payer: Medicaid Other | Admitting: Internal Medicine

## 2014-09-19 ENCOUNTER — Other Ambulatory Visit: Payer: Self-pay | Admitting: *Deleted

## 2014-09-19 DIAGNOSIS — B20 Human immunodeficiency virus [HIV] disease: Secondary | ICD-10-CM

## 2014-09-19 MED ORDER — ELVITEG-COBIC-EMTRICIT-TENOFDF 150-150-200-300 MG PO TABS: 1.0000 | ORAL_TABLET | Freq: Every day | ORAL | Status: DC

## 2014-10-03 ENCOUNTER — Telehealth: Payer: Self-pay | Admitting: *Deleted

## 2014-10-03 NOTE — Telephone Encounter (Signed)
Please overbook [name] to see me tomorrow at 1:30 pm.

## 2014-10-03 NOTE — Telephone Encounter (Signed)
Patient called and asked if Dr Megan Salon will give her a refill on her pain medication. She advised she is having pain when she swallows and just beneath her rib cage when she takes a deep breath. She advised that she has called her PCP who will not refill before 10/24/14. I advised her the doctor will not usually fill if she is getting it from another doctor. She asked that I ask as she saw him in September about ulcers in her throat and he knows why she needs medication. Advised her will ask and give her a call once he answers.

## 2014-10-03 NOTE — Telephone Encounter (Signed)
Per Dr Megan Salon patient called and informed of appt and she accepted and will be here.

## 2014-10-04 ENCOUNTER — Ambulatory Visit (INDEPENDENT_AMBULATORY_CARE_PROVIDER_SITE_OTHER): Payer: Medicaid Other | Admitting: Internal Medicine

## 2014-10-04 ENCOUNTER — Other Ambulatory Visit: Payer: Self-pay | Admitting: *Deleted

## 2014-10-04 VITALS — BP 128/82 | HR 99 | Temp 98.3°F | Wt 158.5 lb

## 2014-10-04 DIAGNOSIS — K5900 Constipation, unspecified: Secondary | ICD-10-CM

## 2014-10-04 DIAGNOSIS — B20 Human immunodeficiency virus [HIV] disease: Secondary | ICD-10-CM

## 2014-10-04 LAB — COMPREHENSIVE METABOLIC PANEL
ALBUMIN: 4 g/dL (ref 3.5–5.2)
ALK PHOS: 86 U/L (ref 39–117)
ALT: 10 U/L (ref 0–35)
AST: 26 U/L (ref 0–37)
BUN: 3 mg/dL — AB (ref 6–23)
CO2: 26 mEq/L (ref 19–32)
Calcium: 8.9 mg/dL (ref 8.4–10.5)
Chloride: 105 mEq/L (ref 96–112)
Creat: 0.55 mg/dL (ref 0.50–1.10)
Glucose, Bld: 92 mg/dL (ref 70–99)
POTASSIUM: 3.9 meq/L (ref 3.5–5.3)
Sodium: 141 mEq/L (ref 135–145)
Total Bilirubin: 0.5 mg/dL (ref 0.2–1.2)
Total Protein: 6.9 g/dL (ref 6.0–8.3)

## 2014-10-04 MED ORDER — BISACODYL 5 MG PO TBEC: 5.0000 mg | DELAYED_RELEASE_TABLET | Freq: Every day | ORAL | Status: DC | PRN

## 2014-10-04 NOTE — Progress Notes (Signed)
Patient ID: [name], female   DOB: [birth year], 38 y.o.   MRN: [id number]          Patient Active Problem List   Diagnosis Date Noted  . Abdominal pain, acute 08/10/2014    Priority: High  . Unintentional weight loss 07/10/2013    Priority: High  . Esophageal ulcer 07/10/2013    Priority: High  . Bipolar disorder 04/01/2012    Priority: High  . Human immunodeficiency virus (HIV) disease 02/28/2009    Priority: High  . Microcytic anemia 02/27/2009    Priority: High  . Constipation 10/04/2014  . CIN I (cervical intraepithelial neoplasia I) 01/11/2014  . 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 Elevated lipase 07/10/2013  . Elevated alkaline phosphatase level 07/10/2013  . GENITAL HERPES 02/28/2009    Patient's Medications  New Prescriptions   BISACODYL (DULCOLAX) 5 MG EC TABLET    Take 1 tablet (5 mg total) by mouth daily as needed for moderate constipation.  Previous Medications   ALBUTEROL (PROVENTIL HFA;VENTOLIN HFA) 108 (90 BASE) MCG/ACT INHALER    Inhale 2 puffs into the lungs every 6 (six) hours as needed for wheezing.   ALUM & MAG HYDROXIDE-SIMETH (GI COCKTAIL) SUSP SUSPENSION    Take 30 mLs by mouth 3 (three) times daily as needed for indigestion. Shake well. Instruction: each 30 mL contains maalox 12 ml, viscous lidocaine 12 mL, donnatal 6 mL. Please call with questions [phone]   BENZTROPINE (COGENTIN) 1 MG TABLET    Take 1 tablet (1 mg total) by mouth daily.   DAPSONE 100 MG TABLET    Take 1 tablet (100 mg total) by mouth daily.   DEXLANSOPRAZOLE (DEXILANT) 60 MG CAPSULE    Take 60 mg by mouth.   ELVITEGRAVIR-COBICISTAT-EMTRICITABINE-TENOFOVIR (STRIBILD) 150-150-200-300 MG TABS TABLET    Take 1 tablet by mouth daily with breakfast.   FLUCONAZOLE (DIFLUCAN) 100 MG TABLET    Take 1 tablet (100 mg total) by mouth once a week.   OLANZAPINE (ZYPREXA) 5 MG TABLET    Take 1 tablet (5 mg total) by mouth at bedtime.   OXYCODONE (OXY IR/ROXICODONE) 5 MG  IMMEDIATE RELEASE TABLET    Take 1 tablet (5 mg total) by mouth every 4 (four) hours as needed for moderate pain.  Modified Medications   No medications on file  Discontinued Medications   No medications on file    Subjective: Azaylah seen on a work in basis. She's been having more problems with epigastric discomfort. She has been taking oxycodone and has become constipated. She's not having any difficulty swallowing. She states that she has been taking her Stribild and has not missed doses. She stopped taking her dapsone because he developed some slightly pruritic skin lesions. She has not gone back to [place] to reenter counseling. She is taking her Zyprexa. Review of Systems: Pertinent items are noted in HPI.  Past Medical History  Diagnosis Date  . HIV positive   . Hepatitis B     /E-chart  . Microcytic anemia     h/o per E-chart  . 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 Noncompliance with medication regimen     /e-chart  . 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 Pyelonephritis     h/o per E-chart  . Thyroid disease   . Depression   . Asthma     inhaler 2xday    History  Substance  Use Topics  . Smoking status: Never Smoker   . Smokeless tobacco: Never Used  . Alcohol Use: Yes     Comment: occ    Family History  Problem Relation Age of Onset  . Cancer Mother   . Diabetes Mother   . Diabetes Father   . Heart disease Father   . Cancer Sister   . Diabetes Sister   . Asthma Son     Allergies  Allergen Reactions  . Bactrim Itching  . Dapsone Itching  . Orange Fruit Itching  . Peanut-Containing Drug Products Hives  . Shellfish Allergy Hives    Objective: Temp: 98.3 F (36.8 C) (10/20 1413) Temp Source: Oral (10/20 1413) BP: 128/82 mmHg (10/20 1413) Pulse Rate: 99 (10/20 1413) Body mass index is 21.49 kg/(m^2).  General: She is tearful Oral: No oropharyngeal lesions Skin: A few  areas of hyperpigmentation or that she says is the pruritic rash Lungs: Clear Cor: Regular S1 and S2 with no murmurs Abdomen: Soft with mild epigastric tenderness. Quiet bowel sounds.   Lab Results Lab Results  Component Value Date   WBC 4.3 08/19/2014   HGB 9.1* 08/19/2014   HCT 28.1* 08/19/2014   MCV 66.9* 08/19/2014   PLT 183 08/19/2014    Lab Results  Component Value Date   CREATININE 0.62 08/20/2014   BUN 4* 08/20/2014   NA 140 08/20/2014   K 3.0* 08/20/2014   CL 101 08/20/2014   CO2 28 08/20/2014    Lab Results  Component Value Date   ALT 30 08/18/2014   AST 39* 08/18/2014   ALKPHOS 88 08/18/2014   BILITOT 0.4 08/18/2014    Lab Results  Component Value Date   CHOL 155 01/03/2014   HDL 33* 01/03/2014   LDLCALC 58 01/03/2014   TRIG 318* 01/03/2014   CHOLHDL 4.7 01/03/2014    Lab Results HIV 1 RNA Quant (copies/mL)  Date Value  08/16/2014 95482*  01/03/2014 10300*  10/18/2013 169450*     CD4 T Cell Abs (/uL)  Date Value  08/16/2014 <10*  08/11/2014 10*  01/03/2014 10*     Assessment: I suspect that some of her epigastric pain may be related to constipation. I suggested she stop taking the oxycodone and try Dulcolax.  I will check lab work today and have her continue [place].  She will see Curley Spice, our mental health counselor, today and we will try to get her back to [facility] soon.  Plan: 1. Continue Stribild 2. Stop oxycodone 3. Start Dulcolax 4. Mental health counseling 5. Check lab work today 6. Followup in 3 weeks   Michel Bickers, MD [facility] for [place] [phone] pager   [phone] cell 10/04/2014, 2:39 PM

## 2014-10-05 LAB — CBC
HEMATOCRIT: 29.2 % — AB (ref 36.0–46.0)
Hemoglobin: 9.4 g/dL — ABNORMAL LOW (ref 12.0–15.0)
MCH: 22.2 pg — ABNORMAL LOW (ref 26.0–34.0)
MCHC: 32.2 g/dL (ref 30.0–36.0)
MCV: 68.9 fL — ABNORMAL LOW (ref 78.0–100.0)
PLATELETS: 244 10*3/uL (ref 150–400)
RBC: 4.24 MIL/uL (ref 3.87–5.11)
RDW: 24 % — AB (ref 11.5–15.5)
WBC: 3.3 10*3/uL — AB (ref 4.0–10.5)

## 2014-10-05 LAB — T-HELPER CELL (CD4) - (RCID CLINIC ONLY)
CD4 % Helper T Cell: 2 % — ABNORMAL LOW (ref 33–55)
CD4 T Cell Abs: <10 /uL — ABNORMAL LOW (ref 400–2700)

## 2014-10-05 LAB — HIV-1 RNA QUANT-NO REFLEX-BLD
HIV 1 RNA Quant: 555073 copies/mL — ABNORMAL HIGH (ref <20)
HIV-1 RNA QUANT, LOG: 5.74 {Log} — AB (ref <1.30)

## 2014-10-10 ENCOUNTER — Other Ambulatory Visit: Payer: Self-pay | Admitting: Licensed Clinical Social Worker

## 2014-10-10 MED ORDER — VALACYCLOVIR HCL 1 G PO TABS: 1000.0000 mg | ORAL_TABLET | Freq: Three times a day (TID) | ORAL | Status: DC

## 2014-10-10 MED ORDER — FLUCONAZOLE 100 MG PO TABS: 100.0000 mg | ORAL_TABLET | ORAL | Status: DC

## 2014-10-25 ENCOUNTER — Telehealth: Payer: Self-pay | Admitting: *Deleted

## 2014-10-25 ENCOUNTER — Encounter: Payer: Self-pay | Admitting: *Deleted

## 2014-10-25 NOTE — Telephone Encounter (Signed)
Needs f/u appt w/ Dr. Megan Salon.  Will send her a letter.

## 2014-11-01 ENCOUNTER — Encounter (HOSPITAL_COMMUNITY): Payer: Self-pay | Admitting: *Deleted

## 2014-11-01 ENCOUNTER — Emergency Department (HOSPITAL_COMMUNITY)
Admission: EM | Admit: 2014-11-01 | Discharge: 2014-11-01 | Disposition: A | Discharge status: Home / Self Care | Payer: Medicaid Other | Attending: Emergency Medicine | Admitting: Emergency Medicine

## 2014-11-01 DIAGNOSIS — J45909 Unspecified asthma, uncomplicated: Secondary | ICD-10-CM | POA: Diagnosis not present

## 2014-11-01 DIAGNOSIS — R112 Nausea with vomiting, unspecified: Secondary | ICD-10-CM | POA: Diagnosis present

## 2014-11-01 DIAGNOSIS — Z21 Asymptomatic human immunodeficiency virus [HIV] infection status: Secondary | ICD-10-CM | POA: Diagnosis not present

## 2014-11-01 DIAGNOSIS — Z8619 Personal history of other infectious and parasitic diseases: Secondary | ICD-10-CM | POA: Diagnosis not present

## 2014-11-01 DIAGNOSIS — Z8639 Personal history of other endocrine, nutritional and metabolic disease: Secondary | ICD-10-CM | POA: Diagnosis not present

## 2014-11-01 DIAGNOSIS — Z8742 Personal history of other diseases of the female genital tract: Secondary | ICD-10-CM | POA: Insufficient documentation

## 2014-11-01 DIAGNOSIS — R5383 Other fatigue: Secondary | ICD-10-CM | POA: Insufficient documentation

## 2014-11-01 DIAGNOSIS — Z3202 Encounter for pregnancy test, result negative: Secondary | ICD-10-CM | POA: Diagnosis not present

## 2014-11-01 DIAGNOSIS — R197 Diarrhea, unspecified: Secondary | ICD-10-CM | POA: Insufficient documentation

## 2014-11-01 DIAGNOSIS — Z862 Personal history of diseases of the blood and blood-forming organs and certain disorders involving the immune mechanism: Secondary | ICD-10-CM | POA: Diagnosis not present

## 2014-11-01 DIAGNOSIS — R109 Unspecified abdominal pain: Secondary | ICD-10-CM | POA: Insufficient documentation

## 2014-11-01 DIAGNOSIS — Z79899 Other long term (current) drug therapy: Secondary | ICD-10-CM | POA: Insufficient documentation

## 2014-11-01 DIAGNOSIS — Z8701 Personal history of pneumonia (recurrent): Secondary | ICD-10-CM | POA: Insufficient documentation

## 2014-11-01 DIAGNOSIS — Z729 Problem related to lifestyle, unspecified: Secondary | ICD-10-CM | POA: Insufficient documentation

## 2014-11-01 LAB — CBC WITH DIFFERENTIAL/PLATELET
Basophils Absolute: 0 10*3/uL (ref 0.0–0.1)
Basophils Relative: 0 % (ref 0–1)
Eosinophils Absolute: 0 10*3/uL (ref 0.0–0.7)
Eosinophils Relative: 1 % (ref 0–5)
HCT: 30.3 % — ABNORMAL LOW (ref 36.0–46.0)
Hemoglobin: 9.7 g/dL — ABNORMAL LOW (ref 12.0–15.0)
Lymphocytes Relative: 10 % — ABNORMAL LOW (ref 12–46)
Lymphs Abs: 0.5 10*3/uL — ABNORMAL LOW (ref 0.7–4.0)
MCH: 22.3 pg — ABNORMAL LOW (ref 26.0–34.0)
MCHC: 32 g/dL (ref 30.0–36.0)
MCV: 69.7 fL — ABNORMAL LOW (ref 78.0–100.0)
Monocytes Absolute: 0.3 10*3/uL (ref 0.1–1.0)
Monocytes Relative: 6 % (ref 3–12)
Neutro Abs: 3.8 10*3/uL (ref 1.7–7.7)
Neutrophils Relative %: 83 % — ABNORMAL HIGH (ref 43–77)
Platelets: 308 10*3/uL (ref 150–400)
RBC: 4.35 MIL/uL (ref 3.87–5.11)
RDW: 20.6 % — ABNORMAL HIGH (ref 11.5–15.5)
WBC: 4.6 10*3/uL (ref 4.0–10.5)

## 2014-11-01 LAB — COMPREHENSIVE METABOLIC PANEL
ALT: 9 U/L (ref 0–35)
AST: 17 U/L (ref 0–37)
Albumin: 3.4 g/dL — ABNORMAL LOW (ref 3.5–5.2)
Alkaline Phosphatase: 65 U/L (ref 39–117)
Anion gap: 18 — ABNORMAL HIGH (ref 5–15)
BUN: 9 mg/dL (ref 6–23)
CO2: 21 mEq/L (ref 19–32)
Calcium: 9.8 mg/dL (ref 8.4–10.5)
Chloride: 97 mEq/L (ref 96–112)
Creatinine, Ser: 0.76 mg/dL (ref 0.50–1.10)
GFR calc Af Amer: >90 mL/min (ref >90)
GFR calc non Af Amer: >90 mL/min (ref >90)
Glucose, Bld: 86 mg/dL (ref 70–99)
Potassium: 3.3 mEq/L — ABNORMAL LOW (ref 3.7–5.3)
Sodium: 136 mEq/L — ABNORMAL LOW (ref 137–147)
Total Bilirubin: 0.7 mg/dL (ref 0.3–1.2)
Total Protein: 7.9 g/dL (ref 6.0–8.3)

## 2014-11-01 LAB — PREGNANCY, URINE: Preg Test, Ur: NEGATIVE

## 2014-11-01 MED ORDER — POTASSIUM CHLORIDE CRYS ER 20 MEQ PO TBCR
40.0000 meq | EXTENDED_RELEASE_TABLET | Freq: Once | ORAL | Status: AC
  Administered 2014-11-01: 40 meq via ORAL
  Filled 2014-11-01: qty 2

## 2014-11-01 MED ORDER — POTASSIUM CHLORIDE CRYS ER 20 MEQ PO TBCR
40.0000 meq | EXTENDED_RELEASE_TABLET | Freq: Once | ORAL | Status: DC
  Filled 2014-11-01: qty 2

## 2014-11-01 MED ORDER — FAMOTIDINE 20 MG PO TABS: 20.0000 mg | ORAL_TABLET | Freq: Two times a day (BID) | ORAL | Status: DC

## 2014-11-01 MED ORDER — CIPROFLOXACIN HCL 500 MG PO TABS: 500.0000 mg | ORAL_TABLET | Freq: Two times a day (BID) | ORAL | Status: DC

## 2014-11-01 MED ORDER — SODIUM CHLORIDE 0.9 % IV BOLUS (SEPSIS)
1000.0000 mL | Freq: Once | INTRAVENOUS | Status: AC
  Administered 2014-11-01: 1000 mL via INTRAVENOUS

## 2014-11-01 NOTE — ED Notes (Signed)
Pt reports n/v/d since last Tuesday, 1 week after returning from [place] Denies abd pain.

## 2014-11-01 NOTE — ED Notes (Signed)
Bed: WA16 Expected date:  Expected time:  Means of arrival:  Comments: EMS-N/V 

## 2014-11-01 NOTE — ED Provider Notes (Signed)
CSN: [id number]     Arrival date & time 11/01/14  3716 History   First MD Initiated Contact with Patient 11/01/14 [phone]     Chief Complaint  Patient presents with  . Nausea  . Emesis  . Diarrhea     (Consider location/radiation/quality/duration/timing/severity/associated sxs/prior Treatment) HPI Pt is a 38yo female with hx of HIV positive, Hepatitis B, microcytic anemia, noncompliance with medication, acute psychosis, thryoid disease, and asthma, presenting to ED with reports of n/v/d since Tuesday, 10/25/14, a day after returning from the [place]  Pt states she has not vomited since Sunday, 11/15 after taking her zofran but has had about 7 episodes of watery diarrhea today. Denies blood or mucous in stool. States she has taken imodium w/o relief.  Denies fever or chills. Does report mild fatigue. Denies sick contacts. Denies recent use of antibiotics.   Past Medical History  Diagnosis Date  . HIV positive   . Hepatitis B     /E-chart  . Microcytic anemia     h/o per E-chart  . 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 Noncompliance with medication regimen     /e-chart  . 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 Pyelonephritis     h/o per E-chart  . Thyroid disease   . Depression   . Asthma     inhaler 2xday   Past Surgical History  Procedure Laterality Date  . Thyroid surgery    . Thyroid lobectomy  08/2002    left & isthmectomy; for benign thyroid adenoma/E-chart  . Tubal ligation  07/2002    /E-chart  . Esophagogastroduodenoscopy N/A 07/13/2013    Procedure: ESOPHAGOGASTRODUODENOSCOPY (EGD);  Surgeon: Beryle Beams, MD;  Location: [facility];  Service: Endoscopy;  Laterality: N/A;  . Esophagogastroduodenoscopy N/A 08/12/2014    Procedure: ESOPHAGOGASTRODUODENOSCOPY (EGD);  Surgeon: Milus Banister, MD;  Location: [facility];  Service: Endoscopy;  Laterality: N/A;   Family History   Problem Relation Age of Onset  . Cancer Mother   . Diabetes Mother   . Diabetes Father   . Heart disease Father   . Cancer Sister   . Diabetes Sister   . Asthma Son    History  Substance Use Topics  . Smoking status: Never Smoker   . Smokeless tobacco: Never Used  . Alcohol Use: Yes     Comment: occ   OB History    No data available     Review of Systems  Constitutional: Positive for appetite change and fatigue. Negative for fever, chills and diaphoresis.  Respiratory: Negative for cough and shortness of breath.   Cardiovascular: Negative for chest pain and palpitations.  Gastrointestinal: Positive for nausea, vomiting, abdominal pain and diarrhea. Negative for constipation and blood in stool.  Genitourinary: Negative for dysuria, urgency, frequency, hematuria, flank pain, decreased urine volume and pelvic pain.  Musculoskeletal: Negative for myalgias and back pain.  All other systems reviewed and are negative.     Allergies  Bactrim; Dapsone; Orange fruit; Peanut-containing drug products; Shellfish allergy; and Sulfa antibiotics  Home Medications   Prior to Admission medications   Medication Sig Start Date End Date Taking? Authorizing Provider  albuterol (PROVENTIL HFA;VENTOLIN HFA) 108 (90 BASE) MCG/ACT inhaler Inhale 2 puffs into the lungs every 6 (six) hours as needed for wheezing. 08/20/14  Yes Theodis Blaze, MD  azithromycin (ZITHROMAX) 200 MG/5ML suspension Take 1,200 mg by mouth once a  week. (30 ml)   Yes Historical Provider, MD  benztropine (COGENTIN) 1 MG tablet Take 1 tablet (1 mg total) by mouth daily. 08/20/14  Yes Theodis Blaze, MD  bisacodyl (DULCOLAX) 5 MG EC tablet Take 1 tablet (5 mg total) by mouth daily as needed for moderate constipation. 10/04/14  Yes Michel Bickers, MD  dexlansoprazole (DEXILANT) 60 MG capsule Take 60 mg by mouth daily.    Yes Historical Provider, MD  elvitegravir-cobicistat-emtricitabine-tenofovir (STRIBILD) 150-150-200-300 MG TABS  tablet Take 1 tablet by mouth daily with breakfast. 09/19/14  Yes Truman Hayward, MD  fluconazole (DIFLUCAN) 100 MG tablet Take 1 tablet (100 mg total) by mouth once a week. 10/10/14  Yes Michel Bickers, MD  Multiple Vitamins-Minerals (ADULT GUMMY) CHEW Chew 2 capsules by mouth daily.   Yes Historical Provider, MD  OLANZapine (ZYPREXA) 5 MG tablet Take 1 tablet (5 mg total) by mouth at bedtime. 08/20/14  Yes Theodis Blaze, MD  ondansetron (ZOFRAN) 8 MG tablet Take 8 mg by mouth every 8 (eight) hours as needed for nausea or vomiting.   Yes Historical Provider, MD  valACYclovir (VALTREX) 1000 MG tablet Take 1 tablet (1,000 mg total) by mouth 3 (three) times daily. 10/10/14  Yes Michel Bickers, MD  Alum & Mag Hydroxide-Simeth (GI COCKTAIL) SUSP suspension Take 30 mLs by mouth 3 (three) times daily as needed for indigestion. Shake well. Instruction: each 30 mL contains maalox 12 ml, viscous lidocaine 12 mL, donnatal 6 mL. Please call with questions [phone] 08/20/14   Theodis Blaze, MD  ciprofloxacin (CIPRO) 500 MG tablet Take 1 tablet (500 mg total) by mouth 2 (two) times daily. One po bid x 7 days 11/01/14   Noland Fordyce, PA-C  dapsone 100 MG tablet Take 1 tablet (100 mg total) by mouth daily. 08/20/14   Theodis Blaze, MD   BP 120/74 mmHg  Pulse 95  Temp(Src) 97.2 F (36.2 C) (Oral)  Resp 14  SpO2 100%  LMP 09/30/2014 Physical Exam  Constitutional: She appears well-developed and well-nourished.  Pt lying in exam bed, appears mildly fatigued and uncomfortable.  HENT:  Head: Normocephalic and atraumatic.  Eyes: Conjunctivae are normal. No scleral icterus.  Neck: Normal range of motion.  Cardiovascular: Normal rate, regular rhythm and normal heart sounds.   Pulmonary/Chest: Effort normal and breath sounds normal. No respiratory distress. She has no wheezes. She has no rales. She exhibits no tenderness.  Abdominal: Soft. Bowel sounds are normal. She exhibits no distension and no mass. There is  tenderness. There is no rebound and no guarding.  Soft, non-distended. Mild diffuse tenderness. No rebound, guarding, or mass. No CVAT  Musculoskeletal: Normal range of motion.  Neurological: She is alert.  Skin: Skin is warm and dry. She is not diaphoretic.  Nursing note and vitals reviewed.   ED Course  Procedures (including critical care time) Labs Review Labs Reviewed  CBC WITH DIFFERENTIAL - Abnormal; Notable for the following:    Hemoglobin 9.7 (*)    HCT 30.3 (*)    MCV 69.7 (*)    MCH 22.3 (*)    RDW 20.6 (*)    Neutrophils Relative % 83 (*)    Lymphocytes Relative 10 (*)    Lymphs Abs 0.5 (*)    All other components within normal limits  COMPREHENSIVE METABOLIC PANEL - Abnormal; Notable for the following:    Sodium 136 (*)    Potassium 3.3 (*)    Albumin 3.4 (*)    Anion  gap 18 (*)    All other components within normal limits  STOOL CULTURE  PREGNANCY, URINE  POC URINE PREG, ED    Imaging Review No results found.   EKG Interpretation None      MDM   Final diagnoses:  Nausea vomiting and diarrhea    Pt presenting to ED with reports of n/v/d after recent trip to [place] Symptoms started last week. Pt is HIV positive and non-compliant with her medications. Pt appears mildly fatigued and unwell, but is afebrile.  Hemodynamically stable.   1:02 PM Pt was able to provide urine sample for urine preg. Pt has not been able to provide stool sample. Pt currently texting on phone. Appears to be feeling better compared to when pt first arrived in ED. Pt states she is feeling better.  Labs: mild hypokalemia, otherwise, unremarkable.   Discussed pt with Dr. Doy Mince, pt was able to keep down PO fluids. Will discharge home and tx empirically for traveler's diarrhea with 7 day course of ciprofloxacin.  Home care instructions provided. Advised to f/u with PCP for recheck of symptoms as needed. Return precautions provided. Pt verbalized understanding and agreement  with tx plan.      Noland Fordyce, PA-C 11/01/14 [place], MD 11/04/14 1340

## 2014-11-01 NOTE — ED Notes (Signed)
Patient states she is still unable to urine

## 2014-11-01 NOTE — ED Notes (Signed)
Pt given applesauce to take potassium pills with. Also given ginger ale and saltine crackers with fluid challenge and at pt request.

## 2014-11-14 ENCOUNTER — Ambulatory Visit: Payer: Medicaid Other

## 2014-12-02 ENCOUNTER — Telehealth: Payer: Self-pay | Admitting: Gastroenterology

## 2014-12-02 NOTE — Telephone Encounter (Signed)
i agree she needs to get back in with ID.  I'm also happy to see her next avail ROV.

## 2014-12-02 NOTE — Telephone Encounter (Signed)
Pt aware and does not wish to follow up at this time

## 2014-12-02 NOTE — Telephone Encounter (Signed)
Dr Ardis Hughs the pt states she is still having epigastric discomfort.  Per your last note the pt needed to follow up with infectious disease.  She did have an appt but has failed to follow up as requested at the last office visit.  I advised the pt to get back in with infectious disease and I will forward to Dr Ardis Hughs for review.  Please advise

## 2014-12-05 ENCOUNTER — Encounter (HOSPITAL_COMMUNITY): Payer: Self-pay | Admitting: Emergency Medicine

## 2014-12-05 ENCOUNTER — Emergency Department (HOSPITAL_COMMUNITY): Payer: Medicaid Other

## 2014-12-05 ENCOUNTER — Inpatient Hospital Stay (HOSPITAL_COMMUNITY)
Admission: EM | Admit: 2014-12-05 | Discharge: 2014-12-12 | DRG: 974 | Disposition: A | Discharge status: Home / Self Care | Payer: Medicaid Other | Attending: Internal Medicine | Admitting: Internal Medicine

## 2014-12-05 DIAGNOSIS — K222 Esophageal obstruction: Secondary | ICD-10-CM

## 2014-12-05 DIAGNOSIS — Z91010 Allergy to peanuts: Secondary | ICD-10-CM

## 2014-12-05 DIAGNOSIS — F319 Bipolar disorder, unspecified: Secondary | ICD-10-CM | POA: Diagnosis present

## 2014-12-05 DIAGNOSIS — F502 Bulimia nervosa: Secondary | ICD-10-CM

## 2014-12-05 DIAGNOSIS — R1111 Vomiting without nausea: Secondary | ICD-10-CM

## 2014-12-05 DIAGNOSIS — Z9114 Patient's other noncompliance with medication regimen: Secondary | ICD-10-CM | POA: Diagnosis present

## 2014-12-05 DIAGNOSIS — B20 Human immunodeficiency virus [HIV] disease: Principal | ICD-10-CM | POA: Diagnosis present

## 2014-12-05 DIAGNOSIS — N83201 Unspecified ovarian cyst, right side: Secondary | ICD-10-CM

## 2014-12-05 DIAGNOSIS — Z91148 Patient's other noncompliance with medication regimen for other reason: Secondary | ICD-10-CM | POA: Insufficient documentation

## 2014-12-05 DIAGNOSIS — J45909 Unspecified asthma, uncomplicated: Secondary | ICD-10-CM | POA: Diagnosis present

## 2014-12-05 DIAGNOSIS — R634 Abnormal weight loss: Secondary | ICD-10-CM | POA: Insufficient documentation

## 2014-12-05 DIAGNOSIS — Z21 Asymptomatic human immunodeficiency virus [HIV] infection status: Secondary | ICD-10-CM | POA: Insufficient documentation

## 2014-12-05 DIAGNOSIS — R112 Nausea with vomiting, unspecified: Secondary | ICD-10-CM | POA: Insufficient documentation

## 2014-12-05 DIAGNOSIS — K2210 Ulcer of esophagus without bleeding: Secondary | ICD-10-CM | POA: Diagnosis present

## 2014-12-05 DIAGNOSIS — K209 Esophagitis, unspecified without bleeding: Secondary | ICD-10-CM | POA: Insufficient documentation

## 2014-12-05 DIAGNOSIS — R1013 Epigastric pain: Secondary | ICD-10-CM

## 2014-12-05 DIAGNOSIS — R1110 Vomiting, unspecified: Secondary | ICD-10-CM | POA: Diagnosis present

## 2014-12-05 DIAGNOSIS — D509 Iron deficiency anemia, unspecified: Secondary | ICD-10-CM | POA: Diagnosis present

## 2014-12-05 DIAGNOSIS — E860 Dehydration: Secondary | ICD-10-CM

## 2014-12-05 DIAGNOSIS — Z881 Allergy status to other antibiotic agents status: Secondary | ICD-10-CM

## 2014-12-05 DIAGNOSIS — Z79899 Other long term (current) drug therapy: Secondary | ICD-10-CM

## 2014-12-05 DIAGNOSIS — E43 Unspecified severe protein-calorie malnutrition: Secondary | ICD-10-CM | POA: Insufficient documentation

## 2014-12-05 DIAGNOSIS — R1310 Dysphagia, unspecified: Secondary | ICD-10-CM | POA: Diagnosis present

## 2014-12-05 DIAGNOSIS — Z91013 Allergy to seafood: Secondary | ICD-10-CM

## 2014-12-05 DIAGNOSIS — A029 Salmonella infection, unspecified: Secondary | ICD-10-CM | POA: Diagnosis present

## 2014-12-05 DIAGNOSIS — D72819 Decreased white blood cell count, unspecified: Secondary | ICD-10-CM | POA: Diagnosis present

## 2014-12-05 DIAGNOSIS — Z681 Body mass index (BMI) 19 or less, adult: Secondary | ICD-10-CM

## 2014-12-05 DIAGNOSIS — R7881 Bacteremia: Secondary | ICD-10-CM | POA: Insufficient documentation

## 2014-12-05 DIAGNOSIS — Z515 Encounter for palliative care: Secondary | ICD-10-CM | POA: Insufficient documentation

## 2014-12-05 DIAGNOSIS — Z9119 Patient's noncompliance with other medical treatment and regimen: Secondary | ICD-10-CM | POA: Diagnosis present

## 2014-12-05 DIAGNOSIS — B370 Candidal stomatitis: Secondary | ICD-10-CM | POA: Diagnosis present

## 2014-12-05 DIAGNOSIS — Z882 Allergy status to sulfonamides status: Secondary | ICD-10-CM

## 2014-12-05 DIAGNOSIS — E876 Hypokalemia: Secondary | ICD-10-CM | POA: Diagnosis present

## 2014-12-05 LAB — CBC
HCT: 30 % — ABNORMAL LOW (ref 36.0–46.0)
HEMOGLOBIN: 9.5 g/dL — AB (ref 12.0–15.0)
MCH: 22.2 pg — AB (ref 26.0–34.0)
MCHC: 31.7 g/dL (ref 30.0–36.0)
MCV: 70.1 fL — AB (ref 78.0–100.0)
Platelets: 299 10*3/uL (ref 150–400)
RBC: 4.28 MIL/uL (ref 3.87–5.11)
RDW: 18.4 % — ABNORMAL HIGH (ref 11.5–15.5)
WBC: 4.8 10*3/uL (ref 4.0–10.5)

## 2014-12-05 LAB — BASIC METABOLIC PANEL
Anion gap: 22 — ABNORMAL HIGH (ref 5–15)
BUN: 12 mg/dL (ref 6–23)
CO2: 21 meq/L (ref 19–32)
CREATININE: 0.64 mg/dL (ref 0.50–1.10)
Calcium: 9.8 mg/dL (ref 8.4–10.5)
Chloride: 98 mEq/L (ref 96–112)
GFR calc Af Amer: >90 mL/min (ref >90)
GFR calc non Af Amer: >90 mL/min (ref >90)
GLUCOSE: 112 mg/dL — AB (ref 70–99)
Potassium: 3.6 mEq/L — ABNORMAL LOW (ref 3.7–5.3)
Sodium: 141 mEq/L (ref 137–147)

## 2014-12-05 LAB — I-STAT CG4 LACTIC ACID, ED: LACTIC ACID, VENOUS: 1.33 mmol/L (ref 0.5–2.2)

## 2014-12-05 LAB — POC URINE PREG, ED: Preg Test, Ur: NEGATIVE

## 2014-12-05 MED ORDER — SODIUM CHLORIDE 0.9 % IV BOLUS (SEPSIS)
1000.0000 mL | Freq: Once | INTRAVENOUS | Status: AC
  Administered 2014-12-05: 1000 mL via INTRAVENOUS

## 2014-12-05 MED ORDER — SODIUM CHLORIDE 0.9 % IV SOLN
1000.0000 mL | Freq: Once | INTRAVENOUS | Status: AC
  Administered 2014-12-05: 1000 mL via INTRAVENOUS

## 2014-12-05 MED ORDER — METOCLOPRAMIDE HCL 5 MG/ML IJ SOLN
10.0000 mg | Freq: Once | INTRAMUSCULAR | Status: AC
  Administered 2014-12-06: 10 mg via INTRAVENOUS
  Filled 2014-12-05: qty 2

## 2014-12-05 MED ORDER — SODIUM CHLORIDE 0.9 % IV SOLN: 1000.0000 mL | INTRAVENOUS | Status: DC

## 2014-12-05 MED ORDER — ONDANSETRON HCL 4 MG/2ML IJ SOLN
4.0000 mg | Freq: Once | INTRAMUSCULAR | Status: AC
  Administered 2014-12-05: 4 mg via INTRAVENOUS
  Filled 2014-12-05: qty 2

## 2014-12-05 MED ORDER — MORPHINE SULFATE 4 MG/ML IJ SOLN
4.0000 mg | Freq: Once | INTRAMUSCULAR | Status: AC
  Administered 2014-12-05: 4 mg via INTRAVENOUS
  Filled 2014-12-05: qty 1

## 2014-12-05 MED ORDER — DIPHENHYDRAMINE HCL 50 MG/ML IJ SOLN
25.0000 mg | Freq: Once | INTRAMUSCULAR | Status: AC
  Administered 2014-12-06: 25 mg via INTRAVENOUS
  Filled 2014-12-05: qty 1

## 2014-12-05 MED ORDER — PANTOPRAZOLE SODIUM 40 MG IV SOLR
40.0000 mg | Freq: Once | INTRAVENOUS | Status: AC
  Administered 2014-12-05: 40 mg via INTRAVENOUS
  Filled 2014-12-05: qty 40

## 2014-12-05 NOTE — ED Notes (Signed)
Patient states that she has not eaten since Thursday. Has been spitting up mucous, vomiting and having CP x 5 days. Alert and oriented.

## 2014-12-05 NOTE — ED Provider Notes (Signed)
CSN: [id number]     Arrival date & time 12/05/14  2010 History   First MD Initiated Contact with Patient 12/05/14 2023     Chief Complaint  Patient presents with  . Chest Pain  . Emesis     (Consider location/radiation/quality/duration/timing/severity/associated sxs/prior Treatment) HPI The patient has had prior problems with epigastric pain and vomiting. She reports that she has some esophageal ulcers. The patient's symptoms have been progressive for about 2 weeks now. She reports that she has a lot of epigastric pain and pain into her lower chest. It is both burning and sharp in nature. She was occasional cough but not a significant amount and no sputum production. She denies associated fever. She reports that she has had recurrent bouts of vomiting. She denies any blood in it. She reports that she is unable to eat anything at this point and most thinks she drinks she has to throw back up as well. She reports she's not been able to eat for the past 4 days. She reports for the past day and a half now she tries to drink anything that mucousy fluid comes back up. She reports that she feels very fatigued and weak. Past Medical History  Diagnosis Date  . HIV positive   . Hepatitis B     /E-chart  . Microcytic anemia     h/o per E-chart  . 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 Noncompliance with medication regimen     /e-chart  . 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 Pyelonephritis     h/o per E-chart  . Thyroid disease   . Depression   . Asthma     inhaler 2xday   Past Surgical History  Procedure Laterality Date  . Thyroid surgery    . Thyroid lobectomy  08/2002    left & isthmectomy; for benign thyroid adenoma/E-chart  . Tubal ligation  07/2002    /E-chart  . Esophagogastroduodenoscopy N/A 07/13/2013    Procedure: ESOPHAGOGASTRODUODENOSCOPY (EGD);  Surgeon: Beryle Beams, MD;  Location: [facility];   Service: Endoscopy;  Laterality: N/A;  . Esophagogastroduodenoscopy N/A 08/12/2014    Procedure: ESOPHAGOGASTRODUODENOSCOPY (EGD);  Surgeon: Milus Banister, MD;  Location: [facility];  Service: Endoscopy;  Laterality: N/A;   Family History  Problem Relation Age of Onset  . Cancer Mother   . Diabetes Mother   . Diabetes Father   . Heart disease Father   . Cancer Sister   . Diabetes Sister   . Asthma Son    History  Substance Use Topics  . Smoking status: Never Smoker   . Smokeless tobacco: Never Used  . Alcohol Use: Yes     Comment: occ   OB History    No data available     Review of Systems 10 Systems reviewed and are negative for acute change except as noted in the HPI.    Allergies  Bactrim; Dapsone; Orange fruit; Peanut-containing drug products; Shellfish allergy; and Sulfa antibiotics  Home Medications   Prior to Admission medications   Medication Sig Start Date End Date Taking? Authorizing Provider  albuterol (PROVENTIL HFA;VENTOLIN HFA) 108 (90 BASE) MCG/ACT inhaler Inhale 2 puffs into the lungs every 6 (six) hours as needed for wheezing. 08/20/14  Yes Theodis Blaze, MD  azithromycin (ZITHROMAX) 200 MG/5ML suspension Take 1,200 mg by mouth once a week. (30 ml)   Yes Historical Provider, MD  dexlansoprazole (  DEXILANT) 60 MG capsule Take 60 mg by mouth daily.    Yes Historical Provider, MD  elvitegravir-cobicistat-emtricitabine-tenofovir (STRIBILD) 150-150-200-300 MG TABS tablet Take 1 tablet by mouth daily with breakfast. 09/19/14  Yes Truman Hayward, MD  fluconazole (DIFLUCAN) 100 MG tablet Take 1 tablet (100 mg total) by mouth once a week. 10/10/14  Yes Michel Bickers, MD  Multiple Vitamins-Minerals (ADULT GUMMY) CHEW Chew 2 capsules by mouth daily.   Yes Historical Provider, MD  OLANZapine (ZYPREXA) 5 MG tablet Take 1 tablet (5 mg total) by mouth at bedtime. 08/20/14  Yes Theodis Blaze, MD  ondansetron (ZOFRAN) 8 MG tablet Take 8 mg by mouth every 8 (eight) hours  as needed for nausea or vomiting.   Yes Historical Provider, MD  Alum & Mag Hydroxide-Simeth (GI COCKTAIL) SUSP suspension Take 30 mLs by mouth 3 (three) times daily as needed for indigestion. Shake well. Instruction: each 30 mL contains maalox 12 ml, viscous lidocaine 12 mL, donnatal 6 mL. Please call with questions [phone] Patient not taking: Reported on 12/05/2014 08/20/14   Theodis Blaze, MD  benztropine (COGENTIN) 1 MG tablet Take 1 tablet (1 mg total) by mouth daily. Patient not taking: Reported on 12/05/2014 08/20/14   Theodis Blaze, MD  bisacodyl (DULCOLAX) 5 MG EC tablet Take 1 tablet (5 mg total) by mouth daily as needed for moderate constipation. Patient not taking: Reported on 12/05/2014 10/04/14   Michel Bickers, MD  ciprofloxacin (CIPRO) 500 MG tablet Take 1 tablet (500 mg total) by mouth 2 (two) times daily. One po bid x 7 days Patient not taking: Reported on 12/05/2014 11/01/14   Noland Fordyce, PA-C  dapsone 100 MG tablet Take 1 tablet (100 mg total) by mouth daily. Patient not taking: Reported on 12/05/2014 08/20/14   Theodis Blaze, MD  valACYclovir (VALTREX) 1000 MG tablet Take 1 tablet (1,000 mg total) by mouth 3 (three) times daily. Patient not taking: Reported on 12/05/2014 10/10/14   Michel Bickers, MD   BP 125/85 mmHg  Pulse 105  Temp(Src) 98.7 F (37.1 C) (Oral)  Resp 20  SpO2 100%  LMP 11/05/2014 (Approximate) Physical Exam  Constitutional: She is oriented to person, place, and time.  The patient is very slender appearance but well-nourished.  HENT:  Head: Normocephalic and atraumatic.  Right Ear: External ear normal.  Left Ear: External ear normal.  Nose: Nose normal.  Mouth/Throat: Oropharynx is clear and moist. No oropharyngeal exudate.  Eyes: EOM are normal. Pupils are equal, round, and reactive to light. No scleral icterus.  Neck: Neck supple.  Cardiovascular: Regular rhythm, normal heart sounds and intact distal pulses.   Patient is tachycardic. Intact  distal pulses.  Pulmonary/Chest: Effort normal and breath sounds normal. No respiratory distress. She has no wheezes. She has no rales.  Abdominal: Soft. Bowel sounds are normal. She exhibits no distension and no mass. There is tenderness (patient has very tender epigastrium. There is no guarding or rebound.). There is no rebound and no guarding.  Musculoskeletal: Normal range of motion. She exhibits no edema or tenderness.  Neurological: She is alert and oriented to person, place, and time. She has normal strength. Coordination normal. GCS eye subscore is 4. GCS verbal subscore is 5. GCS motor subscore is 6.  Skin: Skin is warm, dry and intact.  Psychiatric: She has a normal mood and affect.   The patient is sitting up retching into an emesis bag. The patient is nontoxic and does not have any respiratory  distress. She does appear mildly ill.  ED Course  Procedures (including critical care time) Labs Review Labs Reviewed  CBC - Abnormal; Notable for the following:    Hemoglobin 9.5 (*)    HCT 30.0 (*)    MCV 70.1 (*)    MCH 22.2 (*)    RDW 18.4 (*)    All other components within normal limits  BASIC METABOLIC PANEL - Abnormal; Notable for the following:    Potassium 3.6 (*)    Glucose, Bld 112 (*)    Anion gap 22 (*)    All other components within normal limits  CULTURE, BLOOD (ROUTINE X 2)  CULTURE, BLOOD (ROUTINE X 2)  HEPATIC FUNCTION PANEL  LIPASE, BLOOD  TROPONIN I  PROTIME-INR  POC URINE PREG, ED  I-STAT CG4 LACTIC ACID, ED  POC URINE PREG, ED  TYPE AND SCREEN    Imaging Review Dg Chest 2 View  12/05/2014   CLINICAL DATA:  Upper chest pain for 2 weeks, nausea and vomiting for 1 week  EXAM: CHEST  2 VIEW  COMPARISON:  12/27/2013  FINDINGS: The heart size and mediastinal contours are within normal limits. Both lungs are clear. The visualized skeletal structures are unremarkable.  IMPRESSION: No active cardiopulmonary disease.   Electronically Signed   By: Conchita Paris  M.D.   On: 12/05/2014 22:26     EKG Interpretation   Date/Time:  Monday December 05 2014 20:25:58 EST Ventricular Rate:  137 PR Interval:  127 QRS Duration: 92 QT Interval:  362 QTC Calculation: 547 R Axis:   -74 Text Interpretation:  Sinus tachycardia Consider right atrial enlargement  RSR' in V1 or V2, probably normal variant Probable inferior infarct, old  Anterolateral infarct, age indeterminate Prolonged QT interval prominent  inferior and lateral s wave. T wave inversion. No STEMI. abnormal   Confirmed by Johnney Killian, MD, Jeannie Done [phone]) on 12/05/2014 9:23:39 PM     Patient is rechecked at 23:15. She continues to have retching and complaint of fatigue and weakness. MDM   Final diagnoses:  Esophagitis  Intractable vomiting without nausea, vomiting of unspecified type  Epigastric pain  HIV (human immunodeficiency virus infection)   Patient is HIV positive with known history of esophageal ulcers. At this point with epigastric pain and central chest pain with associated intractable vomiting and difficulty with oral intake, she appears to be having symptomatic esophagitis without bleeding. There is been no blood in her emesis. Mostly this is been clear and foamy. She reports inability to eat for almost 4 days now. For a day she's had significant difficulty taking in fluids. The patient will be admitted for ongoing hydration and treatment.    Charlesetta Shanks, MD 12/05/14 [phone]

## 2014-12-05 NOTE — ED Notes (Signed)
Hospitalist at bedside 

## 2014-12-05 NOTE — ED Notes (Signed)
Spoke with provider about pt requesting additional pain medication. She stated she would order something for pain.

## 2014-12-05 NOTE — H&P (Signed)
[facility] History and Physical  [name] IRC:[id number] DOB: 03/01/1976 DOA: 12/05/2014  Referring physician: Dr Johnney Killian - Gabriel Cirri PCP: Philis Fendt, MD   Chief Complaint: abd pain and vomiting  HPI: [name] is a 38 y.o. female  abd pain and vomiting. Started 2 weeks ago. Getting worse. Epigastric. Pt reports h/o esophageal ulcers. Denies hematemasis. Burning in nature. Associated w/ occasional cough w/o sputum. Deneis fevers, diarhhea, dysuria, back pain. 2-4 non-bloody emesis per day. Unable to eat or drink for the past 4 days w/o emesis.   Has not taken Stribild in 1 month.   Did not f/u w/ GI doc after last hospitalization. Reports ongoing dysphagia for solids for months.   Review of Systems:  Constitutional: No weight loss, night sweats, Fevers, chills, HEENT:  No headaches, Difficulty swallowing,Tooth/dental problems,Sore throat,  No sneezing, itching, ear ache, nasal congestion, post nasal drip,  Cardio-vascular:  No chest pain, Orthopnea, PND, swelling in lower extremities, anasarca, dizziness, palpitations  GI:  Per HPI Resp:  No shortness of breath with exertion or at rest. No excess mucus, no productive cough, No non-productive cough, No coughing up of blood.No change in color of mucus.No wheezing.No chest wall deformity  Skin:  no rash or lesions.  GU:  no dysuria, change in color of urine, no urgency or frequency. No flank pain.  Musculoskeletal:  No joint pain or swelling. No decreased range of motion. No back pain.  Psych:  No change in mood or affect. No depression or anxiety. No memory loss.   Past Medical History  Diagnosis Date  . HIV positive   . Hepatitis B     /E-chart  . Microcytic anemia     h/o per E-chart  . 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 Noncompliance with medication regimen     /e-chart  . 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  Pyelonephritis     h/o per E-chart  . Thyroid disease   . Depression   . Asthma     inhaler 2xday   Past Surgical History  Procedure Laterality Date  . Thyroid surgery    . Thyroid lobectomy  08/2002    left & isthmectomy; for benign thyroid adenoma/E-chart  . Tubal ligation  07/2002    /E-chart  . Esophagogastroduodenoscopy N/A 07/13/2013    Procedure: ESOPHAGOGASTRODUODENOSCOPY (EGD);  Surgeon: Beryle Beams, MD;  Location: [facility];  Service: Endoscopy;  Laterality: N/A;  . Esophagogastroduodenoscopy N/A 08/12/2014    Procedure: ESOPHAGOGASTRODUODENOSCOPY (EGD);  Surgeon: Milus Banister, MD;  Location: [facility];  Service: Endoscopy;  Laterality: N/A;   Social History:  reports that she has never smoked. She has never used smokeless tobacco. She reports that she drinks alcohol. She reports that she does not use illicit drugs.  Allergies  Allergen Reactions  . Bactrim Itching  . Dapsone Itching  . Orange Fruit Itching  . Peanut-Containing Drug Products Hives  . Shellfish Allergy Hives  . Sulfa Antibiotics Itching    Family History  Problem Relation Age of Onset  . Cancer Mother   . Diabetes Mother   . Diabetes Father   . Heart disease Father   . Cancer Sister   . Diabetes Sister   . Asthma Son      Prior to Admission medications   Medication Sig Start Date End Date Taking? Authorizing Provider  albuterol (PROVENTIL HFA;VENTOLIN HFA) 108 (90 BASE)  MCG/ACT inhaler Inhale 2 puffs into the lungs every 6 (six) hours as needed for wheezing. 08/20/14  Yes Theodis Blaze, MD  azithromycin (ZITHROMAX) 200 MG/5ML suspension Take 1,200 mg by mouth once a week. (30 ml)   Yes Historical Provider, MD  dexlansoprazole (DEXILANT) 60 MG capsule Take 60 mg by mouth daily.    Yes Historical Provider, MD  elvitegravir-cobicistat-emtricitabine-tenofovir (STRIBILD) 150-150-200-300 MG TABS tablet Take 1 tablet by mouth daily with breakfast. 09/19/14  Yes Truman Hayward, MD    fluconazole (DIFLUCAN) 100 MG tablet Take 1 tablet (100 mg total) by mouth once a week. 10/10/14  Yes Michel Bickers, MD  Multiple Vitamins-Minerals (ADULT GUMMY) CHEW Chew 2 capsules by mouth daily.   Yes Historical Provider, MD  OLANZapine (ZYPREXA) 5 MG tablet Take 1 tablet (5 mg total) by mouth at bedtime. 08/20/14  Yes Theodis Blaze, MD  ondansetron (ZOFRAN) 8 MG tablet Take 8 mg by mouth every 8 (eight) hours as needed for nausea or vomiting.   Yes Historical Provider, MD  Alum & Mag Hydroxide-Simeth (GI COCKTAIL) SUSP suspension Take 30 mLs by mouth 3 (three) times daily as needed for indigestion. Shake well. Instruction: each 30 mL contains maalox 12 ml, viscous lidocaine 12 mL, donnatal 6 mL. Please call with questions [phone] Patient not taking: Reported on 12/05/2014 08/20/14   Theodis Blaze, MD  benztropine (COGENTIN) 1 MG tablet Take 1 tablet (1 mg total) by mouth daily. Patient not taking: Reported on 12/05/2014 08/20/14   Theodis Blaze, MD  bisacodyl (DULCOLAX) 5 MG EC tablet Take 1 tablet (5 mg total) by mouth daily as needed for moderate constipation. Patient not taking: Reported on 12/05/2014 10/04/14   Michel Bickers, MD  ciprofloxacin (CIPRO) 500 MG tablet Take 1 tablet (500 mg total) by mouth 2 (two) times daily. One po bid x 7 days Patient not taking: Reported on 12/05/2014 11/01/14   Noland Fordyce, PA-C  dapsone 100 MG tablet Take 1 tablet (100 mg total) by mouth daily. Patient not taking: Reported on 12/05/2014 08/20/14   Theodis Blaze, MD  valACYclovir (VALTREX) 1000 MG tablet Take 1 tablet (1,000 mg total) by mouth 3 (three) times daily. Patient not taking: Reported on 12/05/2014 10/10/14   Michel Bickers, MD   Physical Exam: Filed Vitals:   12/05/14 2312 12/05/14 2327 12/06/14 0003 12/06/14 0038  BP: 100/73 125/85 133/92 119/78  Pulse:  105 95 82  Temp:  98.7 F (37.1 C)  97.8 F (36.6 C)  TempSrc:  Oral  Oral  Resp:  20 16 18   Height:    6' (1.829 m)  Weight:     59.149 kg (130 lb 6.4 oz)  SpO2:  100% 100% 100%    Wt Readings from Last 3 Encounters:  12/06/14 59.149 kg (130 lb 6.4 oz)  10/04/14 71.895 kg (158 lb 8 oz)  08/25/14 74.163 kg (163 lb 8 oz)    General: THin and frail appearing Eyes:  PERRL, normal lids, irises & conjunctiva ENT: Dry mucus membranes Neck:  no LAD, masses or thyromegaly Cardiovascular:  RRR, no m/r/g. No LE edema. Telemetry:  SR, no arrhythmias  Respiratory:  CTA bilaterally, no w/r/r. Normal respiratory effort. Abdomen: Epigastric pain on palpation Skin:  no rash or induration seen on limited exam Musculoskeletal:  grossly normal tone BUE/BLE Psychiatric:  grossly normal mood and affect, speech fluent and appropriate Neurologic:  grossly non-focal.          Labs on Admission:  Basic Metabolic Panel:  Recent Labs Lab 12/05/14 2047  NA 141  K 3.6*  CL 98  CO2 21  GLUCOSE 112*  BUN 12  CREATININE 0.64  CALCIUM 9.8   Liver Function Tests: No results for input(s): AST, ALT, ALKPHOS, BILITOT, PROT, ALBUMIN in the last 168 hours. No results for input(s): LIPASE, AMYLASE in the last 168 hours. No results for input(s): AMMONIA in the last 168 hours. CBC:  Recent Labs Lab 12/05/14 2047 12/05/14 2349  WBC 4.8 3.9*  NEUTROABS  --  3.3  HGB 9.5* 7.7*  HCT 30.0* 24.8*  MCV 70.1* 70.9*  PLT 299 265   Cardiac Enzymes: No results for input(s): CKTOTAL, CKMB, CKMBINDEX, TROPONINI in the last 168 hours.  BNP (last 3 results) No results for input(s): PROBNP in the last 8760 hours. CBG: No results for input(s): GLUCAP in the last 168 hours.  Radiological Exams on Admission: Dg Chest 2 View  12/05/2014   CLINICAL DATA:  Upper chest pain for 2 weeks, nausea and vomiting for 1 week  EXAM: CHEST  2 VIEW  COMPARISON:  12/27/2013  FINDINGS: The heart size and mediastinal contours are within normal limits. Both lungs are clear. The visualized skeletal structures are unremarkable.  IMPRESSION: No active  cardiopulmonary disease.   Electronically Signed   By: Conchita Paris M.D.   On: 12/05/2014 22:26    EKG: Independently reviewed. Sinus tach, no sign of ACS  Assessment/Plan Principal Problem:   Intractable vomiting Active Problems:   Human immunodeficiency virus (HIV) disease   Microcytic anemia   Bipolar disorder   Esophageal ulcer   Esophageal stricture   Dehydration  Intractable vomiting: intermittent issue for pt. Etiology unclear but may be related to recent dx of esophageal ulcers and strictures vs cyclic vomiting or gastroparesis. Pt reports worsening dysphagia for solids (affecting her ability to take HIV medications).  - Admit  - Reglan - Zofran - Sucralfate - IV protonix - GI consult (pt did not f/u outpt) - NPO - UDS - NS 197ml/hr  HIV: Pt endorses non-compliance for past month (see dysphagia above). Last viral load 10/04/14 555,073 and CD4 count <10. - Stribild - CD4 count and % - HIV viral load - ID consult in the am  Bipolar: stable - continue Cogentin, Zyprexa  Anemia: microcytic. Chronic. Hgb on admission 9.5, baseline 9.3.  - CBC in am - anemia panel  Code Status: FULL DVT Prophylaxis: Heparin Family Communication: Boyfriend/spouse Disposition Plan: pending improvement  Bowen Goyal, [facility] [email or web address] Password [email or web address]

## 2014-12-05 NOTE — ED Notes (Signed)
Pt ambulated to restroom and back to bed. Tolerated well. Pt stated she felt nausea with getting up out of bed.

## 2014-12-06 ENCOUNTER — Encounter (HOSPITAL_COMMUNITY): Payer: Self-pay | Admitting: *Deleted

## 2014-12-06 ENCOUNTER — Inpatient Hospital Stay (HOSPITAL_COMMUNITY): Payer: Medicaid Other

## 2014-12-06 DIAGNOSIS — Z91010 Allergy to peanuts: Secondary | ICD-10-CM | POA: Diagnosis not present

## 2014-12-06 DIAGNOSIS — R7881 Bacteremia: Secondary | ICD-10-CM | POA: Diagnosis present

## 2014-12-06 DIAGNOSIS — K209 Esophagitis, unspecified: Secondary | ICD-10-CM | POA: Diagnosis present

## 2014-12-06 DIAGNOSIS — E860 Dehydration: Secondary | ICD-10-CM

## 2014-12-06 DIAGNOSIS — B370 Candidal stomatitis: Secondary | ICD-10-CM | POA: Diagnosis present

## 2014-12-06 DIAGNOSIS — Z9114 Patient's other noncompliance with medication regimen: Secondary | ICD-10-CM | POA: Diagnosis present

## 2014-12-06 DIAGNOSIS — B20 Human immunodeficiency virus [HIV] disease: Secondary | ICD-10-CM | POA: Insufficient documentation

## 2014-12-06 DIAGNOSIS — E43 Unspecified severe protein-calorie malnutrition: Secondary | ICD-10-CM | POA: Insufficient documentation

## 2014-12-06 DIAGNOSIS — Z91013 Allergy to seafood: Secondary | ICD-10-CM | POA: Diagnosis not present

## 2014-12-06 DIAGNOSIS — D509 Iron deficiency anemia, unspecified: Secondary | ICD-10-CM | POA: Diagnosis present

## 2014-12-06 DIAGNOSIS — Z9119 Patient's noncompliance with other medical treatment and regimen: Secondary | ICD-10-CM | POA: Diagnosis present

## 2014-12-06 DIAGNOSIS — R1013 Epigastric pain: Secondary | ICD-10-CM | POA: Diagnosis present

## 2014-12-06 DIAGNOSIS — Z79899 Other long term (current) drug therapy: Secondary | ICD-10-CM | POA: Diagnosis not present

## 2014-12-06 DIAGNOSIS — J45909 Unspecified asthma, uncomplicated: Secondary | ICD-10-CM | POA: Diagnosis present

## 2014-12-06 DIAGNOSIS — K222 Esophageal obstruction: Secondary | ICD-10-CM

## 2014-12-06 DIAGNOSIS — Z882 Allergy status to sulfonamides status: Secondary | ICD-10-CM | POA: Diagnosis not present

## 2014-12-06 DIAGNOSIS — Z681 Body mass index (BMI) 19 or less, adult: Secondary | ICD-10-CM | POA: Diagnosis not present

## 2014-12-06 DIAGNOSIS — Z881 Allergy status to other antibiotic agents status: Secondary | ICD-10-CM | POA: Diagnosis not present

## 2014-12-06 DIAGNOSIS — K2210 Ulcer of esophagus without bleeding: Secondary | ICD-10-CM | POA: Diagnosis present

## 2014-12-06 DIAGNOSIS — Z21 Asymptomatic human immunodeficiency virus [HIV] infection status: Secondary | ICD-10-CM | POA: Insufficient documentation

## 2014-12-06 DIAGNOSIS — F319 Bipolar disorder, unspecified: Secondary | ICD-10-CM | POA: Diagnosis present

## 2014-12-06 DIAGNOSIS — R1310 Dysphagia, unspecified: Secondary | ICD-10-CM | POA: Diagnosis present

## 2014-12-06 DIAGNOSIS — Z515 Encounter for palliative care: Secondary | ICD-10-CM | POA: Diagnosis not present

## 2014-12-06 DIAGNOSIS — D72819 Decreased white blood cell count, unspecified: Secondary | ICD-10-CM | POA: Diagnosis present

## 2014-12-06 DIAGNOSIS — A029 Salmonella infection, unspecified: Secondary | ICD-10-CM | POA: Diagnosis present

## 2014-12-06 DIAGNOSIS — E876 Hypokalemia: Secondary | ICD-10-CM | POA: Diagnosis present

## 2014-12-06 LAB — CBC
HEMATOCRIT: 24.6 % — AB (ref 36.0–46.0)
HEMOGLOBIN: 7.5 g/dL — AB (ref 12.0–15.0)
MCH: 21.8 pg — ABNORMAL LOW (ref 26.0–34.0)
MCHC: 30.5 g/dL (ref 30.0–36.0)
MCV: 71.5 fL — ABNORMAL LOW (ref 78.0–100.0)
Platelets: 266 10*3/uL (ref 150–400)
RBC: 3.44 MIL/uL — ABNORMAL LOW (ref 3.87–5.11)
RDW: 18.3 % — ABNORMAL HIGH (ref 11.5–15.5)
WBC: 3.2 10*3/uL — ABNORMAL LOW (ref 4.0–10.5)

## 2014-12-06 LAB — COMPREHENSIVE METABOLIC PANEL
ALBUMIN: 3.1 g/dL — AB (ref 3.5–5.2)
ALK PHOS: 68 U/L (ref 39–117)
ALT: 9 U/L (ref 0–35)
AST: 15 U/L (ref 0–37)
Anion gap: 10 (ref 5–15)
BUN: 11 mg/dL (ref 6–23)
CO2: 24 mmol/L (ref 19–32)
Calcium: 8.2 mg/dL — ABNORMAL LOW (ref 8.4–10.5)
Chloride: 110 mEq/L (ref 96–112)
Creatinine, Ser: 0.6 mg/dL (ref 0.50–1.10)
GFR calc Af Amer: >90 mL/min (ref >90)
GFR calc non Af Amer: >90 mL/min (ref >90)
Glucose, Bld: 87 mg/dL (ref 70–99)
POTASSIUM: 3.4 mmol/L — AB (ref 3.5–5.1)
SODIUM: 144 mmol/L (ref 135–145)
TOTAL PROTEIN: 6.4 g/dL (ref 6.0–8.3)
Total Bilirubin: 0.5 mg/dL (ref 0.3–1.2)

## 2014-12-06 LAB — HIV-1 RNA QUANT-NO REFLEX-BLD
HIV 1 RNA QUANT: 423167 {copies}/mL — AB (ref <20)
HIV-1 RNA Quant, Log: 5.63 {Log} — ABNORMAL HIGH (ref <1.30)

## 2014-12-06 LAB — CD4/CD8 (T-HELPER/T-SUPPRESSOR CELL)
CD4%: 1 % — ABNORMAL LOW (ref 30.0–60.0)
CD8 T Cell Abs: 120 /uL — ABNORMAL LOW (ref 230–1000)
CD8tox: 48 % — ABNORMAL HIGH (ref 15.0–40.0)
RATIO: 0.02 — AB (ref 1.0–3.0)
Total lymphocyte count: 250 /uL — ABNORMAL LOW (ref 1000–4000)

## 2014-12-06 LAB — RAPID URINE DRUG SCREEN, HOSP PERFORMED
Amphetamines: NOT DETECTED
BARBITURATES: NOT DETECTED
Benzodiazepines: NOT DETECTED
COCAINE: NOT DETECTED
Opiates: POSITIVE — AB
TETRAHYDROCANNABINOL: NOT DETECTED

## 2014-12-06 LAB — CBC WITH DIFFERENTIAL/PLATELET
BASOS ABS: 0 10*3/uL (ref 0.0–0.1)
Basophils Relative: 0 % (ref 0–1)
Eosinophils Absolute: 0 10*3/uL (ref 0.0–0.7)
Eosinophils Relative: 0 % (ref 0–5)
HCT: 24.8 % — ABNORMAL LOW (ref 36.0–46.0)
Hemoglobin: 7.7 g/dL — ABNORMAL LOW (ref 12.0–15.0)
Lymphocytes Relative: 6 % — ABNORMAL LOW (ref 12–46)
Lymphs Abs: 0.3 10*3/uL — ABNORMAL LOW (ref 0.7–4.0)
MCH: 22 pg — ABNORMAL LOW (ref 26.0–34.0)
MCHC: 31 g/dL (ref 30.0–36.0)
MCV: 70.9 fL — ABNORMAL LOW (ref 78.0–100.0)
Monocytes Absolute: 0.3 10*3/uL (ref 0.1–1.0)
Monocytes Relative: 9 % (ref 3–12)
NEUTROS ABS: 3.3 10*3/uL (ref 1.7–7.7)
NEUTROS PCT: 85 % — AB (ref 43–77)
Platelets: 265 10*3/uL (ref 150–400)
RBC: 3.5 MIL/uL — ABNORMAL LOW (ref 3.87–5.11)
RDW: 18.3 % — AB (ref 11.5–15.5)
WBC: 3.9 10*3/uL — ABNORMAL LOW (ref 4.0–10.5)

## 2014-12-06 LAB — RETICULOCYTES: RBC.: 3.43 MIL/uL — AB (ref 3.87–5.11)

## 2014-12-06 LAB — IRON AND TIBC
Iron: 10 ug/dL — ABNORMAL LOW (ref 42–135)
Saturation Ratios: 5 % — ABNORMAL LOW (ref 20–55)
TIBC: 214 ug/dL — ABNORMAL LOW (ref 250–470)
UIBC: 204 ug/dL (ref 125–400)

## 2014-12-06 LAB — FOLATE: Folate: 4.5 ng/mL

## 2014-12-06 LAB — HEPATIC FUNCTION PANEL
ALT: 9 U/L (ref 0–35)
AST: 16 U/L (ref 0–37)
Albumin: 3.1 g/dL — ABNORMAL LOW (ref 3.5–5.2)
Alkaline Phosphatase: 70 U/L (ref 39–117)
BILIRUBIN TOTAL: 0.5 mg/dL (ref 0.3–1.2)
Total Protein: 6.2 g/dL (ref 6.0–8.3)

## 2014-12-06 LAB — LIPASE, BLOOD: Lipase: 40 U/L (ref 11–59)

## 2014-12-06 LAB — TROPONIN I: Troponin I: <0.03 ng/mL (ref <0.031)

## 2014-12-06 LAB — FERRITIN: FERRITIN: 496 ng/mL — AB (ref 10–291)

## 2014-12-06 LAB — PROTIME-INR
INR: 1.09 (ref 0.00–1.49)
PROTHROMBIN TIME: 14.2 s (ref 11.6–15.2)

## 2014-12-06 LAB — VITAMIN B12: Vitamin B-12: 896 pg/mL (ref 211–911)

## 2014-12-06 MED ORDER — SODIUM CHLORIDE 0.9 % IV SOLN
INTRAVENOUS | Status: DC
  Administered 2014-12-06: 01:00:00 via INTRAVENOUS

## 2014-12-06 MED ORDER — ELVITEG-COBIC-EMTRICIT-TENOFAF 150-150-200-10 MG PO TABS
1.0000 | ORAL_TABLET | Freq: Every day | ORAL | Status: DC
  Administered 2014-12-08 – 2014-12-12 (×5): 1 via ORAL
  Filled 2014-12-06 (×11): qty 1

## 2014-12-06 MED ORDER — ENSURE COMPLETE PO LIQD
237.0000 mL | Freq: Two times a day (BID) | ORAL | Status: DC
  Administered 2014-12-06 – 2014-12-12 (×10): 237 mL via ORAL

## 2014-12-06 MED ORDER — POLYETHYLENE GLYCOL 3350 17 G PO PACK
17.0000 g | PACK | Freq: Every day | ORAL | Status: DC | PRN
  Filled 2014-12-06: qty 1

## 2014-12-06 MED ORDER — SUCRALFATE 1 GM/10ML PO SUSP
1.0000 g | Freq: Three times a day (TID) | ORAL | Status: DC
  Administered 2014-12-06 – 2014-12-12 (×25): 1 g via ORAL
  Filled 2014-12-06 (×30): qty 10

## 2014-12-06 MED ORDER — METOCLOPRAMIDE HCL 5 MG/ML IJ SOLN
10.0000 mg | Freq: Three times a day (TID) | INTRAMUSCULAR | Status: DC
  Administered 2014-12-06 – 2014-12-11 (×17): 10 mg via INTRAVENOUS
  Filled 2014-12-06 (×23): qty 2

## 2014-12-06 MED ORDER — MORPHINE SULFATE 2 MG/ML IJ SOLN
2.0000 mg | INTRAMUSCULAR | Status: DC | PRN
  Administered 2014-12-06 – 2014-12-08 (×16): 2 mg via INTRAVENOUS
  Filled 2014-12-06 (×16): qty 1

## 2014-12-06 MED ORDER — BENZTROPINE MESYLATE 1 MG PO TABS
1.0000 mg | ORAL_TABLET | Freq: Every day | ORAL | Status: DC
  Administered 2014-12-08 – 2014-12-12 (×4): 1 mg via ORAL
  Filled 2014-12-06 (×7): qty 1

## 2014-12-06 MED ORDER — SENNA 8.6 MG PO TABS
1.0000 | ORAL_TABLET | Freq: Two times a day (BID) | ORAL | Status: DC
  Administered 2014-12-07 – 2014-12-12 (×5): 8.6 mg via ORAL
  Filled 2014-12-06 (×7): qty 1

## 2014-12-06 MED ORDER — ONDANSETRON HCL 4 MG PO TABS
4.0000 mg | ORAL_TABLET | Freq: Four times a day (QID) | ORAL | Status: DC | PRN
  Administered 2014-12-07: 4 mg via ORAL
  Filled 2014-12-06: qty 1

## 2014-12-06 MED ORDER — ONDANSETRON HCL 4 MG/2ML IJ SOLN: 4.0000 mg | Freq: Four times a day (QID) | INTRAMUSCULAR | Status: DC | PRN

## 2014-12-06 MED ORDER — ACETAMINOPHEN 325 MG PO TABS: 650.0000 mg | ORAL_TABLET | Freq: Four times a day (QID) | ORAL | Status: DC | PRN

## 2014-12-06 MED ORDER — ACETAMINOPHEN 650 MG RE SUPP: 650.0000 mg | Freq: Four times a day (QID) | RECTAL | Status: DC | PRN

## 2014-12-06 MED ORDER — HEPARIN SODIUM (PORCINE) 5000 UNIT/ML IJ SOLN
5000.0000 [IU] | Freq: Three times a day (TID) | INTRAMUSCULAR | Status: DC
  Administered 2014-12-06: 5000 [IU] via SUBCUTANEOUS
  Filled 2014-12-06 (×5): qty 1

## 2014-12-06 MED ORDER — IOHEXOL 300 MG/ML  SOLN
50.0000 mL | Freq: Once | INTRAMUSCULAR | Status: AC | PRN
  Administered 2014-12-06: 50 mL via ORAL

## 2014-12-06 MED ORDER — OLANZAPINE 5 MG PO TABS
5.0000 mg | ORAL_TABLET | Freq: Every day | ORAL | Status: DC
Start: 2014-12-06 — End: 2014-12-12
  Administered 2014-12-06 – 2014-12-11 (×6): 5 mg via ORAL
  Filled 2014-12-06 (×8): qty 1

## 2014-12-06 MED ORDER — PANTOPRAZOLE SODIUM 40 MG IV SOLR
40.0000 mg | Freq: Two times a day (BID) | INTRAVENOUS | Status: DC
  Administered 2014-12-06 – 2014-12-10 (×11): 40 mg via INTRAVENOUS
  Filled 2014-12-06 (×15): qty 40

## 2014-12-06 MED ORDER — FLUCONAZOLE IN SODIUM CHLORIDE 200-0.9 MG/100ML-% IV SOLN
200.0000 mg | INTRAVENOUS | Status: DC
  Administered 2014-12-06 – 2014-12-10 (×5): 200 mg via INTRAVENOUS
  Filled 2014-12-06 (×7): qty 100

## 2014-12-06 MED ORDER — POTASSIUM CHLORIDE CRYS ER 20 MEQ PO TBCR
40.0000 meq | EXTENDED_RELEASE_TABLET | Freq: Once | ORAL | Status: AC
  Administered 2014-12-06: 40 meq via ORAL
  Filled 2014-12-06: qty 2

## 2014-12-06 MED ORDER — SODIUM CHLORIDE 0.9 % IV SOLN
INTRAVENOUS | Status: DC
  Administered 2014-12-06: 10:00:00 via INTRAVENOUS

## 2014-12-06 MED ORDER — GI COCKTAIL ~~LOC~~
30.0000 mL | Freq: Three times a day (TID) | ORAL | Status: DC
  Administered 2014-12-06 – 2014-12-12 (×22): 30 mL via ORAL
  Filled 2014-12-06 (×29): qty 30

## 2014-12-06 MED ORDER — IOHEXOL 300 MG/ML  SOLN
100.0000 mL | Freq: Once | INTRAMUSCULAR | Status: AC | PRN
  Administered 2014-12-06: 100 mL via INTRAVENOUS

## 2014-12-06 NOTE — ED Notes (Signed)
Pt does spit but not vomiting.

## 2014-12-06 NOTE — Progress Notes (Signed)
Patient ID: [name], female   DOB: 1976/03/30, 38 y.o.   MRN: [id number]  [facility] PROGRESS NOTE  [name] OJJ:[id number] DOB: 1976-02-20 DOA: 12/05/2014 PCP: Philis Fendt, MD  Brief narrative: 38 y.o. female with history of esophageal ulcers, HIV and Hep B, last CD 4# in 08/2014 ~ 56, now presented to [facility] ED with main concern of several days duration of progressively worsening epigastric pain, associated with nausea and several episodes of non bloody vomiting, poor oral intake.  Assessment and Plan:    Principal Problem:   Intractable vomiting - this is rather chronic in nature and worse in the setting of poorly controlled HIV from medical non compliance  - continue Protonix IV BID, GI cocktail as that has helped in the past - not sure that there [place] much to do for GI team here, her last EGD was done 07/2104 and per Dr. Ardis Hughs, these ulcers are HIV related and there is really no other treatment for it other than HIV control, which has been a challenge for this pt given underlying bipolar disorder and financial difficulty - to my knowledge, pt is getting assistance for HIV treatment - pt CD 4# is still 10 this admit  - CT abd pelvis with contrast pending  Active Problems:   Microcytic anemia - last iron level ~10, so also likely HIV related  - will transfuse if Hg < 7.5 - repeat CBC in AM   Leukopenia - from HIV   Human immunodeficiency virus (HIV) disease - poorly controlled as noted above - discussed with pt in detail and she tells me she has been compliant    Bipolar disorder - appears stable at this time    Esophageal stricture - this is the primary cause for her dysphagia and also noted on EGD in 07/2014 - again primary focus is HIV treatment at this time  - place on Fluconazole IV for now given oral thrush    Protein-calorie malnutrition, severe - secondary to advanced HIV, progressive and uncontrolled - advance diet as pt able to tolerate  DVT  prophylaxis  SCD's  Code Status: Full Family Communication: Pt at bedside Disposition Plan: Home when medically stable   IV Access:   Peripheral IV Procedures and diagnostic studies:   Dg Chest 2 View  12/05/2014  No active cardiopulmonary disease.   Medical Consultants:   None  Other Consultants:   None   Anti-Infectives:   ART regimen   Faye Ramsay, MD  [facility] Pager [phone]  If 7PM-7AM, please contact night-coverage [email or web address] Password [email or web address] 12/06/2014, 2:22 PM   LOS: 1 day   HPI/Subjective: No events overnight.   Objective: Filed Vitals:   12/05/14 2327 12/06/14 0003 12/06/14 0038 12/06/14 0441  BP: 125/85 133/92 119/78 119/81  Pulse: 105 95 82 84  Temp: 98.7 F (37.1 C)  97.8 F (36.6 C) 98 F (36.7 C)  TempSrc: Oral  Oral Oral  Resp: 20 16 18 16   Height:   6' (1.829 m)   Weight:   59.149 kg (130 lb 6.4 oz)   SpO2: 100% 100% 100% 100%    Intake/Output Summary (Last 24 hours) at 12/06/14 1422 Last data filed at 12/06/14 1116  Gross per 24 hour  Intake 700.42 ml  Output      0 ml  Net 700.42 ml    Exam:   General:  Pt is alert, follows commands appropriately, not in acute distress, oral thrush   Cardiovascular: Regular rate and rhythm, S1/S2,  no murmurs, no rubs, no gallops  Respiratory: Clear to auscultation bilaterally, no wheezing, no crackles, no rhonchi  Abdomen: Soft, tender in epigastric area, non distended, bowel sounds present, no guarding   Data Reviewed: Basic Metabolic Panel:  Recent Labs Lab 12/05/14 2047 12/06/14 0435  NA 141 144  K 3.6* 3.4*  CL 98 110  CO2 21 24  GLUCOSE 112* 87  BUN 12 11  CREATININE 0.64 0.60  CALCIUM 9.8 8.2*   Liver Function Tests:  Recent Labs Lab 12/06/14 0435  AST 16  15  ALT 9  9  ALKPHOS 70  68  BILITOT 0.5  0.5  PROT 6.2  6.4  ALBUMIN 3.1*  3.1*    Recent Labs Lab 12/06/14 0435  LIPASE 40   CBC:  Recent Labs Lab 12/05/14 2047 12/05/14 2349  12/06/14 0435  WBC 4.8 3.9* 3.2*  NEUTROABS  --  3.3  --   HGB 9.5* 7.7* 7.5*  HCT 30.0* 24.8* 24.6*  MCV 70.1* 70.9* 71.5*  PLT 299 265 266   Cardiac Enzymes:  Recent Labs Lab 12/06/14 0435  TROPONINI <0.03    Scheduled Meds: . benztropine  1 mg Oral Daily  . elvitegravir-cobicistat-emtricitabine-tenofovir  1 tablet Oral Q breakfast  . heparin  5,000 Units Subcutaneous 3 times per day  . metoCLOPramide   10 mg Intravenous 3 times per day  . OLANZapine  5 mg Oral QHS  . pantoprazole  IV  40 mg Intravenous Q12H  . senna  1 tablet Oral BID  . sucralfate  1 g Oral TID WC & HS   Continuous Infusions: . sodium chloride 125 mL/hr at 12/06/14 0106  . sodium chloride 125 mL/hr at 12/06/14 1001

## 2014-12-06 NOTE — Progress Notes (Signed)
UR completed 

## 2014-12-06 NOTE — Progress Notes (Signed)
INITIAL NUTRITION ASSESSMENT  DOCUMENTATION CODES Per approved criteria  -Severe malnutrition in the context of chronic illness  Pt meets criteria for severe MALNUTRITION in the context of chronic illness as evidenced by 28% weight loss x 11 months and energy intake <75% for >1 month.  INTERVENTION: -Provide Ensure Complete po BID, each supplement provides 350 kcal and 13 grams of protein -Provide Magic cup BID with meals, each supplement provides 290 kcal and 9 grams of protein -Encourage PO intake -RD to continue to monitor  NUTRITION DIAGNOSIS: Inadequate oral intake related to decreased appetite, vomiting as evidenced by 28% weight loss x 11 months.   Goal: Pt to meet >/= 90% of their estimated nutrition needs   Monitor:  Diet advancement, weight, labs, I/O's  Reason for Assessment: Pt identified as at nutrition risk on the Malnutrition Screen Tool  Admitting Dx: Intractable vomiting  ASSESSMENT: 38 y.o. female withabd pain and vomiting. Started 2 weeks ago. Getting worse. Epigastric. Pt reports h/o esophageal ulcers. Reports ongoing dysphagia for solids for months. Pmhx of HIV.  Pt reports large amount of weight loss. Per weight history documentation pt has lost 50 lb since January (28% weight loss x 11 months, significant for time frame).  Pt reports vomiting for 1.5 weeks, unable to eat much and she was drinking very little. Pt was attempting to consume ice cream during visit but c/o chest pain.  Pt is willing to try Ensure supplements between meals and magic cups with meals. RD to order BID.  Nutrition Focused Physical Exam:  Subcutaneous Fat:  Orbital Region: WNL Upper Arm Region: WNL Thoracic and Lumbar Region: NA  Muscle:  Temple Region: moderate depletion Clavicle Bone Region: moderate depletion Clavicle and Acromion Bone Region: moderate depletion Scapular Bone Region: WNL Dorsal Hand: mild depletion Patellar Region: NA Anterior Thigh Region:  NA Posterior Calf Region: NA  Edema: no LE edema  Labs reviewed: Low K  Height: Ht Readings from Last 1 Encounters:  12/06/14 6' (1.829 m)    Weight: Wt Readings from Last 1 Encounters:  12/06/14 130 lb 6.4 oz (59.149 kg)    Ideal Body Weight: 160 lb  % Ideal Body Weight: 81%  Wt Readings from Last 10 Encounters:  12/06/14 130 lb 6.4 oz (59.149 kg)  10/04/14 158 lb 8 oz (71.895 kg)  08/25/14 163 lb 8 oz (74.163 kg)  08/10/14 162 lb 11.2 oz (73.8 kg)  01/13/14 180 lb (81.647 kg)  01/03/14 180 lb (81.647 kg)  12/27/13 174 lb 1.6 oz (78.971 kg)  09/14/13 159 lb (72.122 kg)  08/30/13 149 lb (67.586 kg)  08/10/13 138 lb 8 oz (62.823 kg)    Usual Body Weight: 180 lb  % Usual Body Weight: 72%  BMI:  Body mass index is 17.68 kg/(m^2).  Estimated Nutritional Needs: Kcal: 2000-2200 Protein: 85-95g Fluid: 2L/day  Skin: intact  Diet Order: Diet NPO time specified  EDUCATION NEEDS: -Education not appropriate at this time   Intake/Output Summary (Last 24 hours) at 12/06/14 0917 Last data filed at 12/06/14 0447  Gross per 24 hour  Intake 460.42 ml  Output      0 ml  Net 460.42 ml    Last BM: PTA  Labs:   Recent Labs Lab 12/05/14 2047 12/06/14 0435  NA 141 144  K 3.6* 3.4*  CL 98 110  CO2 21 24  BUN 12 11  CREATININE 0.64 0.60  CALCIUM 9.8 8.2*  GLUCOSE 112* 87    CBG (last 3)  No  results for input(s): GLUCAP in the last 72 hours.  Scheduled Meds: . benztropine  1 mg Oral Daily  . elvitegravir-cobicistat-emtricitabine-tenofovir  1 tablet Oral Q breakfast  . heparin  5,000 Units Subcutaneous 3 times per day  . metoCLOPramide (REGLAN) injection  10 mg Intravenous 3 times per day  . OLANZapine  5 mg Oral QHS  . pantoprazole (PROTONIX) IV  40 mg Intravenous Q12H  . senna  1 tablet Oral BID  . sucralfate  1 g Oral TID WC & HS    Continuous Infusions: . sodium chloride 125 mL/hr at 12/06/14 0106  . sodium chloride      Past Medical History   Diagnosis Date  . HIV positive   . Hepatitis B     /E-chart  . Microcytic anemia     h/o per E-chart  . 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 Noncompliance with medication regimen     /e-chart  . 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 Pyelonephritis     h/o per E-chart  . Thyroid disease   . Depression   . Asthma     inhaler 2xday    Past Surgical History  Procedure Laterality Date  . Thyroid surgery    . Thyroid lobectomy  08/2002    left & isthmectomy; for benign thyroid adenoma/E-chart  . Tubal ligation  07/2002    /E-chart  . Esophagogastroduodenoscopy N/A 07/13/2013    Procedure: ESOPHAGOGASTRODUODENOSCOPY (EGD);  Surgeon: Beryle Beams, MD;  Location: [facility];  Service: Endoscopy;  Laterality: N/A;  . Esophagogastroduodenoscopy N/A 08/12/2014    Procedure: ESOPHAGOGASTRODUODENOSCOPY (EGD);  Surgeon: Milus Banister, MD;  Location: [facility];  Service: Endoscopy;  Laterality: N/A;    Clayton Bibles, MS, RD, LDN Pager: [phone] After Hours Pager: [phone]

## 2014-12-07 ENCOUNTER — Inpatient Hospital Stay (HOSPITAL_COMMUNITY): Payer: Medicaid Other

## 2014-12-07 DIAGNOSIS — Z515 Encounter for palliative care: Secondary | ICD-10-CM

## 2014-12-07 DIAGNOSIS — R634 Abnormal weight loss: Secondary | ICD-10-CM

## 2014-12-07 LAB — CBC
HCT: 20.4 % — ABNORMAL LOW (ref 36.0–46.0)
Hemoglobin: 6.5 g/dL — CL (ref 12.0–15.0)
MCH: 22.5 pg — AB (ref 26.0–34.0)
MCHC: 31.9 g/dL (ref 30.0–36.0)
MCV: 70.6 fL — ABNORMAL LOW (ref 78.0–100.0)
PLATELETS: 210 10*3/uL (ref 150–400)
RBC: 2.89 MIL/uL — AB (ref 3.87–5.11)
RDW: 18.2 % — AB (ref 11.5–15.5)
WBC: 3 10*3/uL — AB (ref 4.0–10.5)

## 2014-12-07 LAB — URINALYSIS, ROUTINE W REFLEX MICROSCOPIC
BILIRUBIN URINE: NEGATIVE
Glucose, UA: NEGATIVE mg/dL
HGB URINE DIPSTICK: NEGATIVE
KETONES UR: NEGATIVE mg/dL
NITRITE: NEGATIVE
PROTEIN: NEGATIVE mg/dL
Specific Gravity, Urine: 1.012 (ref 1.005–1.030)
UROBILINOGEN UA: 0.2 mg/dL (ref 0.0–1.0)
pH: 7 (ref 5.0–8.0)

## 2014-12-07 LAB — BASIC METABOLIC PANEL
ANION GAP: 6 (ref 5–15)
BUN: <5 mg/dL — ABNORMAL LOW (ref 6–23)
CALCIUM: 8 mg/dL — AB (ref 8.4–10.5)
CO2: 23 mmol/L (ref 19–32)
CREATININE: 0.49 mg/dL — AB (ref 0.50–1.10)
Chloride: 111 mEq/L (ref 96–112)
GFR calc non Af Amer: >90 mL/min (ref >90)
Glucose, Bld: 93 mg/dL (ref 70–99)
Potassium: 3.1 mmol/L — ABNORMAL LOW (ref 3.5–5.1)
Sodium: 140 mmol/L (ref 135–145)

## 2014-12-07 LAB — URINE MICROSCOPIC-ADD ON

## 2014-12-07 LAB — PREPARE RBC (CROSSMATCH)

## 2014-12-07 MED ORDER — SODIUM CHLORIDE 0.9 % IV SOLN
Freq: Once | INTRAVENOUS | Status: AC
  Administered 2014-12-07: 09:00:00 via INTRAVENOUS

## 2014-12-07 MED ORDER — MORPHINE SULFATE 10 MG/5ML PO SOLN
10.0000 mg | ORAL | Status: DC | PRN
  Administered 2014-12-08 – 2014-12-11 (×11): 10 mg via ORAL
  Filled 2014-12-07 (×12): qty 5

## 2014-12-07 MED ORDER — PIPERACILLIN-TAZOBACTAM 3.375 G IVPB
3.3750 g | Freq: Three times a day (TID) | INTRAVENOUS | Status: DC
  Administered 2014-12-07 – 2014-12-09 (×8): 3.375 g via INTRAVENOUS
  Filled 2014-12-07 (×9): qty 50

## 2014-12-07 MED ORDER — POTASSIUM CHLORIDE IN NACL 40-0.9 MEQ/L-% IV SOLN
INTRAVENOUS | Status: DC
  Administered 2014-12-07 – 2014-12-10 (×6): 125 mL/h via INTRAVENOUS
  Filled 2014-12-07 (×15): qty 1000

## 2014-12-07 MED ORDER — SODIUM CHLORIDE 0.9 % IV SOLN
INTRAVENOUS | Status: DC
  Administered 2014-12-07: 14:00:00 via INTRAVENOUS

## 2014-12-07 NOTE — Progress Notes (Signed)
ANTIBIOTIC CONSULT NOTE - INITIAL  Pharmacy Consult for Zosyn Indication: GNRs in blood  Allergies  Allergen Reactions  . Bactrim Itching  . Dapsone Itching  . Orange Fruit Itching  . Peanut-Containing Drug Products Hives  . Shellfish Allergy Hives  . Sulfa Antibiotics Itching    Patient Measurements: Height: 6' (182.9 cm) Weight: 130 lb 6.4 oz (59.149 kg) IBW/kg (Calculated) : 73.1   Vital Signs: Temp: 98.5 F (36.9 C) (12/22 2240) Temp Source: Oral (12/22 2240) BP: 132/83 mmHg (12/22 2240) Pulse Rate: 70 (12/22 2240) Intake/Output from previous day: 12/22 0701 - 12/23 0700 In: 600 [P.O.:600] Out: -  Intake/Output from this shift:    Labs:  Recent Labs  12/05/14 2047 12/05/14 2349 12/06/14 0435  WBC 4.8 3.9* 3.2*  HGB 9.5* 7.7* 7.5*  PLT 299 265 266  CREATININE 0.64  --  0.60   Estimated Creatinine Clearance: 89.8 mL/min (by C-G formula based on Cr of 0.6). No results for input(s): VANCOTROUGH, VANCOPEAK, VANCORANDOM, GENTTROUGH, GENTPEAK, GENTRANDOM, TOBRATROUGH, TOBRAPEAK, TOBRARND, AMIKACINPEAK, AMIKACINTROU, AMIKACIN in the last 72 hours.   Microbiology: Recent Results (from the past 720 hour(s))  Culture, blood (routine x 2)     Status: None (Preliminary result)   Collection Time: 12/05/14 11:44 PM  Result Value Ref Range Status   Specimen Description BLOOD LEFT ANTECUBITAL  Final   Special Requests BOTTLES DRAWN AEROBIC AND ANAEROBIC 5ML  Final   Culture  Setup Time   Final    12/06/2014 04:48 Performed at [facility]    Culture   Final    GRAM NEGATIVE RODS Note: Gram Stain Report Called to,Read Back By and Verified With: BETTY HILL 12/07/14 AT 0015 RIDK Performed at [facility]    Report Status PENDING  Incomplete    Medical History: Past Medical History  Diagnosis Date  . HIV positive   . Hepatitis B     /E-chart  . Microcytic anemia     h/o per E-chart  . Pneumonia 02/2009    bilaterlly; most likely consistent  w/pneumocystis carinii/e-chart  . Noncompliance with medication regimen     /e-chart  . 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 Pyelonephritis     h/o per E-chart  . Thyroid disease   . Depression   . Asthma     inhaler 2xday    Medications:  Scheduled:  . benztropine  1 mg Oral Daily  . elvitegravir-cobicistat-emtricitabine-tenofovir  1 tablet Oral Q breakfast  . feeding supplement (ENSURE COMPLETE)  237 mL Oral BID BM  . fluconazole (DIFLUCAN) IV  200 mg Intravenous Q24H  . gi cocktail  30 mL Oral TID PC & HS  . metoCLOPramide (REGLAN) injection  10 mg Intravenous 3 times per day  . OLANZapine  5 mg Oral QHS  . pantoprazole (PROTONIX) IV  40 mg Intravenous Q12H  . piperacillin-tazobactam (ZOSYN)  IV  3.375 g Intravenous Q8H  . senna  1 tablet Oral BID  . sucralfate  1 g Oral TID WC & HS   Infusions:  . sodium chloride 125 mL/hr at 12/06/14 0106   Assessment: [name] admitted with intractable vomiting now with GNRs in blood. Zosyn per Rx.  Goal of Therapy:  Treat infection  Plan:   Zosyn 3.375 Gm IV q8h EI  F/u SCr/levels/cultures  Lawana Pai R 12/07/2014,12:52 AM

## 2014-12-07 NOTE — Progress Notes (Addendum)
Patient ID: [name], female   DOB: 08/28/1976, 38 y.o.   MRN: [id number]  [facility] PROGRESS NOTE  NOELENE GANG NWG:[id number] DOB: 30-Oct-1976 DOA: 12/05/2014 PCP: Philis Fendt, MD  Brief narrative:  38 y.o. female with history of esophageal ulcers, HIV and Hep B, last CD 4# in 08/2014 ~ 44, now presented to [facility] ED with main concern of several days duration of progressively worsening epigastric pain, associated with nausea and several episodes of non bloody vomiting, poor oral intake.  Assessment and Plan:    Principal Problem:  Intractable vomiting - this is rather chronic in nature and worse in the setting of poorly controlled HIV from medical non compliance  - CT abd and pelvis with ? Esophagitis (likely also HIV related), will d/w GI if this requires any intervention other than current supportive care  - not sure that there [place] much to do for GI team here, her last EGD was done 07/2104 and per Dr. Ardis Hughs, these ulcers are HIV related and there is really no other treatment for it other than HIV control, which has been a challenge for this pt given underlying non compliance, pt refusing ART treatment here inpatient as well  - continue Protonix IV BID, GI cocktail as that has helped in the past - to my knowledge, pt is getting assistance for HIV treatment - pt CD 4# is still 10 this admit  Active Problems:   Gram - bacteremia - unclear if present on admission - pt with low grade fever this AM 99.5 F, WBC remains low ~3, preliminary blood culture positive for g-rods - source is also unclear - will ask for urinalysis and urine culture - Zosyn started day#1 (12/23)   Hypokalemia - supplement via IV and repeat BMP in AM  Microcytic anemia - last iron level ~10, so also likely HIV related  - transfuse one unit of PRBC 12/23 - repeat CBC in AM  Leukopenia - from HIV  Human immunodeficiency virus (HIV) disease - poorly controlled as noted above - discussed  with pt in detail, pt still non compliant and refusing medications here in the hospital  - per ID recommendations, needs palliative care consult as still severely immunosuppressed and non compliant  - unfortunately no further recommendations per GI or ID team in terms of treatment   Bipolar disorder - appears stable at this time   Esophageal stricture - this is the primary cause for her dysphagia and also noted on EGD in 07/2014 - again primary focus is HIV treatment at this time  - place on Fluconazole IV for now given oral thrush   Protein-calorie malnutrition, severe - secondary to advanced HIV, progressive and uncontrolled - advance diet as pt able to tolerate   Right adnexae 3.5 cm cystic structure - possible fluid within the pelvis is related to rupture of ovarian cyst - will d/w surgery if any indication for intervention   DVT prophylaxis  SCD's  Code Status: Full Family Communication: Pt at bedside Disposition Plan: Home when medically stable   IV Access:    Peripheral IV Procedures and diagnostic studies:    Dg Chest 2 View 12/05/2014 No active cardiopulmonary disease.  Ct Abdomen Pelvis W Contrast  12/07/2014  Thickening of the distal esophagus, question esophagitis.  Interval development of adenopathy upper abdomen (portacaval/ peripancreatic region) of questionable etiology. It is possible this is related to underlying inflammatory process. Malignancy of indeterminate etiology is a secondary less likely consideration.  Fluid within the pelvis.  Within the fluid, there is a right adnexae 3.5 cm cystic structure. It is possible fluid within the pelvis is related to rupture of ovarian cyst. Right ovarian cystic structure can be evaluated with followup ultrasound.  Although there is minimal haziness of fat planes surrounding portions of small bowel and colon, no discrete primary colonic or small bowel inflammatory process is noted.  Medical Consultants:    None   Other Consultants:    None  Anti-Infectives:    Zosyn 12/23 --> (for g- rods in blood cultures)  Faye Ramsay, MD  [facility] Pager [phone]  If 7PM-7AM, please contact night-coverage [email or web address] Password [facility] 12/07/2014, 11:43 AM   LOS: 2 days   HPI/Subjective: No events overnight.   Objective: Filed Vitals:   12/06/14 2240 12/07/14 0500 12/07/14 0832 12/07/14 0907  BP: 132/83 151/96 131/79 140/88  Pulse: 70 89 72 75  Temp: 98.5 F (36.9 C) 98.6 F (37 C) 98.7 F (37.1 C) 98.7 F (37.1 C)  TempSrc: Oral Oral Oral Oral  Resp: 16 16 16 18   Height:      Weight:      SpO2: 100% 100% 100% 99%    Intake/Output Summary (Last 24 hours) at 12/07/14 1143 Last data filed at 12/07/14 2094  Gross per 24 hour  Intake 3844.17 ml  Output      0 ml  Net 3844.17 ml    Exam:   General:  Pt is alert, follows commands appropriately, not in acute distress  Cardiovascular: Regular rate and rhythm, S1/S2, no murmurs, no rubs, no gallops  Respiratory: Clear to auscultation bilaterally, no wheezing, no crackles, no rhonchi  Abdomen: Soft, tender in epigastric area, non distended, bowel sounds present, no guarding  Data Reviewed: Basic Metabolic Panel:  Recent Labs Lab 12/05/14 2047 12/06/14 0435 12/07/14 0540  NA 141 144 140  K 3.6* 3.4* 3.1*  CL 98 110 111  CO2 21 24 23   GLUCOSE 112* 87 93  BUN 12 11 <5*  CREATININE 0.64 0.60 0.49*  CALCIUM 9.8 8.2* 8.0*   Liver Function Tests:  Recent Labs Lab 12/06/14 0435  AST 16  15  ALT 9  9  ALKPHOS 70  68  BILITOT 0.5  0.5  PROT 6.2  6.4  ALBUMIN 3.1*  3.1*    Recent Labs Lab 12/06/14 0435  LIPASE 40   CBC:  Recent Labs Lab 12/05/14 2047 12/05/14 2349 12/06/14 0435 12/07/14 0540  WBC 4.8 3.9* 3.2* 3.0*  NEUTROABS  --  3.3  --   --   HGB 9.5* 7.7* 7.5* 6.5*  HCT 30.0* 24.8* 24.6* 20.4*  MCV 70.1* 70.9* 71.5* 70.6*  PLT 299 265 266 210   Cardiac Enzymes:  Recent Labs Lab  12/06/14 0435  TROPONINI <0.03     Recent Results (from the past 240 hour(s))  Culture, blood (routine x 2)     Status: None (Preliminary result)   Collection Time: 12/05/14 11:44 PM  Result Value Ref Range Status   Specimen Description BLOOD LEFT ANTECUBITAL  Final   Special Requests BOTTLES DRAWN AEROBIC AND ANAEROBIC 5ML  Final   Culture  Setup Time   Final    12/06/2014 04:48 Performed at [facility]    Culture   Final    GRAM NEGATIVE RODS Note: Gram Stain Report Called to,Read Back By and Verified With: BETTY HILL 12/07/14 AT [facility] Performed at [facility]    Report Status PENDING  Incomplete     Scheduled Meds: .  benztropine  1 mg Oral Daily  . elvitegravir-cobicistat-emtricitabine-tenofovir  1 tablet Oral Q breakfast  . fluconazole (DIFLUCAN) IV  200 mg Intravenous Q24H  . gi cocktail  30 mL Oral TID PC & HS  . metoCLOPramide (REGLAN) injection  10 mg Intravenous 3 times per day  . OLANZapine  5 mg Oral QHS  . pantoprazole (PROTONIX) IV  40 mg Intravenous Q12H  . piperacillin-tazobactam (ZOSYN)  IV  3.375 g Intravenous Q8H  . senna  1 tablet Oral BID  . sucralfate  1 g Oral TID WC & HS   Continuous Infusions: . 0.9 % NaCl with KCl 40 mEq / L

## 2014-12-07 NOTE — Consult Note (Signed)
Patient DD:UKGURKY JACOLYN JOAQUIN      DOB: [birth year]      HCW:[id number]     Consult Note from the Palliative Medicine Team at [place] Requested by:  Dr Doyle Askew     PCP: Philis Fendt, MD Reason for Consultation: [place]     Phone Number:[phone]  Assessment/Recommendations: 38 yo female with HIV, medication non-compliance, HIV related esophageal ulcers with strictures who presented with esophageal pain, N/V.    1.  Code Status: Full   2. Goals of Care: Extremely difficult situation.  Reports that she has been non-compliant because of difficulty swallowing. States she wants to take her HIV meds, knows their importance, but wishes something could be done to make it easier to swallow. She follows with Dr Jeanie Cooks as PCP and sees HIV clinic. She states compliance with appts except for a recent missed appt (because she did not receive a call about it).  This seems to be a discrepancy from chart.  She is seeking 2nd GI opinion, but I am not sure what they would offer given recent EGD with HIV related ulcers and strictures.  We talked about other physician concerns related to her HIV being in End-Stages, but this is somewhat frustrating to her. Admits to not taking HIV meds since October. Has been told that her med non-compliance is life threatening. She is not interested in pursuing hospice care and wishes for a better fix for her situation.  I suspect this will be ongoing issue when she leaves the hospital.  She has little social support at home.  She cares for her 54 and 26 year old sons but does not have other help or close friends.  I attempted to engage her in thinking about who would make decisions for her if she were to get sicker, but her response is that this is not going to happen.    Will follow-up tomorrow.   3. Symptom Management:   1. Chest Pain: related to esophageal ulcers.  Will try liquid morphine which can sometimes have topical effect. Watch for burning.  Will have IV  as backup. Agree with GI cocktail, carafate, IV PPI.   I checked [place] CSRS and she has not received pain med script since Sept.  2. Nausea: IV reglan, also zyprexa helps with nausea, PRN zofran 3. Wt loss: 180 lbs in January, now down to 130. 2/2 above. G-tube would not make sense if she can't take HIV meds.  Would HIV meds be able to be delivered via PEG? Not sure she would agree to this.     4. Psychosocial/Spiritual: Lives at home with 59 and 52 year old sons. Denies any other support system. has some sisters whom she does not appear to get along with.  No close friends. Goes to church, but does not associate with anyone there.  Does not want chaplain support here.       Brief HPI: 38 yo female with PMHx of HIV, Hep B, Bipolar D/O, esophageal ulcers, strictures who presented with Abd pain. N/V.  History of medication non-compliance and concern over missed follow-up appts.  Severe dysphagia which she blames for med non-compliance.  Esophageal ulcers felt 2/2 HIV with associated strictures.  Low CD 4 count for "years".  Decreased PO intake and really only taking in liquids and very soft foods. Food often makes her more sick to her stomach.  Sharp pains in chest without real burning sensation. Some relief from IV morphine but does not  last long. Feels like interventions here have helped some but not to point where she can take large pills.  Does not move bowels often because she doesn't eat much.    Wonders if some intervention needs to be done, not sure what.  Wants something to coat her esophagus or fix stricture. Frustrated that people tell her that this requires her taking HIV meds.  Admits to feeling in a catch-22.        PMH:  Past Medical History  Diagnosis Date  . HIV positive   . Hepatitis B     /E-chart  . Microcytic anemia     h/o per E-chart  . 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 Noncompliance with medication regimen      /e-chart  . 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 Pyelonephritis     h/o per E-chart  . Thyroid disease   . Depression   . Asthma     inhaler 2xday     PSH: Past Surgical History  Procedure Laterality Date  . Thyroid surgery    . Thyroid lobectomy  08/2002    left & isthmectomy; for benign thyroid adenoma/E-chart  . Tubal ligation  07/2002    /E-chart  . Esophagogastroduodenoscopy N/A 07/13/2013    Procedure: ESOPHAGOGASTRODUODENOSCOPY (EGD);  Surgeon: Beryle Beams, MD;  Location: [facility];  Service: Endoscopy;  Laterality: N/A;  . Esophagogastroduodenoscopy N/A 08/12/2014    Procedure: ESOPHAGOGASTRODUODENOSCOPY (EGD);  Surgeon: Milus Banister, MD;  Location: [facility];  Service: Endoscopy;  Laterality: N/A;   I have reviewed the [place] and [facility] and  If appropriate update it with new information. Allergies  Allergen Reactions  . Bactrim Itching  . Dapsone Itching  . Orange Fruit Itching  . Peanut-Containing Drug Products Hives  . Shellfish Allergy Hives  . Sulfa Antibiotics Itching   Scheduled Meds: . benztropine  1 mg Oral Daily  . elvitegravir-cobicistat-emtricitabine-tenofovir  1 tablet Oral Q breakfast  . feeding supplement (ENSURE COMPLETE)  237 mL Oral BID BM  . fluconazole (DIFLUCAN) IV  200 mg Intravenous Q24H  . gi cocktail  30 mL Oral TID PC & HS  . metoCLOPramide (REGLAN) injection  10 mg Intravenous 3 times per day  . OLANZapine  5 mg Oral QHS  . pantoprazole (PROTONIX) IV  40 mg Intravenous Q12H  . piperacillin-tazobactam (ZOSYN)  IV  3.375 g Intravenous Q8H  . senna  1 tablet Oral BID  . sucralfate  1 g Oral TID WC & HS   Continuous Infusions: . sodium chloride 75 mL/hr at 12/07/14 1356  . 0.9 % NaCl with KCl 40 mEq / L 125 mL/hr (12/07/14 1153)   PRN Meds:.acetaminophen **OR** acetaminophen, morphine, morphine injection, ondansetron **OR** ondansetron (ZOFRAN) IV, polyethylene glycol    BP 131/83 mmHg  Pulse 79   Temp(Src) 98.9 F (37.2 C) (Oral)  Resp 19  Ht 6' (1.829 m)  Wt 59.149 kg (130 lb 6.4 oz)  BMI 17.68 kg/m2  SpO2 100%  LMP 11/05/2014 (Approximate)   PPS:80   Intake/Output Summary (Last 24 hours) at 12/07/14 1644 Last data filed at 12/07/14 0852  Gross per 24 hour  Intake 3844.17 ml  Output      0 ml  Net 3844.17 ml    Physical Exam:  General: Alert, NAD Skin: warm/dry  Labs: CBC    Component Value Date/Time   WBC 3.0* 12/07/2014 0540  RBC 2.89* 12/07/2014 0540   RBC 3.43* 12/06/2014 0435   HGB 6.5* 12/07/2014 0540   HCT 20.4* 12/07/2014 0540   PLT 210 12/07/2014 0540   MCV 70.6* 12/07/2014 0540   MCH 22.5* 12/07/2014 0540   MCHC 31.9 12/07/2014 0540   RDW 18.2* 12/07/2014 0540   LYMPHSABS 0.3* 12/05/2014 2349   MONOABS 0.3 12/05/2014 2349   EOSABS 0.0 12/05/2014 2349   BASOSABS 0.0 12/05/2014 2349    BMET    Component Value Date/Time   NA 140 12/07/2014 0540   K 3.1* 12/07/2014 0540   CL 111 12/07/2014 0540   CO2 23 12/07/2014 0540   GLUCOSE 93 12/07/2014 0540   GLUCOSE 93 06/11/2010   BUN <5* 12/07/2014 0540   CREATININE 0.49* 12/07/2014 0540   CREATININE 0.55 10/04/2014 1540   CALCIUM 8.0* 12/07/2014 0540   GFRNONAA >90 12/07/2014 0540   GFRAA >90 12/07/2014 0540    CMP     Component Value Date/Time   NA 140 12/07/2014 0540   K 3.1* 12/07/2014 0540   CL 111 12/07/2014 0540   CO2 23 12/07/2014 0540   GLUCOSE 93 12/07/2014 0540   GLUCOSE 93 06/11/2010   BUN <5* 12/07/2014 0540   CREATININE 0.49* 12/07/2014 0540   CREATININE 0.55 10/04/2014 1540   CALCIUM 8.0* 12/07/2014 0540   PROT 6.2 12/06/2014 0435   PROT 6.4 12/06/2014 0435   ALBUMIN 3.1* 12/06/2014 0435   ALBUMIN 3.1* 12/06/2014 0435   AST 16 12/06/2014 0435   AST 15 12/06/2014 0435   ALT 9 12/06/2014 0435   ALT 9 12/06/2014 0435   ALKPHOS 70 12/06/2014 0435   ALKPHOS 68 12/06/2014 0435   BILITOT 0.5 12/06/2014 0435   BILITOT 0.5 12/06/2014 0435   GFRNONAA >90 12/07/2014  0540   GFRAA >90 12/07/2014 0540   12/23 CT ABD/Pelvis IMPRESSION: Thickening of the distal esophagus, question esophagitis.  Interval development of adenopathy upper abdomen (portacaval/ peripancreatic region) of questionable etiology. It is possible this is related to underlying inflammatory process. Malignancy of indeterminate etiology is a secondary less likely consideration.  Fluid within the pelvis. Within the fluid, there is a right adnexae 3.5 cm cystic structure. It is possible fluid within the pelvis is related to rupture of ovarian cyst. Right ovarian cystic structure can be evaluated with followup ultrasound.  Although there is minimal haziness of fat planes surrounding portions of small bowel and colon, no discrete primary colonic or small bowel inflammatory process is noted. Appendix is unremarkable.   12/23 Pelvic US IMPRESSION: 1. There is a complex appearing non hypervascular region within the substance of the right ovary which is nonspecific. This could reflect an endometrioma, hemorrhagic cysts, or dermoid. Follow-up ultrasound in 6-12 weeks is recommended. 2. Simple appearing cysts in the left ovary. 3. The uterus is unremarkable.    Total Time: 70 minutes   Greater than 50%  of this time was spent counseling and coordinating care related to the above assessment and plan.   Doran Clay D.O. Palliative Medicine Team at [facility]  Pager: [phone] Team Phone: [phone]

## 2014-12-08 DIAGNOSIS — R112 Nausea with vomiting, unspecified: Secondary | ICD-10-CM | POA: Insufficient documentation

## 2014-12-08 LAB — CBC
HEMATOCRIT: 22.3 % — AB (ref 36.0–46.0)
Hemoglobin: 7.4 g/dL — ABNORMAL LOW (ref 12.0–15.0)
MCH: 23.8 pg — ABNORMAL LOW (ref 26.0–34.0)
MCHC: 33.2 g/dL (ref 30.0–36.0)
MCV: 71.7 fL — ABNORMAL LOW (ref 78.0–100.0)
Platelets: 211 10*3/uL (ref 150–400)
RBC: 3.11 MIL/uL — ABNORMAL LOW (ref 3.87–5.11)
RDW: 18.8 % — AB (ref 11.5–15.5)
WBC: 3.7 10*3/uL — AB (ref 4.0–10.5)

## 2014-12-08 LAB — TYPE AND SCREEN
ABO/RH(D): A POS
ANTIBODY SCREEN: NEGATIVE
Unit division: 0

## 2014-12-08 LAB — BASIC METABOLIC PANEL
ANION GAP: 4 — AB (ref 5–15)
CO2: 24 mmol/L (ref 19–32)
Calcium: 7.8 mg/dL — ABNORMAL LOW (ref 8.4–10.5)
Chloride: 111 mEq/L (ref 96–112)
Creatinine, Ser: 0.51 mg/dL (ref 0.50–1.10)
GFR calc Af Amer: >90 mL/min (ref >90)
Glucose, Bld: 92 mg/dL (ref 70–99)
Potassium: 2.8 mmol/L — ABNORMAL LOW (ref 3.5–5.1)
Sodium: 139 mmol/L (ref 135–145)

## 2014-12-08 LAB — URINE CULTURE
Colony Count: NO GROWTH
Culture: NO GROWTH

## 2014-12-08 LAB — MAGNESIUM: Magnesium: 1.6 mg/dL (ref 1.5–2.5)

## 2014-12-08 LAB — HEMOGLOBIN AND HEMATOCRIT, BLOOD
HEMATOCRIT: 22.6 % — AB (ref 36.0–46.0)
Hemoglobin: 7.5 g/dL — ABNORMAL LOW (ref 12.0–15.0)

## 2014-12-08 MED ORDER — MORPHINE SULFATE 2 MG/ML IJ SOLN
2.0000 mg | INTRAMUSCULAR | Status: DC | PRN
  Administered 2014-12-08 – 2014-12-11 (×13): 2 mg via INTRAVENOUS
  Filled 2014-12-08 (×14): qty 1

## 2014-12-08 MED ORDER — MAGNESIUM SULFATE 2 GM/50ML IV SOLN
2.0000 g | Freq: Once | INTRAVENOUS | Status: AC
  Administered 2014-12-08: 2 g via INTRAVENOUS
  Filled 2014-12-08: qty 50

## 2014-12-08 MED ORDER — DIPHENHYDRAMINE HCL 25 MG PO CAPS
25.0000 mg | ORAL_CAPSULE | Freq: Every evening | ORAL | Status: DC | PRN
  Administered 2014-12-11: 25 mg via ORAL
  Filled 2014-12-08: qty 1

## 2014-12-08 MED ORDER — POTASSIUM CHLORIDE 10 MEQ/100ML IV SOLN
10.0000 meq | INTRAVENOUS | Status: AC
  Administered 2014-12-08 (×6): 10 meq via INTRAVENOUS
  Filled 2014-12-08 (×6): qty 100

## 2014-12-08 NOTE — Progress Notes (Signed)
Patient [name]      DOB: 10/09/1976      ZHY:[id number]   Palliative Medicine Team at [facility] Progress Note    Subjective: Morphine solution helped pain. Nausea doing better today.  Able to swallow HIV meds this morning and tolerating soft foods/liquids.  No other acute complaints.      Filed Vitals:   12/08/14 0519  BP: 139/91  Pulse: 72  Temp: 98.4 F (36.9 C)  Resp: 16   Physical exam: GEN: alert, NAD SKIN: warm/dry Psych: appropriate mood/affect   CBC    Component Value Date/Time   WBC 3.7* 12/08/2014 0403   RBC 3.11* 12/08/2014 0403   RBC 3.43* 12/06/2014 0435   HGB 7.4* 12/08/2014 0403   HCT 22.3* 12/08/2014 0403   PLT 211 12/08/2014 0403   MCV 71.7* 12/08/2014 0403   MCH 23.8* 12/08/2014 0403   MCHC 33.2 12/08/2014 0403   RDW 18.8* 12/08/2014 0403   LYMPHSABS 0.3* 12/05/2014 2349   MONOABS 0.3 12/05/2014 2349   EOSABS 0.0 12/05/2014 2349   BASOSABS 0.0 12/05/2014 2349    CMP     Component Value Date/Time   NA 139 12/08/2014 0403   K 2.8* 12/08/2014 0403   CL 111 12/08/2014 0403   CO2 24 12/08/2014 0403   GLUCOSE 92 12/08/2014 0403   GLUCOSE 93 06/11/2010   BUN <5* 12/08/2014 0403   CREATININE 0.51 12/08/2014 0403   CREATININE 0.55 10/04/2014 1540   CALCIUM 7.8* 12/08/2014 0403   PROT 6.2 12/06/2014 0435   PROT 6.4 12/06/2014 0435   ALBUMIN 3.1* 12/06/2014 0435   ALBUMIN 3.1* 12/06/2014 0435   AST 16 12/06/2014 0435   AST 15 12/06/2014 0435   ALT 9 12/06/2014 0435   ALT 9 12/06/2014 0435   ALKPHOS 70 12/06/2014 0435   ALKPHOS 68 12/06/2014 0435   BILITOT 0.5 12/06/2014 0435   BILITOT 0.5 12/06/2014 0435   GFRNONAA >90 12/08/2014 0403   GFRAA >90 12/08/2014 0403      Assessment and plan: 38 yo female with HIV, medication non-compliance, HIV related esophageal ulcers with strictures who presented with esophageal pain, N/V.   1. Code Status: Full   2. Goals of Care: See previous note.  Case discussed with Dr Doyle Askew  this morning. Taking HIV pills now. May need liquid morphine as outpatient to tolerate.  Suspect at d/c she will follow-up with ID and attempt to get 2nd GI opinion.  If still inpatient Monday we will fu.    3. Symptom Management:  1. Chest/Esophageal Pain: continue current regimen.  Liquid morphine seems to helps her swallowing 2. Nausea: IV reglan, also zyprexa helps with nausea, PRN zofran 3. Wt loss: 180 lbs in January, now down to 130. 2/2 above. G-tube would not make sense if she can't take HIV meds. Would HIV meds be able to be delivered via PEG? Not sure she would agree to this.   4. Psychosocial/Spiritual: Lives at home with 4 and 60 year old sons. Denies any other support system. has some sisters whom she does not appear to get along with. No close friends. Goes to church, but does not associate with anyone there. Does not want chaplain support here.    Doran Clay D.O. Palliative Medicine Team at [facility]  Pager: [phone] Team Phone: [phone]

## 2014-12-08 NOTE — Progress Notes (Signed)
Patient ID: [name], female   DOB: 28-Sep-1976, 38 y.o.   MRN: [id number]  [facility] PROGRESS NOTE  [name] IOE:[id number] DOB: Jan 27, 1976 DOA: 12/05/2014 PCP: Philis Fendt, MD  Brief narrative: 38 y.o. female with history of esophageal ulcers, HIV and Hep B, last CD 4# in 08/2014 ~ 90, now presented to [facility] ED with main concern of several days duration of progressively worsening epigastric pain, associated with nausea and several episodes of non bloody vomiting, poor oral intake.  Assessment and Plan:    Principal Problem:  Intractable vomiting - this is rather chronic in nature and worse in the setting of poorly controlled HIV from medical non compliance  - CT abd and pelvis with ? Esophagitis (likely also HIV related), will d/w GI if this requires any intervention other than current supportive care, not sure that there [place] much to do for GI team here, her last EGD was done 07/2104 and per Dr. Ardis Hughs, these ulcers are HIV related and there is really no other treatment for it other than HIV control, which has been a challenge for this pt given underlying non compliance, pt refusing ART treatment here inpatient as well saying it hurts to swallow  - continue Protonix IV BID, GI cocktail as that has helped in the past - to my knowledge, pt is getting assistance for HIV treatment - pt CD 4# is still 10 this admit  - pt reports feeling better this AM and wants to have diet advanced to regular  Active Problems:  Gram - bacteremia - unclear if present on admission - pt with low grade fever AM 12/23 99.5 F, WBC remains low ~3, preliminary blood culture positive for g-rods - source is also unclear - UA with small leukoc, fairly unremarkable, urine culture pending  - Zosyn started 12/23, continue day #2 until final cultures back   Hypokalemia - supplement via IV and repeat BMP in AM  Microcytic anemia - last iron level ~10, so also likely HIV related  - transfused  one unit of PRBC 12/23 - repeat CBC in AM  Leukopenia - from HIV  Human immunodeficiency virus (HIV) disease - poorly controlled as noted above - discussed with pt in detail, pt still non compliant and refusing medications here in the hospital  - pt very clear she is willing to continue taking medication but her concern is still difficulty and pain with swallowing  - unfortunately no further recommendations per GI or ID team in terms of treatment   Bipolar disorder - appears stable at this time   Esophageal stricture - this is the primary cause for her dysphagia and also noted on EGD in 07/2014 - again primary focus is HIV treatment at this time  - placed on Fluconazole IV for now given oral thrush   Protein-calorie malnutrition, severe - secondary to advanced HIV, progressive and uncontrolled - advance diet as pt able to tolerate  Right adnexae 3.5 cm cystic structure - possible fluid within the pelvis is related to rupture of ovarian cyst - no intervention indicated at this time, abd benign at this time  - repeat US in 4-6 weeks if needed   DVT prophylaxis  SCD's  Code Status: Full Family Communication: Pt at bedside Disposition Plan: Home when medically stable   IV Access:    Peripheral IV Procedures and diagnostic studies:    Dg Chest 2 View 12/05/2014 No active cardiopulmonary disease.   Ct Abdomen Pelvis W Contrast 12/07/2014 Thickening of the  distal esophagus, question esophagitis. Interval development of adenopathy upper abdomen (portacaval/ peripancreatic region) of questionable etiology. It is possible this is related to underlying inflammatory process. Malignancy of indeterminate etiology is a secondary less likely consideration. Fluid within the pelvis. Within the fluid, there is a right adnexae 3.5 cm cystic structure. It is possible fluid within the pelvis is related to rupture of ovarian cyst. Right ovarian cystic structure can be  evaluated with followup ultrasound. Although there is minimal haziness of fat planes surrounding portions of small bowel and colon, no discrete primary colonic or small bowel inflammatory process is noted.  Medical Consultants:    PCT Other Consultants:    None  Anti-Infectives:    Zosyn 12/23 --> (for g- rods in blood cultures)  MAGICK-MYERS, Gillie Manners, MD  [facility] Pager [phone]  If 7PM-7AM, please contact night-coverage [email or web address] Password [email or web address] 12/08/2014, 2:09 PM   LOS: 3 days   HPI/Subjective: No events overnight.   Objective: Filed Vitals:   12/07/14 1130 12/07/14 1402 12/07/14 2121 12/08/14 0519  BP: 129/82 131/83 137/96 139/91  Pulse: 90 79 70 72  Temp: 98.6 F (37 C) 98.9 F (37.2 C) 98.6 F (37 C) 98.4 F (36.9 C)  TempSrc: Oral Oral Oral Oral  Resp: 18 19 18 16   Height:      Weight:      SpO2: 100% 100% 100% 100%    Intake/Output Summary (Last 24 hours) at 12/08/14 1409 Last data filed at 12/08/14 0533  Gross per 24 hour  Intake 2051.25 ml  Output      0 ml  Net 2051.25 ml    Exam:   General:  Pt is alert, follows commands appropriately, not in acute distress  Cardiovascular: Regular rate and rhythm, S1/S2, no murmurs, no rubs, no gallops  Respiratory: Clear to auscultation bilaterally, no wheezing, no crackles, no rhonchi  Abdomen: Soft, non tender, non distended, bowel sounds present, no guarding  Data Reviewed: Basic Metabolic Panel:  Recent Labs Lab 12/05/14 2047 12/06/14 0435 12/07/14 0540 12/08/14 0403  NA 141 144 140 139  K 3.6* 3.4* 3.1* 2.8*  CL 98 110 111 111  CO2 21 24 23 24   GLUCOSE 112* 87 93 92  BUN 12 11 <5* <5*  CREATININE 0.64 0.60 0.49* 0.51  CALCIUM 9.8 8.2* 8.0* 7.8*  MG  --   --   --  1.6   Liver Function Tests:  Recent Labs Lab 12/06/14 0435  AST 16  15  ALT 9  9  ALKPHOS 70  68  BILITOT 0.5  0.5  PROT 6.2  6.4  ALBUMIN 3.1*  3.1*    Recent Labs Lab  12/06/14 0435  LIPASE 40   No results for input(s): AMMONIA in the last 168 hours. CBC:  Recent Labs Lab 12/05/14 2047 12/05/14 2349 12/06/14 0435 12/07/14 0540 12/08/14 0007 12/08/14 0403  WBC 4.8 3.9* 3.2* 3.0*  --  3.7*  NEUTROABS  --  3.3  --   --   --   --   HGB 9.5* 7.7* 7.5* 6.5* 7.5* 7.4*  HCT 30.0* 24.8* 24.6* 20.4* 22.6* 22.3*  MCV 70.1* 70.9* 71.5* 70.6*  --  71.7*  PLT 299 265 266 210  --  211   Cardiac Enzymes:  Recent Labs Lab 12/06/14 0435  TROPONINI <0.03     Recent Results (from the past 240 hour(s))  Culture, blood (routine x 2)     Status: None (Preliminary result)   Collection Time: 12/05/14  11:44 PM  Result Value Ref Range Status   Specimen Description BLOOD LEFT ANTECUBITAL  Final   Special Requests BOTTLES DRAWN AEROBIC AND ANAEROBIC 5ML  Final   Culture  Setup Time   Final    12/06/2014 04:48 Performed at [facility]    Culture   Final    GRAM NEGATIVE RODS Note: Gram Stain Report Called to,Read Back By and Verified With: BETTY HILL 12/07/14 AT [place] Performed at [facility]    Report Status PENDING  Incomplete     Scheduled Meds: . benztropine  1 mg Oral Daily  . elvitegravir-cobicistat-emtricitabine-tenofovir  1 tablet Oral Q breakfast  . feeding supplement (ENSURE COMPLETE)  237 mL Oral BID BM  . fluconazole (DIFLUCAN) IV  200 mg Intravenous Q24H  . gi cocktail  30 mL Oral TID PC & HS  . metoCLOPramide (REGLAN) injection  10 mg Intravenous 3 times per day  . OLANZapine  5 mg Oral QHS  . pantoprazole (PROTONIX) IV  40 mg Intravenous Q12H  . piperacillin-tazobactam (ZOSYN)  IV  3.375 g Intravenous Q8H  . potassium chloride  10 mEq Intravenous Q1 Hr x 6  . senna  1 tablet Oral BID  . sucralfate  1 g Oral TID WC & HS   Continuous Infusions: . sodium chloride 75 mL/hr at 12/07/14 1356  . 0.9 % NaCl with KCl 40 mEq / L 125 mL/hr (12/08/14 1257)

## 2014-12-09 LAB — BASIC METABOLIC PANEL
Anion gap: 3 — ABNORMAL LOW (ref 5–15)
CALCIUM: 7.6 mg/dL — AB (ref 8.4–10.5)
CO2: 22 mmol/L (ref 19–32)
Chloride: 111 mEq/L (ref 96–112)
Creatinine, Ser: 0.58 mg/dL (ref 0.50–1.10)
GFR calc Af Amer: >90 mL/min (ref >90)
GLUCOSE: 105 mg/dL — AB (ref 70–99)
Potassium: 4.1 mmol/L (ref 3.5–5.1)
SODIUM: 136 mmol/L (ref 135–145)

## 2014-12-09 LAB — CBC
HCT: 24.6 % — ABNORMAL LOW (ref 36.0–46.0)
Hemoglobin: 7.8 g/dL — ABNORMAL LOW (ref 12.0–15.0)
MCH: 23 pg — ABNORMAL LOW (ref 26.0–34.0)
MCHC: 31.7 g/dL (ref 30.0–36.0)
MCV: 72.6 fL — ABNORMAL LOW (ref 78.0–100.0)
Platelets: 252 10*3/uL (ref 150–400)
RBC: 3.39 MIL/uL — ABNORMAL LOW (ref 3.87–5.11)
RDW: 19.1 % — AB (ref 11.5–15.5)
WBC: 3 10*3/uL — ABNORMAL LOW (ref 4.0–10.5)

## 2014-12-09 MED ORDER — CEFTRIAXONE SODIUM IN DEXTROSE 40 MG/ML IV SOLN
2.0000 g | INTRAVENOUS | Status: DC
  Administered 2014-12-09 – 2014-12-10 (×2): 2 g via INTRAVENOUS
  Filled 2014-12-09 (×3): qty 50

## 2014-12-09 NOTE — Progress Notes (Signed)
Received call from lab. on pt's blood cultures drawn 12/21 reporting salmonella species. MD Paged and notified. Will continue to monitor.

## 2014-12-09 NOTE — Progress Notes (Signed)
Patient ID: [name], female   DOB: Sep 21, 1976, 38 y.o.   MRN: [id number]  [facility] PROGRESS NOTE  [name] QMG:[id number] DOB: 01/09/1976 DOA: 12/05/2014 PCP: Philis Fendt, MD   Brief narrative: 38 y.o. female with history of esophageal ulcers, HIV and Hep B, last CD 4# in 08/2014 ~ 72, now presented to [facility] ED with main concern of several days duration of progressively worsening epigastric pain, associated with nausea and several episodes of non bloody vomiting, poor oral intake.  Assessment and Plan:    Principal Problem:  Intractable vomiting - this is rather chronic in nature and worse in the setting of poorly controlled HIV from medical non compliance  - CT abd and pelvis with ? Esophagitis (likely also HIV related) - no further intervention required per GI team, no need for repeat EGD - continue Protonix IV BID, GI cocktail as that has helped in the past - pt CD 4# is still 10 this admit  Active Problems:  Gram - bacteremia - blood cultures positive for Salmonella  - GI stool panel pending - d/w ID on call, can change ABX to Rocephin  - Zosyn started 12/23, will transition to Rocephin 12/25 - placed on contact precautions   Hypokalemia - supplemented and WNL this AM  Microcytic anemia - last iron level ~10, so also likely HIV related  - transfused one unit of PRBC 12/23  Leukopenia - from HIV  Human immunodeficiency virus (HIV) disease - poorly controlled as noted above - discussed with pt in detail, pt still non compliant and refusing medications here in the hospital  - pt very clear she is willing to continue taking medication but her concern is still difficulty and pain with swallowing   Bipolar disorder - appears stable at this time   Esophageal stricture - this is the primary cause for her dysphagia and also noted on EGD in 07/2014 - again primary focus is HIV treatment at this time  - placed on Fluconazole IV for now  given oral thrush   Protein-calorie malnutrition, severe - secondary to advanced HIV, progressive and uncontrolled - advance diet as pt able to tolerate  Right adnexae 3.5 cm cystic structure - possible fluid within the pelvis is related to rupture of ovarian cyst - no intervention indicated at this time, abd benign at this time  - repeat US in 4-6 weeks if needed   DVT prophylaxis  SCD's  Code Status: Full Family Communication: Pt at bedside Disposition Plan: Home when medically stable   IV Access:    Peripheral IV Procedures and diagnostic studies:    Dg Chest 2 View 12/05/2014 No active cardiopulmonary disease.   Ct Abdomen Pelvis W Contrast 12/07/2014 Thickening of the distal esophagus, question esophagitis. Interval development of adenopathy upper abdomen (portacaval/ peripancreatic region) of questionable etiology. It is possible this is related to underlying inflammatory process. Malignancy of indeterminate etiology is a secondary less likely consideration. Fluid within the pelvis. Within the fluid, there is a right adnexae 3.5 cm cystic structure. It is possible fluid within the pelvis is related to rupture of ovarian cyst. Right ovarian cystic structure can be evaluated with followup ultrasound. Although there is minimal haziness of fat planes surrounding portions of small bowel and colon, no discrete primary colonic or small bowel inflammatory process is noted.  Medical Consultants:    PCT Other Consultants:    None  Anti-Infectives:    Zosyn 12/23 --> (for g- rods in blood cultures) 12/25  Rocephin 12/25 -->   Faye Ramsay, MD  [facility] Pager [phone]  If 7PM-7AM, please contact night-coverage [email or web address] Password [facility] 12/09/2014, 1:53 PM   LOS: 4 days   HPI/Subjective: No events overnight.   Objective: Filed Vitals:   12/08/14 1416 12/08/14 2112 12/09/14 0436 12/09/14 1331  BP: 119/85 113/79 137/83 129/81  Pulse:  82 73 64 76  Temp: 98.2 F (36.8 C) 98.2 F (36.8 C) 98 F (36.7 C) 99 F (37.2 C)  TempSrc: Oral Oral Oral Oral  Resp: 16 16 16 16   Height:      Weight:      SpO2: 100% 100% 100% 100%    Intake/Output Summary (Last 24 hours) at 12/09/14 1353 Last data filed at 12/09/14 0840  Gross per 24 hour  Intake 5774.58 ml  Output      0 ml  Net 5774.58 ml    Exam:   General:  Pt is alert, follows commands appropriately, not in acute distress  Cardiovascular: Regular rate and rhythm, S1/S2, no murmurs, no rubs, no gallops  Respiratory: Clear to auscultation bilaterally, no wheezing, no crackles, no rhonchi  Abdomen: Soft, tender in epigastric area, non distended, bowel sounds present, no guarding   Data Reviewed: Basic Metabolic Panel:  Recent Labs Lab 12/05/14 2047 12/06/14 0435 12/07/14 0540 12/08/14 0403 12/09/14 0430  NA 141 144 140 139 136  K 3.6* 3.4* 3.1* 2.8* 4.1  CL 98 110 111 111 111  CO2 21 24 23 24 22   GLUCOSE 112* 87 93 92 105*  BUN 12 11 <5* <5* <5*  CREATININE 0.64 0.60 0.49* 0.51 0.58  CALCIUM 9.8 8.2* 8.0* 7.8* 7.6*  MG  --   --   --  1.6  --    Liver Function Tests:  Recent Labs Lab 12/06/14 0435  AST 16  15  ALT 9  9  ALKPHOS 70  68  BILITOT 0.5  0.5  PROT 6.2  6.4  ALBUMIN 3.1*  3.1*    Recent Labs Lab 12/06/14 0435  LIPASE 40   CBC:  Recent Labs Lab 12/05/14 2349 12/06/14 0435 12/07/14 0540 12/08/14 0007 12/08/14 0403 12/09/14 0430  WBC 3.9* 3.2* 3.0*  --  3.7* 3.0*  NEUTROABS 3.3  --   --   --   --   --   HGB 7.7* 7.5* 6.5* 7.5* 7.4* 7.8*  HCT 24.8* 24.6* 20.4* 22.6* 22.3* 24.6*  MCV 70.9* 71.5* 70.6*  --  71.7* 72.6*  PLT 265 266 210  --  211 252   Cardiac Enzymes:  Recent Labs Lab 12/06/14 0435  TROPONINI <0.03     Recent Results (from the past 240 hour(s))  Culture, blood (routine x 2)     Status: None (Preliminary result)   Collection Time: 12/05/14 11:44 PM  Result Value Ref Range Status    Specimen Description BLOOD LEFT ANTECUBITAL  Final   Special Requests BOTTLES DRAWN AEROBIC AND ANAEROBIC 5ML  Final   Culture  Setup Time   Final    12/06/2014 04:48 Performed at [facility]    Culture   Final    SALMONELLA SPECIES Note: Gram Stain Report Called to,Read Back By and Verified With: BETTY HILL 12/07/14 AT 0015 RIDK CRITICAL RESULT CALLED TO, READ BACK BY AND VERIFIED WITH: ESTER EDGAL 12/09/14 @ 8:01PM BY RUSCOE A. Performed at [facility]    Report Status PENDING  Incomplete   Organism ID, Bacteria SALMONELLA SPECIES  Final  Susceptibility   Salmonella species - MIC*    AMPICILLIN <2.0 SENSITIVE Sensitive     TRIMETH/SULFA <20 SENSITIVE Sensitive     * SALMONELLA SPECIES  Culture, Urine     Status: None   Collection Time: 12/07/14  2:41 PM  Result Value Ref Range Status   Specimen Description URINE, RANDOM  Final   Special Requests NONE  Final   Culture  Setup Time   Final    12/08/2014 00:37 Performed at [place] Performed at [facility]   Final   Culture NO GROWTH Performed at [facility]   Final   Report Status 12/08/2014 FINAL  Final     Scheduled Meds: . benztropine  1 mg Oral Daily  . elvitegravir-cobicistat-emtricitabine-tenofovir  1 tablet Oral Q breakfast  . feeding supplement (ENSURE COMPLETE)  237 mL Oral BID BM  . fluconazole (DIFLUCAN) IV  200 mg Intravenous Q24H  . gi cocktail  30 mL Oral TID PC & HS  . metoCLOPramide (REGLAN) injection  10 mg Intravenous 3 times per day  . OLANZapine  5 mg Oral QHS  . pantoprazole (PROTONIX) IV  40 mg Intravenous Q12H  . piperacillin-tazobactam (ZOSYN)  IV  3.375 g Intravenous Q8H  . senna  1 tablet Oral BID  . sucralfate  1 g Oral TID WC & HS   Continuous Infusions: . 0.9 % NaCl with KCl 40 mEq / L 125 mL/hr (12/09/14 1049)

## 2014-12-09 NOTE — Progress Notes (Signed)
ANTIBIOTIC CONSULT NOTE - INITIAL  Pharmacy Consult for ceftriaxone Indication: Salmonella bacteremia  Allergies  Allergen Reactions  . Bactrim Itching  . Dapsone Itching  . Orange Fruit Itching  . Peanut-Containing Drug Products Hives  . Shellfish Allergy Hives  . Sulfa Antibiotics Itching    Patient Measurements: Height: 6' (182.9 cm) Weight: 130 lb 6.4 oz (59.149 kg) IBW/kg (Calculated) : 73.1   Medical History: Past Medical History  Diagnosis Date  . HIV positive   . Hepatitis B     /E-chart  . Microcytic anemia     h/o per E-chart  . 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 Noncompliance with medication regimen     /e-chart  . 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 Pyelonephritis     h/o per E-chart  . Thyroid disease   . Depression   . Asthma     inhaler 2xday    Medications:  Scheduled:  . benztropine  1 mg Oral Daily  . elvitegravir-cobicistat-emtricitabine-tenofovir  1 tablet Oral Q breakfast  . feeding supplement (ENSURE COMPLETE)  237 mL Oral BID BM  . fluconazole (DIFLUCAN) IV  200 mg Intravenous Q24H  . gi cocktail  30 mL Oral TID PC & HS  . metoCLOPramide (REGLAN) injection  10 mg Intravenous 3 times per day  . OLANZapine  5 mg Oral QHS  . pantoprazole (PROTONIX) IV  40 mg Intravenous Q12H  . senna  1 tablet Oral BID  . sucralfate  1 g Oral TID WC & HS   Infusions:  . 0.9 % NaCl with KCl 40 mEq / L 125 mL/hr (12/09/14 1049)   Assessment: 39 yoF , HIV + admitted 12/22 with intractable vomiting. Pharmacy originally consulted to dose Zosyn, patient now with Salmonella species growing in blood culture. Pharmacy is consulted to change Zosyn to ceftriaxone.   Antiinfectives 12/23 >> Zosyn>> 12/25 12/22 >> fluconazole (MD) >> 12/25 >> ceftriaxone >>  Labs / vitals Tmax: afebrile WBCs: low, stable Renal: WNL stable   Microbiology 12/21 blood: Salmonella sp (S=amp,  bactrim) 12/25 blood x2: IP 12/25 C.diff PCR: ordered 12/25 stool: ordered  Goal of Therapy:  ceftriaxone per indication and organ function  Plan:  - stop Zosyn - start ceftriaxone 2g IV q24h to start at 1600 (when next Zosyn dose was due) - follow-up clinical course, culture results, renal function - follow-up antibiotic de-escalation and length of therapy  Thank you for the consult.  Currie Paris, PharmD, BCPS Pager: [phone] Pharmacy: [phone] 12/09/2014 2:32 PM

## 2014-12-10 DIAGNOSIS — Z9114 Patient's other noncompliance with medication regimen: Secondary | ICD-10-CM | POA: Insufficient documentation

## 2014-12-10 DIAGNOSIS — R7881 Bacteremia: Secondary | ICD-10-CM | POA: Insufficient documentation

## 2014-12-10 DIAGNOSIS — B20 Human immunodeficiency virus [HIV] disease: Principal | ICD-10-CM

## 2014-12-10 DIAGNOSIS — Z91148 Patient's other noncompliance with medication regimen for other reason: Secondary | ICD-10-CM | POA: Insufficient documentation

## 2014-12-10 DIAGNOSIS — A029 Salmonella infection, unspecified: Secondary | ICD-10-CM

## 2014-12-10 LAB — CBC
HCT: 24.3 % — ABNORMAL LOW (ref 36.0–46.0)
Hemoglobin: 8.1 g/dL — ABNORMAL LOW (ref 12.0–15.0)
MCH: 23.8 pg — ABNORMAL LOW (ref 26.0–34.0)
MCHC: 33.3 g/dL (ref 30.0–36.0)
MCV: 71.5 fL — AB (ref 78.0–100.0)
Platelets: 234 10*3/uL (ref 150–400)
RBC: 3.4 MIL/uL — ABNORMAL LOW (ref 3.87–5.11)
RDW: 19.3 % — ABNORMAL HIGH (ref 11.5–15.5)
WBC: 4.5 10*3/uL (ref 4.0–10.5)

## 2014-12-10 LAB — BASIC METABOLIC PANEL
Anion gap: 4 — ABNORMAL LOW (ref 5–15)
CO2: 23 mmol/L (ref 19–32)
Calcium: 8.2 mg/dL — ABNORMAL LOW (ref 8.4–10.5)
Chloride: 108 mEq/L (ref 96–112)
Creatinine, Ser: 0.54 mg/dL (ref 0.50–1.10)
Glucose, Bld: 96 mg/dL (ref 70–99)
Potassium: 4.2 mmol/L (ref 3.5–5.1)
Sodium: 135 mmol/L (ref 135–145)

## 2014-12-10 LAB — CLOSTRIDIUM DIFFICILE BY PCR: CDIFFPCR: NEGATIVE

## 2014-12-10 NOTE — Progress Notes (Signed)
Patient ID: [name], female   DOB: 09-05-1976, 38 y.o.   MRN: [id number]  [facility] PROGRESS NOTE  [name] VOZ:[id number] DOB: 1976/10/17 DOA: 12/05/2014 PCP: Philis Fendt, MD  Brief narrative: 38 y.o. female with history of esophageal ulcers, HIV and Hep B, last CD 4# in 08/2014 ~ 12, now presented to [facility] ED with main concern of several days duration of progressively worsening epigastric pain, associated with nausea and several episodes of non bloody vomiting, poor oral intake.  Assessment and Plan:    Principal Problem:  Intractable vomiting - this is rather chronic in nature and worse in the setting of poorly controlled HIV from medical non compliance  - CT abd and pelvis with ? Esophagitis (likely also HIV related) - no further intervention required per GI team, no need for repeat EGD - continue Protonix IV BID, GI cocktail as that has helped in the past - pt CD 4# is still 10 this admit  Active Problems:  Gram - bacteremia - blood cultures positive for Salmonella  - GI stool panel pending - d/w ID on call, continue Rocephin day #2 - Zosyn started 12/23, transitioned to Rocephin 12/25 - placed on contact precautions   Hypokalemia - supplemented and WNL this AM  Microcytic anemia - last iron level ~10, so also likely HIV related  - transfused one unit of PRBC 12/23 - Hg remains stable over the past 48 hours   Leukopenia - from HIV, WBC WNL this AM  Human immunodeficiency virus (HIV) disease - poorly controlled as noted above - discussed with pt in detail, pt still non compliant and refusing medications here in the hospital  - pt very clear she is willing to continue taking medication but her concern is still difficulty and pain with swallowing  - we have spent time discussing compliance and pt appreciated our concern, she understands how crucial it is to take that medicine  - she has two kids and understands she needs to get back on  track, focus, take better care of herself   Bipolar disorder - appears stable at this time   Esophageal stricture - this is the primary cause for her dysphagia and also noted on EGD in 07/2014 - again primary focus is HIV treatment at this time  - Fluconazole IV for now given oral thrush   Protein-calorie malnutrition, severe - secondary to advanced HIV, progressive and uncontrolled - tolerating regular diet well   Right adnexae 3.5 cm cystic structure - possible fluid within the pelvis is related to rupture of ovarian cyst - no intervention indicated at this time, abd benign at this time  - repeat US in 4-6 weeks if needed   DVT prophylaxis  SCD's  Code Status: Full Family Communication: Pt at bedside Disposition Plan: Home when medically stable   IV Access:    Peripheral IV Procedures and diagnostic studies:    Dg Chest 2 View 12/05/2014 No active cardiopulmonary disease.   Ct Abdomen Pelvis W Contrast 12/07/2014 Thickening of the distal esophagus, question esophagitis. Interval development of adenopathy upper abdomen (portacaval/ peripancreatic region) of questionable etiology. It is possible this is related to underlying inflammatory process. Malignancy of indeterminate etiology is a secondary less likely consideration. Fluid within the pelvis. Within the fluid, there is a right adnexae 3.5 cm cystic structure. It is possible fluid within the pelvis is related to rupture of ovarian cyst. Right ovarian cystic structure can be evaluated with followup ultrasound. Although there is minimal haziness  of fat planes surrounding portions of small bowel and colon, no discrete primary colonic or small bowel inflammatory process is noted.  Medical Consultants:    PCT Other Consultants:    None  Anti-Infectives:    Zosyn 12/23 --> 12/25  Rocephin 12/25 -->   Faye Ramsay, MD  [facility] Pager [phone]  If 7PM-7AM, please contact  night-coverage [email or web address] Password [email or web address] 12/10/2014, 11:28 AM   LOS: 5 days   HPI/Subjective: No events overnight.   Objective: Filed Vitals:   12/09/14 0436 12/09/14 1331 12/09/14 2143 12/10/14 0457  BP: 137/83 129/81 114/72 116/86  Pulse: 64 76 76 92  Temp: 98 F (36.7 C) 99 F (37.2 C) 98.5 F (36.9 C) 98.6 F (37 C)  TempSrc: Oral Oral Oral Oral  Resp: 16 16 16 18   Height:      Weight:      SpO2: 100% 100% 100% 100%    Intake/Output Summary (Last 24 hours) at 12/10/14 1128 Last data filed at 12/10/14 1015  Gross per 24 hour  Intake    240 ml  Output      0 ml  Net    240 ml    Exam:   General:  Pt is alert, follows commands appropriately, not in acute distress  Cardiovascular: Regular rate and rhythm, S1/S2, no murmurs, no rubs, no gallops  Respiratory: Clear to auscultation bilaterally, no wheezing, no crackles, no rhonchi  Abdomen: Soft, non tender, non distended, bowel sounds present, no guarding  Data Reviewed: Basic Metabolic Panel:  Recent Labs Lab 12/06/14 0435 12/07/14 0540 12/08/14 0403 12/09/14 0430 12/10/14 0531  NA 144 140 139 136 135  K 3.4* 3.1* 2.8* 4.1 4.2  CL 110 111 111 111 108  CO2 24 23 24 22 23   GLUCOSE 87 93 92 105* 96  BUN 11 <5* <5* <5* <5*  CREATININE 0.60 0.49* 0.51 0.58 0.54  CALCIUM 8.2* 8.0* 7.8* 7.6* 8.2*  MG  --   --  1.6  --   --    Liver Function Tests:  Recent Labs Lab 12/06/14 0435  AST 16  15  ALT 9  9  ALKPHOS 70  68  BILITOT 0.5  0.5  PROT 6.2  6.4  ALBUMIN 3.1*  3.1*    Recent Labs Lab 12/06/14 0435  LIPASE 40   CBC:  Recent Labs Lab 12/05/14 2349 12/06/14 0435 12/07/14 0540 12/08/14 0007 12/08/14 0403 12/09/14 0430 12/10/14 0531  WBC 3.9* 3.2* 3.0*  --  3.7* 3.0* 4.5  NEUTROABS 3.3  --   --   --   --   --   --   HGB 7.7* 7.5* 6.5* 7.5* 7.4* 7.8* 8.1*  HCT 24.8* 24.6* 20.4* 22.6* 22.3* 24.6* 24.3*  MCV 70.9* 71.5* 70.6*  --  71.7* 72.6* 71.5*  PLT 265 266 210  --  211  252 234   Cardiac Enzymes:  Recent Labs Lab 12/06/14 0435  TROPONINI <0.03    Recent Results (from the past 240 hour(s))  Culture, blood (routine x 2)     Status: None (Preliminary result)   Collection Time: 12/05/14 11:44 PM  Result Value Ref Range Status   Specimen Description BLOOD LEFT ANTECUBITAL  Final   Special Requests BOTTLES DRAWN AEROBIC AND ANAEROBIC 5ML  Final   Culture  Setup Time   Final    12/06/2014 04:48 Performed at [facility]    Culture   Final    SALMONELLA SPECIES Note: Gram Stain Report Called to,Read  Back By and Verified With: BETTY HILL 12/07/14 AT 0015 RIDK CRITICAL RESULT CALLED TO, READ BACK BY AND VERIFIED WITH: ESTER EDGAL 12/09/14 @ 8:01PM BY RUSCOE A. Performed at [facility]    Report Status PENDING  Incomplete   Organism ID, Bacteria SALMONELLA SPECIES  Final      Susceptibility   Salmonella species - MIC*    AMPICILLIN <2.0 SENSITIVE Sensitive     TRIMETH/SULFA <20 SENSITIVE Sensitive     CIPROFLOXACIN <0.25 SENSITIVE Sensitive     * SALMONELLA SPECIES  Culture, Urine     Status: None   Collection Time: 12/07/14  2:41 PM  Result Value Ref Range Status   Specimen Description URINE, RANDOM  Final   Special Requests NONE  Final   Culture  Setup Time   Final    12/08/2014 00:37 Performed at [place] Performed at [facility]   Final   Culture NO GROWTH Performed at [facility]   Final   Report Status 12/08/2014 FINAL  Final  Clostridium Difficile by PCR     Status: None   Collection Time: 12/09/14  4:18 PM  Result Value Ref Range Status   C difficile by pcr NEGATIVE NEGATIVE Final    Comment: Performed at [facility]     Scheduled Meds: . benztropine  1 mg Oral Daily  . cefTRIAXone (ROCEPHIN)  IV  2 g Intravenous Q24H  . elvitegravir-cobicistat-emtricitabine-tenofovir  1 tablet Oral Q breakfast  . feeding supplement (ENSURE COMPLETE)  237 mL Oral  BID BM  . fluconazole (DIFLUCAN) IV  200 mg Intravenous Q24H  . gi cocktail  30 mL Oral TID PC & HS  . metoCLOPramide (REGLAN) injection  10 mg Intravenous 3 times per day  . OLANZapine  5 mg Oral QHS  . pantoprazole (PROTONIX) IV  40 mg Intravenous Q12H  . senna  1 tablet Oral BID  . sucralfate  1 g Oral TID WC & HS   Continuous Infusions: . 0.9 % NaCl with KCl 40 mEq / L 125 mL/hr (12/09/14 1931)

## 2014-12-10 NOTE — Consult Note (Signed)
[facility]    Date of Admission:  12/05/2014  Date of Consult:  12/10/2014  Reason for Consult: Salmonella bacteremia Referring Physician: : Dr. Doyle Askew   HPI: [name] is an 38 y.o. female noncompliant pt with HIV/AIDS, whom I saw in August. I have written a note but it has since been deleted in epic. She has been admitted with Salmonella bacteremia and is noncompliant with her ARVs.   Past Medical History  Diagnosis Date  . HIV positive   . Hepatitis B     /E-chart  . Microcytic anemia     h/o per E-chart  . 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 Noncompliance with medication regimen     /e-chart  . 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 Pyelonephritis     h/o per E-chart  . Thyroid disease   . Depression   . Asthma     inhaler 2xday    Past Surgical History  Procedure Laterality Date  . Thyroid surgery    . Thyroid lobectomy  08/2002    left & isthmectomy; for benign thyroid adenoma/E-chart  . Tubal ligation  07/2002    /E-chart  . Esophagogastroduodenoscopy N/A 07/13/2013    Procedure: ESOPHAGOGASTRODUODENOSCOPY (EGD);  Surgeon: Beryle Beams, MD;  Location: [facility];  Service: Endoscopy;  Laterality: N/A;  . Esophagogastroduodenoscopy N/A 08/12/2014    Procedure: ESOPHAGOGASTRODUODENOSCOPY (EGD);  Surgeon: Milus Banister, MD;  Location: [facility];  Service: Endoscopy;  Laterality: N/A;  ergies:   Allergies  Allergen Reactions  . Bactrim Itching  . Dapsone Itching  . Orange Fruit Itching  . Peanut-Containing Drug Products Hives  . Shellfish Allergy Hives  . Sulfa Antibiotics Itching     Medications: I have reviewed patients current medications as documented in Epic Anti-infectives    Start     Dose/Rate Route Frequency Ordered Stop   12/09/14 1600  cefTRIAXone (ROCEPHIN) 2 g in dextrose 5 % 50 mL IVPB - Premix     2 g100 mL/hr over 30  Minutes Intravenous Every 24 hours 12/09/14 1430     12/07/14 0200  piperacillin-tazobactam (ZOSYN) IVPB 3.375 g  Status:  Discontinued     3.375 g12.5 mL/hr over 240 Minutes Intravenous Every 8 hours 12/07/14 0048 12/09/14 1412   12/06/14 1600  fluconazole (DIFLUCAN) IVPB 200 mg     200 mg100 mL/hr over 60 Minutes Intravenous Every 24 hours 12/06/14 1440     12/06/14 0800  elvitegravir-cobicistat-emtricitabine-tenofovir (GENVOYA) 150-150-200-10 MG tablet 1 tablet     1 tablet Oral Daily with breakfast 12/06/14 0113        Social History:  reports that she has never smoked. She has never used smokeless tobacco. She reports that she drinks alcohol. She reports that she does not use illicit drugs.  Family History  Problem Relation Age of Onset  . Cancer Mother   . Diabetes Mother   . Diabetes Father   . Heart disease Father   . Cancer Sister   . Diabetes Sister   . Asthma Son     As in HPI and primary teams notes otherwise 12 point review of systems is negative  Blood pressure 116/86, pulse 92, temperature 98.6 F (37 C), temperature source Oral, resp. rate 18, height 6' (1.829 m), weight 130 lb 6.4 oz (59.149 kg), last menstrual period 11/05/2014, SpO2 100 %. General: cachectic Alert and  awake, oriented x3, not in any acute distress. HEENT: anicteric sclera, pupils reactive to light and accommodation, EOMI, oropharynx clear and without exudate CVS regular rate, normal Chest:  no wheezing, rales or rhonchi Abdomen: soft  Nondistended, Neuro: nonfocal  Results for orders placed or performed during the hospital encounter of 12/05/14 (from the past 48 hour(s))  CBC     Status: Abnormal   Collection Time: 12/09/14  4:30 AM  Result Value Ref Range   WBC 3.0 (L) 4.0 - 10.5 K/uL   RBC 3.39 (L) 3.87 - 5.11 MIL/uL   Hemoglobin 7.8 (L) 12.0 - 15.0 g/dL   HCT 24.6 (L) 36.0 - 46.0 %   MCV 72.6 (L) 78.0 - 100.0 fL   MCH 23.0 (L) 26.0 - 34.0 pg   MCHC 31.7 30.0 - 36.0 g/dL   RDW 19.1 (H)  11.5 - 15.5 %   Platelets 252 150 - 400 K/uL  Basic metabolic panel     Status: Abnormal   Collection Time: 12/09/14  4:30 AM  Result Value Ref Range   Sodium 136 135 - 145 mmol/L    Comment: Please note change in reference range.   Potassium 4.1 3.5 - 5.1 mmol/L    Comment: Please note change in reference range. DELTA CHECK NOTED NO VISIBLE HEMOLYSIS REPEATED TO VERIFY    Chloride 111 96 - 112 mEq/L   CO2 22 19 - 32 mmol/L   Glucose, Bld 105 (H) 70 - 99 mg/dL   BUN <5 (L) 6 - 23 mg/dL   Creatinine, Ser 0.58 0.50 - 1.10 mg/dL   Calcium 7.6 (L) 8.4 - 10.5 mg/dL   GFR calc non Af Amer >90 >90 mL/min   GFR calc Af Amer >90 >90 mL/min    Comment: (NOTE) The eGFR has been calculated using the CKD EPI equation. This calculation has not been validated in all clinical situations. eGFR's persistently <90 mL/min signify possible Chronic Kidney Disease.    Anion gap 3 (L) 5 - 15  Culture, blood (routine x 2)     Status: None (Preliminary result)   Collection Time: 12/09/14 11:43 AM  Result Value Ref Range   Specimen Description BLOOD LEFT ARM    Special Requests BOTTLES DRAWN AEROBIC AND ANAEROBIC 10CC    Culture             BLOOD CULTURE RECEIVED NO GROWTH TO DATE CULTURE WILL BE HELD FOR 5 DAYS BEFORE ISSUING A FINAL NEGATIVE REPORT Performed at [facility]    Report Status PENDING   Culture, blood (routine x 2)     Status: None (Preliminary result)   Collection Time: 12/09/14 12:00 PM  Result Value Ref Range   Specimen Description BLOOD RIGHT ARM    Special Requests BOTTLES DRAWN AEROBIC ONLY 5CC    Culture             BLOOD CULTURE RECEIVED NO GROWTH TO DATE CULTURE WILL BE HELD FOR 5 DAYS BEFORE ISSUING A FINAL NEGATIVE REPORT Performed at [facility]    Report Status PENDING   Clostridium Difficile by PCR     Status: None   Collection Time: 12/09/14  4:18 PM  Result Value Ref Range   C difficile by pcr NEGATIVE NEGATIVE    Comment: Performed at [facility]  CBC     Status: Abnormal   Collection Time: 12/10/14  5:31 AM  Result Value Ref Range   WBC 4.5 4.0 - 10.5 K/uL  RBC 3.40 (L) 3.87 - 5.11 MIL/uL   Hemoglobin 8.1 (L) 12.0 - 15.0 g/dL   HCT 24.3 (L) 36.0 - 46.0 %   MCV 71.5 (L) 78.0 - 100.0 fL   MCH 23.8 (L) 26.0 - 34.0 pg   MCHC 33.3 30.0 - 36.0 g/dL   RDW 19.3 (H) 11.5 - 15.5 %   Platelets 234 150 - 400 K/uL  Basic metabolic panel     Status: Abnormal   Collection Time: 12/10/14  5:31 AM  Result Value Ref Range   Sodium 135 135 - 145 mmol/L    Comment: Please note change in reference range.   Potassium 4.2 3.5 - 5.1 mmol/L    Comment: Please note change in reference range.   Chloride 108 96 - 112 mEq/L   CO2 23 19 - 32 mmol/L   Glucose, Bld 96 70 - 99 mg/dL   BUN <5 (L) 6 - 23 mg/dL   Creatinine, Ser 0.54 0.50 - 1.10 mg/dL   Calcium 8.2 (L) 8.4 - 10.5 mg/dL   GFR calc non Af Amer >90 >90 mL/min   GFR calc Af Amer >90 >90 mL/min    Comment: (NOTE) The eGFR has been calculated using the CKD EPI equation. This calculation has not been validated in all clinical situations. eGFR's persistently <90 mL/min signify possible Chronic Kidney Disease.    Anion gap 4 (L) 5 - 15   @BRIEFLABTABLE (sdes,specrequest,cult,reptstatus)   ) Recent Results (from the past 720 hour(s))  Culture, blood (routine x 2)     Status: None (Preliminary result)   Collection Time: 12/05/14 11:44 PM  Result Value Ref Range Status   Specimen Description BLOOD LEFT ANTECUBITAL  Final   Special Requests BOTTLES DRAWN AEROBIC AND ANAEROBIC 5ML  Final   Culture  Setup Time   Final    12/06/2014 04:48 Performed at [facility]    Culture   Final    SALMONELLA SPECIES Note: Gram Stain Report Called to,Read Back By and Verified With: BETTY HILL 12/07/14 AT 0015 RIDK CRITICAL RESULT CALLED TO, READ BACK BY AND VERIFIED WITH: ESTER EDGAL 12/09/14 @ 8:01PM BY RUSCOE A. Performed at [facility]    Report Status PENDING   Incomplete   Organism ID, Bacteria SALMONELLA SPECIES  Final      Susceptibility   Salmonella species - MIC*    AMPICILLIN <2.0 SENSITIVE Sensitive     TRIMETH/SULFA <20 SENSITIVE Sensitive     CIPROFLOXACIN <0.25 SENSITIVE Sensitive     * SALMONELLA SPECIES  Culture, Urine     Status: None   Collection Time: 12/07/14  2:41 PM  Result Value Ref Range Status   Specimen Description URINE, RANDOM  Final   Special Requests NONE  Final   Culture  Setup Time   Final    12/08/2014 00:37 Performed at [facility] Performed at [facility]   Final   Culture NO GROWTH Performed at [facility]   Final   Report Status 12/08/2014 FINAL  Final  Culture, blood (routine x 2)     Status: None (Preliminary result)   Collection Time: 12/09/14 11:43 AM  Result Value Ref Range Status   Specimen Description BLOOD LEFT ARM  Final   Special Requests BOTTLES DRAWN AEROBIC AND ANAEROBIC 10CC  Final   Culture   Final           BLOOD CULTURE RECEIVED NO GROWTH TO DATE CULTURE WILL  BE HELD FOR 5 DAYS BEFORE ISSUING A FINAL NEGATIVE REPORT Performed at [facility]    Report Status PENDING  Incomplete  Culture, blood (routine x 2)     Status: None (Preliminary result)   Collection Time: 12/09/14 12:00 PM  Result Value Ref Range Status   Specimen Description BLOOD RIGHT ARM  Final   Special Requests BOTTLES DRAWN AEROBIC ONLY 5CC  Final   Culture   Final           BLOOD CULTURE RECEIVED NO GROWTH TO DATE CULTURE WILL BE HELD FOR 5 DAYS BEFORE ISSUING A FINAL NEGATIVE REPORT Performed at [facility]    Report Status PENDING  Incomplete  Clostridium Difficile by PCR     Status: None   Collection Time: 12/09/14  4:18 PM  Result Value Ref Range Status   C difficile by pcr NEGATIVE NEGATIVE Final    Comment: Performed at [facility]     Impression/Recommendation  Principal Problem:   Intractable vomiting Active  Problems:   Human immunodeficiency virus (HIV) disease   Microcytic anemia   Bipolar disorder   Esophageal ulcer   Esophageal stricture   Dehydration   Epigastric pain   HIV (human immunodeficiency virus infection)   Protein-calorie malnutrition, severe   Palliative care encounter   Loss of weight   Nausea and vomiting   [name] is a 37 y.o. female with  Salmonella bactermia and HIV/AIDS and noncompliant  #1 Salmonella bactermia: could indeed by typhoid given trip to DR --continue rocphin --fu blood cultures  #2 HIV/AIDS: states she stopped meds due to inability to swallow pills YET AGAIN (see my last note in [facility] re this) but now she has made a deal with Dr. Doyle Askew to take her meds as long as she takes magic mouthwash (she initially said as long as she takes her pain meds  She wishes to continue with STRIBLD (GENVOYA if her Medicaid covers this now) and HOPEFULLY if she does NOT have Resistance. She will NOT [facility] PCP PROPHYLAXIS  I spent greater than 40 minutes with the patient including greater than 50% of time in face to face counsel of the patient and in coordination of their care.    12/10/2014, 7:34 PM   Thank you so much for this interesting consult  [place] [facility] [place] [phone] (pager) [phone] (office) 12/10/2014, 7:34 PM  [name] 12/10/2014, 7:34 PM

## 2014-12-11 DIAGNOSIS — K209 Esophagitis, unspecified without bleeding: Secondary | ICD-10-CM | POA: Insufficient documentation

## 2014-12-11 DIAGNOSIS — B20 Human immunodeficiency virus [HIV] disease: Secondary | ICD-10-CM | POA: Insufficient documentation

## 2014-12-11 LAB — BASIC METABOLIC PANEL
ANION GAP: 2 — AB (ref 5–15)
BUN: 6 mg/dL (ref 6–23)
CALCIUM: 8.6 mg/dL (ref 8.4–10.5)
CO2: 26 mmol/L (ref 19–32)
Chloride: 105 mEq/L (ref 96–112)
Creatinine, Ser: 0.61 mg/dL (ref 0.50–1.10)
Glucose, Bld: 110 mg/dL — ABNORMAL HIGH (ref 70–99)
Potassium: 5 mmol/L (ref 3.5–5.1)
Sodium: 133 mmol/L — ABNORMAL LOW (ref 135–145)

## 2014-12-11 LAB — CBC
HCT: 26.6 % — ABNORMAL LOW (ref 36.0–46.0)
Hemoglobin: 8.4 g/dL — ABNORMAL LOW (ref 12.0–15.0)
MCH: 22.7 pg — AB (ref 26.0–34.0)
MCHC: 31.6 g/dL (ref 30.0–36.0)
MCV: 71.9 fL — ABNORMAL LOW (ref 78.0–100.0)
PLATELETS: 279 10*3/uL (ref 150–400)
RBC: 3.7 MIL/uL — ABNORMAL LOW (ref 3.87–5.11)
RDW: 19.4 % — AB (ref 11.5–15.5)
WBC: 4.1 10*3/uL (ref 4.0–10.5)

## 2014-12-11 MED ORDER — CIPROFLOXACIN HCL 500 MG PO TABS
500.0000 mg | ORAL_TABLET | Freq: Two times a day (BID) | ORAL | Status: DC
  Administered 2014-12-11 – 2014-12-12 (×2): 500 mg via ORAL
  Filled 2014-12-11 (×3): qty 1

## 2014-12-11 NOTE — Progress Notes (Signed)
Patient ID: [name], female   DOB: 24-Nov-1976, 37 y.o.   MRN: [id number]  [facility] PROGRESS NOTE  [name] [id number] DOB: 26-Jan-1976 DOA: 12/05/2014 PCP: Philis Fendt, MD   Brief narrative: 38 y.o. female with history of esophageal ulcers, HIV and Hep B, last CD 4# in 08/2014 ~ 70, now presented to [facility] ED with main concern of several days duration of progressively worsening epigastric pain, associated with nausea and several episodes of non bloody vomiting, poor oral intake.  Assessment and Plan:    Principal Problem:  Intractable vomiting - this is rather chronic in nature and worse in the setting of poorly controlled HIV from medical non compliance  - CT abd and pelvis with ? Esophagitis (likely also HIV related) - no further intervention required per GI team, no need for repeat EGD - continue Protonix IV BID, GI cocktail as that has helped in the past - pt CD 4# 10 this admit  Active Problems:  Gram - bacteremia - blood cultures positive for Salmonella  - GI stool panel pending - continue Rocephin day #3, appreciate ID assistance  - Zosyn started 12/23, transitioned to Rocephin 12/25 - placed on contact precautions   Hypokalemia - supplemented and WNL this AM  Microcytic anemia - last iron level ~10, so also likely HIV related  - transfused one unit of PRBC 12/23 - Hg remains stable over the past 48 hours   Leukopenia - from HIV, WBC WNL this AM  Human immunodeficiency virus (HIV) disease - poorly controlled as noted above - compliance discussed again this AM - pt very clear she is willing to continue taking medication but her concern is still difficulty and pain with swallowing  - we have spent time discussing compliance and pt appreciated our concern, she understands how crucial it is to take that medicine  - she has two kids and understands she needs to get back on track, focus, take better care of herself   Bipolar  disorder - appears stable at this time   Esophageal stricture - this is the primary cause for her dysphagia and also noted on EGD in 07/2014 - again primary focus is HIV treatment at this time  - Fluconazole IV for now given oral thrush   Protein-calorie malnutrition, severe - secondary to advanced HIV, progressive and uncontrolled - tolerating regular diet well   Right adnexae 3.5 cm cystic structure - possible fluid within the pelvis is related to rupture of ovarian cyst - no intervention indicated at this time, abd benign at this time  - repeat US in 4-6 weeks if needed   DVT prophylaxis  SCD's  Code Status: Full Family Communication: Pt at bedside Disposition Plan: Home when medically stable   IV Access:    Peripheral IV Procedures and diagnostic studies:    Dg Chest 2 View 12/05/2014 No active cardiopulmonary disease.   Ct Abdomen Pelvis W Contrast 12/07/2014 Thickening of the distal esophagus, question esophagitis. Interval development of adenopathy upper abdomen (portacaval/ peripancreatic region) of questionable etiology. It is possible this is related to underlying inflammatory process. Malignancy of indeterminate etiology is a secondary less likely consideration. Fluid within the pelvis. Within the fluid, there is a right adnexae 3.5 cm cystic structure. It is possible fluid within the pelvis is related to rupture of ovarian cyst. Right ovarian cystic structure can be evaluated with followup ultrasound. Although there is minimal haziness of fat planes surrounding portions of small bowel and colon, no discrete primary  colonic or small bowel inflammatory process is noted.  Medical Consultants:    PCT Other Consultants:    None  Anti-Infectives:    Zosyn 12/23 --> 12/25  Rocephin 12/25 -->  Faye Ramsay, MD  [facility] Pager [phone]  If 7PM-7AM, please contact night-coverage [email or web address] Password [email or web address] 12/11/2014,  1:55 PM   LOS: 6 days   HPI/Subjective: No events overnight.   Objective: Filed Vitals:   12/09/14 2143 12/10/14 0457 12/10/14 2207 12/11/14 0616  BP: 114/72 116/86 95/62 131/85  Pulse: 76 92 75 77  Temp: 98.5 F (36.9 C) 98.6 F (37 C) 98.1 F (36.7 C) 98.2 F (36.8 C)  TempSrc: Oral Oral Oral Oral  Resp: 16 18 18 18   Height:      Weight:      SpO2: 100% 100% 100% 100%    Intake/Output Summary (Last 24 hours) at 12/11/14 1355 Last data filed at 12/11/14 0618  Gross per 24 hour  Intake   1860 ml  Output      0 ml  Net   1860 ml    Exam:   General:  Pt is alert, follows commands appropriately, not in acute distress  Cardiovascular: Regular rate and rhythm, S1/S2, no murmurs, no rubs, no gallops  Respiratory: Clear to auscultation bilaterally, no wheezing, no crackles, no rhonchi  Abdomen: Soft, non tender, non distended, bowel sounds present, no guarding  Data Reviewed: Basic Metabolic Panel:  Recent Labs Lab 12/07/14 0540 12/08/14 0403 12/09/14 0430 12/10/14 0531 12/11/14 0506  NA 140 139 136 135 133*  K 3.1* 2.8* 4.1 4.2 5.0  CL 111 111 111 108 105  CO2 23 24 22 23 26   GLUCOSE 93 92 105* 96 110*  BUN <5* <5* <5* <5* 6  CREATININE 0.49* 0.51 0.58 0.54 0.61  CALCIUM 8.0* 7.8* 7.6* 8.2* 8.6  MG  --  1.6  --   --   --    Liver Function Tests:  Recent Labs Lab 12/06/14 0435  AST 16  15  ALT 9  9  ALKPHOS 70  68  BILITOT 0.5  0.5  PROT 6.2  6.4  ALBUMIN 3.1*  3.1*    Recent Labs Lab 12/06/14 0435  LIPASE 40   CBC:  Recent Labs Lab 12/05/14 2349  12/07/14 0540 12/08/14 0007 12/08/14 0403 12/09/14 0430 12/10/14 0531 12/11/14 0506  WBC 3.9*  < > 3.0*  --  3.7* 3.0* 4.5 4.1  NEUTROABS 3.3  --   --   --   --   --   --   --   HGB 7.7*  < > 6.5* 7.5* 7.4* 7.8* 8.1* 8.4*  HCT 24.8*  < > 20.4* 22.6* 22.3* 24.6* 24.3* 26.6*  MCV 70.9*  < > 70.6*  --  71.7* 72.6* 71.5* 71.9*  PLT 265  < > 210  --  211 252 234 279  < > = values in  this interval not displayed. Cardiac Enzymes:  Recent Labs Lab 12/06/14 0435  TROPONINI <0.03     Recent Results (from the past 240 hour(s))  Culture, blood (routine x 2)     Status: None (Preliminary result)   Collection Time: 12/05/14 11:44 PM  Result Value Ref Range Status   Specimen Description BLOOD LEFT ANTECUBITAL  Final   Special Requests BOTTLES DRAWN AEROBIC AND ANAEROBIC 5ML  Final   Culture  Setup Time   Final    12/06/2014 04:48 Performed at [facility]  Final    SALMONELLA SPECIES Note: Gram Stain Report Called to,Read Back By and Verified With: BETTY HILL 12/07/14 AT 0015 RIDK CRITICAL RESULT CALLED TO, READ BACK BY AND VERIFIED WITH: ESTER EDGAL 12/09/14 @ 8:01PM BY RUSCOE A. Performed at [facility]    Report Status PENDING  Incomplete   Organism ID, Bacteria SALMONELLA SPECIES  Final      Susceptibility   Salmonella species - MIC*    AMPICILLIN <2.0 SENSITIVE Sensitive     TRIMETH/SULFA <20 SENSITIVE Sensitive     CIPROFLOXACIN <0.25 SENSITIVE Sensitive     * SALMONELLA SPECIES  Culture, Urine     Status: None   Collection Time: 12/07/14  2:41 PM  Result Value Ref Range Status   Specimen Description URINE, RANDOM  Final   Special Requests NONE  Final   Culture  Setup Time   Final    12/08/2014 00:37 Performed at [place] Performed at [facility]   Final   Culture NO GROWTH Performed at [facility]   Final   Report Status 12/08/2014 FINAL  Final  Culture, blood (routine x 2)     Status: None (Preliminary result)   Collection Time: 12/09/14 11:43 AM  Result Value Ref Range Status   Specimen Description BLOOD LEFT ARM  Final   Special Requests BOTTLES DRAWN AEROBIC AND ANAEROBIC 10CC  Final   Culture   Final           BLOOD CULTURE RECEIVED NO GROWTH TO DATE CULTURE WILL BE HELD FOR 5 DAYS BEFORE ISSUING A FINAL NEGATIVE REPORT Performed at [facility]    Report Status PENDING  Incomplete  Culture, blood (routine x 2)     Status: None (Preliminary result)   Collection Time: 12/09/14 12:00 PM  Result Value Ref Range Status   Specimen Description BLOOD RIGHT ARM  Final   Special Requests BOTTLES DRAWN AEROBIC ONLY 5CC  Final   Culture   Final           BLOOD CULTURE RECEIVED NO GROWTH TO DATE CULTURE WILL BE HELD FOR 5 DAYS BEFORE ISSUING A FINAL NEGATIVE REPORT Performed at [facility]    Report Status PENDING  Incomplete  Clostridium Difficile by PCR     Status: None   Collection Time: 12/09/14  4:18 PM  Result Value Ref Range Status   C difficile by pcr NEGATIVE NEGATIVE Final    Comment: Performed at [facility]     Scheduled Meds: . benztropine  1 mg Oral Daily  . cefTRIAXone (ROCEPHIN)  IV  2 g Intravenous Q24H  . elvitegravir-cobicistat-emtricitabine-tenofovir  1 tablet Oral Q breakfast  . feeding supplement (ENSURE COMPLETE)  237 mL Oral BID BM  . fluconazole (DIFLUCAN) IV  200 mg Intravenous Q24H  . gi cocktail  30 mL Oral TID PC & HS  . metoCLOPramide (REGLAN) injection  10 mg Intravenous 3 times per day  . OLANZapine  5 mg Oral QHS  . pantoprazole (PROTONIX) IV  40 mg Intravenous Q12H  . senna  1 tablet Oral BID  . sucralfate  1 g Oral TID WC & HS   Continuous Infusions: . 0.9 % NaCl with KCl 40 mEq / L 125 mL/hr (12/10/14 2125)

## 2014-12-11 NOTE — Progress Notes (Signed)
[name] for Infectious Disease    Subjective: Still having some epigastric pain   Antibiotics:  Anti-infectives    Start     Dose/Rate Route Frequency Ordered Stop   12/11/14 2000  ciprofloxacin (CIPRO) tablet 500 mg     500 mg Oral 2 times daily 12/11/14 1416     12/09/14 1600  cefTRIAXone (ROCEPHIN) 2 g in dextrose 5 % 50 mL IVPB - Premix  Status:  Discontinued     2 g100 mL/hr over 30 Minutes Intravenous Every 24 hours 12/09/14 1430 12/11/14 1416   12/07/14 0200  piperacillin-tazobactam (ZOSYN) IVPB 3.375 g  Status:  Discontinued     3.375 g12.5 mL/hr over 240 Minutes Intravenous Every 8 hours 12/07/14 0048 12/09/14 1412   12/06/14 1600  fluconazole (DIFLUCAN) IVPB 200 mg     200 mg100 mL/hr over 60 Minutes Intravenous Every 24 hours 12/06/14 1440     12/06/14 0800  elvitegravir-cobicistat-emtricitabine-tenofovir (GENVOYA) 150-150-200-10 MG tablet 1 tablet     1 tablet Oral Daily with breakfast 12/06/14 0113        Medications: Scheduled Meds: . benztropine  1 mg Oral Daily  . ciprofloxacin  500 mg Oral BID  . elvitegravir-cobicistat-emtricitabine-tenofovir  1 tablet Oral Q breakfast  . feeding supplement (ENSURE COMPLETE)  237 mL Oral BID BM  . fluconazole (DIFLUCAN) IV  200 mg Intravenous Q24H  . gi cocktail  30 mL Oral TID PC & HS  . metoCLOPramide (REGLAN) injection  10 mg Intravenous 3 times per day  . OLANZapine  5 mg Oral QHS  . pantoprazole (PROTONIX) IV  40 mg Intravenous Q12H  . senna  1 tablet Oral BID  . sucralfate  1 g Oral TID WC & HS   Continuous Infusions:  PRN Meds:.acetaminophen **OR** acetaminophen, diphenhydrAMINE, morphine, morphine injection, ondansetron **OR** ondansetron (ZOFRAN) IV, polyethylene glycol    Objective: Weight change:   Intake/Output Summary (Last 24 hours) at 12/11/14 1610 Last data filed at 12/11/14 0618  Gross per 24 hour  Intake   1620 ml  Output      0 ml  Net   1620 ml   Blood pressure 131/85, pulse 77,  temperature 98.2 F (36.8 C), temperature source Oral, resp. rate 18, height 6' (1.829 m), weight 130 lb 6.4 oz (59.149 kg), last menstrual period 11/05/2014, SpO2 100 %. Temp:  [98.1 F (36.7 C)-98.2 F (36.8 C)] 98.2 F (36.8 C) (12/27 0616) Pulse Rate:  [75-77] 77 (12/27 0616) Resp:  [18] 18 (12/27 0616) BP: (95-131)/(62-85) 131/85 mmHg (12/27 0616) SpO2:  [100 %] 100 % (12/27 0616)  Physical Exam: General: Alert and awake, oriented x3, not in any acute distress. HEENT: anicteric sclera,EOMI CVS regular rate,  Chest: , no wheezing resp distress Abdomen:  nondistended Neuro: nonfocal  CBC:  Recent Labs Lab 12/06/14 0435 12/07/14 0540 12/08/14 0007 12/08/14 0403 12/09/14 0430 12/10/14 0531 12/11/14 0506  HGB 7.5* 6.5* 7.5* 7.4* 7.8* 8.1* 8.4*  HCT 24.6* 20.4* 22.6* 22.3* 24.6* 24.3* 26.6*  PLT 266 210  --  211 252 234 279  INR 1.09  --   --   --   --   --   --      BMET  Recent Labs  12/10/14 0531 12/11/14 0506  NA 135 133*  K 4.2 5.0  CL 108 105  CO2 23 26  GLUCOSE 96 110*  BUN <5* 6  CREATININE 0.54 0.61  CALCIUM 8.2* 8.6     Liver Panel  No results for input(s): PROT, ALBUMIN, AST, ALT, ALKPHOS, BILITOT, BILIDIR, IBILI in the last 72 hours.     Sedimentation Rate No results for input(s): ESRSEDRATE in the last 72 hours. C-Reactive Protein No results for input(s): CRP in the last 72 hours.  Micro Results: Recent Results (from the past 720 hour(s))  Culture, blood (routine x 2)     Status: None (Preliminary result)   Collection Time: 12/05/14 11:44 PM  Result Value Ref Range Status   Specimen Description BLOOD LEFT ANTECUBITAL  Final   Special Requests BOTTLES DRAWN AEROBIC AND ANAEROBIC 5ML  Final   Culture  Setup Time   Final    12/06/2014 04:48 Performed at [facility]    Culture   Final    SALMONELLA SPECIES Note: Gram Stain Report Called to,Read Back By and Verified With: BETTY HILL 12/07/14 AT 0015 RIDK CRITICAL RESULT  CALLED TO, READ BACK BY AND VERIFIED WITH: ESTER EDGAL 12/09/14 @ 8:01PM BY RUSCOE A. Performed at [facility]    Report Status PENDING  Incomplete   Organism ID, Bacteria SALMONELLA SPECIES  Final      Susceptibility   Salmonella species - MIC*    AMPICILLIN <2.0 SENSITIVE Sensitive     TRIMETH/SULFA <20 SENSITIVE Sensitive     CIPROFLOXACIN <0.25 SENSITIVE Sensitive     * SALMONELLA SPECIES  Culture, Urine     Status: None   Collection Time: 12/07/14  2:41 PM  Result Value Ref Range Status   Specimen Description URINE, RANDOM  Final   Special Requests NONE  Final   Culture  Setup Time   Final    12/08/2014 00:37 Performed at [place] Performed at [facility]   Final   Culture NO GROWTH Performed at [facility]   Final   Report Status 12/08/2014 FINAL  Final  Culture, blood (routine x 2)     Status: None (Preliminary result)   Collection Time: 12/09/14 11:43 AM  Result Value Ref Range Status   Specimen Description BLOOD LEFT ARM  Final   Special Requests BOTTLES DRAWN AEROBIC AND ANAEROBIC 10CC  Final   Culture   Final           BLOOD CULTURE RECEIVED NO GROWTH TO DATE CULTURE WILL BE HELD FOR 5 DAYS BEFORE ISSUING A FINAL NEGATIVE REPORT Performed at [facility]    Report Status PENDING  Incomplete  Culture, blood (routine x 2)     Status: None (Preliminary result)   Collection Time: 12/09/14 12:00 PM  Result Value Ref Range Status   Specimen Description BLOOD RIGHT ARM  Final   Special Requests BOTTLES DRAWN AEROBIC ONLY 5CC  Final   Culture   Final           BLOOD CULTURE RECEIVED NO GROWTH TO DATE CULTURE WILL BE HELD FOR 5 DAYS BEFORE ISSUING A FINAL NEGATIVE REPORT Performed at [facility]    Report Status PENDING  Incomplete  Clostridium Difficile by PCR     Status: None   Collection Time: 12/09/14  4:18 PM  Result Value Ref Range Status   C difficile by pcr NEGATIVE NEGATIVE  Final    Comment: Performed at [facility]    Studies/Results: No results found.    Assessment/Plan:  Principal Problem:   Intractable vomiting Active Problems:   Human immunodeficiency virus (HIV) disease   Microcytic anemia   Bipolar disorder  Esophageal ulcer   Esophageal stricture   Dehydration   Epigastric pain   HIV (human immunodeficiency virus infection)   Protein-calorie malnutrition, severe   Palliative care encounter   Loss of weight   Nausea and vomiting   Salmonella bacteremia   Noncompliance    [name] is a 38 y.o. female with  HIV, AIDS, noncompliance with Salmonella bacteremia after trip to the [place]  #1 Salmonella bacteremia:  --fu blood cultures are negative --Since pt lost IV access and organisms is FQ sensitive will change to oral cipro 500mg  bid --would give her this for 12 more days  #2 HIV/AIDS: continue GENVOYA in house, STRIBILD which she should have at home, continue azithromycin  1200mg  weekly, PCP prophylaxis is more problematic for her  #3 Hx of esophagitits: seems to be responding to fluconazole.   --can change to oral fluconazole 200mg  daily and finish total of 14 days She can followup with [place] in ID clinic after DC  Please call with further questions  I will sign off for now.    LOS: 6 days   Alcide Evener 12/11/2014, 4:10 PM

## 2014-12-11 NOTE — Progress Notes (Signed)
IV occluded. Per MD, OK to leave IV out.

## 2014-12-12 LAB — BASIC METABOLIC PANEL
ANION GAP: 2 — AB (ref 5–15)
BUN: 7 mg/dL (ref 6–23)
CHLORIDE: 103 meq/L (ref 96–112)
CO2: 28 mmol/L (ref 19–32)
Calcium: 8.5 mg/dL (ref 8.4–10.5)
Creatinine, Ser: 0.55 mg/dL (ref 0.50–1.10)
GFR calc non Af Amer: >90 mL/min (ref >90)
Glucose, Bld: 96 mg/dL (ref 70–99)
POTASSIUM: 4.3 mmol/L (ref 3.5–5.1)
SODIUM: 133 mmol/L — AB (ref 135–145)

## 2014-12-12 LAB — CBC
HCT: 26.7 % — ABNORMAL LOW (ref 36.0–46.0)
Hemoglobin: 8.4 g/dL — ABNORMAL LOW (ref 12.0–15.0)
MCH: 22.6 pg — ABNORMAL LOW (ref 26.0–34.0)
MCHC: 31.5 g/dL (ref 30.0–36.0)
MCV: 72 fL — ABNORMAL LOW (ref 78.0–100.0)
PLATELETS: 295 10*3/uL (ref 150–400)
RBC: 3.71 MIL/uL — ABNORMAL LOW (ref 3.87–5.11)
RDW: 19.6 % — AB (ref 11.5–15.5)
WBC: 5.1 10*3/uL (ref 4.0–10.5)

## 2014-12-12 MED ORDER — HYDROMORPHONE HCL 4 MG PO TABS: 4.0000 mg | ORAL_TABLET | ORAL | Status: DC | PRN

## 2014-12-12 MED ORDER — SUCRALFATE 1 GM/10ML PO SUSP: 1.0000 g | Freq: Three times a day (TID) | ORAL | Status: DC

## 2014-12-12 MED ORDER — ELVITEG-COBIC-EMTRICIT-TENOFAF 150-150-200-10 MG PO TABS: 1.0000 | ORAL_TABLET | Freq: Every day | ORAL | Status: DC

## 2014-12-12 MED ORDER — GI COCKTAIL ~~LOC~~: 30.0000 mL | Freq: Three times a day (TID) | ORAL | Status: DC

## 2014-12-12 MED ORDER — FLUCONAZOLE 100 MG PO TABS: 100.0000 mg | ORAL_TABLET | ORAL | Status: DC

## 2014-12-12 MED ORDER — PANTOPRAZOLE SODIUM 40 MG PO TBEC: 40.0000 mg | DELAYED_RELEASE_TABLET | Freq: Two times a day (BID) | ORAL | Status: DC

## 2014-12-12 MED ORDER — CIPROFLOXACIN HCL 500 MG PO TABS: 500.0000 mg | ORAL_TABLET | Freq: Two times a day (BID) | ORAL | Status: DC

## 2014-12-12 NOTE — Progress Notes (Signed)
Pt left at this time with her "friend". Alert, oriented, and without c/o. Discharge instructions/prescriptions given/explained with pt verbalizing understanding. Followup appointment noted.

## 2014-12-12 NOTE — Discharge Summary (Signed)
Physician Discharge Summary  [name] SPQ:[id number] DOB: June 16, 1976 DOA: 12/05/2014  PCP: Philis Fendt, MD  Admit date: 12/05/2014 Discharge date: 12/12/2014  Recommendations for Outpatient Follow-up:  1. Pt will need to follow up with PCP in 2-3 weeks post discharge 2. Please obtain BMP to evaluate electrolytes and kidney function 3. Please also check CBC to evaluate Hg and Hct levels 4. Cipro upon discharge for 12 more days   Discharge Diagnoses:  Principal Problem:   Intractable vomiting Active Problems:   Microcytic anemia   Human immunodeficiency virus (HIV) disease   Bipolar disorder   Esophageal ulcer   Esophageal stricture   Dehydration   Epigastric pain   HIV (human immunodeficiency virus infection)   Protein-calorie malnutrition, severe   Palliative care encounter   Loss of weight   Nausea and vomiting   Salmonella bacteremia   Noncompliance   Esophagitis   AIDS    Discharge Condition: Stable  Diet recommendation: Heart healthy diet discussed in details    Brief narrative: 38 y.o. female with history of esophageal ulcers, HIV and Hep B, last CD 4# in 08/2014 ~ 10, now presented to [facility] ED with main concern of several days duration of progressively worsening epigastric pain, associated with nausea and several episodes of non bloody vomiting, poor oral intake.  Assessment and Plan:    Principal Problem:  Intractable vomiting - this is rather chronic in nature and worse in the setting of poorly controlled HIV from medical non compliance  - CT abd and pelvis with ? Esophagitis (likely also HIV related) - no further intervention required per GI team, no need for repeat EGD - continue Protonix IV BID, GI cocktail as that has helped in the past - pt CD 4# 10 this admit  Active Problems:  Gram - bacteremia - blood cultures positive for Salmonella  - continued Rocephin day #3, appreciate ID assistance  - Zosyn started 12/23,  transitioned to Rocephin 12/25 - placed on contact precautions  - transitioned to Cipro upon discharge to complete therapy for 12 more days post discharge   Hypokalemia - supplemented and WNL this AM  Microcytic anemia - last iron level ~10, so also likely HIV related  - transfused one unit of PRBC 12/23 - Hg remains stable over the past 48 hours   Leukopenia - from HIV, WBC WNL this AM  Human immunodeficiency virus (HIV) disease - poorly controlled as noted above - compliance discussed again this AM - pt very clear she is willing to continue taking medication but her concern is still difficulty and pain with swallowing  - we have spent time discussing compliance and pt appreciated our concern, she understands how crucial it is to take that medicine  - she has two kids and understands she needs to get back on track, focus, take better care of herself   Bipolar disorder - appears stable at this time   Esophageal stricture - this is the primary cause for her dysphagia and also noted on EGD in 07/2014 - again primary focus is HIV treatment at this time  - Fluconazole IV for now given oral thrush   Protein-calorie malnutrition, severe - secondary to advanced HIV, progressive and uncontrolled - tolerating regular diet well   Right adnexae 3.5 cm cystic structure - possible fluid within the pelvis is related to rupture of ovarian cyst - no intervention indicated at this time, abd benign at this time  - repeat US in 4-6 weeks if needed  DVT prophylaxis  SCD's  Code Status: Full Family Communication: Pt at bedside Disposition Plan: Home    IV Access:    Peripheral IV Procedures and diagnostic studies:    Dg Chest 2 View 12/05/2014 No active cardiopulmonary disease.   Ct Abdomen Pelvis W Contrast 12/07/2014 Thickening of the distal esophagus, question esophagitis. Interval development of adenopathy upper abdomen (portacaval/ peripancreatic  region) of questionable etiology. It is possible this is related to underlying inflammatory process. Malignancy of indeterminate etiology is a secondary less likely consideration. Fluid within the pelvis. Within the fluid, there is a right adnexae 3.5 cm cystic structure. It is possible fluid within the pelvis is related to rupture of ovarian cyst. Right ovarian cystic structure can be evaluated with followup ultrasound. Although there is minimal haziness of fat planes surrounding portions of small bowel and colon, no discrete primary colonic or small bowel inflammatory process is noted.  Medical Consultants:    PCT Other Consultants:    None  Anti-Infectives:    Zosyn 12/23 --> 12/25  Rocephin 12/25 --> 12/28  Cipro 12/28 for 12 more days post discharge     Discharge Exam: Filed Vitals:   12/12/14 0544  BP: 84/49  Pulse: 92  Temp: 98.5 F (36.9 C)  Resp: 18   Filed Vitals:   12/11/14 0616 12/11/14 1621 12/11/14 2137 12/12/14 0544  BP: 131/85 97/66 90/58  84/49  Pulse: 77 113 93 92  Temp: 98.2 F (36.8 C) 98.5 F (36.9 C) 98.1 F (36.7 C) 98.5 F (36.9 C)  TempSrc: Oral Oral Oral Oral  Resp: 18 18 18 18   Height:      Weight:      SpO2: 100% 100% 100% 100%    General: Pt is alert, follows commands appropriately, not in acute distress Cardiovascular: Regular rate and rhythm, S1/S2 +, no murmurs, no rubs, no gallops Respiratory: Clear to auscultation bilaterally, no wheezing, no crackles, no rhonchi Abdominal: Soft, non tender, non distended, bowel sounds +, no guarding Extremities: no edema, no cyanosis, pulses palpable bilaterally DP and PT Neuro: Grossly nonfocal  Discharge Instructions  Discharge Instructions    Diet - low sodium heart healthy    Complete by:  As directed      Increase activity slowly    Complete by:  As directed             Medication List    STOP taking these medications        bisacodyl 5 MG EC tablet   Commonly known as:  DULCOLAX      TAKE these medications        ADULT GUMMY Chew  Chew 2 capsules by mouth daily.     albuterol 108 (90 BASE) MCG/ACT inhaler  Commonly known as:  PROVENTIL HFA;VENTOLIN HFA  Inhale 2 puffs into the lungs every 6 (six) hours as needed for wheezing.     azithromycin 200 MG/5ML suspension  Commonly known as:  ZITHROMAX  Take 1,200 mg by mouth once a week. (30 ml)     benztropine 1 MG tablet  Commonly known as:  COGENTIN  Take 1 tablet (1 mg total) by mouth daily.     ciprofloxacin 500 MG tablet  Commonly known as:  CIPRO  Take 1 tablet (500 mg total) by mouth 2 (two) times daily.     dapsone 100 MG tablet  Take 1 tablet (100 mg total) by mouth daily.     DEXILANT 60 MG capsule  Generic drug:  dexlansoprazole  Take 60 mg by mouth daily.     elvitegravir-cobicistat-emtricitabine-tenofovir 150-150-200-10 MG Tabs tablet  Commonly known as:  GENVOYA  Take 1 tablet by mouth daily with breakfast.     elvitegravir-cobicistat-emtricitabine-tenofovir 150-150-200-300 MG Tabs tablet  Commonly known as:  STRIBILD  Take 1 tablet by mouth daily with breakfast.     fluconazole 100 MG tablet  Commonly known as:  DIFLUCAN  Take 1 tablet (100 mg total) by mouth once a week.     gi cocktail Susp suspension  Take 30 mLs by mouth 3 (three) times daily as needed for indigestion. Shake well. Instruction: each 30 mL contains maalox 12 ml, viscous lidocaine 12 mL, donnatal 6 mL. Please call with questions [phone]        HYDROmorphone 4 MG tablet  Commonly known as:  DILAUDID  Take 1 tablet (4 mg total) by mouth every 4 (four) hours as needed for severe pain.     OLANZapine 5 MG tablet  Commonly known as:  ZYPREXA  Take 1 tablet (5 mg total) by mouth at bedtime.     ondansetron 8 MG tablet  Commonly known as:  ZOFRAN  Take 8 mg by mouth every 8 (eight) hours as needed for nausea or vomiting.     pantoprazole 40 MG tablet  Commonly known as:   PROTONIX  Take 1 tablet (40 mg total) by mouth 2 (two) times daily.     sucralfate 1 GM/10ML suspension  Commonly known as:  [name]  Take 10 mLs (1 g total) by mouth 4 (four) times daily -  with meals and at bedtime.     valACYclovir 1000 MG tablet  Commonly known as:  VALTREX  Take 1 tablet (1,000 mg total) by mouth 3 (three) times daily.            Follow-up Information    Follow up with Philis Fendt, MD.   Specialty:  Internal Medicine   Contact information:   [place] [address] [phone]       Follow up with Faye Ramsay, MD.   Specialty:  Internal Medicine   Why:  As needed, If symptoms worsen   Contact information:   [address] [place] [address] [phone]        The results of significant diagnostics from this hospitalization (including imaging, microbiology, ancillary and laboratory) are listed below for reference.     Microbiology: Recent Results (from the past 240 hour(s))  Culture, blood (routine x 2)     Status: None (Preliminary result)   Collection Time: 12/05/14 11:44 PM  Result Value Ref Range Status   Specimen Description BLOOD LEFT ANTECUBITAL  Final   Special Requests BOTTLES DRAWN AEROBIC AND ANAEROBIC 5ML  Final   Culture  Setup Time   Final    12/06/2014 04:48 Performed at [facility]    Culture   Final    SALMONELLA SPECIES Note: Gram Stain Report Called to,Read Back By and Verified With: BETTY HILL 12/07/14 AT 0015 RIDK CRITICAL RESULT CALLED TO, READ BACK BY AND VERIFIED WITH: ESTER EDGAL 12/09/14 @ 8:01PM BY RUSCOE A. Performed at [facility]    Report Status PENDING  Incomplete   Organism ID, Bacteria SALMONELLA SPECIES  Final      Susceptibility   Salmonella species - MIC*    AMPICILLIN <2.0 SENSITIVE Sensitive     TRIMETH/SULFA <20 SENSITIVE Sensitive     CIPROFLOXACIN <0.25 SENSITIVE Sensitive     *  SALMONELLA SPECIES  Culture, Urine      Status: None   Collection Time: 12/07/14  2:41 PM  Result Value Ref Range Status   Specimen Description URINE, RANDOM  Final   Special Requests NONE  Final   Culture  Setup Time   Final    12/08/2014 00:37 Performed at [place] Performed at [facility]   Final   Culture NO GROWTH Performed at [facility]   Final   Report Status 12/08/2014 FINAL  Final  Culture, blood (routine x 2)     Status: None (Preliminary result)   Collection Time: 12/09/14 11:43 AM  Result Value Ref Range Status   Specimen Description BLOOD LEFT ARM  Final   Special Requests BOTTLES DRAWN AEROBIC AND ANAEROBIC 10CC  Final   Culture   Final           BLOOD CULTURE RECEIVED NO GROWTH TO DATE CULTURE WILL BE HELD FOR 5 DAYS BEFORE ISSUING A FINAL NEGATIVE REPORT Note: Culture results may be compromised due to an excessive volume of blood received in culture bottles. Performed at [facility]    Report Status PENDING  Incomplete  Culture, blood (routine x 2)     Status: None (Preliminary result)   Collection Time: 12/09/14 12:00 PM  Result Value Ref Range Status   Specimen Description BLOOD RIGHT ARM  Final   Special Requests BOTTLES DRAWN AEROBIC ONLY 5CC  Final   Culture   Final           BLOOD CULTURE RECEIVED NO GROWTH TO DATE CULTURE WILL BE HELD FOR 5 DAYS BEFORE ISSUING A FINAL NEGATIVE REPORT Performed at [facility]    Report Status PENDING  Incomplete  Clostridium Difficile by PCR     Status: None   Collection Time: 12/09/14  4:18 PM  Result Value Ref Range Status   C difficile by pcr NEGATIVE NEGATIVE Final    Comment: Performed at [place]: Basic Metabolic Panel:  Recent Labs Lab 12/08/14 0403 12/09/14 0430 12/10/14 0531 12/11/14 0506 12/12/14 0520  NA 139 136 135 133* 133*  K 2.8* 4.1 4.2 5.0 4.3  CL 111 111 108 105 103  CO2 24 22 23 26 28   GLUCOSE 92 105* 96 110* 96  BUN <5* <5* <5* 6  7  CREATININE 0.51 0.58 0.54 0.61 0.55  CALCIUM 7.8* 7.6* 8.2* 8.6 8.5  MG 1.6  --   --   --   --    Liver Function Tests:  Recent Labs Lab 12/06/14 0435  AST 16  15  ALT 9  9  ALKPHOS 70  68  BILITOT 0.5  0.5  PROT 6.2  6.4  ALBUMIN 3.1*  3.1*    Recent Labs Lab 12/06/14 0435  LIPASE 40   No results for input(s): AMMONIA in the last 168 hours. CBC:  Recent Labs Lab 12/05/14 2349  12/08/14 0403 12/09/14 0430 12/10/14 0531 12/11/14 0506 12/12/14 0520  WBC 3.9*  < > 3.7* 3.0* 4.5 4.1 5.1  NEUTROABS 3.3  --   --   --   --   --   --   HGB 7.7*  < > 7.4* 7.8* 8.1* 8.4* 8.4*  HCT 24.8*  < > 22.3* 24.6* 24.3* 26.6* 26.7*  MCV 70.9*  < > 71.7* 72.6* 71.5* 71.9* 72.0*  PLT 265  < > 211 252 234 279  295  < > = values in this interval not displayed. Cardiac Enzymes:  Recent Labs Lab 12/06/14 0435  TROPONINI <0.03   SIGNED: Time coordinating discharge: Over 30 minutes  Faye Ramsay, MD  [facility] 12/12/2014, 9:39 AM Pager [phone]  If 7PM-7AM, please contact night-coverage [email or web address] Password [email or web address]

## 2014-12-12 NOTE — Discharge Instructions (Signed)

## 2014-12-13 LAB — GI PATHOGEN PANEL BY PCR, STOOL
C difficile toxin A/B: NEGATIVE
CRYPTOSPORIDIUM BY PCR: NEGATIVE
Campylobacter by PCR: NEGATIVE
E coli (ETEC) LT/ST: NEGATIVE
E coli (STEC): NEGATIVE
E coli 0157 by PCR: NEGATIVE
G lamblia by PCR: NEGATIVE
NOROVIRUS G1/G2: NEGATIVE
Rotavirus A by PCR: NEGATIVE
SALMONELLA BY PCR: NEGATIVE
SHIGELLA BY PCR: NEGATIVE

## 2014-12-13 LAB — OVA AND PARASITE EXAMINATION

## 2014-12-13 MED ORDER — CIPROFLOXACIN HCL 500 MG PO TABS: 500.0000 mg | ORAL_TABLET | Freq: Two times a day (BID) | ORAL | Status: DC

## 2014-12-15 LAB — CULTURE, BLOOD (ROUTINE X 2)
CULTURE: NO GROWTH
CULTURE: NO GROWTH

## 2014-12-18 LAB — STOOL CULTURE

## 2014-12-20 LAB — CULTURE, BLOOD (ROUTINE X 2): CULTURE: NO GROWTH

## 2014-12-27 ENCOUNTER — Encounter: Payer: Self-pay | Admitting: Internal Medicine

## 2014-12-27 ENCOUNTER — Ambulatory Visit (INDEPENDENT_AMBULATORY_CARE_PROVIDER_SITE_OTHER): Payer: Medicaid Other | Admitting: Internal Medicine

## 2014-12-27 DIAGNOSIS — B20 Human immunodeficiency virus [HIV] disease: Secondary | ICD-10-CM

## 2014-12-27 NOTE — Progress Notes (Signed)
Patient ID: [name], female   DOB: Jun 18, 1976, 39 y.o.   MRN: [id number]          Patient Active Problem List   Diagnosis Date Noted  . Abdominal pain, acute 08/10/2014    Priority: High  . Unintentional weight loss 07/10/2013    Priority: High  . Esophageal ulcer 07/10/2013    Priority: High  . Bipolar disorder 04/01/2012    Priority: High  . Human immunodeficiency virus (HIV) disease 02/28/2009    Priority: High  . Microcytic anemia 02/27/2009    Priority: High  . Esophagitis   . AIDS   . Salmonella bacteremia   . Noncompliance   . Nausea and vomiting   . Palliative care encounter   . Loss of weight   . Esophageal stricture 12/06/2014  . Dehydration 12/06/2014  . Protein-calorie malnutrition, severe 12/06/2014  . Epigastric pain   . HIV (human immunodeficiency virus infection)   . Intractable vomiting 12/05/2014  . Constipation 10/04/2014  . CIN I (cervical intraepithelial neoplasia I) 01/11/2014  . 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 Elevated lipase 07/10/2013  . Elevated alkaline phosphatase level 07/10/2013  . GENITAL HERPES 02/28/2009    Patient's Medications  New Prescriptions   No medications on file  Previous Medications   ALBUTEROL (PROVENTIL HFA;VENTOLIN HFA) 108 (90 BASE) MCG/ACT INHALER    Inhale 2 puffs into the lungs every 6 (six) hours as needed for wheezing.   ALUM & MAG HYDROXIDE-SIMETH (GI COCKTAIL) SUSP SUSPENSION    Take 30 mLs by mouth 4 (four) times daily - after meals and at bedtime. Each 30 mL contains Maalox 12 mL, Viscous lidocaine 12 mL, donnatal 6 mL   AZITHROMYCIN (ZITHROMAX) 200 MG/5ML SUSPENSION    Take 1,200 mg by mouth once a week. (30 ml)   BENZTROPINE (COGENTIN) 1 MG TABLET    Take 1 tablet (1 mg total) by mouth daily.   ELVITEGRAVIR-COBICISTAT-EMTRICITABINE-TENOFOVIR (GENVOYA) 150-150-200-10 MG TABS TABLET    Take 1 tablet by mouth daily with breakfast.   FLUCONAZOLE (DIFLUCAN) 100 MG TABLET    Take 1 tablet  (100 mg total) by mouth once a week.   HYDROMORPHONE (DILAUDID) 4 MG TABLET    Take 1 tablet (4 mg total) by mouth every 4 (four) hours as needed for severe pain.   MULTIPLE VITAMINS-MINERALS (ADULT GUMMY) CHEW    Chew 2 capsules by mouth daily.   OLANZAPINE (ZYPREXA) 5 MG TABLET    Take 1 tablet (5 mg total) by mouth at bedtime.   ONDANSETRON (ZOFRAN) 8 MG TABLET    Take 8 mg by mouth every 8 (eight) hours as needed for nausea or vomiting.   PANTOPRAZOLE (PROTONIX) 40 MG TABLET    Take 1 tablet (40 mg total) by mouth 2 (two) times daily.   SUCRALFATE ([name]) 1 GM/10ML SUSPENSION    Take 10 mLs (1 g total) by mouth 4 (four) times daily -  with meals and at bedtime.   VALACYCLOVIR (VALTREX) 1000 MG TABLET    Take 1 tablet (1,000 mg total) by mouth 3 (three) times daily.  Modified Medications   No medications on file  Discontinued Medications   ALUM & MAG HYDROXIDE-SIMETH (GI COCKTAIL) SUSP SUSPENSION    Take 30 mLs by mouth 3 (three) times daily as needed for indigestion. Shake well. Instruction: each 30 mL contains maalox 12 ml, viscous lidocaine 12 mL, donnatal 6 mL. Please call with questions [phone]   CIPROFLOXACIN (CIPRO) 500 MG TABLET  Take 1 tablet (500 mg total) by mouth 2 (two) times daily.   DAPSONE 100 MG TABLET    Take 1 tablet (100 mg total) by mouth daily.   DEXLANSOPRAZOLE (DEXILANT) 60 MG CAPSULE    Take 60 mg by mouth daily.    ELVITEGRAVIR-COBICISTAT-EMTRICITABINE-TENOFOVIR (STRIBILD) 150-150-200-300 MG TABS TABLET    Take 1 tablet by mouth daily with breakfast.    Subjective: [name] is in for her hospital follow-up visit. She developed problems swallowing again along with nausea and vomiting and had stopped taking all of her medications. She was admitted to the hospital with esophagitis and Salmonella bacteremia. She has just completed therapy with ciprofloxacin. She restarted antiretroviral therapy with Genvoya and takes it every morning. She's not having any trouble  swallowing her pills. She has not missed any doses. She states that she is not really feeling depressed but she is anxious. She states that she has a tendency to take on other people's problems and she does not like being around a lot of other people. She tends to stay in the house and not go out.  Review of Systems: Constitutional: negative Eyes: negative Ears, nose, mouth, throat, and face: negative Respiratory: negative Cardiovascular: negative Gastrointestinal: negative Genitourinary:negative  Past Medical History  Diagnosis Date  . HIV positive   . Hepatitis B     /E-chart  . Microcytic anemia     h/o per E-chart  . 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 Noncompliance with medication regimen     /e-chart  . 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 Pyelonephritis     h/o per E-chart  . Thyroid disease   . Depression   . Asthma     inhaler 2xday    History  Substance Use Topics  . Smoking status: Never Smoker   . Smokeless tobacco: Never Used  . Alcohol Use: 0.0 oz/week    0 Not specified per week     Comment: occ    Family History  Problem Relation Age of Onset  . Cancer Mother   . Diabetes Mother   . Diabetes Father   . Heart disease Father   . Cancer Sister   . Diabetes Sister   . Asthma Son     Allergies  Allergen Reactions  . Bactrim Itching  . Orange Fruit Itching  . Peanut-Containing Drug Products Hives  . Shellfish Allergy Hives  . Sulfa Antibiotics Itching  . Dapsone Itching and Rash    Objective: Temp: 98.4 F (36.9 C) (01/12 1118) Temp Source: Oral (01/12 1118) BP: 125/84 mmHg (01/12 1118) Pulse Rate: 77 (01/12 1118) Body mass index is 20.61 kg/(m^2).  General: She is smiling and in good spirits but does become tearful during portions of the exam Oral: No thrush or other oropharyngeal lesions Skin: No rash Lungs: Clear Cor: Regular S1 and S2 with no  murmurs Abdomen: Soft and nontender Mood: She does not appear anxious or depressed but does become tearful when talking about her problems  Lab Results Lab Results  Component Value Date   WBC 5.1 12/12/2014   HGB 8.4* 12/12/2014   HCT 26.7* 12/12/2014   MCV 72.0* 12/12/2014   PLT 295 12/12/2014    Lab Results  Component Value Date   CREATININE 0.55 12/12/2014   BUN 7 12/12/2014   NA 133* 12/12/2014   K 4.3 12/12/2014   CL 103 12/12/2014   CO2 28  12/12/2014    Lab Results  Component Value Date   ALT 9 12/06/2014   ALT 9 12/06/2014   AST 16 12/06/2014   AST 15 12/06/2014   ALKPHOS 70 12/06/2014   ALKPHOS 68 12/06/2014   BILITOT 0.5 12/06/2014   BILITOT 0.5 12/06/2014    Lab Results  Component Value Date   CHOL 155 01/03/2014   HDL 33* 01/03/2014   LDLCALC 58 01/03/2014   TRIG 318* 01/03/2014   CHOLHDL 4.7 01/03/2014    Lab Results HIV 1 RNA QUANT (copies/mL)  Date Value  12/05/2014 433295*  10/04/2014 188416*  08/16/2014 95482*   CD4 T CELL ABS (/uL)  Date Value  10/04/2014 <10*  08/16/2014 <10*  08/11/2014 10*     Assessment: She has recovered from her Salmonella bacteremia and most recent bout of esophagitis. She is doing well back on medications. I talked to her at length about how she is cycled on and off medications over the past several years. We will need to work hard on her depression and anxiety. She is scheduled to see our counselor, Curley Spice, soon. I encouraged her to call us at any time if she is having any new difficulties.  Plan: 1. Continue Genvoya and other current medications 2. Mental health counseling 3. Follow-up after lab work in [place] weeks   Michel Bickers, MD [facility] [place] [phone] pager   [phone] cell 12/27/2014, 12:05 PM

## 2015-01-05 ENCOUNTER — Ambulatory Visit: Payer: Medicaid Other

## 2015-01-06 ENCOUNTER — Other Ambulatory Visit: Payer: Self-pay | Admitting: Internal Medicine

## 2015-01-13 ENCOUNTER — Other Ambulatory Visit: Payer: Self-pay | Admitting: Licensed Clinical Social Worker

## 2015-01-13 LAB — CULTURE, BLOOD (ROUTINE X 2)

## 2015-01-13 MED ORDER — ELVITEG-COBIC-EMTRICIT-TENOFAF 150-150-200-10 MG PO TABS: 1.0000 | ORAL_TABLET | Freq: Every day | ORAL | Status: DC

## 2015-01-18 ENCOUNTER — Ambulatory Visit: Payer: Medicaid Other

## 2015-01-24 ENCOUNTER — Telehealth: Payer: Self-pay | Admitting: *Deleted

## 2015-01-24 NOTE — Telephone Encounter (Signed)
Patient called and advised she is having trouble swallowing her medications. She advised she has been back to the ED for this again as she has been vomiting when she swallows. The patient states that even when she eats food it gets stuck in her throat and that it takes almost a day to clear. She is worried that she will get very sick if she does not get some help with this issue. She advised she has not taken her medication since 01/21/15 and wants the doctor to refer her to ENT. Advised her the trouble she describes would be seen by GI and that I will send the doctor a message and get back to her.

## 2015-01-24 NOTE — Telephone Encounter (Signed)
Please double book her to see me tomorrow afternoon.

## 2015-01-25 NOTE — Telephone Encounter (Signed)
Patient coming 01/26/15 to see the doctor.

## 2015-01-26 ENCOUNTER — Ambulatory Visit: Payer: Medicaid Other | Admitting: Internal Medicine

## 2015-01-30 ENCOUNTER — Ambulatory Visit: Payer: Medicaid Other | Admitting: Internal Medicine

## 2015-01-31 ENCOUNTER — Other Ambulatory Visit: Payer: Medicaid Other

## 2015-01-31 DIAGNOSIS — Z79899 Other long term (current) drug therapy: Secondary | ICD-10-CM

## 2015-01-31 DIAGNOSIS — Z113 Encounter for screening for infections with a predominantly sexual mode of transmission: Secondary | ICD-10-CM

## 2015-01-31 DIAGNOSIS — B20 Human immunodeficiency virus [HIV] disease: Secondary | ICD-10-CM

## 2015-01-31 LAB — CBC
HCT: 31.5 % — ABNORMAL LOW (ref 36.0–46.0)
Hemoglobin: 10 g/dL — ABNORMAL LOW (ref 12.0–15.0)
MCH: 22.5 pg — AB (ref 26.0–34.0)
MCHC: 31.7 g/dL (ref 30.0–36.0)
MCV: 70.9 fL — ABNORMAL LOW (ref 78.0–100.0)
MPV: 8.8 fL (ref 8.6–12.4)
Platelets: 311 10*3/uL (ref 150–400)
RBC: 4.44 MIL/uL (ref 3.87–5.11)
RDW: 20.2 % — ABNORMAL HIGH (ref 11.5–15.5)
WBC: 3.9 10*3/uL — ABNORMAL LOW (ref 4.0–10.5)

## 2015-01-31 LAB — LIPID PANEL
Cholesterol: 184 mg/dL (ref 0–200)
HDL: 68 mg/dL (ref >39)
LDL CALC: 85 mg/dL (ref 0–99)
Total CHOL/HDL Ratio: 2.7 Ratio
Triglycerides: 156 mg/dL — ABNORMAL HIGH (ref <150)
VLDL: 31 mg/dL (ref 0–40)

## 2015-01-31 LAB — COMPREHENSIVE METABOLIC PANEL
ALBUMIN: 3.7 g/dL (ref 3.5–5.2)
ALT: 8 U/L (ref 0–35)
AST: 15 U/L (ref 0–37)
Alkaline Phosphatase: 78 U/L (ref 39–117)
BILIRUBIN TOTAL: 0.3 mg/dL (ref 0.2–1.2)
BUN: 6 mg/dL (ref 6–23)
CO2: 25 meq/L (ref 19–32)
Calcium: 9.1 mg/dL (ref 8.4–10.5)
Chloride: 102 mEq/L (ref 96–112)
Creat: 0.57 mg/dL (ref 0.50–1.10)
GLUCOSE: 80 mg/dL (ref 70–99)
POTASSIUM: 3.9 meq/L (ref 3.5–5.3)
Sodium: 135 mEq/L (ref 135–145)
TOTAL PROTEIN: 6.7 g/dL (ref 6.0–8.3)

## 2015-02-01 LAB — RPR

## 2015-02-01 LAB — T-HELPER CELL (CD4) - (RCID CLINIC ONLY)
CD4 % Helper T Cell: 2 % — ABNORMAL LOW (ref 33–55)
CD4 T CELL ABS: 20 /uL — AB (ref 400–2700)

## 2015-02-01 LAB — HIV-1 RNA QUANT-NO REFLEX-BLD
HIV 1 RNA Quant: 35728 copies/mL — ABNORMAL HIGH (ref <20)
HIV-1 RNA Quant, Log: 4.55 {Log} — ABNORMAL HIGH (ref <1.30)

## 2015-02-13 ENCOUNTER — Other Ambulatory Visit: Payer: Self-pay | Admitting: Gastroenterology

## 2015-02-13 NOTE — Addendum Note (Signed)
Addended by: Wilford Corner on: 02/13/2015 02:28 PM   Modules accepted: Orders

## 2015-02-14 ENCOUNTER — Ambulatory Visit (HOSPITAL_COMMUNITY)
Admission: RE | Admit: 2015-02-14 | Discharge: 2015-02-14 | Disposition: A | Discharge status: Home / Self Care | Payer: Medicaid Other | Source: Ambulatory Visit | Attending: Gastroenterology | Admitting: Gastroenterology

## 2015-02-14 ENCOUNTER — Ambulatory Visit: Payer: Medicaid Other | Admitting: Internal Medicine

## 2015-02-14 ENCOUNTER — Encounter (HOSPITAL_COMMUNITY)
Admission: RE | Disposition: A | Discharge status: Home / Self Care | Payer: Self-pay | Source: Ambulatory Visit | Attending: Gastroenterology

## 2015-02-14 ENCOUNTER — Ambulatory Visit (HOSPITAL_COMMUNITY): Payer: Medicaid Other

## 2015-02-14 ENCOUNTER — Ambulatory Visit (HOSPITAL_COMMUNITY): Payer: Medicaid Other | Admitting: Anesthesiology

## 2015-02-14 ENCOUNTER — Encounter (HOSPITAL_COMMUNITY): Payer: Self-pay | Admitting: *Deleted

## 2015-02-14 DIAGNOSIS — R1310 Dysphagia, unspecified: Secondary | ICD-10-CM | POA: Diagnosis present

## 2015-02-14 DIAGNOSIS — K449 Diaphragmatic hernia without obstruction or gangrene: Secondary | ICD-10-CM | POA: Diagnosis not present

## 2015-02-14 DIAGNOSIS — K222 Esophageal obstruction: Secondary | ICD-10-CM | POA: Insufficient documentation

## 2015-02-14 DIAGNOSIS — F329 Major depressive disorder, single episode, unspecified: Secondary | ICD-10-CM | POA: Diagnosis not present

## 2015-02-14 DIAGNOSIS — J45909 Unspecified asthma, uncomplicated: Secondary | ICD-10-CM | POA: Insufficient documentation

## 2015-02-14 DIAGNOSIS — B3781 Candidal esophagitis: Secondary | ICD-10-CM | POA: Diagnosis not present

## 2015-02-14 HISTORY — PX: BALLOON DILATION: SHX5330

## 2015-02-14 HISTORY — PX: SAVORY DILATION: SHX5439

## 2015-02-14 HISTORY — PX: ESOPHAGOGASTRODUODENOSCOPY: SHX5428

## 2015-02-14 SURGERY — EGD (ESOPHAGOGASTRODUODENOSCOPY)
Anesthesia: Monitor Anesthesia Care

## 2015-02-14 MED ORDER — DIPHENHYDRAMINE HCL 50 MG/ML IJ SOLN
INTRAMUSCULAR | Status: DC | PRN
  Administered 2015-02-14: 25 mg via INTRAVENOUS

## 2015-02-14 MED ORDER — LACTATED RINGERS IV SOLN
INTRAVENOUS | Status: DC
  Administered 2015-02-14: 1000 mL via INTRAVENOUS

## 2015-02-14 MED ORDER — BUTAMBEN-TETRACAINE-BENZOCAINE 2-2-14 % EX AERO
INHALATION_SPRAY | CUTANEOUS | Status: DC | PRN
  Administered 2015-02-14: 2 via TOPICAL

## 2015-02-14 MED ORDER — FLUCONAZOLE 100 MG PO TABS: 100.0000 mg | ORAL_TABLET | Freq: Every day | ORAL | Status: DC

## 2015-02-14 MED ORDER — PROPOFOL INFUSION 10 MG/ML OPTIME
INTRAVENOUS | Status: DC | PRN
  Administered 2015-02-14: 75 ug/kg/min via INTRAVENOUS

## 2015-02-14 MED ORDER — PROPOFOL 10 MG/ML IV BOLUS
INTRAVENOUS | Status: DC | PRN
  Administered 2015-02-14 (×2): 10 mg via INTRAVENOUS
  Administered 2015-02-14 (×3): 20 mg via INTRAVENOUS
  Administered 2015-02-14 (×2): 10 mg via INTRAVENOUS
  Administered 2015-02-14 (×2): 20 mg via INTRAVENOUS

## 2015-02-14 MED ORDER — SODIUM CHLORIDE 0.9 % IV SOLN: INTRAVENOUS | Status: DC

## 2015-02-14 MED ORDER — MIDAZOLAM HCL 2 MG/2ML IJ SOLN
INTRAMUSCULAR | Status: DC | PRN
  Administered 2015-02-14 (×2): 1 mg via INTRAVENOUS

## 2015-02-14 NOTE — Transfer of Care (Signed)
Immediate Anesthesia Transfer of Care Note  Patient: [name]  Procedure(s) Performed: Procedure(s) with comments: ESOPHAGOGASTRODUODENOSCOPY (EGD) (N/A) BALLOON DILATION (N/A) SAVORY DILATION (N/A) - no xray needed  Patient Location: Endoscopy Unit  Anesthesia Type:MAC  Level of Consciousness: awake and alert   Airway & Oxygen Therapy: Patient Spontanous Breathing and Patient connected to nasal cannula oxygen  Post-op Assessment: Report given to RN and Post -op Vital signs reviewed and stable  Post vital signs: Reviewed and stable  Last Vitals:  Filed Vitals:   02/14/15 1050  BP: 146/86  Pulse: 62  Temp: 36.7 C  Resp: 16    Complications: No apparent anesthesia complications

## 2015-02-14 NOTE — Discharge Instructions (Signed)
Clear liquid diet only today and if tolerating then starting tomorrow advance to soft solids as tolerated.

## 2015-02-14 NOTE — H&P (Signed)
  Date of Initial H&P: 02/07/15  History reviewed, patient examined, no change in status, stable for surgery.

## 2015-02-14 NOTE — Anesthesia Preprocedure Evaluation (Addendum)
Anesthesia Evaluation  Patient identified by MRN, date of birth, ID band Patient awake    Reviewed: Allergy & Precautions, NPO status , Patient's Chart, lab work & pertinent test results  Airway Mallampati: II   Neck ROM: Full    Dental  (+) Teeth Intact, Dental Advisory Given   Pulmonary asthma ,  breath sounds clear to auscultation        Cardiovascular negative cardio ROS  Rhythm:Regular     Neuro/Psych Depression Bipolar Disorder Schizophrenia    GI/Hepatic PUD, (+) Hepatitis -, BEsophageal stricture   Endo/Other    Renal/GU      Musculoskeletal   Abdominal (+)  Abdomen: soft.    Peds  Hematology  (+) HIV, 11/30   Anesthesia Other Findings   Reproductive/Obstetrics                           Anesthesia Physical Anesthesia Plan  ASA: II  Anesthesia Plan: MAC   Post-op Pain Management:    Induction: Intravenous  Airway Management Planned: Nasal Cannula  Additional Equipment:   Intra-op Plan:   Post-operative Plan:   Informed Consent: I have reviewed the patients History and Physical, chart, labs and discussed the procedure including the risks, benefits and alternatives for the proposed anesthesia with the patient or authorized representative who has indicated his/her understanding and acceptance.     Plan Discussed with:   Anesthesia Plan Comments:         Anesthesia Quick Evaluation

## 2015-02-14 NOTE — Anesthesia Postprocedure Evaluation (Signed)
  Anesthesia Post-op Note  Patient: [name]  Procedure(s) Performed: Procedure(s) with comments: ESOPHAGOGASTRODUODENOSCOPY (EGD) (N/A) BALLOON DILATION (N/A) SAVORY DILATION (N/A) - no xray needed  Patient Location: PACU  Anesthesia Type:MAC  Level of Consciousness: awake  Airway and Oxygen Therapy: Patient Spontanous Breathing and Patient connected to nasal cannula oxygen  Post-op Pain: none  Post-op Assessment: Post-op Vital signs reviewed, Patient's Cardiovascular Status Stable, Respiratory Function Stable and Patent Airway  Post-op Vital Signs: Reviewed and stable  Last Vitals:  Filed Vitals:   02/14/15 1050  BP: 146/86  Pulse: 62  Temp: 36.7 C  Resp: 16    Complications: No apparent anesthesia complications

## 2015-02-14 NOTE — Op Note (Signed)
[facility] [place], [address]   ENDOSCOPY PROCEDURE REPORT  PATIENT: [name], [name]  MR#: [id number] BIRTHDATE: December 24, 1975 , 38  yrs. old GENDER: female ENDOSCOPIST: Wilford Corner, MD REFERRED BY: PROCEDURE DATE:  2015/02/18 PROCEDURE:  EGD w/ wire guided (savary) dilation ASA CLASS:     Class III INDICATIONS:  dysphagia. MEDICATIONS: Monitored anesthesia care TOPICAL ANESTHETIC:  DESCRIPTION OF PROCEDURE: After the risks benefits and alternatives of the procedure were thoroughly explained, informed consent was obtained.  The    endoscope was introduced through the mouth and advanced to the second portion of the duodenum , Without limitations.  The instrument was slowly withdrawn as the mucosa was fully examined.    Moderate Candida esophagitis in the proximal and mid-esophagus - s/p cytologic brushing. A nodule was noted above the stricture. A tight esophageal stricture noted starting at 32 cm from the incisors. Standard endoscope could not traverse the stricture so a pediatric endoscope (8 mm) was used and that could not completely traverse the stricture although it partially traversed the stricture. A spring-tipped guidewire was then used to traverse the stricture with use of fluoroscopy. In sequential fashion a 9 mm (minimal resistance, minimal heme), 10 mm (min resistance, +heme) , 11 mm (mild resistance, + heme), and 12.8 mm (moderate resistance, +heme) dilator were each advanced over the wire using fluoroscopy. The pediatric endoscope was reinserted and the stricture was traverse without resistance. The stricture extended from 32 cm - 35 cm from the incisors. The GEJ was noted to be at 45 cm from the incisors. Stomach normal except for a small hiatal hernia noted on retroflexion. Duodenal bulb and 2nd portion of the duodenum were normal.            The scope was then withdrawn from the patient and the procedure  completed.  COMPLICATIONS: There were no immediate complications.  ENDOSCOPIC IMPRESSION:     Tight mid-esophageal stricture - s/p dilation to 12.8 mm (see above) Candida Esophagitis Hiatal hernia  RECOMMENDATIONS:     Fluconazole 100 mg/day; Liquid diet and advance as tolerated tomorrow starting with soft solids F/U on cytology   eSigned:  Wilford Corner, MD 02-18-2015 11:24 AM    CC:  CPT CODES: ICD CODES:  The ICD and CPT codes recommended by this software are interpretations from the data that the clinical staff has captured with the software.  The verification of the translation of this report to the ICD and CPT codes and modifiers is the sole responsibility of the health care institution and practicing physician where this report was generated.  [employer]. will not be held responsible for the validity of the ICD and CPT codes included on this report.  AMA assumes no liability for data contained or not contained herein. CPT is a Designer, television/film set of the [employer].  PATIENT NAME:  [name], [name] MR#: [id number]

## 2015-02-14 NOTE — Interval H&P Note (Signed)
History and Physical Interval Note:  02/14/2015 9:29 AM  [name]  has presented today for surgery, with the diagnosis of dysphagia  The various methods of treatment have been discussed with the patient and family. After consideration of risks, benefits and other options for treatment, the patient has consented to  Procedure(s) with comments: ESOPHAGOGASTRODUODENOSCOPY (EGD) (N/A) BALLOON DILATION (N/A) SAVORY DILATION (N/A) - no xray needed as a surgical intervention .  The patient's history has been reviewed, patient examined, no change in status, stable for surgery.  I have reviewed the patient's chart and labs.  Questions were answered to the patient's satisfaction.     Linden C.

## 2015-02-15 ENCOUNTER — Encounter (HOSPITAL_COMMUNITY): Payer: Self-pay | Admitting: Gastroenterology

## 2015-02-20 ENCOUNTER — Ambulatory Visit (INDEPENDENT_AMBULATORY_CARE_PROVIDER_SITE_OTHER): Payer: Medicaid Other | Admitting: Internal Medicine

## 2015-02-20 DIAGNOSIS — B20 Human immunodeficiency virus [HIV] disease: Secondary | ICD-10-CM

## 2015-02-20 LAB — MISCELLANEOUS TEST

## 2015-02-20 MED ORDER — ATOVAQUONE 750 MG/5ML PO SUSP: 1500.0000 mg | Freq: Every day | ORAL | Status: DC

## 2015-02-20 NOTE — Progress Notes (Signed)
Patient ID: [name], female   DOB: 1976-03-31, 39 y.o.   MRN: [id number]          Patient Active Problem List   Diagnosis Date Noted  . Abdominal pain, acute 08/10/2014    Priority: High  . Unintentional weight loss 07/10/2013    Priority: High  . Esophageal ulcer 07/10/2013    Priority: High  . Bipolar disorder 04/01/2012    Priority: High  . Human immunodeficiency virus (HIV) disease 02/28/2009    Priority: High  . Microcytic anemia 02/27/2009    Priority: High  . Dysphagia 02/14/2015  . Esophagitis   . AIDS   . Salmonella bacteremia   . Noncompliance   . Nausea and vomiting   . Palliative care encounter   . Loss of weight   . Esophageal stricture 12/06/2014  . Dehydration 12/06/2014  . Protein-calorie malnutrition, severe 12/06/2014  . Epigastric pain   . HIV (human immunodeficiency virus infection)   . Intractable vomiting 12/05/2014  . Constipation 10/04/2014  . CIN I (cervical intraepithelial neoplasia I) 01/11/2014  . 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 Elevated lipase 07/10/2013  . Elevated alkaline phosphatase level 07/10/2013  . GENITAL HERPES 02/28/2009    Patient's Medications  New Prescriptions   ATOVAQUONE (MEPRON) 750 MG/5ML SUSPENSION    Take 10 mLs (1,500 mg total) by mouth daily.  Previous Medications   ALBUTEROL (PROVENTIL HFA;VENTOLIN HFA) 108 (90 BASE) MCG/ACT INHALER    Inhale 2 puffs into the lungs every 6 (six) hours as needed for wheezing.   AZITHROMYCIN (ZITHROMAX) 200 MG/5ML SUSPENSION    Take 1,200 mg by mouth once a week. (30 ml)   BENZTROPINE (COGENTIN) 1 MG TABLET    Take 1 tablet (1 mg total) by mouth daily.   ELVITEGRAVIR-COBICISTAT-EMTRICITABINE-TENOFOVIR (GENVOYA) 150-150-200-10 MG TABS TABLET    Take 1 tablet by mouth daily with breakfast.   FLUCONAZOLE (DIFLUCAN) 100 MG TABLET    Take 1 tablet (100 mg total) by mouth daily.   MULTIPLE VITAMINS-MINERALS (ADULT GUMMY) CHEW    Chew 2 capsules by mouth daily.   OLANZAPINE (ZYPREXA) 5 MG TABLET    Take 1 tablet (5 mg total) by mouth at bedtime.   ONDANSETRON (ZOFRAN) 8 MG TABLET    Take 8 mg by mouth every 8 (eight) hours as needed for nausea or vomiting.   PANTOPRAZOLE (PROTONIX) 40 MG TABLET    Take 1 tablet (40 mg total) by mouth 2 (two) times daily.   VALACYCLOVIR (VALTREX) 1000 MG TABLET    Take 1 tablet (1,000 mg total) by mouth 3 (three) times daily.  Modified Medications   No medications on file  Discontinued Medications   HYDROMORPHONE (DILAUDID) 4 MG TABLET    Take 1 tablet (4 mg total) by mouth every 4 (four) hours as needed for severe pain.    Subjective: Necha is in for her routine HIV follow-up visit. She recently began having problems with dysphasia with solid food and pills again. She also started having a dying aphasia. It became so bad she could not take any of her medications and was having difficulty eating. She underwent upper endoscopy 1 week ago and was found to have a tight esophageal stricture that was dilated. She was also found to have recurrent candidal esophagitis. She was started on daily fluconazole and has improved. She has been back on her Genvoya, Protonix, Cogentin and Zyprexa for the past week without any misses. She is not taking her azithromycin or  Mepron. She has had trouble tolerating all pneumocystis prophylaxis in the past. Mepron has caused some nausea in the past. She wants to know if she could take it with anti-emetics at bedtime. Review of Systems: Constitutional: negative Eyes: negative Ears, nose, mouth, throat, and face: negative Respiratory: negative Cardiovascular: negative Gastrointestinal: negative Genitourinary:negative  Past Medical History  Diagnosis Date  . HIV positive   . Hepatitis B     /E-chart  . Microcytic anemia     h/o per E-chart  . 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 Noncompliance with medication regimen     /e-chart  .  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 Pyelonephritis     h/o per E-chart  . Thyroid disease   . Depression   . Asthma     inhaler 2xday    History  Substance Use Topics  . Smoking status: Never Smoker   . Smokeless tobacco: Never Used  . Alcohol Use: 0.0 oz/week    0 Standard drinks or equivalent per week     Comment: occ    Family History  Problem Relation Age of Onset  . Cancer Mother   . Diabetes Mother   . Diabetes Father   . Heart disease Father   . Cancer Sister   . Diabetes Sister   . Asthma Son     Allergies  Allergen Reactions  . Bactrim Itching  . Orange Fruit Itching  . Peanut-Containing Drug Products Hives  . Shellfish Allergy Hives  . Sulfa Antibiotics Itching  . Dapsone Itching and Rash    Objective: Temp: 98.3 F (36.8 C) (03/07 1602) Temp Source: Oral (03/07 1602) BP: 155/95 mmHg (03/07 1602) Pulse Rate: 74 (03/07 1602) Body mass index is 20.58 kg/(m^2).  General: she is smiling and in better spirits Oral: no oropharyngeal lesions Skin: few small papules on her face otherwise no rash Lungs: clear Cor: regular S1 and S2 with no murmurs Abdomen: soft and nontender Mood: She does not appear anxious or depressed today  Lab Results Lab Results  Component Value Date   WBC 3.9* 01/31/2015   HGB 10.0* 01/31/2015   HCT 31.5* 01/31/2015   MCV 70.9* 01/31/2015   PLT 311 01/31/2015    Lab Results  Component Value Date   CREATININE 0.57 01/31/2015   BUN 6 01/31/2015   NA 135 01/31/2015   K 3.9 01/31/2015   CL 102 01/31/2015   CO2 25 01/31/2015    Lab Results  Component Value Date   ALT 8 01/31/2015   AST 15 01/31/2015   ALKPHOS 78 01/31/2015   BILITOT 0.3 01/31/2015    Lab Results  Component Value Date   CHOL 184 01/31/2015   HDL 68 01/31/2015   LDLCALC 85 01/31/2015   TRIG 156* 01/31/2015   CHOLHDL 2.7 01/31/2015    Lab Results HIV 1 RNA QUANT (copies/mL)  Date Value  01/31/2015 35728*    12/05/2014 893734*  10/04/2014 287681*   CD4 T CELL ABS (/uL)  Date Value  01/31/2015 20*  10/04/2014 <10*  08/16/2014 <10*     Assessment: Haylea continues her long struggle with adherence related to recurrent esophagitis, stricture and bipolar disorder. She is doing better after recent EGD and dilatation followed by daily fluconazole therapy. She states that she had been taking her Genvoya regularly up until the point where she had difficulty swallowing. Indeed her most recent viral load has come  down some. I will continue her current medications and restart prophylaxis for pneumocystis and Mycobacterium avium. She will try taking Mepron with a snack at bedtime. She is going to be seen soon by Lavell Luster, however treatment adherence nurse. Amber will be able to follow Nickie at home and assist her with her medications.  Plan: 1. Continue Genvoya 2. Continue daily fluconazole 3. Continue Protonix 4. Continue Zyprexa and Cogentin and I asked her to reestablish care with her mental health counselor 5. Restart azithromycin and Mepron 6. Treatment adherence counseling 7. Follow-up in one month   Michel Bickers, MD [facility] for [place] [phone] pager   [phone] cell 02/20/2015, 4:24 PM

## 2015-02-21 ENCOUNTER — Telehealth: Payer: Self-pay | Admitting: *Deleted

## 2015-02-21 NOTE — Telephone Encounter (Signed)
Community Based Health Care Nurse referral received. RN reviewed the patient's chart and made contact with her on 02/20/15. F/u on the patient's latest procedure (EGD). Pt stated she is feeling well just a little sore. Pt had noted Candidias of the Esophagus. Educated the patient that Diflucan is a antifungal medication that she is currently on that could assist her with this fungus. Went further to educate the patient that at this time is important for her to stay hydrated and to limit greasy, high salt, spicy foods. Instructed the patient that a good option could be yogurt and other soft meals. Pt verbalized understanding of education provided. Offered the service of a Social research officer, government and explained the different options/assistance that I can provided. Patient agreed to the assessment with a planned visit for Wed (02/22/15) at 10:30am

## 2015-02-24 ENCOUNTER — Encounter: Payer: Self-pay | Admitting: *Deleted

## 2015-02-24 NOTE — Patient Instructions (Signed)
RN met with client and or caregiver for assessment. RN reviewed Agency Services, [place] Notice of Privacy Policy, Home Safety Management Information Booklet. Home Fire Safety Assessment, Fall Risk Assessment and Suicide Risk Assessment was performed. RN also discussed information on a Living Will, Advanced Directives, and Health Care Power of Attorney. RN and Client/Designated Party educated/reviewed/signed Client Agreement and Consent for Service form along with Patient Rights and Responsibilities statement. RN developed patient specific and centered care plan. RN provided contact information and reviewed how to receive emergency help after hours for schedule changes, billing questions, reporting of safety issues, falls, concerns or any needs/questions. Standard Precaution and Infection control along with interventions to correct or prevent high risk behaviors instructed to the patient. Client/Caregiver reports understanding and agreement with the above 

## 2015-02-24 NOTE — Progress Notes (Signed)
Loma Boston, RN Fri Feb 24, 2015 10:03 AM  Order received on 02/18/15 by Dr. Michel Bickers to evaluate patient for [facility]).  Patient was evaluated on 02/22/2015 for [facility]. Patient was consented to care at this time.   Frequency / Duration of [facility] visits: Effective 02/22/15: 1wk10, 3PRN's for complications with disease process/progression, medication changes or concerns  [facility] will assess for learning needs related to diagnosis and treatment regimen, provide education as needed, fill pill box if needed, address any barriers which may be preventing medication compliance, and communicating with care team including physician and case manager.  Individualized Plan Of Care a. Type of service(s) and care to be delivered: [facility] Nurse b. Frequency and duration of service: Effective 1wk10, 3 prns for complications with disease process/progression, medication changes or concerns c. Activity restrictions: Pt may be up as tolerated and can safely ambulate without the need for a assistive device d. Safety Measures: Standard Precautions/Infection Control e. Service Objectives and Goals: Currently the pt is unable to identify a goal for herself. Current RN goal for this patient is to offer a lot of emotional support and encouragement. Pt appears to become overwhelmed with a lot of visit but may benefit from a longer constant frequency. The concern that I have at this time is around the pt's mental health/depression. Another concern in financial. Pt has some struggles with affording housing but manages well with eating. f. Equipment required: No additional equipment needs at this time g. Functional Limitations: Vision. Pt has corrective glasses that she wears h. Rehabilitation potential: Guarded i. Diet and Nutritional Needs: Regular Diet j. Medications and treatments: Medications have been reconciled and reviewed and are a part of  EPIC electronic file k. Specific therapies if needed: RN l. Pertinent diagnoses: AIDS, Bipolar Disorder, Esophageal Ulcers, NonCompliance, Unintentional Weight Loss m. Expected outcome: Guarded

## 2015-02-24 NOTE — Progress Notes (Signed)
RN visit conducted today (02/22/15) after received referral from Dr Megan Salon stating concerns around pt's long history of not being virally suppressed/deteriorating CD4. On arrival the patient was noted to be appropriately dressed and home was well organized. Subjectively the patient stated "contrary to belief I do not want to die". Pt states she wants to take her medications daily but continues to have difficulty with swallowing large pills. RN offered the option of crushing but the patient stated she is really concerned about the taste. Pt stated she still has trouble swallowing and cannot eat what she would like. Pt has been able to manage eating chopped foods, oatmeal and has ventured  "out to eat" with success. Pt expressed some financial struggles with housing. RN educated the pt on ways to decrease yeast such as limiting yeast in her diet, increasing yogurt intake and using the diflucan. Educated the patient on different techniques for swallowing larger pills. RN offered an appt with the [place] assistance program for a possible voucher to assist with housing. Pt agreed and became very excited about the possibility of having help. Pt states she is tired of being told that her health problems are related to her HIV. RN asked the patient if she has fully accepted that she has HIV and understands that it is treatable. Pt stated she does understand and that her ex-boyfriend is undetectable and is constantly on her about not taking her medications as prescribed. Pt stated she really does not know what CD4 does for her. RN educated the pt on how CD4 cells help our body fight infections. HIV destroys these cells so it leaves you vulnerable to several types of bacteria, fungus and viruses. Pt verbalized understanding of education provided. RN asked the pt what her current goals are. Pt stated she does not have any and really does not know what her purpose for living is. RN expressed her personal purpose in hopes  of inspiring the patient. Pt stated her son told her "Ma, I wanted you to live until you are 24" she became very tearful at this time since her son is not aware of her status. Instructed the patient that ,in my opinion, right now may not be the best time to share her status with her children. Encouraged the patient to focus on getting better and when she is healthy then may be a better time to discuss her status with her children. Pt agreed and verbalized an understanding. Current RN goal for this patient is to offer a lot of emotional support and encouragement. The concern that I have at this time is around the pt's mental health/depression. Another concern in financial. Pt has some struggles with affording housing but manages well with eating. She coupons and has food stamps. Pt stated on average she has 150.00 left over every month that rolls over into the next month. RN arranged a Housing appt for the patient and she is 4th on the waiting list at this time.

## 2015-03-01 ENCOUNTER — Encounter: Payer: Self-pay | Admitting: *Deleted

## 2015-03-01 NOTE — Progress Notes (Signed)
Home Visit conducted with patient today from 11:15am to 12:44. RN's focus was on providing support and encouragement as a form of treatment for the patient's depression. The ultimate goal is to support the patient enough so she will begin to see her self worth and want to comply with her medication regimen. RN educated the patient on transaction versus transformation. Explained to the patient that I believe most people like a immediate response(transaction) and will not allow time for things to occur(transformation). The purpose of this analogy was to discuss her inability to follow her medication regimen. This opened up a discussion on how we could make better chooses and allow time for things to change (my point was to have her thinking  "even though I do not see a immediate response with complying with my medications over time I will begin to see a difference" From this discussion we began to discuss things she is really good at such as organizing and couponing. I asked the patient to teach me her technique for couponing with the intent of showing her she is valuable and an asset to others. Pt has shared with me that in her social group she feels taken advantage off and very little support is given back to her. My goal is to continue to provide support to her and focus on her as a person, not a person with HIV, to gain her trust and make her feel important. Pt appeared very encouraged after our visit and on her way to eat lunch with her sister.

## 2015-03-09 ENCOUNTER — Encounter: Payer: Self-pay | Admitting: *Deleted

## 2015-03-13 LAB — FUNGUS CULTURE W SMEAR

## 2015-03-13 NOTE — Progress Notes (Signed)
RN visit conducted today from 11:15am to 12:20pm in the patient's home. Pt noted to be in good spirits with affect appropriate for conversations. On the arrival of RN's visit the patient was noted to be fixing her hot tea and oatmeal which is a routine for her. Focus of RN's visit will be around routines and how we can incooperate medications in daily routines. RN noted that the patient had already prepared her daily dosage of Mepron to go with her breakfast. RN started the conversation with assessing her ability to eat and swallow. The patient stated that her swallowing has slowly improved and she has noted if she takes her time she can swallow her food, medications and liquids. Vitals were assessed and noted to be normal. Pt denies any diarrhea, changes in bowel patterns, increased vaginal discharge or white patches in the mouth. RN assessed the oral cavity without any concerns noted. RN assessed the pt's support system. Pt states things have been going well but she has noted at times she allows her friends to get her off schedule. Educated her that it is always nice to help others but it is important to surround yourself who will also "feel your cup" meaning give you support as well in whatever way is needed. Instructed the patient that building routines are vital to create good habits. Pt verbalized a understanding of education provided. Pt went further to state today was the first day she began to take her Mepron. RN visually assessed the pt taking her first dose without any problems noted. Pt stated she has plans to prepare her easter dinner tomorrow and will be in [place] for all of next week. RN will have a missed visit for the week of 03/13/15. Reminded the patient to stick to as many of her routines as possible and to be sure she carries her medications with her. Assessed if the patient needs a additional pillbox. Pt declined another pill box at this time.  At this time the RN's plan is to continue to  offer positive reinforcement and create a supportive nonjudgmental environment for the patient.

## 2015-03-13 NOTE — Patient Instructions (Signed)
Please refer to progress note for education provided during today's visit 

## 2015-03-16 ENCOUNTER — Telehealth: Payer: Self-pay | Admitting: *Deleted

## 2015-03-21 ENCOUNTER — Other Ambulatory Visit: Payer: Self-pay | Admitting: Internal Medicine

## 2015-03-21 DIAGNOSIS — F319 Bipolar disorder, unspecified: Secondary | ICD-10-CM

## 2015-03-22 ENCOUNTER — Telehealth: Payer: Self-pay | Admitting: *Deleted

## 2015-03-28 ENCOUNTER — Encounter: Payer: Self-pay | Admitting: Internal Medicine

## 2015-03-28 ENCOUNTER — Ambulatory Visit (INDEPENDENT_AMBULATORY_CARE_PROVIDER_SITE_OTHER): Payer: Medicaid Other | Admitting: Internal Medicine

## 2015-03-28 DIAGNOSIS — B20 Human immunodeficiency virus [HIV] disease: Secondary | ICD-10-CM | POA: Diagnosis present

## 2015-03-28 LAB — COMPREHENSIVE METABOLIC PANEL
ALBUMIN: 4.2 g/dL (ref 3.5–5.2)
ALT: 11 U/L (ref 0–35)
AST: 25 U/L (ref 0–37)
Alkaline Phosphatase: 75 U/L (ref 39–117)
BUN: 8 mg/dL (ref 6–23)
CALCIUM: 8.9 mg/dL (ref 8.4–10.5)
CO2: 26 mEq/L (ref 19–32)
Chloride: 103 mEq/L (ref 96–112)
Creat: 0.81 mg/dL (ref 0.50–1.10)
Glucose, Bld: 89 mg/dL (ref 70–99)
POTASSIUM: 4.1 meq/L (ref 3.5–5.3)
Sodium: 137 mEq/L (ref 135–145)
TOTAL PROTEIN: 7.2 g/dL (ref 6.0–8.3)
Total Bilirubin: 0.4 mg/dL (ref 0.2–1.2)

## 2015-03-28 LAB — CBC
HEMATOCRIT: 29.5 % — AB (ref 36.0–46.0)
Hemoglobin: 9.8 g/dL — ABNORMAL LOW (ref 12.0–15.0)
MCH: 21.8 pg — AB (ref 26.0–34.0)
MCHC: 33.2 g/dL (ref 30.0–36.0)
MCV: 65.7 fL — AB (ref 78.0–100.0)
MPV: 9 fL (ref 8.6–12.4)
Platelets: 242 10*3/uL (ref 150–400)
RBC: 4.49 MIL/uL (ref 3.87–5.11)
RDW: 18.5 % — AB (ref 11.5–15.5)
WBC: 2.7 10*3/uL — ABNORMAL LOW (ref 4.0–10.5)

## 2015-03-28 NOTE — Progress Notes (Signed)
Patient ID: [name], female   DOB: [birth year], 39 y.o.   MRN: [id number]          Patient Active Problem List   Diagnosis Date Noted  . Abdominal pain, acute 08/10/2014    Priority: High  . Unintentional weight loss 07/10/2013    Priority: High  . Esophageal ulcer 07/10/2013    Priority: High  . Bipolar disorder 04/01/2012    Priority: High  . Human immunodeficiency virus (HIV) disease 02/28/2009    Priority: High  . Microcytic anemia 02/27/2009    Priority: High  . Dysphagia 02/14/2015  . Esophagitis   . AIDS   . Salmonella bacteremia   . Noncompliance   . Nausea and vomiting   . Palliative care encounter   . Loss of weight   . Esophageal stricture 12/06/2014  . Dehydration 12/06/2014  . Protein-calorie malnutrition, severe 12/06/2014  . Epigastric pain   . HIV (human immunodeficiency virus infection)   . Intractable vomiting 12/05/2014  . Constipation 10/04/2014  . CIN I (cervical intraepithelial neoplasia I) 01/11/2014  . 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 Elevated lipase 07/10/2013  . Elevated alkaline phosphatase level 07/10/2013  . GENITAL HERPES 02/28/2009    Patient's Medications  New Prescriptions   No medications on file  Previous Medications   ALBUTEROL (PROVENTIL HFA;VENTOLIN HFA) 108 (90 BASE) MCG/ACT INHALER    Inhale 2 puffs into the lungs every 6 (six) hours as needed for wheezing.   ATOVAQUONE (MEPRON) 750 MG/5ML SUSPENSION    Take 10 mLs (1,500 mg total) by mouth daily.   AZITHROMYCIN (ZITHROMAX) 200 MG/5ML SUSPENSION    Take 1,200 mg by mouth once a week. (30 ml)   BENZTROPINE (COGENTIN) 1 MG TABLET    Take 1 tablet (1 mg total) by mouth daily.   ELVITEGRAVIR-COBICISTAT-EMTRICITABINE-TENOFOVIR (GENVOYA) 150-150-200-10 MG TABS TABLET    Take 1 tablet by mouth daily with breakfast.   FLUCONAZOLE (DIFLUCAN) 100 MG TABLET    Take 1 tablet (100 mg total) by mouth daily.   MULTIPLE VITAMINS-MINERALS (ADULT GUMMY) CHEW    Chew 2  capsules by mouth daily.   OLANZAPINE (ZYPREXA) 5 MG TABLET    TAKE 1 TABLET BY MOUTH AT BEDTIME   ONDANSETRON (ZOFRAN) 8 MG TABLET    Take 8 mg by mouth every 8 (eight) hours as needed for nausea or vomiting.   PANTOPRAZOLE (PROTONIX) 40 MG TABLET    Take 1 tablet (40 mg total) by mouth 2 (two) times daily.   VALACYCLOVIR (VALTREX) 1000 MG TABLET    Take 1 tablet (1,000 mg total) by mouth 3 (three) times daily.  Modified Medications   No medications on file  Discontinued Medications   No medications on file    Subjective: [name] is in for her routine HIV follow-up visit. She continues to have problems with dysphasia with solid foods and some medications but she has been able to take and tolerate her Genvoya. She recalls missing only one dose since her last visit. She takes it at 10 AM with oatmeal. She is not having any odynophagia. She states that her mood is still just in "so-so". She is taking her psychiatric medications but has not been back to [place] yet. She is scheduled for repeat esophageal dilatation at the end of this month.  Review of Systems: Constitutional: negative Eyes: negative Ears, nose, mouth, throat, and face: negative Respiratory: negative Cardiovascular: negative Gastrointestinal: negative Genitourinary:negative  Past Medical History  Diagnosis Date  .  HIV positive   . Hepatitis B     /E-chart  . Microcytic anemia     h/o per E-chart  . 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 Noncompliance with medication regimen     /e-chart  . 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 Pyelonephritis     h/o per E-chart  . Thyroid disease   . Depression   . Asthma     inhaler 2xday    History  Substance Use Topics  . Smoking status: Never Smoker   . Smokeless tobacco: Never Used  . Alcohol Use: 0.0 oz/week    0 Standard drinks or equivalent per week     Comment: occ    Family History    Problem Relation Age of Onset  . Cancer Mother   . Diabetes Mother   . Diabetes Father   . Heart disease Father   . Cancer Sister   . Diabetes Sister   . Asthma Son     Allergies  Allergen Reactions  . Bactrim Itching  . Orange Fruit Itching  . Peanut-Containing Drug Products Hives  . Shellfish Allergy Hives  . Sulfa Antibiotics Itching  . Dapsone Itching and Rash    Objective: Temp: 98.4 F (36.9 C) (04/12 0913) Temp Source: Oral (04/12 0913) BP: 111/73 mmHg (04/12 0913) Pulse Rate: 79 (04/12 0913) Body mass index is 21.19 kg/(m^2).  General: she is smiling and in goodts Oral: no oropharyngeal lesions Skin: no rash Lungs: clear Cor: regular S1 and S2 with no murmurs Abdomen: soft and nontender Mood: She does not appear anxious or depressed   Lab Results Lab Results  Component Value Date   WBC 3.9* 01/31/2015   HGB 10.0* 01/31/2015   HCT 31.5* 01/31/2015   MCV 70.9* 01/31/2015   PLT 311 01/31/2015    Lab Results  Component Value Date   CREATININE 0.57 01/31/2015   BUN 6 01/31/2015   NA 135 01/31/2015   K 3.9 01/31/2015   CL 102 01/31/2015   CO2 25 01/31/2015    Lab Results  Component Value Date   ALT 8 01/31/2015   AST 15 01/31/2015   ALKPHOS 78 01/31/2015   BILITOT 0.3 01/31/2015    Lab Results  Component Value Date   CHOL 184 01/31/2015   HDL 68 01/31/2015   LDLCALC 85 01/31/2015   TRIG 156* 01/31/2015   CHOLHDL 2.7 01/31/2015    Lab Results HIV 1 RNA QUANT (copies/mL)  Date Value  01/31/2015 35728*  12/05/2014 998338*  10/04/2014 250539*   CD4 T CELL ABS (/uL)  Date Value  01/31/2015 20*  10/04/2014 <10*  08/16/2014 <10*     Assessment: It seems like her adherence has improved. Although she is having some dysphasia she is not having difficulty swallowing Genvoya. She is also taking and tolerating her pneumocystis prophylaxis with atovaquone. She needs to restart azithromycin. I did encourage her to go back to [place] as soon as  possible for behavioral health counseling.   Plan: 1. Continue Genvoya 2. Recheck lab work today 3. reestablish behavioral health counseling  4.  follow-up in one month   Michel Bickers, MD [facility] [place] [phone] pager   [phone] cell 03/28/2015, 9:29 AM

## 2015-03-29 ENCOUNTER — Encounter (HOSPITAL_COMMUNITY): Payer: Self-pay | Admitting: *Deleted

## 2015-03-29 ENCOUNTER — Telehealth: Payer: Self-pay | Admitting: *Deleted

## 2015-03-29 LAB — HIV-1 RNA QUANT-NO REFLEX-BLD
HIV 1 RNA Quant: 763559 copies/mL — ABNORMAL HIGH (ref <20)
HIV-1 RNA QUANT, LOG: 5.88 {Log} — AB (ref <1.30)

## 2015-03-29 LAB — T-HELPER CELL (CD4) - (RCID CLINIC ONLY)
CD4 T CELL HELPER: 3 % — AB (ref 33–55)
CD4 T Cell Abs: 20 /uL — ABNORMAL LOW (ref 400–2700)

## 2015-03-29 NOTE — Telephone Encounter (Signed)
Via text communication RN received a message from [phone] stating " I hate last minute cancelation but I don't feel well" .  RN responded by stating "It is fine!! Is there anything I can do to help". Message from [phone] stating "I just need to rest". RN responded back stating "Ok  I understand. I am here if you need me. Take Care". Response received back stating "thanks" At this time visit has been cancelled for today. RN will continue to make contact with the patient on another date to see if she would like to reschedule for another date this week or resume next week.

## 2015-04-05 ENCOUNTER — Telehealth: Payer: Self-pay | Admitting: *Deleted

## 2015-04-10 ENCOUNTER — Other Ambulatory Visit: Payer: Self-pay | Admitting: Gastroenterology

## 2015-04-11 ENCOUNTER — Ambulatory Visit (HOSPITAL_COMMUNITY)
Admission: RE | Admit: 2015-04-11 | Discharge: 2015-04-11 | Disposition: A | Discharge status: Home / Self Care | Payer: Medicaid Other | Source: Ambulatory Visit | Attending: Gastroenterology | Admitting: Gastroenterology

## 2015-04-11 ENCOUNTER — Encounter (HOSPITAL_COMMUNITY)
Admission: RE | Disposition: A | Discharge status: Home / Self Care | Payer: Self-pay | Source: Ambulatory Visit | Attending: Gastroenterology

## 2015-04-11 ENCOUNTER — Encounter (HOSPITAL_COMMUNITY): Payer: Self-pay | Admitting: *Deleted

## 2015-04-11 ENCOUNTER — Ambulatory Visit (HOSPITAL_COMMUNITY): Payer: Medicaid Other | Admitting: Anesthesiology

## 2015-04-11 ENCOUNTER — Ambulatory Visit (HOSPITAL_COMMUNITY): Payer: Medicaid Other

## 2015-04-11 DIAGNOSIS — Z881 Allergy status to other antibiotic agents status: Secondary | ICD-10-CM | POA: Insufficient documentation

## 2015-04-11 DIAGNOSIS — K222 Esophageal obstruction: Secondary | ICD-10-CM | POA: Insufficient documentation

## 2015-04-11 DIAGNOSIS — J45909 Unspecified asthma, uncomplicated: Secondary | ICD-10-CM | POA: Insufficient documentation

## 2015-04-11 DIAGNOSIS — B1910 Unspecified viral hepatitis B without hepatic coma: Secondary | ICD-10-CM | POA: Insufficient documentation

## 2015-04-11 DIAGNOSIS — Z882 Allergy status to sulfonamides status: Secondary | ICD-10-CM | POA: Insufficient documentation

## 2015-04-11 DIAGNOSIS — Z21 Asymptomatic human immunodeficiency virus [HIV] infection status: Secondary | ICD-10-CM | POA: Diagnosis not present

## 2015-04-11 DIAGNOSIS — Z9114 Patient's other noncompliance with medication regimen: Secondary | ICD-10-CM | POA: Insufficient documentation

## 2015-04-11 DIAGNOSIS — Z91010 Allergy to peanuts: Secondary | ICD-10-CM | POA: Diagnosis not present

## 2015-04-11 DIAGNOSIS — F329 Major depressive disorder, single episode, unspecified: Secondary | ICD-10-CM | POA: Insufficient documentation

## 2015-04-11 DIAGNOSIS — F319 Bipolar disorder, unspecified: Secondary | ICD-10-CM | POA: Insufficient documentation

## 2015-04-11 DIAGNOSIS — R1310 Dysphagia, unspecified: Secondary | ICD-10-CM

## 2015-04-11 DIAGNOSIS — Z91018 Allergy to other foods: Secondary | ICD-10-CM | POA: Insufficient documentation

## 2015-04-11 DIAGNOSIS — Z8711 Personal history of peptic ulcer disease: Secondary | ICD-10-CM | POA: Insufficient documentation

## 2015-04-11 DIAGNOSIS — F209 Schizophrenia, unspecified: Secondary | ICD-10-CM | POA: Diagnosis not present

## 2015-04-11 DIAGNOSIS — Z91013 Allergy to seafood: Secondary | ICD-10-CM | POA: Diagnosis not present

## 2015-04-11 DIAGNOSIS — Z9851 Tubal ligation status: Secondary | ICD-10-CM | POA: Diagnosis not present

## 2015-04-11 HISTORY — PX: ESOPHAGOGASTRODUODENOSCOPY (EGD) WITH PROPOFOL: SHX5813

## 2015-04-11 HISTORY — PX: SAVORY DILATION: SHX5439

## 2015-04-11 SURGERY — ESOPHAGOGASTRODUODENOSCOPY (EGD) WITH PROPOFOL
Anesthesia: Monitor Anesthesia Care

## 2015-04-11 MED ORDER — PROPOFOL 10 MG/ML IV BOLUS
INTRAVENOUS | Status: AC
  Filled 2015-04-11: qty 20

## 2015-04-11 MED ORDER — LIDOCAINE HCL (CARDIAC) 20 MG/ML IV SOLN
INTRAVENOUS | Status: DC | PRN
  Administered 2015-04-11: 50 mg via INTRAVENOUS
  Administered 2015-04-11: 20 mg via INTRAVENOUS
  Administered 2015-04-11: 30 mg via INTRAVENOUS
  Administered 2015-04-11: 50 mg via INTRAVENOUS

## 2015-04-11 MED ORDER — PROPOFOL INFUSION 10 MG/ML OPTIME
INTRAVENOUS | Status: DC | PRN
  Administered 2015-04-11: 75 ug/kg/min via INTRAVENOUS

## 2015-04-11 MED ORDER — LIDOCAINE HCL (CARDIAC) 20 MG/ML IV SOLN
INTRAVENOUS | Status: AC
  Filled 2015-04-11: qty 5

## 2015-04-11 MED ORDER — PROPOFOL 10 MG/ML IV BOLUS
INTRAVENOUS | Status: DC | PRN
  Administered 2015-04-11: 40 mg via INTRAVENOUS
  Administered 2015-04-11 (×2): 20 mg via INTRAVENOUS
  Administered 2015-04-11: 50 mg via INTRAVENOUS

## 2015-04-11 MED ORDER — LACTATED RINGERS IV SOLN
INTRAVENOUS | Status: DC
  Administered 2015-04-11: 14:00:00 via INTRAVENOUS
  Administered 2015-04-11: 1000 mL via INTRAVENOUS

## 2015-04-11 MED ORDER — SODIUM CHLORIDE 0.9 % IV SOLN: INTRAVENOUS | Status: DC

## 2015-04-11 MED ORDER — ESMOLOL HCL 10 MG/ML IV SOLN
INTRAVENOUS | Status: DC | PRN
  Administered 2015-04-11: 5 mg via INTRAVENOUS

## 2015-04-11 MED ORDER — GLYCOPYRROLATE 0.2 MG/ML IJ SOLN
INTRAMUSCULAR | Status: DC | PRN
  Administered 2015-04-11: 0.1 mg via INTRAVENOUS

## 2015-04-11 MED ORDER — LIDOCAINE HCL (CARDIAC) 20 MG/ML IV SOLN
INTRAVENOUS | Status: AC
Start: 2015-04-11 — End: 2015-04-11
  Filled 2015-04-11: qty 5

## 2015-04-11 SURGICAL SUPPLY — 15 items

## 2015-04-11 NOTE — Progress Notes (Signed)
Patient ID: [name], female   DOB: 30-Oct-1976, 39 y.o.   MRN: [id number]  Date of Initial H&P: 03/27/15  History reviewed, patient examined, no change in status, stable for surgery.

## 2015-04-11 NOTE — Anesthesia Postprocedure Evaluation (Signed)
Anesthesia Post Note  Patient: [name]  Procedure(s) Performed: Procedure(s) (LRB): ESOPHAGOGASTRODUODENOSCOPY (EGD) WITH PROPOFOL (N/A) SAVORY DILATION (N/A)  Anesthesia type: MAC  Patient location: PACU  Post pain: Pain level controlled and Adequate analgesia  Post assessment: Post-op Vital signs reviewed, Patient's Cardiovascular Status Stable and Respiratory Function Stable  Last Vitals:  Filed Vitals:   04/11/15 1445  BP:   Pulse: 63  Temp:   Resp: 21    Post vital signs: Reviewed and stable  Level of consciousness: awake, alert  and oriented  Complications: No apparent anesthesia complications

## 2015-04-11 NOTE — Op Note (Signed)
[facility] [place], [address]   ENDOSCOPY PROCEDURE REPORT  PATIENT: [name], [name]  MR#: [id number] BIRTHDATE: 02-22-1976 , 38  yrs. old GENDER: female ENDOSCOPIST: Wilford Corner, MD REFERRED BY: PROCEDURE DATE:  2015/04/14 PROCEDURE:  EGD w/ wire guided (savary) dilation ASA CLASS:     Class III INDICATIONS:  dysphagia and history of esophageal stricture. MEDICATIONS: Monitored anesthesia care and Per Anesthesia TOPICAL ANESTHETIC:  DESCRIPTION OF PROCEDURE: After the risks benefits and alternatives of the procedure were thoroughly explained, informed consent was obtained.  The    endoscope was introduced through the mouth and advanced to the second portion of the duodenum , Without limitations.  The instrument was slowly withdrawn as the mucosa was fully examined. Estimated blood loss is zero unless otherwise noted in this procedure report.    Tight mid-esophageal stricture (32 cm - 35 cm from the incisors). Pediatric endoscope (8 mm diameter) unable to traverse the stricture. Fluoroscopy then used to pass a spring-tipped guidewire and the endoscope was withdrawn in the usual fashion. A 10 mm Savary dilator passed over the wire using fluoro and no resistance or heme seen. An 11 mm dilator passed with minimal resistance and minimal heme. A 12.8 mm dilator passed with mild resistance and minimal heme. A 3mm dilator passed with moderate resistance and heme noted and then the wire and dilator were both removed. The endoscope was reinserted and the stricture was noted to be successfully dilated and easily traversed post-dilation into the stomach and duodenum. Small amount of fresh blood noted in the antrum without any visible mucosal abnormalities. Duodenal bulb and 2nd portion of the duodenum normal in appearance.     Retroflexed views revealed no abnormalities.     The scope was then withdrawn from the patient and the procedure  completed.  COMPLICATIONS: There were no immediate complications.  ENDOSCOPIC IMPRESSION:     Tight mid-esophageal stricture dilated to 14 mm (see above) for details  RECOMMENDATIONS:     Protonix PO QD; Soft solids and advance as tolerated   eSigned:  Wilford Corner, MD 2015-04-14 2:32 PM    CC:  CPT CODES: ICD CODES:  The ICD and CPT codes recommended by this software are interpretations from the data that the clinical staff has captured with the software.  The verification of the translation of this report to the ICD and CPT codes and modifiers is the sole responsibility of the health care institution and practicing physician where this report was generated.  [employer] will not be held responsible for the validity of the ICD and CPT codes included on this report.  AMA assumes no liability for data contained or not contained herein. CPT is a Designer, television/film set of the [employer].  PATIENT NAME:  [name], [name] MR#: [id number]

## 2015-04-11 NOTE — Addendum Note (Signed)
Addended by: Wilford Corner on: 04/11/2015 10:49 AM   Modules accepted: Orders

## 2015-04-11 NOTE — Transfer of Care (Signed)
Immediate Anesthesia Transfer of Care Note  Patient: [name]  Procedure(s) Performed: Procedure(s): ESOPHAGOGASTRODUODENOSCOPY (EGD) WITH PROPOFOL (N/A) SAVORY DILATION (N/A)  Patient Location: PACU  Anesthesia Type:MAC  Level of Consciousness: Patient easily awoken, sedated, comfortable, cooperative, following commands, responds to stimulation.   Airway & Oxygen Therapy: Patient spontaneously breathing, ventilating well, oxygen via simple oxygen mask.  Post-op Assessment: Report given to PACU RN, vital signs reviewed and stable, moving all extremities.   Post vital signs: Reviewed and stable.  Complications: No apparent anesthesia complications

## 2015-04-11 NOTE — H&P (View-Only) (Signed)
Patient ID: [name], female   DOB: 17-Sep-1976, 39 y.o.   MRN: [id number]  Date of Initial H&P: 03/27/15  History reviewed, patient examined, no change in status, stable for surgery.

## 2015-04-11 NOTE — Interval H&P Note (Signed)
History and Physical Interval Note:  04/11/2015 1:27 PM  [name]  has presented today for surgery, with the diagnosis of esoph.stricture  The various methods of treatment have been discussed with the patient and family. After consideration of risks, benefits and other options for treatment, the patient has consented to  Procedure(s): ESOPHAGOGASTRODUODENOSCOPY (EGD) WITH PROPOFOL (N/A) SAVORY DILATION (N/A) as a surgical intervention .  The patient's history has been reviewed, patient examined, no change in status, stable for surgery.  I have reviewed the patient's chart and labs.  Questions were answered to the patient's satisfaction.     Steptoe C.

## 2015-04-11 NOTE — Anesthesia Preprocedure Evaluation (Signed)
Anesthesia Evaluation  Patient identified by MRN, date of birth, ID band Patient awake    Reviewed: Allergy & Precautions, NPO status , Patient's Chart, lab work & pertinent test results  Airway Mallampati: II   Neck ROM: full    Dental   Pulmonary asthma ,  breath sounds clear to auscultation        Cardiovascular negative cardio ROS  Rhythm:regular Rate:Normal     Neuro/Psych PSYCHIATRIC DISORDERS Depression Bipolar Disorder Schizophrenia    GI/Hepatic PUD, (+) Hepatitis -, B  Endo/Other    Renal/GU      Musculoskeletal   Abdominal   Peds  Hematology  (+) HIV,   Anesthesia Other Findings   Reproductive/Obstetrics                             Anesthesia Physical Anesthesia Plan  ASA: II  Anesthesia Plan: MAC   Post-op Pain Management:    Induction: Intravenous  Airway Management Planned: Nasal Cannula  Additional Equipment:   Intra-op Plan:   Post-operative Plan:   Informed Consent: I have reviewed the patients History and Physical, chart, labs and discussed the procedure including the risks, benefits and alternatives for the proposed anesthesia with the patient or authorized representative who has indicated his/her understanding and acceptance.     Plan Discussed with: CRNA, Anesthesiologist and Surgeon  Anesthesia Plan Comments:         Anesthesia Quick Evaluation

## 2015-04-11 NOTE — Progress Notes (Signed)
Dr. Michail Sermon doesn't want the patient to continue on carafate. Patient notified. [facility]

## 2015-04-11 NOTE — Discharge Instructions (Signed)
Esophagogastroduodenoscopy °Care After °Refer to this sheet in the next few weeks. These instructions provide you with information on caring for yourself after your procedure. Your caregiver may also give you more specific instructions. Your treatment has been planned according to current medical practices, but problems sometimes occur. Call your caregiver if you have any problems or questions after your procedure.  °HOME CARE INSTRUCTIONS °· Do not eat or drink anything until the numbing medicine (local anesthetic) has worn off and your gag reflex has returned. You will know that the local anesthetic has worn off when you can swallow comfortably. °· Do not drive for 12 hours after the procedure or as directed by your caregiver. °· Only take medicines as directed by your caregiver. °SEEK MEDICAL CARE IF:  °· You cannot stop coughing. °· You are not urinating at all or less than usual. °SEEK IMMEDIATE MEDICAL CARE IF: °· You have difficulty swallowing. °· You cannot eat or drink. °· You have worsening throat or chest pain. °· You have dizziness, lightheadedness, or you faint. °· You have nausea or vomiting. °· You have chills. °· You have a fever. °· You have severe abdominal pain. °· You have black, tarry, or bloody stools. °Document Released: 11/18/2012 Document Reviewed: 11/18/2012 °ExitCare® Patient Information ©2015 [employer]. This information is not intended to replace advice given to you by your health care provider. Make sure you discuss any questions you have with your health care provider. ° ° °Conscious Sedation, Adult, Care After °Refer to this sheet in the next few weeks. These instructions provide you with information on caring for yourself after your procedure. Your health care provider may also give you more specific instructions. Your treatment has been planned according to current medical practices, but problems sometimes occur. Call your health care provider if you have any problems or  questions after your procedure. °WHAT TO EXPECT AFTER THE PROCEDURE  °After your procedure: °· You may feel sleepy, clumsy, and have poor balance for several hours. °· Vomiting may occur if you eat too soon after the procedure. °HOME CARE INSTRUCTIONS °· Do not participate in any activities where you could become injured for at least 24 hours. Do not: °¨ Drive. °¨ Swim. °¨ Ride a bicycle. °¨ Operate heavy machinery. °¨ Cook. °¨ Use power tools. °¨ Climb ladders. °¨ Work from a high place. °· Do not make important decisions or sign legal documents until you are improved. °· If you vomit, drink water, juice, or soup when you can drink without vomiting. Make sure you have little or no nausea before eating solid foods. °· Only take over-the-counter or prescription medicines for pain, discomfort, or fever as directed by your health care provider. °· Make sure you and your family fully understand everything about the medicines given to you, including what side effects may occur. °· You should not drink alcohol, take sleeping pills, or take medicines that cause drowsiness for at least 24 hours. °· If you smoke, do not smoke without supervision. °· If you are feeling better, you may resume normal activities 24 hours after you were sedated. °· Keep all appointments with your health care provider. °SEEK MEDICAL CARE IF: °· Your skin is pale or bluish in color. °· You continue to feel nauseous or vomit. °· Your pain is getting worse and is not helped by medicine. °· You have bleeding or swelling. °· You are still sleepy or feeling clumsy after 24 hours. °SEEK IMMEDIATE MEDICAL CARE IF: °· You develop a   rash. °· You have difficulty breathing. °· You develop any type of allergic problem. °· You have a fever. °MAKE SURE YOU: °· Understand these instructions. °· Will watch your condition. °· Will get help right away if you are not doing well or get worse. °Document Released: 09/22/2013 Document Reviewed: 09/22/2013 °ExitCare®  Patient Information ©2015 [employer]. This information is not intended to replace advice given to you by your health care provider. Make sure you discuss any questions you have with your health care provider. ° °

## 2015-04-12 ENCOUNTER — Telehealth: Payer: Self-pay | Admitting: *Deleted

## 2015-04-12 ENCOUNTER — Other Ambulatory Visit: Payer: Medicaid Other | Admitting: *Deleted

## 2015-04-12 ENCOUNTER — Encounter (HOSPITAL_COMMUNITY): Payer: Self-pay | Admitting: Gastroenterology

## 2015-04-12 NOTE — Telephone Encounter (Signed)
RN received communication from patient stating she would like to cancel our visit this week. Pt states she has a appt for another esohageal stretch and would just like to rest up to that point. RN offered assistance to the patient at this time. Pt declined but stated she would contact me if she needs anything

## 2015-04-19 ENCOUNTER — Telehealth: Payer: Self-pay | Admitting: *Deleted

## 2015-04-19 ENCOUNTER — Other Ambulatory Visit: Payer: Medicaid Other | Admitting: *Deleted

## 2015-04-26 ENCOUNTER — Telehealth: Payer: Self-pay | Admitting: *Deleted

## 2015-04-27 ENCOUNTER — Telehealth: Payer: Self-pay | Admitting: *Deleted

## 2015-05-04 ENCOUNTER — Encounter: Payer: Self-pay | Admitting: Internal Medicine

## 2015-05-04 ENCOUNTER — Ambulatory Visit (INDEPENDENT_AMBULATORY_CARE_PROVIDER_SITE_OTHER): Payer: Medicaid Other | Admitting: Internal Medicine

## 2015-05-04 DIAGNOSIS — B20 Human immunodeficiency virus [HIV] disease: Secondary | ICD-10-CM

## 2015-05-04 NOTE — Progress Notes (Signed)
Patient ID: [name], female   DOB: 1976-10-05, 39 y.o.   MRN: [id number]          Patient Active Problem List   Diagnosis Date Noted  . Bipolar disorder 04/01/2012    Priority: High  . Human immunodeficiency virus (HIV) disease 02/28/2009    Priority: High  . Salmonella bacteremia   . Esophageal stricture 12/06/2014  . Protein-calorie malnutrition, severe 12/06/2014  . Constipation 10/04/2014  . CIN I (cervical intraepithelial neoplasia I) 01/11/2014  . 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 Elevated lipase 07/10/2013  . Elevated alkaline phosphatase level 07/10/2013  . Esophageal ulcer 07/10/2013  . GENITAL HERPES 02/28/2009  . Microcytic anemia 02/27/2009    Patient's Medications  New Prescriptions   No medications on file  Previous Medications   ACYCLOVIR (ZOVIRAX) 200 MG/5ML SUSPENSION    Take 400 mg by mouth 2 (two) times daily.    ALBUTEROL (PROVENTIL HFA;VENTOLIN HFA) 108 (90 BASE) MCG/ACT INHALER    Inhale 2 puffs into the lungs every 6 (six) hours as needed for wheezing.   ATOVAQUONE (MEPRON) 750 MG/5ML SUSPENSION    Take 10 mLs (1,500 mg total) by mouth daily.   BENZTROPINE (COGENTIN) 1 MG TABLET    Take 1 tablet (1 mg total) by mouth daily.   DIPHENHYDRAMINE (BENADRYL) 25 MG CAPSULE    Take 25 mg by mouth 1 day or 1 dose.   ELVITEGRAVIR-COBICISTAT-EMTRICITABINE-TENOFOVIR (GENVOYA) 150-150-200-10 MG TABS TABLET    Take 1 tablet by mouth daily with breakfast.   FLUCONAZOLE (DIFLUCAN) 100 MG TABLET    Take 1 tablet (100 mg total) by mouth daily.   MULTIPLE VITAMINS-MINERALS (ADULT GUMMY) CHEW    Chew 2 capsules by mouth daily.   OLANZAPINE (ZYPREXA) 5 MG TABLET    TAKE 1 TABLET BY MOUTH AT BEDTIME   PANTOPRAZOLE (PROTONIX) 40 MG TABLET    Take 1 tablet (40 mg total) by mouth 2 (two) times daily.   VALACYCLOVIR (VALTREX) 1000 MG TABLET    Take 1 tablet (1,000 mg total) by mouth 3 (three) times daily.  Modified Medications   No medications on file    Discontinued Medications   No medications on file    Subjective: [name] is in for her routine HIV follow-up visit. She states that she has been having difficulty with her youngest son. He frequently misses the bus to school. She notes that this often causes her to miss her morning dose of Genvoya. She is not taking her Zyprexa at bedtime. She states that she usually falls asleep about 7:30 PM. She will usually wake up around midnight or 1 AM but does not take her Zyprexa at that time. She has not seen her counselor. She underwent repeat upper endoscopy 1 month ago and had dilatation of her peptic esophageal stricture. There was no evidence of infectious esophagitis at that time. She is not having any dysphagia or odynophagia. She denies having any trouble swallowing her pills. She states that she did make a resolution with herself at the start of this year to take better care of herself.  Review of Systems: Constitutional: positive for malaise, negative for anorexia, chills, fevers, sweats and weight loss Eyes: negative Ears, nose, mouth, throat, and face: negative Respiratory: negative Cardiovascular: negative Gastrointestinal: negative for abdominal pain, constipation, diarrhea, dysphagia, nausea, odynophagia, reflux symptoms and vomiting Genitourinary:negative  Past Medical History  Diagnosis Date  . HIV positive   . Hepatitis B     /E-chart  .  Microcytic anemia     h/o per E-chart  . 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 Noncompliance with medication regimen     /e-chart  . 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 Pyelonephritis     h/o per E-chart  . Thyroid disease   . Depression   . Asthma     inhaler 2xday    History  Substance Use Topics  . Smoking status: Never Smoker   . Smokeless tobacco: Never Used  . Alcohol Use: 0.0 oz/week    0 Standard drinks or equivalent per week     Comment: occ     Family History  Problem Relation Age of Onset  . Cancer Mother   . Diabetes Mother   . Diabetes Father   . Heart disease Father   . Cancer Sister   . Diabetes Sister   . Asthma Son     Allergies  Allergen Reactions  . Bactrim Itching  . Orange Fruit Itching  . Peanut-Containing Drug Products Hives  . Shellfish Allergy Hives  . Sulfa Antibiotics Itching  . Dapsone Itching and Rash    Objective: Temp: 97.6 F (36.4 C) (05/19 0920) Temp Source: Oral (05/19 0920) BP: 112/72 mmHg (05/19 0920) Pulse Rate: 66 (05/19 0920) Body mass index is 22.03 kg/(m^2).  General: Her weight is up 4 pounds to 162.5 Oral: No oropharyngeal lesions Skin: No rash Lungs: Clear Cor: Regular S1 and S2 with no murmurs Abdomen: Soft and nontender Mood: Normal. She does not appear anxious or depressed  Lab Results Lab Results  Component Value Date   WBC 2.7* 03/28/2015   HGB 9.8* 03/28/2015   HCT 29.5* 03/28/2015   MCV 65.7* 03/28/2015   PLT 242 03/28/2015    Lab Results  Component Value Date   CREATININE 0.81 03/28/2015   BUN 8 03/28/2015   NA 137 03/28/2015   K 4.1 03/28/2015   CL 103 03/28/2015   CO2 26 03/28/2015    Lab Results  Component Value Date   ALT 11 03/28/2015   AST 25 03/28/2015   ALKPHOS 75 03/28/2015   BILITOT 0.4 03/28/2015    Lab Results  Component Value Date   CHOL 184 01/31/2015   HDL 68 01/31/2015   LDLCALC 85 01/31/2015   TRIG 156* 01/31/2015   CHOLHDL 2.7 01/31/2015    Lab Results HIV 1 RNA QUANT (copies/mL)  Date Value  03/28/2015 709628*  01/31/2015 35728*  12/05/2014 366294*   CD4 T CELL ABS (/uL)  Date Value  03/28/2015 20*  01/31/2015 20*  10/04/2014 <10*     Assessment: [name] continues to struggle with adherence. She understands that if she does not take her Genvoya consistently there is a good chance that her HIV will become resistant. She says that she cannot identify anything that we could do here that would help  her.  She is not taking her Zyprexa and is not in counseling currently she is interested in seeing one of our counselors here and I did a warm hand off to [name], our behavioral health counselor. He met with her briefly today and a visit is scheduled for her to see him early next week.  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 Fortunately both problems are inactive at this time and she's not having any difficulty swallowing her medications. She is not taking her Protonix twice daily as instructed.  Plan:  1. I encouraged her to take her Genvoya every single day with a meal. 2. I encouraged her to take her Zyprexa daily 3. She will continue to work with our Social research officer, government, Games developer 4. She will start seeing [name] here for behavioral health counseling 5. Check repeat viral load, genotype and integrase resistance testing 6. Follow-up in 4 weeks   Michel Bickers, MD [facility] for [place] [phone] pager   [phone] cell 05/04/2015, 10:04 AM

## 2015-05-05 LAB — HIV-1 RNA QUANT-NO REFLEX-BLD
HIV 1 RNA Quant: 515576 copies/mL — ABNORMAL HIGH (ref <20)
HIV-1 RNA QUANT, LOG: 5.71 {Log} — AB (ref <1.30)

## 2015-05-08 ENCOUNTER — Ambulatory Visit: Payer: Medicaid Other

## 2015-05-10 NOTE — Telephone Encounter (Signed)
Purpose of this communication is to relay that that the set frequency for home visits this week has been changed due to a missed visit. Communication has been made with the patient to ensure needs have been met and to offer a home visit. At this time a return call has not been received before the business week is out. Next week the Community Based Health Care Nurse plans to continue contact with the patient to offer any services or attempt to address any needs the patient expresses that is reasonable.  

## 2015-05-12 LAB — HIV-1 GENOTYPR PLUS

## 2015-05-12 LAB — HIV-1 INTEGRASE GENOTYPE

## 2015-05-23 ENCOUNTER — Encounter: Payer: Self-pay | Admitting: *Deleted

## 2015-05-23 NOTE — Progress Notes (Signed)
Patient ID: [name], female   DOB: 1976-05-10, 39 y.o.   MRN: [id number] The intent of this communication is to inform the Health Care Team that this patient will be discharged from [facility]).  Greater than 3 attempts have been made to re-engage the patient along with a mailed letter to the patient's home without any success. Based on this information the services will be terminated since the patient refuses to honor client responsibilities as set forth in Client Rights and Responsibilities statement.  Moving forward, the [facility] will be willing to reopen the patient to services if and when the patient is ready to discuss medication adherence and HIV disease management. Effective 05/23/2015 patient will be discharged and removed from [facility] active patient listing.   This communication has been routed to the signing physician to communicate that the patient is no longer followed by the [facility] based Health Care Nurse.

## 2015-05-23 NOTE — Telephone Encounter (Signed)
RN communicated with the patient today stating "Good Morning, I was wondering if we can start back making visits? This was my last scheduled week so I can get more orders if you are interested. If you are not ready I can discharge."   Pt responded by stating " I will let you know what my schedule looks like. I will do so by tomorrow". RN responded with "Ok, great". At this time I am waiting on a response from the patient before requesting additional orders from Dr Megan Salon. My focus is for the patient to want the service and see the benefit not just complying with what is asked of her.

## 2015-06-01 ENCOUNTER — Ambulatory Visit: Payer: Medicaid Other | Admitting: Internal Medicine

## 2015-09-08 ENCOUNTER — Encounter (HOSPITAL_COMMUNITY): Payer: Self-pay | Admitting: *Deleted

## 2015-09-11 ENCOUNTER — Ambulatory Visit (HOSPITAL_COMMUNITY): Payer: Medicaid Other | Admitting: Certified Registered"

## 2015-09-11 ENCOUNTER — Ambulatory Visit (HOSPITAL_COMMUNITY): Payer: Medicaid Other

## 2015-09-11 ENCOUNTER — Encounter (HOSPITAL_COMMUNITY)
Admission: RE | Disposition: A | Discharge status: Home / Self Care | Payer: Self-pay | Source: Ambulatory Visit | Attending: Gastroenterology

## 2015-09-11 ENCOUNTER — Encounter (HOSPITAL_COMMUNITY): Payer: Self-pay | Admitting: *Deleted

## 2015-09-11 ENCOUNTER — Ambulatory Visit (HOSPITAL_COMMUNITY)
Admission: RE | Admit: 2015-09-11 | Discharge: 2015-09-11 | Disposition: A | Discharge status: Home / Self Care | Payer: Medicaid Other | Source: Ambulatory Visit | Attending: Gastroenterology | Admitting: Gastroenterology

## 2015-09-11 DIAGNOSIS — Z888 Allergy status to other drugs, medicaments and biological substances status: Secondary | ICD-10-CM | POA: Diagnosis not present

## 2015-09-11 DIAGNOSIS — Z881 Allergy status to other antibiotic agents status: Secondary | ICD-10-CM | POA: Diagnosis not present

## 2015-09-11 DIAGNOSIS — K279 Peptic ulcer, site unspecified, unspecified as acute or chronic, without hemorrhage or perforation: Secondary | ICD-10-CM | POA: Insufficient documentation

## 2015-09-11 DIAGNOSIS — K219 Gastro-esophageal reflux disease without esophagitis: Secondary | ICD-10-CM | POA: Diagnosis not present

## 2015-09-11 DIAGNOSIS — Z91013 Allergy to seafood: Secondary | ICD-10-CM | POA: Diagnosis not present

## 2015-09-11 DIAGNOSIS — R1310 Dysphagia, unspecified: Secondary | ICD-10-CM

## 2015-09-11 DIAGNOSIS — Z91010 Allergy to peanuts: Secondary | ICD-10-CM | POA: Diagnosis not present

## 2015-09-11 DIAGNOSIS — J45909 Unspecified asthma, uncomplicated: Secondary | ICD-10-CM | POA: Diagnosis not present

## 2015-09-11 DIAGNOSIS — K222 Esophageal obstruction: Secondary | ICD-10-CM | POA: Diagnosis present

## 2015-09-11 DIAGNOSIS — Z882 Allergy status to sulfonamides status: Secondary | ICD-10-CM | POA: Insufficient documentation

## 2015-09-11 DIAGNOSIS — B1910 Unspecified viral hepatitis B without hepatic coma: Secondary | ICD-10-CM | POA: Diagnosis not present

## 2015-09-11 DIAGNOSIS — K449 Diaphragmatic hernia without obstruction or gangrene: Secondary | ICD-10-CM | POA: Insufficient documentation

## 2015-09-11 HISTORY — DX: Gastro-esophageal reflux disease without esophagitis: K21.9

## 2015-09-11 HISTORY — DX: Personal history of other medical treatment: Z92.89

## 2015-09-11 HISTORY — DX: Bipolar disorder, unspecified: F31.9

## 2015-09-11 HISTORY — PX: SAVORY DILATION: SHX5439

## 2015-09-11 HISTORY — DX: Reserved for inherently not codable concepts without codable children: IMO0001

## 2015-09-11 HISTORY — DX: Anxiety disorder, unspecified: F41.9

## 2015-09-11 HISTORY — PX: ESOPHAGOGASTRODUODENOSCOPY (EGD) WITH PROPOFOL: SHX5813

## 2015-09-11 SURGERY — ESOPHAGOGASTRODUODENOSCOPY (EGD) WITH PROPOFOL
Anesthesia: Monitor Anesthesia Care

## 2015-09-11 MED ORDER — LACTATED RINGERS IV SOLN
Freq: Once | INTRAVENOUS | Status: AC
  Administered 2015-09-11: 1000 mL via INTRAVENOUS

## 2015-09-11 MED ORDER — LACTATED RINGERS IV SOLN
INTRAVENOUS | Status: DC | PRN
Start: 2015-09-11 — End: 2015-09-11
  Administered 2015-09-11: 08:00:00 via INTRAVENOUS

## 2015-09-11 MED ORDER — ONDANSETRON HCL 4 MG/2ML IJ SOLN: 4.0000 mg | Freq: Once | INTRAMUSCULAR | Status: DC | PRN

## 2015-09-11 MED ORDER — MIDAZOLAM HCL 5 MG/5ML IJ SOLN
INTRAMUSCULAR | Status: DC | PRN
  Administered 2015-09-11: 1 mg via INTRAVENOUS

## 2015-09-11 MED ORDER — BUTAMBEN-TETRACAINE-BENZOCAINE 2-2-14 % EX AERO
INHALATION_SPRAY | CUTANEOUS | Status: DC | PRN
  Administered 2015-09-11: 2 via TOPICAL

## 2015-09-11 MED ORDER — PROPOFOL 500 MG/50ML IV EMUL
INTRAVENOUS | Status: DC | PRN
  Administered 2015-09-11: 100 ug/kg/min via INTRAVENOUS

## 2015-09-11 MED ORDER — HYDROMORPHONE HCL 1 MG/ML IJ SOLN: 0.2500 mg | INTRAMUSCULAR | Status: DC | PRN

## 2015-09-11 MED ORDER — MEPERIDINE HCL 100 MG/ML IJ SOLN: 6.2500 mg | INTRAMUSCULAR | Status: DC | PRN

## 2015-09-11 MED ORDER — LIDOCAINE HCL (CARDIAC) 20 MG/ML IV SOLN
INTRAVENOUS | Status: DC | PRN
  Administered 2015-09-11: 100 mg via INTRAVENOUS

## 2015-09-11 MED ORDER — PROPOFOL 10 MG/ML IV BOLUS
INTRAVENOUS | Status: DC | PRN
  Administered 2015-09-11: 10 mg via INTRAVENOUS
  Administered 2015-09-11 (×2): 20 mg via INTRAVENOUS
  Administered 2015-09-11: 10 mg via INTRAVENOUS
  Administered 2015-09-11: 20 mg via INTRAVENOUS
  Administered 2015-09-11: 10 mg via INTRAVENOUS

## 2015-09-11 NOTE — Anesthesia Postprocedure Evaluation (Signed)
Anesthesia Post Note  Patient: [name]  Procedure(s) Performed: Procedure(s) (LRB): ESOPHAGOGASTRODUODENOSCOPY (EGD) WITH PROPOFOL (N/A) SAVORY DILATION (N/A)  Anesthesia type: general  Patient location: PACU  Post pain: Pain level controlled  Post assessment: Patient's Cardiovascular Status Stable  Last Vitals:  Filed Vitals:   09/11/15 0920  BP: 158/92  Pulse: 60  Temp:   Resp: 15    Post vital signs: Reviewed and stable  Level of consciousness: sedated  Complications: No apparent anesthesia complications

## 2015-09-11 NOTE — H&P (Signed)
  Date of Initial H&P: 09/07/15  History reviewed, patient examined, no change in status, stable for surgery.

## 2015-09-11 NOTE — Anesthesia Procedure Notes (Signed)
Procedure Name: MAC Date/Time: 09/11/2015 8:15 AM Performed by: Barrington Ellison Pre-anesthesia Checklist: Patient identified, Emergency Drugs available, Suction available, Patient being monitored and Timeout performed Patient Re-evaluated:Patient Re-evaluated prior to inductionOxygen Delivery Method: Nasal cannula

## 2015-09-11 NOTE — Op Note (Signed)
[facility] [place], [address]   ENDOSCOPY PROCEDURE REPORT  PATIENT: [name], [name]  MR#: [id number] BIRTHDATE: Mar 22, 1976 , 21  yrs. old GENDER: female ENDOSCOPIST: Wilford Corner, MD REFERRED BY: PROCEDURE DATE:  10-05-15 PROCEDURE:  EGD with Savary dilation of esophagus using fluoroscopy ASA CLASS:     Class III INDICATIONS:  dysphagia; esophageal stricture MEDICATIONS: Monitored anesthesia care and Per Anesthesia TOPICAL ANESTHETIC:  DESCRIPTION OF PROCEDURE: After the risks benefits and alternatives of the procedure were thoroughly explained, informed consent was obtained.  The Pentax Gastroscope [id number] endoscope was introduced through the mouth and advanced to the second portion of the duodenum , Without limitations.  The instrument was slowly withdrawn as the mucosa was fully examined. Estimated blood loss is zero unless otherwise noted in this procedure report.    Large amount of white plaques seen in proximal esophagus. Tight mid-esophageal stricture noted that was smaller than the 9.8 mm endoscope. Fluoroscopy used to pass the spring-tipped guidewire into the stomach and in sequential fashion Savary dilators were passed starting with an 8 mm (no heme, no resistance), 9 mm (no heme, no resistance), 10 mm (minimal heme, minimal resistance), 11 mm (minimal heme, min resistance), 12.8 mm (minimal heme, min resistance), 14 mm (heme, moderate resistance). Endoscope was reinserted showing successful dilation of the stricture and passage of the endoscope easily into the distal esophagus. GEJ at 42 cm from the incisors and normal in appearance. Stomach normal. Duodenal bulb and 2nd portion of the duodenum normal. Retroflexed views revealed a small hiatal hernia. Cytologic brushing done proximal to strictured area to check for Candida. The scope was then withdrawn from the patient and the  procedure completed.  COMPLICATIONS: There were no immediate complications.  ENDOSCOPIC IMPRESSION:     Tight mid-esophageal stricture -s/p Savary dilation to 14 mm Small hiatal hernia Question of Candida esophagitis -  s/p brushing  RECOMMENDATIONS:   eSigned:  Wilford Corner, MD 10-05-15 9:07 AM    CC:  CPT CODES: ICD CODES:  The ICD and CPT codes recommended by this software are interpretations from the data that the clinical staff has captured with the software.  The verification of the translation of this report to the ICD and CPT codes and modifiers is the sole responsibility of the health care institution and practicing physician where this report was generated.  [employer] will not be held responsible for the validity of the ICD and CPT codes included on this report.  AMA assumes no liability for data contained or not contained herein. CPT is a Designer, television/film set of the [employer].  PATIENT NAME:  [name], [name] MR#: [id number]

## 2015-09-11 NOTE — Discharge Instructions (Signed)
Esophagogastroduodenoscopy °Care After °Refer to this sheet in the next few weeks. These instructions provide you with information on caring for yourself after your procedure. Your caregiver may also give you more specific instructions. Your treatment has been planned according to current medical practices, but problems sometimes occur. Call your caregiver if you have any problems or questions after your procedure.  °HOME CARE INSTRUCTIONS °· Do not eat or drink anything until the numbing medicine (local anesthetic) has worn off and your gag reflex has returned. You will know that the local anesthetic has worn off when you can swallow comfortably. °· Do not drive for 12 hours after the procedure or as directed by your caregiver. °· Only take medicines as directed by your caregiver. °SEEK MEDICAL CARE IF:  °· You cannot stop coughing. °· You are not urinating at all or less than usual. °SEEK IMMEDIATE MEDICAL CARE IF: °· You have difficulty swallowing. °· You cannot eat or drink. °· You have worsening throat or chest pain. °· You have dizziness, lightheadedness, or you faint. °· You have nausea or vomiting. °· You have chills. °· You have a fever. °· You have severe abdominal pain. °· You have black, tarry, or bloody stools. °Document Released: 11/18/2012 Document Reviewed: 11/18/2012 °ExitCare® Patient Information ©2015 [employer]. This information is not intended to replace advice given to you by your health care provider. Make sure you discuss any questions you have with your health care provider. ° °

## 2015-09-11 NOTE — Transfer of Care (Signed)
Immediate Anesthesia Transfer of Care Note  Patient: [name]  Procedure(s) Performed: Procedure(s): ESOPHAGOGASTRODUODENOSCOPY (EGD) WITH PROPOFOL (N/A) SAVORY DILATION (N/A)  Patient Location: Endoscopy Unit  Anesthesia Type:MAC  Level of Consciousness: awake  Airway & Oxygen Therapy: Patient Spontanous Breathing  Post-op Assessment: Report given to RN  Post vital signs: Reviewed and stable  Last Vitals:  Filed Vitals:   09/11/15 0703  BP: 126/86  Temp: 36.5 C  Resp: 13    Complications: No apparent anesthesia complications

## 2015-09-11 NOTE — Interval H&P Note (Signed)
History and Physical Interval Note:  09/11/2015 8:07 AM  [name]  has presented today for surgery, with the diagnosis of esoph. stricture  The various methods of treatment have been discussed with the patient and family. After consideration of risks, benefits and other options for treatment, the patient has consented to  Procedure(s): ESOPHAGOGASTRODUODENOSCOPY (EGD) WITH PROPOFOL (N/A) SAVORY DILATION (N/A) as a surgical intervention .  The patient's history has been reviewed, patient examined, no change in status, stable for surgery.  I have reviewed the patient's chart and labs.  Questions were answered to the patient's satisfaction.     Hudson C.

## 2015-09-11 NOTE — Anesthesia Preprocedure Evaluation (Addendum)
Anesthesia Evaluation  Patient identified by MRN, date of birth, ID band Patient awake    Reviewed: Allergy & Precautions, NPO status , Patient's Chart, lab work & pertinent test results  Airway Mallampati: I  TM Distance: >3 FB Neck ROM: Full    Dental  (+) Teeth Intact, Dental Advisory Given   Pulmonary asthma ,    Pulmonary exam normal        Cardiovascular Normal cardiovascular exam Rhythm:Regular     Neuro/Psych    GI/Hepatic PUD, GERD  Medicated and Controlled,(+) Hepatitis -, B  Endo/Other    Renal/GU      Musculoskeletal   Abdominal   Peds  Hematology   Anesthesia Other Findings   Reproductive/Obstetrics                            Anesthesia Physical Anesthesia Plan  ASA: III  Anesthesia Plan: MAC   Post-op Pain Management:    Induction: Intravenous  Airway Management Planned:   Additional Equipment:   Intra-op Plan:   Post-operative Plan:   Informed Consent: I have reviewed the patients History and Physical, chart, labs and discussed the procedure including the risks, benefits and alternatives for the proposed anesthesia with the patient or authorized representative who has indicated his/her understanding and acceptance.     Plan Discussed with: CRNA and Surgeon  Anesthesia Plan Comments:         Anesthesia Quick Evaluation

## 2015-09-12 ENCOUNTER — Encounter (HOSPITAL_COMMUNITY): Payer: Self-pay | Admitting: Gastroenterology

## 2015-10-13 ENCOUNTER — Ambulatory Visit: Payer: Self-pay | Admitting: Allergy and Immunology

## 2015-11-16 ENCOUNTER — Ambulatory Visit (INDEPENDENT_AMBULATORY_CARE_PROVIDER_SITE_OTHER): Payer: Medicaid Other | Admitting: Allergy and Immunology

## 2015-11-16 ENCOUNTER — Encounter: Payer: Self-pay | Admitting: Allergy and Immunology

## 2015-11-16 VITALS — BP 114/64 | HR 98 | Temp 99.8°F | Resp 20 | Ht 70.47 in | Wt 157.2 lb

## 2015-11-16 DIAGNOSIS — R0602 Shortness of breath: Secondary | ICD-10-CM

## 2015-11-16 DIAGNOSIS — R059 Cough, unspecified: Secondary | ICD-10-CM

## 2015-11-16 DIAGNOSIS — R062 Wheezing: Secondary | ICD-10-CM

## 2015-11-16 DIAGNOSIS — R05 Cough: Secondary | ICD-10-CM

## 2015-11-16 MED ORDER — CETIRIZINE HCL 10 MG PO TABS: 10.0000 mg | ORAL_TABLET | Freq: Every day | ORAL | Status: DC

## 2015-11-16 MED ORDER — IPRATROPIUM BROMIDE 0.02 % IN SOLN
0.5000 mg | Freq: Once | RESPIRATORY_TRACT | Status: AC
  Administered 2015-11-16: 0.5 mg via RESPIRATORY_TRACT

## 2015-11-16 MED ORDER — BECLOMETHASONE DIPROPIONATE 80 MCG/ACT IN AERS: 2.0000 | INHALATION_SPRAY | Freq: Two times a day (BID) | RESPIRATORY_TRACT | Status: DC

## 2015-11-16 MED ORDER — LEVALBUTEROL HCL 1.25 MG/3ML IN NEBU
1.2500 mg | INHALATION_SOLUTION | Freq: Once | RESPIRATORY_TRACT | Status: AC
  Administered 2015-11-16: 1.25 mg via RESPIRATORY_TRACT

## 2015-11-16 MED ORDER — ALBUTEROL SULFATE HFA 108 (90 BASE) MCG/ACT IN AERS: 2.0000 | INHALATION_SPRAY | RESPIRATORY_TRACT | Status: AC | PRN

## 2015-11-16 NOTE — Patient Instructions (Addendum)
Take Home Sheet  1. Avoidance: of significant temperature changes and irritant exposures.      2. Antihistamine: Zyrtec 10mg  by mouth once daily for runny nose or itching.   3. Nasal Spray: Saline 2 spray(s) each nostril twice daily for stuffy nose or drainage.     4. Inhalers: with spacer  Rescue: ProAir HFA 2 puffs every 4 hours as needed for cough or wheeze.       -May use 2 puffs 10-20 minutes prior to exercise.   Preventative: QVAR 63mcg 2 puffs twice daily (Rinse, gargle, and spit out after use).  5. Avoidance of concerning foods as previously.  If new episodes of itching or skin changes take picture and document environment/exposure/activity and ingestion.  6. Follow up Visit:  For skin testing off antihistamines 72 hours prior to appointment.   Websites that have reliable Patient information: 1. American Academy of Asthma, Allergy, & Immunology: www.aaaai.org 2. Food Allergy Network: www.foodallergy.org 3. Mothers of Asthmatics: www.aanma.org 4. [employer]: DiningCalendar.de 5. American College of Allergy, Asthma, & Immunology: https://robertson.info/ or www.acaai.org

## 2015-11-16 NOTE — Progress Notes (Signed)
NEW PATIENT NOTE  RE: [name] MRN: [id number] DOB: 1976/07/02 [facility] [place] [address] Date of Office Visit: 11/16/2015  Referring provider: Nolene Ebbs, MD [address] [place], [place] [address]  Subjective:  [name] is a 39 y.o. female who presents today for Asthma; Breathing Problem; Cough; and Wheezing  Assessment:   1. Previous diagnosis of asthma, intermittent symptoms, clear lung exam and excellent in office spirometry.  2. Patient report of hives, concern for food allergy. (2010)  3. Chronic rhinoconjunctivitis suspected allergic etiology.  4.      Complex medical history, on multiple medication regime as followed by Dr. Megan Salon, Infectious Disease. Plan:   Meds ordered this encounter  Medications  . ipratropium (ATROVENT) nebulizer solution 0.5 mg    Sig:   . levalbuterol (XOPENEX) nebulizer solution 1.25 mg    Sig:   . DISCONTD: beclomethasone (QVAR) 80 MCG/ACT inhaler    Sig: Inhale 2 puffs into the lungs 2 (two) times daily.    Dispense:  1 Inhaler    Refill:  2  . albuterol (PROAIR HFA) 108 (90 BASE) MCG/ACT inhaler    Sig: Inhale 2 puffs into the lungs every 4 (four) hours as needed for wheezing or shortness of breath (cough).    Dispense:  1 Inhaler    Refill:  2  . cetirizine (ZYRTEC) 10 MG tablet    Sig: Take 1 tablet (10 mg total) by mouth daily.    Dispense:  34 tablet    Refill:  5   Patient Instructions  1. Avoidance: of significant temperature changes and irritant exposures. 2. Antihistamine: Zyrtec 10mg  by mouth once daily for runny nose or itching. 3. Nasal Spray: Saline 2 spray(s) each nostril twice daily for stuffy nose or drainage.  4. Inhalers: with spacer  Rescue: ProAir HFA 2 puffs every 4 hours as needed for cough or wheeze.       -May use 2 puffs 10-20 minutes prior to exercise.  Preventative: Begin QVAR 66mcg 2 puffs  twice daily (Rinse, gargle, and spit out after use). 5. Avoidance of concerning foods as previously.  If new episodes of itching or skin changes take picture and document environment/exposure/activity and ingestion. 6. Follow up Visit:  For skin testing off antihistamines 72 hours prior to appointment.  HPI: [name] presents to the office with rhinorrhea, congestion, sneezing, itchy watery eyes and postnasal drip, intermittently associated with lower respiratory symptoms.  She describes a diagnosis of asthma 10 years ago and increasing symptoms recently, in particular cough (previous wheeze and shortness of breath) with use of albuterol a few times a day where pollen, dust, outdoors, strong odors/perfumes and fluctuant weather patterns appear to be provoking factors for her symptoms.  She has had systemic steroids with exercise and nocturnal induced difficulty.  In addition she is concerned about food allergies given episodes of hives in the last 6 years.  She describes generalized itching associated with throat irritation on a few occasions possibly shrimp or strawberries and one episode of generalized hives possibly after chocolate candy bar/Nuts versus Twix bar.  None of the episodes provoked lip, tongue or throat swelling/vomiting/diarrhea/dysphagia/shortness of breath or difficulty in breathing.  She has managed with Benadryl with improvement but has received care in the ED where she received cortisone injection and the possibility of new sheets or detergent as trigger was reviewed.  No Asthma hospitalizations.  Uses Tone soap, Tide and  Bath and Body cashmere lotion.  Medical History: Past Medical History  Diagnosis Date  . HIV positive ([place])   . Hepatitis B     /E-chart  . Microcytic anemia     h/o per E-chart  . 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 Noncompliance with medication regimen     /e-chart  . Acute psychosis 03/10/12    2nd  admission in last wk for this  . Thyroid disease   . Depression   . Asthma     inhaler 2xday  . Shortness of breath dyspnea     due to Asthma  . Anxiety   . Bipolar disorder ([place])   . Pyelonephritis     h/o per E-chart  . GERD (gastroesophageal reflux disease)   . History of blood transfusion   . Angio-edema   . Urticaria    Surgical History: Past Surgical History  Procedure Laterality Date  . Thyroid surgery    . Thyroid lobectomy  08/2002    left & isthmectomy; for benign thyroid adenoma/E-chart  . Tubal ligation  07/2002    /E-chart  . Esophagogastroduodenoscopy N/A 07/13/2013    Procedure: ESOPHAGOGASTRODUODENOSCOPY (EGD);  Surgeon: Beryle Beams, MD;  Location: [facility] ENDOSCOPY;  Service: Endoscopy;  Laterality: N/A;  . Esophagogastroduodenoscopy N/A 08/12/2014    Procedure: ESOPHAGOGASTRODUODENOSCOPY (EGD);  Surgeon: Milus Banister, MD;  Location: [facility] ENDOSCOPY;  Service: Endoscopy;  Laterality: N/A;  . Esophagogastroduodenoscopy N/A 02/14/2015    Procedure: ESOPHAGOGASTRODUODENOSCOPY (EGD);  Surgeon: Lear Ng, MD;  Location: [facility] ENDOSCOPY;  Service: Endoscopy;  Laterality: N/A;  . Balloon dilation N/A 02/14/2015    Procedure: BALLOON DILATION;  Surgeon: Lear Ng, MD;  Location[facility] ENDOSCOPY;  Service: Endoscopy;  Laterality: N/A;  . Savory dilation N/A 02/14/2015    Procedure: SAVORY DILATION;  Surgeon: Lear Ng, MD;  Location: [facility] ENDOSCOPY;  Service: Endoscopy;  Laterality: N/A;  no xray needed  . Esophagogastroduodenoscopy (egd) with propofol N/A 04/11/2015    Procedure: ESOPHAGOGASTRODUODENOSCOPY (EGD) WITH PROPOFOL;  Surgeon: Wilford Corner, MD;  Location: [facility] ENDOSCOPY;  Service: Endoscopy;  Laterality: N/A;  . Savory dilation N/A 04/11/2015    Procedure: SAVORY DILATION;  Surgeon: Wilford Corner, MD;  Location: [facility] ENDOSCOPY;  Service: Endoscopy;  Laterality: N/A;  . Esophagogastroduodenoscopy (egd) with propofol N/A 09/11/2015    Procedure:  ESOPHAGOGASTRODUODENOSCOPY (EGD) WITH PROPOFOL;  Surgeon: Wilford Corner, MD;  Location[facility] ENDOSCOPY;  Service: Endoscopy;  Laterality: N/A;  . Savory dilation N/A 09/11/2015    Procedure: SAVORY DILATION;  Surgeon: Wilford Corner, MD;  Location: [facility] ENDOSCOPY;  Service: Endoscopy;  Laterality: N/A;   Family History: Family History  Problem Relation Age of Onset  . Cancer Mother   . Diabetes Mother   . Diabetes Father   . Heart disease Father   . Diabetes Sister   . Urticaria Sister   . Asthma Son   . Allergic rhinitis Neg Hx   . Eczema Neg Hx   . Immunodeficiency Neg Hx    Social History: Social History  . Marital Status: Single    Spouse Name: N/A  . Number of Children: N/A  . Years of Education: N/A   Social History Main Topics  . Smoking status: Never Smoker   . Smokeless tobacco: Never Used  . Alcohol Use: 1.2 oz/week    2 Glasses of wine, 0 Standard drinks or equivalent per week     Comment: occ  . Drug Use: No  .  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  Medications prior to this encounter: Outpatient Prescriptions Prior to Visit  Medication Sig Dispense Refill  . acyclovir (ZOVIRAX) 200 MG/5ML suspension Take 400 mg by mouth See admin instructions. Reported on 12/20/2015  4  . atovaquone (MEPRON) 750 MG/5ML suspension Take 10 mLs (1,500 mg total) by mouth daily. 210 mL 0  . benztropine (COGENTIN) 1 MG tablet Take 1 tablet (1 mg total) by mouth daily. 30 tablet 1  . diphenhydrAMINE (BENADRYL) 25 mg capsule Take 25 mg by mouth at bedtime. Reported on 12/20/2015    . elvitegravir-cobicistat-emtricitabine-tenofovir (GENVOYA) 150-150-200-10 MG TABS tablet Take 1 tablet by mouth daily with breakfast. 30 tablet 3  . HYDROmorphone (DILAUDID) 4 MG tablet Take 4 mg by mouth every 4 (four) hours as needed for severe pain. Reported on  12/20/2015    . Multiple Vitamins-Minerals (ADULT ONE DAILY GUMMIES) CHEW Chew 2 tablets by mouth daily.    Marland Kitchen OLANZapine (ZYPREXA) 5 MG tablet TAKE 1 TABLET BY MOUTH AT BEDTIME 30 tablet 5  . pantoprazole (PROTONIX) 40 MG tablet Take 1 tablet (40 mg total) by mouth 2 (two) times daily. 60 tablet 0  . VOLTAREN 1 % GEL Apply 1 application topically 3 (three) times daily as needed. Reported on 12/20/2015  2  . albuterol (PROVENTIL HFA;VENTOLIN HFA) 108 (90 BASE) MCG/ACT inhaler Inhale 2 puffs into the lungs every 6 (six) hours as needed for wheezing. (Patient taking differently: Inhale 2 puffs into the lungs 2 (two) times daily. ) 1 Inhaler 5  . fluconazole (DIFLUCAN) 100 MG tablet Take 1 tablet (100 mg total) by mouth daily. (Patient not taking: Reported on 12/20/2015) 10 tablet 1   No facility-administered medications prior to visit.   Drug Allergies: Allergies  Allergen Reactions  . Other Hives and Itching    Berries, TREE NUTS  . Bactrim Itching  . Orange Fruit Itching  . Peanut-Containing Drug Products Hives  . Shellfish Allergy Hives  . Sulfa Antibiotics Itching  . Dapsone Itching and Rash   Environmental History: [name] lives in a 38 year old house for one month with an indoor dog, sleeping on a stuffed mattress with non-feather pillow and comforter.  Central air and heat without humidifier or smokers.    Review of Systems  Constitutional: Negative for fever, weight loss and malaise/fatigue.  HENT: Positive for congestion. Negative for ear pain, hearing loss, nosebleeds and sore throat.   Eyes: Negative for discharge and redness.  Respiratory: Negative for shortness of breath.        Previous history of bronchitis and pneumonia.  Gastrointestinal: Positive for heartburn (specific foods--fried foods and tomato). Negative for nausea, vomiting, abdominal pain, diarrhea and constipation.  Genitourinary: Negative.   Musculoskeletal: Negative for myalgias and joint pain.  Skin: Positive for  itching.  Neurological: Negative for dizziness, seizures, weakness and headaches.       Rare headaches.  Endo/Heme/Allergies: Positive for environmental allergies.       Avoids costume jewelry due to itching.  Denies sensitivity to aspirin, NSAIDs, stinging insects, latex and cosmetics.  Psychiatric/Behavioral: Negative for memory loss.  Immuno:  Secondary immune deficiency-- per patient 2003.  Objective:   Filed Vitals:   11/16/15 1346  BP: 114/64  Pulse: 98  Temp: 99.8 F (37.7 C)  Resp: 20   Physical Exam  Constitutional: She is well-developed, well-nourished,  and in no distress.  HENT:  Head: Atraumatic.  Right Ear: Tympanic membrane and ear canal normal.  Left Ear: Tympanic membrane and ear canal normal.  Nose: Mucosal edema present. No rhinorrhea. No epistaxis.  Mouth/Throat: Oropharynx is clear and moist and mucous membranes are normal. No oropharyngeal exudate, posterior oropharyngeal edema or posterior oropharyngeal erythema.  Eyes: Conjunctivae are normal.  Neck: Neck supple.  Cardiovascular: Normal rate, S1 normal and S2 normal.   No murmur heard. Pulmonary/Chest: Effort normal. She has no wheezes. She has no rhonchi. She has no rales.  Abdominal: Soft. Normal appearance and bowel sounds are normal.  Musculoskeletal: She exhibits no edema.  Lymphadenopathy:    She has no cervical adenopathy.  Neurological: She is alert.  Skin: Skin is warm and intact. No rash noted. No cyanosis. Nails show no clubbing.    Diagnostics: Spirometry:  FVC 3.73--97%,  FEV1 3.04--97%; postbronchodilator FVC  3.28--86%, FEV1  2.88--91%.  Skin testing deferred.    Roselyn M. Ishmael Holter, MD   cc: Nolene Ebbs, MD

## 2015-12-20 ENCOUNTER — Encounter: Payer: Self-pay | Admitting: Allergy and Immunology

## 2015-12-20 ENCOUNTER — Ambulatory Visit (INDEPENDENT_AMBULATORY_CARE_PROVIDER_SITE_OTHER): Payer: Medicaid Other | Admitting: Allergy and Immunology

## 2015-12-20 VITALS — BP 112/68 | HR 70 | Temp 98.1°F | Resp 18

## 2015-12-20 DIAGNOSIS — J309 Allergic rhinitis, unspecified: Secondary | ICD-10-CM | POA: Diagnosis not present

## 2015-12-20 DIAGNOSIS — L509 Urticaria, unspecified: Secondary | ICD-10-CM | POA: Diagnosis not present

## 2015-12-20 DIAGNOSIS — H1010 Acute atopic conjunctivitis, unspecified eye: Secondary | ICD-10-CM

## 2015-12-20 DIAGNOSIS — Z91018 Allergy to other foods: Secondary | ICD-10-CM | POA: Diagnosis not present

## 2015-12-20 DIAGNOSIS — J4530 Mild persistent asthma, uncomplicated: Secondary | ICD-10-CM | POA: Diagnosis not present

## 2015-12-20 MED ORDER — ALBUTEROL SULFATE HFA 108 (90 BASE) MCG/ACT IN AERS: 2.0000 | INHALATION_SPRAY | RESPIRATORY_TRACT | Status: DC | PRN

## 2015-12-20 MED ORDER — EPINEPHRINE 0.3 MG/0.3ML IJ SOAJ: 0.3000 mg | Freq: Once | INTRAMUSCULAR | Status: DC

## 2015-12-20 MED ORDER — BECLOMETHASONE DIPROPIONATE 80 MCG/ACT IN AERS: 2.0000 | INHALATION_SPRAY | Freq: Every day | RESPIRATORY_TRACT | Status: DC

## 2015-12-20 NOTE — Patient Instructions (Addendum)
Take Home Sheet  1. Avoidance: Mite and Pollen and all tree nuts and foods as previously.     EpiPen/Benadryl as needed  2. Antihistamine: Zyrtec 10mg  by mouth once daily for runny nose or itching.   3. Nasal Spray: Rhinocort 1-2 spray(s) each nostril once daily for stuffy nose or drainage.      Stop Nasacort  4. Inhalers:  Rescue: ProAir 2 puffs every 4 hours as needed for cough or wheeze.       -May use 2 puffs 10-20 minutes prior to exercise.   Preventative: QVAR 2 puffs once daily (Rinse, gargle, and spit out after use).    5. Follow up Visit: 6 months or sooner if needed.          Consider selected labs at [facility].  Websites that have reliable Patient information: 1. American Academy of Asthma, Allergy, & Immunology: www.aaaai.org 2. Food Allergy Network: www.foodallergy.org 3. Mothers of Asthmatics: www.aanma.org 4. [place]: DiningCalendar.de 5. American College of Allergy, Asthma, & Immunology: https://robertson.info/ or www.acaai.org

## 2015-12-20 NOTE — Progress Notes (Signed)
FOLLOW UP NOTE  RE: ZYMIRAH TRUPP MRN: [id number] DOB: 1976-09-24 [facility] [place] [address] [place] [address] Date of Office Visit: 12/20/2015  Subjective:  [name] is a 40 y.o. female who presents today for Allergy Testing  Assessment:   1. Mild persistent asthma, improved control.  2. Allergic rhinoconjunctivitis.   3. Hives, currently asymptomatic.   4. 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 (Jan 2017).   5.      Complex medical history on multiple medication regime. Plan:   Meds ordered this encounter  Medications  . albuterol (PROAIR HFA) 108 (90 Base) MCG/ACT inhaler    Sig: Inhale 2 puffs into the lungs every 4 (four) hours as needed for wheezing or shortness of breath.    Dispense:  1 Inhaler    Refill:  1  . beclomethasone (QVAR) 80 MCG/ACT inhaler    Sig: Inhale 2 puffs into the lungs daily.    Dispense:  1 Inhaler    Refill:  3  . EPINEPHrine 0.3 mg/0.3 mL IJ SOAJ injection    Sig: Inject 0.3 mLs (0.3 mg total) into the muscle once.    Dispense:  2 Device    Refill:  1   Patient Instructions  1. Avoidance: Mite and Pollen and all tree nuts/foods as previously.    --- EpiPen/Benadryl as needed 2. Antihistamine: Zyrtec 10mg  by mouth once daily for runny nose or itching. 3. Nasal Spray: Rhinocort 1-2 spray(s) each nostril once daily for stuffy nose or drainage. (stop Nasacort). 4. Inhalers:  Rescue: ProAir 2 puffs every 4 hours as needed for cough or wheeze.       -May use 2 puffs 10-20 minutes prior to exercise.  Preventative: QVAR 52mcg 2 puffs once daily (Rinse, gargle, and spit out after use). 5. Follow up Visit: 6 months or sooner if needed.          Consider selected labs at [facility].  HPI: [name] returns to the office off anti-histamines for selected aeroallergen and food testing.  She reports feeling very good where medications have been beneficial, though noted  intermittent nasal tingling with the Nasacort.  She feels her sleep and activity are good and has not needed any ProAir.  She wonders about sensitivity to nuts, oranges and melons, given previous history of hives and throat irritation.  Denies ED or urgent care visits, prednisone or antibiotic courses.  No new skin concerns.  Current Medications: 1.  Qvar 80 g 2 puffs once daily. 2.  Nasacort 1-2 sprays once daily. 3.  Zyrtec 10 mg daily, rinse (recently off). 4.  Pro Air HFA as needed. 5.  Continues daily multivitamin, Mepron, Cogentin, Genvoya, Zyprexa, Zovirax and as needed Dilaudid.  Drug Allergies: Allergies  Allergen Reactions  . Other Hives and Itching    Berries, TREE NUTS  . Bactrim Itching  . Orange Fruit Itching  . Peanut-Containing Drug Products Hives  . Shellfish Allergy Hives  . Sulfa Antibiotics Itching  . Dapsone Itching and Rash   Objective:   Filed Vitals:   12/20/15 1407  BP: 112/68  Pulse: 70  Temp: 98.1 F (36.7 C)  Resp: 18   Physical Exam  Constitutional: She is well-developed, well-nourished, and in no distress.  HENT:  Head: Atraumatic.  Right Ear: Tympanic membrane and ear canal normal.  Left Ear: Tympanic membrane and ear canal normal.  Nose: Mucosal edema (minimal.) present. No rhinorrhea. No epistaxis.  Mouth/Throat: Oropharynx is clear and moist and mucous membranes are normal. No oropharyngeal exudate, posterior oropharyngeal edema or posterior oropharyngeal erythema.  Neck: Neck supple.  Cardiovascular: Normal rate, S1 normal and S2 normal.   No murmur heard. Pulmonary/Chest: Effort normal. She has no wheezes. She has no rhonchi. She has no rales.  Lymphadenopathy:    She has no cervical adenopathy.   Diagnostics: Spirometry:  FVC  3.40--99%, FEV1 2.79--99%.  Skin testing:  Mild reactivity to selected grass pollens, ragweed pollen mix and dust mite mix, minimal reactivity to cashew and otherwise negative including selected  foods.    Roselyn M. Ishmael Holter, MD  cc: Philis Fendt, MD

## 2016-02-26 ENCOUNTER — Telehealth: Payer: Self-pay | Admitting: *Deleted

## 2016-02-26 NOTE — Telephone Encounter (Signed)
RN reviewed the patient's chart and noted she has been out of care for a while with Dr Megan Salon. In a attempt to reconnect with the patient and get her back into care RN contacted the patient. Message left with the patient stating "This is Jasaun Carn(Nurse) and I wanted you to know that I am thinking of you" RN offered to help in anyway as well. Intent of communication again is to get the back in care with Dr Megan Salon.

## 2016-02-26 NOTE — Telephone Encounter (Signed)
Ambre,  Thanks for reaching out to her. She desparatly needs to be in care and on meds.  Jenny Reichmann

## 2016-06-26 ENCOUNTER — Ambulatory Visit (INDEPENDENT_AMBULATORY_CARE_PROVIDER_SITE_OTHER): Payer: Medicaid Other | Admitting: Allergy and Immunology

## 2016-06-26 ENCOUNTER — Encounter: Payer: Self-pay | Admitting: Allergy and Immunology

## 2016-06-26 VITALS — BP 108/80 | HR 89 | Temp 98.5°F | Resp 18

## 2016-06-26 DIAGNOSIS — L299 Pruritus, unspecified: Secondary | ICD-10-CM

## 2016-06-26 DIAGNOSIS — R062 Wheezing: Secondary | ICD-10-CM

## 2016-06-26 DIAGNOSIS — R059 Cough, unspecified: Secondary | ICD-10-CM

## 2016-06-26 DIAGNOSIS — L509 Urticaria, unspecified: Secondary | ICD-10-CM

## 2016-06-26 DIAGNOSIS — R05 Cough: Secondary | ICD-10-CM

## 2016-06-26 MED ORDER — BECLOMETHASONE DIPROPIONATE 80 MCG/ACT IN AERS: 2.0000 | INHALATION_SPRAY | Freq: Every day | RESPIRATORY_TRACT | Status: DC

## 2016-06-26 MED ORDER — CETIRIZINE HCL 10 MG PO TABS: 10.0000 mg | ORAL_TABLET | Freq: Every day | ORAL | Status: DC

## 2016-06-26 NOTE — Progress Notes (Signed)
FOLLOW UP NOTE  RE: [name] MRN: [id number] DOB: Jul 25, 1976 [facility] [place] [address] [place] [address] Date of Office Visit: 06/26/2016  Subjective:  [name] is a 40 y.o. female who presents today for Asthma  Assessment:   1. Asthma, mild persistent, with recent intermittent cough, afebrile in no respiratory distress with normal lung exam and in office spirometry.   2. Allergic rhinoconjunctivitis.   3. 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    4. Complex medical regime, secondary immunodeficiency pending anticipated follow-up.     Plan:   Meds ordered this encounter  Medications  . beclomethasone (QVAR) 80 MCG/ACT inhaler    Sig: Inhale 2 puffs into the lungs daily.    Dispense:  1 Inhaler    Refill:  3  . cetirizine (ZYRTEC) 10 MG tablet    Sig: Take 1 tablet (10 mg total) by mouth daily.    Dispense:  34 tablet    Refill:  5  1.   Obtain labs at [facility] as discussed. 2.   Keep written diary of any further skin change episodes. 3.   Avoid fragranced soaps/lotions/detergents. 4.   Moisturize with Vanicream or Cerave. 5.   For the next 2 weeks use QVAR 2 puffs twice daily, then decrease to each am only as long as symptom free. 6.   ProAir 2 puffs every 4 hours as needed--call with any recurring use. 7.   Continue Zyrtec and Rhinocort daily. 8.   Use Saline nasal wash each evening at shower time. 9.   Keep follow-up with Dr. Megan Salon, reviewed importance of consistent follow-up and maintenance of medications per his office. 10.  Call office with up-to-date in the next several days and Follow-up in 4-6 months or sooner if needed.  HPI: [name] returns to the office in follow-up of allergic rhinoconjunctivitis, asthma, food allergy, and previous concern for fruit triggered hives.  Overall since her last visit in  January, she describes medication regime is beneficial and no recurring  Pro Air use,  disruption of sleep or activity.  She maintains Qvar 2 puffs each morning and Zyrtec once daily fairly consistently.  In the last 4 days (related to seemed weather days) she has noted intermittent nasal congestion, slight cough without wheeze, difficulty breathing, shortness of breath, chest tightness or need for [employer].  Denies headache, fever, sore throat, discolored drainage or productive cough.  Intermittently there is mild itchy eyes but no swelling or recurrent rashes.  She wonders about occasional raised, possible hive-like areas at cheek or face area as related to homemade smoothies (which containes multiple fruits as previously discussed).  There has been no acute reactions, GI upset or other respiratory symptoms associated with any ingestion.  Denies ED or urgent care visits, prednisone or antibiotic courses. Reports sleep and activity are normal.  [name] has a current medication list which includes the following prescription(s): acyclovir, albuterol, beclomethasone, benztropine, cetirizine, diphenhydramine, elvitegravir-cobicistat-emtricitabine-tenofovir, epinephrine, hydromorphone, adult one daily gummies, olanzapine, pantoprazole, and voltaren.   Drug Allergies: Allergies  Allergen Reactions  . Other Hives and Itching    Berries, TREE NUTS  . Bactrim Itching  . Orange Fruit Itching  . Peanut-Containing Drug Products Hives  . Shellfish Allergy Hives  . Sulfa Antibiotics Itching  . Dapsone Itching and Rash   Objective:   Filed Vitals:   06/26/16 1516  BP:  108/80  Pulse: 89  Temp: 98.5 F (36.9 C)  Resp: 18   SpO2 Readings from Last 1 Encounters:  06/26/16 98%   Physical Exam  Constitutional: She is well-developed, well-nourished, and in no distress.  HENT:  Head: Atraumatic.  Right Ear: Tympanic membrane and ear canal normal.  Left Ear: Tympanic membrane and ear canal normal.  Nose:  Mucosal edema present. No rhinorrhea. No epistaxis.  Mouth/Throat: Oropharynx is clear and moist and mucous membranes are normal. No oropharyngeal exudate, posterior oropharyngeal edema or posterior oropharyngeal erythema.  Neck: Neck supple.  Cardiovascular: Normal rate, S1 normal and S2 normal.   No murmur heard. Pulmonary/Chest: Effort normal. She has no wheezes. She has no rhonchi. She has no rales.  Lymphadenopathy:    She has no cervical adenopathy.  Skin: Skin is warm and intact. No cyanosis. Nails show no clubbing.  Few tiny flesh-colored papular areas at right cheek, without erythema or urticarial lesions.   Diagnostics: Spirometry:  FVC  3.43--100%, FEV1  2.94--104%.  ACT=20.    Roselyn M. Ishmael Holter, MD  cc: Philis Fendt, MD

## 2016-06-26 NOTE — Patient Instructions (Addendum)
   Obtain labs at [facility] as discussed.  Keep written diary of any further skin change episodes.  Avoid fragranced soaps/lotions/detergents.  Moisturize with Vanicream or Cerave.  For the next 2 weeks use QVAR 2 puffs twice daily, then decrease to each am only as long as symptom free.  ProAir 2 puffs every 4 hours as needed--call with any recurring use.  Continue Zyrtec and Rhinocort daily.  Use Saline nasal wash each evening at shower time.  Keep follow-up with Dr. Megan Salon.  Follow-up in 4-6 months or sooner if needed.

## 2016-10-14 ENCOUNTER — Encounter (HOSPITAL_COMMUNITY): Payer: Self-pay

## 2016-10-14 ENCOUNTER — Emergency Department (HOSPITAL_COMMUNITY)
Admission: EM | Admit: 2016-10-14 | Discharge: 2016-10-14 | Disposition: A | Discharge status: Home / Self Care | Payer: Medicaid Other | Attending: Dermatology | Admitting: Dermatology

## 2016-10-14 DIAGNOSIS — R10816 Epigastric abdominal tenderness: Secondary | ICD-10-CM

## 2016-10-14 DIAGNOSIS — R1013 Epigastric pain: Secondary | ICD-10-CM | POA: Insufficient documentation

## 2016-10-14 DIAGNOSIS — J45909 Unspecified asthma, uncomplicated: Secondary | ICD-10-CM | POA: Diagnosis not present

## 2016-10-14 DIAGNOSIS — R10811 Right upper quadrant abdominal tenderness: Secondary | ICD-10-CM

## 2016-10-14 LAB — COMPREHENSIVE METABOLIC PANEL
ALK PHOS: 90 U/L (ref 38–126)
ALT: 59 U/L — AB (ref 14–54)
AST: 116 U/L — ABNORMAL HIGH (ref 15–41)
Albumin: 4 g/dL (ref 3.5–5.0)
Anion gap: 9 (ref 5–15)
BILIRUBIN TOTAL: 0.5 mg/dL (ref 0.3–1.2)
BUN: 11 mg/dL (ref 6–20)
CALCIUM: 8.9 mg/dL (ref 8.9–10.3)
CO2: 22 mmol/L (ref 22–32)
CREATININE: 0.78 mg/dL (ref 0.44–1.00)
Chloride: 104 mmol/L (ref 101–111)
Glucose, Bld: 92 mg/dL (ref 65–99)
Potassium: 3.5 mmol/L (ref 3.5–5.1)
Sodium: 135 mmol/L (ref 135–145)
TOTAL PROTEIN: 7.2 g/dL (ref 6.5–8.1)

## 2016-10-14 LAB — CBC
HCT: 28 % — ABNORMAL LOW (ref 36.0–46.0)
Hemoglobin: 9 g/dL — ABNORMAL LOW (ref 12.0–15.0)
MCH: 22.8 pg — ABNORMAL LOW (ref 26.0–34.0)
MCHC: 32.1 g/dL (ref 30.0–36.0)
MCV: 70.9 fL — ABNORMAL LOW (ref 78.0–100.0)
PLATELETS: 201 10*3/uL (ref 150–400)
RBC: 3.95 MIL/uL (ref 3.87–5.11)
RDW: 16.4 % — AB (ref 11.5–15.5)
WBC: 3.9 10*3/uL — AB (ref 4.0–10.5)

## 2016-10-14 LAB — LIPASE, BLOOD: LIPASE: 21 U/L (ref 11–51)

## 2016-10-14 NOTE — ED Notes (Signed)
Pt. Came up to the front and stated, "I have to leave." Encouraged pt. To stay, she stated, "I'm leaving."

## 2016-10-14 NOTE — ED Triage Notes (Signed)
Patient here with 7 weeks of epigastric pain with radiation to right side. Has been treated for reflux and doesn't think the omeprazole is working. States pain worse with any movement or bending.

## 2016-10-16 ENCOUNTER — Other Ambulatory Visit: Payer: Self-pay | Admitting: Gastroenterology

## 2016-10-16 ENCOUNTER — Emergency Department (HOSPITAL_COMMUNITY): Payer: Medicaid Other

## 2016-10-16 ENCOUNTER — Emergency Department (HOSPITAL_COMMUNITY)
Admission: EM | Admit: 2016-10-16 | Discharge: 2016-10-17 | Disposition: A | Discharge status: Home / Self Care | Payer: Medicaid Other | Attending: Emergency Medicine | Admitting: Emergency Medicine

## 2016-10-16 ENCOUNTER — Encounter (HOSPITAL_COMMUNITY): Payer: Self-pay | Admitting: Emergency Medicine

## 2016-10-16 DIAGNOSIS — K5900 Constipation, unspecified: Secondary | ICD-10-CM | POA: Insufficient documentation

## 2016-10-16 DIAGNOSIS — Z79899 Other long term (current) drug therapy: Secondary | ICD-10-CM | POA: Insufficient documentation

## 2016-10-16 DIAGNOSIS — N760 Acute vaginitis: Secondary | ICD-10-CM

## 2016-10-16 DIAGNOSIS — J45909 Unspecified asthma, uncomplicated: Secondary | ICD-10-CM | POA: Insufficient documentation

## 2016-10-16 DIAGNOSIS — Z91010 Allergy to peanuts: Secondary | ICD-10-CM | POA: Diagnosis not present

## 2016-10-16 DIAGNOSIS — B9689 Other specified bacterial agents as the cause of diseases classified elsewhere: Secondary | ICD-10-CM

## 2016-10-16 DIAGNOSIS — R1013 Epigastric pain: Secondary | ICD-10-CM

## 2016-10-16 DIAGNOSIS — B20 Human immunodeficiency virus [HIV] disease: Secondary | ICD-10-CM | POA: Insufficient documentation

## 2016-10-16 DIAGNOSIS — R1011 Right upper quadrant pain: Secondary | ICD-10-CM

## 2016-10-16 LAB — CBC WITH DIFFERENTIAL/PLATELET
BASOS ABS: 0 10*3/uL (ref 0.0–0.1)
BASOS PCT: 1 %
EOS ABS: 0.1 10*3/uL (ref 0.0–0.7)
Eosinophils Relative: 3 %
HEMATOCRIT: 28.6 % — AB (ref 36.0–46.0)
HEMOGLOBIN: 9.3 g/dL — AB (ref 12.0–15.0)
Lymphocytes Relative: 10 %
Lymphs Abs: 0.4 10*3/uL — ABNORMAL LOW (ref 0.7–4.0)
MCH: 23 pg — ABNORMAL LOW (ref 26.0–34.0)
MCHC: 32.5 g/dL (ref 30.0–36.0)
MCV: 70.8 fL — ABNORMAL LOW (ref 78.0–100.0)
MONOS PCT: 9 %
Monocytes Absolute: 0.4 10*3/uL (ref 0.1–1.0)
NEUTROS ABS: 3.2 10*3/uL (ref 1.7–7.7)
NEUTROS PCT: 77 %
Platelets: 172 10*3/uL (ref 150–400)
RBC: 4.04 MIL/uL (ref 3.87–5.11)
RDW: 16.3 % — AB (ref 11.5–15.5)
WBC: 4.1 10*3/uL (ref 4.0–10.5)

## 2016-10-16 LAB — PREGNANCY, URINE: PREG TEST UR: NEGATIVE

## 2016-10-16 LAB — COMPREHENSIVE METABOLIC PANEL
ALBUMIN: 4.1 g/dL (ref 3.5–5.0)
ALK PHOS: 93 U/L (ref 38–126)
ALT: 73 U/L — ABNORMAL HIGH (ref 14–54)
ANION GAP: 7 (ref 5–15)
AST: 124 U/L — ABNORMAL HIGH (ref 15–41)
BILIRUBIN TOTAL: 0.8 mg/dL (ref 0.3–1.2)
BUN: 10 mg/dL (ref 6–20)
CALCIUM: 9.3 mg/dL (ref 8.9–10.3)
CO2: 23 mmol/L (ref 22–32)
Chloride: 106 mmol/L (ref 101–111)
Creatinine, Ser: 0.76 mg/dL (ref 0.44–1.00)
GLUCOSE: 95 mg/dL (ref 65–99)
POTASSIUM: 3.9 mmol/L (ref 3.5–5.1)
Sodium: 136 mmol/L (ref 135–145)
TOTAL PROTEIN: 7.7 g/dL (ref 6.5–8.1)

## 2016-10-16 LAB — WET PREP, GENITAL
Sperm: NONE SEEN
TRICH WET PREP: NONE SEEN
YEAST WET PREP: NONE SEEN

## 2016-10-16 LAB — URINALYSIS, ROUTINE W REFLEX MICROSCOPIC
Glucose, UA: NEGATIVE mg/dL
Hgb urine dipstick: NEGATIVE
KETONES UR: 15 mg/dL — AB
LEUKOCYTES UA: NEGATIVE
NITRITE: NEGATIVE
Protein, ur: NEGATIVE mg/dL
SPECIFIC GRAVITY, URINE: 1.018 (ref 1.005–1.030)
pH: 6 (ref 5.0–8.0)

## 2016-10-16 LAB — LIPASE, BLOOD: LIPASE: 19 U/L (ref 11–51)

## 2016-10-16 MED ORDER — SODIUM CHLORIDE 0.9 % IV BOLUS (SEPSIS)
1000.0000 mL | Freq: Once | INTRAVENOUS | Status: AC
  Administered 2016-10-16: 1000 mL via INTRAVENOUS

## 2016-10-16 MED ORDER — HYDROMORPHONE HCL 1 MG/ML IJ SOLN
1.0000 mg | Freq: Once | INTRAMUSCULAR | Status: AC
  Administered 2016-10-16: 1 mg via INTRAVENOUS
  Filled 2016-10-16: qty 1

## 2016-10-16 MED ORDER — MORPHINE SULFATE (PF) 2 MG/ML IV SOLN
2.0000 mg | Freq: Once | INTRAVENOUS | Status: AC
Start: 2016-10-16 — End: 2016-10-16
  Administered 2016-10-16: 2 mg via INTRAVENOUS
  Filled 2016-10-16: qty 1

## 2016-10-16 MED ORDER — IOPAMIDOL (ISOVUE-300) INJECTION 61%
100.0000 mL | Freq: Once | INTRAVENOUS | Status: AC | PRN
  Administered 2016-10-16: 100 mL via INTRAVENOUS

## 2016-10-16 NOTE — ED Provider Notes (Signed)
Complains of right upper quadrant pain, nonradiating onset 7 weeks ago. Pain is exacerbated by eating. She vomited last time several days ago. No treatment prior to coming here nothing makes pain improved. She's been evaluated by Dr. Michail Sermon for same pain earlier this week. She is presently asymptomatic as I examine her. On exam alert no distress lungs clear auscultation heart regular rate and rhythm abdomen nondistended nontender, normal active bowel sounds   Orlie Dakin, MD 10/17/16 [phone]

## 2016-10-16 NOTE — ED Provider Notes (Signed)
[facility] DEPT Provider Note   CSN: [id number] Arrival date & time: 10/16/16  1752 By signing my name below, I, Dyke Brackett, attest that this documentation has been prepared under the direction and in the presence of non-physician practitioner, Gay Filler, PA-C.  Electronically Signed: Dyke Brackett, Scribe. 10/16/2016. 8:23 PM.   History   Chief Complaint Chief Complaint  Patient presents with  . Abdominal Pain    HPI Margarine I Eisenbraun is a 40 y.o. female with hx of AIDS, GERD, and hepatitis B who presents to the Emergency Department complaining of intermittent RUQ abdominal pain that radiates to her back onset seven weeks ago. Pt describes her pain as sharp and states sharpness will last for 1-2 minutes and then subside. Pain is exacerbated by laying on her back, eating or drinking. Pt has taken Prilosec, aleve, BC powder with no relief. She states she has taken hydrocodone with relief of pain. Pt has had one sexual partner in the last 6 months and states they use barrier protection each time. Pt reports noncompliance with her retrovirals because "the pills are hard to swallow". She notes associated constipation, decreased appetite  and white vaginal discharge. Pt endorses vomiting 1x, but states this was due to taking her medication on an empty stomach.  Pt denies any fever, vision changes, headache, LOC, nausea, vomiting, diarrhea, vaginal bleeding, hematuria, dysuria, rashes, SOB, CP, or vaginal pain.   The history is provided by the patient. No language interpreter was used.   Past Medical History:  Diagnosis Date  . Acute psychosis 03/10/12   2nd admission in last wk for this  . Angio-edema   . Anxiety   . Asthma    inhaler 2xday  . Bipolar disorder ([place])   . Depression   . GERD (gastroesophageal reflux disease)   . Hepatitis B    /E-chart  . History of blood transfusion   . HIV positive ([place])   . Microcytic anemia    h/o per E-chart  . Noncompliance with medication  regimen    /e-chart  . 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 Pyelonephritis    h/o per E-chart  . Shortness of breath dyspnea    due to Asthma  . Thyroid disease   . Urticaria     Patient Active Problem List   Diagnosis Date Noted  . Dysphagia 09/11/2015  . Salmonella bacteremia   . Esophageal stricture 12/06/2014  . Protein-calorie malnutrition, severe ([place]) 12/06/2014  . Constipation 10/04/2014  . CIN I (cervical intraepithelial neoplasia I) 01/11/2014  . 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 Elevated lipase 07/10/2013  . Elevated alkaline phosphatase level 07/10/2013  . Esophageal ulcer 07/10/2013  . Bipolar disorder ([place]) 04/01/2012  . Human immunodeficiency virus (HIV) disease ([place]) 02/28/2009  . GENITAL HERPES 02/28/2009  . Microcytic anemia 02/27/2009    Past Surgical History:  Procedure Laterality Date  . BALLOON DILATION N/A 02/14/2015   Procedure: BALLOON DILATION;  Surgeon: Lear Ng, MD;  Location: [facility] ENDOSCOPY;  Service: Endoscopy;  Laterality: N/A;  . ESOPHAGOGASTRODUODENOSCOPY N/A 07/13/2013   Procedure: ESOPHAGOGASTRODUODENOSCOPY (EGD);  Surgeon: Beryle Beams, MD;  Location: [facility] ENDOSCOPY;  Service: Endoscopy;  Laterality: N/A;  . ESOPHAGOGASTRODUODENOSCOPY N/A 08/12/2014   Procedure: ESOPHAGOGASTRODUODENOSCOPY (EGD);  Surgeon: Milus Banister, MD;  Location: [facility] ENDOSCOPY;  Service: Endoscopy;  Laterality: N/A;  . ESOPHAGOGASTRODUODENOSCOPY N/A 02/14/2015   Procedure: ESOPHAGOGASTRODUODENOSCOPY (EGD);  Surgeon: Lear Ng, MD;  Location: [facility];  Service:  Endoscopy;  Laterality: N/A;  . ESOPHAGOGASTRODUODENOSCOPY (EGD) WITH PROPOFOL N/A 04/11/2015   Procedure: ESOPHAGOGASTRODUODENOSCOPY (EGD) WITH PROPOFOL;  Surgeon: Wilford Corner, MD;  Location: [facility] ENDOSCOPY;  Service: Endoscopy;  Laterality: N/A;  . ESOPHAGOGASTRODUODENOSCOPY (EGD) WITH PROPOFOL N/A 09/11/2015   Procedure:  ESOPHAGOGASTRODUODENOSCOPY (EGD) WITH PROPOFOL;  Surgeon: Wilford Corner, MD;  Location: [facility] ENDOSCOPY;  Service: Endoscopy;  Laterality: N/A;  . SAVORY DILATION N/A 02/14/2015   Procedure: SAVORY DILATION;  Surgeon: Lear Ng, MD;  Location: [facility];  Service: Endoscopy;  Laterality: N/A;  no xray needed  . SAVORY DILATION N/A 04/11/2015   Procedure: SAVORY DILATION;  Surgeon: Wilford Corner, MD;  Location: [facility] ENDOSCOPY;  Service: Endoscopy;  Laterality: N/A;  . SAVORY DILATION N/A 09/11/2015   Procedure: SAVORY DILATION;  Surgeon: Wilford Corner, MD;  Location: [facility] ENDOSCOPY;  Service: Endoscopy;  Laterality: N/A;  . THYROID LOBECTOMY  08/2002   left & isthmectomy; for benign thyroid adenoma/E-chart  . THYROID SURGERY    . TUBAL LIGATION  07/2002   /E-chart    OB History    No data available       Home Medications    Prior to Admission medications   Medication Sig Start Date End Date Taking? Authorizing Provider  albuterol (PROAIR HFA) 108 (90 BASE) MCG/ACT inhaler Inhale 2 puffs into the lungs every 4 (four) hours as needed for wheezing or shortness of breath (cough). 11/16/15  Yes Roselyn Malachy Moan, MD  cetirizine (ZYRTEC) 10 MG tablet Take 1 tablet (10 mg total) by mouth daily. 06/26/16  Yes Roselyn Malachy Moan, MD  elvitegravir-cobicistat-emtricitabine-tenofovir (GENVOYA) 150-150-200-10 MG TABS tablet Take 1 tablet by mouth daily with breakfast. 01/13/15  Yes Michel Bickers, MD  EPINEPHrine 0.3 mg/0.3 mL IJ SOAJ injection Inject 0.3 mLs (0.3 mg total) into the muscle once. 12/20/15  Yes Roselyn Malachy Moan, MD  pantoprazole (PROTONIX) 40 MG tablet Take 1 tablet (40 mg total) by mouth 2 (two) times daily. 12/12/14  Yes Theodis Blaze, MD  beclomethasone (QVAR) 80 MCG/ACT inhaler Inhale 2 puffs into the lungs daily. Patient not taking: Reported on 10/16/2016 06/26/16   Gean Quint, MD  benztropine (COGENTIN) 1 MG tablet Take 1 tablet (1 mg total) by mouth daily. Patient not taking:  Reported on 10/16/2016 08/20/14   Theodis Blaze, MD  metoCLOPramide (REGLAN) 10 MG tablet Take 1 tablet (10 mg total) by mouth every 6 (six) hours as needed for nausea. 10/17/16   Roxanna Mew, PA-C  metroNIDAZOLE (FLAGYL) 500 MG tablet Take 1 tablet (500 mg total) by mouth 2 (two) times daily. 10/17/16 10/24/16  Roxanna Mew, PA-C  OLANZapine (ZYPREXA) 5 MG tablet TAKE 1 TABLET BY MOUTH AT BEDTIME Patient not taking: Reported on 10/16/2016 03/22/15   Michel Bickers, MD  oxyCODONE (OXY IR/ROXICODONE) 5 MG immediate release tablet Take 1 tablet (5 mg total) by mouth every 6 (six) hours as needed for severe pain. 10/17/16   Roxanna Mew, PA-C    Family History Family History  Problem Relation Age of Onset  . Cancer Mother   . Diabetes Mother   . Diabetes Father   . Heart disease Father   . Diabetes Sister   . Urticaria Sister   . Asthma Son   . Allergic rhinitis Neg Hx   . Eczema Neg Hx   . Immunodeficiency Neg Hx     Social History Social History  Substance Use Topics  . Smoking status: Never Smoker  . Smokeless tobacco: Never  Used  . Alcohol use 1.2 oz/week    2 Glasses of wine per week     Comment: occ    Allergies   Other; Bactrim; Orange fruit; Peanut-containing drug products; Shellfish allergy; Sulfa antibiotics; and Dapsone  Review of Systems Review of Systems  Constitutional: Positive for appetite change. Negative for chills, diaphoresis and fever.  HENT: Negative for trouble swallowing.   Eyes: Negative for visual disturbance.  Respiratory: Negative for shortness of breath.   Cardiovascular: Negative for chest pain.  Gastrointestinal: Positive for abdominal pain and constipation. Negative for blood in stool, diarrhea, nausea and vomiting.  Genitourinary: Positive for vaginal discharge. Negative for dysuria, hematuria, vaginal bleeding and vaginal pain.  Musculoskeletal: Negative for neck pain.  Skin: Negative for rash.  Neurological: Negative for  syncope and headaches.   Physical Exam Updated Vital Signs BP 120/86 (BP Location: Right Arm)   Pulse 120   Temp 98.6 F (37 C) (Oral)   Resp 18   LMP 09/25/2016   SpO2 100%   Physical Exam  Constitutional: She appears well-developed and well-nourished. No distress.  HENT:  Head: Normocephalic and atraumatic.  Mouth/Throat: Oropharynx is clear and moist. No oropharyngeal exudate.  Eyes: Conjunctivae and EOM are normal. Pupils are equal, round, and reactive to light. Right eye exhibits no discharge. Left eye exhibits no discharge. No scleral icterus.  Neck: Normal range of motion and phonation normal. Neck supple. No neck rigidity. Normal range of motion present.  Cardiovascular: Normal rate, regular rhythm, normal heart sounds and intact distal pulses.   No murmur heard. On my assessment pulse 90bpm.   Pulmonary/Chest: Effort normal and breath sounds normal. No stridor. No respiratory distress. She has no wheezes. She has no rales.  Abdominal: Soft. Bowel sounds are normal. She exhibits no distension. There is tenderness (tender in her RUQ and epigastrim ). There is guarding. There is no rigidity, no rebound and no CVA tenderness.  Mild guarding. No rigidity or peritoneal signs.   Genitourinary: Pelvic exam was performed with patient supine.  Genitourinary Comments: Chaperone present for duration of exam. External anatomy normal - no injury, lesions, masses, or rashes. No bleeding, lesions, masses, or ulcerations in vaginal cavity. Cervix is closed with off-white discharge. No friability. No CMT, no adnexal tenderness, no masses palpated on bimanual exam.    Musculoskeletal: Normal range of motion.  Lymphadenopathy:    She has no cervical adenopathy.  Neurological: She is alert. She is not disoriented. Coordination and gait normal. GCS eye subscore is 4. GCS verbal subscore is 5. GCS motor subscore is 6.  Skin: Skin is warm and dry. She is not diaphoretic.  Psychiatric: She has a  normal mood and affect. Her behavior is normal.    ED Treatments / Results  DIAGNOSTIC STUDIES:  Oxygen Saturation is 100% on RA, normal by my interpretation.    COORDINATION OF CARE:  8:20 PM Will order CBC, lipase, CMP, and urinalysis. Discussed treatment plan which includes morphine with pt at bedside and pt agreed to plan.   Labs (all labs ordered are listed, but only abnormal results are displayed) Labs Reviewed  WET PREP, GENITAL - Abnormal; Notable for the following:       Result Value   Clue Cells Wet Prep HPF POC PRESENT (*)    WBC, Wet Prep HPF POC MANY (*)    All other components within normal limits  COMPREHENSIVE METABOLIC PANEL - Abnormal; Notable for the following:    AST 124 (*)  ALT 73 (*)    All other components within normal limits  CBC WITH DIFFERENTIAL/PLATELET - Abnormal; Notable for the following:    Hemoglobin 9.3 (*)    HCT 28.6 (*)    MCV 70.8 (*)    MCH 23.0 (*)    RDW 16.3 (*)    Lymphs Abs 0.4 (*)    All other components within normal limits  URINALYSIS, ROUTINE W REFLEX MICROSCOPIC (NOT AT [facility]) - Abnormal; Notable for the following:    APPearance CLOUDY (*)    Bilirubin Urine SMALL (*)    Ketones, ur 15 (*)    All other components within normal limits  LIPASE, BLOOD  PREGNANCY, URINE  HEPATITIS PANEL, ACUTE  GC/CHLAMYDIA PROBE AMP ([place]) NOT AT [facility]    EKG  EKG Interpretation None       Radiology Ct Abdomen Pelvis W Contrast  Result Date: 10/16/2016 CLINICAL DATA:  Right upper quadrant pain and elevated liver function tests EXAM: CT ABDOMEN AND PELVIS WITH CONTRAST TECHNIQUE: Multidetector CT imaging of the abdomen and pelvis was performed using the standard protocol following bolus administration of intravenous contrast. CONTRAST:  11mL ISOVUE-300 IOPAMIDOL (ISOVUE-300) INJECTION 61% COMPARISON:  Abdominal ultrasound 10/16/2016 FINDINGS: Lower chest: No pulmonary nodules. No visible pleural or pericardial effusion.  Hepatobiliary: Normal hepatic size and contours without focal liver lesion. No perihepatic ascites. No intra- or extrahepatic biliary dilatation. Normal gallbladder. Pancreas: Normal pancreatic contours and enhancement. No peripancreatic fluid collection or pancreatic ductal dilatation. Spleen: Normal. Adrenals/Urinary Tract: Normal adrenal glands. No hydronephrosis or solid renal mass. Stomach/Bowel: No abnormal bowel dilatation. No bowel wall thickening or adjacent fat stranding to indicate acute inflammation. No abdominal fluid collection. Normal appendix. Vascular/Lymphatic: Normal course and caliber of the major abdominal vessels. No abdominal or pelvic adenopathy. Reproductive: Uterus and ovaries are normal for patient age. Small amounts of fluid in the pelvis may be physiologic. Musculoskeletal: No acute osseous injury. No bony spinal canal stenosis. Normal visualized extrathoracic and extraperitoneal soft tissues. Other: No contributory non-categorized findings. IMPRESSION: No acute abnormality of the abdomen or pelvis. Electronically Signed   By: Ulyses Jarred M.D.   On: 10/16/2016 22:54   US Abdomen Limited Ruq  Result Date: 10/16/2016 CLINICAL DATA:  Chronic right upper quadrant abdominal pain. Initial encounter. EXAM: US ABDOMEN LIMITED - RIGHT UPPER QUADRANT COMPARISON:  CT of the abdomen and pelvis performed 12/06/2014, and abdominal ultrasound performed 08/10/2014 FINDINGS: Gallbladder: No gallstones or wall thickening visualized. No sonographic Murphy sign noted by sonographer. Common bile duct: Diameter: 0.4 cm, within normal limits in caliber. Liver: No focal lesion identified. Within normal limits in parenchymal echogenicity, though echotexture is somewhat coarsened. IMPRESSION: No acute abnormality seen at the right upper quadrant. Electronically Signed   By: Garald Balding M.D.   On: 10/16/2016 21:29    Procedures Procedures (including critical care time)  Medications Ordered in  ED Medications  sodium chloride 0.9 % bolus 1,000 mL (0 mLs Intravenous Stopped 10/17/16 0018)  morphine 2 MG/ML injection 2 mg (2 mg Intravenous Given 10/16/16 2049)  HYDROmorphone (DILAUDID) injection 1 mg (1 mg Intravenous Given 10/16/16 2207)  iopamidol (ISOVUE-300) 61 % injection 100 mL (100 mLs Intravenous Contrast Given 10/16/16 2219)  metroNIDAZOLE (FLAGYL) tablet 500 mg (500 mg Oral Given 10/17/16 0020)    Initial Impression / Assessment and Plan / ED Course  I have reviewed the triage vital signs and the nursing notes.  Pertinent labs & imaging results that were available during my care of  the patient were reviewed by me and considered in my medical decision making (see chart for details).  Clinical Course  Value Comment By Time  US Abdomen Limited RUQ Reviewed.  Roxanna Mew, [place] 11/01 2140   On re-evaluation patient endorses improvement in pain; however, starting to come back.  Roxanna Mew, [place] 11/01 2223  CT Abdomen Pelvis W Contrast Reviewed Roxanna Mew, PA-C 11/01 2315    Patient presents to ED with complaint of intermittent RUQ x 7 weeks. Patient is afebrile and non-toxic appearing in NAD. VSS. Physical exam remarkable for TTP of RUQ and epigastric with mild guarding. No rigidity or peritoneal signs. Abdomen is soft, +BS. Pelvic exam remarkable for off-white discharge; no TTP or masses palpable on bimanual exam. IVF and pain medication initiated. Will check basic labs and RUQ [place] for posisble cholecystitis.   LFTs elevated - ?hepatitis. No leukocytosis. Anemia stable. Lipase nml. U/A negative for UTI. BV noted on wet prep. RUQ US unremarkable. Patient endorses slight improvement in pain; however, worsening again. Will re-dose pain medication. Will check CT abd/pelvis. Will check hepatitis panel. Discussed patient with Dr. Winfred Leeds, who also saw patient, agrees with plan.   CT abd/pelvis no evidence of acute pathology. Pain improved. Rx oxycodone for  severe pain and reglan. Review of [place] controlled substance database shows no recent narcotic rx. Encouraged close follow up with GI, Dr. Michail Sermon, call in am. Patient h/o AIDs, not currently on anti-retrovirals. Discussed increased risk of opportunistic infections. Encouraged follow up with Dr. Megan Salon of ID for further management. Strict return precautions given. Patient voiced understanding and is agreeable.    Final Clinical Impressions(s) / ED Diagnoses   Final diagnoses:  RUQ pain  Bacterial vaginosis    New Prescriptions Discharge Medication List as of 10/17/2016 12:15 AM    START taking these medications   Details  metoCLOPramide (REGLAN) 10 MG tablet Take 1 tablet (10 mg total) by mouth every 6 (six) hours as needed for nausea., Starting Thu 10/17/2016, Print    metroNIDAZOLE (FLAGYL) 500 MG tablet Take 1 tablet (500 mg total) by mouth 2 (two) times daily., Starting Thu 10/17/2016, Until Thu 10/24/2016, Print    oxyCODONE (OXY IR/ROXICODONE) 5 MG immediate release tablet Take 1 tablet (5 mg total) by mouth every 6 (six) hours as needed for severe pain., Starting Thu 10/17/2016, Print      I personally performed the services described in this documentation, which was scribed in my presence. The recorded information has been reviewed and is accurate.    Roxanna Mew, PA-C 10/17/16 [place], MD 10/17/16 2328

## 2016-10-16 NOTE — ED Triage Notes (Signed)
Per pt, states RUQ pain radiating to back for 7 weeks-states GI MD order a study for tomorrow but she cant wait due to the pain

## 2016-10-17 ENCOUNTER — Other Ambulatory Visit: Payer: Medicaid Other

## 2016-10-17 LAB — GC/CHLAMYDIA PROBE AMP (~~LOC~~) NOT AT ARMC
CHLAMYDIA, DNA PROBE: NEGATIVE
Neisseria Gonorrhea: NEGATIVE

## 2016-10-17 MED ORDER — OXYCODONE HCL 5 MG PO TABS: 5.0000 mg | ORAL_TABLET | Freq: Four times a day (QID) | ORAL | 0 refills | Status: DC | PRN

## 2016-10-17 MED ORDER — METOCLOPRAMIDE HCL 10 MG PO TABS: 10.0000 mg | ORAL_TABLET | Freq: Four times a day (QID) | ORAL | 0 refills | Status: DC | PRN

## 2016-10-17 MED ORDER — METRONIDAZOLE 500 MG PO TABS: 500.0000 mg | ORAL_TABLET | Freq: Two times a day (BID) | ORAL | 0 refills | Status: DC

## 2016-10-17 MED ORDER — METRONIDAZOLE 500 MG PO TABS
500.0000 mg | ORAL_TABLET | Freq: Once | ORAL | Status: AC
  Administered 2016-10-17: 500 mg via ORAL
  Filled 2016-10-17: qty 1

## 2016-10-17 NOTE — Discharge Instructions (Signed)
Read the information below.  Your liver enzymes are elevated. A hepatitis panel is being completed; however, the results will not be back today. You will be notified if abnormal. It is very important that you call Dr. Michail Sermon in the morning to schedule a follow up appointment. You are being prescribed oxycodone and reglan for pain relief. Do not take tylenol.  You have bacterial vaginosis. You are being treated with antibiotics, please take as directed.  It is very important that you follow up with Dr. Megan Salon your infectious disease doctor in the morning.  Use the prescribed medication as directed.  Please discuss all new medications with your pharmacist.   You may return to the Emergency Department at any time for worsening condition or any new symptoms that concern you. Return if you develop fever, unable to keep food/fluids down, blood in stool, or any other new/concerning symptoms.

## 2016-10-18 LAB — HEPATITIS PANEL, ACUTE
HEP B S AG: NEGATIVE
Hep A IgM: NEGATIVE
Hep B C IgM: NEGATIVE

## 2016-10-23 ENCOUNTER — Inpatient Hospital Stay (HOSPITAL_COMMUNITY)
Admission: EM | Admit: 2016-10-23 | Discharge: 2016-10-28 | DRG: 391 | Disposition: A | Discharge status: Home / Self Care | Payer: Medicaid Other | Attending: Internal Medicine | Admitting: Internal Medicine

## 2016-10-23 ENCOUNTER — Encounter (HOSPITAL_COMMUNITY): Payer: Self-pay

## 2016-10-23 DIAGNOSIS — E890 Postprocedural hypothyroidism: Secondary | ICD-10-CM

## 2016-10-23 DIAGNOSIS — J309 Allergic rhinitis, unspecified: Secondary | ICD-10-CM | POA: Diagnosis present

## 2016-10-23 DIAGNOSIS — K219 Gastro-esophageal reflux disease without esophagitis: Secondary | ICD-10-CM | POA: Diagnosis present

## 2016-10-23 DIAGNOSIS — Z9114 Patient's other noncompliance with medication regimen: Secondary | ICD-10-CM

## 2016-10-23 DIAGNOSIS — Z6822 Body mass index (BMI) 22.0-22.9, adult: Secondary | ICD-10-CM

## 2016-10-23 DIAGNOSIS — J45909 Unspecified asthma, uncomplicated: Secondary | ICD-10-CM | POA: Diagnosis present

## 2016-10-23 DIAGNOSIS — Z833 Family history of diabetes mellitus: Secondary | ICD-10-CM

## 2016-10-23 DIAGNOSIS — Z888 Allergy status to other drugs, medicaments and biological substances status: Secondary | ICD-10-CM

## 2016-10-23 DIAGNOSIS — Z8249 Family history of ischemic heart disease and other diseases of the circulatory system: Secondary | ICD-10-CM

## 2016-10-23 DIAGNOSIS — R112 Nausea with vomiting, unspecified: Secondary | ICD-10-CM | POA: Diagnosis not present

## 2016-10-23 DIAGNOSIS — Z9119 Patient's noncompliance with other medical treatment and regimen: Secondary | ICD-10-CM

## 2016-10-23 DIAGNOSIS — K222 Esophageal obstruction: Principal | ICD-10-CM

## 2016-10-23 DIAGNOSIS — Z9889 Other specified postprocedural states: Secondary | ICD-10-CM

## 2016-10-23 DIAGNOSIS — Z91013 Allergy to seafood: Secondary | ICD-10-CM

## 2016-10-23 DIAGNOSIS — Z881 Allergy status to other antibiotic agents status: Secondary | ICD-10-CM

## 2016-10-23 DIAGNOSIS — Z809 Family history of malignant neoplasm, unspecified: Secondary | ICD-10-CM

## 2016-10-23 DIAGNOSIS — K210 Gastro-esophageal reflux disease with esophagitis: Secondary | ICD-10-CM | POA: Diagnosis present

## 2016-10-23 DIAGNOSIS — E860 Dehydration: Secondary | ICD-10-CM

## 2016-10-23 DIAGNOSIS — D638 Anemia in other chronic diseases classified elsewhere: Secondary | ICD-10-CM | POA: Diagnosis present

## 2016-10-23 DIAGNOSIS — Z21 Asymptomatic human immunodeficiency virus [HIV] infection status: Secondary | ICD-10-CM

## 2016-10-23 DIAGNOSIS — F319 Bipolar disorder, unspecified: Secondary | ICD-10-CM | POA: Diagnosis present

## 2016-10-23 DIAGNOSIS — E872 Acidosis, unspecified: Secondary | ICD-10-CM

## 2016-10-23 DIAGNOSIS — R824 Acetonuria: Secondary | ICD-10-CM

## 2016-10-23 DIAGNOSIS — Z91010 Allergy to peanuts: Secondary | ICD-10-CM

## 2016-10-23 DIAGNOSIS — E43 Unspecified severe protein-calorie malnutrition: Secondary | ICD-10-CM | POA: Insufficient documentation

## 2016-10-23 DIAGNOSIS — R1310 Dysphagia, unspecified: Secondary | ICD-10-CM

## 2016-10-23 DIAGNOSIS — Z825 Family history of asthma and other chronic lower respiratory diseases: Secondary | ICD-10-CM

## 2016-10-23 DIAGNOSIS — B3789 Other sites of candidiasis: Secondary | ICD-10-CM | POA: Diagnosis present

## 2016-10-23 DIAGNOSIS — Z882 Allergy status to sulfonamides status: Secondary | ICD-10-CM

## 2016-10-23 DIAGNOSIS — D34 Benign neoplasm of thyroid gland: Secondary | ICD-10-CM | POA: Diagnosis present

## 2016-10-23 DIAGNOSIS — B3781 Candidal esophagitis: Secondary | ICD-10-CM | POA: Diagnosis present

## 2016-10-23 DIAGNOSIS — D509 Iron deficiency anemia, unspecified: Secondary | ICD-10-CM | POA: Diagnosis present

## 2016-10-23 DIAGNOSIS — A6000 Herpesviral infection of urogenital system, unspecified: Secondary | ICD-10-CM | POA: Diagnosis present

## 2016-10-23 DIAGNOSIS — B370 Candidal stomatitis: Secondary | ICD-10-CM | POA: Diagnosis present

## 2016-10-23 DIAGNOSIS — Z91018 Allergy to other foods: Secondary | ICD-10-CM

## 2016-10-23 DIAGNOSIS — K449 Diaphragmatic hernia without obstruction or gangrene: Secondary | ICD-10-CM | POA: Diagnosis present

## 2016-10-23 DIAGNOSIS — B20 Human immunodeficiency virus [HIV] disease: Secondary | ICD-10-CM | POA: Diagnosis present

## 2016-10-23 LAB — COMPREHENSIVE METABOLIC PANEL
ALK PHOS: 93 U/L (ref 38–126)
ALT: 43 U/L (ref 14–54)
ANION GAP: 13 (ref 5–15)
AST: 56 U/L — ABNORMAL HIGH (ref 15–41)
Albumin: 4 g/dL (ref 3.5–5.0)
BILIRUBIN TOTAL: 0.7 mg/dL (ref 0.3–1.2)
BUN: 15 mg/dL (ref 6–20)
CO2: 19 mmol/L — ABNORMAL LOW (ref 22–32)
CREATININE: 0.8 mg/dL (ref 0.44–1.00)
Calcium: 9.8 mg/dL (ref 8.9–10.3)
Chloride: 111 mmol/L (ref 101–111)
GFR calc non Af Amer: >60 mL/min (ref >60)
GLUCOSE: 102 mg/dL — AB (ref 65–99)
Potassium: 3.6 mmol/L (ref 3.5–5.1)
SODIUM: 143 mmol/L (ref 135–145)
Total Protein: 7.7 g/dL (ref 6.5–8.1)

## 2016-10-23 LAB — URINALYSIS, ROUTINE W REFLEX MICROSCOPIC
GLUCOSE, UA: NEGATIVE mg/dL
LEUKOCYTES UA: NEGATIVE
Nitrite: NEGATIVE
PH: 6 (ref 5.0–8.0)
PROTEIN: 100 mg/dL — AB
Specific Gravity, Urine: 1.021 (ref 1.005–1.030)

## 2016-10-23 LAB — RAPID URINE DRUG SCREEN, HOSP PERFORMED
Amphetamines: NOT DETECTED
BARBITURATES: NOT DETECTED
Benzodiazepines: NOT DETECTED
COCAINE: NOT DETECTED
Opiates: POSITIVE — AB
Tetrahydrocannabinol: NOT DETECTED

## 2016-10-23 LAB — URINE MICROSCOPIC-ADD ON

## 2016-10-23 LAB — CBC
HEMATOCRIT: 31.1 % — AB (ref 36.0–46.0)
Hemoglobin: 10 g/dL — ABNORMAL LOW (ref 12.0–15.0)
MCH: 22.4 pg — AB (ref 26.0–34.0)
MCHC: 32.2 g/dL (ref 30.0–36.0)
MCV: 69.7 fL — AB (ref 78.0–100.0)
PLATELETS: 236 10*3/uL (ref 150–400)
RBC: 4.46 MIL/uL (ref 3.87–5.11)
RDW: 16.3 % — AB (ref 11.5–15.5)
WBC: 5.5 10*3/uL (ref 4.0–10.5)

## 2016-10-23 LAB — LIPASE, BLOOD: Lipase: 26 U/L (ref 11–51)

## 2016-10-23 LAB — I-STAT BETA HCG BLOOD, ED (MC, WL, AP ONLY): I-stat hCG, quantitative: <5 m[IU]/mL (ref <5)

## 2016-10-23 MED ORDER — SODIUM CHLORIDE 0.9 % IV BOLUS (SEPSIS)
1000.0000 mL | Freq: Once | INTRAVENOUS | Status: AC
Start: 2016-10-23 — End: 2016-10-23
  Administered 2016-10-23: 1000 mL via INTRAVENOUS

## 2016-10-23 MED ORDER — HYDROMORPHONE HCL 2 MG/ML IJ SOLN
1.0000 mg | Freq: Once | INTRAMUSCULAR | Status: AC
  Administered 2016-10-23: 1 mg via INTRAVENOUS
  Filled 2016-10-23: qty 1

## 2016-10-23 MED ORDER — DIPHENHYDRAMINE HCL 50 MG/ML IJ SOLN
25.0000 mg | Freq: Once | INTRAMUSCULAR | Status: AC
  Administered 2016-10-23: 25 mg via INTRAVENOUS
  Filled 2016-10-23: qty 1

## 2016-10-23 MED ORDER — HYDROMORPHONE HCL 1 MG/ML IJ SOLN
0.5000 mg | INTRAMUSCULAR | Status: DC | PRN
  Administered 2016-10-23 – 2016-10-27 (×18): 1 mg via INTRAVENOUS
  Filled 2016-10-23 (×18): qty 1

## 2016-10-23 MED ORDER — ONDANSETRON HCL 4 MG/2ML IJ SOLN
4.0000 mg | Freq: Four times a day (QID) | INTRAMUSCULAR | Status: DC | PRN
  Administered 2016-10-23 – 2016-10-25 (×3): 4 mg via INTRAVENOUS
  Filled 2016-10-23 (×5): qty 2

## 2016-10-23 MED ORDER — ONDANSETRON HCL 4 MG PO TABS: 4.0000 mg | ORAL_TABLET | Freq: Four times a day (QID) | ORAL | Status: DC | PRN

## 2016-10-23 MED ORDER — PANTOPRAZOLE SODIUM 40 MG PO TBEC
40.0000 mg | DELAYED_RELEASE_TABLET | Freq: Two times a day (BID) | ORAL | Status: DC
  Administered 2016-10-23 – 2016-10-28 (×10): 40 mg via ORAL
  Filled 2016-10-23 (×11): qty 1

## 2016-10-23 MED ORDER — ELVITEG-COBIC-EMTRICIT-TENOFAF 150-150-200-10 MG PO TABS
1.0000 | ORAL_TABLET | Freq: Every day | ORAL | Status: DC
  Administered 2016-10-24 – 2016-10-28 (×4): 1 via ORAL
  Filled 2016-10-23 (×5): qty 1

## 2016-10-23 MED ORDER — METOCLOPRAMIDE HCL 5 MG/ML IJ SOLN
10.0000 mg | Freq: Once | INTRAMUSCULAR | Status: AC
  Administered 2016-10-23: 10 mg via INTRAVENOUS
  Filled 2016-10-23: qty 2

## 2016-10-23 MED ORDER — HALOPERIDOL LACTATE 5 MG/ML IJ SOLN: 2.0000 mg | Freq: Once | INTRAMUSCULAR | Status: DC

## 2016-10-23 MED ORDER — ENOXAPARIN SODIUM 40 MG/0.4ML ~~LOC~~ SOLN
40.0000 mg | SUBCUTANEOUS | Status: DC
  Filled 2016-10-23 (×5): qty 0.4

## 2016-10-23 MED ORDER — POTASSIUM CHLORIDE IN NACL 20-0.9 MEQ/L-% IV SOLN
INTRAVENOUS | Status: DC
  Administered 2016-10-24: 01:00:00 via INTRAVENOUS
  Administered 2016-10-24: 1000 mL via INTRAVENOUS
  Administered 2016-10-24: 10:00:00 via INTRAVENOUS
  Administered 2016-10-25: 1000 mL via INTRAVENOUS
  Administered 2016-10-26 – 2016-10-27 (×5): via INTRAVENOUS
  Filled 2016-10-23 (×12): qty 1000

## 2016-10-23 MED ORDER — ACETAMINOPHEN 650 MG RE SUPP: 650.0000 mg | Freq: Four times a day (QID) | RECTAL | Status: DC | PRN

## 2016-10-23 MED ORDER — ACETAMINOPHEN 325 MG PO TABS
650.0000 mg | ORAL_TABLET | Freq: Four times a day (QID) | ORAL | Status: DC | PRN
  Administered 2016-10-25: 650 mg via ORAL
  Filled 2016-10-23 (×2): qty 2

## 2016-10-23 MED ORDER — OXYCODONE HCL 5 MG PO TABS
5.0000 mg | ORAL_TABLET | ORAL | Status: DC | PRN
  Administered 2016-10-24 – 2016-10-28 (×14): 5 mg via ORAL
  Filled 2016-10-23 (×16): qty 1

## 2016-10-23 NOTE — ED Triage Notes (Signed)
Patient here with recurrent epigastric pain. Seen a week ago and had negative labs, negative u/s, and negative CT. States that she is unable to eat due to the pain and vomiting. Sent by her GI MD for possibly endo after eval

## 2016-10-23 NOTE — H&P (Signed)
History and Physical  Patient Name: [name]     [id number]    DOB: Sep 13, 1976    DOA: 10/23/2016 PCP: Philis Fendt, MD   Patient coming from: Home  Chief Complaint: Dysphagia, vomiting for weeks  HPI: [name] is a 40 y.o. female with a past medical history significant for HIV nonadherent, esophageal stricture, Bipolar disorder not currently in treatment, and asthma who presents with 2 months worsening epigastric pain and dysphagia.  State of health until about 9 weeks ago when she started to notice mild and intermittent epigastric discomfort. She saw her PCP, who continued her pantoprazole twice a day, but despite this her symptoms progressed. First it spread to her right upper quadrant radiating to the back, and became more severe. She treated it with a heating pad and some leftover Norco, but continued to progress. Now the last 3 weeks, she has been in a lot more severe pain, all the time, and associated with nausea, difficulty swallowing, inability to take much by mouth.  Within the last 2 weeks, she saw her gastroenterologist at [facility], who thought that the differential included recurrence of her stricture, pancreatitis, or gallbladder disease. Subsequently she was seen in the emergency room, where CT of the abdomen and pelvis and lipase were unremarkable, right upper quadrant ultrasound was normal, and she was referred back to GI.  Since then, she reports being unable to eat anything.  She vomits anything she takes by mouth, and is now weak and tired.  Today she called Dr. Kathline Magic office and they recommended that she present to the hospital for likely EGD.    ED course: -Afebrile, heart rate 110s, respirations and pulse oximetry normal, blood pressure 124/93 -Na 143, K 3.6, Cr 0.8, HCO3 19, AG normal, WBC 5.5K, Hgb 10 and microcytic, lipase normal, pregnancy test negative, urinalysis with ketones and granular casts, no pyuria or hematuria -The case was discussed with  equal GI who recommended nothing by mouth admission for possible EGD tomorrow   The patient's last appointment with ID was over a year ago. At that time her CD4 count was 20, and she was taking her Genvoya intermittently. Since then she says she has not been taking her Genvoya at all for several months at least, she is not specific. She says that she can't swallow the pill because it is too big and she has dysphagia.   She also is not following regularly with a mental health provider, nor taking her Zyprexa.         ROS: Review of Systems  Constitutional: Positive for malaise/fatigue and weight loss. Negative for chills and fever.  HENT: Positive for congestion.   Respiratory: Negative for cough, sputum production, shortness of breath and wheezing.   Gastrointestinal: Positive for abdominal pain, nausea and vomiting. Negative for blood in stool, constipation, diarrhea and melena.  Skin: Negative for rash.  All other systems reviewed and are negative.         Past Medical History:  Diagnosis Date  . Acute psychosis 03/10/12   2nd admission in last wk for this  . Angio-edema   . Anxiety   . Asthma    inhaler 2xday  . Bipolar disorder ([place])   . Depression   . GERD (gastroesophageal reflux disease)   . Hepatitis B    /E-chart  . History of blood transfusion   . HIV positive ([place])   . Microcytic anemia    h/o per E-chart  . Noncompliance with medication  regimen    /e-chart  . 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 Pyelonephritis    h/o per E-chart  . Shortness of breath dyspnea    due to Asthma  . Thyroid disease   . Urticaria     Past Surgical History:  Procedure Laterality Date  . BALLOON DILATION N/A 02/14/2015   Procedure: BALLOON DILATION;  Surgeon: Lear Ng, MD;  Location: [facility] ENDOSCOPY;  Service: Endoscopy;  Laterality: N/A;  . ESOPHAGOGASTRODUODENOSCOPY N/A 07/13/2013   Procedure:  ESOPHAGOGASTRODUODENOSCOPY (EGD);  Surgeon: Beryle Beams, MD;  Location: [facility] ENDOSCOPY;  Service: Endoscopy;  Laterality: N/A;  . ESOPHAGOGASTRODUODENOSCOPY N/A 08/12/2014   Procedure: ESOPHAGOGASTRODUODENOSCOPY (EGD);  Surgeon: Milus Banister, MD;  Location: [facility] ENDOSCOPY;  Service: Endoscopy;  Laterality: N/A;  . ESOPHAGOGASTRODUODENOSCOPY N/A 02/14/2015   Procedure: ESOPHAGOGASTRODUODENOSCOPY (EGD);  Surgeon: Lear Ng, MD;  Location: [facility] ENDOSCOPY;  Service: Endoscopy;  Laterality: N/A;  . ESOPHAGOGASTRODUODENOSCOPY (EGD) WITH PROPOFOL N/A 04/11/2015   Procedure: ESOPHAGOGASTRODUODENOSCOPY (EGD) WITH PROPOFOL;  Surgeon: Wilford Corner, MD;  Location: [facility] ENDOSCOPY;  Service: Endoscopy;  Laterality: N/A;  . ESOPHAGOGASTRODUODENOSCOPY (EGD) WITH PROPOFOL N/A 09/11/2015   Procedure: ESOPHAGOGASTRODUODENOSCOPY (EGD) WITH PROPOFOL;  Surgeon: Wilford Corner, MD;  Location: [facility] ENDOSCOPY;  Service: Endoscopy;  Laterality: N/A;  . SAVORY DILATION N/A 02/14/2015   Procedure: SAVORY DILATION;  Surgeon: Lear Ng, MD;  Location: [place];  Service: Endoscopy;  Laterality: N/A;  no xray needed  . SAVORY DILATION N/A 04/11/2015   Procedure: SAVORY DILATION;  Surgeon: Wilford Corner, MD;  Location: [facility] ENDOSCOPY;  Service: Endoscopy;  Laterality: N/A;  . SAVORY DILATION N/A 09/11/2015   Procedure: SAVORY DILATION;  Surgeon: Wilford Corner, MD;  Location: [facility] ENDOSCOPY;  Service: Endoscopy;  Laterality: N/A;  . THYROID LOBECTOMY  08/2002   left & isthmectomy; for benign thyroid adenoma/E-chart  . THYROID SURGERY    . TUBAL LIGATION  07/2002   /E-chart    Social History: Patient lives with her two children, boys, age 91 and 39 (at the [employer] [facility] [employer], respectively).  They provide transportation for her.  She does not work.  The patient walks unassisted. She is not a smoker.    Allergies  Allergen Reactions  . Other Hives and Itching    Berries, TREE NUTS  . Bactrim Itching    . Orange Fruit Itching  . Peanut-Containing Drug Products Hives  . Shellfish Allergy Hives  . Sulfa Antibiotics Itching  . Dapsone Itching and Rash    Family history: family history includes Asthma in her son; Cancer in her mother; Diabetes in her father, mother, and sister; Heart disease in her father; Urticaria in her sister.  Prior to Admission medications   Medication Sig Start Date End Date Taking? Authorizing Provider  pantoprazole (PROTONIX) 40 MG tablet Take 1 tablet (40 mg total) by mouth 2 (two) times daily. 12/12/14  Yes Theodis Blaze, MD  albuterol (PROAIR HFA) 108 (90 BASE) MCG/ACT inhaler Inhale 2 puffs into the lungs every 4 (four) hours as needed for wheezing or shortness of breath (cough). 11/16/15   Roselyn Malachy Moan, MD  beclomethasone (QVAR) 80 MCG/ACT inhaler Inhale 2 puffs into the lungs daily. Patient not taking: Reported on 10/23/2016 06/26/16   Gean Quint, MD  benztropine (COGENTIN) 1 MG tablet Take 1 tablet (1 mg total) by mouth daily. Patient not taking: Reported on 10/23/2016 08/20/14   Theodis Blaze, MD  cetirizine (ZYRTEC) 10 MG  tablet Take 1 tablet (10 mg total) by mouth daily. Patient not taking: Reported on 10/23/2016 06/26/16   Gean Quint, MD  elvitegravir-cobicistat-emtricitabine-tenofovir (GENVOYA) 150-150-200-10 MG TABS tablet Take 1 tablet by mouth daily with breakfast. Patient not taking: Reported on 10/23/2016 01/13/15   Michel Bickers, MD  EPINEPHrine 0.3 mg/0.3 mL IJ SOAJ injection Inject 0.3 mLs (0.3 mg total) into the muscle once. 12/20/15   Roselyn Malachy Moan, MD  metoCLOPramide (REGLAN) 10 MG tablet Take 1 tablet (10 mg total) by mouth every 6 (six) hours as needed for nausea. Patient not taking: Reported on 10/23/2016 10/17/16   Roxanna Mew, PA-C  metroNIDAZOLE (FLAGYL) 500 MG tablet Take 1 tablet (500 mg total) by mouth 2 (two) times daily. Patient not taking: Reported on 10/23/2016 10/17/16 10/24/16  Roxanna Mew, PA-C  OLANZapine  (ZYPREXA) 5 MG tablet TAKE 1 TABLET BY MOUTH AT BEDTIME Patient not taking: Reported on 10/23/2016 03/22/15   Michel Bickers, MD  oxyCODONE (OXY IR/ROXICODONE) 5 MG immediate release tablet Take 1 tablet (5 mg total) by mouth every 6 (six) hours as needed for severe pain. Patient not taking: Reported on 10/23/2016 10/17/16   Roxanna Mew, PA-C       Physical Exam: BP 124/93 (BP Location: Left Arm)   Pulse 115   Temp 98.1 F (36.7 C) (Oral)   Resp 16   Ht 6' (1.829 m)   Wt 77.1 kg (170 lb)   LMP 09/25/2016 Comment: Upreg neg 10/16/16  SpO2 100%   BMI 23.06 kg/m  General appearance: Thin tired appearing adult female, alert and in no acute distress.   Eyes: Anicteric, conjunctiva pink, lids and lashes normal. PERRL.    ENT: No nasal deformity, discharge, epistaxis.  Hearing normal. OP moist without lesions.  I do not appreciate oral/pharyngeal lesions. Neck: No neck masses.  Trachea midline.  No thyromegaly/tenderness. Lymph: No cervical or supraclavicular lymphadenopathy. Skin: Warm and dry. Mild tenting of skin. No jaundice.  No suspicious rashes or lesions. Cardiac: RRR, nl S1-S2, no murmurs appreciated.  Capillary refill is brisk.  JVP normal.  No LE edema.  Radial and DP pulses faint, symmetric. Respiratory: Normal respiratory rate and rhythm.  CTAB without rales or wheezes. Abdomen: Abdomen soft.  Mild epigastric TTP, voluntary guarding, no rebound or rigidity. No ascites, distension, hepatosplenomegaly.   MSK: No deformities or effusions.  No cyanosis or clubbing. Neuro: Cranial nerves normal.  Sensation intact to light touch. Speech is fluent.  Muscle strength normal.    Psych: Sensorium intact and responding to questions, attention normal.  Behavior appropriate.  Affect blunted.  Judgment and insight appear normal.     Labs on Admission:  I have personally reviewed following labs and imaging studies: CBC:  Recent Labs Lab 10/23/16 1453  WBC 5.5  HGB 10.0*  HCT 31.1*   MCV 69.7*  PLT [id number]   Basic Metabolic Panel:  Recent Labs Lab 10/23/16 1453  NA 143  K 3.6  CL 111  CO2 19*  GLUCOSE 102*  BUN 15  CREATININE 0.80  CALCIUM 9.8   GFR: Estimated Creatinine Clearance: 109 mL/min (by C-G formula based on SCr of 0.8 mg/dL).  Liver Function Tests:  Recent Labs Lab 10/23/16 1453  AST 56*  ALT 43  ALKPHOS 93  BILITOT 0.7  PROT 7.7  ALBUMIN 4.0    Recent Labs Lab 10/23/16 1453  LIPASE 26   No results for input(s): AMMONIA in the last 168 hours. Coagulation Profile: No results  for input(s): INR, PROTIME in the last 168 hours. Cardiac Enzymes: No results for input(s): CKTOTAL, CKMB, CKMBINDEX, TROPONINI in the last 168 hours. BNP (last 3 results) No results for input(s): PROBNP in the last 8760 hours. HbA1C: No results for input(s): HGBA1C in the last 72 hours. CBG: No results for input(s): GLUCAP in the last 168 hours. Lipid Profile: No results for input(s): CHOL, HDL, LDLCALC, TRIG, CHOLHDL, LDLDIRECT in the last 72 hours. Thyroid Function Tests: No results for input(s): TSH, T4TOTAL, FREET4, T3FREE, THYROIDAB in the last 72 hours. Anemia Panel: No results for input(s): VITAMINB12, FOLATE, FERRITIN, TIBC, IRON, RETICCTPCT in the last 72 hours. Sepsis Labs: Invalid input(s): PROCALCITONIN, LACTICIDVEN Recent Results (from the past 240 hour(s))  Wet prep, genital     Status: Abnormal   Collection Time: 10/16/16 11:36 PM  Result Value Ref Range Status   Yeast Wet Prep HPF POC NONE SEEN NONE SEEN Final   Trich, Wet Prep NONE SEEN NONE SEEN Final   Clue Cells Wet Prep HPF POC PRESENT (A) NONE SEEN Final   WBC, Wet Prep HPF POC MANY (A) NONE SEEN Final   Sperm NONE SEEN  Final         Radiological Exams on Admission: Personally reviewed: No results found.  EKG: Independently reviewed ECG from 1 week ago showed normal QTc.  CT abdomen and pelvis with contrast from 1 week ago showed no abnormality of the stomach,  intestines, kidneys, pancreas, liver, or colon.  RUQ [place] of 1 week ago showed no evidence of cholecystitis.        Assessment/Plan Principal Problem:   Nausea & vomiting Active Problems:   Human immunodeficiency virus (HIV) disease (HCC)   Bipolar disorder ([place])   Esophageal stricture   Dysphagia  1. N/V and dysphagia:  Differential includes infectious and mechanical esophageal disease in this patient who is noncompliant with ART.   -Consultation to GI, appreciate cares -NPO for EGD -MIVF -Continue PPI BID -Zofran and pain medication PRN     2. HIV:  Noncompliant with medications.  Last CD4 count 20 in Spring 2016.  No ID follow up since then.  Not taking Genvoya >3 months.  Previously on atovaquone for Ppx and acyclovir or valacyclovir, not clear. -Consult to ID to direct testing if needed during EGD, also re: restarting AIDS ppx therapies -Restart Genvoya (she may refuse to swallow) -Check HIV RNA quant and CD4 count  3. Anemia:  New. Microcytic. -Check ferritin and iron stores -Start iron if low  4. NAGMA:  Presumed from vomiting. -Monitor BMP             DVT prophylaxis: Lovenox  Code Status: FULL  Family Communication: None present  Disposition Plan: Anticipate IV fluids and NPO for GI consultation tomorrow.  ID consult to guide restarting ART. Consults called: GI overnight, ID not contacted Admission status: OBS, med surg At the point of initial evaluation, it is my clinical opinion that admission for OBSERVATION is reasonable and necessary because the patient's presenting complaints in the context of their chronic conditions represent sufficient risk of deterioration or significant morbidity to constitute reasonable grounds for close observation in the hospital setting, but that the patient may be medically stable for discharge from the hospital within 24 to 48 hours.    Medical decision making: Patient seen at 8:02 PM on 10/23/2016.  The patient  was discussed with Dr. Ellender Hose.  What exists of the patient's chart was reviewed in depth and summarized above.  Clinical condition: stable.        Edwin Dada [facility] Pager [phone]

## 2016-10-23 NOTE — ED Provider Notes (Signed)
[facility] DEPT Provider Note   CSN: [id number] Arrival date & time: 10/23/16  1439     History   Chief Complaint Chief Complaint  Patient presents with  . epigastric pain/ nausea    HPI [name] is a 40 y.o. female.  HPI 40 year old female with calm. Past medical history including HIV, history of candidal esophagitis, who presents with persistent nausea and vomiting. Patient has a history of esophageal strictures well. The patient states that over the last 3 weeks, she has had progressive worsening epigastric pain, nausea, and vomiting. She describes severe, burning, epigastric and upper chest pain with any swallowing. She has been unable to tolerate by mouth for the last 48 hours. Of note, she also recently decreased her Diflucan days. She states her symptoms feel summer to her previous stricture. She notified her GI physician who advised her to present to the ED for repeat scope and admission. Denies any fevers or chills. She states she has been otherwise adherent with her medications.  Past Medical History:  Diagnosis Date  . Acute psychosis 03/10/12   2nd admission in last wk for this  . Angio-edema   . Anxiety   . Asthma    inhaler 2xday  . Bipolar disorder ([place])   . Depression   . GERD (gastroesophageal reflux disease)   . Hepatitis B    /E-chart  . History of blood transfusion   . HIV positive ([place])   . Microcytic anemia    h/o per E-chart  . Noncompliance with medication regimen    /e-chart  . 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 Pyelonephritis    h/o per E-chart  . Shortness of breath dyspnea    due to Asthma  . Thyroid disease   . Urticaria     Patient Active Problem List   Diagnosis Date Noted  . Dysphagia 09/11/2015  . Salmonella bacteremia   . Esophageal stricture 12/06/2014  . Protein-calorie malnutrition, severe ([place]) 12/06/2014  . Constipation 10/04/2014  . CIN I (cervical  intraepithelial neoplasia I) 01/11/2014  . 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 Elevated lipase 07/10/2013  . Elevated alkaline phosphatase level 07/10/2013  . Esophageal ulcer 07/10/2013  . Bipolar disorder ([place]) 04/01/2012  . Human immunodeficiency virus (HIV) disease ([place]) 02/28/2009  . GENITAL HERPES 02/28/2009  . Microcytic anemia 02/27/2009    Past Surgical History:  Procedure Laterality Date  . BALLOON DILATION N/A 02/14/2015   Procedure: BALLOON DILATION;  Surgeon: Lear Ng, MD;  Location: [facility] ENDOSCOPY;  Service: Endoscopy;  Laterality: N/A;  . ESOPHAGOGASTRODUODENOSCOPY N/A 07/13/2013   Procedure: ESOPHAGOGASTRODUODENOSCOPY (EGD);  Surgeon: Beryle Beams, MD;  Location: [facility] ENDOSCOPY;  Service: Endoscopy;  Laterality: N/A;  . ESOPHAGOGASTRODUODENOSCOPY N/A 08/12/2014   Procedure: ESOPHAGOGASTRODUODENOSCOPY (EGD);  Surgeon: Milus Banister, MD;  Location: [facility] ENDOSCOPY;  Service: Endoscopy;  Laterality: N/A;  . ESOPHAGOGASTRODUODENOSCOPY N/A 02/14/2015   Procedure: ESOPHAGOGASTRODUODENOSCOPY (EGD);  Surgeon: Lear Ng, MD;  Location: [facility] ENDOSCOPY;  Service: Endoscopy;  Laterality: N/A;  . ESOPHAGOGASTRODUODENOSCOPY (EGD) WITH PROPOFOL N/A 04/11/2015   Procedure: ESOPHAGOGASTRODUODENOSCOPY (EGD) WITH PROPOFOL;  Surgeon: Wilford Corner, MD;  Location: [facility] ENDOSCOPY;  Service: Endoscopy;  Laterality: N/A;  . ESOPHAGOGASTRODUODENOSCOPY (EGD) WITH PROPOFOL N/A 09/11/2015   Procedure: ESOPHAGOGASTRODUODENOSCOPY (EGD) WITH PROPOFOL;  Surgeon: Wilford Corner, MD;  Location: [facility] ENDOSCOPY;  Service: Endoscopy;  Laterality: N/A;  . SAVORY DILATION N/A 02/14/2015   Procedure: SAVORY DILATION;  Surgeon: Lear Ng,  MD;  Location: [facility] ENDOSCOPY;  Service: Endoscopy;  Laterality: N/A;  no xray needed  . SAVORY DILATION N/A 04/11/2015   Procedure: SAVORY DILATION;  Surgeon: Wilford Corner, MD;  Location: [facility] ENDOSCOPY;  Service: Endoscopy;  Laterality: N/A;  .  SAVORY DILATION N/A 09/11/2015   Procedure: SAVORY DILATION;  Surgeon: Wilford Corner, MD;  Location: [facility] ENDOSCOPY;  Service: Endoscopy;  Laterality: N/A;  . THYROID LOBECTOMY  08/2002   left & isthmectomy; for benign thyroid adenoma/E-chart  . THYROID SURGERY    . TUBAL LIGATION  07/2002   /E-chart    OB History    No data available       Home Medications    Prior to Admission medications   Medication Sig Start Date End Date Taking? Authorizing Provider  albuterol (PROAIR HFA) 108 (90 BASE) MCG/ACT inhaler Inhale 2 puffs into the lungs every 4 (four) hours as needed for wheezing or shortness of breath (cough). 11/16/15   Roselyn Malachy Moan, MD  beclomethasone (QVAR) 80 MCG/ACT inhaler Inhale 2 puffs into the lungs daily. Patient not taking: Reported on 10/23/2016 06/26/16   Gean Quint, MD  benztropine (COGENTIN) 1 MG tablet Take 1 tablet (1 mg total) by mouth daily. Patient not taking: Reported on 10/23/2016 08/20/14   Theodis Blaze, MD  cetirizine (ZYRTEC) 10 MG tablet Take 1 tablet (10 mg total) by mouth daily. Patient not taking: Reported on 10/23/2016 06/26/16   Gean Quint, MD  elvitegravir-cobicistat-emtricitabine-tenofovir (GENVOYA) 150-150-200-10 MG TABS tablet Take 1 tablet by mouth daily with breakfast. Patient not taking: Reported on 10/23/2016 01/13/15   Michel Bickers, MD  EPINEPHrine 0.3 mg/0.3 mL IJ SOAJ injection Inject 0.3 mLs (0.3 mg total) into the muscle once. 12/20/15   Roselyn Malachy Moan, MD  metoCLOPramide (REGLAN) 10 MG tablet Take 1 tablet (10 mg total) by mouth every 6 (six) hours as needed for nausea. Patient not taking: Reported on 10/23/2016 10/17/16   Roxanna Mew, PA-C  metroNIDAZOLE (FLAGYL) 500 MG tablet Take 1 tablet (500 mg total) by mouth 2 (two) times daily. Patient not taking: Reported on 10/23/2016 10/17/16 10/24/16  Roxanna Mew, PA-C  OLANZapine (ZYPREXA) 5 MG tablet TAKE 1 TABLET BY MOUTH AT BEDTIME Patient not taking: Reported on 10/23/2016  03/22/15   Michel Bickers, MD  oxyCODONE (OXY IR/ROXICODONE) 5 MG immediate release tablet Take 1 tablet (5 mg total) by mouth every 6 (six) hours as needed for severe pain. Patient not taking: Reported on 10/23/2016 10/17/16   Roxanna Mew, PA-C  pantoprazole (PROTONIX) 40 MG tablet Take 1 tablet (40 mg total) by mouth 2 (two) times daily. Patient not taking: Reported on 10/23/2016 12/12/14   Theodis Blaze, MD    Family History Family History  Problem Relation Age of Onset  . Cancer Mother   . Diabetes Mother   . Diabetes Father   . Heart disease Father   . Diabetes Sister   . Urticaria Sister   . Asthma Son   . Allergic rhinitis Neg Hx   . Eczema Neg Hx   . Immunodeficiency Neg Hx     Social History Social History  Substance Use Topics  . Smoking status: Never Smoker  . Smokeless tobacco: Never Used  . Alcohol use 1.2 oz/week    2 Glasses of wine per week     Comment: occ     Allergies   Other; Bactrim; Orange fruit; Peanut-containing drug products; Shellfish allergy; Sulfa antibiotics; and Dapsone  Review of Systems Review of Systems  Constitutional: Positive for fatigue. Negative for chills and fever.  HENT: Negative for congestion, rhinorrhea and sore throat.   Eyes: Negative for visual disturbance.  Respiratory: Negative for cough, shortness of breath and wheezing.   Cardiovascular: Negative for chest pain and leg swelling.  Gastrointestinal: Positive for abdominal pain, nausea and vomiting. Negative for diarrhea.  Genitourinary: Negative for dysuria, flank pain, vaginal bleeding and vaginal discharge.  Musculoskeletal: Negative for neck pain.  Skin: Negative for rash.  Allergic/Immunologic: Negative for immunocompromised state.  Neurological: Negative for syncope and headaches.  Hematological: Does not bruise/bleed easily.  All other systems reviewed and are negative.    Physical Exam Updated Vital Signs BP 124/93 (BP Location: Left Arm)   Pulse 115    Temp 98.1 F (36.7 C) (Oral)   Resp 16   Ht 6' (1.829 m)   Wt 170 lb (77.1 kg)   LMP 09/25/2016 Comment: Upreg neg 10/16/16  SpO2 100%   BMI 23.06 kg/m   Physical Exam  Constitutional: She is oriented to person, place, and time. She appears well-developed and well-nourished. She appears distressed.  HENT:  Head: Normocephalic and atraumatic.  Eyes: Conjunctivae are normal.  Neck: Neck supple.  Cardiovascular: Normal rate, regular rhythm and normal heart sounds.  Exam reveals no friction rub.   No murmur heard. Pulmonary/Chest: Effort normal and breath sounds normal. No respiratory distress. She has no wheezes. She has no rales.  Abdominal: Soft. She exhibits no distension. There is tenderness (Moderate, epigastric). There is guarding. There is no rebound.  Musculoskeletal: She exhibits no edema.  Neurological: She is alert and oriented to person, place, and time. She exhibits normal muscle tone.  Skin: Skin is warm. Capillary refill takes less than 2 seconds.  Psychiatric: She has a normal mood and affect.  Nursing note and vitals reviewed.    ED Treatments / Results  Labs (all labs ordered are listed, but only abnormal results are displayed) Labs Reviewed  COMPREHENSIVE METABOLIC PANEL - Abnormal; Notable for the following:       Result Value   CO2 19 (*)    Glucose, Bld 102 (*)    AST 56 (*)    All other components within normal limits  CBC - Abnormal; Notable for the following:    Hemoglobin 10.0 (*)    HCT 31.1 (*)    MCV 69.7 (*)    MCH 22.4 (*)    RDW 16.3 (*)    All other components within normal limits  LIPASE, BLOOD  URINALYSIS, ROUTINE W REFLEX MICROSCOPIC (NOT AT [facility])  I-STAT BETA HCG BLOOD, ED ([facility], [facility], [facility] ONLY)    EKG  EKG Interpretation None       Radiology No results found.  Procedures Procedures (including critical care time)  Medications Ordered in ED Medications  sodium chloride 0.9 % bolus 1,000 mL (not administered)    metoCLOPramide (REGLAN) injection 10 mg (not administered)  diphenhydrAMINE (BENADRYL) injection 25 mg (not administered)     Initial Impression / Assessment and Plan / ED Course  I have reviewed the triage vital signs and the nursing notes.  Pertinent labs & imaging results that were available during my care of the patient were reviewed by me and considered in my medical decision making (see chart for details).  Clinical Course     40 year old female with past medical history including HIV and candidal esophagitis with stricture who presents with nausea, vomiting, and inability to tolerate more  than 1-2 sips of water for the last week. On arrival, patient is dehydrated clinically, tachycardic, with normal blood pressure. Labwork shows dehydration with low bicarbonate, normal white count. Urinalysis consistent with significant dehydration with greater than 80 ketones and granular casts. Renal function is otherwise at baseline. Abdomen is soft and diffusely tender, but without signs of peritonitis. Primary suspicion is acute on chronic nausea and vomiting, with concern for possible recurrence of esophageal stricture versus candidal esophagitis. Must also consider primary gastric dysmotility issue. Given her significant dehydration and inability to tolerate by mouth, will admit to medicine. I discussed with [facility] on call. They will plan to scope the patient in the morning.  Final Clinical Impressions(s) / ED Diagnoses   Final diagnoses:  Intractable vomiting with nausea, unspecified vomiting type  Ketonuria  Acidemia  Dehydration  HIV (human immunodeficiency virus infection) ([place])     Duffy Bruce, MD 10/23/16 1944

## 2016-10-24 DIAGNOSIS — E43 Unspecified severe protein-calorie malnutrition: Secondary | ICD-10-CM | POA: Diagnosis present

## 2016-10-24 DIAGNOSIS — Z825 Family history of asthma and other chronic lower respiratory diseases: Secondary | ICD-10-CM | POA: Diagnosis not present

## 2016-10-24 DIAGNOSIS — Z833 Family history of diabetes mellitus: Secondary | ICD-10-CM | POA: Diagnosis not present

## 2016-10-24 DIAGNOSIS — B3789 Other sites of candidiasis: Secondary | ICD-10-CM | POA: Diagnosis present

## 2016-10-24 DIAGNOSIS — R112 Nausea with vomiting, unspecified: Secondary | ICD-10-CM | POA: Diagnosis present

## 2016-10-24 DIAGNOSIS — D638 Anemia in other chronic diseases classified elsewhere: Secondary | ICD-10-CM | POA: Diagnosis present

## 2016-10-24 DIAGNOSIS — E860 Dehydration: Secondary | ICD-10-CM | POA: Diagnosis present

## 2016-10-24 DIAGNOSIS — D34 Benign neoplasm of thyroid gland: Secondary | ICD-10-CM | POA: Diagnosis present

## 2016-10-24 DIAGNOSIS — K222 Esophageal obstruction: Principal | ICD-10-CM

## 2016-10-24 DIAGNOSIS — B370 Candidal stomatitis: Secondary | ICD-10-CM | POA: Diagnosis present

## 2016-10-24 DIAGNOSIS — B20 Human immunodeficiency virus [HIV] disease: Secondary | ICD-10-CM | POA: Diagnosis present

## 2016-10-24 DIAGNOSIS — J45909 Unspecified asthma, uncomplicated: Secondary | ICD-10-CM | POA: Diagnosis present

## 2016-10-24 DIAGNOSIS — K210 Gastro-esophageal reflux disease with esophagitis: Secondary | ICD-10-CM | POA: Diagnosis present

## 2016-10-24 DIAGNOSIS — D509 Iron deficiency anemia, unspecified: Secondary | ICD-10-CM | POA: Diagnosis present

## 2016-10-24 DIAGNOSIS — Z91010 Allergy to peanuts: Secondary | ICD-10-CM | POA: Diagnosis not present

## 2016-10-24 DIAGNOSIS — F319 Bipolar disorder, unspecified: Secondary | ICD-10-CM | POA: Diagnosis present

## 2016-10-24 DIAGNOSIS — Z9114 Patient's other noncompliance with medication regimen: Secondary | ICD-10-CM | POA: Diagnosis not present

## 2016-10-24 DIAGNOSIS — E872 Acidosis: Secondary | ICD-10-CM | POA: Diagnosis present

## 2016-10-24 DIAGNOSIS — Z6822 Body mass index (BMI) 22.0-22.9, adult: Secondary | ICD-10-CM | POA: Diagnosis not present

## 2016-10-24 DIAGNOSIS — K449 Diaphragmatic hernia without obstruction or gangrene: Secondary | ICD-10-CM | POA: Diagnosis present

## 2016-10-24 DIAGNOSIS — Z809 Family history of malignant neoplasm, unspecified: Secondary | ICD-10-CM | POA: Diagnosis not present

## 2016-10-24 DIAGNOSIS — Z882 Allergy status to sulfonamides status: Secondary | ICD-10-CM | POA: Diagnosis not present

## 2016-10-24 DIAGNOSIS — Z881 Allergy status to other antibiotic agents status: Secondary | ICD-10-CM | POA: Diagnosis not present

## 2016-10-24 DIAGNOSIS — Z8249 Family history of ischemic heart disease and other diseases of the circulatory system: Secondary | ICD-10-CM | POA: Diagnosis not present

## 2016-10-24 DIAGNOSIS — B3781 Candidal esophagitis: Secondary | ICD-10-CM | POA: Diagnosis present

## 2016-10-24 LAB — IRON AND TIBC
IRON: 18 ug/dL — AB (ref 28–170)
Saturation Ratios: 6 % — ABNORMAL LOW (ref 10.4–31.8)
TIBC: 288 ug/dL (ref 250–450)
UIBC: 270 ug/dL

## 2016-10-24 LAB — COMPREHENSIVE METABOLIC PANEL
ALBUMIN: 3.2 g/dL — AB (ref 3.5–5.0)
ALK PHOS: 69 U/L (ref 38–126)
ALT: 33 U/L (ref 14–54)
AST: 48 U/L — AB (ref 15–41)
Anion gap: 8 (ref 5–15)
BILIRUBIN TOTAL: 0.9 mg/dL (ref 0.3–1.2)
BUN: 12 mg/dL (ref 6–20)
CALCIUM: 8.8 mg/dL — AB (ref 8.9–10.3)
CO2: 23 mmol/L (ref 22–32)
CREATININE: 0.65 mg/dL (ref 0.44–1.00)
Chloride: 114 mmol/L — ABNORMAL HIGH (ref 101–111)
GFR calc Af Amer: >60 mL/min (ref >60)
GLUCOSE: 97 mg/dL (ref 65–99)
Potassium: 3.9 mmol/L (ref 3.5–5.1)
Sodium: 145 mmol/L (ref 135–145)
TOTAL PROTEIN: 6.8 g/dL (ref 6.5–8.1)

## 2016-10-24 LAB — CBC
HCT: 25.9 % — ABNORMAL LOW (ref 36.0–46.0)
Hemoglobin: 8.2 g/dL — ABNORMAL LOW (ref 12.0–15.0)
MCH: 22.3 pg — ABNORMAL LOW (ref 26.0–34.0)
MCHC: 31.7 g/dL (ref 30.0–36.0)
MCV: 70.6 fL — ABNORMAL LOW (ref 78.0–100.0)
PLATELETS: 186 10*3/uL (ref 150–400)
RBC: 3.67 MIL/uL — ABNORMAL LOW (ref 3.87–5.11)
RDW: 16.6 % — AB (ref 11.5–15.5)
WBC: 3.5 10*3/uL — AB (ref 4.0–10.5)

## 2016-10-24 LAB — FERRITIN: FERRITIN: 302 ng/mL (ref 11–307)

## 2016-10-24 LAB — T-HELPER CELLS (CD4) COUNT (NOT AT ARMC)
CD4 % Helper T Cell: 2 % — ABNORMAL LOW (ref 33–55)
CD4 T CELL ABS: 10 /uL — AB (ref 400–2700)

## 2016-10-24 MED ORDER — BOOST / RESOURCE BREEZE PO LIQD
1.0000 | Freq: Two times a day (BID) | ORAL | Status: DC
  Administered 2016-10-24 – 2016-10-28 (×5): 1 via ORAL

## 2016-10-24 NOTE — Consult Note (Signed)
Referring Provider: Dr. Charlies Silvers Primary Care Physician:  Philis Fendt, MD Primary Gastroenterologist:  Dr. Michail Sermon  Reason for Consultation:  Nausea, vomiting, dysphagia  HPI: [name] is a 40 y.o. female with HIV reportedly on antiretrovirals (in the office she reported compliance with her Jorje Guild but chart report on admit says she has not been taking that med for several months) although noncompliant with ID f/u who has a history of a tight inflammatory esophageal stricture that was last dilated in 08/2015 and history of Candida esophagitis. She has been having progressive nausea and vomiting that worsened this past Sunday when she was not able to tolerate any liquids or solids without vomiting a few minutes after swallowing them. Denies any sensation of food or liquid hanging up when she is swallowing but would start to vomit within 2-3 minutes of ingestion. Denies pain with swallowing. Having upper quadrant sharp abdominal pain this week as well. Moving solid nonbloody stools. Denies melena or hematochezia. Abd/pelvis CT scan last week unrevealing. CD4 count last year was 20, which was the last time she saw ID.  Past Medical History:  Diagnosis Date  . Acute psychosis 03/10/12   2nd admission in last wk for this  . Angio-edema   . Anxiety   . Asthma    inhaler 2xday  . Bipolar disorder ([place])   . Depression   . GERD (gastroesophageal reflux disease)   . Hepatitis B    /E-chart  . History of blood transfusion   . HIV positive ([place])   . Microcytic anemia    h/o per E-chart  . Noncompliance with medication regimen    /e-chart  . 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 Pyelonephritis    h/o per E-chart  . Shortness of breath dyspnea    due to Asthma  . Thyroid disease   . Urticaria     Past Surgical History:  Procedure Laterality Date  . BALLOON DILATION N/A 02/14/2015   Procedure: BALLOON DILATION;  Surgeon: Lear Ng, MD;  Location: [facility] ENDOSCOPY;  Service: Endoscopy;  Laterality: N/A;  . ESOPHAGOGASTRODUODENOSCOPY N/A 07/13/2013   Procedure: ESOPHAGOGASTRODUODENOSCOPY (EGD);  Surgeon: Beryle Beams, MD;  Location: [facility] ENDOSCOPY;  Service: Endoscopy;  Laterality: N/A;  . ESOPHAGOGASTRODUODENOSCOPY N/A 08/12/2014   Procedure: ESOPHAGOGASTRODUODENOSCOPY (EGD);  Surgeon: Milus Banister, MD;  Location: [facility] ENDOSCOPY;  Service: Endoscopy;  Laterality: N/A;  . ESOPHAGOGASTRODUODENOSCOPY N/A 02/14/2015   Procedure: ESOPHAGOGASTRODUODENOSCOPY (EGD);  Surgeon: Lear Ng, MD;  Location: [facility] ENDOSCOPY;  Service: Endoscopy;  Laterality: N/A;  . ESOPHAGOGASTRODUODENOSCOPY (EGD) WITH PROPOFOL N/A 04/11/2015   Procedure: ESOPHAGOGASTRODUODENOSCOPY (EGD) WITH PROPOFOL;  Surgeon: Wilford Corner, MD;  Location: [facility] ENDOSCOPY;  Service: Endoscopy;  Laterality: N/A;  . ESOPHAGOGASTRODUODENOSCOPY (EGD) WITH PROPOFOL N/A 09/11/2015   Procedure: ESOPHAGOGASTRODUODENOSCOPY (EGD) WITH PROPOFOL;  Surgeon: Wilford Corner, MD;  Location: [facility] ENDOSCOPY;  Service: Endoscopy;  Laterality: N/A;  . SAVORY DILATION N/A 02/14/2015   Procedure: SAVORY DILATION;  Surgeon: Lear Ng, MD;  Location: [facility];  Service: Endoscopy;  Laterality: N/A;  no xray needed  . SAVORY DILATION N/A 04/11/2015   Procedure: SAVORY DILATION;  Surgeon: Wilford Corner, MD;  Location: [facility] ENDOSCOPY;  Service: Endoscopy;  Laterality: N/A;  . SAVORY DILATION N/A 09/11/2015   Procedure: SAVORY DILATION;  Surgeon: Wilford Corner, MD;  Location: [facility] ENDOSCOPY;  Service: Endoscopy;  Laterality: N/A;  . THYROID LOBECTOMY  08/2002   left & isthmectomy; for benign thyroid adenoma/E-chart  .  THYROID SURGERY    . TUBAL LIGATION  07/2002   /E-chart    Prior to Admission medications   Medication Sig Start Date End Date Taking? Authorizing Provider  pantoprazole (PROTONIX) 40 MG tablet Take 1 tablet (40 mg total) by mouth 2 (two) times daily. 12/12/14   Yes Theodis Blaze, MD  albuterol (PROAIR HFA) 108 (90 BASE) MCG/ACT inhaler Inhale 2 puffs into the lungs every 4 (four) hours as needed for wheezing or shortness of breath (cough). 11/16/15   Roselyn Malachy Moan, MD  beclomethasone (QVAR) 80 MCG/ACT inhaler Inhale 2 puffs into the lungs daily. Patient not taking: Reported on 10/23/2016 06/26/16   Gean Quint, MD  benztropine (COGENTIN) 1 MG tablet Take 1 tablet (1 mg total) by mouth daily. Patient not taking: Reported on 10/23/2016 08/20/14   Theodis Blaze, MD  cetirizine (ZYRTEC) 10 MG tablet Take 1 tablet (10 mg total) by mouth daily. Patient not taking: Reported on 10/23/2016 06/26/16   Gean Quint, MD  elvitegravir-cobicistat-emtricitabine-tenofovir (GENVOYA) 150-150-200-10 MG TABS tablet Take 1 tablet by mouth daily with breakfast. Patient not taking: Reported on 10/23/2016 01/13/15   Michel Bickers, MD  EPINEPHrine 0.3 mg/0.3 mL IJ SOAJ injection Inject 0.3 mLs (0.3 mg total) into the muscle once. 12/20/15   Roselyn Malachy Moan, MD  metoCLOPramide (REGLAN) 10 MG tablet Take 1 tablet (10 mg total) by mouth every 6 (six) hours as needed for nausea. Patient not taking: Reported on 10/23/2016 10/17/16   Roxanna Mew, PA-C  metroNIDAZOLE (FLAGYL) 500 MG tablet Take 1 tablet (500 mg total) by mouth 2 (two) times daily. Patient not taking: Reported on 10/23/2016 10/17/16 10/24/16  Roxanna Mew, PA-C  OLANZapine (ZYPREXA) 5 MG tablet TAKE 1 TABLET BY MOUTH AT BEDTIME Patient not taking: Reported on 10/23/2016 03/22/15   Michel Bickers, MD  oxyCODONE (OXY IR/ROXICODONE) 5 MG immediate release tablet Take 1 tablet (5 mg total) by mouth every 6 (six) hours as needed for severe pain. Patient not taking: Reported on 10/23/2016 10/17/16   Roxanna Mew, PA-C    Scheduled Meds: . elvitegravir-cobicistat-emtricitabine-tenofovir  1 tablet Oral Q breakfast  . enoxaparin (LOVENOX) injection  40 mg Subcutaneous Q24H  . pantoprazole  40 mg Oral BID    Continuous Infusions: . 0.9 % NaCl with KCl 20 mEq / L 125 mL/hr at 10/24/16 0045   PRN Meds:.acetaminophen **OR** acetaminophen, HYDROmorphone (DILAUDID) injection, ondansetron **OR** ondansetron (ZOFRAN) IV, oxyCODONE  Allergies as of 10/23/2016 - Review Complete 10/23/2016  Allergen Reaction Noted  . Other Hives and Itching 09/08/2015  . Bactrim Itching 11/05/2011  . Orange fruit Itching 02/20/2012  . Peanut-containing drug products Hives 11/05/2011  . Shellfish allergy Hives 11/05/2011  . Sulfa antibiotics Itching 11/01/2014  . Dapsone Itching and Rash 06/18/2013    Family History  Problem Relation Age of Onset  . Cancer Mother   . Diabetes Mother   . Diabetes Father   . Heart disease Father   . Diabetes Sister   . Urticaria Sister   . Asthma Son   . Allergic rhinitis Neg Hx   . Eczema Neg Hx   . Immunodeficiency Neg Hx     Social History   Social History  . Marital status: Single    Spouse name: N/A  . Number of children: N/A  . Years of education: N/A   Occupational History  . Not on file.   Social History Main Topics  . Smoking status: Never Smoker  .  Smokeless tobacco: Never Used  . Alcohol use 1.2 oz/week    2 Glasses of wine per week     Comment: occ  . Drug use: No  . Sexual activity: No     Comment: declined condoms   Other Topics Concern  . Not on file   Social History Narrative  . No narrative on file    Review of Systems: All negative except as stated above in HPI.  Physical Exam: Vital signs: Vitals:   10/24/16 0441 10/24/16 0817  BP: 111/72 113/72  Pulse: 70 76  Resp: 19 17  Temp: 97.7 F (36.5 C) 98 F (36.7 C)   Last BM Date: 10/23/16 General:  Lethargic, thin, mild acute distress HEENT: anicteric sclera Neck: supple, nontender Lungs:  Clear throughout to auscultation.   No wheezes, crackles, or rhonchi. No acute distress. Heart:  Regular rate and rhythm; no murmurs, clicks, rubs,  or gallops. Abdomen: upper quadrant  tenderness with guarding, soft, nondistended, +BS  Rectal:  Deferred Ext: no edema  GI:  Lab Results:  Recent Labs  10/23/16 1453 10/24/16 0519  WBC 5.5 3.5*  HGB 10.0* 8.2*  HCT 31.1* 25.9*  PLT 236 186   BMET  Recent Labs  10/23/16 1453 10/24/16 0519  NA 143 145  K 3.6 3.9  CL 111 114*  CO2 19* 23  GLUCOSE 102* 97  BUN 15 12  CREATININE 0.80 0.65  CALCIUM 9.8 8.8*   LFT  Recent Labs  10/24/16 0519  PROT 6.8  ALBUMIN 3.2*  AST 48*  ALT 33  ALKPHOS 69  BILITOT 0.9   PT/INR No results for input(s): LABPROT, INR in the last 72 hours.   Studies/Results: No results found.  Impression/Plan: Severe nausea/vomiting in the setting of HIV (noncompliance and low CD4 count last year) - symptoms concerning for infectious esophagitis vs recurrent esophageal stricture vs esophageal ulcer vs peptic ulcer. Clear liquid diet. NPO after midnight. EGD with possible dilation tomorrow morning to further evaluate. Likely will need ID f/u while inpatient for HIV management for defer to primary team (updated CD4 count pending). PPI PO BID ok. Supportive care.    LOS: 0 days   Ellsworth C.  10/24/2016, 9:26 AM  Pager [phone]  If no answer or after 5 PM call [phone]

## 2016-10-24 NOTE — Progress Notes (Signed)
Patient ID: [name], female   DOB: [birth year], 40 y.o.   MRN: [id number]  PROGRESS NOTE    LUANDA KIRSCHENMAN  [id number] DOB: Jan 09, 1976 DOA: 10/23/2016  PCP: Philis Fendt, MD   Brief Narrative:  40 y.o. female with HIV reportedly on antiretrovirals (questionabl compliance with Genvoya), history of tight inflammatory esophageal stricture that was last dilated in 08/2015 and history of Candida esophagitis. She has been having progressive nausea and vomiting that worsened over past 4 days prior to this admission to the point she was not able to tolerate any liquids or solids without vomiting a few minutes after swallowing them. No reports of sensation of food stuck in the chest, no pain or difficulty swallowing. Pt had CT abdomen 10/16/2016 which did not show acute abnormalities. GI has seen her in consultation.    Assessment & Plan:   Principal Problem:   Nausea & vomiting in patient with tight esophageal stricture - Appreciate GI recommendations - Plan for EGD tomorrow - She may have clear liquids today and NPO after midnight - Continue PPI therapy  - Continue IV fluids with hydration - Continue antiemetics as needed   Active Problems:   Human immunodeficiency virus (HIV) disease (HCC) - CD4 count pending - ART resumed     Microcytic anemia / Anemia of chronic disease - Hgb 8.2 this am - Continue to monitor CBC daily     Leukopenia - No neutropenia - Likely from HIV - Monitor daily CBC    DVT prophylaxis: Lovenox suBQ Code Status: full code  Family Communication: no family at the bedside this am Disposition Plan: home once cleared by GI   Consultants:   GI, Dr. Michail Sermon   Procedures:   Plan for EGD 11/10  Antimicrobials:   None     Subjective: No overnight events.   Objective: Vitals:   10/23/16 1443 10/23/16 2252 10/24/16 0441 10/24/16 0817  BP: 124/93 133/84 111/72 113/72  Pulse: 115 82 70 76  Resp: 16 18 19 17   Temp: 98.1 F (36.7 C)  97.6 F (36.4 C) 97.7 F (36.5 C) 98 F (36.7 C)  TempSrc: Oral Oral Oral Oral  SpO2: 100% 100% 100% 100%  Weight: 77.1 kg (170 lb) 76.9 kg (169 lb 8 oz)    Height: 6' (1.829 m) 6' (1.829 m)      Intake/Output Summary (Last 24 hours) at 10/24/16 1010 Last data filed at 10/24/16 0900  Gross per 24 hour  Intake           784.58 ml  Output                0 ml  Net           784.58 ml   Filed Weights   10/23/16 1443 10/23/16 2252  Weight: 77.1 kg (170 lb) 76.9 kg (169 lb 8 oz)    Examination:  General exam: Appears calm and comfortable  Respiratory system: Clear to auscultation. Respiratory effort normal. Cardiovascular system: S1 & S2 heard, RRR. No pedal edema. Gastrointestinal system: Abdomen is nondistended, soft and nontender. No organomegaly or masses felt. Normal bowel sounds heard. Central nervous system: Alert and oriented. No focal neurological deficits. Extremities: Symmetric 5 x 5 power. Skin: No rashes, lesions or ulcers Psychiatry: Judgement and insight appear normal. Mood & affect appropriate.   Data Reviewed: I have personally reviewed following labs and imaging studies  CBC:  Recent Labs Lab 10/23/16 1453 10/24/16 0519  WBC 5.5 3.5*  HGB  10.0* 8.2*  HCT 31.1* 25.9*  MCV 69.7* 70.6*  PLT 236 [id number]   Basic Metabolic Panel:  Recent Labs Lab 10/23/16 1453 10/24/16 0519  NA 143 145  K 3.6 3.9  CL 111 114*  CO2 19* 23  GLUCOSE 102* 97  BUN 15 12  CREATININE 0.80 0.65  CALCIUM 9.8 8.8*   GFR: Estimated Creatinine Clearance: 109 mL/min (by C-G formula based on SCr of 0.65 mg/dL). Liver Function Tests:  Recent Labs Lab 10/23/16 1453 10/24/16 0519  AST 56* 48*  ALT 43 33  ALKPHOS 93 69  BILITOT 0.7 0.9  PROT 7.7 6.8  ALBUMIN 4.0 3.2*    Recent Labs Lab 10/23/16 1453  LIPASE 26   No results for input(s): AMMONIA in the last 168 hours. Coagulation Profile: No results for input(s): INR, PROTIME in the last 168 hours. Cardiac  Enzymes: No results for input(s): CKTOTAL, CKMB, CKMBINDEX, TROPONINI in the last 168 hours. BNP (last 3 results) No results for input(s): PROBNP in the last 8760 hours. HbA1C: No results for input(s): HGBA1C in the last 72 hours. CBG: No results for input(s): GLUCAP in the last 168 hours. Lipid Profile: No results for input(s): CHOL, HDL, LDLCALC, TRIG, CHOLHDL, LDLDIRECT in the last 72 hours. Thyroid Function Tests: No results for input(s): TSH, T4TOTAL, FREET4, T3FREE, THYROIDAB in the last 72 hours. Anemia Panel:  Recent Labs  10/23/16 2311  FERRITIN 302  TIBC 288  IRON 18*   Urine analysis:    Component Value Date/Time   COLORURINE AMBER (A) 10/23/2016 1741   APPEARANCEUR CLOUDY (A) 10/23/2016 1741   LABSPEC 1.021 10/23/2016 1741   PHURINE 6.0 10/23/2016 1741   GLUCOSEU NEGATIVE 10/23/2016 1741   HGBUR SMALL (A) 10/23/2016 1741   BILIRUBINUR MODERATE (A) 10/23/2016 1741   KETONESUR >80 (A) 10/23/2016 1741   PROTEINUR 100 (A) 10/23/2016 1741   UROBILINOGEN 0.2 12/07/2014 1441   NITRITE NEGATIVE 10/23/2016 1741   LEUKOCYTESUR NEGATIVE 10/23/2016 1741   Sepsis Labs: @LABRCNTIP (procalcitonin:4,lacticidven:4)   Recent Results (from the past 240 hour(s))  Wet prep, genital     Status: Abnormal   Collection Time: 10/16/16 11:36 PM  Result Value Ref Range Status   Yeast Wet Prep HPF POC NONE SEEN NONE SEEN Final   Trich, Wet Prep NONE SEEN NONE SEEN Final   Clue Cells Wet Prep HPF POC PRESENT (A) NONE SEEN Final   WBC, Wet Prep HPF POC MANY (A) NONE SEEN Final   Sperm NONE SEEN  Final      Radiology Studies: No results found.   Scheduled Meds: . elvitegravir-cobicistat-emtricitabine-tenofovir  1 tablet Oral Q breakfast  . enoxaparin (LOVENOX) injection  40 mg Subcutaneous Q24H  . pantoprazole  40 mg Oral BID   Continuous Infusions: . 0.9 % NaCl with KCl 20 mEq / L 125 mL/hr at 10/24/16 0944     LOS: 0 days    Time spent: 25 minutes  Greater than 50%  of the time spent on counseling and coordinating the care.   Leisa Lenz, MD [facility] Pager [phone]  If 7PM-7AM, please contact night-coverage [email or web address] Password [email or web address] 10/24/2016, 10:10 AM

## 2016-10-24 NOTE — Progress Notes (Signed)
Initial Nutrition Assessment  DOCUMENTATION CODES:   Severe malnutrition in context of acute illness/injury  INTERVENTION:  Provide Boost Breeze po BID, each supplement provides 250 kcal and 9 grams of protein.  Monitor magnesium, potassium, and phosphorus daily for at least 3 days, MD to replete as needed, as pt is at risk for refeeding syndrome given severe malnutrition in acute illness and very little to no po intake x 7 days.  Encourage adequate PO intake.   NUTRITION DIAGNOSIS:   Malnutrition related to acute illness as evidenced by energy intake < or equal to 50% for > or equal to 5 days, moderate depletions of muscle mass.  GOAL:   Patient will meet greater than or equal to 90% of their needs  MONITOR:   PO intake, Supplement acceptance, Diet advancement, Labs, Weight trends, Skin, I & O's  REASON FOR ASSESSMENT:   Malnutrition Screening Tool    ASSESSMENT:   40 y.o. female with HIV reportedly on antiretrovirals (questionabl compliance with Genvoya), history of tight inflammatory esophageal stricture that was last dilated in 08/2015 and history of Candida esophagitis. She has been having progressive nausea and vomiting that worsened over past 4 days prior to this admission to the point she was not able to tolerate any liquids or solids without vomiting a few minutes after swallowing them.  Plans for EGD tomorrow.   Pt is currently on a clear liquid diet. Pt with abdominal pains during time of visit. She reports these pains have been present for 2 months. Pt reports the 1 week PTA pt with poor po intake as she reports n/v immediately after trying to consume fluids or solids. Pt at risk for refeeding syndrome due to severe malnutrition in acute illness and very little to no po intake for 7 days. Pt does report prior to the past 1 week, she usually eats fine with no other difficulties. Usual body weight reported to be ~178 lbs which she reports last weighed 2 weeks ago. Pt with a  5.1% weight loss in 2 weeks (not found significant for time frame). RD to order Boost Breeze to aid in caloric and protein needs.   Nutrition-Focused physical exam completed. Findings are no fat depletion, moderate muscle depletion, and no edema.   Labs and medications reviewed.   Diet Order:  Diet clear liquid Room service appropriate? Yes; Fluid consistency: Thin Diet NPO time specified  Skin:  Reviewed, no issues  Last BM:  11/8  Height:   Ht Readings from Last 1 Encounters:  10/23/16 6' (1.829 m)    Weight:   Wt Readings from Last 1 Encounters:  10/23/16 169 lb 8 oz (76.9 kg)    Ideal Body Weight:  72.7 kg  BMI:  Body mass index is 22.99 kg/m.  Estimated Nutritional Needs:   Kcal:  2000-2200  Protein:  95-105 grams  Fluid:  2- 2.2 L/day  EDUCATION NEEDS:   No education needs identified at this time  Corrin Parker, MS, RD, LDN Pager # [phone] After hours/ weekend pager # [phone]

## 2016-10-24 NOTE — Progress Notes (Signed)
Patient arrived to unit via stretcher from [facility]; alert and oriented with pain 10/10. Orders to be reviewed.

## 2016-10-25 ENCOUNTER — Inpatient Hospital Stay (HOSPITAL_COMMUNITY): Payer: Medicaid Other

## 2016-10-25 ENCOUNTER — Encounter (HOSPITAL_COMMUNITY): Payer: Self-pay

## 2016-10-25 ENCOUNTER — Inpatient Hospital Stay (HOSPITAL_COMMUNITY): Payer: Medicaid Other | Admitting: Certified Registered"

## 2016-10-25 ENCOUNTER — Encounter (HOSPITAL_COMMUNITY)
Admission: EM | Disposition: A | Discharge status: Home / Self Care | Payer: Self-pay | Source: Home / Self Care | Attending: Internal Medicine

## 2016-10-25 DIAGNOSIS — Z8249 Family history of ischemic heart disease and other diseases of the circulatory system: Secondary | ICD-10-CM

## 2016-10-25 DIAGNOSIS — Z882 Allergy status to sulfonamides status: Secondary | ICD-10-CM

## 2016-10-25 DIAGNOSIS — Z91010 Allergy to peanuts: Secondary | ICD-10-CM

## 2016-10-25 DIAGNOSIS — B3781 Candidal esophagitis: Secondary | ICD-10-CM | POA: Diagnosis present

## 2016-10-25 DIAGNOSIS — K219 Gastro-esophageal reflux disease without esophagitis: Secondary | ICD-10-CM | POA: Diagnosis present

## 2016-10-25 DIAGNOSIS — Z833 Family history of diabetes mellitus: Secondary | ICD-10-CM

## 2016-10-25 DIAGNOSIS — Z881 Allergy status to other antibiotic agents status: Secondary | ICD-10-CM

## 2016-10-25 DIAGNOSIS — J45909 Unspecified asthma, uncomplicated: Secondary | ICD-10-CM | POA: Diagnosis present

## 2016-10-25 DIAGNOSIS — E890 Postprocedural hypothyroidism: Secondary | ICD-10-CM

## 2016-10-25 DIAGNOSIS — Z9119 Patient's noncompliance with other medical treatment and regimen: Secondary | ICD-10-CM

## 2016-10-25 DIAGNOSIS — Z840 Family history of diseases of the skin and subcutaneous tissue: Secondary | ICD-10-CM

## 2016-10-25 DIAGNOSIS — E43 Unspecified severe protein-calorie malnutrition: Secondary | ICD-10-CM | POA: Insufficient documentation

## 2016-10-25 DIAGNOSIS — Z809 Family history of malignant neoplasm, unspecified: Secondary | ICD-10-CM

## 2016-10-25 DIAGNOSIS — Z883 Allergy status to other anti-infective agents status: Secondary | ICD-10-CM

## 2016-10-25 DIAGNOSIS — Z91018 Allergy to other foods: Secondary | ICD-10-CM

## 2016-10-25 DIAGNOSIS — J309 Allergic rhinitis, unspecified: Secondary | ICD-10-CM | POA: Diagnosis present

## 2016-10-25 DIAGNOSIS — Z91013 Allergy to seafood: Secondary | ICD-10-CM

## 2016-10-25 DIAGNOSIS — Z825 Family history of asthma and other chronic lower respiratory diseases: Secondary | ICD-10-CM

## 2016-10-25 DIAGNOSIS — Z832 Family history of diseases of the blood and blood-forming organs and certain disorders involving the immune mechanism: Secondary | ICD-10-CM

## 2016-10-25 DIAGNOSIS — Z9889 Other specified postprocedural states: Secondary | ICD-10-CM

## 2016-10-25 HISTORY — PX: BALLOON DILATION: SHX5330

## 2016-10-25 HISTORY — PX: ESOPHAGOGASTRODUODENOSCOPY: SHX5428

## 2016-10-25 LAB — BASIC METABOLIC PANEL
ANION GAP: 6 (ref 5–15)
BUN: 5 mg/dL — ABNORMAL LOW (ref 6–20)
CALCIUM: 8.2 mg/dL — AB (ref 8.9–10.3)
CO2: 21 mmol/L — ABNORMAL LOW (ref 22–32)
Chloride: 114 mmol/L — ABNORMAL HIGH (ref 101–111)
Creatinine, Ser: 0.67 mg/dL (ref 0.44–1.00)
Glucose, Bld: 100 mg/dL — ABNORMAL HIGH (ref 65–99)
POTASSIUM: 3.8 mmol/L (ref 3.5–5.1)
Sodium: 141 mmol/L (ref 135–145)

## 2016-10-25 LAB — CBC
HCT: 24.6 % — ABNORMAL LOW (ref 36.0–46.0)
Hemoglobin: 7.9 g/dL — ABNORMAL LOW (ref 12.0–15.0)
MCH: 22.6 pg — ABNORMAL LOW (ref 26.0–34.0)
MCHC: 32.1 g/dL (ref 30.0–36.0)
MCV: 70.3 fL — ABNORMAL LOW (ref 78.0–100.0)
Platelets: 165 K/uL (ref 150–400)
RBC: 3.5 MIL/uL — ABNORMAL LOW (ref 3.87–5.11)
RDW: 16.6 % — ABNORMAL HIGH (ref 11.5–15.5)
WBC: 3.8 K/uL — ABNORMAL LOW (ref 4.0–10.5)

## 2016-10-25 SURGERY — EGD (ESOPHAGOGASTRODUODENOSCOPY)
Anesthesia: Monitor Anesthesia Care

## 2016-10-25 MED ORDER — PROPOFOL 500 MG/50ML IV EMUL
INTRAVENOUS | Status: DC | PRN
  Administered 2016-10-25: 150 ug/kg/min via INTRAVENOUS

## 2016-10-25 MED ORDER — SULFAMETHOXAZOLE-TRIMETHOPRIM 800-160 MG PO TABS
1.0000 | ORAL_TABLET | Freq: Every day | ORAL | Status: DC
  Administered 2016-10-26: 1 via ORAL
  Filled 2016-10-25 (×2): qty 1

## 2016-10-25 MED ORDER — OXYCODONE HCL 5 MG PO TABS: 5.0000 mg | ORAL_TABLET | Freq: Once | ORAL | Status: DC | PRN

## 2016-10-25 MED ORDER — FENTANYL CITRATE (PF) 100 MCG/2ML IJ SOLN: 25.0000 ug | INTRAMUSCULAR | Status: DC | PRN

## 2016-10-25 MED ORDER — OXYCODONE HCL 5 MG/5ML PO SOLN: 5.0000 mg | Freq: Once | ORAL | Status: DC | PRN

## 2016-10-25 MED ORDER — LIDOCAINE HCL (CARDIAC) 20 MG/ML IV SOLN
INTRAVENOUS | Status: DC | PRN
  Administered 2016-10-25: 100 mg via INTRATRACHEAL

## 2016-10-25 MED ORDER — BUTAMBEN-TETRACAINE-BENZOCAINE 2-2-14 % EX AERO
INHALATION_SPRAY | CUTANEOUS | Status: DC | PRN
  Administered 2016-10-25: 2 via TOPICAL

## 2016-10-25 MED ORDER — ONDANSETRON HCL 4 MG/2ML IJ SOLN: 4.0000 mg | Freq: Once | INTRAMUSCULAR | Status: DC | PRN

## 2016-10-25 MED ORDER — PROPOFOL 10 MG/ML IV BOLUS
INTRAVENOUS | Status: DC | PRN
  Administered 2016-10-25: 80 mg via INTRAVENOUS
  Administered 2016-10-25: 60 mg via INTRAVENOUS
  Administered 2016-10-25: 20 mg via INTRAVENOUS
  Administered 2016-10-25: 40 mg via INTRAVENOUS

## 2016-10-25 MED ORDER — AZITHROMYCIN 600 MG PO TABS
1200.0000 mg | ORAL_TABLET | ORAL | Status: DC
  Administered 2016-10-25: 1200 mg via ORAL
  Filled 2016-10-25: qty 2

## 2016-10-25 MED ORDER — BENZTROPINE MESYLATE 1 MG PO TABS
1.0000 mg | ORAL_TABLET | Freq: Every day | ORAL | Status: DC
  Administered 2016-10-25 – 2016-10-28 (×4): 1 mg via ORAL
  Filled 2016-10-25 (×4): qty 1

## 2016-10-25 MED ORDER — OLANZAPINE 5 MG PO TABS
5.0000 mg | ORAL_TABLET | Freq: Every day | ORAL | Status: DC
  Administered 2016-10-25 – 2016-10-27 (×3): 5 mg via ORAL
  Filled 2016-10-25 (×3): qty 1

## 2016-10-25 MED ORDER — ESMOLOL HCL 100 MG/10ML IV SOLN
INTRAVENOUS | Status: DC | PRN
  Administered 2016-10-25: 40 mg via INTRAVENOUS
  Administered 2016-10-25: 30 mg via INTRAVENOUS

## 2016-10-25 MED ORDER — LACTATED RINGERS IV SOLN
INTRAVENOUS | Status: DC
  Administered 2016-10-25: 11:00:00 via INTRAVENOUS

## 2016-10-25 MED ORDER — FLUCONAZOLE IN SODIUM CHLORIDE 200-0.9 MG/100ML-% IV SOLN
200.0000 mg | INTRAVENOUS | Status: DC
  Administered 2016-10-25 – 2016-10-27 (×3): 200 mg via INTRAVENOUS
  Filled 2016-10-25 (×4): qty 100

## 2016-10-25 MED ORDER — SODIUM CHLORIDE 0.9 % IV SOLN: INTRAVENOUS | Status: DC

## 2016-10-25 NOTE — Consult Note (Signed)
[name] for Infectious Disease    Date of Admission:  10/23/2016          Reason for Consult: Advanced untreated HIV infection    Referring Physician: Dr. Norina Buzzard  Principal Problem:   Esophageal stricture Active Problems:   Nausea and vomiting   Candidal esophagitis (HCC)   Human immunodeficiency virus (HIV) disease (HCC)   Bipolar disorder (HCC)   GERD (gastroesophageal reflux disease)   Genital herpes   Microcytic anemia   Follicular adenoma of thyroid gland   Metabolic acidosis   Status post thyroidectomy   Asthma   Allergic rhinosinusitis   . [MAR Hold] elvitegravir-cobicistat-emtricitabine-tenofovir  1 tablet Oral Q breakfast  . [MAR Hold] enoxaparin (LOVENOX) injection  40 mg Subcutaneous Q24H  . [MAR Hold] feeding supplement  1 Container Oral BID BM  . [MAR Hold] pantoprazole  40 mg Oral BID    Recommendations: 1. Agree with restarting Genvoya 2. Start fluconazole 3. Restart Zyprexa and Cogentin 4. 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 Despite repeated efforts to reach out to her and get her back into our clinic she has continued to have a great deal of difficulty staying in care and on therapy for her HIV and bipolar disorder. When hospitalized in the past she is usually amenable to restarting therapy while in the hospital but then has not followed up in clinic. Nonetheless she is at extremely high risk for other opportunistic infections so I agree with restarting Genvoya now. I would also like to have her back on Zyprexa and Cogentin to try to stabilize her mood. She states that she has been wanting to get back to [facility] but was told that she would have to have a walk-in visit and that seems somewhat overwhelming to her. Ideally she should be on pneumocystis prophylaxis but she has had allergic reactions to trimethoprim sulfamethoxazole and dapsone in the past and is  extremely intolerant of atovaquone. With her asthma she is not a good candidate for outpatient aerosolized pentamidine.    HPI: [name] is a 40 y.o. female who is bipolar with long-standing HIV infection. She has had a great deal of difficulty staying in care and on therapies. She was last seen in my clinic in the spring of last year. She has been off of her Genvoya and Zyprexa ever since that time. She was readmitted yesterday with recurrent esophageal stricture causing dysphagia, nausea and vomiting. She underwent upper endoscopy this morning which revealed extensive esophageal candidiasis and a recurrent stricture that was dilated. She expresses that she would like to get back into my clinic. She has continued to have extensive mood swings. She recalls that she did feel better when she was on low-dose Zyprexa in the past. She is willing to restart it now.   Review of Systems: Review of Systems  Constitutional: Negative for chills, diaphoresis, fever, malaise/fatigue and weight loss.  HENT: Negative for congestion and sore throat.        Recent dysphagia with pills and solid food. Mild odynophagia.  Respiratory: Negative for cough, sputum production and shortness of breath.   Cardiovascular: Negative for chest pain.  Gastrointestinal: Positive for heartburn, nausea and vomiting. Negative for abdominal pain and diarrhea.  Genitourinary: Negative for dysuria and frequency.  Musculoskeletal: Negative for joint pain and myalgias.  Skin: Negative for rash.  Neurological: Negative for dizziness and headaches.  Psychiatric/Behavioral: Positive for depression. Negative for hallucinations, substance abuse and suicidal ideas. The patient is nervous/anxious.     Past Medical History:  Diagnosis Date  . Acute psychosis 03/10/12   2nd admission in last wk for this  . Angio-edema   . Anxiety   . Asthma    inhaler 2xday  . Bipolar disorder ([place])   . Depression   . GERD (gastroesophageal  reflux disease)   . Hepatitis B    /E-chart  . History of blood transfusion   . HIV positive ([place])   . Microcytic anemia    h/o per E-chart  . Noncompliance with medication regimen    /e-chart  . 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 Pyelonephritis    h/o per E-chart  . Shortness of breath dyspnea    due to Asthma  . Thyroid disease   . Urticaria     Social History  Substance Use Topics  . Smoking status: Never Smoker  . Smokeless tobacco: Never Used  . Alcohol use 1.2 oz/week    2 Glasses of wine per week     Comment: occ    Family History  Problem Relation Age of Onset  . Cancer Mother   . Diabetes Mother   . Diabetes Father   . Heart disease Father   . Diabetes Sister   . Urticaria Sister   . Asthma Son   . Allergic rhinitis Neg Hx   . Eczema Neg Hx   . Immunodeficiency Neg Hx    Allergies  Allergen Reactions  . Other Hives and Itching    Berries, TREE NUTS  . Bactrim Itching  . Orange Fruit Itching  . Peanut-Containing Drug Products Hives  . Shellfish Allergy Hives  . Sulfa Antibiotics Itching  . Dapsone Itching and Rash    OBJECTIVE: Blood pressure 135/86, pulse (!) 102, temperature 99.5 F (37.5 C), temperature source Oral, resp. rate 15, height 6' (1.829 m), weight 172 lb 3.2 oz (78.1 kg), last menstrual period 09/25/2016, SpO2 100 %.  Physical Exam  Constitutional: She is oriented to person, place, and time.  She is worse. She was somewhat tearful during the exam.  HENT:  Mouth/Throat: Oropharyngeal exudate present.  Eyes: Conjunctivae are normal.  Cardiovascular: Normal rate and regular rhythm.   No murmur heard. Pulmonary/Chest: Effort normal and breath sounds normal. She has no wheezes. She has no rales.  Abdominal: Soft. There is no tenderness.  Musculoskeletal: Normal range of motion.  Neurological: She is alert and oriented to person, place, and time.  Skin: No rash noted.    Psychiatric: Mood and affect normal.    Lab Results Lab Results  Component Value Date   WBC 3.8 (L) 10/25/2016   HGB 7.9 (L) 10/25/2016   HCT 24.6 (L) 10/25/2016   MCV 70.3 (L) 10/25/2016   PLT 165 10/25/2016    Lab Results  Component Value Date   CREATININE 0.67 10/25/2016   BUN 5 (L) 10/25/2016   NA 141 10/25/2016   K 3.8 10/25/2016   CL 114 (H) 10/25/2016   CO2 21 (L) 10/25/2016    Lab Results  Component Value Date   ALT 33 10/24/2016   AST 48 (H) 10/24/2016   ALKPHOS 69 10/24/2016   BILITOT 0.9 10/24/2016    HIV 1 RNA Quant (copies/mL)  Date Value  05/04/2015 515,576 (H)  03/28/2015 [id number] (H)  01/31/2015 35,728 (H)   CD4 T Cell Abs (/uL)  Date Value  10/24/2016 10 (L)  03/28/2015 20 (L)  01/31/2015 20 (L)   Microbiology: Recent Results (from the past 240 hour(s))  Wet prep, genital     Status: Abnormal   Collection Time: 10/16/16 11:36 PM  Result Value Ref Range Status   Yeast Wet Prep HPF POC NONE SEEN NONE SEEN Final   Trich, Wet Prep NONE SEEN NONE SEEN Final   Clue Cells Wet Prep HPF POC PRESENT (A) NONE SEEN Final   WBC, Wet Prep HPF POC MANY (A) NONE SEEN Final   Sperm NONE SEEN  Final    Michel Bickers, MD [facility] [phone] pager   [phone] cell 10/25/2016, 12:03 PM

## 2016-10-25 NOTE — Brief Op Note (Signed)
Severe distal esophageal stricture likely due to her immunocompromised state. Candida esophagitis and oropharyngeal candidiasis. S/P esophageal dilation to 13.5 mm. See endopro note for details. Keep NPO. Start IV Diflucan. Aspiration precautions. [facility] to f/u tomorrow.

## 2016-10-25 NOTE — Anesthesia Postprocedure Evaluation (Signed)
Anesthesia Post Note  Patient: [name]  Procedure(s) Performed: Procedure(s) (LRB): ESOPHAGOGASTRODUODENOSCOPY (EGD) (N/A) BALLOON DILATION (N/A)  Patient location during evaluation: Endoscopy Anesthesia Type: MAC Level of consciousness: awake, awake and alert and oriented Pain management: pain level controlled Vital Signs Assessment: post-procedure vital signs reviewed and stable Respiratory status: spontaneous breathing, nonlabored ventilation and respiratory function stable Cardiovascular status: blood pressure returned to baseline Anesthetic complications: no    Last Vitals:  Vitals:   10/25/16 1251 10/25/16 1754  BP: 125/78 135/80  Pulse: 79 80  Resp: 16   Temp: 37.7 C 37.1 C    Last Pain:  Vitals:   10/25/16 1521  TempSrc:   PainSc: 0-No pain                 Tamar Miano COKER

## 2016-10-25 NOTE — Op Note (Signed)
[facility] Patient Name: [name] Procedure Date : 10/25/2016 MRN: [id number] Attending MD: Lear Ng , MD Date of Birth: April 11, 1976 CSN: [id number] Age: 40 Admit Type: Inpatient Procedure:                Upper GI endoscopy Indications:              Dysphagia, Nausea with vomiting Providers:                Lear Ng, MD, Malka So, RN,                            Elspeth Cho Tech., Technician, Lance Coon,                            CRNA Referring MD:              Medicines:                Propofol per Anesthesia, Monitored Anesthesia Care Complications:            No immediate complications. Estimated Blood Loss:     Estimated blood loss was minimal. Procedure:                Pre-Anesthesia Assessment:                           - Prior to the procedure, a History and Physical                            was performed, and patient medications and                            allergies were reviewed. The patient's tolerance of                            previous anesthesia was also reviewed. The risks                            and benefits of the procedure and the sedation                            options and risks were discussed with the patient.                            All questions were answered, and informed consent                            was obtained. Prior Anticoagulants: The patient has                            taken no previous anticoagulant or antiplatelet                            agents. ASA Grade Assessment: III - A patient with  severe systemic disease. After reviewing the risks                            and benefits, the patient was deemed in                            satisfactory condition to undergo the procedure.                           After obtaining informed consent, the endoscope was                            passed under direct vision. Throughout the        procedure, the patient's blood pressure, pulse, and                            oxygen saturations were monitored continuously. The                            [id number]) scope was introduced through the                            mouth, and advanced to the second part of duodenum.                            The upper GI endoscopy was performed with                            difficulty due to stricture. Successful completion                            of the procedure was aided by performing the                            maneuvers documented (below) in this report. The                            patient tolerated the procedure well. Scope In: Scope Out: Findings:      Ritta Slot was found in the oropharynx.      Diffuse candidiasis was found in the entire esophagus.      One severe (stenosis; an endoscope cannot pass) benign-appearing,       intrinsic stenosis was found 32 cm from the incisors. And was traversed       after dilation. A TTS dilator was passed through the scope. Dilation       with an 07-24-09 mm balloon, a 09-26-11 mm balloon and a 12-13.5-15 mm       balloon dilator was performed to 13.5 mm under fluoroscopic guidance.       The dilation site was examined and showed moderate improvement in       luminal narrowing. Estimated blood loss was minimal.      A medium-sized hiatal hernia was present.      The entire examined stomach was normal.      The examined duodenum was normal.  Food was found in the entire esophagus. Impression:               - Ritta Slot was found in the oropharynx.                           - Monilial esophagitis.                           - Benign-appearing esophageal stenosis. Dilated.                            Question CMV vs. Herpes related stricture.                           - Medium-sized hiatal hernia.                           - Normal stomach.                           - Normal examined duodenum.                           - No  specimens collected. Moderate Sedation:      N/A - MAC procedure Recommendation:           - NPO.                           - Diflucan (fluconazole) 200 mg IV daily.                           - Aspiration precautions.                           - Post procedure medication orders were given. Procedure Code(s):        --- Professional ---                           [phone], Esophagogastroduodenoscopy, flexible,                            transoral; with transendoscopic balloon dilation of                            esophagus (less than 30 mm diameter) Diagnosis Code(s):        --- Professional ---                           R13.10, Dysphagia, unspecified                           K22.2, Esophageal obstruction                           R11.2, Nausea with vomiting, unspecified                           K44.9, Diaphragmatic hernia without obstruction  or                            gangrene                           B37.81, Candidal esophagitis                           B37.0, Candidal stomatitis CPT copyright 2016 [employer]. All rights reserved. The codes documented in this report are preliminary and upon coder review may  be revised to meet current compliance requirements. Lear Ng, MD 10/25/2016 12:10:26 PM This report has been signed electronically. Number of Addenda: 0

## 2016-10-25 NOTE — Interval H&P Note (Signed)
History and Physical Interval Note:  10/25/2016 10:55 AM  [name]  has presented today for surgery, with the diagnosis of dysphagia, nausea and vomiting  The various methods of treatment have been discussed with the patient and family. After consideration of risks, benefits and other options for treatment, the patient has consented to  Procedure(s): ESOPHAGOGASTRODUODENOSCOPY (EGD) (N/A) BALLOON DILATION (N/A) as a surgical intervention .  The patient's history has been reviewed, patient examined, no change in status, stable for surgery.  I have reviewed the patient's chart and labs.  Questions were answered to the patient's satisfaction.     [place] C.

## 2016-10-25 NOTE — Progress Notes (Signed)
Patient ID: [name], female   DOB: 22-Feb-1976, 40 y.o.   MRN: [id number]  PROGRESS NOTE    [name]  [id number] DOB: 1976/06/26 DOA: 10/23/2016  PCP: Philis Fendt, MD   Brief Narrative:  40 y.o. female with HIV reportedly on antiretrovirals (questionabl compliance with Genvoya), history of tight inflammatory esophageal stricture that was last dilated in 08/2015 and history of Candida esophagitis. She has been having progressive nausea and vomiting that worsened over past 4 days prior to this admission to the point she was not able to tolerate any liquids or solids without vomiting a few minutes after swallowing them. No reports of sensation of food stuck in the chest, no pain or difficulty swallowing. Pt had CT abdomen 10/16/2016 which did not show acute abnormalities. GI has seen her in consultation.    Assessment & Plan:   Principal Problem:   Nausea & vomiting in patient with tight esophageal stricture - Plan for EGD today  - Continue PPI therapy  - Continue IV fluids with hydration - Continue antiemetics as needed   Active Problems:   Human immunodeficiency virus (HIV) disease (HCC) - CD4 count 10 on this admission - Order placed for Bactrim and azithromycin for PCP and Mac prophylaxis respectively - ART resumed     Microcytic anemia / Anemia of chronic disease - Hemoglobin 7.9 this morning - We'll follow-up CBC tomorrow morning and if further drop we will give 1 unit of PRBC transfusion    Leukopenia - No neutropenia - Likely from HIV    DVT prophylaxis: Lovenox suBQ Code Status: full code  Family Communication: no family at the bedside this am Disposition Plan: Plan for EGD today   Consultants:   GI, Dr. Michail Sermon   Procedures:   EGD 11/10  Antimicrobials:   None     Subjective: No overnight events.   Objective: Vitals:   10/24/16 0817 10/24/16 2110 10/25/16 0529 10/25/16 0841  BP: 113/72 113/75 118/61 132/76  Pulse: 76 85 85 (!)  105  Resp: 17 18 17 18   Temp: 98 F (36.7 C) 98 F (36.7 C) (!) 100.7 F (38.2 C) 99.2 F (37.3 C)  TempSrc: Oral Oral Oral Oral  SpO2: 100% 100% 100% 100%  Weight:  78.1 kg (172 lb 3.2 oz)    Height:        Intake/Output Summary (Last 24 hours) at 10/25/16 1017 Last data filed at 10/25/16 1006  Gross per 24 hour  Intake          4151.67 ml  Output             1975 ml  Net          2176.67 ml   Filed Weights   10/23/16 1443 10/23/16 2252 10/24/16 2110  Weight: 77.1 kg (170 lb) 76.9 kg (169 lb 8 oz) 78.1 kg (172 lb 3.2 oz)    Examination:  General exam: Appears calm and comfortable, No distress Respiratory system: No wheezing, no rhonchi Cardiovascular system: S1 & S2 heard, rate controlled Gastrointestinal system: Appreciate bowel sounds, nontender abdomen Central nervous system: No focal neurological deficits. Extremities: No edema, palpable pulses  Skin: No rashes, lesions or ulcers Psychiatry: Normal mood and affect   Data Reviewed: I have personally reviewed following labs and imaging studies  CBC:  Recent Labs Lab 10/23/16 1453 10/24/16 0519 10/25/16 0541  WBC 5.5 3.5* 3.8*  HGB 10.0* 8.2* 7.9*  HCT 31.1* 25.9* 24.6*  MCV 69.7* 70.6* 70.3*  PLT 236 186 [id number]   Basic Metabolic Panel:  Recent Labs Lab 10/23/16 1453 10/24/16 0519 10/25/16 0541  NA 143 145 141  K 3.6 3.9 3.8  CL 111 114* 114*  CO2 19* 23 21*  GLUCOSE 102* 97 100*  BUN 15 12 5*  CREATININE 0.80 0.65 0.67  CALCIUM 9.8 8.8* 8.2*   GFR: Estimated Creatinine Clearance: 109 mL/min (by C-G formula based on SCr of 0.67 mg/dL). Liver Function Tests:  Recent Labs Lab 10/23/16 1453 10/24/16 0519  AST 56* 48*  ALT 43 33  ALKPHOS 93 69  BILITOT 0.7 0.9  PROT 7.7 6.8  ALBUMIN 4.0 3.2*    Recent Labs Lab 10/23/16 1453  LIPASE 26   No results for input(s): AMMONIA in the last 168 hours. Coagulation Profile: No results for input(s): INR, PROTIME in the last 168 hours. Cardiac  Enzymes: No results for input(s): CKTOTAL, CKMB, CKMBINDEX, TROPONINI in the last 168 hours. BNP (last 3 results) No results for input(s): PROBNP in the last 8760 hours. HbA1C: No results for input(s): HGBA1C in the last 72 hours. CBG: No results for input(s): GLUCAP in the last 168 hours. Lipid Profile: No results for input(s): CHOL, HDL, LDLCALC, TRIG, CHOLHDL, LDLDIRECT in the last 72 hours. Thyroid Function Tests: No results for input(s): TSH, T4TOTAL, FREET4, T3FREE, THYROIDAB in the last 72 hours. Anemia Panel:  Recent Labs  10/23/16 2311  FERRITIN 302  TIBC 288  IRON 18*   Urine analysis:    Component Value Date/Time   COLORURINE AMBER (A) 10/23/2016 1741   APPEARANCEUR CLOUDY (A) 10/23/2016 1741   LABSPEC 1.021 10/23/2016 1741   PHURINE 6.0 10/23/2016 1741   GLUCOSEU NEGATIVE 10/23/2016 1741   HGBUR SMALL (A) 10/23/2016 1741   BILIRUBINUR MODERATE (A) 10/23/2016 1741   KETONESUR >80 (A) 10/23/2016 1741   PROTEINUR 100 (A) 10/23/2016 1741   UROBILINOGEN 0.2 12/07/2014 1441   NITRITE NEGATIVE 10/23/2016 1741   LEUKOCYTESUR NEGATIVE 10/23/2016 1741   Sepsis Labs: @LABRCNTIP (procalcitonin:4,lacticidven:4)   Recent Results (from the past 240 hour(s))  Wet prep, genital     Status: Abnormal   Collection Time: 10/16/16 11:36 PM  Result Value Ref Range Status   Yeast Wet Prep HPF POC NONE SEEN NONE SEEN Final   Trich, Wet Prep NONE SEEN NONE SEEN Final   Clue Cells Wet Prep HPF POC PRESENT (A) NONE SEEN Final   WBC, Wet Prep HPF POC MANY (A) NONE SEEN Final   Sperm NONE SEEN  Final      Radiology Studies: No results found.   Scheduled Meds: . [MAR Hold] elvitegravir-cobicistat-emtricitabine-tenofovir  1 tablet Oral Q breakfast  . [MAR Hold] enoxaparin (LOVENOX) injection  40 mg Subcutaneous Q24H  . [MAR Hold] feeding supplement  1 Container Oral BID BM  . [MAR Hold] pantoprazole  40 mg Oral BID   Continuous Infusions: . sodium chloride    . 0.9 % NaCl  with KCl 20 mEq / L 1,000 mL (10/25/16 0659)     LOS: 1 day    Time spent: 15 minutes  Greater than 50% of the time spent on counseling and coordinating the care.   Leisa Lenz, MD [facility] Pager [phone]  If 7PM-7AM, please contact night-coverage [email or web address] Password [email or web address] 10/25/2016, 10:17 AM

## 2016-10-25 NOTE — Anesthesia Preprocedure Evaluation (Signed)
Anesthesia Evaluation  Patient identified by MRN, date of birth, ID band Patient awake    Reviewed: Allergy & Precautions, NPO status , Patient's Chart, lab work & pertinent test results  Airway Mallampati: II  TM Distance: >3 FB Neck ROM: Full    Dental  (+) Teeth Intact, Dental Advisory Given   Pulmonary    breath sounds clear to auscultation       Cardiovascular  Rhythm:Regular Rate:Normal     Neuro/Psych    GI/Hepatic   Endo/Other    Renal/GU      Musculoskeletal   Abdominal   Peds  Hematology   Anesthesia Other Findings   Reproductive/Obstetrics                             Anesthesia Physical Anesthesia Plan  ASA: III  Anesthesia Plan: MAC   Post-op Pain Management:    Induction: Intravenous  Airway Management Planned: Nasal Cannula and Natural Airway  Additional Equipment:   Intra-op Plan:   Post-operative Plan:   Informed Consent: I have reviewed the patients History and Physical, chart, labs and discussed the procedure including the risks, benefits and alternatives for the proposed anesthesia with the patient or authorized representative who has indicated his/her understanding and acceptance.   Dental advisory given  Plan Discussed with: CRNA and Anesthesiologist  Anesthesia Plan Comments:         Anesthesia Quick Evaluation

## 2016-10-25 NOTE — H&P (View-Only) (Signed)
Referring Provider: Dr. Charlies Silvers Primary Care Physician:  Philis Fendt, MD Primary Gastroenterologist:  Dr. Michail Sermon  Reason for Consultation:  Nausea, vomiting, dysphagia  HPI: [name] is a 40 y.o. female with HIV reportedly on antiretrovirals (in the office she reported compliance with her Jorje Guild but chart report on admit says she has not been taking that med for several months) although noncompliant with ID f/u who has a history of a tight inflammatory esophageal stricture that was last dilated in 08/2015 and history of Candida esophagitis. She has been having progressive nausea and vomiting that worsened this past Sunday when she was not able to tolerate any liquids or solids without vomiting a few minutes after swallowing them. Denies any sensation of food or liquid hanging up when she is swallowing but would start to vomit within 2-3 minutes of ingestion. Denies pain with swallowing. Having upper quadrant sharp abdominal pain this week as well. Moving solid nonbloody stools. Denies melena or hematochezia. Abd/pelvis CT scan last week unrevealing. CD4 count last year was 20, which was the last time she saw ID.  Past Medical History:  Diagnosis Date  . Acute psychosis 03/10/12   2nd admission in last wk for this  . Angio-edema   . Anxiety   . Asthma    inhaler 2xday  . Bipolar disorder ([place])   . Depression   . GERD (gastroesophageal reflux disease)   . Hepatitis B    /E-chart  . History of blood transfusion   . HIV positive ([place])   . Microcytic anemia    h/o per E-chart  . Noncompliance with medication regimen    /e-chart  . 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 Pyelonephritis    h/o per E-chart  . Shortness of breath dyspnea    due to Asthma  . Thyroid disease   . Urticaria     Past Surgical History:  Procedure Laterality Date  . BALLOON DILATION N/A 02/14/2015   Procedure: BALLOON DILATION;  Surgeon: Lear Ng, MD;  Location: [facility] ENDOSCOPY;  Service: Endoscopy;  Laterality: N/A;  . ESOPHAGOGASTRODUODENOSCOPY N/A 07/13/2013   Procedure: ESOPHAGOGASTRODUODENOSCOPY (EGD);  Surgeon: Beryle Beams, MD;  Location: [facility] ENDOSCOPY;  Service: Endoscopy;  Laterality: N/A;  . ESOPHAGOGASTRODUODENOSCOPY N/A 08/12/2014   Procedure: ESOPHAGOGASTRODUODENOSCOPY (EGD);  Surgeon: Milus Banister, MD;  Location: [facility] ENDOSCOPY;  Service: Endoscopy;  Laterality: N/A;  . ESOPHAGOGASTRODUODENOSCOPY N/A 02/14/2015   Procedure: ESOPHAGOGASTRODUODENOSCOPY (EGD);  Surgeon: Lear Ng, MD;  Location: [facility] ENDOSCOPY;  Service: Endoscopy;  Laterality: N/A;  . ESOPHAGOGASTRODUODENOSCOPY (EGD) WITH PROPOFOL N/A 04/11/2015   Procedure: ESOPHAGOGASTRODUODENOSCOPY (EGD) WITH PROPOFOL;  Surgeon: Wilford Corner, MD;  Location: [facility] ENDOSCOPY;  Service: Endoscopy;  Laterality: N/A;  . ESOPHAGOGASTRODUODENOSCOPY (EGD) WITH PROPOFOL N/A 09/11/2015   Procedure: ESOPHAGOGASTRODUODENOSCOPY (EGD) WITH PROPOFOL;  Surgeon: Wilford Corner, MD;  Location: [facility] ENDOSCOPY;  Service: Endoscopy;  Laterality: N/A;  . SAVORY DILATION N/A 02/14/2015   Procedure: SAVORY DILATION;  Surgeon: Lear Ng, MD;  Location: [place];  Service: Endoscopy;  Laterality: N/A;  no xray needed  . SAVORY DILATION N/A 04/11/2015   Procedure: SAVORY DILATION;  Surgeon: Wilford Corner, MD;  Location: [facility] ENDOSCOPY;  Service: Endoscopy;  Laterality: N/A;  . SAVORY DILATION N/A 09/11/2015   Procedure: SAVORY DILATION;  Surgeon: Wilford Corner, MD;  Location: [facility] ENDOSCOPY;  Service: Endoscopy;  Laterality: N/A;  . THYROID LOBECTOMY  08/2002   left & isthmectomy; for benign thyroid adenoma/E-chart  .  THYROID SURGERY    . TUBAL LIGATION  07/2002   /E-chart    Prior to Admission medications   Medication Sig Start Date End Date Taking? Authorizing Provider  pantoprazole (PROTONIX) 40 MG tablet Take 1 tablet (40 mg total) by mouth 2 (two) times daily. 12/12/14   Yes Theodis Blaze, MD  albuterol (PROAIR HFA) 108 (90 BASE) MCG/ACT inhaler Inhale 2 puffs into the lungs every 4 (four) hours as needed for wheezing or shortness of breath (cough). 11/16/15   Roselyn Malachy Moan, MD  beclomethasone (QVAR) 80 MCG/ACT inhaler Inhale 2 puffs into the lungs daily. Patient not taking: Reported on 10/23/2016 06/26/16   Gean Quint, MD  benztropine (COGENTIN) 1 MG tablet Take 1 tablet (1 mg total) by mouth daily. Patient not taking: Reported on 10/23/2016 08/20/14   Theodis Blaze, MD  cetirizine (ZYRTEC) 10 MG tablet Take 1 tablet (10 mg total) by mouth daily. Patient not taking: Reported on 10/23/2016 06/26/16   Gean Quint, MD  elvitegravir-cobicistat-emtricitabine-tenofovir (GENVOYA) 150-150-200-10 MG TABS tablet Take 1 tablet by mouth daily with breakfast. Patient not taking: Reported on 10/23/2016 01/13/15   Michel Bickers, MD  EPINEPHrine 0.3 mg/0.3 mL IJ SOAJ injection Inject 0.3 mLs (0.3 mg total) into the muscle once. 12/20/15   Roselyn Malachy Moan, MD  metoCLOPramide (REGLAN) 10 MG tablet Take 1 tablet (10 mg total) by mouth every 6 (six) hours as needed for nausea. Patient not taking: Reported on 10/23/2016 10/17/16   Roxanna Mew, PA-C  metroNIDAZOLE (FLAGYL) 500 MG tablet Take 1 tablet (500 mg total) by mouth 2 (two) times daily. Patient not taking: Reported on 10/23/2016 10/17/16 10/24/16  Roxanna Mew, PA-C  OLANZapine (ZYPREXA) 5 MG tablet TAKE 1 TABLET BY MOUTH AT BEDTIME Patient not taking: Reported on 10/23/2016 03/22/15   Michel Bickers, MD  oxyCODONE (OXY IR/ROXICODONE) 5 MG immediate release tablet Take 1 tablet (5 mg total) by mouth every 6 (six) hours as needed for severe pain. Patient not taking: Reported on 10/23/2016 10/17/16   Roxanna Mew, PA-C    Scheduled Meds: . elvitegravir-cobicistat-emtricitabine-tenofovir  1 tablet Oral Q breakfast  . enoxaparin (LOVENOX) injection  40 mg Subcutaneous Q24H  . pantoprazole  40 mg Oral BID    Continuous Infusions: . 0.9 % NaCl with KCl 20 mEq / L 125 mL/hr at 10/24/16 0045   PRN Meds:.acetaminophen **OR** acetaminophen, HYDROmorphone (DILAUDID) injection, ondansetron **OR** ondansetron (ZOFRAN) IV, oxyCODONE  Allergies as of 10/23/2016 - Review Complete 10/23/2016  Allergen Reaction Noted  . Other Hives and Itching 09/08/2015  . Bactrim Itching 11/05/2011  . Orange fruit Itching 02/20/2012  . Peanut-containing drug products Hives 11/05/2011  . Shellfish allergy Hives 11/05/2011  . Sulfa antibiotics Itching 11/01/2014  . Dapsone Itching and Rash 06/18/2013    Family History  Problem Relation Age of Onset  . Cancer Mother   . Diabetes Mother   . Diabetes Father   . Heart disease Father   . Diabetes Sister   . Urticaria Sister   . Asthma Son   . Allergic rhinitis Neg Hx   . Eczema Neg Hx   . Immunodeficiency Neg Hx     Social History   Social History  . Marital status: Single    Spouse name: N/A  . Number of children: N/A  . Years of education: N/A   Occupational History  . Not on file.   Social History Main Topics  . Smoking status: Never Smoker  .  Smokeless tobacco: Never Used  . Alcohol use 1.2 oz/week    2 Glasses of wine per week     Comment: occ  . Drug use: No  . Sexual activity: No     Comment: declined condoms   Other Topics Concern  . Not on file   Social History Narrative  . No narrative on file    Review of Systems: All negative except as stated above in HPI.  Physical Exam: Vital signs: Vitals:   10/24/16 0441 10/24/16 0817  BP: 111/72 113/72  Pulse: 70 76  Resp: 19 17  Temp: 97.7 F (36.5 C) 98 F (36.7 C)   Last BM Date: 10/23/16 General:  Lethargic, thin, mild acute distress HEENT: anicteric sclera Neck: supple, nontender Lungs:  Clear throughout to auscultation.   No wheezes, crackles, or rhonchi. No acute distress. Heart:  Regular rate and rhythm; no murmurs, clicks, rubs,  or gallops. Abdomen: upper quadrant  tenderness with guarding, soft, nondistended, +BS  Rectal:  Deferred Ext: no edema  GI:  Lab Results:  Recent Labs  10/23/16 1453 10/24/16 0519  WBC 5.5 3.5*  HGB 10.0* 8.2*  HCT 31.1* 25.9*  PLT 236 186   BMET  Recent Labs  10/23/16 1453 10/24/16 0519  NA 143 145  K 3.6 3.9  CL 111 114*  CO2 19* 23  GLUCOSE 102* 97  BUN 15 12  CREATININE 0.80 0.65  CALCIUM 9.8 8.8*   LFT  Recent Labs  10/24/16 0519  PROT 6.8  ALBUMIN 3.2*  AST 48*  ALT 33  ALKPHOS 69  BILITOT 0.9   PT/INR No results for input(s): LABPROT, INR in the last 72 hours.   Studies/Results: No results found.  Impression/Plan: Severe nausea/vomiting in the setting of HIV (noncompliance and low CD4 count last year) - symptoms concerning for infectious esophagitis vs recurrent esophageal stricture vs esophageal ulcer vs peptic ulcer. Clear liquid diet. NPO after midnight. EGD with possible dilation tomorrow morning to further evaluate. Likely will need ID f/u while inpatient for HIV management for defer to primary team (updated CD4 count pending). PPI PO BID ok. Supportive care.    LOS: 0 days   [place] C.  10/24/2016, 9:26 AM  Pager [phone]  If no answer or after 5 PM call [phone]

## 2016-10-25 NOTE — Transfer of Care (Signed)
Immediate Anesthesia Transfer of Care Note  Patient: [name]  Procedure(s) Performed: Procedure(s): ESOPHAGOGASTRODUODENOSCOPY (EGD) (N/A) BALLOON DILATION (N/A)  Patient Location: Endoscopy Unit  Anesthesia Type:MAC  Level of Consciousness: lethargic and responds to stimulation  Airway & Oxygen Therapy: Patient Spontanous Breathing and Patient connected to nasal cannula oxygen  Post-op Assessment: Report given to RN and Post -op Vital signs reviewed and stable  Post vital signs: Reviewed and stable  Last Vitals:  Vitals:   10/25/16 0841 10/25/16 1018  BP: 132/76 135/86  Pulse: (!) 105 (!) 102  Resp: 18 15  Temp: 37.3 C 37.5 C    Last Pain:  Vitals:   10/25/16 1018  TempSrc: Oral  PainSc:       Patients Stated Pain Goal: 0 ([id number])  Complications: No apparent anesthesia complications

## 2016-10-25 NOTE — Anesthesia Procedure Notes (Signed)
Procedure Name: MAC Date/Time: 10/25/2016 11:01 AM Performed by: Lance Coon Pre-anesthesia Checklist: Emergency Drugs available, Patient identified, Suction available, Patient being monitored and Timeout performed Patient Re-evaluated:Patient Re-evaluated prior to inductionOxygen Delivery Method: Nasal cannula Intubation Type: IV induction

## 2016-10-26 DIAGNOSIS — E43 Unspecified severe protein-calorie malnutrition: Secondary | ICD-10-CM | POA: Insufficient documentation

## 2016-10-26 DIAGNOSIS — B3781 Candidal esophagitis: Secondary | ICD-10-CM

## 2016-10-26 LAB — BASIC METABOLIC PANEL
Anion gap: 6 (ref 5–15)
CALCIUM: 8 mg/dL — AB (ref 8.9–10.3)
CO2: 21 mmol/L — ABNORMAL LOW (ref 22–32)
CREATININE: 0.63 mg/dL (ref 0.44–1.00)
Chloride: 113 mmol/L — ABNORMAL HIGH (ref 101–111)
GFR calc Af Amer: >60 mL/min (ref >60)
Glucose, Bld: 88 mg/dL (ref 65–99)
Potassium: 3.7 mmol/L (ref 3.5–5.1)
SODIUM: 140 mmol/L (ref 135–145)

## 2016-10-26 LAB — CBC
HCT: 22 % — ABNORMAL LOW (ref 36.0–46.0)
HEMOGLOBIN: 6.9 g/dL — AB (ref 12.0–15.0)
MCH: 22 pg — AB (ref 26.0–34.0)
MCHC: 31.4 g/dL (ref 30.0–36.0)
MCV: 70.1 fL — ABNORMAL LOW (ref 78.0–100.0)
Platelets: 149 10*3/uL — ABNORMAL LOW (ref 150–400)
RBC: 3.14 MIL/uL — AB (ref 3.87–5.11)
RDW: 16.9 % — ABNORMAL HIGH (ref 11.5–15.5)
WBC: 2.9 10*3/uL — AB (ref 4.0–10.5)

## 2016-10-26 LAB — PREPARE RBC (CROSSMATCH)

## 2016-10-26 MED ORDER — SODIUM CHLORIDE 0.9 % IV SOLN: Freq: Once | INTRAVENOUS | Status: DC

## 2016-10-26 NOTE — Progress Notes (Signed)
[facility] PROGRESS NOTE Subjective patient is taking clear liquids. She states she is doing well with this. Supposed to be NPO but is on a clear liquids.  Objective: Vital signs in last 24 hours: Temp:  [97.7 F (36.5 C)-99.8 F (37.7 C)] 98 F (36.7 C) (11/11 0900) Pulse Rate:  [70-102] 75 (11/11 0900) Resp:  [11-19] 18 (11/11 0900) BP: (103-143)/(63-89) 105/65 (11/11 0900) SpO2:  [98 %-100 %] 100 % (11/11 0900) Weight:  [78.1 kg (172 lb 3.2 oz)-81.5 kg (179 lb 10.8 oz)] 81.5 kg (179 lb 10.8 oz) (11/10 2141) Last BM Date: 10/25/16  Intake/Output from previous day: 11/10 0701 - 11/11 0700 In: 3700 [P.O.:100; I.V.:3600] Out: 2200 [Urine:2200] Intake/Output this shift: No intake/output data recorded.  PE: General-- somewhat sleepy   Lab Results:  Recent Labs  10/23/16 1453 10/24/16 0519 10/25/16 0541 10/26/16 0555  WBC 5.5 3.5* 3.8* 2.9*  HGB 10.0* 8.2* 7.9* 6.9*  HCT 31.1* 25.9* 24.6* 22.0*  PLT 236 186 165 149*   BMET  Recent Labs  10/23/16 1453 10/24/16 0519 10/25/16 0541 10/26/16 0555  NA 143 145 141 140  K 3.6 3.9 3.8 3.7  CL 111 114* 114* 113*  CO2 19* 23 21* 21*  CREATININE 0.80 0.65 0.67 0.63   LFT  Recent Labs  10/23/16 1453 10/24/16 0519  PROT 7.7 6.8  AST 56* 48*  ALT 43 33  ALKPHOS 93 69  BILITOT 0.7 0.9   PT/INR No results for input(s): LABPROT, INR in the last 72 hours. PANCREAS  Recent Labs  10/23/16 1453  LIPASE 26         Studies/Results: No results found.  Medications: I have reviewed the patient's current medications.  Assessment/Plan: 1. Esophageal stricture. This was dilated yesterday by Dr. Michail Sermon to 13.5 mm. She seems to be doing okay with liquids. There is no gross bleeding. She is on IV Diflucan for fungal esophagitis and ID has seen her and started her back on her HIV medications. Not really sure why her hemoglobin dropped could be related to fluids. She could be bleeding some from the  dilation but this is not grossly evident. Would keep her own clear liquids today and if she continues to do well would consider full liquids tomorrow.   Maliea Grandmaison JR,Kloi Brodman L 10/26/2016, 10:10 AM  This note was created using voice recognition software. Minor errors may Have occurred unintentionally.  Pager: [phone] If no answer or after hours call [phone]

## 2016-10-26 NOTE — Progress Notes (Addendum)
Patient ID: [name], female   DOB: 12-05-1976, 40 y.o.   MRN: [id number]  PROGRESS NOTE    [name]  [id number] DOB: Feb 05, 1976 DOA: 10/23/2016  PCP: Philis Fendt, MD   Brief Narrative:  40 y.o. female with HIV reportedly on antiretrovirals (questionabl compliance with Genvoya), history of tight inflammatory esophageal stricture that was last dilated in 08/2015 and history of Candida esophagitis. She has been having progressive nausea and vomiting that worsened over past 4 days prior to this admission to the point she was not able to tolerate any liquids or solids without vomiting a few minutes after swallowing them. No reports of sensation of food stuck in the chest, no pain or difficulty swallowing. Pt had CT abdomen 10/16/2016 which did not show acute abnormalities. GI has seen her in consultation.    Assessment & Plan:   Principal Problem:   Nausea & vomiting secondary to severe esophageal stricture / Candida esophagitis  - EGD showed severe distal esophageal stricture , candida esophagitis and oropharyngeal candidiasis  - GI started fluconazole 200 mg daily  - Continue PPI therapy  - Continue IV fluids with hydration - Continue antiemetics as needed   Active Problems:   Human immunodeficiency virus (HIV) disease (HCC) - CD4 count 10 on this admission - Apparently allergic to bactrim and azithro so those meds stopped - Continue Genvoya - Seen by ID    Microcytic anemia / Anemia of chronic disease - Transfuse 1 U PRBC today since hgb 6.9    Leukopenia - No neutropenia - Likely from HIV    DVT prophylaxis: Lovenox suBQ Code Status: full code  Family Communication: no family at the bedside this am Disposition Plan: home once cleared by GI   Consultants:   GI, Dr. Michail Sermon   ID, Dr. Megan Salon   Procedures:   EGD 11/10 - severe distal esophageal stricture , candida esophagitis and oropharyngeal candidiasis  Antimicrobials:   Fluconazole 11/1  -->  ART      Subjective: No overnight events.   Objective: Vitals:   10/25/16 1754 10/25/16 2141 10/26/16 0520 10/26/16 0900  BP: 135/80 111/63 103/68 105/65  Pulse: 80 78 70 75  Resp:  17 18 18   Temp: 98.7 F (37.1 C) 99.5 F (37.5 C) 97.7 F (36.5 C) 98 F (36.7 C)  TempSrc:  Oral Oral Oral  SpO2:  100% 100% 100%  Weight:  81.5 kg (179 lb 10.8 oz)    Height:        Intake/Output Summary (Last 24 hours) at 10/26/16 1445 Last data filed at 10/26/16 [id number]  Gross per 24 hour  Intake             3000 ml  Output             1425 ml  Net             1575 ml   Filed Weights   10/24/16 2110 10/25/16 1018 10/25/16 2141  Weight: 78.1 kg (172 lb 3.2 oz) 78.1 kg (172 lb 3.2 oz) 81.5 kg (179 lb 10.8 oz)    Examination:  General exam: No distress Respiratory system: No wheezing, no rhonchi, bilateral air entry  Cardiovascular system: S1 & S2 heard, rate controlled Gastrointestinal system: Appreciate bowel sounds, nontender abdomen Central nervous system: Nonfocal  Extremities: No edema, palpable pulses bilaterally  Skin: warm, dry  Psychiatry: Normal mood and affect, no agitation, no restlessness   Data Reviewed: I have personally reviewed following labs and  imaging studies  CBC:  Recent Labs Lab 10/23/16 1453 10/24/16 0519 10/25/16 0541 10/26/16 0555  WBC 5.5 3.5* 3.8* 2.9*  HGB 10.0* 8.2* 7.9* 6.9*  HCT 31.1* 25.9* 24.6* 22.0*  MCV 69.7* 70.6* 70.3* 70.1*  PLT 236 186 165 123456*   Basic Metabolic Panel:  Recent Labs Lab 10/23/16 1453 10/24/16 0519 10/25/16 0541 10/26/16 0555  NA 143 145 141 140  K 3.6 3.9 3.8 3.7  CL 111 114* 114* 113*  CO2 19* 23 21* 21*  GLUCOSE 102* 97 100* 88  BUN 15 12 5* <5*  CREATININE 0.80 0.65 0.67 0.63  CALCIUM 9.8 8.8* 8.2* 8.0*   GFR: Estimated Creatinine Clearance: 109 mL/min (by C-G formula based on SCr of 0.63 mg/dL). Liver Function Tests:  Recent Labs Lab 10/23/16 1453 10/24/16 0519  AST 56* 48*  ALT 43 33    ALKPHOS 93 69  BILITOT 0.7 0.9  PROT 7.7 6.8  ALBUMIN 4.0 3.2*    Recent Labs Lab 10/23/16 1453  LIPASE 26   No results for input(s): AMMONIA in the last 168 hours. Coagulation Profile: No results for input(s): INR, PROTIME in the last 168 hours. Cardiac Enzymes: No results for input(s): CKTOTAL, CKMB, CKMBINDEX, TROPONINI in the last 168 hours. BNP (last 3 results) No results for input(s): PROBNP in the last 8760 hours. HbA1C: No results for input(s): HGBA1C in the last 72 hours. CBG: No results for input(s): GLUCAP in the last 168 hours. Lipid Profile: No results for input(s): CHOL, HDL, LDLCALC, TRIG, CHOLHDL, LDLDIRECT in the last 72 hours. Thyroid Function Tests: No results for input(s): TSH, T4TOTAL, FREET4, T3FREE, THYROIDAB in the last 72 hours. Anemia Panel:  Recent Labs  10/23/16 2311  FERRITIN 302  TIBC 288  IRON 18*   Urine analysis:    Component Value Date/Time   COLORURINE AMBER (A) 10/23/2016 1741   APPEARANCEUR CLOUDY (A) 10/23/2016 1741   LABSPEC 1.021 10/23/2016 1741   PHURINE 6.0 10/23/2016 1741   GLUCOSEU NEGATIVE 10/23/2016 1741   HGBUR SMALL (A) 10/23/2016 1741   BILIRUBINUR MODERATE (A) 10/23/2016 1741   KETONESUR >80 (A) 10/23/2016 1741   PROTEINUR 100 (A) 10/23/2016 1741   UROBILINOGEN 0.2 12/07/2014 1441   NITRITE NEGATIVE 10/23/2016 1741   LEUKOCYTESUR NEGATIVE 10/23/2016 1741   Sepsis Labs: @LABRCNTIP (procalcitonin:4,lacticidven:4)   Recent Results (from the past 240 hour(s))  Wet prep, genital     Status: Abnormal   Collection Time: 10/16/16 11:36 PM  Result Value Ref Range Status   Yeast Wet Prep HPF POC NONE SEEN NONE SEEN Final   Trich, Wet Prep NONE SEEN NONE SEEN Final   Clue Cells Wet Prep HPF POC PRESENT (A) NONE SEEN Final   WBC, Wet Prep HPF POC MANY (A) NONE SEEN Final   Sperm NONE SEEN  Final      Radiology Studies: No results found.   Scheduled Meds: . sodium chloride   Intravenous Once  . benztropine   1 mg Oral Daily  . elvitegravir-cobicistat-emtricitabine-tenofovir  1 tablet Oral Q breakfast  . enoxaparin (LOVENOX) injection  40 mg Subcutaneous Q24H  . feeding supplement  1 Container Oral BID BM  . fluconazole (DIFLUCAN) IV  200 mg Intravenous Q24H  . OLANZapine  5 mg Oral QHS  . pantoprazole  40 mg Oral BID   Continuous Infusions: . 0.9 % NaCl with KCl 20 mEq / L 125 mL/hr at 10/26/16 0918     LOS: 2 days    Time spent: 15 minutes  Greater than 50% of the time spent on counseling and coordinating the care.   Leisa Lenz, MD [facility] Pager [phone]  If 7PM-7AM, please contact night-coverage [email or web address] Password [email or web address] 10/26/2016, 2:45 PM

## 2016-10-26 NOTE — Progress Notes (Addendum)
CRITICAL VALUE ALERT  Critical value received: HGB 6.9  Date of notification:  10/26/16  Time of notification:  0645  Critical value read back yes   Nurse who received alert: Pamala Hurry   MD notified (1st page): M. Donnal Debar NP   Time of first page: [phone]   MD notified (2nd page):  Time of second page:  Responding MD:  New Orders noted   Time MD responded:

## 2016-10-26 NOTE — Progress Notes (Signed)
[facility] for [facility]ious Disease  Date of Admission:  10/23/2016     Principal Problem:   Esophageal stricture Active Problems:   Nausea and vomiting   Candidal esophagitis (HCC)   Human immunodeficiency virus (HIV) disease (HCC)   Bipolar disorder (HCC)   GERD (gastroesophageal reflux disease)   Genital herpes   Microcytic anemia   Follicular adenoma of thyroid gland   Metabolic acidosis   Status post thyroidectomy   Asthma   Allergic rhinosinusitis   Protein-calorie malnutrition, severe   . sodium chloride   Intravenous Once  . benztropine  1 mg Oral Daily  . elvitegravir-cobicistat-emtricitabine-tenofovir  1 tablet Oral Q breakfast  . enoxaparin (LOVENOX) injection  40 mg Subcutaneous Q24H  . feeding supplement  1 Container Oral BID BM  . fluconazole (DIFLUCAN) IV  200 mg Intravenous Q24H  . OLANZapine  5 mg Oral QHS  . pantoprazole  40 mg Oral BID    SUBJECTIVE: History of having some pain when swallowing but this has improved. She is not having any difficulty swallowing her pills. She tells me she is not sure why she has had some much difficulty staying in care over the past decade but suspects it may be related to her mood swings. She states that she is fine with staying on her [name] now. She is currently not working and does not feel like there will be any problem getting back to see me in clinic within the next 1-2 weeks. She has Medicaid and no problem with her $3 co-pays.  Review of Systems: Review of Systems  Constitutional: Positive for malaise/fatigue. Negative for chills, diaphoresis, fever and weight loss.  HENT: Negative for sore throat.        Odynaphagia and dysphagia better.  Respiratory: Negative for cough, sputum production and shortness of breath.   Cardiovascular: Negative for chest pain.  Gastrointestinal: Negative for abdominal pain, diarrhea, heartburn, nausea and vomiting.  Genitourinary: Negative for dysuria and frequency.    Musculoskeletal: Negative for joint pain and myalgias.  Skin: Negative for rash.  Neurological: Negative for dizziness and headaches.  Psychiatric/Behavioral: Positive for depression. Negative for substance abuse. The patient is nervous/anxious.     Past Medical History:  Diagnosis Date  . Acute psychosis 03/10/12   2nd admission in last wk for this  . Angio-edema   . Anxiety   . Asthma    inhaler 2xday  . Bipolar disorder ([place])   . Depression   . GERD (gastroesophageal reflux disease)   . Hepatitis B    /E-chart  . History of blood transfusion   . HIV positive ([place])   . Microcytic anemia    h/o per E-chart  . Noncompliance with medication regimen    /e-chart  . 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 Pyelonephritis    h/o per E-chart  . Shortness of breath dyspnea    due to Asthma  . Thyroid disease   . Urticaria     Social History  Substance Use Topics  . Smoking status: Never Smoker  . Smokeless tobacco: Never Used  . Alcohol use 1.2 oz/week    2 Glasses of wine per week     Comment: occ    Family History  Problem Relation Age of Onset  . Cancer Mother   . Diabetes Mother   . Diabetes Father   . Heart disease Father   . Diabetes Sister   .  Urticaria Sister   . Asthma Son   . Allergic rhinitis Neg Hx   . Eczema Neg Hx   . Immunodeficiency Neg Hx    Allergies  Allergen Reactions  . Other Hives and Itching    Berries, TREE NUTS  . Bactrim Itching  . Orange Fruit Itching  . Peanut-Containing Drug Products Hives  . Shellfish Allergy Hives  . Sulfa Antibiotics Itching  . Dapsone Itching and Rash    OBJECTIVE: Vitals:   10/25/16 1754 10/25/16 2141 10/26/16 0520 10/26/16 0900  BP: 135/80 111/63 103/68 105/65  Pulse: 80 78 70 75  Resp:  17 18 18   Temp: 98.7 F (37.1 C) 99.5 F (37.5 C) 97.7 F (36.5 C) 98 F (36.7 C)  TempSrc:  Oral Oral Oral  SpO2:  100% 100% 100%  Weight:  179 lb 10.8 oz  (81.5 kg)    Height:       Body mass index is 24.37 kg/m.  Physical Exam  Constitutional: She is oriented to person, place, and time.  She is resting quietly and comfortably in bed.  HENT:  Mouth/Throat: No oropharyngeal exudate.  Cardiovascular: Normal rate and regular rhythm.   No murmur heard. Pulmonary/Chest: Effort normal and breath sounds normal.  Abdominal: Soft. There is no tenderness.  Neurological: She is alert and oriented to person, place, and time.  Skin: No rash noted.  Psychiatric: Mood and affect normal.    Lab Results Lab Results  Component Value Date   WBC 2.9 (L) 10/26/2016   HGB 6.9 (LL) 10/26/2016   HCT 22.0 (L) 10/26/2016   MCV 70.1 (L) 10/26/2016   PLT 149 (L) 10/26/2016    Lab Results  Component Value Date   CREATININE 0.63 10/26/2016   BUN <5 (L) 10/26/2016   NA 140 10/26/2016   K 3.7 10/26/2016   CL 113 (H) 10/26/2016   CO2 21 (L) 10/26/2016    Lab Results  Component Value Date   ALT 33 10/24/2016   AST 48 (H) 10/24/2016   ALKPHOS 69 10/24/2016   BILITOT 0.9 10/24/2016   HIV 1 RNA Quant (copies/mL)  Date Value  05/04/2015 515,576 (H)  03/28/2015 [id number] (H)  01/31/2015 35,728 (H)   CD4 T Cell Abs (/uL)  Date Value  10/24/2016 10 (L)  03/28/2015 20 (L)  01/31/2015 20 (L)     Microbiology: Recent Results (from the past 240 hour(s))  Wet prep, genital     Status: Abnormal   Collection Time: 10/16/16 11:36 PM  Result Value Ref Range Status   Yeast Wet Prep HPF POC NONE SEEN NONE SEEN Final   Trich, Wet Prep NONE SEEN NONE SEEN Final   Clue Cells Wet Prep HPF POC PRESENT (A) NONE SEEN Final   WBC, Wet Prep HPF POC MANY (A) NONE SEEN Final   Sperm NONE SEEN  Final     ASSESSMENT: She is doing better after repeat esophageal dilatation and on therapy for her candidal esophagitis. She is allergic to sulfa antibiotics and dapsone. She is intolerant of atovaquone. She has difficulty when she is asked to take a large numbers of  pills. I feel it is best to focus on as simple a medical regimen as possible to help her with adherence. I would continue Genvoya, PPI, Zyprexa, Cogentin and fluconazole. I have stopped her trimethoprim sulfamethoxazole and azithromycin. I will arrange follow-up in my clinic within the next 10 days.  PLAN: 1. I will sign off now but will arrange follow-up in my  clinic soon  Michel Bickers, MD [facility] [place] [phone] pager   [phone] cell 10/26/2016, 1:42 PM

## 2016-10-27 LAB — TYPE AND SCREEN
ABO/RH(D): A POS
Antibody Screen: POSITIVE
DAT, IgG: NEGATIVE
Unit division: 0

## 2016-10-27 LAB — CBC
HCT: 26.5 % — ABNORMAL LOW (ref 36.0–46.0)
Hemoglobin: 8.7 g/dL — ABNORMAL LOW (ref 12.0–15.0)
MCH: 23.5 pg — AB (ref 26.0–34.0)
MCHC: 32.8 g/dL (ref 30.0–36.0)
MCV: 71.4 fL — ABNORMAL LOW (ref 78.0–100.0)
PLATELETS: 159 10*3/uL (ref 150–400)
RBC: 3.71 MIL/uL — ABNORMAL LOW (ref 3.87–5.11)
RDW: 17.9 % — ABNORMAL HIGH (ref 11.5–15.5)
WBC: 3.2 10*3/uL — ABNORMAL LOW (ref 4.0–10.5)

## 2016-10-27 NOTE — Progress Notes (Signed)
[facility] PROGRESS NOTE Subjective patient reports that she is swallowing much better. Her hemoglobin was somewhat low and she did receive a transfusion.  Objective: Vital signs in last 24 hours: Temp:  [97.7 F (36.5 C)-98.5 F (36.9 C)] 98.2 F (36.8 C) (11/12 1000) Pulse Rate:  [66-79] 75 (11/12 1000) Resp:  [16-20] 18 (11/12 1000) BP: (103-122)/(60-77) 115/70 (11/12 1000) SpO2:  [100 %] 100 % (11/12 1000) Last BM Date: 10/25/16  Intake/Output from previous day: 11/11 0701 - 11/12 0700 In: 2730 [P.O.:720; I.V.:1250; Blood:660; IV Piggyback:100] Out: 1450 [Urine:1450] Intake/Output this shift: Total I/O In: 300 [P.O.:300] Out: 550 [Urine:550]    Lab Results:  Recent Labs  10/25/16 0541 10/26/16 0555 10/27/16 0534  WBC 3.8* 2.9* 3.2*  HGB 7.9* 6.9* 8.7*  HCT 24.6* 22.0* 26.5*  PLT 165 149* 159   BMET  Recent Labs  10/25/16 0541 10/26/16 0555  NA 141 140  K 3.8 3.7  CL 114* 113*  CO2 21* 21*  CREATININE 0.67 0.63   LFT No results for input(s): PROT, AST, ALT, ALKPHOS, BILITOT, BILIDIR, IBILI in the last 72 hours. PT/INR No results for input(s): LABPROT, INR in the last 72 hours. PANCREAS No results for input(s): LIPASE in the last 72 hours.       Studies/Results: No results found.  Medications: I have reviewed the patient's current medications.  Assessment/Plan: 1. Esophageal stricture. She is swallowing much better and I think could be discharged on PPI therapy and Diflucan. Would have her see Dr. Michail Sermon back in the office in about 2 weeks to arrange repeat dilatation. Please call back if there are more problems.   Ozzie Knobel JR,Maysel Mccolm L 10/27/2016, 11:33 AM  This note was created using voice recognition software. Minor errors may Have occurred unintentionally.  Pager: [phone] If no answer or after hours call [phone]

## 2016-10-27 NOTE — Progress Notes (Signed)
Patient ID: [name], female   DOB: 05/18/1976, 40 y.o.   MRN: [id number]  PROGRESS NOTE    KAIDEE [name]  [id number] DOB: 1976-01-03 DOA: 10/23/2016  PCP: Philis Fendt, MD   Brief Narrative:  40 y.o. female with HIV reportedly on antiretrovirals (questionabl compliance with Genvoya), history of tight inflammatory esophageal stricture that was last dilated in 08/2015 and history of Candida esophagitis. She has been having progressive nausea and vomiting that worsened over past 4 days prior to this admission to the point she was not able to tolerate any liquids or solids without vomiting a few minutes after swallowing them. No reports of sensation of food stuck in the chest, no pain or difficulty swallowing. Pt had CT abdomen 10/16/2016 which did not show acute abnormalities. GI has seen her in consultation.    Assessment & Plan:   Principal Problem:   Nausea & vomiting secondary to severe esophageal stricture / Candida esophagitis  - EGD showed severe distal esophageal stricture , candida esophagitis and oropharyngeal candidiasis  - GI started fluconazole 200 mg daily  - Continue PPI therapy  - Feels little better this am, swallowing better  Active Problems:   Human immunodeficiency virus (HIV) disease (HCC) - CD4 count 10 on this admission - Apparently allergic to bactrim and azithro so those meds stopped - Continue Genvoya - Seen by ID    Microcytic anemia / Anemia of chronic disease - Transfused 1 U PRBC 11/11 - Hgb stable post transfusion     Leukopenia - No neutropenia - Likely from HIV    DVT prophylaxis: Lovenox suBQ Code Status: full code  Family Communication: no family at the bedside this am Disposition Plan: home in am    Consultants:   GI, Dr. Michail Sermon   ID, Dr. Megan Salon   Procedures:   EGD 11/10 - severe distal esophageal stricture , candida esophagitis and oropharyngeal candidiasis  Antimicrobials:   Fluconazole 11/1 -->  ART       Subjective: No overnight events.   Objective: Vitals:   10/26/16 1717 10/26/16 2002 10/27/16 0549 10/27/16 1000  BP: 103/67 122/77 120/75 115/70  Pulse: 74 66 67 75  Resp: 20 16 19 18   Temp: 98.2 F (36.8 C) 98.5 F (36.9 C) 97.7 F (36.5 C) 98.2 F (36.8 C)  TempSrc: Oral Oral Oral Oral  SpO2: 100%  100% 100%  Weight:      Height:        Intake/Output Summary (Last 24 hours) at 10/27/16 1301 Last data filed at 10/27/16 1000  Gross per 24 hour  Intake             3030 ml  Output             2000 ml  Net             1030 ml   Filed Weights   10/24/16 2110 10/25/16 1018 10/25/16 2141  Weight: 78.1 kg (172 lb 3.2 oz) 78.1 kg (172 lb 3.2 oz) 81.5 kg (179 lb 10.8 oz)    Examination:  General exam: No distress, calm and comfortable  Respiratory system: bilateral air entry, no wheezing  Cardiovascular system: S1 & S2 heard, rate controlled Gastrointestinal system: Appreciate bowel sounds, nontender abdomen Central nervous system: No focal deficits  Extremities: No edema, palpable pulses bilaterally  Skin: warm, dry, no ulcer  Psychiatry: Normal mood and affect  Data Reviewed: I have personally reviewed following labs and imaging studies  CBC:  Recent  Labs Lab 10/23/16 1453 10/24/16 0519 10/25/16 0541 10/26/16 0555 10/27/16 0534  WBC 5.5 3.5* 3.8* 2.9* 3.2*  HGB 10.0* 8.2* 7.9* 6.9* 8.7*  HCT 31.1* 25.9* 24.6* 22.0* 26.5*  MCV 69.7* 70.6* 70.3* 70.1* 71.4*  PLT 236 186 165 149* [id number]   Basic Metabolic Panel:  Recent Labs Lab 10/23/16 1453 10/24/16 0519 10/25/16 0541 10/26/16 0555  NA 143 145 141 140  K 3.6 3.9 3.8 3.7  CL 111 114* 114* 113*  CO2 19* 23 21* 21*  GLUCOSE 102* 97 100* 88  BUN 15 12 5* <5*  CREATININE 0.80 0.65 0.67 0.63  CALCIUM 9.8 8.8* 8.2* 8.0*   GFR: Estimated Creatinine Clearance: 109 mL/min (by C-G formula based on SCr of 0.63 mg/dL). Liver Function Tests:  Recent Labs Lab 10/23/16 1453 10/24/16 0519  AST 56* 48*   ALT 43 33  ALKPHOS 93 69  BILITOT 0.7 0.9  PROT 7.7 6.8  ALBUMIN 4.0 3.2*    Recent Labs Lab 10/23/16 1453  LIPASE 26   No results for input(s): AMMONIA in the last 168 hours. Coagulation Profile: No results for input(s): INR, PROTIME in the last 168 hours. Cardiac Enzymes: No results for input(s): CKTOTAL, CKMB, CKMBINDEX, TROPONINI in the last 168 hours. BNP (last 3 results) No results for input(s): PROBNP in the last 8760 hours. HbA1C: No results for input(s): HGBA1C in the last 72 hours. CBG: No results for input(s): GLUCAP in the last 168 hours. Lipid Profile: No results for input(s): CHOL, HDL, LDLCALC, TRIG, CHOLHDL, LDLDIRECT in the last 72 hours. Thyroid Function Tests: No results for input(s): TSH, T4TOTAL, FREET4, T3FREE, THYROIDAB in the last 72 hours. Anemia Panel: No results for input(s): VITAMINB12, FOLATE, FERRITIN, TIBC, IRON, RETICCTPCT in the last 72 hours. Urine analysis:    Component Value Date/Time   COLORURINE AMBER (A) 10/23/2016 1741   APPEARANCEUR CLOUDY (A) 10/23/2016 1741   LABSPEC 1.021 10/23/2016 1741   PHURINE 6.0 10/23/2016 1741   GLUCOSEU NEGATIVE 10/23/2016 1741   HGBUR SMALL (A) 10/23/2016 1741   BILIRUBINUR MODERATE (A) 10/23/2016 1741   KETONESUR >80 (A) 10/23/2016 1741   PROTEINUR 100 (A) 10/23/2016 1741   UROBILINOGEN 0.2 12/07/2014 1441   NITRITE NEGATIVE 10/23/2016 1741   LEUKOCYTESUR NEGATIVE 10/23/2016 1741   Sepsis Labs: @LABRCNTIP (procalcitonin:4,lacticidven:4)   No results found for this or any previous visit (from the past 240 hour(s)).    Radiology Studies: No results found.   Scheduled Meds: . sodium chloride   Intravenous Once  . benztropine  1 mg Oral Daily  . elvitegravir-cobicistat-emtricitabine-tenofovir  1 tablet Oral Q breakfast  . enoxaparin (LOVENOX) injection  40 mg Subcutaneous Q24H  . feeding supplement  1 Container Oral BID BM  . fluconazole (DIFLUCAN) IV  200 mg Intravenous Q24H  .  OLANZapine  5 mg Oral QHS  . pantoprazole  40 mg Oral BID   Continuous Infusions: . 0.9 % NaCl with KCl 20 mEq / L 125 mL/hr at 10/27/16 0801     LOS: 3 days    Time spent: 15 minutes  Greater than 50% of the time spent on counseling and coordinating the care.   Leisa Lenz, MD [facility] Pager [phone]  If 7PM-7AM, please contact night-coverage [email or web address] Password [email or web address] 10/27/2016, 1:01 PM

## 2016-10-28 ENCOUNTER — Encounter (HOSPITAL_COMMUNITY): Payer: Self-pay | Admitting: Gastroenterology

## 2016-10-28 LAB — CBC
HEMATOCRIT: 26.4 % — AB (ref 36.0–46.0)
HEMOGLOBIN: 8.6 g/dL — AB (ref 12.0–15.0)
MCH: 23 pg — AB (ref 26.0–34.0)
MCHC: 32.6 g/dL (ref 30.0–36.0)
MCV: 70.6 fL — ABNORMAL LOW (ref 78.0–100.0)
Platelets: 188 10*3/uL (ref 150–400)
RBC: 3.74 MIL/uL — ABNORMAL LOW (ref 3.87–5.11)
RDW: 18.1 % — ABNORMAL HIGH (ref 11.5–15.5)
WBC: 3 10*3/uL — ABNORMAL LOW (ref 4.0–10.5)

## 2016-10-28 MED ORDER — PANTOPRAZOLE SODIUM 40 MG PO TBEC: 40.0000 mg | DELAYED_RELEASE_TABLET | Freq: Every day | ORAL | 0 refills | Status: DC

## 2016-10-28 MED ORDER — BOOST / RESOURCE BREEZE PO LIQD: 1.0000 | Freq: Two times a day (BID) | ORAL | 0 refills | Status: DC

## 2016-10-28 MED ORDER — HYDROCODONE-ACETAMINOPHEN 5-325 MG PO TABS: 1.0000 | ORAL_TABLET | Freq: Two times a day (BID) | ORAL | 0 refills | Status: DC | PRN

## 2016-10-28 MED ORDER — OXYCODONE HCL 5 MG PO TABS
5.0000 mg | ORAL_TABLET | Freq: Once | ORAL | Status: AC
Start: 2016-10-28 — End: 2016-10-28
  Administered 2016-10-28: 5 mg via ORAL

## 2016-10-28 MED ORDER — ACETAMINOPHEN 325 MG PO TABS: 650.0000 mg | ORAL_TABLET | Freq: Four times a day (QID) | ORAL | 0 refills | Status: DC | PRN

## 2016-10-28 MED ORDER — FLUCONAZOLE 100 MG PO TABS: 200.0000 mg | ORAL_TABLET | Freq: Every day | ORAL | 0 refills | Status: DC

## 2016-10-28 NOTE — Discharge Summary (Signed)
Physician Discharge Summary  [name] [id number] DOB: 1976/08/18 DOA: 10/23/2016  PCP: Philis Fendt, MD  Admit date: 10/23/2016 Discharge date: 10/28/2016  Recommendations for Outpatient Follow-up:  Continue fluconazole for 14 days on discharge. Follow-up with infectious disease and GI per scheduled appointment.  Discharge Diagnoses:  Principal Problem:   Esophageal stricture Active Problems:   Human immunodeficiency virus (HIV) disease (HCC)   Genital herpes   Microcytic anemia   Bipolar disorder (HCC)   Follicular adenoma of thyroid gland   Metabolic acidosis   Nausea and vomiting   Status post thyroidectomy   Asthma   Allergic rhinosinusitis   GERD (gastroesophageal reflux disease)   Candidal esophagitis (HCC)   Protein-calorie malnutrition, severe    Discharge Condition: stable   Diet recommendation: as tolerated   History of present illness:  40 y.o.femalewith HIV reportedly on antiretrovirals (questionabl compliance with Genvoya), history of tight inflammatory esophageal stricture that was last dilated in 08/2015 and history of Candida esophagitis. She has been having progressive nausea and vomiting that worsened over past 4 days prior to this admission to the point she was not able to tolerate any liquids or solids without vomiting a few minutes after swallowing them. No reports of sensation of food stuck in the chest, no pain or difficulty swallowing. Pt had CT abdomen 10/16/2016 which did not show acute abnormalities. GI has seen her in consultation. EGD done and showed candida esophagitis.   Hospital Course:  Assessment & Plan:   Principal Problem:   Nausea & vomiting secondary to severe esophageal stricture / Candida esophagitis  - EGD showed severe distal esophageal stricture , candida esophagitis and oropharyngeal candidiasis  - GI started fluconazole 200 mg daily, continue for 14 days on discharge  - Continue PPI therapy    Active  Problems:   Human immunodeficiency virus (HIV) disease ([place]) - CD4 count 10 on this admission - Apparently allergic to bactrim and azithro so those meds stopped - Continue Genvoya - Seen by ID    Microcytic anemia / Anemia of chronic disease - Transfused 1 U PRBC 11/11 - Hgb stable post transfusion     Leukopenia - No neutropenia - Likely from HIV    DVT prophylaxis: Lovenox suBQ Code Status: full code  Family Communication: no family at the bedside this am Disposition Plan: home in am   Consultants:   GI, Dr. Michail Sermon   ID, Dr. Megan Salon   Procedures:   EGD 11/10 - severe distal esophageal stricture , candida esophagitis and oropharyngeal candidiasis  Antimicrobials:   Fluconazole 11/1 --> for 14 days on discharge   ART      Signed:  Leisa Lenz, MD  [facility] 10/28/2016, 10:15 AM  Pager #: [phone]  Time spent in minutes: less than 30 minutes   Discharge Exam: Vitals:   10/27/16 2056 10/28/16 0527  BP: 129/88 130/80  Pulse: 96 65  Resp: 18 18  Temp: 98.5 F (36.9 C) 98.4 F (36.9 C)   Vitals:   10/27/16 1000 10/27/16 1730 10/27/16 2056 10/28/16 0527  BP: 115/70 114/76 129/88 130/80  Pulse: 75 85 96 65  Resp: 18 18 18 18   Temp: 98.2 F (36.8 C) 98.3 F (36.8 C) 98.5 F (36.9 C) 98.4 F (36.9 C)  TempSrc: Oral Oral Oral Oral  SpO2: 100% 98% 99% 99%  Weight:   81.7 kg (180 lb 1.9 oz)   Height:        General: Pt is alert, follows commands appropriately, not  in acute distress Cardiovascular: Regular rate and rhythm, S1/S2 +, no murmurs Respiratory: Clear to auscultation bilaterally, no wheezing, no crackles, no rhonchi Abdominal: Soft, non tender, non distended, bowel sounds +, no guarding Extremities: no edema, no cyanosis, pulses palpable bilaterally DP and PT Neuro: Grossly nonfocal  Discharge Instructions  Discharge Instructions    Call MD for:  persistant nausea and vomiting    Complete by:  As directed     Call MD for:  redness, tenderness, or signs of infection (pain, swelling, redness, odor or green/yellow discharge around incision site)    Complete by:  As directed    Call MD for:  severe uncontrolled pain    Complete by:  As directed    Diet - low sodium heart healthy    Complete by:  As directed    Discharge instructions    Complete by:  As directed    Continue fluconazole for 14 days on discharge. Follow-up with infectious disease and GI per scheduled appointment.   Increase activity slowly    Complete by:  As directed        Medication List    STOP taking these medications   beclomethasone 80 MCG/ACT inhaler Commonly known as:  QVAR   cetirizine 10 MG tablet Commonly known as:  ZYRTEC   metoCLOPramide 10 MG tablet Commonly known as:  REGLAN   metroNIDAZOLE 500 MG tablet Commonly known as:  FLAGYL   oxyCODONE 5 MG immediate release tablet Commonly known as:  Oxy IR/ROXICODONE     TAKE these medications   acetaminophen 325 MG tablet Commonly known as:  TYLENOL Take 2 tablets (650 mg total) by mouth every 6 (six) hours as needed for mild pain (or Fever >/= 101).   albuterol 108 (90 Base) MCG/ACT inhaler Commonly known as:  PROAIR HFA Inhale 2 puffs into the lungs every 4 (four) hours as needed for wheezing or shortness of breath (cough).   benztropine 1 MG tablet Commonly known as:  COGENTIN Take 1 tablet (1 mg total) by mouth daily.   elvitegravir-cobicistat-emtricitabine-tenofovir 150-150-200-10 MG Tabs tablet Commonly known as:  GENVOYA Take 1 tablet by mouth daily with breakfast.   EPINEPHrine 0.3 mg/0.3 mL Soaj injection Commonly known as:  EPI-PEN Inject 0.3 mLs (0.3 mg total) into the muscle once.   feeding supplement Liqd Take 1 Container by mouth 2 (two) times daily between meals.   fluconazole 100 MG tablet Commonly known as:  DIFLUCAN Take 2 tablets (200 mg total) by mouth daily.   HYDROcodone-acetaminophen 5-325 MG tablet Commonly known as:   NORCO Take 1 tablet by mouth 2 (two) times daily as needed for moderate pain.   OLANZapine 5 MG tablet Commonly known as:  ZYPREXA TAKE 1 TABLET BY MOUTH AT BEDTIME   pantoprazole 40 MG tablet Commonly known as:  PROTONIX Take 1 tablet (40 mg total) by mouth 2 (two) times daily. What changed:  Another medication with the same name was added. Make sure you understand how and when to take each.   pantoprazole 40 MG tablet Commonly known as:  PROTONIX Take 1 tablet (40 mg total) by mouth daily. What changed:  You were already taking a medication with the same name, and this prescription was added. Make sure you understand how and when to take each.       Follow-up Information    Philis Fendt, MD. Schedule an appointment as soon as possible for a visit in 1 week(s).   Specialty:  Internal Medicine  Contact information: [place] [address] [phone]        Michel Bickers, MD .   Specialty:  Infectious Diseases Contact information: [address] [place]  [id number] [phone]            The results of significant diagnostics from this hospitalization (including imaging, microbiology, ancillary and laboratory) are listed below for reference.    Significant Diagnostic Studies: Ct Abdomen Pelvis W Contrast  Result Date: 10/16/2016 CLINICAL DATA:  Right upper quadrant pain and elevated liver function tests EXAM: CT ABDOMEN AND PELVIS WITH CONTRAST TECHNIQUE: Multidetector CT imaging of the abdomen and pelvis was performed using the standard protocol following bolus administration of intravenous contrast. CONTRAST:  131mL ISOVUE-300 IOPAMIDOL (ISOVUE-300) INJECTION 61% COMPARISON:  Abdominal ultrasound 10/16/2016 FINDINGS: Lower chest: No pulmonary nodules. No visible pleural or pericardial effusion. Hepatobiliary: Normal hepatic size and contours without focal liver lesion. No perihepatic ascites. No intra- or extrahepatic biliary  dilatation. Normal gallbladder. Pancreas: Normal pancreatic contours and enhancement. No peripancreatic fluid collection or pancreatic ductal dilatation. Spleen: Normal. Adrenals/Urinary Tract: Normal adrenal glands. No hydronephrosis or solid renal mass. Stomach/Bowel: No abnormal bowel dilatation. No bowel wall thickening or adjacent fat stranding to indicate acute inflammation. No abdominal fluid collection. Normal appendix. Vascular/Lymphatic: Normal course and caliber of the major abdominal vessels. No abdominal or pelvic adenopathy. Reproductive: Uterus and ovaries are normal for patient age. Small amounts of fluid in the pelvis may be physiologic. Musculoskeletal: No acute osseous injury. No bony spinal canal stenosis. Normal visualized extrathoracic and extraperitoneal soft tissues. Other: No contributory non-categorized findings. IMPRESSION: No acute abnormality of the abdomen or pelvis. Electronically Signed   By: Ulyses Jarred M.D.   On: 10/16/2016 22:54   US Abdomen Limited Ruq  Result Date: 10/16/2016 CLINICAL DATA:  Chronic right upper quadrant abdominal pain. Initial encounter. EXAM: US ABDOMEN LIMITED - RIGHT UPPER QUADRANT COMPARISON:  CT of the abdomen and pelvis performed 12/06/2014, and abdominal ultrasound performed 08/10/2014 FINDINGS: Gallbladder: No gallstones or wall thickening visualized. No sonographic Murphy sign noted by sonographer. Common bile duct: Diameter: 0.4 cm, within normal limits in caliber. Liver: No focal lesion identified. Within normal limits in parenchymal echogenicity, though echotexture is somewhat coarsened. IMPRESSION: No acute abnormality seen at the right upper quadrant. Electronically Signed   By: Garald Balding M.D.   On: 10/16/2016 21:29    Microbiology: No results found for this or any previous visit (from the past 240 hour(s)).   Labs: Basic Metabolic Panel:  Recent Labs Lab 10/23/16 1453 10/24/16 0519 10/25/16 0541 10/26/16 0555  NA 143 145  141 140  K 3.6 3.9 3.8 3.7  CL 111 114* 114* 113*  CO2 19* 23 21* 21*  GLUCOSE 102* 97 100* 88  BUN 15 12 5* <5*  CREATININE 0.80 0.65 0.67 0.63  CALCIUM 9.8 8.8* 8.2* 8.0*   Liver Function Tests:  Recent Labs Lab 10/23/16 1453 10/24/16 0519  AST 56* 48*  ALT 43 33  ALKPHOS 93 69  BILITOT 0.7 0.9  PROT 7.7 6.8  ALBUMIN 4.0 3.2*    Recent Labs Lab 10/23/16 1453  LIPASE 26   No results for input(s): AMMONIA in the last 168 hours. CBC:  Recent Labs Lab 10/24/16 0519 10/25/16 0541 10/26/16 0555 10/27/16 0534 10/28/16 0423  WBC 3.5* 3.8* 2.9* 3.2* 3.0*  HGB 8.2* 7.9* 6.9* 8.7* 8.6*  HCT 25.9* 24.6* 22.0* 26.5* 26.4*  MCV 70.6* 70.3* 70.1* 71.4* 70.6*  PLT 186  165 149* 159 188   Cardiac Enzymes: No results for input(s): CKTOTAL, CKMB, CKMBINDEX, TROPONINI in the last 168 hours. BNP: BNP (last 3 results) No results for input(s): BNP in the last 8760 hours.  ProBNP (last 3 results) No results for input(s): PROBNP in the last 8760 hours.  CBG: No results for input(s): GLUCAP in the last 168 hours.

## 2016-10-28 NOTE — Discharge Instructions (Signed)
Esophagitis °Esophagitis is inflammation of the esophagus. The esophagus is the tube that carries food and liquids from your mouth to your stomach. Esophagitis can cause soreness or pain in the esophagus. This condition can make it difficult and painful to swallow.  °CAUSES °Most causes of esophagitis are not serious. Common causes of this condition include: °· Gastroesophageal reflux disease (GERD). This is when stomach contents move back up into the esophagus (reflux). °· Repeated vomiting. °· An allergic-type reaction, especially caused by food allergies (eosinophilic esophagitis). °· Injury to the esophagus by swallowing large pills with or without water, or swallowing certain types of medicines. °· Swallowing (ingesting) harmful chemicals, such as household cleaning products. °· Heavy alcohol use. °· An infection of the esophagus. This most often occurs in people who have a weakened immune system. °· Radiation or chemotherapy treatment for cancer. °· Certain diseases such as sarcoidosis, Crohn disease, and scleroderma. °SYMPTOMS °Symptoms of this condition include: °· Difficult or painful swallowing. °· Pain with swallowing acidic liquids, such as citrus juices. °· Pain with burping. °· Chest pain. °· Difficulty breathing. °· Nausea. °· Vomiting. °· Pain in the abdomen. °· Weight loss. °· Ulcers in the mouth. °· Patches of white material in the mouth (candidiasis). °· Fever. °· Coughing up blood or vomiting blood. °· Stool that is black, tarry, or bright red. °DIAGNOSIS °Your health care provider will take a medical history and perform a physical exam. You may also have other tests, including: °· An endoscopy to examine your stomach and esophagus with a small camera. °· A test that measures the acidity level in your esophagus. °· A test that measures how much pressure is on your esophagus. °· A barium swallow or modified barium swallow to show the shape, size, and functioning of your esophagus. °· Allergy  tests. °TREATMENT °Treatment for this condition depends on the cause of your esophagitis. In some cases, steroids or other medicines may be given to help relieve your symptoms or to treat the underlying cause of your condition. You may have to make some lifestyle changes, such as: °· Avoiding alcohol. °· Quitting smoking. °· Changing your diet. °· Exercising. °· Changing your sleep habits and your sleep environment. °HOME CARE INSTRUCTIONS °Take these actions to decrease your discomfort and to help avoid complications. °Diet °· Follow a diet as recommended by your health care provider. This may involve avoiding foods and drinks such as: °¨ Coffee and tea (with or without caffeine). °¨ Drinks that contain alcohol. °¨ Energy drinks and sports drinks. °¨ Carbonated drinks or sodas. °¨ Chocolate and cocoa. °¨ Peppermint and mint flavorings. °¨ Garlic and onions. °¨ Horseradish. °¨ Spicy and acidic foods, including peppers, chili powder, curry powder, vinegar, hot sauces, and barbecue sauce. °¨ Citrus fruit juices and citrus fruits, such as oranges, lemons, and limes. °¨ Tomato-based foods, such as red sauce, chili, salsa, and pizza with red sauce. °¨ Fried and fatty foods, such as donuts, french fries, potato chips, and high-fat dressings. °¨ High-fat meats, such as hot dogs and fatty cuts of red and white meats, such as rib eye steak, sausage, ham, and bacon. °¨ High-fat dairy items, such as whole milk, butter, and cream cheese. °· Eat small, frequent meals instead of large meals. °· Avoid drinking large amounts of liquid with your meals. °· Avoid eating meals during the 2-3 hours before bedtime. °· Avoid lying down right after you eat. °· Do not exercise right after you eat. °· Avoid foods and drinks that seem to   make your symptoms worse. °General Instructions °· Pay attention to any changes in your symptoms. °· Take over-the-counter and prescription medicines only as told by your health care provider. Do not take  aspirin, ibuprofen, or other NSAIDs unless your health care provider told you to do so. °· If you have trouble taking pills, use a pill splitter to decrease the size of the pill. This will decrease the chance of the pill getting stuck or injuring your esophagus on the way down. Also, drink water after you take a pill. °· Do not use any tobacco products, including cigarettes, chewing tobacco, and e-cigarettes. If you need help quitting, ask your health care provider. °· Wear loose-fitting clothing. Do not wear anything tight around your waist that causes pressure on your abdomen. °· Raise (elevate) the head of your bed about 6 inches (15 cm). °· Try to reduce your stress, such as with yoga or meditation. If you need help reducing stress, ask your health care provider. °· If you are overweight, reduce your weight to an amount that is healthy for you. Ask your health care provider for guidance about a safe weight loss goal. °· Keep all follow-up visits as told by your health care provider. This is important. °SEEK MEDICAL CARE IF: °· You have new symptoms. °· You have unexplained weight loss. °· You have difficulty swallowing, or it hurts to swallow. °· You have wheezing or a persistent cough. °· Your symptoms do not improve with treatment. °· You have frequent heartburn for more than two weeks. °SEEK IMMEDIATE MEDICAL CARE IF: °· You have severe pain in your arms, neck, jaw, teeth, or back. °· You feel sweaty, dizzy, or light-headed. °· You have chest pain or shortness of breath. °· You vomit and your vomit looks like blood or coffee grounds. °· Your stool is bloody or black. °· You have a fever. °· You cannot swallow, drink, or eat. °  °This information is not intended to replace advice given to you by your health care provider. Make sure you discuss any questions you have with your health care provider. °  °Document Released: 01/09/2005 Document Revised: 08/23/2015 Document Reviewed: 03/29/2015 °[employer]vier Interactive  Patient Education ©2016 [employer] ° °

## 2016-11-06 ENCOUNTER — Ambulatory Visit: Payer: Medicaid Other | Admitting: Internal Medicine

## 2016-11-06 LAB — REFLEX TO GENOSURE(R) MG: HIV GENOSURE(R) MG PDF: 0

## 2016-11-06 LAB — HIV-1 RNA ULTRAQUANT REFLEX TO GENTYP+
HIV-1 RNA BY PCR: 2300000 {copies}/mL
HIV-1 RNA QUANT, LOG: 6.362 {Log_copies}/mL

## 2016-11-07 LAB — REFLEX TO GENOSURE(R) MG: HIV GENOSURE(R) MG PDF: 0

## 2016-11-07 LAB — GENOSURE INTEGRASE HIV EDI: HIV GENOSURE PDF1: 0

## 2016-11-07 LAB — HIV-1 RNA ULTRAQUANT REFLEX TO GENTYP+
HIV-1 RNA BY PCR: 942000 {copies}/mL
HIV-1 RNA Quant, Log: 5.974 log10copy/mL

## 2016-11-12 ENCOUNTER — Ambulatory Visit (INDEPENDENT_AMBULATORY_CARE_PROVIDER_SITE_OTHER): Payer: Medicaid Other | Admitting: Internal Medicine

## 2016-11-12 ENCOUNTER — Encounter: Payer: Self-pay | Admitting: Internal Medicine

## 2016-11-12 DIAGNOSIS — B20 Human immunodeficiency virus [HIV] disease: Secondary | ICD-10-CM

## 2016-11-12 DIAGNOSIS — F3131 Bipolar disorder, current episode depressed, mild: Secondary | ICD-10-CM

## 2016-11-12 DIAGNOSIS — F319 Bipolar disorder, unspecified: Secondary | ICD-10-CM

## 2016-11-12 DIAGNOSIS — K219 Gastro-esophageal reflux disease without esophagitis: Secondary | ICD-10-CM | POA: Diagnosis not present

## 2016-11-12 DIAGNOSIS — B3781 Candidal esophagitis: Secondary | ICD-10-CM | POA: Diagnosis not present

## 2016-11-12 LAB — CBC
HCT: 28.1 % — ABNORMAL LOW (ref 35.0–45.0)
Hemoglobin: 9 g/dL — ABNORMAL LOW (ref 11.7–15.5)
MCH: 23.3 pg — AB (ref 27.0–33.0)
MCHC: 32 g/dL (ref 32.0–36.0)
MCV: 72.8 fL — ABNORMAL LOW (ref 80.0–100.0)
MPV: 9 fL (ref 7.5–12.5)
PLATELETS: 281 10*3/uL (ref 140–400)
RBC: 3.86 MIL/uL (ref 3.80–5.10)
RDW: 21.1 % — ABNORMAL HIGH (ref 11.0–15.0)
WBC: 3.7 10*3/uL — AB (ref 3.8–10.8)

## 2016-11-12 MED ORDER — FLUCONAZOLE 100 MG PO TABS: 100.0000 mg | ORAL_TABLET | ORAL | 11 refills | Status: DC

## 2016-11-12 MED ORDER — BENZTROPINE MESYLATE 1 MG PO TABS: 1.0000 mg | ORAL_TABLET | Freq: Every day | ORAL | 11 refills | Status: AC

## 2016-11-12 MED ORDER — OLANZAPINE 5 MG PO TABS: 5.0000 mg | ORAL_TABLET | Freq: Every day | ORAL | 11 refills | Status: AC

## 2016-11-12 MED ORDER — PANTOPRAZOLE SODIUM 40 MG PO TBEC: 40.0000 mg | DELAYED_RELEASE_TABLET | Freq: Every day | ORAL | 11 refills | Status: AC

## 2016-11-12 MED ORDER — ELVITEG-COBIC-EMTRICIT-TENOFAF 150-150-200-10 MG PO TABS: 1.0000 | ORAL_TABLET | Freq: Every day | ORAL | 11 refills | Status: DC

## 2016-11-12 NOTE — Assessment & Plan Note (Signed)
She has suffered from depression for many years. I have refilled her Zyprexa and Cogentin. I asked her to set a goal of returning to [facility] for evaluation within the next week.

## 2016-11-12 NOTE — Assessment & Plan Note (Signed)
Her thrush and candidal esophagitis have resolved. I will change her to once weekly fluconazole.

## 2016-11-12 NOTE — Assessment & Plan Note (Signed)
Since falling out of care and stopping her medication her infection had roared out of control with a viral load earlier this month of 942,000 and his CD4 count of only 10. We talked again today that that is why she developed recurrent candidal esophagitis. She was at risk for all other opportunistic infections associated with AIDS. She has done much better with adherence since discharge. She recalls missing only one dose of Genvoya this past Saturday when she became distracted and forgot. I will have her repeat lab work today in follow-up in 2 weeks.

## 2016-11-12 NOTE — Progress Notes (Signed)
Patient Active Problem List   Diagnosis Date Noted  . Human immunodeficiency virus (HIV) disease ([place]) 02/28/2009    Priority: High  . GERD (gastroesophageal reflux disease) 10/25/2016    Priority: Medium  . Candidal esophagitis ([place]) 10/25/2016    Priority: Medium  . Esophageal stricture 12/06/2014    Priority: Medium  . Bipolar disorder ([place]) 04/01/2012    Priority: Medium  . Protein-calorie malnutrition, severe 10/26/2016  . Status post thyroidectomy 10/25/2016  . Asthma 10/25/2016  . 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 Genital herpes 02/28/2009  . Microcytic anemia 02/27/2009    Patient's Medications  New Prescriptions   No medications on file  Previous Medications   ALBUTEROL (PROAIR HFA) 108 (90 BASE) MCG/ACT INHALER    Inhale 2 puffs into the lungs every 4 (four) hours as needed for wheezing or shortness of breath (cough).   EPINEPHRINE 0.3 MG/0.3 ML IJ SOAJ INJECTION    Inject 0.3 mLs (0.3 mg total) into the muscle once.  Modified Medications   Modified Medication Previous Medication   BENZTROPINE (COGENTIN) 1 MG TABLET benztropine (COGENTIN) 1 MG tablet      Take 1 tablet (1 mg total) by mouth daily.    Take 1 tablet (1 mg total) by mouth daily.   ELVITEGRAVIR-COBICISTAT-EMTRICITABINE-TENOFOVIR (GENVOYA) 150-150-200-10 MG TABS TABLET elvitegravir-cobicistat-emtricitabine-tenofovir (GENVOYA) 150-150-200-10 MG TABS tablet      Take 1 tablet by mouth daily with breakfast.    Take 1 tablet by mouth daily with breakfast.   FLUCONAZOLE (DIFLUCAN) 100 MG TABLET fluconazole (DIFLUCAN) 100 MG tablet      Take 1 tablet (100 mg total) by mouth once a week.    Take 2 tablets (200 mg total) by mouth daily.   OLANZAPINE (ZYPREXA) 5 MG TABLET OLANZapine (ZYPREXA) 5 MG tablet      Take 1 tablet (5 mg total) by mouth at bedtime.    TAKE 1 TABLET BY MOUTH AT BEDTIME   PANTOPRAZOLE (PROTONIX) 40 MG TABLET pantoprazole  (PROTONIX) 40 MG tablet      Take 1 tablet (40 mg total) by mouth daily.    Take 1 tablet (40 mg total) by mouth 2 (two) times daily.  Discontinued Medications   ACETAMINOPHEN (TYLENOL) 325 MG TABLET    Take 2 tablets (650 mg total) by mouth every 6 (six) hours as needed for mild pain (or Fever >/= 101).   FEEDING SUPPLEMENT (BOOST / RESOURCE BREEZE) LIQD    Take 1 Container by mouth 2 (two) times daily between meals.   HYDROCODONE-ACETAMINOPHEN (NORCO) 5-325 MG TABLET    Take 1 tablet by mouth 2 (two) times daily as needed for moderate pain.   PANTOPRAZOLE (PROTONIX) 40 MG TABLET    Take 1 tablet (40 mg total) by mouth daily.    Subjective: [name] is in for her hospital follow-up visit. She was hospitalized again last month with recurrent esophageal peptic stricture and candidal esophagitis. She has many times before she had fallen out of care last year and was not taking her medications. She has been depressed. She has not been on her Zyprexa or Cogentin. While hospitalized she underwent esophageal dilatation and started back on all of her medications. She is feeling much better. She still has some difficulty swallowing bread and avoids it. She has a little bit of difficulty with chicken but can eat it. She mainly eats and vegetables and smoothies. She has no pain  with swallowing. She has not had difficulty swallowing her pills. She is still depressed but feeling somewhat better. She has not gone back to [place] yet because she knows she will have to go stand in line to get an appointment.  Review of Systems: Review of Systems  Constitutional: Positive for malaise/fatigue. Negative for chills, diaphoresis, fever and weight loss.  HENT: Negative for sore throat.        As noted in history of present illness.  Respiratory: Negative for cough, sputum production and shortness of breath.   Cardiovascular: Negative for chest pain.  Gastrointestinal: Negative for abdominal pain, diarrhea, heartburn,  nausea and vomiting.  Genitourinary: Negative for dysuria and frequency.  Musculoskeletal: Negative for joint pain and myalgias.  Skin: Negative for rash.  Neurological: Negative for dizziness and headaches.  Psychiatric/Behavioral: Positive for depression. Negative for substance abuse and suicidal ideas. The patient is not nervous/anxious.     Past Medical History:  Diagnosis Date  . Acute psychosis 03/10/12   2nd admission in last wk for this  . Angio-edema   . Anxiety   . Asthma    inhaler 2xday  . Bipolar disorder ([place])   . Depression   . GERD (gastroesophageal reflux disease)   . Hepatitis B    /E-chart  . History of blood transfusion   . HIV positive ([place])   . Microcytic anemia    h/o per E-chart  . Noncompliance with medication regimen    /e-chart  . 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 Pyelonephritis    h/o per E-chart  . Shortness of breath dyspnea    due to Asthma  . Thyroid disease   . Urticaria     Social History  Substance Use Topics  . Smoking status: Never Smoker  . Smokeless tobacco: Never Used  . Alcohol use No     Comment: occ    Family History  Problem Relation Age of Onset  . Cancer Mother   . Diabetes Mother   . Diabetes Father   . Heart disease Father   . Diabetes Sister   . Urticaria Sister   . Asthma Son   . Allergic rhinitis Neg Hx   . Eczema Neg Hx   . Immunodeficiency Neg Hx     Allergies  Allergen Reactions  . Other Hives and Itching    Berries, TREE NUTS  . Bactrim Itching  . Orange Fruit Itching  . Peanut-Containing Drug Products Hives  . Shellfish Allergy Hives  . Sulfa Antibiotics Itching  . Dapsone Itching and Rash    Objective:  Vitals:   11/12/16 1617  BP: (!) 149/92  Pulse: 69  Temp: 97.9 F (36.6 C)  TempSrc: Oral  Weight: 171 lb (77.6 kg)  Height: 6' (1.829 m)   Body mass index is 23.19 kg/m.  Physical Exam  Constitutional: She is oriented to  person, place, and time.  She is periodically tearful during the exam but she is also smiling and in better spirits. She certainly looks like she is feeling better than when I saw her in the hospital.  HENT:  Mouth/Throat: No oropharyngeal exudate.  Eyes: Conjunctivae are normal.  Cardiovascular: Normal rate and regular rhythm.   No murmur heard. Pulmonary/Chest: Effort normal and breath sounds normal. She has no wheezes. She has no rales.  Abdominal: Soft. She exhibits no mass. There is no tenderness.  Musculoskeletal: Normal range of motion.  Neurological: She is alert and oriented to person,  place, and time.  Skin: No rash noted.  Psychiatric: Mood and affect normal.    Lab Results Lab Results  Component Value Date   WBC 3.0 (L) 10/28/2016   HGB 8.6 (L) 10/28/2016   HCT 26.4 (L) 10/28/2016   MCV 70.6 (L) 10/28/2016   PLT 188 10/28/2016    Lab Results  Component Value Date   CREATININE 0.63 10/26/2016   BUN <5 (L) 10/26/2016   NA 140 10/26/2016   K 3.7 10/26/2016   CL 113 (H) 10/26/2016   CO2 21 (L) 10/26/2016    Lab Results  Component Value Date   ALT 33 10/24/2016   AST 48 (H) 10/24/2016   ALKPHOS 69 10/24/2016   BILITOT 0.9 10/24/2016    Lab Results  Component Value Date   CHOL 184 01/31/2015   HDL 68 01/31/2015   LDLCALC 85 01/31/2015   TRIG 156 (H) 01/31/2015   CHOLHDL 2.7 01/31/2015   HIV viral load 10/22/2016: 942,000 HIV 1 RNA Quant (copies/mL)  Date Value  05/04/2015 515,576 (H)  03/28/2015 [id number] (H)  01/31/2015 35,728 (H)   CD4 T Cell Abs (/uL)  Date Value  10/24/2016 10 (L)  03/28/2015 20 (L)  01/31/2015 20 (L)     Problem List Items Addressed This Visit      High   Human immunodeficiency virus (HIV) disease ([place])    Since falling out of care and stopping her medication her infection had roared out of control with a viral load earlier this month of 942,000 and his CD4 count of only 10. We talked again today that that is why she  developed recurrent candidal esophagitis. She was at risk for all other opportunistic infections associated with AIDS. She has done much better with adherence since discharge. She recalls missing only one dose of Genvoya this past Saturday when she became distracted and forgot. I will have her repeat lab work today in follow-up in 2 weeks.      Relevant Medications   fluconazole (DIFLUCAN) 100 MG tablet   elvitegravir-cobicistat-emtricitabine-tenofovir (GENVOYA) 150-150-200-10 MG TABS tablet   Other Relevant Orders   T-helper cell (CD4)- ([facility] clinic only)   HIV 1 RNA quant-no reflex-bld   CBC   Comprehensive metabolic panel     Medium   Bipolar disorder ([place])    She has suffered from depression for many years. I have refilled her Zyprexa and Cogentin. I asked her to set a goal of returning to [facility] for evaluation within the next week.      Candidal esophagitis ([facility])    Her thrush and candidal esophagitis have resolved. I will change her to once weekly fluconazole.      Relevant Medications   fluconazole (DIFLUCAN) 100 MG tablet   elvitegravir-cobicistat-emtricitabine-tenofovir (GENVOYA) 150-150-200-10 MG TABS tablet   GERD (gastroesophageal reflux disease)    Her acid reflux symptoms are currently controlled with once daily Protonix.      Relevant Medications   pantoprazole (PROTONIX) 40 MG tablet    Other Visit Diagnoses    Bipolar I disorder ([place])       Relevant Medications   OLANZapine (ZYPREXA) 5 MG tablet        Michel Bickers, MD [facility] for [facility] [place] [phone] pager   [phone] cell 11/12/2016, 5:00 PM

## 2016-11-12 NOTE — Assessment & Plan Note (Signed)
Her acid reflux symptoms are currently controlled with once daily Protonix.

## 2016-11-13 LAB — COMPREHENSIVE METABOLIC PANEL
ALT: 10 U/L (ref 6–29)
AST: 17 U/L (ref 10–30)
Albumin: 3.9 g/dL (ref 3.6–5.1)
Alkaline Phosphatase: 78 U/L (ref 33–115)
BUN: 3 mg/dL — AB (ref 7–25)
CHLORIDE: 105 mmol/L (ref 98–110)
CO2: 23 mmol/L (ref 20–31)
CREATININE: 0.66 mg/dL (ref 0.50–1.10)
Calcium: 9.1 mg/dL (ref 8.6–10.2)
Glucose, Bld: 96 mg/dL (ref 65–99)
Potassium: 4.1 mmol/L (ref 3.5–5.3)
SODIUM: 138 mmol/L (ref 135–146)
Total Bilirubin: 0.3 mg/dL (ref 0.2–1.2)
Total Protein: 6.9 g/dL (ref 6.1–8.1)

## 2016-11-14 LAB — T-HELPER CELL (CD4) - (RCID CLINIC ONLY)
CD4 T CELL HELPER: 2 % — AB (ref 33–55)
CD4 T Cell Abs: 20 /uL — ABNORMAL LOW (ref 400–2700)

## 2016-11-14 LAB — HIV-1 RNA QUANT-NO REFLEX-BLD
HIV 1 RNA QUANT: 5404 {copies}/mL — AB (ref <20)
HIV-1 RNA QUANT, LOG: 3.73 {Log_copies}/mL — AB (ref <1.30)

## 2016-11-28 ENCOUNTER — Telehealth: Payer: Self-pay | Admitting: *Deleted

## 2016-11-28 ENCOUNTER — Ambulatory Visit: Payer: Medicaid Other | Admitting: Internal Medicine

## 2016-11-28 NOTE — Telephone Encounter (Signed)
Message left for patient to call to make a new appt with Dr. Megan Salon.

## 2017-01-13 ENCOUNTER — Other Ambulatory Visit: Payer: Self-pay | Admitting: Gastroenterology

## 2017-01-13 ENCOUNTER — Encounter (HOSPITAL_COMMUNITY): Payer: Self-pay | Admitting: *Deleted

## 2017-01-13 NOTE — Progress Notes (Signed)
Pt denies SOB. Dr. Conrad [place], Anesthesia, made aware that pt C/O chest pain since yesterday with N/V today and pt advised to seek medical attention but stated that " it is probably my reflux." Dr. Conrad [place] stated that pt should be evaluated upon arrival. Pt denies being under the care of a cardiologist. Pt made aware to stop taking  Aspirin, vitamins, fish oil and herbal medications. Do not take any NSAIDs ie: Ibuprofen, Advil, Naproxen, BC and Goody Powder or any medication containing Aspirin. Pt stated that MD advised that she take her Protonix and Antivirals on morning of procedure. Pt verbalized understanding of all pre-op instructions.

## 2017-01-14 ENCOUNTER — Ambulatory Visit (HOSPITAL_COMMUNITY): Payer: Medicaid Other | Admitting: Certified Registered Nurse Anesthetist

## 2017-01-14 ENCOUNTER — Ambulatory Visit (HOSPITAL_COMMUNITY): Payer: Medicaid Other

## 2017-01-14 ENCOUNTER — Encounter (HOSPITAL_COMMUNITY)
Admission: RE | Disposition: A | Discharge status: Home / Self Care | Payer: Self-pay | Source: Ambulatory Visit | Attending: Gastroenterology

## 2017-01-14 ENCOUNTER — Encounter (HOSPITAL_COMMUNITY): Payer: Self-pay | Admitting: *Deleted

## 2017-01-14 ENCOUNTER — Ambulatory Visit (HOSPITAL_COMMUNITY)
Admission: RE | Admit: 2017-01-14 | Discharge: 2017-01-14 | Disposition: A | Discharge status: Home / Self Care | Payer: Medicaid Other | Source: Ambulatory Visit | Attending: Gastroenterology | Admitting: Gastroenterology

## 2017-01-14 DIAGNOSIS — B009 Herpesviral infection, unspecified: Secondary | ICD-10-CM | POA: Insufficient documentation

## 2017-01-14 DIAGNOSIS — J45909 Unspecified asthma, uncomplicated: Secondary | ICD-10-CM | POA: Diagnosis not present

## 2017-01-14 DIAGNOSIS — B20 Human immunodeficiency virus [HIV] disease: Secondary | ICD-10-CM | POA: Insufficient documentation

## 2017-01-14 DIAGNOSIS — K222 Esophageal obstruction: Secondary | ICD-10-CM | POA: Diagnosis not present

## 2017-01-14 DIAGNOSIS — K449 Diaphragmatic hernia without obstruction or gangrene: Secondary | ICD-10-CM | POA: Diagnosis not present

## 2017-01-14 DIAGNOSIS — K210 Gastro-esophageal reflux disease with esophagitis: Secondary | ICD-10-CM | POA: Diagnosis not present

## 2017-01-14 DIAGNOSIS — Z79899 Other long term (current) drug therapy: Secondary | ICD-10-CM | POA: Diagnosis not present

## 2017-01-14 DIAGNOSIS — B3781 Candidal esophagitis: Secondary | ICD-10-CM | POA: Insufficient documentation

## 2017-01-14 DIAGNOSIS — R1310 Dysphagia, unspecified: Secondary | ICD-10-CM | POA: Diagnosis present

## 2017-01-14 HISTORY — PX: BALLOON DILATION: SHX5330

## 2017-01-14 HISTORY — PX: ESOPHAGOGASTRODUODENOSCOPY (EGD) WITH PROPOFOL: SHX5813

## 2017-01-14 SURGERY — ESOPHAGOGASTRODUODENOSCOPY (EGD) WITH PROPOFOL
Anesthesia: Monitor Anesthesia Care

## 2017-01-14 MED ORDER — LACTATED RINGERS IV SOLN
INTRAVENOUS | Status: DC | PRN
  Administered 2017-01-14: 14:00:00 via INTRAVENOUS

## 2017-01-14 MED ORDER — PROPOFOL 10 MG/ML IV BOLUS
INTRAVENOUS | Status: DC | PRN
  Administered 2017-01-14 (×4): 20 mg via INTRAVENOUS

## 2017-01-14 MED ORDER — BUTAMBEN-TETRACAINE-BENZOCAINE 2-2-14 % EX AERO
INHALATION_SPRAY | CUTANEOUS | Status: DC | PRN
  Administered 2017-01-14: 2 via TOPICAL

## 2017-01-14 MED ORDER — PROPOFOL 500 MG/50ML IV EMUL
INTRAVENOUS | Status: DC | PRN
  Administered 2017-01-14: 100 ug/kg/min via INTRAVENOUS

## 2017-01-14 NOTE — Anesthesia Preprocedure Evaluation (Deleted)
Anesthesia Evaluation  Patient identified by MRN, date of birth, ID band Patient awake    Reviewed: Allergy & Precautions, NPO status , Patient's Chart, lab work & pertinent test results  Airway Mallampati: II  TM Distance: >3 FB Neck ROM: Full    Dental  (+) Teeth Intact, Dental Advisory Given   Pulmonary    breath sounds clear to auscultation       Cardiovascular  Rhythm:Regular Rate:Normal     Neuro/Psych    GI/Hepatic   Endo/Other    Renal/GU      Musculoskeletal   Abdominal   Peds  Hematology   Anesthesia Other Findings   Reproductive/Obstetrics                                                              Anesthesia Evaluation  Patient identified by MRN, date of birth, ID band Patient awake    Reviewed: Allergy & Precautions, NPO status , Patient's Chart, lab work & pertinent test results  Airway Mallampati: II   Neck ROM: full    Dental   Pulmonary asthma ,  breath sounds clear to auscultation        Cardiovascular negative cardio ROS  Rhythm:regular Rate:Normal     Neuro/Psych PSYCHIATRIC DISORDERS Depression Bipolar Disorder Schizophrenia    GI/Hepatic PUD, (+) Hepatitis -, B  Endo/Other    Renal/GU      Musculoskeletal   Abdominal   Peds  Hematology  (+) HIV,   Anesthesia Other Findings   Reproductive/Obstetrics                             Anesthesia Physical Anesthesia Plan  ASA: II  Anesthesia Plan: MAC   Post-op Pain Management:    Induction: Intravenous  Airway Management Planned: Nasal Cannula  Additional Equipment:   Intra-op Plan:   Post-operative Plan:   Informed Consent: I have reviewed the patients History and Physical, chart, labs and discussed the procedure including the risks, benefits and alternatives for the proposed anesthesia with the patient or authorized representative who has  indicated his/her understanding and acceptance.     Plan Discussed with: CRNA, Anesthesiologist and Surgeon  Anesthesia Plan Comments:         Anesthesia Quick Evaluation  Anesthesia Physical Anesthesia Plan  ASA: II  Anesthesia Plan: MAC   Post-op Pain Management:    Induction: Intravenous  Airway Management Planned: Simple Face Mask  Additional Equipment:   Intra-op Plan:   Post-operative Plan:   Informed Consent:   Dental advisory given  Plan Discussed with: Anesthesiologist and Surgeon  Anesthesia Plan Comments:         Anesthesia Quick Evaluation

## 2017-01-14 NOTE — Interval H&P Note (Signed)
History and Physical Interval Note:  01/14/2017 2:17 PM  [name]  has presented today for surgery, with the diagnosis of dysphagia/stricture  The various methods of treatment have been discussed with the patient and family. After consideration of risks, benefits and other options for treatment, the patient has consented to  Procedure(s): ESOPHAGOGASTRODUODENOSCOPY (EGD) WITH PROPOFOL (N/A) BALLOON DILATION (N/A) SAVORY DILATION (N/A) as a surgical intervention .  The patient's history has been reviewed, patient examined, no change in status, stable for surgery.  I have reviewed the patient's chart and labs.  Questions were answered to the patient's satisfaction.     [place] C.

## 2017-01-14 NOTE — H&P (Signed)
  Date of Initial H&P: 01/10/17  History reviewed, patient examined, no change in status, stable for surgery.

## 2017-01-14 NOTE — Op Note (Signed)
[facility] Patient Name: [name] Procedure Date : 01/14/2017 MRN: [id number] Attending MD: Lear Ng , MD Date of Birth: 01-May-1976 CSN: [id number] Age: 41 Admit Type: Outpatient Procedure:                Upper GI endoscopy Indications:              Dysphagia, Stricture of the esophagus Providers:                Lear Ng, MD, Carolynn Comment, RN, Cletis [place], Technician Referring MD:              Medicines:                Propofol per Anesthesia, Monitored Anesthesia Care Complications:            No immediate complications. Estimated Blood Loss:     Estimated blood loss was minimal. Procedure:                Pre-Anesthesia Assessment:                           - Prior to the procedure, a History and Physical                            was performed, and patient medications and                            allergies were reviewed. The patient's tolerance of                            previous anesthesia was also reviewed. The risks                            and benefits of the procedure and the sedation                            options and risks were discussed with the patient.                            All questions were answered, and informed consent                            was obtained. Prior Anticoagulants: The patient has                            taken no previous anticoagulant or antiplatelet                            agents. ASA Grade Assessment: III - A patient with                            severe systemic disease. After reviewing the risks  and benefits, the patient was deemed in                            satisfactory condition to undergo the procedure.                           After obtaining informed consent, the endoscope was                            passed under direct vision. Throughout the                            procedure, the patient's blood pressure, pulse,  and                            oxygen saturations were monitored continuously. The                            [id number] ([id number]) scope was introduced through the                            mouth, and advanced to the second part of duodenum.                            The upper GI endoscopy was performed with                            difficulty due to stricture. Successful completion                            of the procedure was aided by straightening and                            shortening the scope to obtain bowel loop                            reduction. The patient tolerated the procedure                            fairly well. Scope In: Scope Out: Findings:      One severe (stenosis; an endoscope cannot pass) benign-appearing,       intrinsic stenosis was found 32 cm from the incisors. This measured 1 cm       (inner diameter) and was traversed after dilation. A TTS dilator was       passed through the scope. Dilation with a 09-26-11 mm balloon dilator       was performed to 12 mm under fluoroscopic guidance. The dilation site       was examined and showed moderate improvement in luminal narrowing.       Estimated blood loss was minimal.      One moderate (circumferential scarring or stenosis; an endoscope may       pass) benign-appearing, intrinsic stenosis was found 45 cm from the       incisors. This measured 1.6 cm (inner diameter) and  was traversed. A TTS       dilator was passed through the scope. Dilation with a 15-16.5-18 mm       balloon dilator was performed to 18 mm under fluoroscopic guidance. The       dilation site was examined and showed moderate improvement in luminal       narrowing. Estimated blood loss was minimal.      A medium-sized hiatal hernia was present and noted on retroflexion.      The exam of the stomach was otherwise normal.      The examined duodenum was normal.      Diffuse candidiasis was found in the entire esophagus. Cells for        cytology were obtained by brushing. Estimated blood loss: none.      Food was found in the entire esophagus. Impression:               - Benign-appearing esophageal stenosis. Dilated.                           - Benign-appearing esophageal stenosis. Dilated.                           - Medium-sized hiatal hernia.                           - Normal examined duodenum.                           - Monilial esophagitis. Cells for cytology obtained.                           - Food in the esophagus. Moderate Sedation:      N/A - MAC procedure Recommendation:           - Await pathology results.                           - Return to GI clinic in 1 month.                           - Advance diet as tolerated and full liquid diet.                           - Continue present medications.                           - Patient has a contact number available for                            emergencies. The signs and symptoms of potential                            delayed complications were discussed with the                            patient. Return to normal activities tomorrow.  Written discharge instructions were provided to the                            patient. Procedure Code(s):        --- Professional ---                           [phone], Esophagogastroduodenoscopy, flexible,                            transoral; with transendoscopic balloon dilation of                            esophagus (less than 30 mm diameter)                           74360, Intraluminal dilation of strictures and/or                            obstructions (eg, esophagus), radiological                            supervision and interpretation Diagnosis Code(s):        --- Professional ---                           R13.10, Dysphagia, unspecified                           K22.2, Esophageal obstruction                           W29.937J, Food in esophagus causing other injury,                             initial encounter                           K44.9, Diaphragmatic hernia without obstruction or                            gangrene                           B37.81, Candidal esophagitis CPT copyright 2016 American Medical Association. All rights reserved. The codes documented in this report are preliminary and upon coder review may  be revised to meet current compliance requirements. Lear Ng, MD 01/14/2017 3:27:22 PM This report has been signed electronically. Number of Addenda: 0

## 2017-01-14 NOTE — Discharge Instructions (Signed)

## 2017-01-14 NOTE — Transfer of Care (Signed)
Immediate Anesthesia Transfer of Care Note  Patient: [name]  Procedure(s) Performed: Procedure(s): ESOPHAGOGASTRODUODENOSCOPY (EGD) WITH PROPOFOL (N/A) BALLOON DILATION (N/A) SAVORY DILATION (N/A)  Patient Location: Endoscopy Unit  Anesthesia Type:MAC  Level of Consciousness: awake and patient cooperative  Airway & Oxygen Therapy: Patient Spontanous Breathing  Post-op Assessment: Report given to RN and Post -op Vital signs reviewed and stable  Post vital signs: Reviewed and stable  Last Vitals:  Vitals:   01/14/17 1221  BP: (!) 137/99  Pulse: 84  Resp: 11  Temp: 36.9 C    Last Pain:  Vitals:   01/14/17 1221  TempSrc: Oral  PainSc: 4       Patients Stated Pain Goal: 2 (79/15/05 6979)  Complications: No apparent anesthesia complications

## 2017-01-14 NOTE — Anesthesia Preprocedure Evaluation (Addendum)
Anesthesia Evaluation  Patient identified by MRN, date of birth, ID band Patient awake    Reviewed: Allergy & Precautions, NPO status , Patient's Chart, lab work & pertinent test results  History of Anesthesia Complications Negative for: history of anesthetic complications  Airway Mallampati: I   Neck ROM: full    Dental  (+) Teeth Intact   Pulmonary asthma ,    breath sounds clear to auscultation       Cardiovascular negative cardio ROS   Rhythm:regular Rate:Normal     Neuro/Psych PSYCHIATRIC DISORDERS Anxiety Depression Bipolar Disorder Schizophrenia negative neurological ROS     GI/Hepatic PUD, GERD  ,(+) Hepatitis -, B  Endo/Other    Renal/GU      Musculoskeletal   Abdominal   Peds  Hematology  (+) HIV,   Anesthesia Other Findings   Reproductive/Obstetrics                             Anesthesia Physical  Anesthesia Plan  ASA: II  Anesthesia Plan: MAC   Post-op Pain Management:    Induction: Intravenous  Airway Management Planned: Simple Face Mask  Additional Equipment: None  Intra-op Plan:   Post-operative Plan:   Informed Consent: I have reviewed the patients History and Physical, chart, labs and discussed the procedure including the risks, benefits and alternatives for the proposed anesthesia with the patient or authorized representative who has indicated his/her understanding and acceptance.   Dental advisory given  Plan Discussed with: Anesthesiologist and Surgeon  Anesthesia Plan Comments:         Anesthesia Quick Evaluation  

## 2017-01-15 ENCOUNTER — Encounter (HOSPITAL_COMMUNITY): Payer: Self-pay | Admitting: Gastroenterology

## 2017-01-15 NOTE — Anesthesia Postprocedure Evaluation (Addendum)
Anesthesia Post Note  Patient: [name]  Procedure(s) Performed: Procedure(s) (LRB): ESOPHAGOGASTRODUODENOSCOPY (EGD) WITH PROPOFOL (N/A) BALLOON DILATION (N/A)  Patient location during evaluation: Endoscopy Anesthesia Type: MAC Level of consciousness: awake Pain management: pain level controlled Vital Signs Assessment: post-procedure vital signs reviewed and stable Respiratory status: spontaneous breathing, nonlabored ventilation, respiratory function stable and patient connected to nasal cannula oxygen Cardiovascular status: stable and blood pressure returned to baseline Anesthetic complications: no       Last Vitals:  Vitals:   01/14/17 1540 01/14/17 1550  BP: (!) 145/93 (!) 145/102  Pulse: 72 69  Resp: 16 16  Temp:      Last Pain:  Vitals:   01/14/17 1550  TempSrc:   PainSc: 4                  [name]

## 2017-04-02 ENCOUNTER — Encounter (HOSPITAL_COMMUNITY): Payer: Self-pay | Admitting: Emergency Medicine

## 2017-04-02 ENCOUNTER — Emergency Department (HOSPITAL_COMMUNITY): Payer: Medicaid Other

## 2017-04-02 ENCOUNTER — Emergency Department (HOSPITAL_COMMUNITY)
Admission: EM | Admit: 2017-04-02 | Discharge: 2017-04-02 | Disposition: A | Discharge status: Home / Self Care | Payer: Medicaid Other | Attending: Emergency Medicine | Admitting: Emergency Medicine

## 2017-04-02 ENCOUNTER — Encounter (HOSPITAL_COMMUNITY): Payer: Self-pay

## 2017-04-02 ENCOUNTER — Emergency Department (HOSPITAL_COMMUNITY)
Admission: EM | Admit: 2017-04-02 | Discharge: 2017-04-03 | Disposition: A | Discharge status: Home / Self Care | Payer: Medicaid Other | Source: Home / Self Care | Attending: Emergency Medicine | Admitting: Emergency Medicine

## 2017-04-02 DIAGNOSIS — J45909 Unspecified asthma, uncomplicated: Secondary | ICD-10-CM | POA: Diagnosis not present

## 2017-04-02 DIAGNOSIS — R112 Nausea with vomiting, unspecified: Secondary | ICD-10-CM | POA: Diagnosis not present

## 2017-04-02 DIAGNOSIS — Z91010 Allergy to peanuts: Secondary | ICD-10-CM | POA: Insufficient documentation

## 2017-04-02 DIAGNOSIS — Z79899 Other long term (current) drug therapy: Secondary | ICD-10-CM | POA: Insufficient documentation

## 2017-04-02 DIAGNOSIS — R109 Unspecified abdominal pain: Secondary | ICD-10-CM

## 2017-04-02 DIAGNOSIS — R1115 Cyclical vomiting syndrome unrelated to migraine: Secondary | ICD-10-CM

## 2017-04-02 LAB — CBC
HCT: 27.6 % — ABNORMAL LOW (ref 36.0–46.0)
HEMOGLOBIN: 8.9 g/dL — AB (ref 12.0–15.0)
MCH: 21.5 pg — AB (ref 26.0–34.0)
MCHC: 32.2 g/dL (ref 30.0–36.0)
MCV: 66.8 fL — ABNORMAL LOW (ref 78.0–100.0)
Platelets: 262 10*3/uL (ref 150–400)
RBC: 4.13 MIL/uL (ref 3.87–5.11)
RDW: 17.3 % — ABNORMAL HIGH (ref 11.5–15.5)
WBC: 5.5 10*3/uL (ref 4.0–10.5)

## 2017-04-02 LAB — COMPREHENSIVE METABOLIC PANEL
ALBUMIN: 3.7 g/dL (ref 3.5–5.0)
ALK PHOS: 215 U/L — AB (ref 38–126)
ALT: 74 U/L — ABNORMAL HIGH (ref 14–54)
ANION GAP: 10 (ref 5–15)
AST: 118 U/L — AB (ref 15–41)
BUN: 18 mg/dL (ref 6–20)
CALCIUM: 9.5 mg/dL (ref 8.9–10.3)
CO2: 26 mmol/L (ref 22–32)
Chloride: 104 mmol/L (ref 101–111)
Creatinine, Ser: 0.76 mg/dL (ref 0.44–1.00)
GFR calc Af Amer: >60 mL/min (ref >60)
GFR calc non Af Amer: >60 mL/min (ref >60)
GLUCOSE: 124 mg/dL — AB (ref 65–99)
Potassium: 3.8 mmol/L (ref 3.5–5.1)
SODIUM: 140 mmol/L (ref 135–145)
TOTAL PROTEIN: 7.8 g/dL (ref 6.5–8.1)
Total Bilirubin: 1 mg/dL (ref 0.3–1.2)

## 2017-04-02 LAB — LIPASE, BLOOD: Lipase: 31 U/L (ref 11–51)

## 2017-04-02 MED ORDER — ONDANSETRON HCL 4 MG/2ML IJ SOLN
4.0000 mg | Freq: Once | INTRAMUSCULAR | Status: AC
  Administered 2017-04-02: 4 mg via INTRAVENOUS
  Filled 2017-04-02: qty 2

## 2017-04-02 MED ORDER — SODIUM CHLORIDE 0.9 % IV BOLUS (SEPSIS)
1000.0000 mL | Freq: Once | INTRAVENOUS | Status: AC
  Administered 2017-04-02: 1000 mL via INTRAVENOUS

## 2017-04-02 MED ORDER — MORPHINE SULFATE (PF) 4 MG/ML IV SOLN
2.0000 mg | Freq: Once | INTRAVENOUS | Status: AC
  Administered 2017-04-02: 2 mg via INTRAVENOUS
  Filled 2017-04-02: qty 1

## 2017-04-02 MED ORDER — MORPHINE SULFATE (PF) 4 MG/ML IV SOLN
4.0000 mg | Freq: Once | INTRAVENOUS | Status: AC
  Administered 2017-04-02: 4 mg via INTRAVENOUS
  Filled 2017-04-02: qty 1

## 2017-04-02 NOTE — ED Provider Notes (Signed)
[facility] DEPT Provider Note   CSN: [id number] Arrival date & time: 04/02/17  2000     History   Chief Complaint Chief Complaint  Patient presents with  . Abdominal Pain    HPI [name] is a 41 y.o. female.  The history is provided by the patient.  Abdominal Pain   This is a recurrent problem. Episode onset: 2 months. Episode frequency: intermittent. The problem has not changed since onset.Associated with: constipation. The pain is located in the RUQ, epigastric region and LUQ. The quality of the pain is sharp and cramping. The pain is moderate. Associated symptoms include anorexia, nausea, vomiting and constipation. Pertinent negatives include diarrhea. The symptoms are aggravated by eating (cold in the area). Relieved by: heat.   Reports decreased PO tolerance for 1.5 weeks. Has not eaten in 12 days.  H/o HIV on HAART and complaint. Patient also reports history of a peptic ulcer disease; denies any melena.   Past Medical History:  Diagnosis Date  . Acute psychosis 03/10/12   2nd admission in last wk for this  . Angio-edema   . Anxiety   . Asthma    inhaler 2xday  . Bipolar disorder ([place])   . Depression   . GERD (gastroesophageal reflux disease)   . Hepatitis B    /E-chart  . History of blood transfusion   . HIV positive ([place])   . Microcytic anemia    h/o per E-chart  . Noncompliance with medication regimen    /e-chart  . 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 Pyelonephritis    h/o per E-chart  . Shortness of breath dyspnea    due to Asthma  . Thyroid disease   . Urticaria     Patient Active Problem List   Diagnosis Date Noted  . Protein-calorie malnutrition, severe 10/26/2016  . Status post thyroidectomy 10/25/2016  . Asthma 10/25/2016  . 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 GERD (gastroesophageal reflux disease) 10/25/2016  . Candidal esophagitis ([place]) 10/25/2016  . Dysphagia 09/11/2015  .  Esophageal stricture 12/06/2014  . 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 Bipolar disorder ([place]) 04/01/2012  . Human immunodeficiency virus (HIV) disease ([place]) 02/28/2009  . Genital herpes 02/28/2009  . Microcytic anemia 02/27/2009    Past Surgical History:  Procedure Laterality Date  . BALLOON DILATION N/A 02/14/2015   Procedure: BALLOON DILATION;  Surgeon: Lear Ng, MD;  Location: [facility] ENDOSCOPY;  Service: Endoscopy;  Laterality: N/A;  . BALLOON DILATION N/A 10/25/2016   Procedure: BALLOON DILATION;  Surgeon: Wilford Corner, MD;  Location: [facility] ENDOSCOPY;  Service: Endoscopy;  Laterality: N/A;  . BALLOON DILATION N/A 01/14/2017   Procedure: BALLOON DILATION;  Surgeon: Wilford Corner, MD;  Location: [facility] ENDOSCOPY;  Service: Endoscopy;  Laterality: N/A;  . ESOPHAGOGASTRODUODENOSCOPY N/A 07/13/2013   Procedure: ESOPHAGOGASTRODUODENOSCOPY (EGD);  Surgeon: Beryle Beams, MD;  Location: [facility] ENDOSCOPY;  Service: Endoscopy;  Laterality: N/A;  . ESOPHAGOGASTRODUODENOSCOPY N/A 08/12/2014   Procedure: ESOPHAGOGASTRODUODENOSCOPY (EGD);  Surgeon: Milus Banister, MD;  Location: [facility] ENDOSCOPY;  Service: Endoscopy;  Laterality: N/A;  . ESOPHAGOGASTRODUODENOSCOPY N/A 02/14/2015   Procedure: ESOPHAGOGASTRODUODENOSCOPY (EGD);  Surgeon: Lear Ng, MD;  Location: [facility] ENDOSCOPY;  Service: Endoscopy;  Laterality: N/A;  . ESOPHAGOGASTRODUODENOSCOPY N/A 10/25/2016   Procedure: ESOPHAGOGASTRODUODENOSCOPY (EGD);  Surgeon: Wilford Corner, MD;  Location: [facility] ENDOSCOPY;  Service: Endoscopy;  Laterality: N/A;  . ESOPHAGOGASTRODUODENOSCOPY (EGD) WITH PROPOFOL N/A 04/11/2015   Procedure: ESOPHAGOGASTRODUODENOSCOPY (EGD) WITH PROPOFOL;  Surgeon: Evette Doffing  Michail Sermon, MD;  Location: [facility] ENDOSCOPY;  Service: Endoscopy;  Laterality: N/A;  . ESOPHAGOGASTRODUODENOSCOPY (EGD) WITH PROPOFOL N/A 09/11/2015   Procedure: ESOPHAGOGASTRODUODENOSCOPY (EGD) WITH PROPOFOL;  Surgeon: Wilford Corner, MD;  Location: [facility]  ENDOSCOPY;  Service: Endoscopy;  Laterality: N/A;  . ESOPHAGOGASTRODUODENOSCOPY (EGD) WITH PROPOFOL N/A 01/14/2017   Procedure: ESOPHAGOGASTRODUODENOSCOPY (EGD) WITH PROPOFOL;  Surgeon: Wilford Corner, MD;  Location: [facility] ENDOSCOPY;  Service: Endoscopy;  Laterality: N/A;  . SAVORY DILATION N/A 02/14/2015   Procedure: SAVORY DILATION;  Surgeon: Lear Ng, MD;  Location: [facility];  Service: Endoscopy;  Laterality: N/A;  no xray needed  . SAVORY DILATION N/A 04/11/2015   Procedure: SAVORY DILATION;  Surgeon: Wilford Corner, MD;  Location: [facility] ENDOSCOPY;  Service: Endoscopy;  Laterality: N/A;  . SAVORY DILATION N/A 09/11/2015   Procedure: SAVORY DILATION;  Surgeon: Wilford Corner, MD;  Location: [facility] ENDOSCOPY;  Service: Endoscopy;  Laterality: N/A;  . THYROID LOBECTOMY  08/2002   left & isthmectomy; for benign thyroid adenoma/E-chart  . THYROID SURGERY    . TUBAL LIGATION  07/2002   /E-chart    OB History    No data available       Home Medications    Prior to Admission medications   Medication Sig Start Date End Date Taking? Authorizing Provider  elvitegravir-cobicistat-emtricitabine-tenofovir (GENVOYA) 150-150-200-10 MG TABS tablet Take 1 tablet by mouth daily with breakfast. 11/12/16  Yes Michel Bickers, MD  pantoprazole (PROTONIX) 40 MG tablet Take 1 tablet (40 mg total) by mouth daily. 11/12/16  Yes Michel Bickers, MD  albuterol (PROAIR HFA) 108 (90 BASE) MCG/ACT inhaler Inhale 2 puffs into the lungs every 4 (four) hours as needed for wheezing or shortness of breath (cough). Patient not taking: Reported on 11/12/2016 11/16/15   Gean Quint, MD  benztropine (COGENTIN) 1 MG tablet Take 1 tablet (1 mg total) by mouth daily. Patient not taking: Reported on 04/02/2017 11/12/16   Michel Bickers, MD  EPINEPHrine 0.3 mg/0.3 mL IJ SOAJ injection Inject 0.3 mLs (0.3 mg total) into the muscle once. Patient not taking: Reported on 11/12/2016 12/20/15   Gean Quint, MD  fluconazole  (DIFLUCAN) 100 MG tablet Take 1 tablet (100 mg total) by mouth once a week. Patient not taking: Reported on 04/02/2017 11/12/16   Michel Bickers, MD  OLANZapine (ZYPREXA) 5 MG tablet Take 1 tablet (5 mg total) by mouth at bedtime. Patient not taking: Reported on 04/02/2017 11/12/16   Michel Bickers, MD    Family History Family History  Problem Relation Age of Onset  . Cancer Mother   . Diabetes Mother   . Diabetes Father   . Heart disease Father   . Diabetes Sister   . Urticaria Sister   . Asthma Son   . Allergic rhinitis Neg Hx   . Eczema Neg Hx   . Immunodeficiency Neg Hx     Social History Social History  Substance Use Topics  . Smoking status: Never Smoker  . Smokeless tobacco: Never Used  . Alcohol use No     Allergies   Dapsone; Other; Peanut-containing drug products; Shellfish allergy; Bactrim; Orange fruit; and Sulfa antibiotics   Review of Systems Review of Systems  Gastrointestinal: Positive for abdominal pain, anorexia, constipation, nausea and vomiting. Negative for diarrhea.  All other systems are reviewed and are negative for acute change except as noted in the HPI    Physical Exam Updated Vital Signs BP 101/73 (BP Location: Right Arm)   Pulse (!) 120   Temp  98.8 F (37.1 C) (Oral)   Resp 18   Ht 6' (1.829 m)   Wt 142 lb (64.4 kg)   LMP 03/03/2017   SpO2 100%   BMI 19.26 kg/m   Physical Exam  Constitutional: She is oriented to person, place, and time. She appears well-developed and well-nourished. No distress.  HENT:  Head: Normocephalic and atraumatic.  Nose: Nose normal.  Eyes: Conjunctivae and EOM are normal. Pupils are equal, round, and reactive to light. Right eye exhibits no discharge. Left eye exhibits no discharge. No scleral icterus.  Neck: Normal range of motion. Neck supple.  Cardiovascular: Normal rate and regular rhythm.  Exam reveals no gallop and no friction rub.   No murmur heard. Pulmonary/Chest: Effort normal and breath  sounds normal. No stridor. No respiratory distress. She has no rales.  Abdominal: Soft. She exhibits no distension. There is tenderness in the right upper quadrant and epigastric area. There is positive Murphy's sign.  Musculoskeletal: She exhibits no edema or tenderness.  Neurological: She is alert and oriented to person, place, and time.  Skin: Skin is warm and dry. No rash noted. She is not diaphoretic. No erythema.  Psychiatric: She has a normal mood and affect.  Vitals reviewed.    ED Treatments / Results  Labs (all labs ordered are listed, but only abnormal results are displayed) Labs Reviewed  COMPREHENSIVE METABOLIC PANEL - Abnormal; Notable for the following:       Result Value   Glucose, Bld 124 (*)    AST 118 (*)    ALT 74 (*)    Alkaline Phosphatase 215 (*)    All other components within normal limits  CBC - Abnormal; Notable for the following:    Hemoglobin 8.9 (*)    HCT 27.6 (*)    MCV 66.8 (*)    MCH 21.5 (*)    RDW 17.3 (*)    All other components within normal limits  LIPASE, BLOOD  POC URINE PREG, ED    EKG  EKG Interpretation None       Radiology US Abdomen Limited Ruq  Result Date: 04/02/2017 CLINICAL DATA:  Initial evaluation for acute right upper quadrant pain with vomiting. EXAM: US ABDOMEN LIMITED - RIGHT UPPER QUADRANT COMPARISON:  Prior CT from 10/16/2016. FINDINGS: Gallbladder: No gallstones or wall thickening visualized. No sonographic Murphy sign noted by sonographer. Common bile duct: Diameter: 3-4 mm Liver: No focal lesion identified. Within normal limits in parenchymal echogenicity. IMPRESSION: Normal right upper quadrant ultrasound. No cholelithiasis, evidence for acute cholecystitis, or biliary dilatation. Electronically Signed   By: Jeannine Boga M.D.   On: 04/02/2017 23:15    Procedures Procedures (including critical care time)  Medications Ordered in ED Medications  sucralfate ([name]) tablet 1 g (not administered)  gi  cocktail (Maalox,Lidocaine,Donnatal) (not administered)  sodium chloride 0.9 % bolus 1,000 mL (1,000 mLs Intravenous New Bag/Given 04/02/17 2247)  ondansetron (ZOFRAN) injection 4 mg (4 mg Intravenous Given 04/02/17 2247)  morphine 4 MG/ML injection 4 mg (4 mg Intravenous Given 04/02/17 2247)  morphine 4 MG/ML injection 2 mg (2 mg Intravenous Given 04/02/17 2344)     Initial Impression / Assessment and Plan / ED Course  I have reviewed the triage vital signs and the nursing notes.  Pertinent labs & imaging results that were available during my care of the patient were reviewed by me and considered in my medical decision making (see chart for details).     Right upper quadrant abdominal pain with  positive Murphy sign. Screening labs without leukocytosis on CBC and baseline hemoglobin. CMP with intact renal function however LFTs elevated. Right upper quadrant ultrasound obtained to assess for any biliary disease which no evidence of acute cholecystitis. Patient provided with IV pain medicine, nausea medicine and fluids. Provided with oral gi cocktail and carafate.   Currently pt is getting IVF and being PO challenged.  If she is able to tolerate PO, she should be safe for DC with Zofran Rx and close PCP follow up.  Patient care turned over to Dr Tomi Bamberger at 0100. Patient case and results discussed in detail; please see their note for further ED managment.      Final Clinical Impressions(s) / ED Diagnoses   Final diagnoses:  Abdominal pain  Non-intractable cyclical vomiting with nausea      Fatima Blank, MD 04/03/17 [phone]

## 2017-04-02 NOTE — ED Triage Notes (Signed)
States abdominal pain for 5 weeks and unable to eat  X 12 days states pain is severe and cant keep anything down at this time no fever voiced.

## 2017-04-02 NOTE — ED Notes (Signed)
Pt stated "I am leaving after you take my blood. I have been in pain for over a month and I am not going to sit around and wait any longer. I would rather be at home in pain than here. "

## 2017-04-02 NOTE — ED Triage Notes (Signed)
Patient c/o upper abd pain x 5 weeks and hurts worse after drinking PO fluids. Patient been having constipation for while and been taking MOM. Patient states that she also had cough and spitting up and vomiting.  Patient states that she hasnt been able to eat in 11 days due to the pain when drinking.

## 2017-04-02 NOTE — ED Notes (Signed)
Asked pt if she could give a urine sample, but said she's not able to. 

## 2017-04-03 LAB — POC URINE PREG, ED: Preg Test, Ur: NEGATIVE

## 2017-04-03 MED ORDER — SODIUM CHLORIDE 0.9 % IV BOLUS (SEPSIS)
1000.0000 mL | Freq: Once | INTRAVENOUS | Status: AC
  Administered 2017-04-03: 1000 mL via INTRAVENOUS

## 2017-04-03 MED ORDER — FENTANYL CITRATE (PF) 100 MCG/2ML IJ SOLN
50.0000 ug | Freq: Once | INTRAMUSCULAR | Status: AC
  Administered 2017-04-03: 50 ug via INTRAVENOUS
  Filled 2017-04-03: qty 2

## 2017-04-03 MED ORDER — ONDANSETRON 4 MG PO TBDP: 4.0000 mg | ORAL_TABLET | Freq: Three times a day (TID) | ORAL | 0 refills | Status: AC | PRN

## 2017-04-03 MED ORDER — SUCRALFATE 1 G PO TABS
1.0000 g | ORAL_TABLET | Freq: Once | ORAL | Status: AC
  Administered 2017-04-03: 1 g via ORAL
  Filled 2017-04-03: qty 1

## 2017-04-03 MED ORDER — SUCRALFATE 1 G PO TABS: 1.0000 g | ORAL_TABLET | Freq: Three times a day (TID) | ORAL | 0 refills | Status: DC

## 2017-04-03 MED ORDER — GI COCKTAIL ~~LOC~~
30.0000 mL | Freq: Once | ORAL | Status: AC
  Administered 2017-04-03: 30 mL via ORAL
  Filled 2017-04-03: qty 30

## 2017-04-03 NOTE — ED Provider Notes (Signed)
Patient left at change of shift to be discharged when she is hydrated.  Recheck at 02:45 AM pt has had her IV fluids and is drinking oral fluids. States her mouth still feels dry and she hasn't had urine output yet. Patient states she is still having some right upper quadrant pain. More IV fluids were ordered.  05:30 AM nurse reports IV fluids are in and she has had urine output. Pt discharged.    Rolland Porter, MD, Barbette Or, MD 04/03/17 [phone]

## 2017-04-03 NOTE — ED Notes (Signed)
Informed MD that pt reports her pain is increasing. Asked MD if pt may have another dose of pain medication since last dose given was at 2247 and pt will not be discharged any time soon. MD denied this writer any further orders for pain medication.

## 2017-04-03 NOTE — ED Notes (Signed)
Pt still unable to give urine sample, but aware that we need one.

## 2017-04-24 ENCOUNTER — Other Ambulatory Visit: Payer: Self-pay | Admitting: Gastroenterology

## 2017-04-29 NOTE — Progress Notes (Signed)
I was unable to reach Mr [name], I left instructions on voice mail.  I instructed patient to arrive at 7:15 AM , main entrance, free valet parking register in [place] office. I instructed patient to not eat or drink after midnight, to not take any medication in am since we do not have an updated list.  I instructed patient to call Endo after 6:00 am if he had questions regarding particular medications that he felt that he should take.  I instructed patient that he needs a driver and that he needs someone to stay with him the rest of the day.

## 2017-04-29 NOTE — Anesthesia Preprocedure Evaluation (Signed)
Anesthesia Evaluation  Patient identified by MRN, date of birth, ID band Patient awake    Reviewed: Allergy & Precautions, NPO status , Patient's Chart, lab work & pertinent test results  History of Anesthesia Complications Negative for: history of anesthetic complications  Airway Mallampati: I   Neck ROM: full    Dental  (+) Teeth Intact   Pulmonary asthma ,    breath sounds clear to auscultation       Cardiovascular negative cardio ROS   Rhythm:regular Rate:Normal     Neuro/Psych PSYCHIATRIC DISORDERS Anxiety Depression Bipolar Disorder Schizophrenia negative neurological ROS     GI/Hepatic PUD, GERD  ,(+) Hepatitis -, B  Endo/Other    Renal/GU      Musculoskeletal   Abdominal   Peds  Hematology  (+) HIV,   Anesthesia Other Findings   Reproductive/Obstetrics                             Anesthesia Physical  Anesthesia Plan  ASA: II  Anesthesia Plan: MAC   Post-op Pain Management:    Induction: Intravenous  Airway Management Planned: Simple Face Mask  Additional Equipment: None  Intra-op Plan:   Post-operative Plan:   Informed Consent: I have reviewed the patients History and Physical, chart, labs and discussed the procedure including the risks, benefits and alternatives for the proposed anesthesia with the patient or authorized representative who has indicated his/her understanding and acceptance.   Dental advisory given  Plan Discussed with: Anesthesiologist and Surgeon  Anesthesia Plan Comments:         Anesthesia Quick Evaluation

## 2017-04-30 ENCOUNTER — Ambulatory Visit (HOSPITAL_COMMUNITY): Payer: Medicaid Other

## 2017-04-30 ENCOUNTER — Encounter (HOSPITAL_COMMUNITY): Payer: Self-pay

## 2017-04-30 ENCOUNTER — Ambulatory Visit (HOSPITAL_COMMUNITY): Payer: Medicaid Other | Admitting: Anesthesiology

## 2017-04-30 ENCOUNTER — Ambulatory Visit (HOSPITAL_COMMUNITY)
Admission: RE | Admit: 2017-04-30 | Discharge: 2017-04-30 | Disposition: A | Discharge status: Home / Self Care | Payer: Medicaid Other | Source: Ambulatory Visit | Attending: Gastroenterology | Admitting: Gastroenterology

## 2017-04-30 ENCOUNTER — Encounter (HOSPITAL_COMMUNITY)
Admission: RE | Disposition: A | Discharge status: Home / Self Care | Payer: Self-pay | Source: Ambulatory Visit | Attending: Gastroenterology

## 2017-04-30 DIAGNOSIS — F209 Schizophrenia, unspecified: Secondary | ICD-10-CM | POA: Diagnosis not present

## 2017-04-30 DIAGNOSIS — F319 Bipolar disorder, unspecified: Secondary | ICD-10-CM | POA: Insufficient documentation

## 2017-04-30 DIAGNOSIS — Z79899 Other long term (current) drug therapy: Secondary | ICD-10-CM | POA: Insufficient documentation

## 2017-04-30 DIAGNOSIS — B009 Herpesviral infection, unspecified: Secondary | ICD-10-CM | POA: Insufficient documentation

## 2017-04-30 DIAGNOSIS — Z21 Asymptomatic human immunodeficiency virus [HIV] infection status: Secondary | ICD-10-CM | POA: Diagnosis not present

## 2017-04-30 DIAGNOSIS — K222 Esophageal obstruction: Secondary | ICD-10-CM | POA: Diagnosis not present

## 2017-04-30 DIAGNOSIS — J45909 Unspecified asthma, uncomplicated: Secondary | ICD-10-CM | POA: Diagnosis not present

## 2017-04-30 DIAGNOSIS — K219 Gastro-esophageal reflux disease without esophagitis: Secondary | ICD-10-CM | POA: Diagnosis not present

## 2017-04-30 DIAGNOSIS — B3781 Candidal esophagitis: Secondary | ICD-10-CM | POA: Insufficient documentation

## 2017-04-30 DIAGNOSIS — R1310 Dysphagia, unspecified: Secondary | ICD-10-CM

## 2017-04-30 HISTORY — PX: SAVORY DILATION: SHX5439

## 2017-04-30 HISTORY — PX: ESOPHAGOGASTRODUODENOSCOPY (EGD) WITH PROPOFOL: SHX5813

## 2017-04-30 HISTORY — PX: BALLOON DILATION: SHX5330

## 2017-04-30 SURGERY — ESOPHAGOGASTRODUODENOSCOPY (EGD) WITH PROPOFOL
Anesthesia: Monitor Anesthesia Care

## 2017-04-30 MED ORDER — PROPOFOL 500 MG/50ML IV EMUL
INTRAVENOUS | Status: DC | PRN
  Administered 2017-04-30: 80 ug/kg/min via INTRAVENOUS

## 2017-04-30 MED ORDER — SODIUM CHLORIDE 0.9 % IV SOLN: INTRAVENOUS | Status: DC

## 2017-04-30 MED ORDER — LACTATED RINGERS IV SOLN
INTRAVENOUS | Status: DC | PRN
  Administered 2017-04-30: 09:00:00 via INTRAVENOUS

## 2017-04-30 MED ORDER — MIDAZOLAM HCL 5 MG/5ML IJ SOLN
INTRAMUSCULAR | Status: DC | PRN
  Administered 2017-04-30: 1 mg via INTRAVENOUS

## 2017-04-30 MED ORDER — PROPOFOL 10 MG/ML IV BOLUS
INTRAVENOUS | Status: DC | PRN
  Administered 2017-04-30 (×6): 20 mg via INTRAVENOUS

## 2017-04-30 SURGICAL SUPPLY — 15 items

## 2017-04-30 NOTE — Discharge Instructions (Signed)
YOU HAD AN ENDOSCOPIC PROCEDURE TODAY: Refer to the procedure report and other information in the discharge instructions given to you for any specific questions about what was found during the examination. If this information does not answer your questions, please call [facility] office at [phone] to clarify.   YOU SHOULD EXPECT: Some feelings of bloating in the abdomen. Passage of more gas than usual. Walking can help get rid of the air that was put into your GI tract during the procedure and reduce the bloating. If you had a lower endoscopy (such as a colonoscopy or flexible sigmoidoscopy) you may notice spotting of blood in your stool or on the toilet paper. Some abdominal soreness may be present for a day or two, also.  DIET: Your first meal following the procedure should be a light meal and then it is ok to progress to your normal diet. A half-sandwich or bowl of soup is an example of a good first meal. Heavy or fried foods are harder to digest and may make you feel nauseous or bloated. Drink plenty of fluids but you should avoid alcoholic beverages for 24 hours. If you had a esophageal dilation, please see attached instructions for diet.   ACTIVITY: Your care partner should take you home directly after the procedure. You should plan to take it easy, moving slowly for the rest of the day. You can resume normal activity the day after the procedure however YOU SHOULD NOT DRIVE, use power tools, machinery or perform tasks that involve climbing or major physical exertion for 24 hours (because of the sedation medicines used during the test).   SYMPTOMS TO REPORT IMMEDIATELY: A gastroenterologist can be reached at any hour. Please call [phone]  for any of the following symptoms:  Following lower endoscopy (colonoscopy, flexible sigmoidoscopy) Excessive amounts of blood in the stool  Significant tenderness, worsening of abdominal pains  Swelling of the abdomen that is new, acute  Fever of 100 or  higher  Following upper endoscopy (EGD, EUS, ERCP, esophageal dilation) Vomiting of blood or coffee ground material  New, significant abdominal pain  New, significant chest pain or pain under the shoulder blades  Painful or persistently difficult swallowing  New shortness of breath  Black, tarry-looking or red, bloody stools  FOLLOW UP:  If any biopsies were taken you will be contacted by phone or by letter within the next 1-3 weeks. Call [phone]  if you have not heard about the biopsies in 3 weeks.  Please also call with any specific questions about appointments or follow up tests.  Office will call in Diflucan to take 200 mg by mouth once a day for 3 weeks.

## 2017-04-30 NOTE — H&P (Signed)
Date of Initial H&P: 04/23/17  History reviewed, patient examined, no change in status, stable for surgery.  

## 2017-04-30 NOTE — Interval H&P Note (Signed)
History and Physical Interval Note:  04/30/2017 8:31 AM  [name]  has presented today for surgery, with the diagnosis of esoph.stricture  The various methods of treatment have been discussed with the patient and family. After consideration of risks, benefits and other options for treatment, the patient has consented to  Procedure(s): ESOPHAGOGASTRODUODENOSCOPY (EGD) WITH PROPOFOL (N/A) BALLOON DILATION (N/A) SAVORY DILATION (N/A) as a surgical intervention .  The patient's history has been reviewed, patient examined, no change in status, stable for surgery.  I have reviewed the patient's chart and labs.  Questions were answered to the patient's satisfaction.     Keeler C.

## 2017-04-30 NOTE — Op Note (Signed)
[facility] Patient Name: [name] Procedure Date : 04/30/2017 MRN: [id number] Attending MD: Lear Ng , MD Date of Birth: 1976/04/06 CSN: [id number] Age: 41 Admit Type: Outpatient Procedure:                Upper GI endoscopy Indications:              Dysphagia, Stricture of the esophagus Providers:                Lear Ng, MD, Burtis Junes, RN, Lillie Fragmin, RN Referring MD:              Medicines:                Propofol per Anesthesia, Monitored Anesthesia Care Complications:            No immediate complications. Estimated Blood Loss:     Estimated blood loss was minimal. Procedure:                Pre-Anesthesia Assessment:                           - Prior to the procedure, a History and Physical                            was performed, and patient medications and                            allergies were reviewed. The patient's tolerance of                            previous anesthesia was also reviewed. The risks                            and benefits of the procedure and the sedation                            options and risks were discussed with the patient.                            All questions were answered, and informed consent                            was obtained. Prior Anticoagulants: The patient has                            taken no previous anticoagulant or antiplatelet                            agents. ASA Grade Assessment: II - A patient with                            mild systemic disease. After reviewing the risks  and benefits, the patient was deemed in                            satisfactory condition to undergo the procedure.                           After obtaining informed consent, the endoscope was                            passed under direct vision. Throughout the                            procedure, the patient's blood pressure, pulse, and                     oxygen saturations were monitored continuously. The                            [id number] ([id number]) scope was introduced through the                            mouth, and advanced to the second part of duodenum.                            The upper GI endoscopy was performed with                            difficulty due to stricture. Successful completion                            of the procedure was aided by performing the                            maneuvers documented (below) in this report. The                            patient tolerated the procedure fairly well. Scope In: Scope Out: Findings:      One severe (stenosis; an endoscope cannot pass) benign-appearing,       intrinsic stenosis was found 32 cm from the incisors. This measured 9 mm       (inner diameter) and was traversed after dilation. A guidewire was       placed under fluoroscopic guidance and the scope was withdrawn. Dilation       was performed with a Savary dilator with no resistance at 10 mm, mild       resistance at 11 mm and moderate resistance at 12.8 mm and 14 mm.       Estimated blood loss was minimal.      Diffuse candidiasis was found in the upper third of the esophagus and in       the middle third of the esophagus.      The Z-line was regular and was found 40 cm from the incisors.      Localized mildly erythematous mucosa without bleeding was found in the       prepyloric region of the stomach.  The exam of the stomach was otherwise normal.      The examined duodenum was normal. Impression:               - Benign-appearing esophageal stenosis. Dilated.                           - Monilial esophagitis.                           - Z-line regular, 40 cm from the incisors.                           - Erythematous mucosa in the prepyloric region of                            the stomach.                           - Normal examined duodenum.                           - No specimens  collected. Moderate Sedation:      N/A - MAC procedure Recommendation:           - Patient has a contact number available for                            emergencies. The signs and symptoms of potential                            delayed complications were discussed with the                            patient. Return to normal activities tomorrow.                            Written discharge instructions were provided to the                            patient.                           - Advance diet as tolerated and clear liquid diet.                           - Diflucan (fluconazole) 200 mg PO daily for 3                            weeks.                           - Return to my office in 1 month.                           - Continue present medications.                           -  Post procedure medication orders were given. Procedure Code(s):        --- Professional ---                           [phone], Esophagogastroduodenoscopy, flexible,                            transoral; with insertion of guide wire followed by                            passage of dilator(s) through esophagus over guide                            wire                           [id number], Intraluminal dilation of strictures and/or                            obstructions (eg, esophagus), radiological                            supervision and interpretation Diagnosis Code(s):        --- Professional ---                           R13.10, Dysphagia, unspecified                           K22.2, Esophageal obstruction                           B37.81, Candidal esophagitis                           K31.89, Other diseases of stomach and duodenum CPT copyright 2016 [employer]. All rights reserved. The codes documented in this report are preliminary and upon coder review may  be revised to meet current compliance requirements. Lear Ng, MD 04/30/2017 9:44:52 AM This report has been signed  electronically. Number of Addenda: 0

## 2017-04-30 NOTE — Transfer of Care (Signed)
Immediate Anesthesia Transfer of Care Note  Patient: [name]  Procedure(s) Performed: Procedure(s): ESOPHAGOGASTRODUODENOSCOPY (EGD) WITH PROPOFOL (N/A) BALLOON DILATION (N/A) SAVORY DILATION (N/A)  Patient Location: Endoscopy Unit  Anesthesia Type:MAC  Level of Consciousness: awake, alert  and sedated  Airway & Oxygen Therapy: Patient connected to nasal cannula oxygen  Post-op Assessment: Post -op Vital signs reviewed and stable  Post vital signs: unstable  Last Vitals:  Vitals:   04/30/17 0731  BP: (!) 137/92  Pulse: 87  Resp: 16  Temp: 37.4 C    Last Pain:  Vitals:   04/30/17 0731  TempSrc: Oral         Complications: No apparent anesthesia complications

## 2017-05-01 NOTE — Anesthesia Postprocedure Evaluation (Signed)
Anesthesia Post Note  Patient: [name]  Procedure(s) Performed: Procedure(s) (LRB): ESOPHAGOGASTRODUODENOSCOPY (EGD) WITH PROPOFOL (N/A) BALLOON DILATION (N/A) SAVORY DILATION (N/A)  Patient location during evaluation: PACU Anesthesia Type: MAC Level of consciousness: awake and alert Pain management: pain level controlled Vital Signs Assessment: post-procedure vital signs reviewed and stable Respiratory status: spontaneous breathing, nonlabored ventilation, respiratory function stable and patient connected to nasal cannula oxygen Cardiovascular status: stable and blood pressure returned to baseline Anesthetic complications: no       Last Vitals:  Vitals:   04/30/17 0950 04/30/17 1000  BP: (!) 142/86 (!) 144/94  Pulse: 68 66  Resp: 15 (!) 9  Temp:      Last Pain:  Vitals:   04/30/17 0937  TempSrc: Oral                 Kennetta Pavlovic EDWARD

## 2017-05-19 NOTE — Addendum Note (Signed)
Addendum  created 05/19/17 1033 by Nirvan Laban, MD   Sign clinical note    

## 2017-06-27 NOTE — Anesthesia Postprocedure Evaluation (Signed)
Anesthesia Post Note  Patient: [name]  Procedure(s) Performed: Procedure(s) (LRB): ESOPHAGOGASTRODUODENOSCOPY (EGD) WITH PROPOFOL (N/A) BALLOON DILATION (N/A) SAVORY DILATION (N/A)     Anesthesia Post Evaluation  Last Vitals:  Vitals:   04/30/17 0950 04/30/17 1000  BP: (!) 142/86 (!) 144/94  Pulse: 68 66  Resp: 15 (!) 9  Temp:      Last Pain:  Vitals:   04/30/17 0937  TempSrc: Oral                 Laasya Peyton EDWARD

## 2017-06-27 NOTE — Addendum Note (Signed)
Addendum  created 06/27/17 1252 by Dejuan Elman, MD   Sign clinical note    

## 2017-07-29 ENCOUNTER — Inpatient Hospital Stay (HOSPITAL_COMMUNITY)
Admission: EM | Admit: 2017-07-29 | Discharge: 2017-08-05 | DRG: 974 | Disposition: A | Discharge status: Home / Self Care | Payer: Medicaid Other | Attending: Internal Medicine | Admitting: Internal Medicine

## 2017-07-29 ENCOUNTER — Encounter (HOSPITAL_COMMUNITY): Payer: Self-pay | Admitting: Emergency Medicine

## 2017-07-29 ENCOUNTER — Observation Stay (HOSPITAL_COMMUNITY): Payer: Medicaid Other

## 2017-07-29 DIAGNOSIS — R109 Unspecified abdominal pain: Secondary | ICD-10-CM | POA: Diagnosis present

## 2017-07-29 DIAGNOSIS — B3781 Candidal esophagitis: Secondary | ICD-10-CM | POA: Diagnosis not present

## 2017-07-29 DIAGNOSIS — Z8249 Family history of ischemic heart disease and other diseases of the circulatory system: Secondary | ICD-10-CM

## 2017-07-29 DIAGNOSIS — D703 Neutropenia due to infection: Secondary | ICD-10-CM | POA: Diagnosis present

## 2017-07-29 DIAGNOSIS — K828 Other specified diseases of gallbladder: Secondary | ICD-10-CM | POA: Diagnosis present

## 2017-07-29 DIAGNOSIS — R5081 Fever presenting with conditions classified elsewhere: Secondary | ICD-10-CM | POA: Diagnosis present

## 2017-07-29 DIAGNOSIS — E876 Hypokalemia: Secondary | ICD-10-CM | POA: Diagnosis present

## 2017-07-29 DIAGNOSIS — F419 Anxiety disorder, unspecified: Secondary | ICD-10-CM | POA: Diagnosis present

## 2017-07-29 DIAGNOSIS — G8929 Other chronic pain: Secondary | ICD-10-CM | POA: Diagnosis present

## 2017-07-29 DIAGNOSIS — D61818 Other pancytopenia: Secondary | ICD-10-CM | POA: Diagnosis present

## 2017-07-29 DIAGNOSIS — Z9114 Patient's other noncompliance with medication regimen: Secondary | ICD-10-CM | POA: Diagnosis not present

## 2017-07-29 DIAGNOSIS — K210 Gastro-esophageal reflux disease with esophagitis: Secondary | ICD-10-CM | POA: Diagnosis present

## 2017-07-29 DIAGNOSIS — B20 Human immunodeficiency virus [HIV] disease: Secondary | ICD-10-CM | POA: Diagnosis not present

## 2017-07-29 DIAGNOSIS — D509 Iron deficiency anemia, unspecified: Secondary | ICD-10-CM | POA: Diagnosis present

## 2017-07-29 DIAGNOSIS — N179 Acute kidney failure, unspecified: Secondary | ICD-10-CM | POA: Diagnosis present

## 2017-07-29 DIAGNOSIS — R7881 Bacteremia: Secondary | ICD-10-CM

## 2017-07-29 DIAGNOSIS — K5909 Other constipation: Secondary | ICD-10-CM | POA: Diagnosis present

## 2017-07-29 DIAGNOSIS — Z681 Body mass index (BMI) 19 or less, adult: Secondary | ICD-10-CM

## 2017-07-29 DIAGNOSIS — A419 Sepsis, unspecified organism: Principal | ICD-10-CM | POA: Diagnosis present

## 2017-07-29 DIAGNOSIS — K219 Gastro-esophageal reflux disease without esophagitis: Secondary | ICD-10-CM | POA: Diagnosis present

## 2017-07-29 DIAGNOSIS — R1310 Dysphagia, unspecified: Secondary | ICD-10-CM

## 2017-07-29 DIAGNOSIS — B9689 Other specified bacterial agents as the cause of diseases classified elsewhere: Secondary | ICD-10-CM

## 2017-07-29 DIAGNOSIS — D709 Neutropenia, unspecified: Secondary | ICD-10-CM

## 2017-07-29 DIAGNOSIS — D72819 Decreased white blood cell count, unspecified: Secondary | ICD-10-CM

## 2017-07-29 DIAGNOSIS — K819 Cholecystitis, unspecified: Secondary | ICD-10-CM

## 2017-07-29 DIAGNOSIS — Z9119 Patient's noncompliance with other medical treatment and regimen: Secondary | ICD-10-CM

## 2017-07-29 DIAGNOSIS — F319 Bipolar disorder, unspecified: Secondary | ICD-10-CM | POA: Diagnosis present

## 2017-07-29 DIAGNOSIS — K222 Esophageal obstruction: Secondary | ICD-10-CM | POA: Diagnosis present

## 2017-07-29 DIAGNOSIS — D696 Thrombocytopenia, unspecified: Secondary | ICD-10-CM

## 2017-07-29 DIAGNOSIS — E43 Unspecified severe protein-calorie malnutrition: Secondary | ICD-10-CM | POA: Diagnosis present

## 2017-07-29 DIAGNOSIS — R0902 Hypoxemia: Secondary | ICD-10-CM

## 2017-07-29 DIAGNOSIS — J45909 Unspecified asthma, uncomplicated: Secondary | ICD-10-CM | POA: Diagnosis present

## 2017-07-29 DIAGNOSIS — E860 Dehydration: Secondary | ICD-10-CM | POA: Diagnosis present

## 2017-07-29 DIAGNOSIS — R52 Pain, unspecified: Secondary | ICD-10-CM

## 2017-07-29 DIAGNOSIS — Z833 Family history of diabetes mellitus: Secondary | ICD-10-CM

## 2017-07-29 DIAGNOSIS — R112 Nausea with vomiting, unspecified: Secondary | ICD-10-CM

## 2017-07-29 DIAGNOSIS — E872 Acidosis: Secondary | ICD-10-CM | POA: Diagnosis present

## 2017-07-29 LAB — CBC WITH DIFFERENTIAL/PLATELET
Basophils Absolute: 0 10*3/uL (ref 0.0–0.1)
Basophils Relative: 0 %
EOS PCT: 2 %
Eosinophils Absolute: 0 10*3/uL (ref 0.0–0.7)
HEMATOCRIT: 18.5 % — AB (ref 36.0–46.0)
HEMOGLOBIN: 6 g/dL — AB (ref 12.0–15.0)
LYMPHS ABS: 0.1 10*3/uL — AB (ref 0.7–4.0)
Lymphocytes Relative: 9 %
MCH: 20.3 pg — AB (ref 26.0–34.0)
MCHC: 32.4 g/dL (ref 30.0–36.0)
MCV: 62.5 fL — ABNORMAL LOW (ref 78.0–100.0)
MONOS PCT: 18 %
Monocytes Absolute: 0.2 10*3/uL (ref 0.1–1.0)
NEUTROS ABS: 0.9 10*3/uL — AB (ref 1.7–7.7)
Neutrophils Relative %: 71 %
Platelets: 205 10*3/uL (ref 150–400)
RBC: 2.96 MIL/uL — ABNORMAL LOW (ref 3.87–5.11)
RDW: 18.2 % — ABNORMAL HIGH (ref 11.5–15.5)
WBC: 1.2 10*3/uL — CL (ref 4.0–10.5)

## 2017-07-29 LAB — COMPREHENSIVE METABOLIC PANEL
ALT: 52 U/L (ref 14–54)
AST: 120 U/L — AB (ref 15–41)
Albumin: 3.1 g/dL — ABNORMAL LOW (ref 3.5–5.0)
Alkaline Phosphatase: 203 U/L — ABNORMAL HIGH (ref 38–126)
Anion gap: 15 (ref 5–15)
BUN: 30 mg/dL — ABNORMAL HIGH (ref 6–20)
CHLORIDE: 100 mmol/L — AB (ref 101–111)
CO2: 22 mmol/L (ref 22–32)
CREATININE: 1.62 mg/dL — AB (ref 0.44–1.00)
Calcium: 8.8 mg/dL — ABNORMAL LOW (ref 8.9–10.3)
GFR calc Af Amer: 45 mL/min — ABNORMAL LOW (ref >60)
GFR calc non Af Amer: 39 mL/min — ABNORMAL LOW (ref >60)
GLUCOSE: 138 mg/dL — AB (ref 65–99)
Potassium: 3 mmol/L — ABNORMAL LOW (ref 3.5–5.1)
Sodium: 137 mmol/L (ref 135–145)
Total Bilirubin: 1.2 mg/dL (ref 0.3–1.2)
Total Protein: 6.9 g/dL (ref 6.5–8.1)

## 2017-07-29 LAB — URINALYSIS, ROUTINE W REFLEX MICROSCOPIC
Bilirubin Urine: NEGATIVE
Glucose, UA: NEGATIVE mg/dL
KETONES UR: 5 mg/dL — AB
Nitrite: NEGATIVE
PH: 6 (ref 5.0–8.0)
Protein, ur: 100 mg/dL — AB
SPECIFIC GRAVITY, URINE: 1.009 (ref 1.005–1.030)

## 2017-07-29 LAB — IRON AND TIBC
Iron: 20 ug/dL — ABNORMAL LOW (ref 28–170)
SATURATION RATIOS: 10 % — AB (ref 10.4–31.8)
TIBC: 196 ug/dL — AB (ref 250–450)
UIBC: 176 ug/dL

## 2017-07-29 LAB — RETICULOCYTES
RBC.: 2.45 MIL/uL — AB (ref 3.87–5.11)
Retic Ct Pct: <0.4 % — ABNORMAL LOW (ref 0.4–3.1)

## 2017-07-29 LAB — PATHOLOGIST SMEAR REVIEW

## 2017-07-29 LAB — FERRITIN: Ferritin: 1140 ng/mL — ABNORMAL HIGH (ref 11–307)

## 2017-07-29 LAB — LIPASE, BLOOD: Lipase: 30 U/L (ref 11–51)

## 2017-07-29 LAB — FOLATE: FOLATE: 5.2 ng/mL — AB (ref >5.9)

## 2017-07-29 LAB — VITAMIN B12: VITAMIN B 12: 2842 pg/mL — AB (ref 180–914)

## 2017-07-29 LAB — PREPARE RBC (CROSSMATCH)

## 2017-07-29 MED ORDER — DEXTROSE IN LACTATED RINGERS 5 % IV SOLN
INTRAVENOUS | Status: DC
  Administered 2017-07-29: 11:00:00 via INTRAVENOUS

## 2017-07-29 MED ORDER — BENZTROPINE MESYLATE 1 MG PO TABS
1.0000 mg | ORAL_TABLET | Freq: Every day | ORAL | Status: DC
  Administered 2017-07-29 – 2017-08-03 (×6): 1 mg via ORAL
  Filled 2017-07-29 (×7): qty 1

## 2017-07-29 MED ORDER — PRO-STAT SUGAR FREE PO LIQD
30.0000 mL | Freq: Two times a day (BID) | ORAL | Status: DC
  Administered 2017-07-29 – 2017-08-05 (×10): 30 mL via ORAL
  Filled 2017-07-29 (×12): qty 30

## 2017-07-29 MED ORDER — SENNOSIDES-DOCUSATE SODIUM 8.6-50 MG PO TABS
2.0000 | ORAL_TABLET | Freq: Two times a day (BID) | ORAL | Status: DC
  Administered 2017-07-29 – 2017-08-05 (×10): 2 via ORAL
  Filled 2017-07-29 (×13): qty 2

## 2017-07-29 MED ORDER — SODIUM CHLORIDE 0.9 % IV BOLUS (SEPSIS)
1000.0000 mL | Freq: Once | INTRAVENOUS | Status: AC
  Administered 2017-07-29: 1000 mL via INTRAVENOUS

## 2017-07-29 MED ORDER — DEXTROSE IN LACTATED RINGERS 5 % IV SOLN
INTRAVENOUS | Status: DC
  Filled 2017-07-29: qty 1000

## 2017-07-29 MED ORDER — MORPHINE SULFATE (PF) 2 MG/ML IV SOLN
1.0000 mg | Freq: Once | INTRAVENOUS | Status: AC
  Administered 2017-07-29: 1 mg via INTRAVENOUS
  Filled 2017-07-29: qty 1

## 2017-07-29 MED ORDER — FENTANYL CITRATE (PF) 100 MCG/2ML IJ SOLN
25.0000 ug | INTRAMUSCULAR | Status: DC | PRN
  Administered 2017-07-29: 50 ug via INTRAVENOUS
  Filled 2017-07-29: qty 2

## 2017-07-29 MED ORDER — POTASSIUM CHLORIDE 20 MEQ/15ML (10%) PO SOLN
40.0000 meq | Freq: Once | ORAL | Status: AC
  Administered 2017-07-29: 40 meq via ORAL
  Filled 2017-07-29 (×2): qty 30

## 2017-07-29 MED ORDER — ONDANSETRON HCL 4 MG/2ML IJ SOLN
4.0000 mg | Freq: Once | INTRAMUSCULAR | Status: AC
  Administered 2017-07-29: 4 mg via INTRAVENOUS
  Filled 2017-07-29: qty 2

## 2017-07-29 MED ORDER — BOOST / RESOURCE BREEZE PO LIQD
1.0000 | Freq: Three times a day (TID) | ORAL | Status: DC
  Administered 2017-07-29 – 2017-08-02 (×8): 1 via ORAL

## 2017-07-29 MED ORDER — SODIUM CHLORIDE 0.9 % IV SOLN
10.0000 mL/h | Freq: Once | INTRAVENOUS | Status: AC
  Administered 2017-07-29: 10 mL/h via INTRAVENOUS

## 2017-07-29 MED ORDER — ALBUTEROL SULFATE (2.5 MG/3ML) 0.083% IN NEBU
3.0000 mL | INHALATION_SOLUTION | RESPIRATORY_TRACT | Status: DC | PRN
  Administered 2017-07-31: 3 mL via RESPIRATORY_TRACT
  Filled 2017-07-29: qty 3

## 2017-07-29 MED ORDER — ELVITEG-COBIC-EMTRICIT-TENOFAF 150-150-200-10 MG PO TABS
1.0000 | ORAL_TABLET | Freq: Every day | ORAL | Status: DC
  Administered 2017-07-29 – 2017-08-05 (×8): 1 via ORAL
  Filled 2017-07-29 (×8): qty 1

## 2017-07-29 MED ORDER — PANTOPRAZOLE SODIUM 40 MG PO TBEC
40.0000 mg | DELAYED_RELEASE_TABLET | Freq: Every day | ORAL | Status: DC
  Administered 2017-07-29 – 2017-07-31 (×3): 40 mg via ORAL
  Filled 2017-07-29 (×3): qty 1

## 2017-07-29 MED ORDER — SUCRALFATE 1 G PO TABS
1.0000 g | ORAL_TABLET | Freq: Once | ORAL | Status: AC
  Administered 2017-07-29: 1 g via ORAL
  Filled 2017-07-29: qty 1

## 2017-07-29 MED ORDER — POTASSIUM CHLORIDE 10 MEQ/100ML IV SOLN
10.0000 meq | Freq: Once | INTRAVENOUS | Status: AC
  Administered 2017-07-29: 10 meq via INTRAVENOUS
  Filled 2017-07-29: qty 100

## 2017-07-29 MED ORDER — ONDANSETRON HCL 4 MG/2ML IJ SOLN
4.0000 mg | Freq: Four times a day (QID) | INTRAMUSCULAR | Status: DC | PRN
  Administered 2017-07-29: 4 mg via INTRAVENOUS
  Filled 2017-07-29: qty 2

## 2017-07-29 MED ORDER — GI COCKTAIL ~~LOC~~
30.0000 mL | Freq: Once | ORAL | Status: AC
  Administered 2017-07-29: 30 mL via ORAL
  Filled 2017-07-29: qty 30

## 2017-07-29 MED ORDER — OLANZAPINE 5 MG PO TABS
5.0000 mg | ORAL_TABLET | Freq: Every day | ORAL | Status: DC
Start: 2017-07-29 — End: 2017-08-05
  Administered 2017-07-29 – 2017-08-04 (×7): 5 mg via ORAL
  Filled 2017-07-29 (×8): qty 1

## 2017-07-29 MED ORDER — SODIUM CHLORIDE 0.9 % IV SOLN
INTRAVENOUS | Status: DC
  Administered 2017-07-29 (×2): via INTRAVENOUS

## 2017-07-29 MED ORDER — SUCRALFATE 1 G PO TABS
1.0000 g | ORAL_TABLET | Freq: Three times a day (TID) | ORAL | Status: DC
  Administered 2017-07-29 – 2017-07-31 (×7): 1 g via ORAL
  Filled 2017-07-29 (×9): qty 1

## 2017-07-29 MED ORDER — MORPHINE SULFATE (PF) 2 MG/ML IV SOLN
4.0000 mg | Freq: Once | INTRAVENOUS | Status: AC
  Administered 2017-07-29: 4 mg via INTRAVENOUS
  Filled 2017-07-29: qty 2

## 2017-07-29 MED ORDER — FLUCONAZOLE IN SODIUM CHLORIDE 100-0.9 MG/50ML-% IV SOLN
100.0000 mg | INTRAVENOUS | Status: DC
  Administered 2017-07-29: 100 mg via INTRAVENOUS
  Filled 2017-07-29 (×3): qty 50

## 2017-07-29 MED ORDER — OXYCODONE HCL 5 MG PO TABS
5.0000 mg | ORAL_TABLET | Freq: Four times a day (QID) | ORAL | Status: DC | PRN
  Administered 2017-07-29 – 2017-07-31 (×4): 5 mg via ORAL
  Filled 2017-07-29 (×5): qty 1

## 2017-07-29 NOTE — H&P (Signed)
History and Physical    [name]:[id number] DOB: 01-15-1976 DOA: 07/29/2017  I have briefly reviewed the patient's prior medical records in [place]  PCP: Nolene Ebbs, MD  Patient coming from: Home  Chief Complaint: Hervey Ard RUQ abdominal pain that radiates around to the back X2 months  HPI: [name] is a 41 y.o. 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 She reports the pain started 04/30/2017 after her most recent EGD and esophageal dilation (Dr. Michail Sermon - gastroenterologist)  for which she did not complete her prescribed fluconazole 200mg  PO daily. Since that time, she describes increasing dysphagia and RUQ that is made worse with eating, cold liquids, and movement but better with a heating pad and meloxicam. She reports significantly reduced oral intake and corresponding 50 lb weight loss, daily "green" emesis x2 weeks, and constipation or "small pebble" stools despite stool softener. She reports mild dysuria and hematuria x2 days after the end of menses. She also reportedly sometimes feels short of breath but has not been taking her home albuterol for her asthma. She denies chest pain, headache, f/c, diaphoresis, and diarrhea. She states she has never had an episode this bad in the past but feels her pain may be worse this time d/t stress as a single and unemployed mother of two teenage boys, aged 91 and 6. She reports that she is not taking any of her prescribed medications d/t her nausea and emesis. She denies any history of drug or tobacco use and denies alcohol use within the last year.    ED Course:  In the ED, vitals were significant for temp 96.23F but otherwise unremarkable with HR 691, RR15, BP 101/64, SpO2 100% ORA. Labs significant for Hgb 6.0, HCT 18.5, WBC 1.2, potassium 3.0, Cr 1.62, and BUN 30, AST 120, alkaline phosphatase 203, and   albumin 3.1. Patient received IVF, GI cocktail, IV KCl, and ondansetron with modest symptom improvement. Transfusion ordered by EDP.  Review of Systems: As per HPI otherwise 10 point review of systems negative.   Past Medical History:  Diagnosis Date  . Acute psychosis 03/10/12   2nd admission in last wk for this  . Angio-edema   . Anxiety   . Asthma    inhaler 2xday  . Bipolar disorder ([place])   . Depression   . GERD (gastroesophageal reflux disease)   . Hepatitis B    /E-chart  . History of blood transfusion   . HIV positive ([place])   . Microcytic anemia    h/o per E-chart  . Noncompliance with medication regimen    /e-chart  . 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 Pyelonephritis    h/o per E-chart  . Shortness of breath dyspnea    due to Asthma  . Thyroid disease   . Urticaria     Past Surgical History:  Procedure Laterality Date  . BALLOON DILATION N/A 02/14/2015   Procedure: BALLOON DILATION;  Surgeon: Lear Ng, MD;  Location: [facility] ENDOSCOPY;  Service: Endoscopy;  Laterality: N/A;  . BALLOON DILATION N/A 10/25/2016   Procedure: BALLOON DILATION;  Surgeon: Wilford Corner, MD;  Location: [facility] ENDOSCOPY;  Service: Endoscopy;  Laterality: N/A;  . BALLOON DILATION N/A 01/14/2017   Procedure: BALLOON DILATION;  Surgeon: Wilford Corner, MD;  Location: [facility] ENDOSCOPY;  Service: Endoscopy;  Laterality: N/A;  . BALLOON DILATION N/A 04/30/2017   Procedure: BALLOON DILATION;  Surgeon: Wilford Corner, MD;  Location: [facility] ENDOSCOPY;  Service: Endoscopy;  Laterality: N/A;  . ESOPHAGOGASTRODUODENOSCOPY N/A 07/13/2013   Procedure: ESOPHAGOGASTRODUODENOSCOPY (EGD);  Surgeon: Beryle Beams,  MD;  Location: [facility] ENDOSCOPY;  Service: Endoscopy;  Laterality: N/A;  . ESOPHAGOGASTRODUODENOSCOPY N/A 08/12/2014   Procedure: ESOPHAGOGASTRODUODENOSCOPY (EGD);  Surgeon: Milus Banister, MD;  Location: [facility] ENDOSCOPY;  Service: Endoscopy;  Laterality: N/A;  . ESOPHAGOGASTRODUODENOSCOPY N/A 02/14/2015   Procedure: ESOPHAGOGASTRODUODENOSCOPY (EGD);  Surgeon: Lear Ng, MD;  Location: [facility] ENDOSCOPY;  Service: Endoscopy;  Laterality: N/A;  . ESOPHAGOGASTRODUODENOSCOPY N/A 10/25/2016   Procedure: ESOPHAGOGASTRODUODENOSCOPY (EGD);  Surgeon: Wilford Corner, MD;  Location: [facility] ENDOSCOPY;  Service: Endoscopy;  Laterality: N/A;  . ESOPHAGOGASTRODUODENOSCOPY (EGD) WITH PROPOFOL N/A 04/11/2015   Procedure: ESOPHAGOGASTRODUODENOSCOPY (EGD) WITH PROPOFOL;  Surgeon: Wilford Corner, MD;  Location: [facility] ENDOSCOPY;  Service: Endoscopy;  Laterality: N/A;  . ESOPHAGOGASTRODUODENOSCOPY (EGD) WITH PROPOFOL N/A 09/11/2015   Procedure: ESOPHAGOGASTRODUODENOSCOPY (EGD) WITH PROPOFOL;  Surgeon: Wilford Corner, MD;  Location: [facility] ENDOSCOPY;  Service: Endoscopy;  Laterality: N/A;  . ESOPHAGOGASTRODUODENOSCOPY (EGD) WITH PROPOFOL N/A 01/14/2017   Procedure: ESOPHAGOGASTRODUODENOSCOPY (EGD) WITH PROPOFOL;  Surgeon: Wilford Corner, MD;  Location: [facility] ENDOSCOPY;  Service: Endoscopy;  Laterality: N/A;  . ESOPHAGOGASTRODUODENOSCOPY (EGD) WITH PROPOFOL N/A 04/30/2017   Procedure: ESOPHAGOGASTRODUODENOSCOPY (EGD) WITH PROPOFOL;  Surgeon: Wilford Corner, MD;  Location: [place];  Service: Endoscopy;  Laterality: N/A;  . SAVORY DILATION N/A 02/14/2015   Procedure: SAVORY DILATION;  Surgeon: Lear Ng, MD;  Location: [place];  Service: Endoscopy;  Laterality: N/A;  no xray needed  . SAVORY DILATION N/A 04/11/2015   Procedure: SAVORY DILATION;  Surgeon: Wilford Corner, MD;  Location: [facility] ENDOSCOPY;  Service: Endoscopy;  Laterality: N/A;  . SAVORY DILATION N/A 09/11/2015   Procedure: SAVORY DILATION;  Surgeon:  Wilford Corner, MD;  Location: [facility] ENDOSCOPY;  Service: Endoscopy;  Laterality: N/A;  . SAVORY DILATION N/A 04/30/2017   Procedure: SAVORY DILATION;  Surgeon: Wilford Corner, MD;  Location: [place];  Service: Endoscopy;  Laterality: N/A;  . THYROID LOBECTOMY  08/2002   left & isthmectomy; for benign thyroid adenoma/E-chart  . THYROID SURGERY    . TUBAL LIGATION  07/2002   /E-chart     reports that she has never smoked. She has never used smokeless tobacco. She reports that she does not drink alcohol or use drugs.  Allergies  Allergen Reactions  . Dapsone Itching and Rash  . Other Hives and Itching    Berries, TREE NUTS  . Peanut-Containing Drug Products Hives  . Shellfish Allergy Hives  . Bactrim Itching  . Orange Fruit Itching  . Sulfa Antibiotics Itching    Family History  Problem Relation Age of Onset  . Cancer Mother   . Diabetes Mother   . Diabetes Father   . Heart disease Father   . Diabetes Sister   . Urticaria Sister   . Asthma Son   . Allergic rhinitis Neg Hx   . Eczema Neg Hx   . Immunodeficiency Neg Hx     Prior to Admission medications   Medication Sig Start Date End Date Taking? Authorizing Provider  albuterol (PROAIR HFA) 108 (90 BASE) MCG/ACT inhaler Inhale 2 puffs into the lungs every 4 (four) hours as  needed for wheezing or shortness of breath (cough). 11/16/15  Yes Gean Quint, MD  benztropine (COGENTIN) 1 MG tablet Take 1 tablet (1 mg total) by mouth daily. 11/12/16  Yes Michel Bickers, MD  elvitegravir-cobicistat-emtricitabine-tenofovir (GENVOYA) 150-150-200-10 MG TABS tablet Take 1 tablet by mouth daily with breakfast. 11/12/16  Yes Michel Bickers, MD  fluconazole (DIFLUCAN) 100 MG tablet Take 1 tablet (100 mg total) by mouth once a week. 11/12/16  Yes Michel Bickers, MD  OLANZapine (ZYPREXA) 5 MG tablet Take 1 tablet (5 mg total) by mouth at bedtime. 11/12/16  Yes Michel Bickers, MD  pantoprazole (PROTONIX) 40 MG tablet Take 1 tablet (40 mg  total) by mouth daily. 11/12/16  Yes Michel Bickers, MD  sucralfate ([name]) 1 g tablet Take 1 tablet (1 g total) by mouth 4 (four) times daily -  with meals and at bedtime. 04/03/17 07/29/17 Yes Cardama, Grayce Sessions, MD  EPINEPHrine 0.3 mg/0.3 mL IJ SOAJ injection Inject 0.3 mLs (0.3 mg total) into the muscle once. Patient not taking: Reported on 11/12/2016 12/20/15   Gean Quint, MD    Physical Exam: Vitals:   07/29/17 0451 07/29/17 0723 07/29/17 0800 07/29/17 0852  BP: 119/80 108/75  101/64  Pulse: 95 65  61  Resp: 20 18 16 15   Temp: (!) 97.3 F (36.3 C)   (!) 96.4 F (35.8 C)  TempSrc: Oral   Oral  SpO2: 94% 100%  100%      Constitutional: Cachectic female, intermittently tearful and in a noticeable amount of pain Eyes: PERRL, lids and conjunctivae normal ENMT: Mucous membranes are dry. Faint areas of small white patches observed directly behind upper teeth. Neck: normal, supple Respiratory: clear to auscultation bilaterally, no wheezing, no crackles. Normal respiratory effort. No accessory muscle use.  Cardiovascular: Regular rate and rhythm, no murmurs / rubs / gallops. No extremity edema. 2+ pedal pulses.  Abdomen: TTP in LLQ but otherwise not tender, (-) Murphy's sign, (-) McBurney's point. no masses palpated. Bowel sounds positive x4.  Musculoskeletal: no clubbing / cyanosis. Normal muscle tone.  Skin: Dryness of facial skin but otherwise no rashes, lesions, ulcers. No induration. Skin is warm and dry. Neurologic: Strength 5/5 in all 4.  Psychiatric: Normal judgment and insight. Alert and oriented x 3. Sad mood.  Labs on Admission: I have personally reviewed following labs and imaging studies  CBC:  Recent Labs Lab 07/29/17 0520  WBC 1.2*  NEUTROABS 0.9*  HGB 6.0*  HCT 18.5*  MCV 62.5*  PLT 858   Basic Metabolic Panel:  Recent Labs Lab 07/29/17 0520  NA 137  K 3.0*  CL 100*  CO2 22  GLUCOSE 138*  BUN 30*  CREATININE 1.62*  CALCIUM 8.8*    GFR: CrCl cannot be calculated (Unknown ideal weight.). Liver Function Tests:  Recent Labs Lab 07/29/17 0520  AST 120*  ALT 52  ALKPHOS 203*  BILITOT 1.2  PROT 6.9  ALBUMIN 3.1*    Recent Labs Lab 07/29/17 0520  LIPASE 30   No results for input(s): AMMONIA in the last 168 hours. Coagulation Profile: No results for input(s): INR, PROTIME in the last 168 hours. Cardiac Enzymes: No results for input(s): CKTOTAL, CKMB, CKMBINDEX, TROPONINI in the last 168 hours. BNP (last 3 results) No results for input(s): PROBNP in the last 8760 hours. HbA1C: No results for input(s): HGBA1C in the last 72 hours. CBG: No results for input(s): GLUCAP in the last 168 hours. Lipid Profile: No results for input(s): CHOL, HDL, LDLCALC,  TRIG, CHOLHDL, LDLDIRECT in the last 72 hours. Thyroid Function Tests: No results for input(s): TSH, T4TOTAL, FREET4, T3FREE, THYROIDAB in the last 72 hours. Anemia Panel: No results for input(s): VITAMINB12, FOLATE, FERRITIN, TIBC, IRON, RETICCTPCT in the last 72 hours. Urine analysis:    Component Value Date/Time   COLORURINE AMBER (A) 10/23/2016 1741   APPEARANCEUR CLOUDY (A) 10/23/2016 1741   LABSPEC 1.021 10/23/2016 1741   PHURINE 6.0 10/23/2016 1741   GLUCOSEU NEGATIVE 10/23/2016 1741   HGBUR SMALL (A) 10/23/2016 1741   BILIRUBINUR MODERATE (A) 10/23/2016 1741   KETONESUR >80 (A) 10/23/2016 1741   PROTEINUR 100 (A) 10/23/2016 1741   UROBILINOGEN 0.2 12/07/2014 1441   NITRITE NEGATIVE 10/23/2016 1741   LEUKOCYTESUR NEGATIVE 10/23/2016 1741     Radiological Exams on Admission: No results found.  EKG: No EKG obtained in the ED  Assessment/Plan Active Problems:   Human immunodeficiency virus (HIV) disease (HCC)   Microcytic anemia   Bipolar disorder (HCC)   Dysphagia   Asthma   GERD (gastroesophageal reflux disease)   Candidal esophagitis (HCC)   Abdominal pain   Acute renal failure (ARF) (HCC)   Leukopenia   RUQ pain with  associated emesis and reduced oral intake. -History of GERD with esophagitis. Followed by gastroenterologist, Dr. Michail Sermon.  -RUQ ultrasound and CXR without acute findings. -Hepatobiliary scan with EF - pending. -Continue IVF for dehydration associated with emesis and reduced oral intake. Consult nutrition for low albumin.  -Continue telemetry, monitor vitals.  Human immunodeficiency virus (HIV) disease with leukopenia & history of medication non-compliance -Not taking home med (genvoya) x3 wks. WBC 1.2. -Check HIV RNA quant and CD4 count -Continue home genvoya.  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 Michail Sermon). Not taking diflucan prescribed for after the procedure. -Continue IV diflucan.   GERD  -Not taking home protonix zyprexa, carafate "due to n/v and pain." Rule out GI bleed due to low Hgb (6.0). -Continue home medications. GI cocktail, morphine as needed.  Hypokalemia -Likely due to emesis x2 weeks. -Replete potassium with goal 4.0. -Check magnesium. Replete if needed to 2.0.  Acute Renal Failure / AKI. -Patient reports current hematuria and history of pyelonephritis. -Check UA  Microcytic anemia  -Chronic. Baseline Hgb ~8.7. Current Hgb 6.0. Rule out GI bleed -Iron supplement as needed. Transfuse if needed.  -Monitor. -Check FOBT  Asthma with associated SOB  -Not taking home albuterol. At times OSat drops to 70s-80s. -Continue home albuterol. Breathing treatments as needed. Oxygen as needed.  Bipolar disorder and anxiety -No current home medications.  Medication non-compliance -As noted above, history of not taking prescribed medications for chronic conditions.    DVT prophylaxis: SCDs (no lovenox until r/o GI bleed) Code Status: Full Family Communication: None Disposition Plan: Home once stable Consults called:  None   Marrianne Mood, Student-PA 07/29/2017, 9:57 AM   Attending MD note  Patient was seen, examined,treatment plan was discussed with the PA-S.  I have personally reviewed the clinical findings, lab, imaging studies and management of this patient in detail. I agree with the documentation, as recorded by the PA-S  Patient is a 41 year old female with history of HIV and noncompliant with her medications, esophageal candidiasis complicated by recurrent strictures status post several endoscopies, most recent one in May 2018 with dilatation, acute on chronic right upper quadrant pain for at least the past 4 months with prior negative workup,  weight loss of about at least 50 pounds (documented 180 pounds in November 2017 and now she is at 71), presents to the emergency room with exacerbation of her right upper quadrant abdominal pain.  She is also saying that she has had poor p.o. intake due to her pain, and daily nausea and vomiting.  She is also been complaining of blood in the urine, she is not sure about when this started however has noticed that it has been persistent even after she finished her period 2 days ago.  Workup in the emergency room revealed a decreased hemoglobin to 6, leukopenia with a white count of 1.2, and acute kidney injury.  We were asked to admit for further workup of her chronic pain, acute kidney injury as well as anemia.  On Exam: Gen. exam: Awake, alert, not in any distress, cachectic African-American female Chest: Good air entry bilaterally, no rhonchi or rales CVS: S1-S2 regular, no murmurs Abdomen: Soft, nontender and nondistended Neurology: Non-focal Skin: No rash or lesions  Plan   Acute on chronic right upper quadrant pain with vomiting in the setting of poor p.o. intake and weight loss -Gastroenterology consulted, recommending HIDA scan.  Right upper quadrant ultrasound negative for acute findings  HIV in the setting of noncompliance with home  medication -Resume her home regimen, update the CD4 and viral load  Probable esophageal candidiasis -Due to noncompliance, suspect she has ongoing issues with her esophageal candidiasis -IV Diflucan for now, I doubt she will need an EGD but will defer this to gastroenterology -Allow clear liquids  Acute kidney injury -Likely in the setting of dehydration, give IV fluids and repeat renal function in the morning  Hematuria -?  Related to urinary tract infection, urinalysis is pending  Microcytic anemia -MCV is in the low 60s, likely has iron deficiency, check anemia panel, transfuse 2 units of packed red blood cells.  Check fecal occult.  History of asthma -No wheezing, continue home medications -Chest x-ray without acute findings  Hypokalemia due to poor p.o. intake -Replete and recheck in the morning   Rest as above   Admission status: Observation  At the point of initial evaluation, it is my clinical opinion that admission for OBSERVATION is reasonable and necessary because the patient's presenting complaints in the context of their chronic conditions represent sufficient risk of deterioration or significant morbidity to constitute reasonable grounds for close observation in the hospital setting, but that the patient may be medically stable for discharge from the hospital within 24 to 48 hours.      Ethan Kasperski M. Cruzita Lederer, MD [facility] [phone]  If 7PM-7AM, please contact night-coverage [email or web address] Password [email or web address]

## 2017-07-29 NOTE — ED Provider Notes (Signed)
[facility] DEPT Provider Note   CSN: [id number] Arrival date & time: 07/29/17  0429     History   Chief Complaint Chief Complaint  Patient presents with  . Abdominal Pain    right upper     HPI [name] is a 41 y.o. female.  The history is provided by the patient.  She complains of right upper quadrant pain which does radiate to the back. This is been present for 2 months, but has gotten worse. She rates pain at 10/10. There is associated nausea and vomiting. She denies fever, chills, sweats. Nothing makes the pain better, nothing makes it worse. She has had similar episodes in the past. She is HIV positive, and also under the care of a gastroenterologist.  Past Medical History:  Diagnosis Date  . Acute psychosis 03/10/12   2nd admission in last wk for this  . Angio-edema   . Anxiety   . Asthma    inhaler 2xday  . Bipolar disorder ([place])   . Depression   . GERD (gastroesophageal reflux disease)   . Hepatitis B    /E-chart  . History of blood transfusion   . HIV positive ([place])   . Microcytic anemia    h/o per E-chart  . Noncompliance with medication regimen    /e-chart  . 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 Pyelonephritis    h/o per E-chart  . Shortness of breath dyspnea    due to Asthma  . Thyroid disease   . Urticaria     Patient Active Problem List   Diagnosis Date Noted  . Protein-calorie malnutrition, severe 10/26/2016  . Status post thyroidectomy 10/25/2016  . Asthma 10/25/2016  . 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 GERD (gastroesophageal reflux disease) 10/25/2016  . 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 Dysphagia 09/11/2015  . Esophageal stricture 12/06/2014  . 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 Bipolar disorder ([place]) 04/01/2012  . Human immunodeficiency virus (HIV) disease ([place]) 02/28/2009  . Genital herpes 02/28/2009  . Microcytic anemia 02/27/2009    Past  Surgical History:  Procedure Laterality Date  . BALLOON DILATION N/A 02/14/2015   Procedure: BALLOON DILATION;  Surgeon: Lear Ng, MD;  Location: [facility] ENDOSCOPY;  Service: Endoscopy;  Laterality: N/A;  . BALLOON DILATION N/A 10/25/2016   Procedure: BALLOON DILATION;  Surgeon: Wilford Corner, MD;  Location: [facility] ENDOSCOPY;  Service: Endoscopy;  Laterality: N/A;  . BALLOON DILATION N/A 01/14/2017   Procedure: BALLOON DILATION;  Surgeon: Wilford Corner, MD;  Location: [facility] ENDOSCOPY;  Service: Endoscopy;  Laterality: N/A;  . BALLOON DILATION N/A 04/30/2017   Procedure: BALLOON DILATION;  Surgeon: Wilford Corner, MD;  Location: [facility] ENDOSCOPY;  Service: Endoscopy;  Laterality: N/A;  . ESOPHAGOGASTRODUODENOSCOPY N/A 07/13/2013   Procedure: ESOPHAGOGASTRODUODENOSCOPY (EGD);  Surgeon: Beryle Beams, MD;  Location: [facility] ENDOSCOPY;  Service: Endoscopy;  Laterality: N/A;  . ESOPHAGOGASTRODUODENOSCOPY N/A 08/12/2014   Procedure: ESOPHAGOGASTRODUODENOSCOPY (EGD);  Surgeon: Milus Banister, MD;  Location: [facility] ENDOSCOPY;  Service: Endoscopy;  Laterality: N/A;  . ESOPHAGOGASTRODUODENOSCOPY N/A 02/14/2015   Procedure: ESOPHAGOGASTRODUODENOSCOPY (EGD);  Surgeon: Lear Ng, MD;  Location: [facility] ENDOSCOPY;  Service: Endoscopy;  Laterality: N/A;  . ESOPHAGOGASTRODUODENOSCOPY N/A 10/25/2016   Procedure: ESOPHAGOGASTRODUODENOSCOPY (EGD);  Surgeon: Wilford Corner, MD;  Location: [facility] ENDOSCOPY;  Service: Endoscopy;  Laterality: N/A;  . ESOPHAGOGASTRODUODENOSCOPY (EGD) WITH PROPOFOL N/A 04/11/2015   Procedure: ESOPHAGOGASTRODUODENOSCOPY (EGD) WITH PROPOFOL;  Surgeon: Wilford Corner, MD;  Location: [facility] ENDOSCOPY;  Service:  Endoscopy;  Laterality: N/A;  . ESOPHAGOGASTRODUODENOSCOPY (EGD) WITH PROPOFOL N/A 09/11/2015   Procedure: ESOPHAGOGASTRODUODENOSCOPY (EGD) WITH PROPOFOL;  Surgeon: Wilford Corner, MD;  Location: [facility] ENDOSCOPY;  Service: Endoscopy;  Laterality: N/A;  . ESOPHAGOGASTRODUODENOSCOPY (EGD) WITH  PROPOFOL N/A 01/14/2017   Procedure: ESOPHAGOGASTRODUODENOSCOPY (EGD) WITH PROPOFOL;  Surgeon: Wilford Corner, MD;  Location: [facility] ENDOSCOPY;  Service: Endoscopy;  Laterality: N/A;  . ESOPHAGOGASTRODUODENOSCOPY (EGD) WITH PROPOFOL N/A 04/30/2017   Procedure: ESOPHAGOGASTRODUODENOSCOPY (EGD) WITH PROPOFOL;  Surgeon: Wilford Corner, MD;  Location: [place];  Service: Endoscopy;  Laterality: N/A;  . SAVORY DILATION N/A 02/14/2015   Procedure: SAVORY DILATION;  Surgeon: Lear Ng, MD;  Location: [place];  Service: Endoscopy;  Laterality: N/A;  no xray needed  . SAVORY DILATION N/A 04/11/2015   Procedure: SAVORY DILATION;  Surgeon: Wilford Corner, MD;  Location: [facility] ENDOSCOPY;  Service: Endoscopy;  Laterality: N/A;  . SAVORY DILATION N/A 09/11/2015   Procedure: SAVORY DILATION;  Surgeon: Wilford Corner, MD;  Location: [facility] ENDOSCOPY;  Service: Endoscopy;  Laterality: N/A;  . SAVORY DILATION N/A 04/30/2017   Procedure: SAVORY DILATION;  Surgeon: Wilford Corner, MD;  Location: [facility];  Service: Endoscopy;  Laterality: N/A;  . THYROID LOBECTOMY  08/2002   left & isthmectomy; for benign thyroid adenoma/E-chart  . THYROID SURGERY    . TUBAL LIGATION  07/2002   /E-chart    OB History    No data available       Home Medications    Prior to Admission medications   Medication Sig Start Date End Date Taking? Authorizing Provider  albuterol (PROAIR HFA) 108 (90 BASE) MCG/ACT inhaler Inhale 2 puffs into the lungs every 4 (four) hours as needed for wheezing or shortness of breath (cough). 11/16/15   Gean Quint, MD  benztropine (COGENTIN) 1 MG tablet Take 1 tablet (1 mg total) by mouth daily. 11/12/16   Michel Bickers, MD  elvitegravir-cobicistat-emtricitabine-tenofovir (GENVOYA) 150-150-200-10 MG TABS tablet Take 1 tablet by mouth daily with breakfast. 11/12/16   Michel Bickers, MD  EPINEPHrine 0.3 mg/0.3 mL IJ SOAJ injection Inject 0.3 mLs (0.3 mg total) into the muscle  once. Patient not taking: Reported on 11/12/2016 12/20/15   Gean Quint, MD  fluconazole (DIFLUCAN) 100 MG tablet Take 1 tablet (100 mg total) by mouth once a week. 11/12/16   Michel Bickers, MD  OLANZapine (ZYPREXA) 5 MG tablet Take 1 tablet (5 mg total) by mouth at bedtime. 11/12/16   Michel Bickers, MD  pantoprazole (PROTONIX) 40 MG tablet Take 1 tablet (40 mg total) by mouth daily. 11/12/16   Michel Bickers, MD  sucralfate ([name]) 1 g tablet Take 1 tablet (1 g total) by mouth 4 (four) times daily -  with meals and at bedtime. 04/03/17 04/13/17  Fatima Blank, MD    Family History Family History  Problem Relation Age of Onset  . Cancer Mother   . Diabetes Mother   . Diabetes Father   . Heart disease Father   . Diabetes Sister   . Urticaria Sister   . Asthma Son   . Allergic rhinitis Neg Hx   . Eczema Neg Hx   . Immunodeficiency Neg Hx     Social History Social History  Substance Use Topics  . Smoking status: Never Smoker  . Smokeless tobacco: Never Used  . Alcohol use No     Allergies   Dapsone; Other; Peanut-containing drug products; Shellfish allergy; Bactrim; Orange fruit; and Sulfa antibiotics   Review of Systems Review  of Systems  All other systems reviewed and are negative.    Physical Exam Updated Vital Signs BP 119/80 (BP Location: Left Arm)   Pulse 95   Temp (!) 97.3 F (36.3 C) (Oral)   Resp 20   SpO2 94%   Physical Exam  Nursing note and vitals reviewed.  Cachectic 41 year old female, moaning in pain, but in no acute distress. Vital signs are normal. Oxygen saturation is 94%, which is normal. Head is normocephalic and atraumatic. PERRLA, EOMI. Oropharynx is clear. Neck is nontender and supple without adenopathy or JVD. Back is nontender and there is no CVA tenderness. Lungs are clear without rales, wheezes, or rhonchi. Chest is nontender. Heart has regular rate and rhythm without murmur. Abdomen is soft, flat, with moderate upper  abdominal tenderness. There is no rebound or guarding. There are no masses or hepatosplenomegaly and peristalsis is hypoactive. Extremities have no cyanosis or edema, full range of motion is present. Skin is warm and dry without rash. Neurologic: Mental status is normal, cranial nerves are intact, there are no motor or sensory deficits.  ED Treatments / Results  Labs (all labs ordered are listed, but only abnormal results are displayed) Labs Reviewed  COMPREHENSIVE METABOLIC PANEL - Abnormal; Notable for the following:       Result Value   Potassium 3.0 (*)    Chloride 100 (*)    Glucose, Bld 138 (*)    BUN 30 (*)    Creatinine, Ser 1.62 (*)    Calcium 8.8 (*)    Albumin 3.1 (*)    AST 120 (*)    Alkaline Phosphatase 203 (*)    GFR calc non Af Amer 39 (*)    GFR calc Af Amer 45 (*)    All other components within normal limits  CBC WITH DIFFERENTIAL/PLATELET - Abnormal; Notable for the following:    WBC 1.2 (*)    RBC 2.96 (*)    Hemoglobin 6.0 (*)    HCT 18.5 (*)    MCV 62.5 (*)    MCH 20.3 (*)    RDW 18.2 (*)    Neutro Abs 0.9 (*)    Lymphs Abs 0.1 (*)    All other components within normal limits  LIPASE, BLOOD  T-HELPER CELLS (CD4) COUNT (NOT AT [facility])  HIV 1 RNA QUANT-NO REFLEX-BLD  TYPE AND SCREEN  PREPARE RBC (CROSSMATCH)    Procedures Procedures (including critical care time) CRITICAL CARE Performed by: [email or web address] Total critical care time: 55 minutes Critical care time was exclusive of separately billable procedures and treating other patients. Critical care was necessary to treat or prevent imminent or life-threatening deterioration. Critical care was time spent personally by me on the following activities: development of treatment plan with patient and/or surrogate as well as nursing, discussions with consultants, evaluation of patient's response to treatment, examination of patient, obtaining history from patient or surrogate, ordering and performing  treatments and interventions, ordering and review of laboratory studies, ordering and review of radiographic studies, pulse oximetry and re-evaluation of patient's condition.  Medications Ordered in ED Medications  potassium chloride 10 mEq in 100 mL IVPB (10 mEq Intravenous New Bag/Given 07/29/17 0730)  fentaNYL (SUBLIMAZE) injection 25-50 mcg (50 mcg Intravenous Given 07/29/17 0740)  sodium chloride 0.9 % bolus 1,000 mL (0 mLs Intravenous Stopped 07/29/17 0733)  ondansetron (ZOFRAN) injection 4 mg (4 mg Intravenous Given 07/29/17 0528)  morphine 2 MG/ML injection 4 mg (4 mg Intravenous Given 07/29/17 0528)  gi cocktail (Maalox,Lidocaine,Donnatal) (  30 mLs Oral Given 07/29/17 0529)  sucralfate ([name]) tablet 1 g (1 g Oral Given 07/29/17 0529)  sodium chloride 0.9 % bolus 1,000 mL (0 mLs Intravenous Stopped 07/29/17 0704)  0.9 %  sodium chloride infusion (10 mL/hr Intravenous New Bag/Given 07/29/17 0740)     Initial Impression / Assessment and Plan / ED Course  I have reviewed the triage vital signs and the nursing notes.  Pertinent labs & imaging results that were available during my care of the patient were reviewed by me and considered in my medical decision making (see chart for details).  Abdominal pain and vomiting. Old records are reviewed, and she has prior ED visits with similar complaints. Also, she has had periodic esophageal dilatations. Right upper quadrant ultrasound 4 months ago showed no cholelithiasis. At last EGD, she was also noted to have Candida esophagitis. She will be given IV fluids, and GI cocktail, ondansetron.  There was modest symptomatic improvement with above noted treatment. However, hemoglobin has dropped substantially to 6.0. She has severe leukopenia with WBC 1.2. This is a significant change from her baseline. Hypokalemia is present and is treated with IV potassium because of her vomiting. Elevation of AST and alkaline phosphatase are chronic and unchanged from  baseline. Creatinine and BUN of increased consistent with the effect of dehydration. Because of drop in hemoglobin, blood transfusion has been ordered. Case is discussed with Dr. Olevia Bowens of [facility] who agrees to admit the patient.  Final Clinical Impressions(s) / ED Diagnoses   Final diagnoses:  Intractable vomiting with nausea, unspecified vomiting type  Abdominal pain, unspecified abdominal location  Acute kidney injury (nontraumatic) (HCC)  HIV disease ([place])  Noncompliance with medication regimen  Leukopenia, unspecified type  Hypokalemia  Microcytic anemia    New Prescriptions New Prescriptions   No medications on file     Delora Fuel, MD 19/50/93 [phone]

## 2017-07-29 NOTE — Discharge Instructions (Signed)

## 2017-07-29 NOTE — Consult Note (Signed)
Subjective:   HPI  The patient is a 41 year old female who is admitted to the hospital because of right upper quadrant abdominal pain. She states she has been getting this pain for 2 months. It comes and goes. It occurs worse after eating. An abdominal ultrasound was negative for gallstones. As CT of the abdomen several months ago did not reveal any acute findings. She has a history of monilial esophagitis and an esophageal stricture but tender went EGD with dilatation in a few months ago and currently states she is having no complaints of dysphagia. She states everything is been fine as far that concern since her endoscopy with dilatation several months ago.  Review of Systems No chest pain or shortness of breath  Past Medical History:  Diagnosis Date  . Acute psychosis 03/10/12   2nd admission in last wk for this  . Angio-edema   . Anxiety   . Asthma    inhaler 2xday  . Bipolar disorder ([place])   . Depression   . GERD (gastroesophageal reflux disease)   . Hepatitis B    /E-chart  . History of blood transfusion   . HIV positive ([place])   . Microcytic anemia    h/o per E-chart  . Noncompliance with medication regimen    /e-chart  . 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 Pyelonephritis    h/o per E-chart  . Shortness of breath dyspnea    due to Asthma  . Thyroid disease   . Urticaria    Past Surgical History:  Procedure Laterality Date  . BALLOON DILATION N/A 02/14/2015   Procedure: BALLOON DILATION;  Surgeon: Lear Ng, MD;  Location: [facility] ENDOSCOPY;  Service: Endoscopy;  Laterality: N/A;  . BALLOON DILATION N/A 10/25/2016   Procedure: BALLOON DILATION;  Surgeon: Wilford Corner, MD;  Location: [facility] ENDOSCOPY;  Service: Endoscopy;  Laterality: N/A;  . BALLOON DILATION N/A 01/14/2017   Procedure: BALLOON DILATION;  Surgeon: Wilford Corner, MD;  Location: [facility] ENDOSCOPY;  Service: Endoscopy;  Laterality: N/A;  . BALLOON  DILATION N/A 04/30/2017   Procedure: BALLOON DILATION;  Surgeon: Wilford Corner, MD;  Location: [facility] ENDOSCOPY;  Service: Endoscopy;  Laterality: N/A;  . ESOPHAGOGASTRODUODENOSCOPY N/A 07/13/2013   Procedure: ESOPHAGOGASTRODUODENOSCOPY (EGD);  Surgeon: Beryle Beams, MD;  Location: [facility] ENDOSCOPY;  Service: Endoscopy;  Laterality: N/A;  . ESOPHAGOGASTRODUODENOSCOPY N/A 08/12/2014   Procedure: ESOPHAGOGASTRODUODENOSCOPY (EGD);  Surgeon: Milus Banister, MD;  Location: [facility] ENDOSCOPY;  Service: Endoscopy;  Laterality: N/A;  . ESOPHAGOGASTRODUODENOSCOPY N/A 02/14/2015   Procedure: ESOPHAGOGASTRODUODENOSCOPY (EGD);  Surgeon: Lear Ng, MD;  Location: [facility] ENDOSCOPY;  Service: Endoscopy;  Laterality: N/A;  . ESOPHAGOGASTRODUODENOSCOPY N/A 10/25/2016   Procedure: ESOPHAGOGASTRODUODENOSCOPY (EGD);  Surgeon: Wilford Corner, MD;  Location: [facility] ENDOSCOPY;  Service: Endoscopy;  Laterality: N/A;  . ESOPHAGOGASTRODUODENOSCOPY (EGD) WITH PROPOFOL N/A 04/11/2015   Procedure: ESOPHAGOGASTRODUODENOSCOPY (EGD) WITH PROPOFOL;  Surgeon: Wilford Corner, MD;  Location: [facility] ENDOSCOPY;  Service: Endoscopy;  Laterality: N/A;  . ESOPHAGOGASTRODUODENOSCOPY (EGD) WITH PROPOFOL N/A 09/11/2015   Procedure: ESOPHAGOGASTRODUODENOSCOPY (EGD) WITH PROPOFOL;  Surgeon: Wilford Corner, MD;  Location: [facility] ENDOSCOPY;  Service: Endoscopy;  Laterality: N/A;  . ESOPHAGOGASTRODUODENOSCOPY (EGD) WITH PROPOFOL N/A 01/14/2017   Procedure: ESOPHAGOGASTRODUODENOSCOPY (EGD) WITH PROPOFOL;  Surgeon: Wilford Corner, MD;  Location: [facility] ENDOSCOPY;  Service: Endoscopy;  Laterality: N/A;  . ESOPHAGOGASTRODUODENOSCOPY (EGD) WITH PROPOFOL N/A 04/30/2017   Procedure: ESOPHAGOGASTRODUODENOSCOPY (EGD) WITH PROPOFOL;  Surgeon: Wilford Corner, MD;  Location: [place];  Service: Endoscopy;  Laterality:  N/A;  . SAVORY DILATION N/A 02/14/2015   Procedure: SAVORY DILATION;  Surgeon: Lear Ng, MD;  Location: [facility] ENDOSCOPY;  Service: Endoscopy;   Laterality: N/A;  no xray needed  . SAVORY DILATION N/A 04/11/2015   Procedure: SAVORY DILATION;  Surgeon: Wilford Corner, MD;  Location: [facility] ENDOSCOPY;  Service: Endoscopy;  Laterality: N/A;  . SAVORY DILATION N/A 09/11/2015   Procedure: SAVORY DILATION;  Surgeon: Wilford Corner, MD;  Location: [facility] ENDOSCOPY;  Service: Endoscopy;  Laterality: N/A;  . SAVORY DILATION N/A 04/30/2017   Procedure: SAVORY DILATION;  Surgeon: Wilford Corner, MD;  Location: [place];  Service: Endoscopy;  Laterality: N/A;  . THYROID LOBECTOMY  08/2002   left & isthmectomy; for benign thyroid adenoma/E-chart  . THYROID SURGERY    . TUBAL LIGATION  07/2002   /E-chart   Social History   Social History  . Marital status: Single    Spouse name: N/A  . Number of children: N/A  . Years of education: N/A   Occupational History  . Not on file.   Social History Main Topics  . Smoking status: Never Smoker  . Smokeless tobacco: Never Used  . Alcohol use No  . Drug use: No  . Sexual activity: No     Comment: declined condoms   Other Topics Concern  . Not on file   Social History Narrative  . No narrative on file   family history includes Asthma in her son; Cancer in her mother; Diabetes in her father, mother, and sister; Heart disease in her father; Urticaria in her sister.  Current Facility-Administered Medications:  .  albuterol (PROVENTIL) (2.5 MG/3ML) 0.083% nebulizer solution 3 mL, 3 mL, Inhalation, Q4H PRN, Cruzita Lederer, Costin M, MD .  benztropine (COGENTIN) tablet 1 mg, 1 mg, Oral, Daily, Gherghe, Costin M, MD, 1 mg at 07/29/17 1041 .  dextrose 5 % in lactated ringers infusion, , Intravenous, Continuous, Gherghe, Vella Redhead, MD, Last Rate: 75 mL/hr at 07/29/17 1051 .  elvitegravir-cobicistat-emtricitabine-tenofovir (GENVOYA) 150-150-200-10 MG tablet 1 tablet, 1 tablet, Oral, Q breakfast, Caren Griffins, MD, 1 tablet at 07/29/17 1043 .  fluconazole (DIFLUCAN) IVPB 100 mg, 100 mg, Intravenous, Q24H,  Gherghe, Costin M, MD, Last Rate: 50 mL/hr at 07/29/17 1051, 100 mg at 07/29/17 1051 .  OLANZapine (ZYPREXA) tablet 5 mg, 5 mg, Oral, QHS, Gherghe, Costin M, MD .  ondansetron (ZOFRAN) injection 4 mg, 4 mg, Intravenous, Q6H PRN, Cruzita Lederer, Costin M, MD .  oxyCODONE (Oxy IR/ROXICODONE) immediate release tablet 5 mg, 5 mg, Oral, Q6H PRN, Caren Griffins, MD, 5 mg at 07/29/17 1113 .  pantoprazole (PROTONIX) EC tablet 40 mg, 40 mg, Oral, Daily, Caren Griffins, MD, 40 mg at 07/29/17 1041 .  senna-docusate (Senokot-S) tablet 2 tablet, 2 tablet, Oral, BID, Caren Griffins, MD, 2 tablet at 07/29/17 1043 .  sucralfate ([name]) tablet 1 g, 1 g, Oral, TID WC & HS, Gherghe, Vella Redhead, MD Allergies  Allergen Reactions  . Dapsone Itching and Rash  . Other Hives and Itching    Berries, TREE NUTS  . Peanut-Containing Drug Products Hives  . Shellfish Allergy Hives  . Bactrim Itching  . Orange Fruit Itching  . Sulfa Antibiotics Itching     Objective:     BP 101/64 (BP Location: Right Arm)   Pulse 61   Temp (!) 96.4 F (35.8 C) (Oral)   Resp 15   Ht 6' (1.829 m)   Wt 53.1 kg (117 lb)   LMP 07/22/2017 (  Approximate)   SpO2 100%   BMI 15.87 kg/m   No distress  Nonicteric  Heart regular rhythm no murmurs  Lungs clear  Abdomen: Bowel sounds present, soft, nontender  Laboratory No components found for: D1    Assessment:     Right upper quadrant pain. No evidence of gallstones. Rule out biliary dyskinesia  Multiple other medical problems as noted above.      Plan:     HIDA scan with ejection fraction.

## 2017-07-29 NOTE — Progress Notes (Signed)
Initial Nutrition Assessment  DOCUMENTATION CODES:   Severe malnutrition in context of chronic illness, Underweight  INTERVENTION:   30 ml Prostat BID  Boost Breeze po TID, each supplement provides 250 kcal and 9 grams of protein  Discussed the importance of protein intake for preservation of lean body mass. Included protein supplement information in discharge instructions.   NUTRITION DIAGNOSIS:   Malnutrition (Severe) related to chronic illness (HIV) as evidenced by energy intake < or equal to 50% for > or equal to 1 month, 18% wt loss in 4 months, severe depletion of body fat, severe depletion of muscle mass.  GOAL:   Patient will meet greater than or equal to 90% of their needs  MONITOR:   PO intake, Supplement acceptance, Labs, Weight trends  REASON FOR ASSESSMENT:   Consult Assessment of nutrition requirement/status  ASSESSMENT:   Pt with PMH of Hep B, HIV, medication non-compliance, pyelonephritis, thyroid disease, and acute psychosis. Presents this admission with right upper abdominal pain.    Pt reports having decrease in appetite since May, consuming one meal per day that typically consisted of a vegetable or grain (pintos, green bean, mac and cheese). States her abdominal pain would worsen upon eating, causing her to be afraid to try anything. Pt is not drinking Ensure at home, as it makes her abdominal pain worse. Reports having minimal support at home. Pt has two sons that "do not come around or help". Pt tolerating clear liquids better than full at home.  Discussed what to eat post discharge for steady wt gain. Included protein supplementation in discharge packet. Will try Boost Breeze and Prostat this admission in hopes for increased toleration.  Reports a UBW of 170 lb, last time being in May 2018. Records indicate pt has lost 18% in body wt in 4 months. This is significant for severe malnutrition.   Nutrition-Focused physical exam completed. Findings are severe  fat depletion in orbital, lumbar/thoracic regions, severe muscle depletion in all regions, and no edema.   Medications reviewed and include: LR with D5 @ 75 ml/hr Labs reviewed: K 3.0 (L) Cl 100 (L) CBG 138 BUN 30 (H) Creatinine 1.62 (H) AST 120 (H)  Diet Order:  Diet clear liquid Room service appropriate? Yes; Fluid consistency: Thin  Skin:  Reviewed, no issues  Last BM:  PTA  Height:   Ht Readings from Last 1 Encounters:  07/29/17 6' (1.829 m)    Weight:   Wt Readings from Last 1 Encounters:  07/29/17 117 lb (53.1 kg)    Ideal Body Weight:  72.7 kg  BMI:  Body mass index is 15.87 kg/m.  Estimated Nutritional Needs:   Kcal:  1800-2000 (34-38 kcal/kg)  Protein:  100-110 grams (1.9-2.1 g/kg)  Fluid:  >1.8 L/day  EDUCATION NEEDS:   Education needs addressed  [place], LDN Clinical Nutrition Pager # [phone]

## 2017-07-29 NOTE — ED Notes (Signed)
Bed: WA20 Expected date:  Expected time:  Means of arrival:  Comments: 41 yo F  RUQ pain

## 2017-07-29 NOTE — ED Triage Notes (Signed)
Pt comes to ed, via ems, c/o right side flank pain and right upper quad pain. Pt has chronic started 2 months ago. Nausea and no vomiting. Denies diarrhea.  Hx of HIV and polynepharitis.  V/s 110/88.pulse 70 rr18, spo2 100. Pain 10 out 10.

## 2017-07-30 ENCOUNTER — Observation Stay (HOSPITAL_COMMUNITY): Payer: Medicaid Other

## 2017-07-30 ENCOUNTER — Inpatient Hospital Stay (HOSPITAL_COMMUNITY): Payer: Medicaid Other

## 2017-07-30 DIAGNOSIS — B9689 Other specified bacterial agents as the cause of diseases classified elsewhere: Secondary | ICD-10-CM | POA: Diagnosis not present

## 2017-07-30 DIAGNOSIS — Z9119 Patient's noncompliance with other medical treatment and regimen: Secondary | ICD-10-CM | POA: Diagnosis not present

## 2017-07-30 DIAGNOSIS — K222 Esophageal obstruction: Secondary | ICD-10-CM | POA: Diagnosis present

## 2017-07-30 DIAGNOSIS — R7881 Bacteremia: Secondary | ICD-10-CM | POA: Diagnosis not present

## 2017-07-30 DIAGNOSIS — B20 Human immunodeficiency virus [HIV] disease: Secondary | ICD-10-CM

## 2017-07-30 DIAGNOSIS — E43 Unspecified severe protein-calorie malnutrition: Secondary | ICD-10-CM | POA: Diagnosis present

## 2017-07-30 DIAGNOSIS — R109 Unspecified abdominal pain: Secondary | ICD-10-CM

## 2017-07-30 DIAGNOSIS — R5081 Fever presenting with conditions classified elsewhere: Secondary | ICD-10-CM

## 2017-07-30 DIAGNOSIS — D509 Iron deficiency anemia, unspecified: Secondary | ICD-10-CM | POA: Diagnosis present

## 2017-07-30 DIAGNOSIS — Z9114 Patient's other noncompliance with medication regimen: Secondary | ICD-10-CM

## 2017-07-30 DIAGNOSIS — D61818 Other pancytopenia: Secondary | ICD-10-CM | POA: Diagnosis present

## 2017-07-30 DIAGNOSIS — D709 Neutropenia, unspecified: Secondary | ICD-10-CM | POA: Diagnosis not present

## 2017-07-30 DIAGNOSIS — T68XXXA Hypothermia, initial encounter: Secondary | ICD-10-CM | POA: Diagnosis not present

## 2017-07-30 DIAGNOSIS — A419 Sepsis, unspecified organism: Secondary | ICD-10-CM | POA: Diagnosis not present

## 2017-07-30 DIAGNOSIS — E860 Dehydration: Secondary | ICD-10-CM | POA: Diagnosis present

## 2017-07-30 DIAGNOSIS — J45909 Unspecified asthma, uncomplicated: Secondary | ICD-10-CM | POA: Diagnosis present

## 2017-07-30 DIAGNOSIS — B3781 Candidal esophagitis: Secondary | ICD-10-CM | POA: Diagnosis present

## 2017-07-30 DIAGNOSIS — D72819 Decreased white blood cell count, unspecified: Secondary | ICD-10-CM

## 2017-07-30 DIAGNOSIS — F419 Anxiety disorder, unspecified: Secondary | ICD-10-CM | POA: Diagnosis present

## 2017-07-30 DIAGNOSIS — Z8249 Family history of ischemic heart disease and other diseases of the circulatory system: Secondary | ICD-10-CM | POA: Diagnosis not present

## 2017-07-30 DIAGNOSIS — R112 Nausea with vomiting, unspecified: Secondary | ICD-10-CM

## 2017-07-30 DIAGNOSIS — K819 Cholecystitis, unspecified: Secondary | ICD-10-CM | POA: Diagnosis present

## 2017-07-30 DIAGNOSIS — N179 Acute kidney failure, unspecified: Secondary | ICD-10-CM

## 2017-07-30 DIAGNOSIS — F319 Bipolar disorder, unspecified: Secondary | ICD-10-CM | POA: Diagnosis present

## 2017-07-30 DIAGNOSIS — K210 Gastro-esophageal reflux disease with esophagitis: Secondary | ICD-10-CM | POA: Diagnosis present

## 2017-07-30 DIAGNOSIS — R0902 Hypoxemia: Secondary | ICD-10-CM | POA: Diagnosis present

## 2017-07-30 DIAGNOSIS — E872 Acidosis: Secondary | ICD-10-CM | POA: Diagnosis present

## 2017-07-30 DIAGNOSIS — F3131 Bipolar disorder, current episode depressed, mild: Secondary | ICD-10-CM

## 2017-07-30 DIAGNOSIS — Z681 Body mass index (BMI) 19 or less, adult: Secondary | ICD-10-CM | POA: Diagnosis not present

## 2017-07-30 DIAGNOSIS — E876 Hypokalemia: Secondary | ICD-10-CM

## 2017-07-30 DIAGNOSIS — Z833 Family history of diabetes mellitus: Secondary | ICD-10-CM | POA: Diagnosis not present

## 2017-07-30 DIAGNOSIS — Z79899 Other long term (current) drug therapy: Secondary | ICD-10-CM | POA: Diagnosis not present

## 2017-07-30 DIAGNOSIS — R1310 Dysphagia, unspecified: Secondary | ICD-10-CM | POA: Diagnosis not present

## 2017-07-30 DIAGNOSIS — K219 Gastro-esophageal reflux disease without esophagitis: Secondary | ICD-10-CM | POA: Diagnosis not present

## 2017-07-30 DIAGNOSIS — D703 Neutropenia due to infection: Secondary | ICD-10-CM | POA: Diagnosis present

## 2017-07-30 DIAGNOSIS — K5909 Other constipation: Secondary | ICD-10-CM | POA: Diagnosis present

## 2017-07-30 LAB — TYPE AND SCREEN
ABO/RH(D): A POS
Antibody Screen: NEGATIVE
Unit division: 0
Unit division: 0

## 2017-07-30 LAB — COMPREHENSIVE METABOLIC PANEL
ALK PHOS: 161 U/L — AB (ref 38–126)
ALT: 33 U/L (ref 14–54)
ANION GAP: 7 (ref 5–15)
AST: 70 U/L — ABNORMAL HIGH (ref 15–41)
Albumin: 2.6 g/dL — ABNORMAL LOW (ref 3.5–5.0)
BUN: 28 mg/dL — AB (ref 6–20)
CHLORIDE: 107 mmol/L (ref 101–111)
CO2: 22 mmol/L (ref 22–32)
CREATININE: 1.81 mg/dL — AB (ref 0.44–1.00)
Calcium: 8.1 mg/dL — ABNORMAL LOW (ref 8.9–10.3)
GFR calc non Af Amer: 34 mL/min — ABNORMAL LOW (ref >60)
GFR, EST AFRICAN AMERICAN: 39 mL/min — AB (ref >60)
Glucose, Bld: 101 mg/dL — ABNORMAL HIGH (ref 65–99)
Potassium: 3.2 mmol/L — ABNORMAL LOW (ref 3.5–5.1)
SODIUM: 136 mmol/L (ref 135–145)
Total Bilirubin: 1.3 mg/dL — ABNORMAL HIGH (ref 0.3–1.2)
Total Protein: 5.8 g/dL — ABNORMAL LOW (ref 6.5–8.1)

## 2017-07-30 LAB — LACTIC ACID, PLASMA
LACTIC ACID, VENOUS: 2.3 mmol/L — AB (ref 0.5–1.9)
LACTIC ACID, VENOUS: 0.8 mmol/L (ref 0.5–1.9)
Lactic Acid, Venous: 0.5 mmol/L (ref 0.5–1.9)

## 2017-07-30 LAB — CBC
HCT: 29.3 % — ABNORMAL LOW (ref 36.0–46.0)
Hemoglobin: 9.9 g/dL — ABNORMAL LOW (ref 12.0–15.0)
MCH: 23.9 pg — AB (ref 26.0–34.0)
MCHC: 33.8 g/dL (ref 30.0–36.0)
MCV: 70.6 fL — ABNORMAL LOW (ref 78.0–100.0)
PLATELETS: 137 10*3/uL — AB (ref 150–400)
RBC: 4.15 MIL/uL (ref 3.87–5.11)
RDW: 21.4 % — ABNORMAL HIGH (ref 11.5–15.5)
WBC: 0.8 10*3/uL — CL (ref 4.0–10.5)

## 2017-07-30 LAB — BPAM RBC
BLOOD PRODUCT EXPIRATION DATE: 201808292359
BLOOD PRODUCT EXPIRATION DATE: 201808292359
ISSUE DATE / TIME: 201808141742
ISSUE DATE / TIME: 201808141411
UNIT TYPE AND RH: 6200
Unit Type and Rh: 6200

## 2017-07-30 LAB — APTT: APTT: 32 s (ref 24–36)

## 2017-07-30 LAB — HIV-1 RNA QUANT-NO REFLEX-BLD
HIV 1 RNA QUANT: 849000 {copies}/mL
LOG10 HIV-1 RNA: 5.929 log10copy/mL

## 2017-07-30 LAB — T-HELPER CELLS (CD4) COUNT (NOT AT ARMC)
CD4 % Helper T Cell: 5 % — ABNORMAL LOW (ref 33–55)
CD4 T CELL ABS: 10 /uL — AB (ref 400–2700)

## 2017-07-30 LAB — OCCULT BLOOD X 1 CARD TO LAB, STOOL: FECAL OCCULT BLD: NEGATIVE

## 2017-07-30 LAB — PROTIME-INR
INR: 1.11
Prothrombin Time: 14.3 seconds (ref 11.4–15.2)

## 2017-07-30 LAB — PROCALCITONIN: Procalcitonin: 1 ng/mL

## 2017-07-30 MED ORDER — FLUCONAZOLE IN SODIUM CHLORIDE 100-0.9 MG/50ML-% IV SOLN
100.0000 mg | INTRAVENOUS | Status: DC
  Filled 2017-07-30: qty 50

## 2017-07-30 MED ORDER — POTASSIUM CHLORIDE 20 MEQ/15ML (10%) PO SOLN
40.0000 meq | Freq: Once | ORAL | Status: AC
  Administered 2017-07-30: 40 meq via ORAL
  Filled 2017-07-30: qty 30

## 2017-07-30 MED ORDER — TECHNETIUM TC 99M MEBROFENIN IV KIT
5.2000 | PACK | Freq: Once | INTRAVENOUS | Status: AC | PRN
  Administered 2017-07-30: 5.2 via INTRAVENOUS

## 2017-07-30 MED ORDER — SODIUM CHLORIDE 0.9 % IV BOLUS (SEPSIS)
1000.0000 mL | Freq: Once | INTRAVENOUS | Status: AC
  Administered 2017-07-30: 1000 mL via INTRAVENOUS

## 2017-07-30 MED ORDER — VANCOMYCIN HCL IN DEXTROSE 750-5 MG/150ML-% IV SOLN
750.0000 mg | INTRAVENOUS | Status: DC
  Administered 2017-07-31: 750 mg via INTRAVENOUS
  Filled 2017-07-30 (×2): qty 150

## 2017-07-30 MED ORDER — FLUCONAZOLE IN SODIUM CHLORIDE 100-0.9 MG/50ML-% IV SOLN
100.0000 mg | INTRAVENOUS | Status: DC
  Administered 2017-07-30: 100 mg via INTRAVENOUS
  Filled 2017-07-30 (×2): qty 50

## 2017-07-30 MED ORDER — PIPERACILLIN-TAZOBACTAM 3.375 G IVPB 30 MIN: 3.3750 g | Freq: Once | INTRAVENOUS | Status: DC

## 2017-07-30 MED ORDER — PIPERACILLIN-TAZOBACTAM 3.375 G IVPB 30 MIN
3.3750 g | INTRAVENOUS | Status: AC
Start: 2017-07-30 — End: 2017-07-30
  Administered 2017-07-30: 3.375 g via INTRAVENOUS
  Filled 2017-07-30: qty 50

## 2017-07-30 MED ORDER — ACETAMINOPHEN 325 MG PO TABS: 325.0000 mg | ORAL_TABLET | Freq: Once | ORAL | Status: DC

## 2017-07-30 MED ORDER — SODIUM CHLORIDE 0.9 % IV SOLN
INTRAVENOUS | Status: AC
  Administered 2017-07-30: 17:00:00 via INTRAVENOUS

## 2017-07-30 MED ORDER — SODIUM CHLORIDE 0.9 % IV SOLN
INTRAVENOUS | Status: DC
  Administered 2017-07-30: 11:00:00 via INTRAVENOUS

## 2017-07-30 MED ORDER — SODIUM CHLORIDE 0.9 % IV BOLUS (SEPSIS): 250.0000 mL | Freq: Once | INTRAVENOUS | Status: DC

## 2017-07-30 MED ORDER — PIPERACILLIN-TAZOBACTAM 3.375 G IVPB
3.3750 g | Freq: Three times a day (TID) | INTRAVENOUS | Status: DC
  Administered 2017-07-30 – 2017-08-02 (×8): 3.375 g via INTRAVENOUS
  Filled 2017-07-30 (×9): qty 50

## 2017-07-30 MED ORDER — VANCOMYCIN HCL IN DEXTROSE 1-5 GM/200ML-% IV SOLN
1000.0000 mg | Freq: Once | INTRAVENOUS | Status: AC
  Administered 2017-07-30: 1000 mg via INTRAVENOUS
  Filled 2017-07-30: qty 200

## 2017-07-30 NOTE — Progress Notes (Addendum)
Pharmacy Antibiotic Note  [name] is a 41 y.o. female who presented to [facility] ED on 07/29/2017 with RUQ pain for 2 months associated with emesis and weight loss. Pharmacy has been consulted for Zosyn dosing for intra-abdominal infection.  Plan: Zosyn 3.375g IV x 1 over 30 minutes, then continue with Zosyn 3.375g IV q8h (each dose infused over 4 hours). Monitor renal function, culture results as available, clinical course.   Height: 6' (182.9 cm) Weight: 117 lb (53.1 kg) IBW/kg (Calculated) : 73.1  Temp (24hrs), Avg:99 F (37.2 C), Min:97.4 F (36.3 C), Max:102.4 F (39.1 C)   Recent Labs Lab 07/29/17 0520 07/30/17 0832  WBC 1.2* 0.8*  CREATININE 1.62* 1.81*    Estimated Creatinine Clearance: 34.6 mL/min (A) (by C-G formula based on SCr of 1.81 mg/dL (H)).    Allergies  Allergen Reactions  . Dapsone Itching and Rash  . Other Hives and Itching    Berries, TREE NUTS  . Peanut-Containing Drug Products Hives  . Shellfish Allergy Hives  . Bactrim Itching  . Orange Fruit Itching  . Sulfa Antibiotics Itching    Antimicrobials this admission: 8/14 >> Fluconazole >> 8/15 >> Zosyn >>  Dose adjustments this admission: --  Microbiology results: None ordered  Thank you for allowing pharmacy to be a part of this patient's care.   Lindell Spar, PharmD, BCPS Pager: [phone] 07/30/2017 4:02 PM    Addendum:   Vancomycin added per MD for sepsis. Blood cultures ordered.    Vancomycin 1g IV x 1, then Vancomycin 750mg  IV q24h. Plan for Vancomycin trough level at steady state.  Monitor daily SCr while on concurrent Vancomycin and Zosyn therapy.  F/u cultures, clinical course.    Lindell Spar, PharmD, BCPS Pager: [phone] 07/30/2017 5:37 PM

## 2017-07-30 NOTE — Progress Notes (Signed)
CRITICAL VALUE ALERT  Critical Value:  Lactic acid 2.3  Date & Time Notied:  07/30/17 1740  Provider Notified: Aileen Fass   Orders Received/Actions taken: Awaiting Response. No new orders at this time

## 2017-07-30 NOTE — Progress Notes (Signed)
Pt came back from nuclear medicine.Had 2 loose bowel movements once back in room. BP was 91/51 and she is tachycardic. When ambulating to the bathroom heart rate ranges from 120's-140's. Informed Attending and he wants pt to be on bedrest, check orthostatic vitals, and hold any hypertensive medications.

## 2017-07-30 NOTE — Progress Notes (Addendum)
[facility] PROGRESS NOTE    Progress Note  [name]  OVF:[id number] DOB: 15-Feb-1976 DOA: 07/29/2017 PCP: Nolene Ebbs, MD     Brief Narrative:   [name] is an 41 y.o. female past medical history of HIV noncompliant, dysphagia due to Candida, microcytic anemia hepatitis B bipolar disorder who presents to the was a long ED with chief complaint of right upper quadrant pain for 2 months associated with emesis and weight loss, she reports that her pain started on 04/30/2017 after her EGD with esophageal dilation for which she did not complete her Diflucan treatment. Since then she's been having dysphasia worse with solid foods and liquids made better with heating pads meloxicam.  Assessment/Plan:   Acute on chronic Right upper quadrant pain associated with emesis: - Right upper origin ultrasound and chest x-ray showed no acute findings. Her bilirubin started to trend up. 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 - We'll start her on empiric antibiotics, will consult general surgery.  Sepsis/bacteremia due to streptococcal species/Neutropenic fever: We'll go ahead and start her on the sepsis pathway, will start aggressive IV fluid hydration, start her on empiric antibiotics IV vancomycin and Zosyn. Blood cultures ID reflex showed streptococcal species, will repeat blood cultures tomorrow.  HIV with leukopenia and medication noncompliance: - Check HIV RNA quant and CD4 count - Continue home genvoya.  Esophageal strictures and stenosis associated with dysphagia in the setting of Candida esophagitis: She is noncompliant with her medication, continue IV Diflucan.  Microcytic anemia: Hemoglobin usually ranges around 8.7-9, on admission it was 6.0. Status post 2 units of packed red blood cells, anemia panel is unreliable as ferritin is acting as an acute phase reactant.  GERD: She was not taking her Protonix, Zyprexa and  [name] due to nausea vomiting and abdominal pain.  Hypokalemia: Likely due to emesis. Replete orally if tolerated if not can switch to IV.  Acute kidney injury: Baseline creatinine less than 1 Likely prerenal, Worsening, will change IV fluids to normal saline. Get a renal ultrasound monitor strict I's and O's. Microalbumin to creatinine ratio. Check a renal function in the morning.  History of asthma: Continue current home medications.  Bipolar disorder: Resume home medications.  Severe protein caloric malnutrition: Consult nutrition  DVT prophylaxis: SCD Family Communication:none Disposition Plan/Barrier to D/C: unable to detemrine Code Status:     Code Status Orders        Start     Ordered   07/29/17 0846  Full code  Continuous     07/29/17 0846    Code Status History    Date Active Date Inactive Code Status Order ID Comments User Context   10/23/2016 10:55 PM 10/28/2016  6:40 PM Full Code [id number]  Edwin Dada, MD Inpatient   12/06/2014  1:13 AM 12/12/2014  5:25 PM Full Code [id number]  Waldemar Dickens, MD Inpatient   08/10/2014  1:58 PM 08/20/2014  5:10 PM Full Code [id number]  Theodis Blaze, MD Inpatient   12/27/2013 10:14 PM 12/29/2013  4:57 PM Full Code [id number]  Annita Brod, MD Inpatient   07/09/2013  4:31 PM 07/16/2013  1:54 PM Full Code [id number]  Theodis Blaze, MD Inpatient   03/05/2012  5:35 PM 03/08/2012  6:56 PM Full Code [id number]  Delora Fuel, MD ED        IV Access:    Peripheral IV   Procedures and diagnostic studies:   Dg Chest 2 View  Result Date:  07/29/2017 CLINICAL DATA:  Shortness of breath.  Hypoxia. EXAM: CHEST  2 VIEW COMPARISON:  12/05/2014 . FINDINGS: Mediastinum hilar structures normal. Lungs are clear. No pleural effusion or pneumothorax. Nipple shadows again noted. IMPRESSION: No active cardiopulmonary disease. Electronically Signed   By: Frederickson   On: 07/29/2017 10:26   US Abdomen Limited Ruq  Result  Date: 07/29/2017 CLINICAL DATA:  Abdominal pain for 2 months. EXAM: ULTRASOUND ABDOMEN LIMITED RIGHT UPPER QUADRANT COMPARISON:  None. FINDINGS: Gallbladder: No gallstones or wall thickening visualized. No sonographic Murphy sign noted by sonographer. Common bile duct: Diameter: 2.6 mm, normal. Liver: No focal lesion identified. Within normal limits in parenchymal echogenicity. IMPRESSION: Normal exam. Electronically Signed   By: Lorriane Shire M.D.   On: 07/29/2017 10:12     Medical Consultants:    None.  Anti-Infectives:     Subjective:    [place] she continues to have abdominal pain.  Objective:    Vitals:   07/29/17 1805 07/29/17 2200 07/30/17 0501 07/30/17 0535  BP: 113/81 125/80 108/68   Pulse: (!) 57 65 93   Resp: 15 16 18    Temp: (!) 97.5 F (36.4 C) 98.2 F (36.8 C) (!) 102.4 F (39.1 C) 100.3 F (37.9 C)  TempSrc: Oral Oral Oral Oral  SpO2: 100% 100% 97%   Weight:      Height:        Intake/Output Summary (Last 24 hours) at 07/30/17 0902 Last data filed at 07/29/17 2215  Gross per 24 hour  Intake           1672.5 ml  Output              200 ml  Net           1472.5 ml   Filed Weights   07/29/17 1034  Weight: 53.1 kg (117 lb)    Exam: General exam: In no acute distress, Cachectic Respiratory system: Good air movement and clear to auscultation. Cardiovascular system: S1 & S2 heard, RRR. No JVD, murmurs, rubs, gallops or clicks.  Gastrointestinal system: Abdomen is soft with right upper quadrant and epigastric tenderness no rebound or guarding. Central nervous system: Awake alert and oriented 3 nonfocal. Extremities: Lower extremity edema. Skin: No rashes, lesions or ulcers Psychiatry: Judgment and insight appear normal.   Data Reviewed:    Labs: Basic Metabolic Panel:  Recent Labs Lab 07/29/17 0520  NA 137  K 3.0*  CL 100*  CO2 22  GLUCOSE 138*  BUN 30*  CREATININE 1.62*  CALCIUM 8.8*   GFR Estimated Creatinine Clearance:  38.7 mL/min (A) (by C-G formula based on SCr of 1.62 mg/dL (H)). Liver Function Tests:  Recent Labs Lab 07/29/17 0520  AST 120*  ALT 52  ALKPHOS 203*  BILITOT 1.2  PROT 6.9  ALBUMIN 3.1*    Recent Labs Lab 07/29/17 0520  LIPASE 30   No results for input(s): AMMONIA in the last 168 hours. Coagulation profile No results for input(s): INR, PROTIME in the last 168 hours.  CBC:  Recent Labs Lab 07/29/17 0520  WBC 1.2*  NEUTROABS 0.9*  HGB 6.0*  HCT 18.5*  MCV 62.5*  PLT 205   Cardiac Enzymes: No results for input(s): CKTOTAL, CKMB, CKMBINDEX, TROPONINI in the last 168 hours. BNP (last 3 results) No results for input(s): PROBNP in the last 8760 hours. CBG: No results for input(s): GLUCAP in the last 168 hours. D-Dimer: No results for input(s): DDIMER in the last 72 hours.  Hgb A1c: No results for input(s): HGBA1C in the last 72 hours. Lipid Profile: No results for input(s): CHOL, HDL, LDLCALC, TRIG, CHOLHDL, LDLDIRECT in the last 72 hours. Thyroid function studies: No results for input(s): TSH, T4TOTAL, T3FREE, THYROIDAB in the last 72 hours.  Invalid input(s): FREET3 Anemia work up:  Recent Labs  07/29/17 1329  VITAMINB12 2,842*  FOLATE 5.2*  FERRITIN 1,140*  TIBC 196*  IRON 20*  RETICCTPCT <0.4*   Sepsis Labs:  Recent Labs Lab 07/29/17 0520  WBC 1.2*   Microbiology No results found for this or any previous visit (from the past 240 hour(s)).   Medications:   . acetaminophen  325 mg Oral Once  . benztropine  1 mg Oral Daily  . elvitegravir-cobicistat-emtricitabine-tenofovir  1 tablet Oral Q breakfast  . feeding supplement  1 Container Oral TID BM  . feeding supplement (PRO-STAT SUGAR FREE 64)  30 mL Oral BID  . OLANZapine  5 mg Oral QHS  . pantoprazole  40 mg Oral Daily  . senna-docusate  2 tablet Oral BID  . sucralfate  1 g Oral TID WC & HS   Continuous Infusions: . sodium chloride 75 mL/hr at 07/29/17 2215  . dextrose 5% lactated  ringers 75 mL/hr at 07/29/17 1051  . fluconazole (DIFLUCAN) IV        LOS: 0 days   Charlynne Cousins  [facility] Pager [phone]  *Please refer to [email or web address], password [email or web address] to get updated schedule on who will round on this patient, as hospitalists switch teams weekly. If 7PM-7AM, please contact night-coverage at [email or web address], password [email or web address] for any overnight needs.  07/30/2017, 9:02 AM

## 2017-07-30 NOTE — Care Management Note (Signed)
Case Management Note  Patient Details  Name: [name] MRN: [id number] Date of Birth: Sep 05, 1976  Subjective/Objective:     observation               Action/Plan: Date:  July 30, 2017 Chart reviewed for concurrent status and case management needs. Will continue to follow patient progress. Discharge Planning: following for needs Expected discharge date: [id number] Velva Harman, BSN, [place], [place]  Expected Discharge Date:   (unknown)               Expected Discharge Plan:  Home/Self Care  In-House Referral:     Discharge planning Services  CM Consult  Post Acute Care Choice:    Choice offered to:     DME Arranged:    DME Agency:     HH Arranged:    [place] Agency:     Status of Service:  In process, will continue to follow  If discussed at Long Length of Stay Meetings, dates discussed:    Additional Comments:  Leeroy Cha, RN 07/30/2017, 9:02 AM

## 2017-07-30 NOTE — Consult Note (Signed)
Reason for Consult: Right upper quadrant abdominal pain  Referring Physician: Gladys Deckard is an 41 y.o. female.  HPI: I was asked to evaluate this patient for possible symptomatic gallbladder disease. She  is a 41 y.o. 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 She states that this has been going on for at least 2 months. Her symptoms are gradually worsening. She is followed regularly by GI due to esophageal stricture. Had a dilatation in May that relieved her dysphagia which has not recurred. She describes pain in her right upper quadrant that is sharp and sometimes also difficult to characterize. Some radiation slightly around toward her back. She states the pain is definitely worse when she tries to eat and she will get occasional vomiting. She has not been eating much at all over the past 2 months to try to avoid the pain and has lost a significant amount of weight. She has chronic constipation that she unchanged. No fevers or chills until this hospitalization..  Past Medical History:  Diagnosis Date  . Acute psychosis 03/10/12   2nd admission in last wk for this  . Angio-edema   . Anxiety   . Asthma    inhaler 2xday  . Bipolar disorder ([place])   . Depression   . GERD (gastroesophageal reflux disease)   . Hepatitis B    /E-chart  . History of blood transfusion   . HIV positive ([place])   . Microcytic anemia    h/o per E-chart  . Noncompliance with medication regimen    /e-chart  . 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 Pyelonephritis    h/o per E-chart  . Shortness of breath dyspnea    due to Asthma  . Thyroid disease   .  Urticaria     Past Surgical History:  Procedure Laterality Date  . BALLOON DILATION N/A 02/14/2015   Procedure: BALLOON DILATION;  Surgeon: Lear Ng, MD;  Location: [facility] ENDOSCOPY;  Service: Endoscopy;  Laterality: N/A;  . BALLOON DILATION N/A 10/25/2016   Procedure: BALLOON DILATION;  Surgeon: Wilford Corner, MD;  Location: [facility] ENDOSCOPY;  Service: Endoscopy;  Laterality: N/A;  . BALLOON DILATION N/A 01/14/2017   Procedure: BALLOON DILATION;  Surgeon: Wilford Corner, MD;  Location: [facility] ENDOSCOPY;  Service: Endoscopy;  Laterality: N/A;  . BALLOON DILATION N/A 04/30/2017   Procedure: BALLOON DILATION;  Surgeon: Wilford Corner, MD;  Location: [facility] ENDOSCOPY;  Service: Endoscopy;  Laterality: N/A;  . ESOPHAGOGASTRODUODENOSCOPY N/A 07/13/2013   Procedure: ESOPHAGOGASTRODUODENOSCOPY (EGD);  Surgeon: Beryle Beams, MD;  Location: [facility] ENDOSCOPY;  Service: Endoscopy;  Laterality: N/A;  . ESOPHAGOGASTRODUODENOSCOPY N/A 08/12/2014   Procedure: ESOPHAGOGASTRODUODENOSCOPY (EGD);  Surgeon: Milus Banister, MD;  Location: [facility] ENDOSCOPY;  Service: Endoscopy;  Laterality: N/A;  . ESOPHAGOGASTRODUODENOSCOPY N/A 02/14/2015   Procedure: ESOPHAGOGASTRODUODENOSCOPY (EGD);  Surgeon: Lear Ng, MD;  Location: [facility] ENDOSCOPY;  Service: Endoscopy;  Laterality: N/A;  . ESOPHAGOGASTRODUODENOSCOPY N/A 10/25/2016   Procedure: ESOPHAGOGASTRODUODENOSCOPY (EGD);  Surgeon: Wilford Corner, MD;  Location: [facility] ENDOSCOPY;  Service: Endoscopy;  Laterality: N/A;  . ESOPHAGOGASTRODUODENOSCOPY (EGD) WITH PROPOFOL N/A 04/11/2015   Procedure: ESOPHAGOGASTRODUODENOSCOPY (EGD) WITH PROPOFOL;  Surgeon: Wilford Corner, MD;  Location: [facility] ENDOSCOPY;  Service: Endoscopy;  Laterality: N/A;  .  ESOPHAGOGASTRODUODENOSCOPY (EGD) WITH PROPOFOL N/A 09/11/2015   Procedure: ESOPHAGOGASTRODUODENOSCOPY (EGD) WITH PROPOFOL;  Surgeon: Wilford Corner, MD;  Location: [facility] ENDOSCOPY;  Service: Endoscopy;  Laterality: N/A;  .  ESOPHAGOGASTRODUODENOSCOPY (EGD) WITH PROPOFOL N/A 01/14/2017   Procedure: ESOPHAGOGASTRODUODENOSCOPY (EGD) WITH PROPOFOL;  Surgeon: Wilford Corner, MD;  Location: [facility] ENDOSCOPY;  Service: Endoscopy;  Laterality: N/A;  . ESOPHAGOGASTRODUODENOSCOPY (EGD) WITH PROPOFOL N/A 04/30/2017   Procedure: ESOPHAGOGASTRODUODENOSCOPY (EGD) WITH PROPOFOL;  Surgeon: Wilford Corner, MD;  Location: [place];  Service: Endoscopy;  Laterality: N/A;  . SAVORY DILATION N/A 02/14/2015   Procedure: SAVORY DILATION;  Surgeon: Lear Ng, MD;  Location: [place];  Service: Endoscopy;  Laterality: N/A;  no xray needed  . SAVORY DILATION N/A 04/11/2015   Procedure: SAVORY DILATION;  Surgeon: Wilford Corner, MD;  Location: [facility] ENDOSCOPY;  Service: Endoscopy;  Laterality: N/A;  . SAVORY DILATION N/A 09/11/2015   Procedure: SAVORY DILATION;  Surgeon: Wilford Corner, MD;  Location: [facility] ENDOSCOPY;  Service: Endoscopy;  Laterality: N/A;  . SAVORY DILATION N/A 04/30/2017   Procedure: SAVORY DILATION;  Surgeon: Wilford Corner, MD;  Location: [place];  Service: Endoscopy;  Laterality: N/A;  . THYROID LOBECTOMY  08/2002   left & isthmectomy; for benign thyroid adenoma/E-chart  . THYROID SURGERY    . TUBAL LIGATION  07/2002   /E-chart    Family History  Problem Relation Age of Onset  . Cancer Mother   . Diabetes Mother   . Diabetes Father   . Heart disease Father   . Diabetes Sister   . Urticaria Sister   . Asthma Son   . Allergic rhinitis Neg Hx   . Eczema Neg Hx   . Immunodeficiency Neg Hx     Social History:  reports that she has never smoked. She has never used smokeless tobacco. She reports that she does not drink alcohol or use drugs.  Allergies:  Allergies  Allergen Reactions  . Dapsone Itching and Rash  . Other Hives and Itching    Berries, TREE NUTS  . Peanut-Containing Drug Products Hives  . Shellfish Allergy Hives  . Bactrim Itching  . Orange Fruit Itching  . Sulfa Antibiotics  Itching    Current Facility-Administered Medications  Medication Dose Route Frequency Provider Last Rate Last Dose  . 0.9 %  sodium chloride infusion   Intravenous Continuous Charlynne Cousins, MD 125 mL/hr at 07/30/17 1651    . acetaminophen (TYLENOL) tablet 325 mg  325 mg Oral Once Rise Patience, MD      . albuterol (PROVENTIL) (2.5 MG/3ML) 0.083% nebulizer solution 3 mL  3 mL Inhalation Q4H PRN Caren Griffins, MD      . benztropine (COGENTIN) tablet 1 mg  1 mg Oral Daily Caren Griffins, MD   1 mg at 07/30/17 1441  . elvitegravir-cobicistat-emtricitabine-tenofovir (GENVOYA) 150-150-200-10 MG tablet 1 tablet  1 tablet Oral Q breakfast Caren Griffins, MD   1 tablet at 07/30/17 1651  . feeding supplement (BOOST / RESOURCE BREEZE) liquid 1 Container  1 Container Oral TID BM Caren Griffins, MD   1 Container at 07/29/17 2213  . feeding supplement (PRO-STAT SUGAR FREE 64) liquid 30 mL  30 mL Oral BID Caren Griffins, MD   30 mL at 07/29/17 2206  . fluconazole (DIFLUCAN) IVPB 100 mg  100 mg Intravenous Q24H Charlynne Cousins, MD   Stopped at 07/30/17 1542  . OLANZapine (ZYPREXA) tablet 5 mg  5 mg Oral QHS Caren Griffins, MD  5 mg at 07/29/17 2206  . ondansetron (ZOFRAN) injection 4 mg  4 mg Intravenous Q6H PRN Caren Griffins, MD   4 mg at 07/29/17 1406  . oxyCODONE (Oxy IR/ROXICODONE) immediate release tablet 5 mg  5 mg Oral Q6H PRN Caren Griffins, MD   5 mg at 07/29/17 2358  . pantoprazole (PROTONIX) EC tablet 40 mg  40 mg Oral Daily Caren Griffins, MD   40 mg at 07/30/17 1441  . piperacillin-tazobactam (ZOSYN) IVPB 3.375 g  3.375 g Intravenous Q8H Luiz Ochoa, RPH      . senna-docusate (Senokot-S) tablet 2 tablet  2 tablet Oral BID Caren Griffins, MD   2 tablet at 07/29/17 2206  . sodium chloride 0.9 % bolus 250 mL  250 mL Intravenous Once Aileen Fass, Tammi Klippel, MD      . sucralfate ([name]) tablet 1 g  1 g Oral TID WC & HS Caren Griffins, MD   1 g  at 07/30/17 1441  . vancomycin (VANCOCIN) IVPB 1000 mg/200 mL premix  1,000 mg Intravenous Once Charlynne Cousins, MD 200 mL/hr at 07/30/17 1818 1,000 mg at 07/30/17 1818  . [START ON 07/31/2017] vancomycin (VANCOCIN) IVPB 750 mg/150 ml premix  750 mg Intravenous Q24H Luiz Ochoa, [facility]         Results for orders placed or performed during the hospital encounter of 07/29/17 (from the past 48 hour(s))  Comprehensive metabolic panel     Status: Abnormal   Collection Time: 07/29/17  5:20 AM  Result Value Ref Range   Sodium 137 135 - 145 mmol/L   Potassium 3.0 (L) 3.5 - 5.1 mmol/L   Chloride 100 (L) 101 - 111 mmol/L   CO2 22 22 - 32 mmol/L   Glucose, Bld 138 (H) 65 - 99 mg/dL   BUN 30 (H) 6 - 20 mg/dL   Creatinine, Ser 1.62 (H) 0.44 - 1.00 mg/dL   Calcium 8.8 (L) 8.9 - 10.3 mg/dL   Total Protein 6.9 6.5 - 8.1 g/dL   Albumin 3.1 (L) 3.5 - 5.0 g/dL   AST 120 (H) 15 - 41 U/L   ALT 52 14 - 54 U/L   Alkaline Phosphatase 203 (H) 38 - 126 U/L   Total Bilirubin 1.2 0.3 - 1.2 mg/dL   GFR calc non Af Amer 39 (L) >60 mL/min   GFR calc Af Amer 45 (L) >60 mL/min    Comment: (NOTE) The eGFR has been calculated using the CKD EPI equation. This calculation has not been validated in all clinical situations. eGFR's persistently <60 mL/min signify possible Chronic Kidney Disease.    Anion gap 15 5 - 15  Lipase, blood     Status: None   Collection Time: 07/29/17  5:20 AM  Result Value Ref Range   Lipase 30 11 - 51 U/L  CBC with Differential     Status: Abnormal   Collection Time: 07/29/17  5:20 AM  Result Value Ref Range   WBC 1.2 (LL) 4.0 - 10.5 K/uL    Comment: REPEATED TO VERIFY CRITICAL RESULT CALLED TO, READ BACK BY AND VERIFIED WITH: [facility] DOSTER,RN [id number] @ 0552 BY J SCOTTON    RBC 2.96 (L) 3.87 - 5.11 MIL/uL   Hemoglobin 6.0 (LL) 12.0 - 15.0 g/dL    Comment: REPEATED TO VERIFY CRITICAL RESULT CALLED TO, READ BACK BY AND VERIFIED WITH: [facility] DOSTER,RN [id number] @ 0552 BY J SCOTTON     HCT 18.5 (L) 36.0 - 46.0 %  MCV 62.5 (L) 78.0 - 100.0 fL   MCH 20.3 (L) 26.0 - 34.0 pg   MCHC 32.4 30.0 - 36.0 g/dL   RDW 18.2 (H) 11.5 - 15.5 %   Platelets 205 150 - 400 K/uL   Neutrophils Relative % 71 %   Lymphocytes Relative 9 %   Monocytes Relative 18 %   Eosinophils Relative 2 %   Basophils Relative 0 %   Neutro Abs 0.9 (L) 1.7 - 7.7 K/uL   Lymphs Abs 0.1 (L) 0.7 - 4.0 K/uL   Monocytes Absolute 0.2 0.1 - 1.0 K/uL   Eosinophils Absolute 0.0 0.0 - 0.7 K/uL   Basophils Absolute 0.0 0.0 - 0.1 K/uL   RBC Morphology TARGET CELLS     Comment: TEARDROP CELLS ELLIPTOCYTES    WBC Morphology WHITE COUNT CONFIRMED ON SMEAR   Pathologist smear review     Status: None   Collection Time: 07/29/17  5:20 AM  Result Value Ref Range   Path Review Reviewed By Violet Baldy, M.D.     Comment: 8.14.18 MICROCYTIC ANEMIA AND LEUKOPENIA.   T-helper cells (CD4) count (not at [facility])     Status: Abnormal   Collection Time: 07/29/17  7:10 AM  Result Value Ref Range   CD4 T Cell Abs 10 (L) 400 - 2,700 /uL   CD4 % Helper T Cell 5 (L) 33 - 55 %  Type and screen [place]     Status: None   Collection Time: 07/29/17 10:36 AM  Result Value Ref Range   ABO/RH(D) A POS    Antibody Screen NEG    Sample Expiration 07/29/2017    Unit Number [id number]    Blood Component Type RED CELLS,LR    Unit division 00    Status of Unit ISSUED,FINAL    Transfusion Status OK TO TRANSFUSE    Crossmatch Result COMPATIBLE    Unit Number [id number]    Blood Component Type RED CELLS,LR    Unit division 00    Status of Unit ISSUED,FINAL    Transfusion Status OK TO TRANSFUSE    Crossmatch Result COMPATIBLE   Prepare RBC     Status: None   Collection Time: 07/29/17 10:36 AM  Result Value Ref Range   Order Confirmation ORDER PROCESSED BY BLOOD BANK   HIV 1 RNA quant-no reflex-bld     Status: None   Collection Time: 07/29/17  1:29 PM  Result Value Ref Range   HIV 1 RNA Quant 849,000  copies/mL    Comment: (NOTE) The reportable range for this assay is 20 to 10,000,000 copies HIV-1 RNA/mL.    LOG10 HIV-1 RNA 5.929 log10copy/mL    Comment: (NOTE) Performed At: [facility] [place], [place] [id number] Lindon Romp MD GQ:[id number]   Vitamin B12     Status: Abnormal   Collection Time: 07/29/17  1:29 PM  Result Value Ref Range   Vitamin B-12 2,842 (H) 180 - 914 pg/mL    Comment: (NOTE) This assay is not validated for testing neonatal or myeloproliferative syndrome specimens for Vitamin B12 levels. Performed at [facility], [place] [address] [place], [place] [address]   Folate     Status: Abnormal   Collection Time: 07/29/17  1:29 PM  Result Value Ref Range   Folate 5.2 (L) >5.9 ng/mL    Comment: Performed at [facility], [place] [address], [place], [place] [address]  Iron and TIBC     Status:  Abnormal   Collection Time: 07/29/17  1:29 PM  Result Value Ref Range   Iron 20 (L) 28 - 170 ug/dL   TIBC 196 (L) 250 - 450 ug/dL   Saturation Ratios 10 (L) 10.4 - 31.8 %   UIBC 176 ug/dL    Comment: Performed at [facility] [address], [place], [place] [address]  Ferritin     Status: Abnormal   Collection Time: 07/29/17  1:29 PM  Result Value Ref Range   Ferritin 1,140 (H) 11 - 307 ng/mL    Comment: Performed at [facility], [place] [address], [place], [place] [address]  Reticulocytes     Status: Abnormal   Collection Time: 07/29/17  1:29 PM  Result Value Ref Range   Retic Ct Pct <0.4 (L) 0.4 - 3.1 %   RBC. 2.45 (L) 3.87 - 5.11 MIL/uL   Retic Count, Absolute NOT CALCULATED 19.0 - 186.0 K/uL  Urinalysis, Routine w reflex microscopic     Status: Abnormal   Collection Time: 07/29/17  1:57 PM  Result Value Ref Range   Color, Urine YELLOW YELLOW   APPearance HAZY (A) CLEAR   Specific Gravity, Urine 1.009 1.005 - 1.030   pH 6.0 5.0 - 8.0   Glucose, UA NEGATIVE NEGATIVE mg/dL   Hgb urine dipstick LARGE (A) NEGATIVE    Bilirubin Urine NEGATIVE NEGATIVE   Ketones, ur 5 (A) NEGATIVE mg/dL   Protein, ur 100 (A) NEGATIVE mg/dL   Nitrite NEGATIVE NEGATIVE   Leukocytes, UA LARGE (A) NEGATIVE   RBC / HPF TOO NUMEROUS TO COUNT 0 - 5 RBC/hpf   WBC, UA TOO NUMEROUS TO COUNT 0 - 5 WBC/hpf   Bacteria, UA FEW (A) NONE SEEN   Squamous Epithelial / LPF 0-5 (A) NONE SEEN  CBC     Status: Abnormal   Collection Time: 07/30/17  8:32 AM  Result Value Ref Range   WBC 0.8 (LL) 4.0 - 10.5 K/uL    Comment: CRITICAL VALUE NOTED.  VALUE IS CONSISTENT WITH PREVIOUSLY REPORTED AND CALLED VALUE.   RBC 4.15 3.87 - 5.11 MIL/uL   Hemoglobin 9.9 (L) 12.0 - 15.0 g/dL    Comment: DELTA CHECK NOTED POST TRANSFUSION SPECIMEN    HCT 29.3 (L) 36.0 - 46.0 %   MCV 70.6 (L) 78.0 - 100.0 fL    Comment: DELTA CHECK NOTED POST TRANSFUSION SPECIMEN    MCH 23.9 (L) 26.0 - 34.0 pg   MCHC 33.8 30.0 - 36.0 g/dL   RDW 21.4 (H) 11.5 - 15.5 %   Platelets 137 (L) 150 - 400 K/uL  Comprehensive metabolic panel     Status: Abnormal   Collection Time: 07/30/17  8:32 AM  Result Value Ref Range   Sodium 136 135 - 145 mmol/L   Potassium 3.2 (L) 3.5 - 5.1 mmol/L   Chloride 107 101 - 111 mmol/L   CO2 22 22 - 32 mmol/L   Glucose, Bld 101 (H) 65 - 99 mg/dL   BUN 28 (H) 6 - 20 mg/dL   Creatinine, Ser 1.81 (H) 0.44 - 1.00 mg/dL   Calcium 8.1 (L) 8.9 - 10.3 mg/dL   Total Protein 5.8 (L) 6.5 - 8.1 g/dL   Albumin 2.6 (L) 3.5 - 5.0 g/dL   AST 70 (H) 15 - 41 U/L   ALT 33 14 - 54 U/L   Alkaline Phosphatase 161 (H) 38 - 126 U/L   Total Bilirubin 1.3 (H) 0.3 - 1.2 mg/dL  GFR calc non Af Amer 34 (L) >60 mL/min   GFR calc Af Amer 39 (L) >60 mL/min    Comment: (NOTE) The eGFR has been calculated using the CKD EPI equation. This calculation has not been validated in all clinical situations. eGFR's persistently <60 mL/min signify possible Chronic Kidney Disease.    Anion gap 7 5 - 15  Lactic acid, plasma     Status: Abnormal   Collection Time:  07/30/17  4:30 PM  Result Value Ref Range   Lactic Acid, Venous 2.3 (HH) 0.5 - 1.9 mmol/L    Comment: CRITICAL RESULT CALLED TO, READ BACK BY AND VERIFIED WITH: SCOTT,D @ 1738 ON [id number] BY POTEAT,S   Occult blood card to lab, stool     Status: None   Collection Time: 07/30/17  5:18 PM  Result Value Ref Range   Fecal Occult Bld NEGATIVE NEGATIVE    Dg Chest 2 View  Result Date: 07/29/2017 CLINICAL DATA:  Shortness of breath.  Hypoxia. EXAM: CHEST  2 VIEW COMPARISON:  12/05/2014 . FINDINGS: Mediastinum hilar structures normal. Lungs are clear. No pleural effusion or pneumothorax. Nipple shadows again noted. IMPRESSION: No active cardiopulmonary disease. Electronically Signed   By: Marcello Moores  Register   On: 07/29/2017 10:26   US Renal  Result Date: 07/30/2017 CLINICAL DATA:  Acute renal injury EXAM: RENAL / URINARY TRACT ULTRASOUND COMPLETE COMPARISON:  None. FINDINGS: Right Kidney: Length: 12.3 cm. Echogenicity within normal limits. No mass or hydronephrosis visualized. Left Kidney: Length: 11.9 cm. Echogenicity within normal limits. No mass or hydronephrosis visualized. Bladder: Appears normal for degree of bladder distention. Dependent debris layering in bladder. IMPRESSION: 1. Normal kidneys. 2. Dependent debris layering in the bladder. Electronically Signed   By: Suzy Bouchard M.D.   On: 07/30/2017 13:43   Nm Hepato W/eject Fract  Result Date: 07/30/2017 CLINICAL DATA:  Hervey Ard right upper quadrant pain and nausea for 2 months. EXAM: NUCLEAR MEDICINE HEPATOBILIARY IMAGING WITH GALLBLADDER EF TECHNIQUE: Sequential images of the abdomen were obtained out to 60 minutes following intravenous administration of radiopharmaceutical. After oral ingestion of Ensure, gallbladder ejection fraction was determined. At 60 min, normal ejection fraction is greater than 33%. RADIOPHARMACEUTICALS:  5.2 mCi Tc-49m Choletec IV COMPARISON:  Right upper quadrant ultrasound dated July 29, 2017. FINDINGS: Prompt  uptake and biliary excretion of activity by the liver is seen. Gallbladder activity is visualized, consistent with patency of cystic duct. Biliary activity passes into small bowel, consistent with patent common bile duct. Calculated gallbladder ejection fraction is 9.2%. (Normal gallbladder ejection fraction with Ensure is greater than 33%.) IMPRESSION: 1. Reduced gallbladder ejection fraction, measuring 9.2%. 2. Patent cystic and common bile ducts. Electronically Signed   By: WTitus DubinM.D.   On: 07/30/2017 16:08   Dg Chest Port 1 View  Result Date: 07/30/2017 CLINICAL DATA:  Sepsis and shortness of Breath EXAM: PORTABLE CHEST 1 VIEW COMPARISON:  07/29/2017 FINDINGS: The heart size and mediastinal contours are within normal limits. Both lungs are clear. The visualized skeletal structures are unremarkable. IMPRESSION: No active disease. Electronically Signed   By: MInez CatalinaM.D.   On: 07/30/2017 17:55   UKoreaAbdomen Limited Ruq  Result Date: 07/29/2017 CLINICAL DATA:  Abdominal pain for 2 months. EXAM: ULTRASOUND ABDOMEN LIMITED RIGHT UPPER QUADRANT COMPARISON:  None. FINDINGS: Gallbladder: No gallstones or wall thickening visualized. No sonographic Murphy sign noted by sonographer. Common bile duct: Diameter: 2.6 mm, normal. Liver: No focal lesion identified. Within normal limits in parenchymal echogenicity.  IMPRESSION: Normal exam. Electronically Signed   By: Lorriane Shire M.D.   On: 07/29/2017 10:12    Review of Systems  Constitutional: Positive for chills, fever, malaise/fatigue and weight loss.  Respiratory: Positive for shortness of breath.   Cardiovascular: Negative for chest pain.  Gastrointestinal: Positive for abdominal pain, constipation and vomiting.  Genitourinary: Negative.   Musculoskeletal: Negative.    Blood pressure 114/75, pulse 80, temperature 100.1 F (37.8 C), temperature source Oral, resp. rate 15, height 6' (1.829 m), weight 53.1 kg (117 lb), last menstrual period  07/22/2017, SpO2 97 %. Physical Exam General: Alert, pleasant African-American female, in no distress Skin: Warm and dry without rash or infection. HEENT: No palpable masses or thyromegaly. Sclera nonicteric. Pupils equal round and reactive.  Lymph nodes: No cervical, supraclavicular, or inguinal nodes palpable. Lungs: Breath sounds clear and equal without increased work of breathing Cardiovascular: Regular rate and rhythm without murmur. No JVD or edema.  Abdomen: Nondistended. There is localized moderate right upper quadrant tenderness without guarding or peritoneal signs. No masses palpable. No organomegaly. No palpable hernias. Extremities: No edema or joint swelling or deformity. No chronic venous stasis changes. Neurologic: Alert and fully oriented. Affect normal. No gross motor deficits..   Assessment/Plan: Persistent right upper quadrant abdominal pain, postprandial, associated with occasional vomiting suspicious for biliary tract disease. Her gallbladder ultrasound is normal without stones or evidence of cholecystitis. On HIDA her gallbladder fills ruling out acute cholecystitis but has a low ejection fraction of 9%. This certainly is consistent with biliary dyskinesia. As I discussed with the patient this workup is not conclusive and ejection fraction can be low secondary to other ongoing illnesses. Her symptoms however are very suggestive and I think she would have some reasonable chance of symptom relief with cholecystectomy although it may not be more than 50%. As severe as her symptoms are and ongoing she would like to pursue cholecystectomy in an effort to relieve her symptoms and I think this is reasonable.  However, she has significant comorbidities with now severe neutropenia secondary to HIV and fever. Her gallbladder is certainly not a source of infection or acute illness based on normal ultrasound and filling on HIDA. Surgery would be for symptom management only. I think she  would be at significant increased risk for infectious complications and healing problems in her current situation and I think her neutropenia and possible sepsis needs to be treated and stabilized prior to recommending surgery. I will discuss this with the medical team. We will follow.  Aunica Dauphinee T 07/30/2017, 7:06 PM

## 2017-07-30 NOTE — Progress Notes (Signed)
Patient not seen today as she was in [place] but HIDA with EF is abnormal. She has biliary dyskinesia, and this would explain her RUQ pain. Would get a surgical consult tomorrow. Will sign off.

## 2017-07-31 DIAGNOSIS — R1310 Dysphagia, unspecified: Secondary | ICD-10-CM

## 2017-07-31 DIAGNOSIS — Z79899 Other long term (current) drug therapy: Secondary | ICD-10-CM

## 2017-07-31 DIAGNOSIS — D696 Thrombocytopenia, unspecified: Secondary | ICD-10-CM

## 2017-07-31 LAB — COMPREHENSIVE METABOLIC PANEL
ALBUMIN: 2.1 g/dL — AB (ref 3.5–5.0)
ALT: 33 U/L (ref 14–54)
AST: 91 U/L — AB (ref 15–41)
Alkaline Phosphatase: 195 U/L — ABNORMAL HIGH (ref 38–126)
Anion gap: 8 (ref 5–15)
BUN: 25 mg/dL — ABNORMAL HIGH (ref 6–20)
CHLORIDE: 113 mmol/L — AB (ref 101–111)
CO2: 16 mmol/L — ABNORMAL LOW (ref 22–32)
Calcium: 7.4 mg/dL — ABNORMAL LOW (ref 8.9–10.3)
Creatinine, Ser: 1.69 mg/dL — ABNORMAL HIGH (ref 0.44–1.00)
GFR calc Af Amer: 43 mL/min — ABNORMAL LOW (ref >60)
GFR calc non Af Amer: 37 mL/min — ABNORMAL LOW (ref >60)
Glucose, Bld: 94 mg/dL (ref 65–99)
POTASSIUM: 2.9 mmol/L — AB (ref 3.5–5.1)
SODIUM: 137 mmol/L (ref 135–145)
TOTAL PROTEIN: 4.7 g/dL — AB (ref 6.5–8.1)
Total Bilirubin: 1.2 mg/dL (ref 0.3–1.2)

## 2017-07-31 LAB — BASIC METABOLIC PANEL
Anion gap: 10 (ref 5–15)
BUN: 26 mg/dL — AB (ref 6–20)
CALCIUM: 7.7 mg/dL — AB (ref 8.9–10.3)
CO2: 16 mmol/L — ABNORMAL LOW (ref 22–32)
Chloride: 111 mmol/L (ref 101–111)
Creatinine, Ser: 1.89 mg/dL — ABNORMAL HIGH (ref 0.44–1.00)
GFR calc Af Amer: 37 mL/min — ABNORMAL LOW (ref >60)
GFR, EST NON AFRICAN AMERICAN: 32 mL/min — AB (ref >60)
GLUCOSE: 93 mg/dL (ref 65–99)
POTASSIUM: 3.2 mmol/L — AB (ref 3.5–5.1)
SODIUM: 137 mmol/L (ref 135–145)

## 2017-07-31 LAB — MICROALBUMIN / CREATININE URINE RATIO
Creatinine, Urine: 93.4 mg/dL
MICROALB UR: 173.1 ug/mL — AB
MICROALB/CREAT RATIO: 185.3 mg/g{creat} — AB (ref 0.0–30.0)

## 2017-07-31 LAB — HEPATIC FUNCTION PANEL
ALT: 36 U/L (ref 14–54)
AST: 98 U/L — AB (ref 15–41)
Albumin: 2.2 g/dL — ABNORMAL LOW (ref 3.5–5.0)
Alkaline Phosphatase: 190 U/L — ABNORMAL HIGH (ref 38–126)
BILIRUBIN DIRECT: 0.4 mg/dL (ref 0.1–0.5)
Indirect Bilirubin: 0.6 mg/dL (ref 0.3–0.9)
TOTAL PROTEIN: 5 g/dL — AB (ref 6.5–8.1)
Total Bilirubin: 1 mg/dL (ref 0.3–1.2)

## 2017-07-31 MED ORDER — AZITHROMYCIN 600 MG PO TABS
1200.0000 mg | ORAL_TABLET | ORAL | Status: DC
  Administered 2017-07-31: 1200 mg via ORAL
  Filled 2017-07-31: qty 2

## 2017-07-31 MED ORDER — SODIUM CHLORIDE 0.9 % IV SOLN: INTRAVENOUS | Status: DC

## 2017-07-31 MED ORDER — POTASSIUM CHLORIDE IN NACL 20-0.9 MEQ/L-% IV SOLN
INTRAVENOUS | Status: DC
  Filled 2017-07-31: qty 1000

## 2017-07-31 MED ORDER — FLUCONAZOLE IN SODIUM CHLORIDE 400-0.9 MG/200ML-% IV SOLN
400.0000 mg | INTRAVENOUS | Status: DC
  Administered 2017-08-01 – 2017-08-02 (×2): 400 mg via INTRAVENOUS
  Filled 2017-07-31 (×3): qty 200

## 2017-07-31 MED ORDER — SODIUM CHLORIDE 0.9 % IV BOLUS (SEPSIS)
1000.0000 mL | Freq: Once | INTRAVENOUS | Status: AC
  Administered 2017-07-31: 1000 mL via INTRAVENOUS

## 2017-07-31 MED ORDER — SODIUM CHLORIDE 0.45 % IV SOLN
INTRAVENOUS | Status: DC
  Administered 2017-07-31: 09:00:00 via INTRAVENOUS
  Filled 2017-07-31: qty 1000

## 2017-07-31 MED ORDER — PANTOPRAZOLE SODIUM 40 MG PO TBEC
40.0000 mg | DELAYED_RELEASE_TABLET | Freq: Two times a day (BID) | ORAL | Status: DC
  Administered 2017-07-31 – 2017-08-05 (×10): 40 mg via ORAL
  Filled 2017-07-31 (×10): qty 1

## 2017-07-31 MED ORDER — SULFAMETHOXAZOLE-TRIMETHOPRIM 800-160 MG PO TABS
1.0000 | ORAL_TABLET | ORAL | Status: DC
  Administered 2017-07-31: 1 via ORAL
  Filled 2017-07-31: qty 1

## 2017-07-31 MED ORDER — SUCRALFATE 1 GM/10ML PO SUSP
1.0000 g | Freq: Three times a day (TID) | ORAL | Status: DC
  Administered 2017-07-31 – 2017-08-05 (×19): 1 g via ORAL
  Filled 2017-07-31 (×19): qty 10

## 2017-07-31 MED ORDER — DIPHENHYDRAMINE HCL 25 MG PO CAPS
25.0000 mg | ORAL_CAPSULE | Freq: Four times a day (QID) | ORAL | Status: DC | PRN
  Administered 2017-07-31: 25 mg via ORAL
  Filled 2017-07-31: qty 1

## 2017-07-31 MED ORDER — POTASSIUM CHLORIDE IN NACL 20-0.45 MEQ/L-% IV SOLN
INTRAVENOUS | Status: DC
  Administered 2017-07-31: 100 mL/h via INTRAVENOUS
  Filled 2017-07-31 (×3): qty 1000

## 2017-07-31 MED ORDER — FLUCONAZOLE IN SODIUM CHLORIDE 200-0.9 MG/100ML-% IV SOLN
200.0000 mg | INTRAVENOUS | Status: DC
  Administered 2017-07-31: 200 mg via INTRAVENOUS
  Filled 2017-07-31: qty 100

## 2017-07-31 MED ORDER — OXYCODONE HCL 5 MG PO TABS
5.0000 mg | ORAL_TABLET | ORAL | Status: DC | PRN
  Administered 2017-07-31 – 2017-08-05 (×17): 5 mg via ORAL
  Filled 2017-07-31 (×19): qty 1

## 2017-07-31 NOTE — Progress Notes (Addendum)
[facility] PROGRESS NOTE    Progress Note  [name]  QZE:[id number] DOB: 12-Dec-1976 DOA: 07/29/2017 PCP: Nolene Ebbs, MD     Brief Narrative:   [name] is an 41 y.o. female past medical history of HIV noncompliant, dysphagia due to Candida, microcytic anemia hepatitis B bipolar disorder who presents to the was a long ED with chief complaint of right upper quadrant pain for 2 months associated with emesis and weight loss, she reports that her pain started on 04/30/2017 after her EGD with esophageal dilation for which she did not complete her Diflucan treatment. Since then she's been having dysphasia worse with solid foods and liquids made better with heating pads meloxicam.  Assessment/Plan:   Acute on chronic Right upper quadrant pain associated with emesis: - Right upper quadrant ultrasound and chest x-ray showed no acute findings. Her bilirubin started to trend up, alkaline phosphatase is high along with mild elevation of LFT's.. 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 - We'll start her on empiric antibiotics,  consulted general surgery, that relates low likelyhood of biliry disease.  Sepsis/Neutropenic fever: - She was fluid resuscitated with improvement in her vitals and lactate, she is about 4 L positive. - Will continue IV fluid hydration. Monitor strict I's and O's.No JVD or crackles on physical exam. - Urine cultures are pending. - Will continue IV vancomycin and Zosyn. - Cultures are negative till date. - Most likely sources gallbladder, skin is intact, mouth & dentition idoes not appears to be the source of infection. Is not complaining of diarrhea. cough or shortness of breath, no urinary symptoms.  HIV with leukopenia and medication noncompliance: - CD4 count is less than 50 will start Bactrim and azithromycin, for Pneumocystis jirovecii and Mycobacterium prophylaxis - Continue home genvoya. - Will  consult ID.  Esophageal strictures and stenosis associated with dysphagia in the setting of Candida esophagitis: She is noncompliant with her medication, continue IV Diflucan. No further dysphagia.  Microcytic anemia: Hemoglobin usually ranges around 8.7-9, on admission it was 6.0. Status post 2 units of packed red blood cells, anemia panel is unreliable as ferritin is acting as an acute phase reactant. Check a CBC on 08/01/2017.  GERD: She was not taking her Protonix, Zyprexa and [name] due to nausea vomiting and abdominal pain.  Hypokalemia: Likely due to emesis. Replete orally, recheck a basic metabolic panel in the morning.  Acute kidney injury: Baseline creatinine less than 1 In the setting of sepsis we'll continue aggressive IV fluid hydration. No JVD and physical exam. Renal ultrasound showed no acute findings.  History of asthma: Continue current home medications.  Bipolar disorder: Resume home medications.  New Non-anion gap metabolic acidosis: Possibly due to renal disease. Change IV fluids to half-normal saline.  Severe protein caloric malnutrition: Malnutrition Type:  Nutrition Problem: Malnutrition (Severe) Etiology: chronic illness (HIV)   Malnutrition Characteristics:  Signs/Symptoms: energy intake < or equal to 50% for > or equal to 1 month, severe depletion of body fat, severe depletion of muscle mass   Nutrition Interventions:  Interventions: Boost Breeze, Prostat   DVT prophylaxis: SCD Family Communication:none Disposition Plan/Barrier to D/C: unable to detemrine Code Status:     Code Status Orders        Start     Ordered   07/29/17 0846  Full code  Continuous     07/29/17 0846    Code Status History    Date Active Date Inactive Code Status Order ID Comments  User Context   10/23/2016 10:55 PM 10/28/2016  6:40 PM Full Code [id number]  Edwin Dada, MD Inpatient   12/06/2014  1:13 AM 12/12/2014  5:25 PM Full Code [id number]   Waldemar Dickens, MD Inpatient   08/10/2014  1:58 PM 08/20/2014  5:10 PM Full Code [id number]  Theodis Blaze, MD Inpatient   12/27/2013 10:14 PM 12/29/2013  4:57 PM Full Code [id number]  Annita Brod, MD Inpatient   07/09/2013  4:31 PM 07/16/2013  1:54 PM Full Code [id number]  Theodis Blaze, MD Inpatient   03/05/2012  5:35 PM 03/08/2012  6:56 PM Full Code [id number]  Delora Fuel, MD ED        IV Access:    Peripheral IV   Procedures and diagnostic studies:   Dg Chest 2 View  Result Date: 07/29/2017 CLINICAL DATA:  Shortness of breath.  Hypoxia. EXAM: CHEST  2 VIEW COMPARISON:  12/05/2014 . FINDINGS: Mediastinum hilar structures normal. Lungs are clear. No pleural effusion or pneumothorax. Nipple shadows again noted. IMPRESSION: No active cardiopulmonary disease. Electronically Signed   By: Marcello Moores  Register   On: 07/29/2017 10:26   US Renal  Result Date: 07/30/2017 CLINICAL DATA:  Acute renal injury EXAM: RENAL / URINARY TRACT ULTRASOUND COMPLETE COMPARISON:  None. FINDINGS: Right Kidney: Length: 12.3 cm. Echogenicity within normal limits. No mass or hydronephrosis visualized. Left Kidney: Length: 11.9 cm. Echogenicity within normal limits. No mass or hydronephrosis visualized. Bladder: Appears normal for degree of bladder distention. Dependent debris layering in bladder. IMPRESSION: 1. Normal kidneys. 2. Dependent debris layering in the bladder. Electronically Signed   By: Suzy Bouchard M.D.   On: 07/30/2017 13:43   Nm Hepato W/eject Fract  Result Date: 07/30/2017 CLINICAL DATA:  Hervey Ard right upper quadrant pain and nausea for 2 months. EXAM: NUCLEAR MEDICINE HEPATOBILIARY IMAGING WITH GALLBLADDER EF TECHNIQUE: Sequential images of the abdomen were obtained out to 60 minutes following intravenous administration of radiopharmaceutical. After oral ingestion of Ensure, gallbladder ejection fraction was determined. At 60 min, normal ejection fraction is greater than 33%. RADIOPHARMACEUTICALS:   5.2 mCi Tc-74m  Choletec IV COMPARISON:  Right upper quadrant ultrasound dated July 29, 2017. FINDINGS: Prompt uptake and biliary excretion of activity by the liver is seen. Gallbladder activity is visualized, consistent with patency of cystic duct. Biliary activity passes into small bowel, consistent with patent common bile duct. Calculated gallbladder ejection fraction is 9.2%. (Normal gallbladder ejection fraction with Ensure is greater than 33%.) IMPRESSION: 1. Reduced gallbladder ejection fraction, measuring 9.2%. 2. Patent cystic and common bile ducts. Electronically Signed   By: Titus Dubin M.D.   On: 07/30/2017 16:08   Dg Chest Port 1 View  Result Date: 07/30/2017 CLINICAL DATA:  Sepsis and shortness of Breath EXAM: PORTABLE CHEST 1 VIEW COMPARISON:  07/29/2017 FINDINGS: The heart size and mediastinal contours are within normal limits. Both lungs are clear. The visualized skeletal structures are unremarkable. IMPRESSION: No active disease. Electronically Signed   By: Inez Catalina M.D.   On: 07/30/2017 17:55   US Abdomen Limited Ruq  Result Date: 07/29/2017 CLINICAL DATA:  Abdominal pain for 2 months. EXAM: ULTRASOUND ABDOMEN LIMITED RIGHT UPPER QUADRANT COMPARISON:  None. FINDINGS: Gallbladder: No gallstones or wall thickening visualized. No sonographic Murphy sign noted by sonographer. Common bile duct: Diameter: 2.6 mm, normal. Liver: No focal lesion identified. Within normal limits in parenchymal echogenicity. IMPRESSION: Normal exam. Electronically Signed   By: Lorriane Shire M.D.   On: 07/29/2017 10:12  Medical Consultants:    None.  Anti-Infectives:     Subjective:    [place] she continues to have abdominal pain.  Objective:    Vitals:   07/30/17 1653 07/30/17 1825 07/30/17 2043 07/31/17 0445  BP: 114/70 114/75 97/64 105/64  Pulse: 79 80 89 79  Resp: 15  13 16   Temp: 100.3 F (37.9 C) 100.1 F (37.8 C) (!) 100.5 F (38.1 C) 98.8 F (37.1 C)    TempSrc: Oral Oral Oral Oral  SpO2:   99% 100%  Weight:    61.2 kg (135 lb)  Height:        Intake/Output Summary (Last 24 hours) at 07/31/17 0806 Last data filed at 07/31/17 0459  Gross per 24 hour  Intake          2471.25 ml  Output              800 ml  Net          1671.25 ml   Filed Weights   07/29/17 1034 07/31/17 0445  Weight: 53.1 kg (117 lb) 61.2 kg (135 lb)    Exam: General exam: In no acute distress, Cachectic Respiratory system: Good air movement and clear to auscultation. Cardiovascular system: Regular rate and rhythm with positive S1-S2 no JVD. Gastrointestinal system: Abdomen is soft with right upper quadrant and epigastric tenderness no rebound or guarding. Central nervous system: Alert and oriented 3, nonfocal. Extremities: No lower extremity edema Skin: No rashes, lesions or ulcers Psychiatry: Judgment and insight appeared normal.   Data Reviewed:    Labs: Basic Metabolic Panel:  Recent Labs Lab 07/29/17 0520 07/30/17 0832 07/31/17 0005  NA 137 136 137  K 3.0* 3.2* 2.9*  CL 100* 107 113*  CO2 22 22 16*  GLUCOSE 138* 101* 94  BUN 30* 28* 25*  CREATININE 1.62* 1.81* 1.69*  CALCIUM 8.8* 8.1* 7.4*   GFR Estimated Creatinine Clearance: 42.8 mL/min (A) (by C-G formula based on SCr of 1.69 mg/dL (H)). Liver Function Tests:  Recent Labs Lab 07/29/17 0520 07/30/17 0832 07/31/17 0005  AST 120* 70* 91*  ALT 52 33 33  ALKPHOS 203* 161* 195*  BILITOT 1.2 1.3* 1.2  PROT 6.9 5.8* 4.7*  ALBUMIN 3.1* 2.6* 2.1*    Recent Labs Lab 07/29/17 0520  LIPASE 30   No results for input(s): AMMONIA in the last 168 hours. Coagulation profile  Recent Labs Lab 07/30/17 1914  INR 1.11    CBC:  Recent Labs Lab 07/29/17 0520 07/30/17 0832 07/30/17 1914  WBC 1.2* 0.8* 1.2*  NEUTROABS 0.9*  --  1.0*  HGB 6.0* 9.9* 7.7*  HCT 18.5* 29.3* 22.2*  MCV 62.5* 70.6* 70.3*  PLT 205 137* 97*   Cardiac Enzymes: No results for input(s): CKTOTAL,  CKMB, CKMBINDEX, TROPONINI in the last 168 hours. BNP (last 3 results) No results for input(s): PROBNP in the last 8760 hours. CBG: No results for input(s): GLUCAP in the last 168 hours. D-Dimer: No results for input(s): DDIMER in the last 72 hours. Hgb A1c: No results for input(s): HGBA1C in the last 72 hours. Lipid Profile: No results for input(s): CHOL, HDL, LDLCALC, TRIG, CHOLHDL, LDLDIRECT in the last 72 hours. Thyroid function studies: No results for input(s): TSH, T4TOTAL, T3FREE, THYROIDAB in the last 72 hours.  Invalid input(s): FREET3 Anemia work up:  Recent Labs  07/29/17 1329  VITAMINB12 2,842*  FOLATE 5.2*  FERRITIN 1,140*  TIBC 196*  IRON 20*  RETICCTPCT <0.4*   Sepsis  Labs:  Recent Labs Lab 07/29/17 0520 07/30/17 0832 07/30/17 1630 07/30/17 1914 07/30/17 2200  PROCALCITON  --   --   --  1.00  --   WBC 1.2* 0.8*  --  1.2*  --   LATICACIDVEN  --   --  2.3* 0.8 0.5   Microbiology No results found for this or any previous visit (from the past 240 hour(s)).   Medications:   . acetaminophen  325 mg Oral Once  . benztropine  1 mg Oral Daily  . elvitegravir-cobicistat-emtricitabine-tenofovir  1 tablet Oral Q breakfast  . feeding supplement  1 Container Oral TID BM  . feeding supplement (PRO-STAT SUGAR FREE 64)  30 mL Oral BID  . OLANZapine  5 mg Oral QHS  . pantoprazole  40 mg Oral Daily  . senna-docusate  2 tablet Oral BID  . sucralfate  1 g Oral TID WC & HS   Continuous Infusions: . fluconazole (DIFLUCAN) IV Stopped (07/30/17 1542)  . piperacillin-tazobactam (ZOSYN)  IV 3.375 g (07/31/17 0454)  . sodium chloride 0.45 % with kcl    . vancomycin        LOS: 1 day   Charlynne Cousins  [facility] Pager [phone]  *Please refer to [email or web address], password [email or web address] to get updated schedule on who will round on this patient, as hospitalists switch teams weekly. If 7PM-7AM, please contact night-coverage at [email or web address], password [email or web address] for any  overnight needs.  07/31/2017, 8:06 AM

## 2017-07-31 NOTE — Progress Notes (Signed)
Nutrition Follow-up  DOCUMENTATION CODES:   Severe malnutrition in context of chronic illness, Underweight  INTERVENTION:   30 ml Prostat BID  Boost Breeze po TID, each supplement provides 250 kcal and 9 grams of protein  NUTRITION DIAGNOSIS:   Malnutrition (Severe) related to chronic illness (HIV) as evidenced by energy intake < or equal to 50% for > or equal to 1 month, severe depletion of body fat, severe depletion of muscle mass.  Ongoing  GOAL:   Patient will meet greater than or equal to 90% of their needs  Not meeting  MONITOR:   PO intake, Supplement acceptance, Labs, Weight trends  REASON FOR ASSESSMENT:   Consult Assessment of nutrition requirement/status  ASSESSMENT:   Pt with PMH of Hep B, HIV, medication non-compliance, pyelonephritis, thyroid disease, and acute psychosis. Presents this admission with right upper abdominal pain.   Pt eating upon RD follow up. Reports having a decrease in appetite this current admission due to continuing abdominal pain. States her pain gets worse with eating/drinking sugary options. Pt trying to drink Boost Breeze but complains of abdominal pain upon consumption. Pt noted to be opening sugar packets for her hot tea, stating "this sugar doesn't hurt because the liquid is warm." RD stressed the importance of protein intake for malnutrition while in the hospital. Explained that ProStat contains lower amounts of sugar and will be a good source of protein while on clear liquid diet. Weight trending up since beginning of admission. Will continue to monitor supplement acceptance and PO intake.   Medications reviewed and include: IV abx, NS with KCl @ 50 ml/hr Labs reviewed: K 3.2 (L) CO2 16 (L) BUN 26 (H) Creatinine 1.89 (H) Calcium 7.7 (L) Albumin 2.1 (L) AST 91 (H) ALP 195 (H)  Diet Order:  Diet clear liquid Room service appropriate? Yes; Fluid consistency: Thin  Skin:   (open wound vagina)  Last BM:  07/30/17  Height:   Ht  Readings from Last 1 Encounters:  07/29/17 6' (1.829 m)    Weight:   Wt Readings from Last 1 Encounters:  07/31/17 135 lb (61.2 kg)    Ideal Body Weight:  72.7 kg  BMI:  Body mass index is 18.31 kg/m.  Estimated Nutritional Needs:   Kcal:  1800-2000 (34-38 kcal/kg)  Protein:  100-110 grams (1.9-2.1 g/kg)  Fluid:  >1.8 L/day  EDUCATION NEEDS:   Education needs addressed  [place], LDN Clinical Nutrition Pager # [phone]

## 2017-07-31 NOTE — Progress Notes (Signed)
Nutrition Brief Note  RD consulted for assessment of nutrition requirements. Please refer to RD note from 07/29/2017.   [name] RD, LDN Clinical Nutrition Pager # [phone]

## 2017-07-31 NOTE — Progress Notes (Signed)
Pt was given Zithromax and Bactrim. The pills looked very similar. Patient took two of the three pills and the third pill she had a hard time swallowing and coughed it back up. Was not sure which pill she was unable to swallow. Will not readministration since I am not sure which pill she has taken.

## 2017-07-31 NOTE — Progress Notes (Signed)
[facility] Progress Note     Subjective: CC: RUQ pain Pain is present in the RUQ and radiates to epigastrum. Feels improved from yesterday. Had some loose BMs yesterday after drinking ensure. Denies nausea. Coughed up some whitish sputum earlier, having trouble with swallowing larger pills.  Notes swelling in her feet and left hand.   Objective: Vital signs in last 24 hours: Temp:  [98.8 F (37.1 C)-100.5 F (38.1 C)] 98.8 F (37.1 C) (08/16 0445) Pulse Rate:  [79-107] 79 (08/16 0445) Resp:  [13-20] 16 (08/16 0445) BP: (91-114)/(51-75) 105/64 (08/16 0445) SpO2:  [99 %-100 %] 100 % (08/16 0445) Weight:  [61.2 kg (135 lb)] 61.2 kg (135 lb) (08/16 0445) Last BM Date: 07/30/17  Intake/Output from previous day: 08/15 0701 - 08/16 0700 In: 2471.3 [P.O.:240; I.V.:1681.3; IV Piggyback:550] Out: 800 [Urine:800] Intake/Output this shift: Total I/O In: 100 [P.O.:100] Out: -   PE: Gen:  Alert, NAD, pleasant Card:  Regular rate and rhythm, pedal pulses 2+ BL Pulm:  Normal effort, clear to auscultation bilaterally Abd: Soft, non-tender, mildly distended, bowel sounds present in all 4 quadrants, no HSM, +Murphy's sign  Skin: dry, no rash Psych: A&Ox3   Lab Results:   Recent Labs  07/30/17 0832 07/30/17 1914  WBC 0.8* 1.2*  HGB 9.9* 7.7*  HCT 29.3* 22.2*  PLT 137* 97*   BMET  Recent Labs  07/31/17 0005 07/31/17 0810  NA 137 137  K 2.9* 3.2*  CL 113* 111  CO2 16* 16*  GLUCOSE 94 93  BUN 25* 26*  CREATININE 1.69* 1.89*  CALCIUM 7.4* 7.7*   PT/INR  Recent Labs  07/30/17 1914  LABPROT 14.3  INR 1.11   CMP     Component Value Date/Time   NA 137 07/31/2017 0810   K 3.2 (L) 07/31/2017 0810   CL 111 07/31/2017 0810   CO2 16 (L) 07/31/2017 0810   GLUCOSE 93 07/31/2017 0810   GLUCOSE 93 06/11/2010   BUN 26 (H) 07/31/2017 0810   CREATININE 1.89 (H) 07/31/2017 0810   CREATININE 0.66 11/12/2016 1702   CALCIUM 7.7 (L) 07/31/2017 0810   PROT 4.7  (L) 07/31/2017 0005   ALBUMIN 2.1 (L) 07/31/2017 0005   AST 91 (H) 07/31/2017 0005   ALT 33 07/31/2017 0005   ALKPHOS 195 (H) 07/31/2017 0005   BILITOT 1.2 07/31/2017 0005   GFRNONAA 32 (L) 07/31/2017 0810   GFRAA 37 (L) 07/31/2017 0810   Lipase     Component Value Date/Time   LIPASE 30 07/29/2017 0520       Studies/Results: Dg Chest 2 View  Result Date: 07/29/2017 CLINICAL DATA:  Shortness of breath.  Hypoxia. EXAM: CHEST  2 VIEW COMPARISON:  12/05/2014 . FINDINGS: Mediastinum hilar structures normal. Lungs are clear. No pleural effusion or pneumothorax. Nipple shadows again noted. IMPRESSION: No active cardiopulmonary disease. Electronically Signed   By: Marcello Moores  Register   On: 07/29/2017 10:26   US Renal  Result Date: 07/30/2017 CLINICAL DATA:  Acute renal injury EXAM: RENAL / URINARY TRACT ULTRASOUND COMPLETE COMPARISON:  None. FINDINGS: Right Kidney: Length: 12.3 cm. Echogenicity within normal limits. No mass or hydronephrosis visualized. Left Kidney: Length: 11.9 cm. Echogenicity within normal limits. No mass or hydronephrosis visualized. Bladder: Appears normal for degree of bladder distention. Dependent debris layering in bladder. IMPRESSION: 1. Normal kidneys. 2. Dependent debris layering in the bladder. Electronically Signed   By: Suzy Bouchard M.D.   On: 07/30/2017 13:43   Nm Hepato W/eject Fract  Result Date: 07/30/2017 CLINICAL DATA:  Hervey Ard right upper quadrant pain and nausea for 2 months. EXAM: NUCLEAR MEDICINE HEPATOBILIARY IMAGING WITH GALLBLADDER EF TECHNIQUE: Sequential images of the abdomen were obtained out to 60 minutes following intravenous administration of radiopharmaceutical. After oral ingestion of Ensure, gallbladder ejection fraction was determined. At 60 min, normal ejection fraction is greater than 33%. RADIOPHARMACEUTICALS:  5.2 mCi Tc-52m Choletec IV COMPARISON:  Right upper quadrant ultrasound dated July 29, 2017. FINDINGS: Prompt uptake and biliary  excretion of activity by the liver is seen. Gallbladder activity is visualized, consistent with patency of cystic duct. Biliary activity passes into small bowel, consistent with patent common bile duct. Calculated gallbladder ejection fraction is 9.2%. (Normal gallbladder ejection fraction with Ensure is greater than 33%.) IMPRESSION: 1. Reduced gallbladder ejection fraction, measuring 9.2%. 2. Patent cystic and common bile ducts. Electronically Signed   By: WTitus DubinM.D.   On: 07/30/2017 16:08   Dg Chest Port 1 View  Result Date: 07/30/2017 CLINICAL DATA:  Sepsis and shortness of Breath EXAM: PORTABLE CHEST 1 VIEW COMPARISON:  07/29/2017 FINDINGS: The heart size and mediastinal contours are within normal limits. Both lungs are clear. The visualized skeletal structures are unremarkable. IMPRESSION: No active disease. Electronically Signed   By: MInez CatalinaM.D.   On: 07/30/2017 17:55   [place]Abdomen Limited Ruq  Result Date: 07/29/2017 CLINICAL DATA:  Abdominal pain for 2 months. EXAM: ULTRASOUND ABDOMEN LIMITED RIGHT UPPER QUADRANT COMPARISON:  None. FINDINGS: Gallbladder: No gallstones or wall thickening visualized. No sonographic Murphy sign noted by sonographer. Common bile duct: Diameter: 2.6 mm, normal. Liver: No focal lesion identified. Within normal limits in parenchymal echogenicity. IMPRESSION: Normal exam. Electronically Signed   By: JLorriane ShireM.D.   On: 07/29/2017 10:12    Anti-infectives: Anti-infectives    Start     Dose/Rate Route Frequency Ordered Stop   07/31/17 1700  vancomycin (VANCOCIN) IVPB 750 mg/150 ml premix     750 mg 150 mL/hr over 60 Minutes Intravenous Every 24 hours 07/30/17 1738     07/31/17 1000  fluconazole (DIFLUCAN) IVPB 200 mg     200 mg 100 mL/hr over 60 Minutes Intravenous Every 24 hours 07/31/17 0838     07/30/17 2200  piperacillin-tazobactam (ZOSYN) IVPB 3.375 g     3.375 g 12.5 mL/hr over 240 Minutes Intravenous Every 8 hours 07/30/17 1558      07/30/17 1730  piperacillin-tazobactam (ZOSYN) IVPB 3.375 g  Status:  Discontinued     3.375 g 100 mL/hr over 30 Minutes Intravenous  Once 07/30/17 1728 07/30/17 1729   07/30/17 1730  vancomycin (VANCOCIN) IVPB 1000 mg/200 mL premix     1,000 mg 200 mL/hr over 60 Minutes Intravenous  Once 07/30/17 1728 07/30/17 1918   07/30/17 1600  piperacillin-tazobactam (ZOSYN) IVPB 3.375 g     3.375 g 100 mL/hr over 30 Minutes Intravenous STAT 07/30/17 1556 07/30/17 1726   07/30/17 1000  fluconazole (DIFLUCAN) IVPB 100 mg  Status:  Discontinued     100 mg 50 mL/hr over 60 Minutes Intravenous Every 24 hours 07/30/17 0603 07/30/17 0605   07/30/17 1000  fluconazole (DIFLUCAN) IVPB 100 mg  Status:  Discontinued     100 mg 50 mL/hr over 60 Minutes Intravenous Every 24 hours 07/30/17 0604 07/31/17 0838   07/29/17 1200  elvitegravir-cobicistat-emtricitabine-tenofovir (GENVOYA) 150-150-200-10 MG tablet 1 tablet     1 tablet Oral Daily with breakfast 07/29/17 0933     07/29/17 1000  fluconazole (DIFLUCAN)  IVPB 100 mg  Status:  Discontinued     100 mg 50 mL/hr over 60 Minutes Intravenous Every 24 hours 07/29/17 0847 07/30/17 0603       Assessment/Plan RUQ pain - HIDA shows reduced EF - likely has biliary dyskinesia - No cholelithiasis, pericholecystic fluid or GB wall thickening on [place] - not suggestive of acute cholecystitis  - Started on empiric abx by medicine service - Tbili 1.0, AST 98, ALT 36, Alk Phos 190 Sepsis/Neutropenic fever - management per primary service - improvement in vitals and lactate with IVF - IV zosyn and vanc - urine cx pending; blood cx negative HIV with leukopenia and medication non-compliance - ID to see Esophageal strictures and stenosis with dysphagia in setting of Candida esophagitis Microcytic Anemia - Hgb 7.7 yesterday GERD Hypokalemia - 3.2, getting PO replacement  AKI - Cr up to 1.89 from 1.69 Hx of asthma Bipolar disorder Metabolic acidosis Severe Protein calorie  malnutrition  FEN - clears VTE - SCDs ID - On IV zosyn (8/15>>) and Vanc (8/15>>), PO azithromycin (8/16), PO bactrim (T, TR, Sat), Diflucan to start tomorrow; Genvoya (8/14>>)  Plan: severe neutropenia. Gallbladder not likely to be the source of her infection/acute illness. Need to stabilize infectious disease processes prior to consideration for cholecystectomy. Will continue to follow.    LOS: 1 day    Brigid Re , [facility] 07/31/2017, 9:29 AM Pager: [phone] Consults: [phone] Mon-Fri 7:00 am-4:30 pm Sat-Sun 7:00 am-11:30 am

## 2017-07-31 NOTE — Consult Note (Signed)
[facility]  Total days of antibiotics 3       Reason for Consult: hiv disease    Referring Physician: Aileen Fass  Active Problems:   HIV disease ([place])   Microcytic anemia   Bipolar disorder (HCC)   Dysphagia   Asthma   GERD (gastroesophageal reflux disease)   Candidal esophagitis (HCC)   Severe protein-calorie malnutrition (HCC)   Abdominal pain   Acute renal failure (ARF) (HCC)   Leukopenia   Neutropenia with fever (HCC)   Acalculous cholecystitis   Bacteremia due to other bacteria   Neutropenic fever (HCC)   Acute kidney injury (nontraumatic) (HCC)   AKI (acute kidney injury) ([place])   Thrombocytopenia (HCC)    HPI: Paityn I Badilla is a 41 y.o. female with hiv disease/AIDS with CD 4 coutn of 10(5%)/VL 849,000, on genvoya, bipolar, hx of esophageal candidiasis, with strictures requiring dilatation. She was admitted for 2 month history of right upper quadrant pain worsening in the last few days prior to admit with radiating to her back. She also has increasing dysphagia. She has been off of her HIV meds > a month.  Now afebrile, feeling slightly better. Still having abdominal pain, localizing RUQ  Past Medical History:  Diagnosis Date  . Acute psychosis 03/10/12   2nd admission in last wk for this  . Angio-edema   . Anxiety   . Asthma    inhaler 2xday  . Bipolar disorder ([place])   . Depression   . GERD (gastroesophageal reflux disease)   . Hepatitis B    /E-chart  . History of blood transfusion   . HIV positive ([place])   . Microcytic anemia    h/o per E-chart  . Noncompliance with medication regimen    /e-chart  . 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 Pyelonephritis    h/o per E-chart  . Shortness of breath dyspnea    due to Asthma  . Thyroid disease   . Urticaria     Allergies:  Allergies  Allergen Reactions  . Dapsone Itching and Rash  . Other Hives and Itching    Berries, TREE  NUTS  . Peanut-Containing Drug Products Hives  . Shellfish Allergy Hives  . Bactrim Itching  . Orange Fruit Itching  . Sulfa Antibiotics Itching   MEDICATIONS: . azithromycin  1,200 mg Oral Q Thu  . benztropine  1 mg Oral Daily  . elvitegravir-cobicistat-emtricitabine-tenofovir  1 tablet Oral Q breakfast  . feeding supplement  1 Container Oral TID BM  . feeding supplement (PRO-STAT SUGAR FREE 64)  30 mL Oral BID  . OLANZapine  5 mg Oral QHS  . pantoprazole  40 mg Oral BID  . senna-docusate  2 tablet Oral BID  . sucralfate  1 g Oral TID WC & HS  . sulfamethoxazole-trimethoprim  1 tablet Oral Once per day on Tue Thu Sat    Social History  Substance Use Topics  . Smoking status: Never Smoker  . Smokeless tobacco: Never Used  . Alcohol use No    Family History  Problem Relation Age of Onset  . Cancer Mother   . Diabetes Mother   . Diabetes Father   . Heart disease Father   . Diabetes Sister   . Urticaria Sister   . Asthma Son   . Allergic rhinitis Neg Hx   . Eczema Neg Hx   . Immunodeficiency Neg Hx    Review of Systems  Constitutional: positive for fever, chills, diaphoresis, activity change, appetite change, fatigue and unexpected weight change.  HENT: Negative for congestion, sore throat, rhinorrhea, sneezing, trouble swallowing and sinus pressure.  Eyes: Negative for photophobia and visual disturbance.  Respiratory: Negative for cough, chest tightness, shortness of breath, wheezing and stridor.  Cardiovascular: Negative for chest pain, palpitations and leg swelling.  Gastrointestinal: positive for nausea, vomiting, abdominal pain, diarrhea, constipation, blood in stool, abdominal distention and anal bleeding.  Genitourinary: Negative for dysuria, hematuria, flank pain and difficulty urinating.  Musculoskeletal: Negative for myalgias, back pain, joint swelling, arthralgias and gait problem.  Skin: Negative for color change, pallor, rash and wound.  Neurological:  Negative for dizziness, tremors, weakness and light-headedness.  Hematological: Negative for adenopathy. Does not bruise/bleed easily.  Psychiatric/Behavioral: Negative for behavioral problems, confusion, sleep disturbance, dysphoric mood, decreased concentration and agitation.     OBJECTIVE: Temp:  [98.8 F (37.1 C)-100.5 F (38.1 C)] 98.8 F (37.1 C) (08/16 0445) Pulse Rate:  [71-89] 71 (08/16 1542) Resp:  [13-18] 18 (08/16 1542) BP: (97-125)/(64-85) 125/85 (08/16 1542) SpO2:  [99 %-100 %] 100 % (08/16 1542) Weight:  [135 lb (61.2 kg)] 135 lb (61.2 kg) (08/16 0445) Physical Exam  Constitutional:  oriented to person, place, and time. appears chronically ill and malnourished. No distress.  HENT: [place]/AT, PERRLA, no scleral icterus Mouth/Throat: Oropharynx is clear and moist. + oropharyngeal exudate.  Cardiovascular: Normal rate, regular rhythm and normal heart sounds. Exam reveals no gallop and no friction rub.  No murmur heard.  Pulmonary/Chest: Effort normal and breath sounds normal. No respiratory distress.  has no wheezes.  Neck = supple, no nuchal rigidity Abdominal: Soft. Bowel sounds are normal.  exhibits no distension. There is no tenderness.  Lymphadenopathy: no cervical adenopathy. No axillary adenopathy Neurological: alert and oriented to person, place, and time.  Skin: Skin is warm and dry. Dermatitis to face. Psychiatric: a normal mood and affect.  behavior is normal.   LABS: Results for orders placed or performed during the hospital encounter of 07/29/17 (from the past 48 hour(s))  CBC     Status: Abnormal   Collection Time: 07/30/17  8:32 AM  Result Value Ref Range   WBC 0.8 (LL) 4.0 - 10.5 K/uL    Comment: CRITICAL VALUE NOTED.  VALUE IS CONSISTENT WITH PREVIOUSLY REPORTED AND CALLED VALUE.   RBC 4.15 3.87 - 5.11 MIL/uL   Hemoglobin 9.9 (L) 12.0 - 15.0 g/dL    Comment: DELTA CHECK NOTED POST TRANSFUSION SPECIMEN    HCT 29.3 (L) 36.0 - 46.0 %   MCV 70.6 (L)  78.0 - 100.0 fL    Comment: DELTA CHECK NOTED POST TRANSFUSION SPECIMEN    MCH 23.9 (L) 26.0 - 34.0 pg   MCHC 33.8 30.0 - 36.0 g/dL   RDW 21.4 (H) 11.5 - 15.5 %   Platelets 137 (L) 150 - 400 K/uL  Comprehensive metabolic panel     Status: Abnormal   Collection Time: 07/30/17  8:32 AM  Result Value Ref Range   Sodium 136 135 - 145 mmol/L   Potassium 3.2 (L) 3.5 - 5.1 mmol/L   Chloride 107 101 - 111 mmol/L   CO2 22 22 - 32 mmol/L   Glucose, Bld 101 (H) 65 - 99 mg/dL   BUN 28 (H) 6 - 20 mg/dL   Creatinine, Ser 1.81 (H) 0.44 - 1.00 mg/dL   Calcium 8.1 (L) 8.9 - 10.3 mg/dL   Total Protein 5.8 (L) 6.5 - 8.1 g/dL  Albumin 2.6 (L) 3.5 - 5.0 g/dL   AST 70 (H) 15 - 41 U/L   ALT 33 14 - 54 U/L   Alkaline Phosphatase 161 (H) 38 - 126 U/L   Total Bilirubin 1.3 (H) 0.3 - 1.2 mg/dL   GFR calc non Af Amer 34 (L) >60 mL/min   GFR calc Af Amer 39 (L) >60 mL/min    Comment: (NOTE) The eGFR has been calculated using the CKD EPI equation. This calculation has not been validated in all clinical situations. eGFR's persistently <60 mL/min signify possible Chronic Kidney Disease.    Anion gap 7 5 - 15  Lactic acid, plasma     Status: Abnormal   Collection Time: 07/30/17  4:30 PM  Result Value Ref Range   Lactic Acid, Venous 2.3 (HH) 0.5 - 1.9 mmol/L    Comment: CRITICAL RESULT CALLED TO, READ BACK BY AND VERIFIED WITH: SCOTT,D @ 1738 ON [id number] BY POTEAT,S   Occult blood card to lab, stool     Status: None   Collection Time: 07/30/17  5:18 PM  Result Value Ref Range   Fecal Occult Bld NEGATIVE NEGATIVE  CBC with Differential     Status: Abnormal   Collection Time: 07/30/17  7:14 PM  Result Value Ref Range   WBC 1.2 (LL) 4.0 - 10.5 K/uL    Comment: REPEATED TO VERIFY CRITICAL RESULT CALLED TO, READ BACK BY AND VERIFIED WITH: JACKSON,V. RN _0  ON 08.15.18 BY COHEN,K    RBC 3.16 (L) 3.87 - 5.11 MIL/uL   Hemoglobin 7.7 (L) 12.0 - 15.0 g/dL    Comment: DELTA CHECK NOTED REPEATED TO  VERIFY    HCT 22.2 (L) 36.0 - 46.0 %   MCV 70.3 (L) 78.0 - 100.0 fL   MCH 24.4 (L) 26.0 - 34.0 pg   MCHC 34.7 30.0 - 36.0 g/dL   RDW 21.5 (H) 11.5 - 15.5 %   Platelets 97 (L) 150 - 400 K/uL   Neutrophils Relative % 77 %   Lymphocytes Relative 12 %   Monocytes Relative 10 %   Eosinophils Relative 0 %   Basophils Relative 1 %   Neutro Abs 1.0 (L) 1.7 - 7.7 K/uL   Lymphs Abs 0.1 (L) 0.7 - 4.0 K/uL   Monocytes Absolute 0.1 0.1 - 1.0 K/uL   Eosinophils Absolute 0.0 0.0 - 0.7 K/uL   Basophils Absolute 0.0 0.0 - 0.1 K/uL   RBC Morphology ELLIPTOCYTES     Comment: SPHEROCYTES   WBC Morphology WHITE COUNT CONFIRMED ON SMEAR    Smear Review PLATELET COUNT CONFIRMED BY SMEAR   Lactic acid, plasma     Status: None   Collection Time: 07/30/17  7:14 PM  Result Value Ref Range   Lactic Acid, Venous 0.8 0.5 - 1.9 mmol/L  Procalcitonin     Status: None   Collection Time: 07/30/17  7:14 PM  Result Value Ref Range   Procalcitonin 1.00 ng/mL    Comment:        Interpretation: PCT > 0.5 ng/mL and <= 2 ng/mL: Systemic infection (sepsis) is possible, but other conditions are known to elevate PCT as well. (NOTE)         ICU PCT Algorithm               Non ICU PCT Algorithm    ----------------------------     ------------------------------         PCT < 0.25 ng/mL  PCT < 0.1 ng/mL     Stopping of antibiotics            Stopping of antibiotics       strongly encouraged.               strongly encouraged.    ----------------------------     ------------------------------       PCT level decrease by               PCT < 0.25 ng/mL       >= 80% from peak PCT       OR PCT 0.25 - 0.5 ng/mL          Stopping of antibiotics                                             encouraged.     Stopping of antibiotics           encouraged.    ----------------------------     ------------------------------       PCT level decrease by              PCT >= 0.25 ng/mL       < 80% from peak PCT         AND PCT >= 0.5 ng/mL             Continuing antibiotics                                              encouraged.       Continuing antibiotics            encouraged.    ----------------------------     ------------------------------     PCT level increase compared          PCT > 0.5 ng/mL         with peak PCT AND          PCT >= 0.5 ng/mL             Escalation of antibiotics                                          strongly encouraged.      Escalation of antibiotics        strongly encouraged.   Protime-INR     Status: None   Collection Time: 07/30/17  7:14 PM  Result Value Ref Range   Prothrombin Time 14.3 11.4 - 15.2 seconds   INR 1.11   APTT     Status: None   Collection Time: 07/30/17  7:14 PM  Result Value Ref Range   aPTT 32 24 - 36 seconds  Lactic acid, plasma     Status: None   Collection Time: 07/30/17 10:00 PM  Result Value Ref Range   Lactic Acid, Venous 0.5 0.5 - 1.9 mmol/L  Comprehensive metabolic panel     Status: Abnormal   Collection Time: 07/31/17 12:05 AM  Result Value Ref Range   Sodium 137 135 - 145 mmol/L   Potassium 2.9 (L) 3.5 - 5.1 mmol/L   Chloride 113 (H) 101 - 111 mmol/L  CO2 16 (L) 22 - 32 mmol/L   Glucose, Bld 94 65 - 99 mg/dL   BUN 25 (H) 6 - 20 mg/dL   Creatinine, Ser 1.69 (H) 0.44 - 1.00 mg/dL   Calcium 7.4 (L) 8.9 - 10.3 mg/dL   Total Protein 4.7 (L) 6.5 - 8.1 g/dL   Albumin 2.1 (L) 3.5 - 5.0 g/dL   AST 91 (H) 15 - 41 U/L   ALT 33 14 - 54 U/L   Alkaline Phosphatase 195 (H) 38 - 126 U/L   Total Bilirubin 1.2 0.3 - 1.2 mg/dL   GFR calc non Af Amer 37 (L) >60 mL/min   GFR calc Af Amer 43 (L) >60 mL/min    Comment: (NOTE) The eGFR has been calculated using the CKD EPI equation. This calculation has not been validated in all clinical situations. eGFR's persistently <60 mL/min signify possible Chronic Kidney Disease.    Anion gap 8 5 - 15  Basic metabolic panel     Status: Abnormal   Collection Time: 07/31/17  8:10 AM  Result Value  Ref Range   Sodium 137 135 - 145 mmol/L   Potassium 3.2 (L) 3.5 - 5.1 mmol/L   Chloride 111 101 - 111 mmol/L   CO2 16 (L) 22 - 32 mmol/L   Glucose, Bld 93 65 - 99 mg/dL   BUN 26 (H) 6 - 20 mg/dL   Creatinine, Ser 1.89 (H) 0.44 - 1.00 mg/dL   Calcium 7.7 (L) 8.9 - 10.3 mg/dL   GFR calc non Af Amer 32 (L) >60 mL/min   GFR calc Af Amer 37 (L) >60 mL/min    Comment: (NOTE) The eGFR has been calculated using the CKD EPI equation. This calculation has not been validated in all clinical situations. eGFR's persistently <60 mL/min signify possible Chronic Kidney Disease.    Anion gap 10 5 - 15  Hepatic function panel     Status: Abnormal   Collection Time: 07/31/17  8:10 AM  Result Value Ref Range   Total Protein 5.0 (L) 6.5 - 8.1 g/dL   Albumin 2.2 (L) 3.5 - 5.0 g/dL   AST 98 (H) 15 - 41 U/L   ALT 36 14 - 54 U/L   Alkaline Phosphatase 190 (H) 38 - 126 U/L   Total Bilirubin 1.0 0.3 - 1.2 mg/dL   Bilirubin, Direct 0.4 0.1 - 0.5 mg/dL   Indirect Bilirubin 0.6 0.3 - 0.9 mg/dL    MICRO: 8/15 blood cx ngtd IMAGING: US Renal  Result Date: 07/30/2017 CLINICAL DATA:  Acute renal injury EXAM: RENAL / URINARY TRACT ULTRASOUND COMPLETE COMPARISON:  None. FINDINGS: Right Kidney: Length: 12.3 cm. Echogenicity within normal limits. No mass or hydronephrosis visualized. Left Kidney: Length: 11.9 cm. Echogenicity within normal limits. No mass or hydronephrosis visualized. Bladder: Appears normal for degree of bladder distention. Dependent debris layering in bladder. IMPRESSION: 1. Normal kidneys. 2. Dependent debris layering in the bladder. Electronically Signed   By: Suzy Bouchard M.D.   On: 07/30/2017 13:43   Nm Hepato W/eject Fract  Result Date: 07/30/2017 CLINICAL DATA:  Hervey Ard right upper quadrant pain and nausea for 2 months. EXAM: NUCLEAR MEDICINE HEPATOBILIARY IMAGING WITH GALLBLADDER EF TECHNIQUE: Sequential images of the abdomen were obtained out to 60 minutes following intravenous  administration of radiopharmaceutical. After oral ingestion of Ensure, gallbladder ejection fraction was determined. At 60 min, normal ejection fraction is greater than 33%. RADIOPHARMACEUTICALS:  5.2 mCi Tc-25m Choletec IV COMPARISON:  Right upper quadrant ultrasound dated  July 29, 2017. FINDINGS: Prompt uptake and biliary excretion of activity by the liver is seen. Gallbladder activity is visualized, consistent with patency of cystic duct. Biliary activity passes into small bowel, consistent with patent common bile duct. Calculated gallbladder ejection fraction is 9.2%. (Normal gallbladder ejection fraction with Ensure is greater than 33%.) IMPRESSION: 1. Reduced gallbladder ejection fraction, measuring 9.2%. 2. Patent cystic and common bile ducts. Electronically Signed   By: Titus Dubin M.D.   On: 07/30/2017 16:08   Dg Chest Port 1 View  Result Date: 07/30/2017 CLINICAL DATA:  Sepsis and shortness of Breath EXAM: PORTABLE CHEST 1 VIEW COMPARISON:  07/29/2017 FINDINGS: The heart size and mediastinal contours are within normal limits. Both lungs are clear. The visualized skeletal structures are unremarkable. IMPRESSION: No active disease. Electronically Signed   By: Inez Catalina M.D.   On: 07/30/2017 17:55   Assessment/Plan:  Mrs Chavero is a 41 yo F with advanced hiv disease, poorly controlled with candidal esophagitis, admitted for RUQ pain concerning for cholecysitits but found to have reduced GB ejection fraction on HIDA scan. May have history of dysmotility issues.  - hiv disease = restart on genvoya  oi proph = continue with azithromycin weekly and bactrim ds daily  Candidal esophagitis = recommend to fluconazole 480m IV to see if any improvement  Fevers = recommend to get afb blood cx concerning for MAC. May consider empiric treatment if no improvement with antifungals

## 2017-08-01 DIAGNOSIS — T68XXXA Hypothermia, initial encounter: Secondary | ICD-10-CM

## 2017-08-01 LAB — CBC
HCT: 23.5 % — ABNORMAL LOW (ref 36.0–46.0)
HEMOGLOBIN: 8 g/dL — AB (ref 12.0–15.0)
MCH: 24 pg — ABNORMAL LOW (ref 26.0–34.0)
MCHC: 34 g/dL (ref 30.0–36.0)
MCV: 70.4 fL — ABNORMAL LOW (ref 78.0–100.0)
PLATELETS: 98 10*3/uL — AB (ref 150–400)
RBC: 3.34 MIL/uL — AB (ref 3.87–5.11)
RDW: 22.7 % — ABNORMAL HIGH (ref 11.5–15.5)
WBC: 0.8 10*3/uL — AB (ref 4.0–10.5)

## 2017-08-01 LAB — CBC WITH DIFFERENTIAL/PLATELET
BASOS ABS: 0 10*3/uL (ref 0.0–0.1)
Basophils Relative: 1 %
EOS ABS: 0 10*3/uL (ref 0.0–0.7)
Eosinophils Relative: 0 %
HCT: 22.2 % — ABNORMAL LOW (ref 36.0–46.0)
Hemoglobin: 7.7 g/dL — ABNORMAL LOW (ref 12.0–15.0)
LYMPHS ABS: 0.1 10*3/uL — AB (ref 0.7–4.0)
LYMPHS PCT: 12 %
MCH: 24.4 pg — AB (ref 26.0–34.0)
MCHC: 34.7 g/dL (ref 30.0–36.0)
MCV: 70.3 fL — AB (ref 78.0–100.0)
MONOS PCT: 10 %
Monocytes Absolute: 0.1 10*3/uL (ref 0.1–1.0)
Neutro Abs: 1 10*3/uL — ABNORMAL LOW (ref 1.7–7.7)
Neutrophils Relative %: 77 %
PLATELETS: 97 10*3/uL — AB (ref 150–400)
RBC: 3.16 MIL/uL — AB (ref 3.87–5.11)
RDW: 21.5 % — AB (ref 11.5–15.5)
WBC: 1.2 10*3/uL — AB (ref 4.0–10.5)

## 2017-08-01 LAB — TSH: TSH: 6.389 u[IU]/mL — AB (ref 0.350–4.500)

## 2017-08-01 LAB — BASIC METABOLIC PANEL
ANION GAP: 8 (ref 5–15)
BUN: 27 mg/dL — ABNORMAL HIGH (ref 6–20)
CALCIUM: 7.7 mg/dL — AB (ref 8.9–10.3)
CO2: 16 mmol/L — ABNORMAL LOW (ref 22–32)
CREATININE: 1.72 mg/dL — AB (ref 0.44–1.00)
Chloride: 109 mmol/L (ref 101–111)
GFR calc non Af Amer: 36 mL/min — ABNORMAL LOW (ref >60)
GFR, EST AFRICAN AMERICAN: 42 mL/min — AB (ref >60)
Glucose, Bld: 105 mg/dL — ABNORMAL HIGH (ref 65–99)
Potassium: 3.5 mmol/L (ref 3.5–5.1)
SODIUM: 133 mmol/L — AB (ref 135–145)

## 2017-08-01 LAB — CORTISOL: CORTISOL PLASMA: 7.1 ug/dL

## 2017-08-01 MED ORDER — SODIUM CHLORIDE 0.45 % IV SOLN
INTRAVENOUS | Status: DC
  Administered 2017-08-01: 13:00:00 via INTRAVENOUS
  Filled 2017-08-01 (×4): qty 1000

## 2017-08-01 MED ORDER — SULFAMETHOXAZOLE-TRIMETHOPRIM 800-160 MG PO TABS
1.0000 | ORAL_TABLET | ORAL | Status: DC
  Administered 2017-08-04: 1 via ORAL
  Filled 2017-08-01 (×3): qty 1

## 2017-08-01 MED ORDER — AZITHROMYCIN 600 MG PO TABS
1200.0000 mg | ORAL_TABLET | ORAL | Status: DC
  Administered 2017-08-01: 1200 mg via ORAL
  Filled 2017-08-01 (×4): qty 2

## 2017-08-01 MED ORDER — SODIUM CHLORIDE 0.9 % IV BOLUS (SEPSIS)
1000.0000 mL | Freq: Once | INTRAVENOUS | Status: AC
  Administered 2017-08-01: 1000 mL via INTRAVENOUS

## 2017-08-01 NOTE — Progress Notes (Signed)
[facility]/Trauma Progress Note      Assessment/Plan RUQ pain - HIDA shows reduced EF - likely has biliary dyskinesia - No cholelithiasis, pericholecystic fluid or GB wall thickening on [place] - not suggestive of acute cholecystitis  - Started on empiric abx by medicine service - Tbili 1.0, AST 98, ALT 36, Alk Phos 190 Sepsis/Neutropenic fever - management per primary service - IV zosyn and vanc HIV with leukopenia and medication non-compliance - ID following Esophageal strictures and stenosis with dysphagia in setting of Candida esophagitis Microcytic Anemia  GERD Hypokalemia - 3.2, getting PO replacement  AKI  Hx of asthma Bipolar disorder Metabolic acidosis Severe Protein calorie malnutrition  FEN - clears VTE - SCDs ID - On IV zosyn (8/15>>) and Vanc (8/15>>), PO azithromycin (8/16), PO bactrim (T, TR, Sat), Diflucan (8/16>>) Genvoya (8/14>>)  Plan: severe neutropenia. Gallbladder not likely to be the source of her infection/acute illness. Need to stabilize infectious disease processes prior to consideration for cholecystectomy. Will continue to follow.    LOS: 2 days    Subjective:  CC: abdominal pain  Still with intermittent pain without nausea or vomiting.   Objective: Vital signs in last 24 hours: Temp:  [94.9 F (34.9 C)-99 F (37.2 C)] 97.5 F (36.4 C) (08/17 1400) Pulse Rate:  [53-71] 53 (08/17 0607) Resp:  [12-18] 12 (08/17 0607) BP: (112-128)/(77-88) 112/77 (08/17 0607) SpO2:  [100 %] 100 % (08/17 0607) Last BM Date: 07/30/17  Intake/Output from previous day: 08/16 0701 - 08/17 0700 In: 3200 [P.O.:1300; I.V.:1600; IV Piggyback:300] Out: -  Intake/Output this shift: No intake/output data recorded.  PE: Gen:  Alert, NAD, cooperative Pulm: rate and effort normal Abd: Soft, not distended, +BS, mild TTP in RUQ, no guarding   Anti-infectives: Anti-infectives    Start     Dose/Rate Route Frequency Ordered Stop   08/01/17 1400   azithromycin (ZITHROMAX) tablet 1,200 mg     1,200 mg Oral Every Fri 08/01/17 1131     08/01/17 1300  sulfamethoxazole-trimethoprim (BACTRIM DS,SEPTRA DS) 800-160 MG per tablet 1 tablet     1 tablet Oral Once per day on Mon Wed Fri 08/01/17 1131     08/01/17 1000  fluconazole (DIFLUCAN) IVPB 400 mg     400 mg 100 mL/hr over 120 Minutes Intravenous Every 24 hours 07/31/17 1138     07/31/17 1700  vancomycin (VANCOCIN) IVPB 750 mg/150 ml premix     750 mg 150 mL/hr over 60 Minutes Intravenous Every 24 hours 07/30/17 1738     07/31/17 1400  azithromycin (ZITHROMAX) tablet 1,200 mg  Status:  Discontinued     1,200 mg Oral Every Thu 07/31/17 1036 08/01/17 1131   07/31/17 1200  sulfamethoxazole-trimethoprim (BACTRIM DS,SEPTRA DS) 800-160 MG per tablet 1 tablet  Status:  Discontinued     1 tablet Oral Once per day on Tue Thu Sat 07/31/17 1036 08/01/17 1131   07/31/17 1000  fluconazole (DIFLUCAN) IVPB 200 mg  Status:  Discontinued     200 mg 100 mL/hr over 60 Minutes Intravenous Every 24 hours 07/31/17 0838 07/31/17 1138   07/30/17 2200  piperacillin-tazobactam (ZOSYN) IVPB 3.375 g     3.375 g 12.5 mL/hr over 240 Minutes Intravenous Every 8 hours 07/30/17 1558     07/30/17 1730  piperacillin-tazobactam (ZOSYN) IVPB 3.375 g  Status:  Discontinued     3.375 g 100 mL/hr over 30 Minutes Intravenous  Once 07/30/17 1728 07/30/17 1729   07/30/17 1730  vancomycin (VANCOCIN) IVPB 1000  mg/200 mL premix     1,000 mg 200 mL/hr over 60 Minutes Intravenous  Once 07/30/17 1728 07/30/17 1918   07/30/17 1600  piperacillin-tazobactam (ZOSYN) IVPB 3.375 g     3.375 g 100 mL/hr over 30 Minutes Intravenous STAT 07/30/17 1556 07/30/17 1726   07/30/17 1000  fluconazole (DIFLUCAN) IVPB 100 mg  Status:  Discontinued     100 mg 50 mL/hr over 60 Minutes Intravenous Every 24 hours 07/30/17 0603 07/30/17 0605   07/30/17 1000  fluconazole (DIFLUCAN) IVPB 100 mg  Status:  Discontinued     100 mg 50 mL/hr over 60 Minutes  Intravenous Every 24 hours 07/30/17 0604 07/31/17 0838   07/29/17 1200  elvitegravir-cobicistat-emtricitabine-tenofovir (GENVOYA) 150-150-200-10 MG tablet 1 tablet     1 tablet Oral Daily with breakfast 07/29/17 0933     07/29/17 1000  fluconazole (DIFLUCAN) IVPB 100 mg  Status:  Discontinued     100 mg 50 mL/hr over 60 Minutes Intravenous Every 24 hours 07/29/17 0847 07/30/17 0603      Lab Results:   Recent Labs  07/30/17 1914 08/01/17 0718  WBC 1.2* 0.8*  HGB 7.7* 8.0*  HCT 22.2* 23.5*  PLT 97* 98*   BMET  Recent Labs  07/31/17 0810 08/01/17 0718  NA 137 133*  K 3.2* 3.5  CL 111 109  CO2 16* 16*  GLUCOSE 93 105*  BUN 26* 27*  CREATININE 1.89* 1.72*  CALCIUM 7.7* 7.7*   PT/INR  Recent Labs  07/30/17 1914  LABPROT 14.3  INR 1.11   CMP     Component Value Date/Time   NA 133 (L) 08/01/2017 0718   K 3.5 08/01/2017 0718   CL 109 08/01/2017 0718   CO2 16 (L) 08/01/2017 0718   GLUCOSE 105 (H) 08/01/2017 0718   GLUCOSE 93 06/11/2010   BUN 27 (H) 08/01/2017 0718   CREATININE 1.72 (H) 08/01/2017 0718   CREATININE 0.66 11/12/2016 1702   CALCIUM 7.7 (L) 08/01/2017 0718   PROT 5.0 (L) 07/31/2017 0810   ALBUMIN 2.2 (L) 07/31/2017 0810   AST 98 (H) 07/31/2017 0810   ALT 36 07/31/2017 0810   ALKPHOS 190 (H) 07/31/2017 0810   BILITOT 1.0 07/31/2017 0810   GFRNONAA 36 (L) 08/01/2017 0718   GFRAA 42 (L) 08/01/2017 0718   Lipase     Component Value Date/Time   LIPASE 30 07/29/2017 0520    Studies/Results: Nm Hepato W/eject Fract  Result Date: 07/30/2017 CLINICAL DATA:  Hervey Ard right upper quadrant pain and nausea for 2 months. EXAM: NUCLEAR MEDICINE HEPATOBILIARY IMAGING WITH GALLBLADDER EF TECHNIQUE: Sequential images of the abdomen were obtained out to 60 minutes following intravenous administration of radiopharmaceutical. After oral ingestion of Ensure, gallbladder ejection fraction was determined. At 60 min, normal ejection fraction is greater than 33%.  RADIOPHARMACEUTICALS:  5.2 mCi Tc-30m Choletec IV COMPARISON:  Right upper quadrant ultrasound dated July 29, 2017. FINDINGS: Prompt uptake and biliary excretion of activity by the liver is seen. Gallbladder activity is visualized, consistent with patency of cystic duct. Biliary activity passes into small bowel, consistent with patent common bile duct. Calculated gallbladder ejection fraction is 9.2%. (Normal gallbladder ejection fraction with Ensure is greater than 33%.) IMPRESSION: 1. Reduced gallbladder ejection fraction, measuring 9.2%. 2. Patent cystic and common bile ducts. Electronically Signed   By: WTitus DubinM.D.   On: 07/30/2017 16:08   Dg Chest Port 1 View  Result Date: 07/30/2017 CLINICAL DATA:  Sepsis and shortness of Breath EXAM: PORTABLE CHEST  1 VIEW COMPARISON:  07/29/2017 FINDINGS: The heart size and mediastinal contours are within normal limits. Both lungs are clear. The visualized skeletal structures are unremarkable. IMPRESSION: No active disease. Electronically Signed   By: Inez Catalina M.D.   On: 07/30/2017 17:55      Kalman Drape , [facility] 08/01/2017, 2:10 PM Pager: [phone] Consults: [phone] Mon-Fri 7:00 am-4:30 pm Sat-Sun 7:00 am-11:30 am

## 2017-08-01 NOTE — Progress Notes (Addendum)
[facility] PROGRESS NOTE    Progress Note  DENEICE WACK  IEP:[id number] DOB: 1976-06-26 DOA: 07/29/2017 PCP: Nolene Ebbs, MD     Brief Narrative:   [name] is an 41 y.o. female past medical history of HIV noncompliant, dysphagia due to Candida, microcytic anemia hepatitis B bipolar disorder who presents to the was a long ED with chief complaint of right upper quadrant pain for 2 months associated with emesis and weight loss, she reports that her pain started on 04/30/2017 after her EGD with esophageal dilation for which she did not complete her Diflucan treatment. Since then she's been having dysphasia worse with solid foods and liquids made better with heating pads meloxicam.  Assessment/Plan:   Acute on chronic Right upper quadrant pain associated with emesis: - Right upper quadrant ultrasound and chest x-ray showed no acute findings. Her bilirubin started to trend up, alkaline phosphatase is high along with mild elevation of LFT's.. - Hepatobiliary scan with reduced ejection fraction. - We'll start her on empiric antibiotics,  consulted general surgery, that relates low likelyhood of biliry disease.  Sepsis/Neutropenic fever: - She was fluid resuscitated with improvement in her vitals and lactate, she is about 4 L positive. - Will continue IV fluid hydration. Monitor strict I's and O's.No JVD or crackles on physical exam. - Urine cultures Are negative - ID was consulted recommended blood cultures and sputum cultures to rule out mag. - Her neutropenia continues to show between 1.2 and 0.8.  HIV with leukopenia and medication noncompliance: - CD4 count is less than 50 will start Bactrim and azithromycin, for Pneumocystis jirovecii and Mycobacterium prophylaxis - Continue home genvoya.  Esophageal strictures and stenosis associated with dysphagia in the setting of Candida esophagitis: She is noncompliant with her medication, fish disease recommended to increase  Diflucan.  Microcytic anemia: Hemoglobin usually ranges around 8.7-9, on admission it was 6.0. Status post 2 units of packed red blood cells, hemoglobin is stable.  GERD: She was not taking her Protonix, Zyprexa and [name] due to nausea vomiting and abdominal pain.  Hypokalemia: Likely due to emesis. Replete orally, recheck a basic metabolic panel in the morning.  Acute kidney injury: Baseline creatinine less than 1 In the setting of sepsis we'll continue aggressive IV fluid hydration. No JVD and physical exam. Renal ultrasound showed no acute findings.  History of asthma: Continue current home medications.  Bipolar disorder: Resume home medications.  New Non-anion gap metabolic acidosis: Possibly due to renal disease. Change her fluids to half-normal saline with bicarbonate.  Severe protein caloric malnutrition: Malnutrition Type:  Nutrition Problem: Malnutrition (Severe) Etiology: chronic illness (HIV)   Malnutrition Characteristics:  Signs/Symptoms: energy intake < or equal to 50% for > or equal to 1 month, severe depletion of body fat, severe depletion of muscle mass   Nutrition Interventions:  Interventions: Boost Breeze, Prostat  New Hypothermia: Vital signs are stable 5 blankets this was a rectal temperature will continue to monitor closely.  DVT prophylaxis: SCD Family Communication:none Disposition Plan/Barrier to D/C: unable to detemrine Code Status:     Code Status Orders        Start     Ordered   07/29/17 0846  Full code  Continuous     07/29/17 0846    Code Status History    Date Active Date Inactive Code Status Order ID Comments User Context   10/23/2016 10:55 PM 10/28/2016  6:40 PM Full Code [id number]  Edwin Dada, MD Inpatient   12/06/2014  1:13 AM 12/12/2014  5:25 PM Full Code [id number]  Waldemar Dickens, MD Inpatient   08/10/2014  1:58 PM 08/20/2014  5:10 PM Full Code [id number]  Theodis Blaze, MD Inpatient   12/27/2013  10:14 PM 12/29/2013  4:57 PM Full Code [id number]  Annita Brod, MD Inpatient   07/09/2013  4:31 PM 07/16/2013  1:54 PM Full Code [id number]  Theodis Blaze, MD Inpatient   03/05/2012  5:35 PM 03/08/2012  6:56 PM Full Code [id number]  Delora Fuel, MD ED        IV Access:    Peripheral IV   Procedures and diagnostic studies:   US Renal  Result Date: 07/30/2017 CLINICAL DATA:  Acute renal injury EXAM: RENAL / URINARY TRACT ULTRASOUND COMPLETE COMPARISON:  None. FINDINGS: Right Kidney: Length: 12.3 cm. Echogenicity within normal limits. No mass or hydronephrosis visualized. Left Kidney: Length: 11.9 cm. Echogenicity within normal limits. No mass or hydronephrosis visualized. Bladder: Appears normal for degree of bladder distention. Dependent debris layering in bladder. IMPRESSION: 1. Normal kidneys. 2. Dependent debris layering in the bladder. Electronically Signed   By: Suzy Bouchard M.D.   On: 07/30/2017 13:43   Nm Hepato W/eject Fract  Result Date: 07/30/2017 CLINICAL DATA:  Hervey Ard right upper quadrant pain and nausea for 2 months. EXAM: NUCLEAR MEDICINE HEPATOBILIARY IMAGING WITH GALLBLADDER EF TECHNIQUE: Sequential images of the abdomen were obtained out to 60 minutes following intravenous administration of radiopharmaceutical. After oral ingestion of Ensure, gallbladder ejection fraction was determined. At 60 min, normal ejection fraction is greater than 33%. RADIOPHARMACEUTICALS:  5.2 mCi Tc-63m  Choletec IV COMPARISON:  Right upper quadrant ultrasound dated July 29, 2017. FINDINGS: Prompt uptake and biliary excretion of activity by the liver is seen. Gallbladder activity is visualized, consistent with patency of cystic duct. Biliary activity passes into small bowel, consistent with patent common bile duct. Calculated gallbladder ejection fraction is 9.2%. (Normal gallbladder ejection fraction with Ensure is greater than 33%.) IMPRESSION: 1. Reduced gallbladder ejection fraction, measuring  9.2%. 2. Patent cystic and common bile ducts. Electronically Signed   By: Titus Dubin M.D.   On: 07/30/2017 16:08   Dg Chest Port 1 View  Result Date: 07/30/2017 CLINICAL DATA:  Sepsis and shortness of Breath EXAM: PORTABLE CHEST 1 VIEW COMPARISON:  07/29/2017 FINDINGS: The heart size and mediastinal contours are within normal limits. Both lungs are clear. The visualized skeletal structures are unremarkable. IMPRESSION: No active disease. Electronically Signed   By: Inez Catalina M.D.   On: 07/30/2017 17:55     Medical Consultants:    None.  Anti-Infectives:   IV vancomycin Zosyn oral Bactrim and azithromycin.  Subjective:    [name] she relates her pain is better.  Objective:    Vitals:   07/31/17 2015 07/31/17 2054 08/01/17 0607 08/01/17 0807  BP: 128/88  112/77   Pulse: 68  (!) 53   Resp:   12   Temp: 99 F (37.2 C)  (!) 94.9 F (34.9 C) (!) 95.5 F (35.3 C)  TempSrc: Oral  Rectal Rectal  SpO2: 100% 100% 100%   Weight:      Height:        Intake/Output Summary (Last 24 hours) at 08/01/17 0857 Last data filed at 08/01/17 0600  Gross per 24 hour  Intake             3200 ml  Output  0 ml  Net             3200 ml   Filed Weights   07/29/17 1034 07/31/17 0445  Weight: 53.1 kg (117 lb) 61.2 kg (135 lb)    Exam: General exam: In no acute distress, Cachectic Respiratory system: Good air movement and clear to auscultation. Cardiovascular system: Regular rate and rhythm with positive S1-S2 no JVD. Gastrointestinal system: Abdomen is soft with right upper quadrant and epigastric tenderness no rebound or guarding. Central nervous system: Alert and oriented 3, nonfocal. Extremities: No lower extremity edema Skin: No rashes, lesions or ulcers Psychiatry: Judgment and insight appeared normal.   Data Reviewed:    Labs: Basic Metabolic Panel:  Recent Labs Lab 07/29/17 0520 07/30/17 0832 07/31/17 0005 07/31/17 0810 08/01/17 0718    NA 137 136 137 137 133*  K 3.0* 3.2* 2.9* 3.2* 3.5  CL 100* 107 113* 111 109  CO2 22 22 16* 16* 16*  GLUCOSE 138* 101* 94 93 105*  BUN 30* 28* 25* 26* 27*  CREATININE 1.62* 1.81* 1.69* 1.89* 1.72*  CALCIUM 8.8* 8.1* 7.4* 7.7* 7.7*   GFR Estimated Creatinine Clearance: 42 mL/min (A) (by C-G formula based on SCr of 1.72 mg/dL (H)). Liver Function Tests:  Recent Labs Lab 07/29/17 0520 07/30/17 0832 07/31/17 0005 07/31/17 0810  AST 120* 70* 91* 98*  ALT 52 33 33 36  ALKPHOS 203* 161* 195* 190*  BILITOT 1.2 1.3* 1.2 1.0  PROT 6.9 5.8* 4.7* 5.0*  ALBUMIN 3.1* 2.6* 2.1* 2.2*    Recent Labs Lab 07/29/17 0520  LIPASE 30   No results for input(s): AMMONIA in the last 168 hours. Coagulation profile  Recent Labs Lab 07/30/17 1914  INR 1.11    CBC:  Recent Labs Lab 07/29/17 0520 07/30/17 0832 07/30/17 1914 08/01/17 0718  WBC 1.2* 0.8* 1.2* 0.8*  NEUTROABS 0.9*  --  1.0*  --   HGB 6.0* 9.9* 7.7* 8.0*  HCT 18.5* 29.3* 22.2* 23.5*  MCV 62.5* 70.6* 70.3* 70.4*  PLT 205 137* 97* 98*   Cardiac Enzymes: No results for input(s): CKTOTAL, CKMB, CKMBINDEX, TROPONINI in the last 168 hours. BNP (last 3 results) No results for input(s): PROBNP in the last 8760 hours. CBG: No results for input(s): GLUCAP in the last 168 hours. D-Dimer: No results for input(s): DDIMER in the last 72 hours. Hgb A1c: No results for input(s): HGBA1C in the last 72 hours. Lipid Profile: No results for input(s): CHOL, HDL, LDLCALC, TRIG, CHOLHDL, LDLDIRECT in the last 72 hours. Thyroid function studies:  Recent Labs  08/01/17 0718  TSH 6.389*   Anemia work up:  Recent Labs  07/29/17 1329  VITAMINB12 2,842*  FOLATE 5.2*  FERRITIN 1,140*  TIBC 196*  IRON 20*  RETICCTPCT <0.4*   Sepsis Labs:  Recent Labs Lab 07/29/17 0520 07/30/17 0832 07/30/17 1630 07/30/17 1914 07/30/17 2200 08/01/17 0718  PROCALCITON  --   --   --  1.00  --   --   WBC 1.2* 0.8*  --  1.2*  --  0.8*   LATICACIDVEN  --   --  2.3* 0.8 0.5  --    Microbiology No results found for this or any previous visit (from the past 240 hour(s)).   Medications:   . azithromycin  1,200 mg Oral Q Thu  . benztropine  1 mg Oral Daily  . elvitegravir-cobicistat-emtricitabine-tenofovir  1 tablet Oral Q breakfast  . feeding supplement  1 Container Oral TID BM  .  feeding supplement (PRO-STAT SUGAR FREE 64)  30 mL Oral BID  . OLANZapine  5 mg Oral QHS  . pantoprazole  40 mg Oral BID  . senna-docusate  2 tablet Oral BID  . sucralfate  1 g Oral TID WC & HS  . sulfamethoxazole-trimethoprim  1 tablet Oral Once per day on Tue Thu Sat   Continuous Infusions: . 0.45 % NaCl with KCl 20 mEq / L 100 mL/hr at 07/31/17 2100  . fluconazole (DIFLUCAN) IV    . piperacillin-tazobactam (ZOSYN)  IV 3.375 g (08/01/17 0734)  . vancomycin 750 mg (07/31/17 1756)      LOS: 2 days   Charlynne Cousins  [facility] Pager [phone]  *Please refer to [email or web address], password [email or web address] to get updated schedule on who will round on this patient, as hospitalists switch teams weekly. If 7PM-7AM, please contact night-coverage at [email or web address], password [email or web address] for any overnight needs.  08/01/2017, 8:57 AM

## 2017-08-01 NOTE — Progress Notes (Signed)
Unable to get Oral or axillary temp to register, pt feels cool to touch despite being under multiple blankets - rectal temp obtained at 94.9 and repeat orthostatic VS noted - On Call [facility] informed & orders obtained. Charge nurse aware.

## 2017-08-01 NOTE — Progress Notes (Signed)
[facility] for Infectious Disease    Date of Admission:  07/29/2017   Total days of antibiotics 4        Day 3 piptazo        Day 4 fluc           ID: [name] is a 41 y.o. female with advanced HIV disease, poorly controlled, presented with worsening abdominal pain, RUQ, N/V, and fevers. Active Problems:   HIV disease (HCC)   Microcytic anemia   Bipolar disorder (HCC)   Dysphagia   Asthma   GERD (gastroesophageal reflux disease)   Candidal esophagitis (HCC)   Severe protein-calorie malnutrition (HCC)   Abdominal pain   Acute renal failure (ARF) (HCC)   Leukopenia   Neutropenia with fever (HCC)   Acalculous cholecystitis   Bacteremia due to other bacteria   Neutropenic fever (HCC)   Acute kidney injury (nontraumatic) (HCC)   AKI (acute kidney injury) ([place])   Thrombocytopenia (HCC)    Subjective: Hypothermia noted on vitals. She reports still has pain but better controlled with pain medication. Thus far tolerating liquid diet  Medications:  . azithromycin  1,200 mg Oral Q Fri  . benztropine  1 mg Oral Daily  . elvitegravir-cobicistat-emtricitabine-tenofovir  1 tablet Oral Q breakfast  . feeding supplement  1 Container Oral TID BM  . feeding supplement (PRO-STAT SUGAR FREE 64)  30 mL Oral BID  . OLANZapine  5 mg Oral QHS  . pantoprazole  40 mg Oral BID  . senna-docusate  2 tablet Oral BID  . sucralfate  1 g Oral TID WC & HS  . sulfamethoxazole-trimethoprim  1 tablet Oral Once per day on Mon Wed Fri    Objective: Vital signs in last 24 hours: Temp:  [94.9 F (34.9 C)-99 F (37.2 C)] 97.6 F (36.4 C) (08/17 1415) Pulse Rate:  [53-71] 58 (08/17 1415) Resp:  [12-18] 16 (08/17 1415) BP: (112-128)/(74-88) 120/74 (08/17 1415) SpO2:  [100 %] 100 % (08/17 1415) Physical Exam  Constitutional:  oriented to person, place, and time. appears chronically ill and mal-nourished. No distress.  HENT: [place]/AT, PERRLA, no scleral icterus, bitemporal wasting,  thrush+ Mouth/Throat: Oropharynx is clear and moist. No oropharyngeal exudate.  Cardiovascular: Normal rate, regular rhythm and normal heart sounds. Exam reveals no gallop and no friction rub.  No murmur heard.  Pulmonary/Chest: Effort normal and breath sounds normal. No respiratory distress.  has no wheezes.  Neck = supple, no nuchal rigidity Abdominal: Bowel sounds are minimal. Mildly distended Neurological: alert and oriented to person, place, and time.  Skin: Skin is warm and dry. No rash noted. No erythema.  Psychiatric: flat affect  Lab Results  Recent Labs  07/30/17 1914  07/31/17 0810 08/01/17 0718  WBC 1.2*  --   --  0.8*  HGB 7.7*  --   --  8.0*  HCT 22.2*  --   --  23.5*  NA  --   < > 137 133*  K  --   < > 3.2* 3.5  CL  --   < > 111 109  CO2  --   < > 16* 16*  BUN  --   < > 26* 27*  CREATININE  --   < > 1.89* 1.72*  < > = values in this interval not displayed. Liver Panel  Recent Labs  07/31/17 0005 07/31/17 0810  PROT 4.7* 5.0*  ALBUMIN 2.1* 2.2*  AST 91* 98*  ALT 33 36  ALKPHOS  195* 190*  BILITOT 1.2 1.0  BILIDIR  --  0.4  IBILI  --  0.6    Microbiology: 8/15 blood cx ngtd Studies/Results: Nm Hepato W/eject Fract  Result Date: 07/30/2017 CLINICAL DATA:  Sharp right upper quadrant pain and nausea for 2 months. EXAM: NUCLEAR MEDICINE HEPATOBILIARY IMAGING WITH GALLBLADDER EF TECHNIQUE: Sequential images of the abdomen were obtained out to 60 minutes following intravenous administration of radiopharmaceutical. After oral ingestion of Ensure, gallbladder ejection fraction was determined. At 60 min, normal ejection fraction is greater than 33%. RADIOPHARMACEUTICALS:  5.2 mCi Tc-31m Choletec IV COMPARISON:  Right upper quadrant ultrasound dated July 29, 2017. FINDINGS: Prompt uptake and biliary excretion of activity by the liver is seen. Gallbladder activity is visualized, consistent with patency of cystic duct. Biliary activity passes into small bowel,  consistent with patent common bile duct. Calculated gallbladder ejection fraction is 9.2%. (Normal gallbladder ejection fraction with Ensure is greater than 33%.) IMPRESSION: 1. Reduced gallbladder ejection fraction, measuring 9.2%. 2. Patent cystic and common bile ducts. Electronically Signed   By: WTitus DubinM.D.   On: 07/30/2017 16:08   Dg Chest Port 1 View  Result Date: 07/30/2017 CLINICAL DATA:  Sepsis and shortness of Breath EXAM: PORTABLE CHEST 1 VIEW COMPARISON:  07/29/2017 FINDINGS: The heart size and mediastinal contours are within normal limits. Both lungs are clear. The visualized skeletal structures are unremarkable. IMPRESSION: No active disease. Electronically Signed   By: MInez CatalinaM.D.   On: 07/30/2017 17:55     Assessment/Plan: SIRS in HIV patient = initially started on vanco/piptazo. Thus far, she has still pancytopenia below her baseline. Can d/c vancomycin for now since blood cx are ngtd. She is on piptazo for presumed cholecystitis but it appears to be more consistent with biliary dyskinesia. Would d/c piptazo tomorrow if no new + micro comes out of her blood cx.  Increase alk phos = will check ggt   hiv disease/AIDS = she has long standing uncontrolled hiv disease. Her CD 4 count has been at 10 for the past 8 years. Will poor virologic control. Based upon previous visits/labs, we can continue to impress upon her the importance of taking her ART, genvoya daily, but I doubt it would be virologically controlled over the coming months.  Continue with oi proph with bactrim and weekly azithromycin  Leukopenia = likely due to advanced hiv disease  Candidal esophagitis hx/ thrush = she is currently on IV fluconazole. Plan to switch to orals once she takes solids without difficulty  Biliary dyskinesia = continue to monitor over the next few days to decide whether getting cholecystectomy would help her symptoms.   Dr hatcher available over the weekend for  questions  SBaxter FlatteryCRegional West Garden County Hospitalfor Infectious Diseases Cell: [phone]: [phone]  08/01/2017, 2:59 PM

## 2017-08-01 NOTE — Progress Notes (Signed)
Patient refused bactrim stating that she is allergic. Dr. Aileen Fass made aware.

## 2017-08-01 NOTE — Progress Notes (Signed)
Current rectal temp 95.5. Patient is asymptomatic. Dr Aileen Fass paged and made aware.

## 2017-08-02 LAB — BASIC METABOLIC PANEL
Anion gap: 7 (ref 5–15)
BUN: 23 mg/dL — AB (ref 6–20)
CALCIUM: 7.5 mg/dL — AB (ref 8.9–10.3)
CO2: 17 mmol/L — AB (ref 22–32)
CREATININE: 1.35 mg/dL — AB (ref 0.44–1.00)
Chloride: 112 mmol/L — ABNORMAL HIGH (ref 101–111)
GFR calc non Af Amer: 48 mL/min — ABNORMAL LOW (ref >60)
GFR, EST AFRICAN AMERICAN: 56 mL/min — AB (ref >60)
Glucose, Bld: 117 mg/dL — ABNORMAL HIGH (ref 65–99)
Potassium: 3.7 mmol/L (ref 3.5–5.1)
SODIUM: 136 mmol/L (ref 135–145)

## 2017-08-02 LAB — CBC
HCT: 22.6 % — ABNORMAL LOW (ref 36.0–46.0)
Hemoglobin: 7.7 g/dL — ABNORMAL LOW (ref 12.0–15.0)
MCH: 23.8 pg — AB (ref 26.0–34.0)
MCHC: 34.1 g/dL (ref 30.0–36.0)
MCV: 69.8 fL — ABNORMAL LOW (ref 78.0–100.0)
PLATELETS: 112 10*3/uL — AB (ref 150–400)
RBC: 3.24 MIL/uL — AB (ref 3.87–5.11)
RDW: 23.2 % — AB (ref 11.5–15.5)
WBC: 0.7 10*3/uL — CL (ref 4.0–10.5)

## 2017-08-02 LAB — URINE CULTURE: Culture: <10000 — AB

## 2017-08-02 MED ORDER — SODIUM CHLORIDE 0.45 % IV SOLN
INTRAVENOUS | Status: AC
  Administered 2017-08-02 (×2): via INTRAVENOUS
  Filled 2017-08-02 (×2): qty 1000

## 2017-08-02 NOTE — Progress Notes (Signed)
[facility]:  [phone] General Surgery Progress Note   LOS: 3 days  POD -     Chief Complaint: Abdominal pain  Assessment and Plan: 1.  Biliary dyskinesia  His gall bladder will be on the back burner until she is much better medically.  I tried to explain the findings of the HIDA scan.  Will see again on Monday, 8/20.   2.  Sepsis/neutropenia  WBC - 700 - 08/02/2017  Zithromax/diflucan/septra 3.  HIV  Non compliant for meds 4.  AKI 5.  Bipolar disease 6.  Malnutrition 7.  Anemia  Hgb - 7.7 - 08/02/2017 7. DVT prophylaxis - chemoprophylaxis on hold   Active Problems:   HIV disease (HCC)   Microcytic anemia   Bipolar disorder (HCC)   Dysphagia   Asthma   GERD (gastroesophageal reflux disease)   Candidal esophagitis (HCC)   Severe protein-calorie malnutrition (HCC)   Abdominal pain   Acute renal failure (ARF) (HCC)   Leukopenia   Neutropenia with fever (HCC)   Acalculous cholecystitis   Bacteremia due to other bacteria   Neutropenic fever (HCC)   Acute kidney injury (nontraumatic) (HCC)   AKI (acute kidney injury) ([place])   Thrombocytopenia (HCC)  Subjective:  A little sleepy from meds.  Objective:   Vitals:   08/01/17 2042 08/02/17 0502  BP: 120/75 130/80  Pulse: 73 60  Resp: 18 16  Temp: 97.8 F (36.6 C) (!) 97.5 F (36.4 C)  SpO2: 100% 100%     Intake/Output from previous day:  08/17 0701 - 08/18 0700 In: 1278.8 [I.V.:1178.8; IV Piggyback:100] Out: -   Intake/Output this shift:  Total I/O In: 681.3 [P.O.:240; I.V.:191.3; IV Piggyback:250] Out: -    Physical Exam:   General: AA F who is alert.    HEENT: Normal. Pupils equal. .   Lungs: Clear   Abdomen: Sore RUQ   Lab Results:    Recent Labs  08/01/17 0718 08/02/17 0528  WBC 0.8* 0.7*  HGB 8.0* 7.7*  HCT 23.5* 22.6*  PLT 98* 112*    BMET   Recent Labs  08/01/17 0718 08/02/17 0528  NA 133* 136  K 3.5 3.7  CL 109 112*  CO2 16* 17*  GLUCOSE 105* 117*   BUN 27* 23*  CREATININE 1.72* 1.35*  CALCIUM 7.7* 7.5*    PT/INR   Recent Labs  07/30/17 1914  LABPROT 14.3  INR 1.11    ABG  No results for input(s): PHART, HCO3 in the last 72 hours.  Invalid input(s): PCO2, PO2   Studies/Results:  No results found.   Anti-infectives:   Anti-infectives    Start     Dose/Rate Route Frequency Ordered Stop   08/01/17 1400  azithromycin (ZITHROMAX) tablet 1,200 mg     1,200 mg Oral Every Fri 08/01/17 1131     08/01/17 1300  sulfamethoxazole-trimethoprim (BACTRIM DS,SEPTRA DS) 800-160 MG per tablet 1 tablet     1 tablet Oral Once per day on Mon Wed Fri 08/01/17 1131     08/01/17 1000  fluconazole (DIFLUCAN) IVPB 400 mg     400 mg 100 mL/hr over 120 Minutes Intravenous Every 24 hours 07/31/17 1138     07/31/17 1700  vancomycin (VANCOCIN) IVPB 750 mg/150 ml premix  Status:  Discontinued     750 mg 150 mL/hr over 60 Minutes Intravenous Every 24 hours 07/30/17 1738 08/01/17 1451   07/31/17 1400  azithromycin (ZITHROMAX) tablet 1,200 mg  Status:  Discontinued  1,200 mg Oral Every Thu 07/31/17 1036 08/01/17 1131   07/31/17 1200  sulfamethoxazole-trimethoprim (BACTRIM DS,SEPTRA DS) 800-160 MG per tablet 1 tablet  Status:  Discontinued     1 tablet Oral Once per day on Tue Thu Sat 07/31/17 1036 08/01/17 1131   07/31/17 1000  fluconazole (DIFLUCAN) IVPB 200 mg  Status:  Discontinued     200 mg 100 mL/hr over 60 Minutes Intravenous Every 24 hours 07/31/17 0838 07/31/17 1138   07/30/17 2200  piperacillin-tazobactam (ZOSYN) IVPB 3.375 g  Status:  Discontinued     3.375 g 12.5 mL/hr over 240 Minutes Intravenous Every 8 hours 07/30/17 1558 08/02/17 0746   07/30/17 1730  piperacillin-tazobactam (ZOSYN) IVPB 3.375 g  Status:  Discontinued     3.375 g 100 mL/hr over 30 Minutes Intravenous  Once 07/30/17 1728 07/30/17 1729   07/30/17 1730  vancomycin (VANCOCIN) IVPB 1000 mg/200 mL premix     1,000 mg 200 mL/hr over 60 Minutes Intravenous  Once  07/30/17 1728 07/30/17 1918   07/30/17 1600  piperacillin-tazobactam (ZOSYN) IVPB 3.375 g     3.375 g 100 mL/hr over 30 Minutes Intravenous STAT 07/30/17 1556 07/30/17 1726   07/30/17 1000  fluconazole (DIFLUCAN) IVPB 100 mg  Status:  Discontinued     100 mg 50 mL/hr over 60 Minutes Intravenous Every 24 hours 07/30/17 0603 07/30/17 0605   07/30/17 1000  fluconazole (DIFLUCAN) IVPB 100 mg  Status:  Discontinued     100 mg 50 mL/hr over 60 Minutes Intravenous Every 24 hours 07/30/17 0604 07/31/17 0838   07/29/17 1200  elvitegravir-cobicistat-emtricitabine-tenofovir (GENVOYA) 150-150-200-10 MG tablet 1 tablet     1 tablet Oral Daily with breakfast 07/29/17 0933     07/29/17 1000  fluconazole (DIFLUCAN) IVPB 100 mg  Status:  Discontinued     100 mg 50 mL/hr over 60 Minutes Intravenous Every 24 hours 07/29/17 0847 07/30/17 0603      Alphonsa Overall, MD, FACS Pager: [facility] Office: [phone] 08/02/2017

## 2017-08-02 NOTE — Evaluation (Signed)
Physical Therapy Evaluation Patient Details Name: [name] MRN: [id number] DOB: [birth year] Today's Date: 08/02/2017   History of Present Illness  41 yo female admitted with abd pain, sepsis. Hx of HIV, dysphagia, Hep B, psychosis, bipolar d/o, anemai, noncompliance.     Clinical Impression  On eval, pt was supervision level assist for mobility. She walked ~250 feet while pushing IV pole. No LOB. Minimal pain with activity. Recommend daily ambulation with nursing supervision during hospital stay. Do not anticipate any f/u PT needs.      Follow Up Recommendations No PT follow up    Equipment Recommendations  None recommended by PT    Recommendations for Other Services       Precautions / Restrictions Precautions Precautions: None Restrictions Weight Bearing Restrictions: No      Mobility  Bed Mobility Overal bed mobility: Modified Independent                Transfers Overall transfer level: Modified independent                  Ambulation/Gait Ambulation/Gait assistance: Supervision Ambulation Distance (Feet): 250 Feet Assistive device:  (IV pole) Gait Pattern/deviations: Step-through pattern     General Gait Details: slow but steady gait speed. No LOB  Stairs            Wheelchair Mobility    Modified Rankin (Stroke Patients Only)       Balance                                             Pertinent Vitals/Pain Pain Assessment: Faces Faces Pain Scale: Hurts little more Pain Location: abdomen. R side Pain Descriptors / Indicators: Sore Pain Intervention(s): Monitored during session    Home Living Family/patient expects to be discharged to:: Private residence Living Arrangements: Children Available Help at Discharge: Family Type of Home: House Home Access: Stairs to enter   Technical brewer of Steps: 3-front, none-back Home Layout: One level Home Equipment: None      Prior Function Level of  Independence: Independent               Hand Dominance        Extremity/Trunk Assessment   Upper Extremity Assessment Upper Extremity Assessment: Overall WFL for tasks assessed    Lower Extremity Assessment Lower Extremity Assessment: Overall WFL for tasks assessed    Cervical / Trunk Assessment Cervical / Trunk Assessment: Normal  Communication   Communication: No difficulties  Cognition Arousal/Alertness: Awake/alert Behavior During Therapy: WFL for tasks assessed/performed Overall Cognitive Status: Within Functional Limits for tasks assessed                                        General Comments      Exercises     Assessment/Plan    PT Assessment Patent does not need any further PT services  PT Problem List Decreased mobility;Pain       PT Treatment Interventions      PT Goals (Current goals can be found in the Care Plan section)  Acute Rehab PT Goals Patient Stated Goal: home soon PT Goal Formulation: All assessment and education complete, DC therapy    Frequency     Barriers to discharge  Co-evaluation               AM-PAC PT "6 Clicks" Daily Activity  Outcome Measure Difficulty turning over in bed (including adjusting bedclothes, sheets and blankets)?: None Difficulty moving from lying on back to sitting on the side of the bed? : None Difficulty sitting down on and standing up from a chair with arms (e.g., wheelchair, bedside commode, etc,.)?: None Help needed moving to and from a bed to chair (including a wheelchair)?: None Help needed walking in hospital room?: A Little Help needed climbing 3-5 steps with a railing? : A Little 6 Click Score: 22    End of Session   Activity Tolerance: Patient tolerated treatment well Patient left: in bed;with call bell/phone within reach;with bed alarm set   PT Visit Diagnosis: Difficulty in walking, not elsewhere classified (R26.2)    Time: 0488-8916 PT Time  Calculation (min) (ACUTE ONLY): 23 min   Charges:   PT Evaluation $PT Eval Low Complexity: 1 Low     PT G Codes:          Weston Anna, MPT Pager: [phone]

## 2017-08-02 NOTE — Progress Notes (Signed)
[facility] PROGRESS NOTE    Progress Note  [name]  MPN:[id number] DOB: 22-Dec-1975 DOA: 07/29/2017 PCP: Nolene Ebbs, MD     Brief Narrative:   [name] is an 41 y.o. female past medical history of HIV noncompliant, dysphagia due to Candida, microcytic anemia hepatitis B bipolar disorder who presents to the was a long ED with chief complaint of right upper quadrant pain for 2 months associated with emesis and weight loss, she reports that her pain started on 04/30/2017 after her EGD with esophageal dilation for which she did not complete her Diflucan treatment. Since then she's been having dysphasia worse with solid foods and liquids made better with heating pad.  Assessment/Plan:   Acute on chronic Right upper quadrant pain associated with emesis: - Right upper quadrant ultrasound and chest x-ray showed no acute findings. - Hepatobiliary scan Biliary dyskinesia. - Consulted general surgery, that relates low likelyhood of biliry disease.  Sepsis/Neutropenic fever: - She was fluid resuscitated with improvement in her vitals and lactate. - Infectious disease was consulted recommended blood cultures to rule out MAC, DC IV vancomycin and Zosyn cultures have remained negative. - Urine cultures Are negative - Her neutropenia continues to be 1.2 and 0.8.  HIV with leukopenia and medication noncompliance: - CD4 count is less than 50 will start Bactrim and azithromycin, for Pneumocystis jirovecii and Mycobacterium prophylaxis - Continue home genvoya. - Patient refused due to questionable allergies.  Esophageal strictures and stenosis associated with dysphagia in the setting of Candida esophagitis: Continue IV Diflucan her pain is improved. Regular diet  Microcytic anemia: Hemoglobin usually ranges around 8.7-9, on admission it was 6.0. Status post 2 units of packed red blood cells, hemoglobin is stable.  GERD: She was not taking her Protonix, Zyprexa and  [name] due to nausea vomiting and abdominal pain.  Hypokalemia: Likely due to emesis. Resolved.  Acute kidney injury: Baseline creatinine less than 1 Creatinine continues to improve slowly, he likely prerenal in etiology. Will continue IV fluids for 1 additional day.  History of asthma: Continue current home medications.  Bipolar disorder: Continue current home regimen.  New Non-anion gap metabolic acidosis: Possibly due to renal disease. Continue normal saline, acidosis is improving slowly.  Severe protein caloric malnutrition: Malnutrition Type:  Nutrition Problem: Malnutrition (Severe) Etiology: chronic illness (HIV)   Malnutrition Characteristics:  Signs/Symptoms: energy intake < or equal to 50% for > or equal to 1 month, severe depletion of body fat, severe depletion of muscle mass   Nutrition Interventions:  Interventions: Boost Breeze, Prostat  New Hypothermia: Vital signs are stable 5 blankets this was a rectal temperature will continue to monitor closely.  DVT prophylaxis: SCD Family Communication:none Disposition Plan/Barrier to D/C: unable to detemrine Code Status:     Code Status Orders        Start     Ordered   07/29/17 0846  Full code  Continuous     07/29/17 0846    Code Status History    Date Active Date Inactive Code Status Order ID Comments User Context   10/23/2016 10:55 PM 10/28/2016  6:40 PM Full Code [id number]  Edwin Dada, MD Inpatient   12/06/2014  1:13 AM 12/12/2014  5:25 PM Full Code [id number]  Waldemar Dickens, MD Inpatient   08/10/2014  1:58 PM 08/20/2014  5:10 PM Full Code [id number]  Theodis Blaze, MD Inpatient   12/27/2013 10:14 PM 12/29/2013  4:57 PM Full Code [id number]  Annita Brod, MD Inpatient  07/09/2013  4:31 PM 07/16/2013  1:54 PM Full Code [id number]  Theodis Blaze, MD Inpatient   03/05/2012  5:35 PM 03/08/2012  6:56 PM Full Code [id number]  Delora Fuel, MD ED        IV Access:    Peripheral  IV   Procedures and diagnostic studies:   No results found.   Medical Consultants:    None.  Anti-Infectives:    oral Bactrim and azithromycin.  Subjective:    [name] she relates her pain is better. Tolerating regular diet. Objective:    Vitals:   08/01/17 1400 08/01/17 1415 08/01/17 2042 08/02/17 0502  BP:  120/74 120/75 130/80  Pulse:  (!) 58 73 60  Resp:  16 18 16   Temp: (!) 97.5 F (36.4 C) 97.6 F (36.4 C) 97.8 F (36.6 C) (!) 97.5 F (36.4 C)  TempSrc: Oral Axillary Oral Oral  SpO2:  100% 100% 100%  Weight:    68.9 kg (152 lb)  Height:        Intake/Output Summary (Last 24 hours) at 08/02/17 0743 Last data filed at 08/02/17 0500  Gross per 24 hour  Intake          1278.75 ml  Output                0 ml  Net          1278.75 ml   Filed Weights   07/29/17 1034 07/31/17 0445 08/02/17 0502  Weight: 53.1 kg (117 lb) 61.2 kg (135 lb) 68.9 kg (152 lb)    Exam: General exam: In no acute distress, Cachectic Respiratory system: Good air movement and clear to auscultation. Cardiovascular system: Regular rate and rhythm with positive S1-S2 no JVD. Gastrointestinal system: Abdomen is soft with right upper quadrant and epigastric tenderness no rebound or guarding. Central nervous system: Alert and oriented 3, nonfocal. Extremities: No lower extremity edema Skin: No rashes, lesions or ulcers Psychiatry: Judgment and insight appeared normal.   Data Reviewed:    Labs: Basic Metabolic Panel:  Recent Labs Lab 07/30/17 0832 07/31/17 0005 07/31/17 0810 08/01/17 0718 08/02/17 0528  NA 136 137 137 133* 136  K 3.2* 2.9* 3.2* 3.5 3.7  CL 107 113* 111 109 112*  CO2 22 16* 16* 16* 17*  GLUCOSE 101* 94 93 105* 117*  BUN 28* 25* 26* 27* 23*  CREATININE 1.81* 1.69* 1.89* 1.72* 1.35*  CALCIUM 8.1* 7.4* 7.7* 7.7* 7.5*   GFR Estimated Creatinine Clearance: 60.3 mL/min (A) (by C-G formula based on SCr of 1.35 mg/dL (H)). Liver Function  Tests:  Recent Labs Lab 07/29/17 0520 07/30/17 0832 07/31/17 0005 07/31/17 0810  AST 120* 70* 91* 98*  ALT 52 33 33 36  ALKPHOS 203* 161* 195* 190*  BILITOT 1.2 1.3* 1.2 1.0  PROT 6.9 5.8* 4.7* 5.0*  ALBUMIN 3.1* 2.6* 2.1* 2.2*    Recent Labs Lab 07/29/17 0520  LIPASE 30   No results for input(s): AMMONIA in the last 168 hours. Coagulation profile  Recent Labs Lab 07/30/17 1914  INR 1.11    CBC:  Recent Labs Lab 07/29/17 0520 07/30/17 0832 07/30/17 1914 08/01/17 0718 08/02/17 0528  WBC 1.2* 0.8* 1.2* 0.8* 0.7*  NEUTROABS 0.9*  --  1.0*  --   --   HGB 6.0* 9.9* 7.7* 8.0* 7.7*  HCT 18.5* 29.3* 22.2* 23.5* 22.6*  MCV 62.5* 70.6* 70.3* 70.4* 69.8*  PLT 205 137* 97* 98* 112*   Cardiac Enzymes: No results for  input(s): CKTOTAL, CKMB, CKMBINDEX, TROPONINI in the last 168 hours. BNP (last 3 results) No results for input(s): PROBNP in the last 8760 hours. CBG: No results for input(s): GLUCAP in the last 168 hours. D-Dimer: No results for input(s): DDIMER in the last 72 hours. Hgb A1c: No results for input(s): HGBA1C in the last 72 hours. Lipid Profile: No results for input(s): CHOL, HDL, LDLCALC, TRIG, CHOLHDL, LDLDIRECT in the last 72 hours. Thyroid function studies:  Recent Labs  08/01/17 0718  TSH 6.389*   Anemia work up: No results for input(s): VITAMINB12, FOLATE, FERRITIN, TIBC, IRON, RETICCTPCT in the last 72 hours. Sepsis Labs:  Recent Labs Lab 07/30/17 0832 07/30/17 1630 07/30/17 1914 07/30/17 2200 08/01/17 0718 08/02/17 0528  PROCALCITON  --   --  1.00  --   --   --   WBC 0.8*  --  1.2*  --  0.8* 0.7*  LATICACIDVEN  --  2.3* 0.8 0.5  --   --    Microbiology Recent Results (from the past 240 hour(s))  Culture, blood (Routine X 2) w Reflex to ID Panel     Status: None (Preliminary result)   Collection Time: 07/30/17 11:50 PM  Result Value Ref Range Status   Specimen Description BLOOD RIGHT ANTECUBITAL  Final   Special Requests    Final    BOTTLES DRAWN AEROBIC ONLY Blood Culture adequate volume   Culture   Final    NO GROWTH 1 DAY Performed at [facility], [place] [address] [place], [place] [address]    Report Status PENDING  Incomplete  Culture, blood (x 2)     Status: None (Preliminary result)   Collection Time: 07/30/17 11:55 PM  Result Value Ref Range Status   Specimen Description BLOOD BLOOD LEFT HAND  Final   Special Requests IN PEDIATRIC BOTTLE Blood Culture adequate volume  Final   Culture   Final    NO GROWTH 1 DAY Performed at [facility], [place] [address] [place], [place] [address]    Report Status PENDING  Incomplete     Medications:   . azithromycin  1,200 mg Oral Q Fri  . benztropine  1 mg Oral Daily  . elvitegravir-cobicistat-emtricitabine-tenofovir  1 tablet Oral Q breakfast  . feeding supplement  1 Container Oral TID BM  . feeding supplement (PRO-STAT SUGAR FREE 64)  30 mL Oral BID  . OLANZapine  5 mg Oral QHS  . pantoprazole  40 mg Oral BID  . senna-docusate  2 tablet Oral BID  . sucralfate  1 g Oral TID WC & HS  . sulfamethoxazole-trimethoprim  1 tablet Oral Once per day on Mon Wed Fri   Continuous Infusions: . fluconazole (DIFLUCAN) IV Stopped (08/01/17 1316)  . piperacillin-tazobactam (ZOSYN)  IV 3.375 g (08/02/17 0702)  . sodium chloride 0.45 % 1,000 mL with potassium chloride 20 mEq, sodium bicarbonate 50 mEq infusion 75 mL/hr at 08/01/17 1317      LOS: 3 days   Charlynne Cousins  [facility] Pager [phone]  *Please refer to [email or web address], password [email or web address] to get updated schedule on who will round on this patient, as hospitalists switch teams weekly. If 7PM-7AM, please contact night-coverage at [email or web address], password [email or web address] for any overnight needs.  08/02/2017, 7:43 AM

## 2017-08-02 NOTE — Progress Notes (Signed)
Informed Dr. Aileen Fass of patient hallucinating bugs crawling on her bed, and her "hair moving".  This has been occurring since early this morning during night shift.  Pt pleasantly eating in room, will continue to monitor.  Iantha Fallen RN 2:40 PM 08/02/2017

## 2017-08-03 DIAGNOSIS — E43 Unspecified severe protein-calorie malnutrition: Secondary | ICD-10-CM

## 2017-08-03 MED ORDER — FLUCONAZOLE 200 MG PO TABS
400.0000 mg | ORAL_TABLET | Freq: Every day | ORAL | Status: DC
  Administered 2017-08-03 – 2017-08-05 (×3): 400 mg via ORAL
  Filled 2017-08-03 (×3): qty 2

## 2017-08-03 NOTE — Progress Notes (Signed)
[facility] PROGRESS NOTE    Progress Note  [name]  VEL:[id number] DOB: 08-10-1976 DOA: 07/29/2017 PCP: Nolene Ebbs, MD     Brief Narrative:   [name] is an 41 y.o. female past medical history of HIV noncompliant, dysphagia due to Candida, microcytic anemia hepatitis B bipolar disorder who presents to the was a long ED with chief complaint of right upper quadrant pain for 2 months associated with emesis and weight loss, she reports that her pain started on 04/30/2017 after her EGD with esophageal dilation for which she did not complete her Diflucan treatment. Since then she's been having dysphasia worse with solid foods and liquids made better with heating pad.  Assessment/Plan:   Acute on chronic Right upper quadrant pain associated with emesis: - Hepatobiliary scan Biliary dyskinesia. - Consulted general surgery, that relates low likelyhood of biliry disease.  Sepsis/Neutropenic fever: - She was fluid resuscitated and start empiric antibiotics which now been DC'd. - Infectious disease was consulted recommended blood cultures to rule out MAC, other culture data has remained negative. - Her neutropenia continues to be 1.2- 0.7.  HIV with leukopenia and medication noncompliance: - CD4 count is less than 50 will start Bactrim and azithromycin, for Pneumocystis jirovecii and Mycobacterium prophylaxis - Continue home genvoya.  Esophageal strictures and stenosis associated with dysphagia in the setting of Candida esophagitis: Change Diflucan to oral tablets. Regular diet  Microcytic anemia: Hemoglobin usually ranges around 8.7-9, on admission it was 6.0. Status post 2 units of packed red blood cells, hemoglobin is stable.  GERD: She was not taking her Protonix, Zyprexa and [name] due to nausea vomiting and abdominal pain.  Hypokalemia: Likely due to emesis. Resolved.  Acute kidney injury: Baseline creatinine less than 1 Creatinine continues to  improve slowly, he likely prerenal in etiology. KVO IV fluids.  History of asthma: Continue current home medications.  Bipolar disorder: Continue current home regimen.  New Non-anion gap metabolic acidosis: Possibly due to renal disease. Continue normal saline, acidosis is improving slowly.  Severe protein caloric malnutrition: Malnutrition Type:  Nutrition Problem: Malnutrition (Severe) Etiology: chronic illness (HIV)   Malnutrition Characteristics:  Signs/Symptoms: energy intake < or equal to 50% for > or equal to 1 month, severe depletion of body fat, severe depletion of muscle mass   Nutrition Interventions:  Interventions: Boost Breeze, Prostat  New Hypothermia: Vital signs are stable 5 blankets this was a rectal temperature will continue to monitor closely.  DVT prophylaxis: SCD Family Communication:none Disposition Plan/Barrier to D/C: unable to detemrine Code Status:     Code Status Orders        Start     Ordered   07/29/17 0846  Full code  Continuous     07/29/17 0846    Code Status History    Date Active Date Inactive Code Status Order ID Comments User Context   10/23/2016 10:55 PM 10/28/2016  6:40 PM Full Code [id number]  Edwin Dada, MD Inpatient   12/06/2014  1:13 AM 12/12/2014  5:25 PM Full Code [id number]  Waldemar Dickens, MD Inpatient   08/10/2014  1:58 PM 08/20/2014  5:10 PM Full Code [id number]  Theodis Blaze, MD Inpatient   12/27/2013 10:14 PM 12/29/2013  4:57 PM Full Code [id number]  Annita Brod, MD Inpatient   07/09/2013  4:31 PM 07/16/2013  1:54 PM Full Code [id number]  Theodis Blaze, MD Inpatient   03/05/2012  5:35 PM 03/08/2012  6:56 PM Full Code [id number]  Roxanne Mins,  Shanon Brow, MD ED        IV Access:    Peripheral IV   Procedures and diagnostic studies:   No results found.   Medical Consultants:    None.  Anti-Infectives:    oral Bactrim and azithromycin.  Subjective:    [name] tolerating her diet no new  complaints. Objective:    Vitals:   08/02/17 0502 08/02/17 1337 08/02/17 2025 08/03/17 0518  BP: 130/80 132/86 132/89 118/78  Pulse: 60 66 67 82  Resp: _0 Temp: (!) 97.5 F (36.4 C) 98.1 F (36.7 C) 98.7 F (37.1 C) 98.9 F (37.2 C)  TempSrc: Oral Oral Oral Oral  SpO2: 100% 100% 100% 98%  Weight: 68.9 kg (152 lb)   61.6 kg (135 lb 12.8 oz)  Height:        Intake/Output Summary (Last 24 hours) at 08/03/17 0815 Last data filed at 08/03/17 0600  Gross per 24 hour  Intake          2506.25 ml  Output             2550 ml  Net           -43.75 ml   Filed Weights   07/31/17 0445 08/02/17 0502 08/03/17 0518  Weight: 61.2 kg (135 lb) 68.9 kg (152 lb) 61.6 kg (135 lb 12.8 oz)    Exam: General exam: In no acute distress, Cachectic Respiratory system: Good air movement and clear to auscultation. Cardiovascular system: Regular rate and rhythm with positive S1-S2 no JVD. Gastrointestinal system: Abdomen is soft with right upper quadrant and epigastric tenderness no rebound or guarding. Central nervous system: Alert and oriented 3, nonfocal. Extremities: No lower extremity edema Skin: No rashes, lesions or ulcers Psychiatry: Judgment and insight appeared normal.   Data Reviewed:    Labs: Basic Metabolic Panel:  Recent Labs Lab 07/30/17 0832 07/31/17 0005 07/31/17 0810 08/01/17 0718 08/02/17 0528  NA 136 137 137 133* 136  K 3.2* 2.9* 3.2* 3.5 3.7  CL 107 113* 111 109 112*  CO2 22 16* 16* 16* 17*  GLUCOSE 101* 94 93 105* 117*  BUN 28* 25* 26* 27* 23*  CREATININE 1.81* 1.69* 1.89* 1.72* 1.35*  CALCIUM 8.1* 7.4* 7.7* 7.7* 7.5*   GFR Estimated Creatinine Clearance: 53.9 mL/min (A) (by C-G formula based on SCr of 1.35 mg/dL (H)). Liver Function Tests:  Recent Labs Lab 07/29/17 0520 07/30/17 0832 07/31/17 0005 07/31/17 0810  AST 120* 70* 91* 98*  ALT 52 33 33 36  ALKPHOS 203* 161* 195* 190*  BILITOT 1.2 1.3* 1.2 1.0  PROT 6.9 5.8* 4.7* 5.0*  ALBUMIN  3.1* 2.6* 2.1* 2.2*    Recent Labs Lab 07/29/17 0520  LIPASE 30   No results for input(s): AMMONIA in the last 168 hours. Coagulation profile  Recent Labs Lab 07/30/17 1914  INR 1.11    CBC:  Recent Labs Lab 07/29/17 0520 07/30/17 0832 07/30/17 1914 08/01/17 0718 08/02/17 0528  WBC 1.2* 0.8* 1.2* 0.8* 0.7*  NEUTROABS 0.9*  --  1.0*  --   --   HGB 6.0* 9.9* 7.7* 8.0* 7.7*  HCT 18.5* 29.3* 22.2* 23.5* 22.6*  MCV 62.5* 70.6* 70.3* 70.4* 69.8*  PLT 205 137* 97* 98* 112*   Cardiac Enzymes: No results for input(s): CKTOTAL, CKMB, CKMBINDEX, TROPONINI in the last 168 hours. BNP (last 3 results) No results for input(s): PROBNP in the last 8760 hours. CBG: No results for input(s): GLUCAP in the  last 168 hours. D-Dimer: No results for input(s): DDIMER in the last 72 hours. Hgb A1c: No results for input(s): HGBA1C in the last 72 hours. Lipid Profile: No results for input(s): CHOL, HDL, LDLCALC, TRIG, CHOLHDL, LDLDIRECT in the last 72 hours. Thyroid function studies:  Recent Labs  08/01/17 0718  TSH 6.389*   Anemia work up: No results for input(s): VITAMINB12, FOLATE, FERRITIN, TIBC, IRON, RETICCTPCT in the last 72 hours. Sepsis Labs:  Recent Labs Lab 07/30/17 0832 07/30/17 1630 07/30/17 1914 07/30/17 2200 08/01/17 0718 08/02/17 0528  PROCALCITON  --   --  1.00  --   --   --   WBC 0.8*  --  1.2*  --  0.8* 0.7*  LATICACIDVEN  --  2.3* 0.8 0.5  --   --    Microbiology Recent Results (from the past 240 hour(s))  Culture, blood (Routine X 2) w Reflex to ID Panel     Status: None (Preliminary result)   Collection Time: 07/30/17 11:50 PM  Result Value Ref Range Status   Specimen Description BLOOD RIGHT ANTECUBITAL  Final   Special Requests   Final    BOTTLES DRAWN AEROBIC ONLY Blood Culture adequate volume   Culture   Final    NO GROWTH 2 DAYS Performed at [facility], [place] [address] [place], [place] [address]    Report Status PENDING  Incomplete    Culture, blood (x 2)     Status: None (Preliminary result)   Collection Time: 07/30/17 11:55 PM  Result Value Ref Range Status   Specimen Description BLOOD BLOOD LEFT HAND  Final   Special Requests IN PEDIATRIC BOTTLE Blood Culture adequate volume  Final   Culture   Final    NO GROWTH 2 DAYS Performed at [facility], [place] [address], [place], [place] [address]    Report Status PENDING  Incomplete  Culture, Urine     Status: Abnormal   Collection Time: 07/31/17  3:57 PM  Result Value Ref Range Status   Specimen Description URINE, RANDOM  Final   Special Requests NONE  Final   Culture (A)  Final    <10,000 COLONIES/mL INSIGNIFICANT GROWTH Performed at [facility], [place] [address], [place], [place] [address]    Report Status 08/02/2017 FINAL  Final     Medications:   . azithromycin  1,200 mg Oral Q Fri  . benztropine  1 mg Oral Daily  . elvitegravir-cobicistat-emtricitabine-tenofovir  1 tablet Oral Q breakfast  . feeding supplement  1 Container Oral TID BM  . feeding supplement (PRO-STAT SUGAR FREE 64)  30 mL Oral BID  . OLANZapine  5 mg Oral QHS  . pantoprazole  40 mg Oral BID  . senna-docusate  2 tablet Oral BID  . sucralfate  1 g Oral TID WC & HS  . sulfamethoxazole-trimethoprim  1 tablet Oral Once per day on Mon Wed Fri   Continuous Infusions: . fluconazole (DIFLUCAN) IV Stopped (08/02/17 1138)      LOS: 4 days   Charlynne Cousins  [facility] Pager [phone]  *Please refer to [email or web address], password [email or web address] to get updated schedule on who will round on this patient, as hospitalists switch teams weekly. If 7PM-7AM, please contact night-coverage at [email or web address], password [email or web address] for any overnight needs.  08/03/2017, 8:15 AM

## 2017-08-04 MED ORDER — HYDROXYZINE HCL 25 MG PO TABS: 25.0000 mg | ORAL_TABLET | ORAL | Status: DC | PRN

## 2017-08-04 MED ORDER — SULFAMETHOXAZOLE-TRIMETHOPRIM 400-80 MG PO TABS: 1.0000 | ORAL_TABLET | Freq: Every day | ORAL | Status: DC

## 2017-08-04 MED ORDER — BENZTROPINE MESYLATE 1 MG PO TABS
1.0000 mg | ORAL_TABLET | Freq: Every day | ORAL | Status: DC
Start: 2017-08-05 — End: 2017-08-05
  Filled 2017-08-04: qty 1

## 2017-08-04 MED ORDER — QUETIAPINE FUMARATE 25 MG PO TABS
50.0000 mg | ORAL_TABLET | Freq: Every day | ORAL | Status: DC
  Administered 2017-08-04: 50 mg via ORAL
  Filled 2017-08-04: qty 2

## 2017-08-04 NOTE — Progress Notes (Signed)
[facility]/Trauma Progress Note      Assessment/Plan Active Problems:   HIV disease (HCC)   Microcytic anemia   Bipolar disorder (HCC)   Dysphagia   Asthma   GERD (gastroesophageal reflux disease)   Candidal esophagitis (HCC)   Severe protein-calorie malnutrition (HCC)   Abdominal pain   Acute renal failure (ARF) (HCC)   Leukopenia   Neutropenia with fever (HCC)   Acalculous cholecystitis   Bacteremia due to other bacteria   Neutropenic fever (HCC)   Acute kidney injury (nontraumatic) (HCC)   AKI (acute kidney injury) ([place])   Thrombocytopenia (HCC)  Biliary dyskinesia - Will put off surgery until she is much better medically            Sepsis/neutropenia - WBC - 700 - 08/02/2017 - Zithromax/diflucan/septra HIV - Non compliant for meds Anemia - Hgb - 7.7 - 08/02/2017   FEN: reg diet VTE: SCD's, lovenox on hold due to anemia ID: ID following Follow up: Dr. Excell Seltzer  DISPO: Will hold off on surgery until more medically stable, this may be an outpt procedure depending on her hospital course. We will sign off at this time. If she becomes more medically stable this stay please page [place].   Agree with above. Severe leukopenia.  Thrombocytopenia.  Not a candidate for surgery at this time. We can see in the office as an outpatient in 4 to 6 weeks. DN   LOS: 5 days    Subjective:  CC: epigastric abdominal pain  Pt states no vomiting or nausea. Had a BM yesterday. No new complaints.   Objective: Vital signs in last 24 hours: Temp:  [97.8 F (36.6 C)-98.3 F (36.8 C)] 98.3 F (36.8 C) (08/20 0520) Pulse Rate:  [65-87] 68 (08/20 0520) Resp:  [16-18] 16 (08/20 0520) BP: (117-145)/(81-88) 145/88 (08/20 0520) SpO2:  [98 %-100 %] 100 % (08/20 0520) Weight:  [155 lb 6.4 oz (70.5 kg)] 155 lb 6.4 oz (70.5 kg) (08/20 0520) Last BM Date: 08/03/17  Intake/Output from previous day: 08/19 0701 - 08/20 0700 In: 480 [P.O.:480] Out: 600  [Urine:600] Intake/Output this shift: No intake/output data recorded.  PE: Gen:  Alert, NAD, pleasant, cooperative Card:  RRR, no M/G/R heard Pulm:  Rate and effort normal Abd: Soft, not distended, +BS, no hernias appreciated, TTP and epigastrium and RUQ  Skin: no rashes noted, warm and dry   Anti-infectives: Anti-infectives    Start     Dose/Rate Route Frequency Ordered Stop   08/03/17 1000  fluconazole (DIFLUCAN) tablet 400 mg     400 mg Oral Daily 08/03/17 0849     08/01/17 1400  azithromycin (ZITHROMAX) tablet 1,200 mg     1,200 mg Oral Every Fri 08/01/17 1131     08/01/17 1300  sulfamethoxazole-trimethoprim (BACTRIM DS,SEPTRA DS) 800-160 MG per tablet 1 tablet     1 tablet Oral Once per day on Mon Wed Fri 08/01/17 1131     08/01/17 1000  fluconazole (DIFLUCAN) IVPB 400 mg  Status:  Discontinued     400 mg 100 mL/hr over 120 Minutes Intravenous Every 24 hours 07/31/17 1138 08/03/17 0849   07/31/17 1700  vancomycin (VANCOCIN) IVPB 750 mg/150 ml premix  Status:  Discontinued     750 mg 150 mL/hr over 60 Minutes Intravenous Every 24 hours 07/30/17 1738 08/01/17 1451   07/31/17 1400  azithromycin (ZITHROMAX) tablet 1,200 mg  Status:  Discontinued     1,200 mg Oral Every Thu 07/31/17 1036 08/01/17 1131  07/31/17 1200  sulfamethoxazole-trimethoprim (BACTRIM DS,SEPTRA DS) 800-160 MG per tablet 1 tablet  Status:  Discontinued     1 tablet Oral Once per day on Tue Thu Sat 07/31/17 1036 08/01/17 1131   07/31/17 1000  fluconazole (DIFLUCAN) IVPB 200 mg  Status:  Discontinued     200 mg 100 mL/hr over 60 Minutes Intravenous Every 24 hours 07/31/17 0838 07/31/17 1138   07/30/17 2200  piperacillin-tazobactam (ZOSYN) IVPB 3.375 g  Status:  Discontinued     3.375 g 12.5 mL/hr over 240 Minutes Intravenous Every 8 hours 07/30/17 1558 08/02/17 0746   07/30/17 1730  piperacillin-tazobactam (ZOSYN) IVPB 3.375 g  Status:  Discontinued     3.375 g 100 mL/hr over 30 Minutes Intravenous  Once  07/30/17 1728 07/30/17 1729   07/30/17 1730  vancomycin (VANCOCIN) IVPB 1000 mg/200 mL premix     1,000 mg 200 mL/hr over 60 Minutes Intravenous  Once 07/30/17 1728 07/30/17 1918   07/30/17 1600  piperacillin-tazobactam (ZOSYN) IVPB 3.375 g     3.375 g 100 mL/hr over 30 Minutes Intravenous STAT 07/30/17 1556 07/30/17 1726   07/30/17 1000  fluconazole (DIFLUCAN) IVPB 100 mg  Status:  Discontinued     100 mg 50 mL/hr over 60 Minutes Intravenous Every 24 hours 07/30/17 0603 07/30/17 0605   07/30/17 1000  fluconazole (DIFLUCAN) IVPB 100 mg  Status:  Discontinued     100 mg 50 mL/hr over 60 Minutes Intravenous Every 24 hours 07/30/17 0604 07/31/17 0838   07/29/17 1200  elvitegravir-cobicistat-emtricitabine-tenofovir (GENVOYA) 150-150-200-10 MG tablet 1 tablet     1 tablet Oral Daily with breakfast 07/29/17 0933     07/29/17 1000  fluconazole (DIFLUCAN) IVPB 100 mg  Status:  Discontinued     100 mg 50 mL/hr over 60 Minutes Intravenous Every 24 hours 07/29/17 0847 07/30/17 0603      Lab Results:   Recent Labs  08/02/17 0528  WBC 0.7*  HGB 7.7*  HCT 22.6*  PLT 112*   BMET  Recent Labs  08/02/17 0528  NA 136  K 3.7  CL 112*  CO2 17*  GLUCOSE 117*  BUN 23*  CREATININE 1.35*  CALCIUM 7.5*   PT/INR No results for input(s): LABPROT, INR in the last 72 hours. CMP     Component Value Date/Time   NA 136 08/02/2017 0528   K 3.7 08/02/2017 0528   CL 112 (H) 08/02/2017 0528   CO2 17 (L) 08/02/2017 0528   GLUCOSE 117 (H) 08/02/2017 0528   GLUCOSE 93 06/11/2010   BUN 23 (H) 08/02/2017 0528   CREATININE 1.35 (H) 08/02/2017 0528   CREATININE 0.66 11/12/2016 1702   CALCIUM 7.5 (L) 08/02/2017 0528   PROT 5.0 (L) 07/31/2017 0810   ALBUMIN 2.2 (L) 07/31/2017 0810   AST 98 (H) 07/31/2017 0810   ALT 36 07/31/2017 0810   ALKPHOS 190 (H) 07/31/2017 0810   BILITOT 1.0 07/31/2017 0810   GFRNONAA 48 (L) 08/02/2017 0528   GFRAA 56 (L) 08/02/2017 0528   Lipase     Component Value  Date/Time   LIPASE 30 07/29/2017 0520    Studies/Results: No results found.    Kalman Drape , [facility] 08/04/2017, 10:17 AM Pager: [phone] Consults: [phone] Mon-Fri 7:00 am-4:30 pm Sat-Sun 7:00 am-11:30 am

## 2017-08-04 NOTE — Care Management Note (Signed)
Case Management Note  Patient Details  Name: [name] MRN: [id number] Date of Birth: 08-28-1976  Subjective/Objective:                  sepsis  Action/Plan: Date:  August 04, 2017  Chart reviewed for concurrent status and case management needs.  Will continue to follow patient progress.  Discharge Planning: following for needs  Expected discharge date: [id number]  Velva Harman, BSN, [place], [place]   Expected Discharge Date:   (unknown)               Expected Discharge Plan:  Home/Self Care  In-House Referral:     Discharge planning Services  CM Consult  Post Acute Care Choice:    Choice offered to:     DME Arranged:    DME Agency:     HH Arranged:    [place] Agency:     Status of Service:  In process, will continue to follow  If discussed at Long Length of Stay Meetings, dates discussed:    Additional Comments:  Leeroy Cha, RN 08/04/2017, 10:34 AM

## 2017-08-04 NOTE — Progress Notes (Signed)
[facility] PROGRESS NOTE    Progress Note  [name]  KNL:[id number] DOB: 1976-11-18 DOA: 07/29/2017 PCP: Nolene Ebbs, MD     Brief Narrative:   [name] is an 41 y.o. female past medical history of HIV noncompliant, dysphagia due to Candida, microcytic anemia hepatitis B bipolar disorder who presents to the was a long ED with chief complaint of right upper quadrant pain for 2 months associated with emesis and weight loss, she reports that her pain started on 04/30/2017 after her EGD with esophageal dilation for which she did not complete her Diflucan treatment. Since then she's been having dysphasia worse with solid foods and liquids made better with heating pad.  Assessment/Plan:   Acute on chronic Right upper quadrant pain associated with emesis: - Hepatobiliary scan Biliary dyskinesia. - Consulted general surgery, that relates low likelyhood of biliry disease.  Sepsis/Neutropenic fever: - She was fluid resuscitated and start empiric antibiotics which now been DC'd. - Infectious disease was consulted recommended blood cultures to rule out MAC, other culture data has remained negative. - Awaiting ID further recommendations.  HIV with leukopenia and medication noncompliance: - CD4 count is less than 50 will start Bactrim and azithromycin, for Pneumocystis jirovecii and Mycobacterium prophylaxis, Initially she was refuse her Bactrim but now she is taking it. - Continue home genvoya.  Esophageal strictures and stenosis associated with dysphagia in the setting of Candida esophagitis: Change Diflucan to oral tablets. She continues to tolerate her diet. She is not complaining of some epigastric pain but is tolerating her diet.  Microcytic anemia: Hemoglobin usually ranges around 8.7-9, on admission it was 6.0. Status post 2 units of packed red blood cells, hemoglobin is stable. Likely due to AIDS.  GERD: She was not taking her Protonix, Zyprexa and [name]  due to nausea vomiting and abdominal pain.  Hypokalemia: Likely due to emesis. Resolved.  Acute kidney injury: Baseline creatinine less than 1 Resolved with IV fluid hydration, she is tolerating her diet.  History of asthma: Continue current home medications.  Bipolar disorder: Continue current home regimen.  New Non-anion gap metabolic acidosis: Possibly due to renal disease. Resolved.  Severe protein caloric malnutrition: Malnutrition Type:  Nutrition Problem: Malnutrition (Severe) Etiology: chronic illness (HIV)   Malnutrition Characteristics:  Signs/Symptoms: energy intake < or equal to 50% for > or equal to 1 month, severe depletion of body fat, severe depletion of muscle mass   Nutrition Interventions:  Interventions: Boost Breeze, Prostat  New Hypothermia: Likely due to infectious etiology has not resolved.  DVT prophylaxis: SCD Family Communication:none Disposition Plan/Barrier to D/C: unable to detemrine Code Status:     Code Status Orders        Start     Ordered   07/29/17 0846  Full code  Continuous     07/29/17 0846    Code Status History    Date Active Date Inactive Code Status Order ID Comments User Context   10/23/2016 10:55 PM 10/28/2016  6:40 PM Full Code [id number]  Edwin Dada, MD Inpatient   12/06/2014  1:13 AM 12/12/2014  5:25 PM Full Code [id number]  Waldemar Dickens, MD Inpatient   08/10/2014  1:58 PM 08/20/2014  5:10 PM Full Code [id number]  Theodis Blaze, MD Inpatient   12/27/2013 10:14 PM 12/29/2013  4:57 PM Full Code [id number]  Annita Brod, MD Inpatient   07/09/2013  4:31 PM 07/16/2013  1:54 PM Full Code [id number]  Theodis Blaze, MD Inpatient  03/05/2012  5:35 PM 03/08/2012  6:56 PM Full Code [id number]  Delora Fuel, MD ED        IV Access:    Peripheral IV   Procedures and diagnostic studies:   No results found.   Medical Consultants:    None.  Anti-Infectives:    oral Bactrim and  azithromycin.  Subjective:    [name] tolerating her diet, Is comes complaining of some epigastric pain. Objective:    Vitals:   08/03/17 0518 08/03/17 1347 08/03/17 2034 08/04/17 0520  BP: 118/78 (!) 141/81 117/82 (!) 145/88  Pulse: 82 65 87 68  Resp: _0 Temp: 98.9 F (37.2 C) 97.8 F (36.6 C) 98.2 F (36.8 C) 98.3 F (36.8 C)  TempSrc: Oral Oral Oral Oral  SpO2: 98% 100% 98% 100%  Weight: 61.6 kg (135 lb 12.8 oz)   70.5 kg (155 lb 6.4 oz)  Height:        Intake/Output Summary (Last 24 hours) at 08/04/17 0809 Last data filed at 08/03/17 1835  Gross per 24 hour  Intake              480 ml  Output              600 ml  Net             -120 ml   Filed Weights   08/02/17 0502 08/03/17 0518 08/04/17 0520  Weight: 68.9 kg (152 lb) 61.6 kg (135 lb 12.8 oz) 70.5 kg (155 lb 6.4 oz)    Exam: General exam: In no acute distress, Cachectic Respiratory system: Good air movement clear to auscultation. Cardiovascular system: Regular rate and rhythm with positive S1-S2 no JVD. Gastrointestinal system: Abdomen is Soft nontender nondistended. Central nervous system: Alert and oriented 3, nonfocal. Extremities: No lower extremity edema Skin: No rashes. Psychiatry: Judgment and insight appeared normal.   Data Reviewed:    Labs: Basic Metabolic Panel:  Recent Labs Lab 07/30/17 0832 07/31/17 0005 07/31/17 0810 08/01/17 0718 08/02/17 0528  NA 136 137 137 133* 136  K 3.2* 2.9* 3.2* 3.5 3.7  CL 107 113* 111 109 112*  CO2 22 16* 16* 16* 17*  GLUCOSE 101* 94 93 105* 117*  BUN 28* 25* 26* 27* 23*  CREATININE 1.81* 1.69* 1.89* 1.72* 1.35*  CALCIUM 8.1* 7.4* 7.7* 7.7* 7.5*   GFR Estimated Creatinine Clearance: 61.7 mL/min (A) (by C-G formula based on SCr of 1.35 mg/dL (H)). Liver Function Tests:  Recent Labs Lab 07/29/17 0520 07/30/17 0832 07/31/17 0005 07/31/17 0810  AST 120* 70* 91* 98*  ALT 52 33 33 36  ALKPHOS 203* 161* 195* 190*  BILITOT 1.2  1.3* 1.2 1.0  PROT 6.9 5.8* 4.7* 5.0*  ALBUMIN 3.1* 2.6* 2.1* 2.2*    Recent Labs Lab 07/29/17 0520  LIPASE 30   No results for input(s): AMMONIA in the last 168 hours. Coagulation profile  Recent Labs Lab 07/30/17 1914  INR 1.11    CBC:  Recent Labs Lab 07/29/17 0520 07/30/17 0832 07/30/17 1914 08/01/17 0718 08/02/17 0528  WBC 1.2* 0.8* 1.2* 0.8* 0.7*  NEUTROABS 0.9*  --  1.0*  --   --   HGB 6.0* 9.9* 7.7* 8.0* 7.7*  HCT 18.5* 29.3* 22.2* 23.5* 22.6*  MCV 62.5* 70.6* 70.3* 70.4* 69.8*  PLT 205 137* 97* 98* 112*   Cardiac Enzymes: No results for input(s): CKTOTAL, CKMB, CKMBINDEX, TROPONINI in the last 168 hours. BNP (last 3 results) No results  for input(s): PROBNP in the last 8760 hours. CBG: No results for input(s): GLUCAP in the last 168 hours. D-Dimer: No results for input(s): DDIMER in the last 72 hours. Hgb A1c: No results for input(s): HGBA1C in the last 72 hours. Lipid Profile: No results for input(s): CHOL, HDL, LDLCALC, TRIG, CHOLHDL, LDLDIRECT in the last 72 hours. Thyroid function studies: No results for input(s): TSH, T4TOTAL, T3FREE, THYROIDAB in the last 72 hours.  Invalid input(s): FREET3 Anemia work up: No results for input(s): VITAMINB12, FOLATE, FERRITIN, TIBC, IRON, RETICCTPCT in the last 72 hours. Sepsis Labs:  Recent Labs Lab 07/30/17 0832 07/30/17 1630 07/30/17 1914 07/30/17 2200 08/01/17 0718 08/02/17 0528  PROCALCITON  --   --  1.00  --   --   --   WBC 0.8*  --  1.2*  --  0.8* 0.7*  LATICACIDVEN  --  2.3* 0.8 0.5  --   --    Microbiology Recent Results (from the past 240 hour(s))  Culture, blood (Routine X 2) w Reflex to ID Panel     Status: None (Preliminary result)   Collection Time: 07/30/17 11:50 PM  Result Value Ref Range Status   Specimen Description BLOOD RIGHT ANTECUBITAL  Final   Special Requests   Final    BOTTLES DRAWN AEROBIC ONLY Blood Culture adequate volume   Culture   Final    NO GROWTH 3  DAYS Performed at [facility], [place] [address], [place], [place] [address]    Report Status PENDING  Incomplete  Culture, blood (x 2)     Status: None (Preliminary result)   Collection Time: 07/30/17 11:55 PM  Result Value Ref Range Status   Specimen Description BLOOD BLOOD LEFT HAND  Final   Special Requests IN PEDIATRIC BOTTLE Blood Culture adequate volume  Final   Culture   Final    NO GROWTH 3 DAYS Performed at [facility], [place] [address], [place], [place] [address]    Report Status PENDING  Incomplete  Culture, Urine     Status: Abnormal   Collection Time: 07/31/17  3:57 PM  Result Value Ref Range Status   Specimen Description URINE, RANDOM  Final   Special Requests NONE  Final   Culture (A)  Final    <10,000 COLONIES/mL INSIGNIFICANT GROWTH Performed at [facility], [place] [address], [place], [place] [address]    Report Status 08/02/2017 FINAL  Final     Medications:   . azithromycin  1,200 mg Oral Q Fri  . benztropine  1 mg Oral Daily  . elvitegravir-cobicistat-emtricitabine-tenofovir  1 tablet Oral Q breakfast  . feeding supplement  1 Container Oral TID BM  . feeding supplement (PRO-STAT SUGAR FREE 64)  30 mL Oral BID  . fluconazole  400 mg Oral Daily  . OLANZapine  5 mg Oral QHS  . pantoprazole  40 mg Oral BID  . senna-docusate  2 tablet Oral BID  . sucralfate  1 g Oral TID WC & HS  . sulfamethoxazole-trimethoprim  1 tablet Oral Once per day on Mon Wed Fri   Continuous Infusions:     LOS: 5 days   Charlynne Cousins  [facility] Pager [phone]  *Please refer to [email or web address], password [email or web address] to get updated schedule on who will round on this patient, as hospitalists switch teams weekly. If 7PM-7AM, please contact night-coverage at [email or web address], password [email or web address] for any overnight needs.  08/04/2017, 8:09 AM

## 2017-08-04 NOTE — Progress Notes (Signed)
[facility] for Infectious Disease    Date of Admission:  07/29/2017      ID: [name] is a 41 y.o. female with advanced hiv/aids admitted for RUQ abdominal pain/fever found to have biliary dsykinesia, improving Active Problems:   HIV disease ([place])   Microcytic anemia   Bipolar disorder (HCC)   Dysphagia   Asthma   GERD (gastroesophageal reflux disease)   Candidal esophagitis (HCC)   Severe protein-calorie malnutrition (HCC)   Abdominal pain   Acute renal failure (ARF) (HCC)   Leukopenia   Neutropenia with fever (HCC)   Acalculous cholecystitis   Bacteremia due to other bacteria   Neutropenic fever (HCC)   Acute kidney injury (nontraumatic) (HCC)   AKI (acute kidney injury) ([place])   Thrombocytopenia (HCC)    Subjective: No longer having diarrhea. Now having abdominal discomfort from gas. Eating is improved, not causing RUQ as much  Medications:  . azithromycin  1,200 mg Oral Q Fri  . [START ON 08/05/2017] benztropine  1 mg Oral QHS  . elvitegravir-cobicistat-emtricitabine-tenofovir  1 tablet Oral Q breakfast  . feeding supplement  1 Container Oral TID BM  . feeding supplement (PRO-STAT SUGAR FREE 64)  30 mL Oral BID  . fluconazole  400 mg Oral Daily  . OLANZapine  5 mg Oral QHS  . pantoprazole  40 mg Oral BID  . QUEtiapine  50 mg Oral QHS  . senna-docusate  2 tablet Oral BID  . sucralfate  1 g Oral TID WC & HS  . sulfamethoxazole-trimethoprim  1 tablet Oral Once per day on Mon Wed Fri    Objective: Vital signs in last 24 hours: Temp:  [98.2 F (36.8 C)-98.4 F (36.9 C)] 98.4 F (36.9 C) (08/20 1414) Pulse Rate:  [68-92] 92 (08/20 1414) Resp:  [16-18] 18 (08/20 1414) BP: (101-145)/(82-88) 101/82 (08/20 1414) SpO2:  [98 %-100 %] 100 % (08/20 1414) Weight:  [155 lb 6.4 oz (70.5 kg)] 155 lb 6.4 oz (70.5 kg) (08/20 0520) Physical Exam  Constitutional:  oriented to person, place, and time. appears cachetic and chronically ill. No distress.  HENT:  [place]/AT, PERRLA, no scleral icterus, mild thrush Mouth/Throat: Oropharynx is clear and moist. No oropharyngeal exudate.  Abdominal: Soft. Bowel sounds are normal.  Mildly distended Neurological: alert and oriented to person, place, and time.  Skin: Skin is warm and dry. No rash noted. No erythema.  Psychiatric: a normal mood and affect.  behavior is normal.    Lab Results  Recent Labs  08/02/17 0528  WBC 0.7*  HGB 7.7*  HCT 22.6*  NA 136  K 3.7  CL 112*  CO2 17*  BUN 23*  CREATININE 1.35*    Microbiology:  Studies/Results: No results found.   Assessment/Plan: hiv disease/aids = continue on genvoya daily. She is not interested in smaller pills or different regimen at this time. Agree that she needs to take it consistently and repeat viral load in 4 wk   RUQ pain = appears improved, continue to advance diet as tolerated. Work up suggests she has element of GB dysfunction but agree with surgery that at this time would not need cholecystectomy  Thrush = keep on fluconazole 200mg  po daily x 2 wk  oi proph = continue with bactrim SS daily(not DS) , possibly contributing to leukopenia. Continue with azithromycin 1200mg  weekly.      Baxter Flattery [facility] for Infectious Diseases Cell: [phone] Pager: [phone]  08/04/2017, 5:34 PM

## 2017-08-05 DIAGNOSIS — K219 Gastro-esophageal reflux disease without esophagitis: Secondary | ICD-10-CM

## 2017-08-05 LAB — CULTURE, BLOOD (ROUTINE X 2)
CULTURE: NO GROWTH
Culture: NO GROWTH
SPECIAL REQUESTS: ADEQUATE
Special Requests: ADEQUATE

## 2017-08-05 MED ORDER — TRAMADOL HCL 50 MG PO TABS: 50.0000 mg | ORAL_TABLET | Freq: Four times a day (QID) | ORAL | Status: DC | PRN

## 2017-08-05 MED ORDER — TRAMADOL HCL 50 MG PO TABS: 50.0000 mg | ORAL_TABLET | Freq: Four times a day (QID) | ORAL | 0 refills | Status: AC | PRN

## 2017-08-05 MED ORDER — AZITHROMYCIN 600 MG PO TABS: 1200.0000 mg | ORAL_TABLET | ORAL | 0 refills | Status: DC

## 2017-08-05 MED ORDER — SULFAMETHOXAZOLE-TRIMETHOPRIM 400-80 MG PO TABS: 1.0000 | ORAL_TABLET | Freq: Every day | ORAL | Status: DC

## 2017-08-05 MED ORDER — FLUCONAZOLE 100 MG PO TABS: 200.0000 mg | ORAL_TABLET | ORAL | 0 refills | Status: DC

## 2017-08-05 MED ORDER — TRAMADOL HCL 50 MG PO TABS
50.0000 mg | ORAL_TABLET | Freq: Four times a day (QID) | ORAL | Status: DC | PRN
  Administered 2017-08-05 (×2): 50 mg via ORAL
  Filled 2017-08-05 (×2): qty 1

## 2017-08-05 NOTE — Progress Notes (Signed)
Patient given discharge instructions, and verbalized an understanding of all discharge instructions.  Patient agrees with discharge plan, and is being discharged in stable medical condition.  Patient given transportation via wheelchair. 

## 2017-08-05 NOTE — Progress Notes (Signed)
Patient temp 100.3. Patient has no Tylenol ordered. Has multiple blankets on her and room is very warm. Advised to remove covers. Provider on call paged.

## 2017-08-05 NOTE — Progress Notes (Signed)
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 Will c/t monitor.

## 2017-08-05 NOTE — Discharge Summary (Signed)
Physician Discharge Summary  EMIYAH SPRAGGINS PPI:[id number] DOB: [birth year] DOA: 07/29/2017  PCP: Nolene Ebbs, MD  Admit date: 07/29/2017 Discharge date: 08/05/2017  Admitted From: home Disposition:  Home  Recommendations for Outpatient Follow-up:  1. Follow up with Infectious disease in 1-2 weeks 2. Please obtain BMP/CBC in one week 3. Check a viral load in 4 weeks.  Home Health:No Equipment/Devices:None  Discharge Condition:guarded CODE STATUS:full Diet recommendation: Heart Healthy   Brief/Interim Summary: 41 y.o. female past medical history of HIV noncompliant, dysphagia due to Candida, microcytic anemia hepatitis B bipolar disorder who presents to the was a long ED with chief complaint of right upper quadrant pain for 2 months associated with emesis and weight loss, she reports that her pain started on 04/30/2017 after her EGD with esophageal dilation for which she did not complete her Diflucan treatment. Since then she's been having dysphasia worse with solid foods and liquids made better with heating pad.  Discharge Diagnoses:  Active Problems:   HIV disease (HCC)   Microcytic anemia   Bipolar disorder (HCC)   Dysphagia   Asthma   GERD (gastroesophageal reflux disease)   Candidal esophagitis (HCC)   Severe protein-calorie malnutrition (HCC)   Abdominal pain   Acute renal failure (ARF) (HCC)   Leukopenia   Neutropenia with fever (HCC)   Acalculous cholecystitis   Bacteremia due to other bacteria   Neutropenic fever (HCC)   Acute kidney injury (nontraumatic) (HCC)   AKI (acute kidney injury) ([place])   Thrombocytopenia (HCC)  Acute on chronic right upper quadrant pain associated with emesis: Right upper quadrant ultrasound and chest x-ray should no acute findings, showing mild elevation of LFTs. Hepatobiliary scan showed a reduced ejection fraction surgery was consulted, he was a low likelihood of biliary disease causing her abdominal pain   Sepsis/neutropenic  fever: She was started empirically on IV antibiotics she was fluid resuscitated and her lactate improved. Culture data remain negative. ID was consulted and recommended blood cultures to rule out Mack.  HIV with neutropenia and medication noncompliance: Her CD4 count was less than 50 she was started on Bactrim and azithromycin prophylaxis. ID agree with plan of recommended to resume her home medications.  Esophageal stricture and stenosis with dysphagia in the setting of Candida X of otitis: Likely contributed to her abdominal pain, her Diflucan was increased per recommendations per ID and her abdominal pain improved proved. She will continue Diflucan at home.  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  GERD Continue Protonix and [name].  Hypokalemia: Likely due to emesis, this was repleted this resolve.  Acute kidney injury: With a baseline creatinine of less than 1, likely prerenal does resolve with IV fluid.  History of asthma: No changes were made to her medication.  Bipolar disorder: No changes were made to her regimen.  On anion gap metabolic acidosis: Likely due to renal disease, does resolve.  Severe protein include malnutrition: Malnutrition Type:  Nutrition Problem: Malnutrition (Severe) Etiology: chronic illness (HIV)   Malnutrition Characteristics:  Signs/Symptoms: energy intake < or equal to 50% for > or equal to 1 month, severe depletion of body fat, severe depletion of muscle mass   Nutrition Interventions:  Interventions: Boost Breeze, Prostat     Discharge Instructions  Discharge Instructions    Diet - low sodium heart healthy    Complete by:  As directed    Increase activity slowly  Complete by:  As directed      Allergies as of 08/05/2017      Reactions   Dapsone Itching, Rash   Other Hives, Itching   Berries, TREE NUTS   Peanut-containing  Drug Products Hives   Shellfish Allergy Hives   Bactrim Itching   Orange Fruit Itching   Sulfa Antibiotics Itching      Medication List    TAKE these medications   albuterol 108 (90 Base) MCG/ACT inhaler Commonly known as:  PROAIR HFA Inhale 2 puffs into the lungs every 4 (four) hours as needed for wheezing or shortness of breath (cough).   azithromycin 600 MG tablet Commonly known as:  ZITHROMAX Take 2 tablets (1,200 mg total) by mouth every Friday.   benztropine 1 MG tablet Commonly known as:  COGENTIN Take 1 tablet (1 mg total) by mouth daily.   elvitegravir-cobicistat-emtricitabine-tenofovir 150-150-200-10 MG Tabs tablet Commonly known as:  GENVOYA Take 1 tablet by mouth daily with breakfast.   EPINEPHrine 0.3 mg/0.3 mL Soaj injection Commonly known as:  EPI-PEN Inject 0.3 mLs (0.3 mg total) into the muscle once.   fluconazole 100 MG tablet Commonly known as:  DIFLUCAN Take 2 tablets (200 mg total) by mouth once a week. What changed:  how much to take   OLANZapine 5 MG tablet Commonly known as:  ZYPREXA Take 1 tablet (5 mg total) by mouth at bedtime.   pantoprazole 40 MG tablet Commonly known as:  PROTONIX Take 1 tablet (40 mg total) by mouth daily.   sucralfate 1 g tablet Commonly known as:  [name] Take 1 tablet (1 g total) by mouth 4 (four) times daily -  with meals and at bedtime.   sulfamethoxazole-trimethoprim 400-80 MG tablet Commonly known as:  BACTRIM,SEPTRA Take 1 tablet by mouth daily.   traMADol 50 MG tablet Commonly known as:  ULTRAM Take 1 tablet (50 mg total) by mouth every 6 (six) hours as needed for moderate pain.      Follow-up Information    Excell Seltzer, MD. Call in 2 week(s).   Specialty:  General Surgery Why:  to schedule an appointment to discuss having your gallbladder removed  Contact information: [address] [place] [address] [phone]          Allergies  Allergen Reactions  . Dapsone  Itching and Rash  . Other Hives and Itching    Berries, TREE NUTS  . Peanut-Containing Drug Products Hives  . Shellfish Allergy Hives  . Bactrim Itching  . Orange Fruit Itching  . Sulfa Antibiotics Itching    Consultations:  Infectious disease  Gen. surgery   Procedures/Studies: Dg Chest 2 View  Result Date: 07/29/2017 CLINICAL DATA:  Shortness of breath.  Hypoxia. EXAM: CHEST  2 VIEW COMPARISON:  12/05/2014 . FINDINGS: Mediastinum hilar structures normal. Lungs are clear. No pleural effusion or pneumothorax. Nipple shadows again noted. IMPRESSION: No active cardiopulmonary disease. Electronically Signed   By: Marcello Moores  Register   On: 07/29/2017 10:26   US Renal  Result Date: 07/30/2017 CLINICAL DATA:  Acute renal injury EXAM: RENAL / URINARY TRACT ULTRASOUND COMPLETE COMPARISON:  None. FINDINGS: Right Kidney: Length: 12.3 cm. Echogenicity within normal limits. No mass or hydronephrosis visualized. Left Kidney: Length: 11.9 cm. Echogenicity within normal limits. No mass or hydronephrosis visualized. Bladder: Appears normal for degree of bladder distention. Dependent debris layering in bladder. IMPRESSION: 1. Normal kidneys. 2. Dependent debris layering in the bladder. Electronically Signed   By: Helane Gunther.D.  On: 07/30/2017 13:43   Nm Hepato W/eject Fract  Result Date: 07/30/2017 CLINICAL DATA:  Hervey Ard right upper quadrant pain and nausea for 2 months. EXAM: NUCLEAR MEDICINE HEPATOBILIARY IMAGING WITH GALLBLADDER EF TECHNIQUE: Sequential images of the abdomen were obtained out to 60 minutes following intravenous administration of radiopharmaceutical. After oral ingestion of Ensure, gallbladder ejection fraction was determined. At 60 min, normal ejection fraction is greater than 33%. RADIOPHARMACEUTICALS:  5.2 mCi Tc-71m  Choletec IV COMPARISON:  Right upper quadrant ultrasound dated July 29, 2017. FINDINGS: Prompt uptake and biliary excretion of activity by the liver is seen.  Gallbladder activity is visualized, consistent with patency of cystic duct. Biliary activity passes into small bowel, consistent with patent common bile duct. Calculated gallbladder ejection fraction is 9.2%. (Normal gallbladder ejection fraction with Ensure is greater than 33%.) IMPRESSION: 1. Reduced gallbladder ejection fraction, measuring 9.2%. 2. Patent cystic and common bile ducts. Electronically Signed   By: Titus Dubin M.D.   On: 07/30/2017 16:08   Dg Chest Port 1 View  Result Date: 07/30/2017 CLINICAL DATA:  Sepsis and shortness of Breath EXAM: PORTABLE CHEST 1 VIEW COMPARISON:  07/29/2017 FINDINGS: The heart size and mediastinal contours are within normal limits. Both lungs are clear. The visualized skeletal structures are unremarkable. IMPRESSION: No active disease. Electronically Signed   By: Inez Catalina M.D.   On: 07/30/2017 17:55   US Abdomen Limited Ruq  Result Date: 07/29/2017 CLINICAL DATA:  Abdominal pain for 2 months. EXAM: ULTRASOUND ABDOMEN LIMITED RIGHT UPPER QUADRANT COMPARISON:  None. FINDINGS: Gallbladder: No gallstones or wall thickening visualized. No sonographic Murphy sign noted by sonographer. Common bile duct: Diameter: 2.6 mm, normal. Liver: No focal lesion identified. Within normal limits in parenchymal echogenicity. IMPRESSION: Normal exam. Electronically Signed   By: Lorriane Shire M.D.   On: 07/29/2017 10:12     Subjective: No new complaints she is tolerating her diet free of abdominal pain.  Discharge Exam: Vitals:   08/05/17 0625 08/05/17 0937  BP: 127/84   Pulse: 88   Resp: 18   Temp: 100.3 F (37.9 C) 99.8 F (37.7 C)  SpO2: 100%    Vitals:   08/04/17 1414 08/04/17 2042 08/05/17 0625 08/05/17 0937  BP: 101/82 (!) 138/91 127/84   Pulse: 92 73 88   Resp: 18 18 18    Temp: 98.4 F (36.9 C) 98.5 F (36.9 C) 100.3 F (37.9 C) 99.8 F (37.7 C)  TempSrc: Oral Oral Oral Oral  SpO2: 100% 100% 100%   Weight:      Height:        General: Pt  is alert, awake, not in acute distress Cardiovascular: RRR, S1/S2 +, no rubs, no gallops Respiratory: CTA bilaterally, no wheezing, no rhonchi Abdominal: Soft, NT, ND, bowel sounds + Extremities: no edema, no cyanosis    The results of significant diagnostics from this hospitalization (including imaging, microbiology, ancillary and laboratory) are listed below for reference.     Microbiology: Recent Results (from the past 240 hour(s))  Culture, blood (Routine X 2) w Reflex to ID Panel     Status: None (Preliminary result)   Collection Time: 07/30/17 11:50 PM  Result Value Ref Range Status   Specimen Description BLOOD RIGHT ANTECUBITAL  Final   Special Requests   Final    BOTTLES DRAWN AEROBIC ONLY Blood Culture adequate volume   Culture   Final    NO GROWTH 4 DAYS Performed at [facility], [address], [place], [place] [address]  Report Status PENDING  Incomplete  Culture, blood (x 2)     Status: None (Preliminary result)   Collection Time: 07/30/17 11:55 PM  Result Value Ref Range Status   Specimen Description BLOOD BLOOD LEFT HAND  Final   Special Requests IN PEDIATRIC BOTTLE Blood Culture adequate volume  Final   Culture   Final    NO GROWTH 4 DAYS Performed at [facility], [place] [address], [place], [place] [address]    Report Status PENDING  Incomplete  Culture, Urine     Status: Abnormal   Collection Time: 07/31/17  3:57 PM  Result Value Ref Range Status   Specimen Description URINE, RANDOM  Final   Special Requests NONE  Final   Culture (A)  Final    <10,000 COLONIES/mL INSIGNIFICANT GROWTH Performed at [facility], [place] [address], [place], [place] [address]    Report Status 08/02/2017 FINAL  Final     Labs: BNP (last 3 results) No results for input(s): BNP in the last 8760 hours. Basic Metabolic Panel:  Recent Labs Lab 07/30/17 0832 07/31/17 0005 07/31/17 0810 08/01/17 0718 08/02/17 0528  NA 136 137 137 133* 136  K 3.2* 2.9*  3.2* 3.5 3.7  CL 107 113* 111 109 112*  CO2 22 16* 16* 16* 17*  GLUCOSE 101* 94 93 105* 117*  BUN 28* 25* 26* 27* 23*  CREATININE 1.81* 1.69* 1.89* 1.72* 1.35*  CALCIUM 8.1* 7.4* 7.7* 7.7* 7.5*   Liver Function Tests:  Recent Labs Lab 07/30/17 0832 07/31/17 0005 07/31/17 0810  AST 70* 91* 98*  ALT 33 33 36  ALKPHOS 161* 195* 190*  BILITOT 1.3* 1.2 1.0  PROT 5.8* 4.7* 5.0*  ALBUMIN 2.6* 2.1* 2.2*   No results for input(s): LIPASE, AMYLASE in the last 168 hours. No results for input(s): AMMONIA in the last 168 hours. CBC:  Recent Labs Lab 07/30/17 0832 07/30/17 1914 08/01/17 0718 08/02/17 0528  WBC 0.8* 1.2* 0.8* 0.7*  NEUTROABS  --  1.0*  --   --   HGB 9.9* 7.7* 8.0* 7.7*  HCT 29.3* 22.2* 23.5* 22.6*  MCV 70.6* 70.3* 70.4* 69.8*  PLT 137* 97* 98* 112*   Cardiac Enzymes: No results for input(s): CKTOTAL, CKMB, CKMBINDEX, TROPONINI in the last 168 hours. BNP: Invalid input(s): POCBNP CBG: No results for input(s): GLUCAP in the last 168 hours. D-Dimer No results for input(s): DDIMER in the last 72 hours. Hgb A1c No results for input(s): HGBA1C in the last 72 hours. Lipid Profile No results for input(s): CHOL, HDL, LDLCALC, TRIG, CHOLHDL, LDLDIRECT in the last 72 hours. Thyroid function studies No results for input(s): TSH, T4TOTAL, T3FREE, THYROIDAB in the last 72 hours.  Invalid input(s): FREET3 Anemia work up No results for input(s): VITAMINB12, FOLATE, FERRITIN, TIBC, IRON, RETICCTPCT in the last 72 hours. Urinalysis    Component Value Date/Time   COLORURINE YELLOW 07/29/2017 1357   APPEARANCEUR HAZY (A) 07/29/2017 1357   LABSPEC 1.009 07/29/2017 1357   PHURINE 6.0 07/29/2017 1357   GLUCOSEU NEGATIVE 07/29/2017 1357   HGBUR LARGE (A) 07/29/2017 1357   BILIRUBINUR NEGATIVE 07/29/2017 1357   KETONESUR 5 (A) 07/29/2017 1357   PROTEINUR 100 (A) 07/29/2017 1357   UROBILINOGEN 0.2 12/07/2014 1441   NITRITE NEGATIVE 07/29/2017 1357   LEUKOCYTESUR LARGE  (A) 07/29/2017 1357   Sepsis Labs Invalid input(s): PROCALCITONIN,  WBC,  LACTICIDVEN Microbiology Recent Results (from the past 240 hour(s))  Culture, blood (Routine X 2) w Reflex to  ID Panel     Status: None (Preliminary result)   Collection Time: 07/30/17 11:50 PM  Result Value Ref Range Status   Specimen Description BLOOD RIGHT ANTECUBITAL  Final   Special Requests   Final    BOTTLES DRAWN AEROBIC ONLY Blood Culture adequate volume   Culture   Final    NO GROWTH 4 DAYS Performed at [facility], [address], [place], [place] [address]    Report Status PENDING  Incomplete  Culture, blood (x 2)     Status: None (Preliminary result)   Collection Time: 07/30/17 11:55 PM  Result Value Ref Range Status   Specimen Description BLOOD BLOOD LEFT HAND  Final   Special Requests IN PEDIATRIC BOTTLE Blood Culture adequate volume  Final   Culture   Final    NO GROWTH 4 DAYS Performed at [facility], [place] [address] [place], [place] [address]    Report Status PENDING  Incomplete  Culture, Urine     Status: Abnormal   Collection Time: 07/31/17  3:57 PM  Result Value Ref Range Status   Specimen Description URINE, RANDOM  Final   Special Requests NONE  Final   Culture (A)  Final    <10,000 COLONIES/mL INSIGNIFICANT GROWTH Performed at [facility], [place] [address] [place], [place] [address]    Report Status 08/02/2017 FINAL  Final     Time coordinating discharge: Over 30 minutes  SIGNED:   Charlynne Cousins, MD  [facility] 08/05/2017, 9:51 AM Pager   If 7PM-7AM, please contact night-coverage [email or web address] Password [email or web address]

## 2017-08-05 NOTE — Progress Notes (Signed)
Date: August 05, 2017 Chart reviewed for discharge orders: None found for case management. Vernia Buff, [phone]

## 2017-08-06 LAB — CMV DNA, QUANTITATIVE, PCR
CMV DNA QUANT: POSITIVE [IU]/mL
LOG10 CMV QN DNA PL: UNDETERMINED {Log_IU}/mL

## 2017-08-27 ENCOUNTER — Inpatient Hospital Stay: Payer: Medicaid Other | Admitting: Internal Medicine

## 2017-09-03 ENCOUNTER — Encounter: Payer: Self-pay | Admitting: Internal Medicine

## 2017-09-03 ENCOUNTER — Ambulatory Visit (INDEPENDENT_AMBULATORY_CARE_PROVIDER_SITE_OTHER): Payer: Medicaid Other | Admitting: Internal Medicine

## 2017-09-03 VITALS — BP 105/71 | HR 103 | Temp 99.1°F | Wt 127.0 lb

## 2017-09-03 DIAGNOSIS — B20 Human immunodeficiency virus [HIV] disease: Secondary | ICD-10-CM | POA: Diagnosis not present

## 2017-09-03 DIAGNOSIS — R110 Nausea: Secondary | ICD-10-CM

## 2017-09-03 DIAGNOSIS — B3781 Candidal esophagitis: Secondary | ICD-10-CM | POA: Diagnosis not present

## 2017-09-03 DIAGNOSIS — K219 Gastro-esophageal reflux disease without esophagitis: Secondary | ICD-10-CM

## 2017-09-03 MED ORDER — ONDANSETRON 8 MG PO TBDP: 8.0000 mg | ORAL_TABLET | Freq: Three times a day (TID) | ORAL | 3 refills | Status: AC | PRN

## 2017-09-03 MED ORDER — ATOVAQUONE 750 MG/5ML PO SUSP: 1500.0000 mg | Freq: Every day | ORAL | 11 refills | Status: DC

## 2017-09-03 NOTE — Progress Notes (Signed)
RFV: hiv disease and hospital follow up  Patient ID: [name], female   DOB: [birth year], 41 y.o.   MRN: [id number]  HPI Maycie is a 41yo F with long standing hiv disease, poorly controlled in part has gi symptoms that cause poor adherence. Cd 4 countof 10/ VL 850K. She is being seen by Dr  Excell Seltzer for evaluation for elective cholecystectomy which may improve her symptoms. She has been taking meds consistently in the last 2 wk she states. She has stopped taking bactrim since it causes nausea as well as pruritic rash. She also is not taking azithromycin weekly since it causes significant nausea.   She has noticed gaining some weight of roughly 2 lb in the last 2 wk .   Outpatient Encounter Prescriptions as of 09/03/2017  Medication Sig  . albuterol (PROAIR HFA) 108 (90 BASE) MCG/ACT inhaler Inhale 2 puffs into the lungs every 4 (four) hours as needed for wheezing or shortness of breath (cough).  . benztropine (COGENTIN) 1 MG tablet Take 1 tablet (1 mg total) by mouth daily.  Marland Kitchen elvitegravir-cobicistat-emtricitabine-tenofovir (GENVOYA) 150-150-200-10 MG TABS tablet Take 1 tablet by mouth daily with breakfast.  . fluconazole (DIFLUCAN) 100 MG tablet Take 2 tablets (200 mg total) by mouth once a week.  Marland Kitchen OLANZapine (ZYPREXA) 5 MG tablet Take 1 tablet (5 mg total) by mouth at bedtime.  . pantoprazole (PROTONIX) 40 MG tablet Take 1 tablet (40 mg total) by mouth daily.  . traMADol (ULTRAM) 50 MG tablet Take 1 tablet (50 mg total) by mouth every 6 (six) hours as needed for moderate pain.  Marland Kitchen azithromycin (ZITHROMAX) 600 MG tablet Take 2 tablets (1,200 mg total) by mouth every Friday. (Patient not taking: Reported on 09/03/2017)  . EPINEPHrine 0.3 mg/0.3 mL IJ SOAJ injection Inject 0.3 mLs (0.3 mg total) into the muscle once. (Patient not taking: Reported on 11/12/2016)  . sucralfate ([name]) 1 g tablet Take 1 tablet (1 g total) by mouth 4 (four) times daily -  with meals and at bedtime.  .  sulfamethoxazole-trimethoprim (BACTRIM,SEPTRA) 400-80 MG tablet Take 1 tablet by mouth daily. (Patient not taking: Reported on 09/03/2017)   No facility-administered encounter medications on file as of 09/03/2017.      Patient Active Problem List   Diagnosis Date Noted  . Thrombocytopenia ([place]) 07/31/2017  . Neutropenia with fever ([place]) 07/30/2017  . Acalculous cholecystitis 07/30/2017  . Bacteremia due to other bacteria 07/30/2017  . Neutropenic fever ([place]) 07/30/2017  . Acute kidney injury (nontraumatic) ([place])   . AKI (acute kidney injury) ([place])   . Abdominal pain 07/29/2017  . Acute renal failure (ARF) ([place]) 07/29/2017  . Leukopenia 07/29/2017  . Severe protein-calorie malnutrition ([place]) 10/26/2016  . Status post thyroidectomy 10/25/2016  . Asthma 10/25/2016  . 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 GERD (gastroesophageal reflux disease) 10/25/2016  . Candidal esophagitis ([place]) 10/25/2016  . Intractable vomiting with nausea 10/24/2016  . Normal anion gap metabolic acidosis 10/09/8526  . Dysphagia 09/11/2015  . Noncompliance with medication regimen   . Esophageal stricture 12/06/2014  . 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 Hypokalemia 07/09/2013  . Bipolar disorder ([place]) 04/01/2012  . HIV disease ([place]) 02/28/2009  . Genital herpes 02/28/2009  . Microcytic anemia 02/27/2009     Health Maintenance Due  Topic Date Due  . TETANUS/TDAP  07/27/1996  . PAP SMEAR  01/07/2017  . INFLUENZA VACCINE  07/16/2017     Review of Systems Nausea, with some vomiting. Has abdominal  pain, epigastric areas when she eats, anorexia, No diarrhea Physical Exam   BP 105/71   Pulse (!) 103   Temp 99.1 F (37.3 C) (Oral)   Wt 127 lb (57.6 kg)   LMP 08/30/2017 (Approximate)   BMI 17.22 kg/m   Physical Exam  Constitutional:  oriented to person, place, and time. appears chronically ill, facial wasting. No distress.  HENT: [place]/AT, PERRLA, no scleral icterus Mouth/Throat:  Oropharynx is clear and moist. No oropharyngeal exudate.  Cardiovascular: Normal rate, regular rhythm and normal heart sounds. Exam reveals no gallop and no friction rub.  No murmur heard.  Pulmonary/Chest: Effort normal and breath sounds normal. No respiratory distress.  has no wheezes.  Neck = supple, no nuchal rigidity Abdominal: Soft. Bowel sounds are normal.  exhibits no distension. There is no tenderness.  Lymphadenopathy: no cervical adenopathy. No axillary adenopathy Neurological: alert and oriented to person, place, and time.  Skin: Skin is warm and dry. No rash noted. No erythema.  Psychiatric: a normal mood and affect.  behavior is normal.   Lab Results  Component Value Date   CD4TCELL 5 (L) 07/29/2017   Lab Results  Component Value Date   CD4TABS 10 (L) 07/29/2017   CD4TABS 20 (L) 11/12/2016   CD4TABS 10 (L) 10/24/2016   Lab Results  Component Value Date   HIV1RNAQUANT 849,000 07/29/2017   Lab Results  Component Value Date   HEPBSAB NEGATIVE 12/28/2013   Lab Results  Component Value Date   LABRPR NON REAC 01/31/2015    CBC Lab Results  Component Value Date   WBC 0.7 (LL) 08/02/2017   RBC 3.24 (L) 08/02/2017   HGB 7.7 (L) 08/02/2017   HCT 22.6 (L) 08/02/2017   PLT 112 (L) 08/02/2017   MCV 69.8 (L) 08/02/2017   MCH 23.8 (L) 08/02/2017   MCHC 34.1 08/02/2017   RDW 23.2 (H) 08/02/2017   LYMPHSABS 0.1 (L) 07/30/2017   MONOABS 0.1 07/30/2017   EOSABS 0.0 07/30/2017    BMET Lab Results  Component Value Date   NA 136 08/02/2017   K 3.7 08/02/2017   CL 112 (H) 08/02/2017   CO2 17 (L) 08/02/2017   GLUCOSE 117 (H) 08/02/2017   BUN 23 (H) 08/02/2017   CREATININE 1.35 (H) 08/02/2017   CALCIUM 7.5 (L) 08/02/2017   GFRNONAA 48 (L) 08/02/2017   GFRAA 56 (L) 08/02/2017      Assessment and Plan  hiv disease = she has sporadic adherence, concern that she has developed mutations. Will check cd 4 count and viral load with -  hiv genotype - will try to  premedicate with zofran prior to taking her hiv meds - she can go ahead with getting cholecystectomy if her counts look somewhat improved.   oi proph = discussed the importance that she is at risk for getting oi if not taking pcp proph or mac proph. She is will to start taking atovaquone. Will discuss azithro  Nausea = will start scheduled zofran  eso candidiasis = continue with fluconazole, but appears improved

## 2017-09-03 NOTE — Patient Instructions (Signed)
   For genvoya -   Take it 2 hrs before your carafate    For the mepron (yellow liquid) - mix with honey  * but premedicate with taking zofran (one pill beneath your tongue) 30 minutes before taking the mepron

## 2017-09-05 LAB — T-HELPER CELL (CD4) - (RCID CLINIC ONLY)
CD4 T CELL ABS: 20 /uL — AB (ref 400–2700)
CD4 T CELL HELPER: 7 % — AB (ref 33–55)

## 2017-09-08 ENCOUNTER — Other Ambulatory Visit: Payer: Self-pay | Admitting: Pharmacist

## 2017-09-08 ENCOUNTER — Telehealth: Payer: Self-pay | Admitting: Pharmacist

## 2017-09-08 NOTE — Telephone Encounter (Signed)
Patient's recent HIV viral load way up to 4.4 million copies/mL.  Called patient to discuss adherence and schedule an appointment with pharmacy but no answer.  I left a generic voicemail.  Will try again tomorrow.

## 2017-09-16 ENCOUNTER — Encounter: Payer: Self-pay | Admitting: Internal Medicine

## 2017-09-16 LAB — HIV-1 RNA ULTRAQUANT REFLEX TO GENTYP+
HIV 1 RNA QUANT: 4400000 {copies}/mL — AB
HIV-1 RNA QUANT, LOG: 6.64 {Log_copies}/mL — AB

## 2017-09-16 LAB — RFLX HIV-1 INTEGRASE GENOTYPE: HIV-1 Genotype: DETECTED — AB

## 2017-09-18 ENCOUNTER — Telehealth: Payer: Self-pay | Admitting: Pharmacist

## 2017-09-18 NOTE — Telephone Encounter (Signed)
Spoke to [employer] today. She saw Dr. Baxter Flattery a few weeks ago and her HIV viral load was 4.4 million. Discussed this with her on the phone and she admits that the last month or so she has only taken Genvoya around 4 times per week. She states it gets stuck in her throat when she is swallowing it and it either gets lodged in her throat or she throws it back up.  She is also having trouble on the atovaquone and is requesting to go back to dapsone.  She said she would rather have a rash and be itchy than swallow the liquid.  I counseled her on the issue of taking Genvoya only 4 times/week.  I'm nervous she has resistance now.  I told her to stop taking the Genvoya all together. She is coming to see me next week so we can get resistance labs and figure out a new regimen for her. I told her I would discuss with Dr. Baxter Flattery regarding her atovaquone vs dapsone and we would discuss at her appointment next week.

## 2017-09-24 ENCOUNTER — Ambulatory Visit: Payer: Medicaid Other

## 2017-10-12 ENCOUNTER — Emergency Department (HOSPITAL_COMMUNITY): Payer: Medicaid Other

## 2017-10-12 ENCOUNTER — Encounter (HOSPITAL_COMMUNITY): Payer: Self-pay | Admitting: Emergency Medicine

## 2017-10-12 DIAGNOSIS — R590 Localized enlarged lymph nodes: Secondary | ICD-10-CM | POA: Diagnosis present

## 2017-10-12 DIAGNOSIS — Z91018 Allergy to other foods: Secondary | ICD-10-CM

## 2017-10-12 DIAGNOSIS — A419 Sepsis, unspecified organism: Principal | ICD-10-CM | POA: Diagnosis present

## 2017-10-12 DIAGNOSIS — I10 Essential (primary) hypertension: Secondary | ICD-10-CM | POA: Diagnosis present

## 2017-10-12 DIAGNOSIS — K811 Chronic cholecystitis: Secondary | ICD-10-CM | POA: Diagnosis present

## 2017-10-12 DIAGNOSIS — R161 Splenomegaly, not elsewhere classified: Secondary | ICD-10-CM | POA: Diagnosis present

## 2017-10-12 DIAGNOSIS — F319 Bipolar disorder, unspecified: Secondary | ICD-10-CM | POA: Diagnosis present

## 2017-10-12 DIAGNOSIS — Z91010 Allergy to peanuts: Secondary | ICD-10-CM

## 2017-10-12 DIAGNOSIS — E890 Postprocedural hypothyroidism: Secondary | ICD-10-CM | POA: Diagnosis present

## 2017-10-12 DIAGNOSIS — Z79891 Long term (current) use of opiate analgesic: Secondary | ICD-10-CM

## 2017-10-12 DIAGNOSIS — R1319 Other dysphagia: Secondary | ICD-10-CM | POA: Diagnosis present

## 2017-10-12 DIAGNOSIS — Z9049 Acquired absence of other specified parts of digestive tract: Secondary | ICD-10-CM

## 2017-10-12 DIAGNOSIS — K210 Gastro-esophageal reflux disease with esophagitis: Secondary | ICD-10-CM | POA: Diagnosis present

## 2017-10-12 DIAGNOSIS — R64 Cachexia: Secondary | ICD-10-CM | POA: Diagnosis present

## 2017-10-12 DIAGNOSIS — Z888 Allergy status to other drugs, medicaments and biological substances status: Secondary | ICD-10-CM

## 2017-10-12 DIAGNOSIS — Z883 Allergy status to other anti-infective agents status: Secondary | ICD-10-CM

## 2017-10-12 DIAGNOSIS — R5081 Fever presenting with conditions classified elsewhere: Secondary | ICD-10-CM | POA: Diagnosis present

## 2017-10-12 DIAGNOSIS — Z79899 Other long term (current) drug therapy: Secondary | ICD-10-CM

## 2017-10-12 DIAGNOSIS — B1910 Unspecified viral hepatitis B without hepatic coma: Secondary | ICD-10-CM | POA: Diagnosis present

## 2017-10-12 DIAGNOSIS — Z681 Body mass index (BMI) 19 or less, adult: Secondary | ICD-10-CM

## 2017-10-12 DIAGNOSIS — E860 Dehydration: Secondary | ICD-10-CM | POA: Diagnosis present

## 2017-10-12 DIAGNOSIS — D509 Iron deficiency anemia, unspecified: Secondary | ICD-10-CM | POA: Diagnosis present

## 2017-10-12 DIAGNOSIS — Z79890 Hormone replacement therapy (postmenopausal): Secondary | ICD-10-CM

## 2017-10-12 DIAGNOSIS — D573 Sickle-cell trait: Secondary | ICD-10-CM | POA: Diagnosis present

## 2017-10-12 DIAGNOSIS — Z825 Family history of asthma and other chronic lower respiratory diseases: Secondary | ICD-10-CM

## 2017-10-12 DIAGNOSIS — B370 Candidal stomatitis: Secondary | ICD-10-CM | POA: Diagnosis present

## 2017-10-12 DIAGNOSIS — B20 Human immunodeficiency virus [HIV] disease: Secondary | ICD-10-CM | POA: Diagnosis present

## 2017-10-12 DIAGNOSIS — F419 Anxiety disorder, unspecified: Secondary | ICD-10-CM | POA: Diagnosis present

## 2017-10-12 DIAGNOSIS — Z91013 Allergy to seafood: Secondary | ICD-10-CM

## 2017-10-12 DIAGNOSIS — Z23 Encounter for immunization: Secondary | ICD-10-CM

## 2017-10-12 DIAGNOSIS — Z881 Allergy status to other antibiotic agents status: Secondary | ICD-10-CM

## 2017-10-12 DIAGNOSIS — E43 Unspecified severe protein-calorie malnutrition: Secondary | ICD-10-CM | POA: Diagnosis present

## 2017-10-12 DIAGNOSIS — Z9119 Patient's noncompliance with other medical treatment and regimen: Secondary | ICD-10-CM

## 2017-10-12 DIAGNOSIS — K828 Other specified diseases of gallbladder: Secondary | ICD-10-CM | POA: Diagnosis present

## 2017-10-12 DIAGNOSIS — E876 Hypokalemia: Secondary | ICD-10-CM | POA: Diagnosis present

## 2017-10-12 LAB — I-STAT TROPONIN, ED: Troponin i, poc: 0.01 ng/mL (ref 0.00–0.08)

## 2017-10-12 NOTE — ED Triage Notes (Signed)
Pt c/o 9/10 bilateral cp and upper back pain for the past few days, per family pt is no eating well at home, pt is no cooperative to assessment on triage.

## 2017-10-13 ENCOUNTER — Observation Stay (HOSPITAL_COMMUNITY): Payer: Medicaid Other

## 2017-10-13 ENCOUNTER — Encounter (HOSPITAL_COMMUNITY): Payer: Self-pay | Admitting: Internal Medicine

## 2017-10-13 ENCOUNTER — Inpatient Hospital Stay (HOSPITAL_COMMUNITY)
Admission: EM | Admit: 2017-10-13 | Discharge: 2017-10-24 | DRG: 974 | Disposition: A | Discharge status: Home / Self Care | Payer: Medicaid Other | Attending: Internal Medicine | Admitting: Internal Medicine

## 2017-10-13 ENCOUNTER — Other Ambulatory Visit (HOSPITAL_COMMUNITY): Payer: Medicaid Other

## 2017-10-13 DIAGNOSIS — D649 Anemia, unspecified: Secondary | ICD-10-CM

## 2017-10-13 DIAGNOSIS — B20 Human immunodeficiency virus [HIV] disease: Secondary | ICD-10-CM

## 2017-10-13 DIAGNOSIS — R112 Nausea with vomiting, unspecified: Secondary | ICD-10-CM

## 2017-10-13 DIAGNOSIS — D509 Iron deficiency anemia, unspecified: Secondary | ICD-10-CM

## 2017-10-13 DIAGNOSIS — D61818 Other pancytopenia: Secondary | ICD-10-CM | POA: Diagnosis present

## 2017-10-13 DIAGNOSIS — R0789 Other chest pain: Secondary | ICD-10-CM

## 2017-10-13 DIAGNOSIS — R52 Pain, unspecified: Secondary | ICD-10-CM

## 2017-10-13 DIAGNOSIS — K828 Other specified diseases of gallbladder: Secondary | ICD-10-CM

## 2017-10-13 DIAGNOSIS — E890 Postprocedural hypothyroidism: Secondary | ICD-10-CM

## 2017-10-13 DIAGNOSIS — R5081 Fever presenting with conditions classified elsewhere: Secondary | ICD-10-CM

## 2017-10-13 DIAGNOSIS — Z9889 Other specified postprocedural states: Secondary | ICD-10-CM

## 2017-10-13 DIAGNOSIS — R509 Fever, unspecified: Secondary | ICD-10-CM

## 2017-10-13 DIAGNOSIS — E876 Hypokalemia: Secondary | ICD-10-CM | POA: Diagnosis not present

## 2017-10-13 DIAGNOSIS — D709 Neutropenia, unspecified: Secondary | ICD-10-CM

## 2017-10-13 DIAGNOSIS — B3781 Candidal esophagitis: Secondary | ICD-10-CM

## 2017-10-13 LAB — BASIC METABOLIC PANEL
ANION GAP: 12 (ref 5–15)
Anion gap: 13 (ref 5–15)
BUN: 13 mg/dL (ref 6–20)
BUN: 15 mg/dL (ref 6–20)
CHLORIDE: 108 mmol/L (ref 101–111)
CHLORIDE: 99 mmol/L — AB (ref 101–111)
CO2: 18 mmol/L — ABNORMAL LOW (ref 22–32)
CO2: 23 mmol/L (ref 22–32)
Calcium: 8.4 mg/dL — ABNORMAL LOW (ref 8.9–10.3)
Calcium: 9.3 mg/dL (ref 8.9–10.3)
Creatinine, Ser: 0.79 mg/dL (ref 0.44–1.00)
Creatinine, Ser: 0.93 mg/dL (ref 0.44–1.00)
GFR calc Af Amer: >60 mL/min (ref >60)
GFR calc non Af Amer: >60 mL/min (ref >60)
Glucose, Bld: 81 mg/dL (ref 65–99)
Glucose, Bld: 96 mg/dL (ref 65–99)
POTASSIUM: 2.6 mmol/L — AB (ref 3.5–5.1)
POTASSIUM: 3.4 mmol/L — AB (ref 3.5–5.1)
SODIUM: 135 mmol/L (ref 135–145)
SODIUM: 138 mmol/L (ref 135–145)

## 2017-10-13 LAB — HEPATIC FUNCTION PANEL
ALT: 14 U/L (ref 14–54)
AST: 34 U/L (ref 15–41)
Albumin: 3.2 g/dL — ABNORMAL LOW (ref 3.5–5.0)
Alkaline Phosphatase: 221 U/L — ABNORMAL HIGH (ref 38–126)
BILIRUBIN DIRECT: 0.3 mg/dL (ref 0.1–0.5)
BILIRUBIN INDIRECT: 0.9 mg/dL (ref 0.3–0.9)
TOTAL PROTEIN: 7.4 g/dL (ref 6.5–8.1)
Total Bilirubin: 1.2 mg/dL (ref 0.3–1.2)

## 2017-10-13 LAB — RAPID URINE DRUG SCREEN, HOSP PERFORMED
Amphetamines: NOT DETECTED
BENZODIAZEPINES: NOT DETECTED
Barbiturates: NOT DETECTED
COCAINE: NOT DETECTED
OPIATES: POSITIVE — AB
Tetrahydrocannabinol: NOT DETECTED

## 2017-10-13 LAB — CBC
HEMATOCRIT: 22.7 % — AB (ref 36.0–46.0)
HEMATOCRIT: 27.8 % — AB (ref 36.0–46.0)
HEMOGLOBIN: 6.8 g/dL — AB (ref 12.0–15.0)
HEMOGLOBIN: 8.7 g/dL — AB (ref 12.0–15.0)
MCH: 20.8 pg — ABNORMAL LOW (ref 26.0–34.0)
MCH: 23.6 pg — ABNORMAL LOW (ref 26.0–34.0)
MCHC: 30 g/dL (ref 30.0–36.0)
MCHC: 31.3 g/dL (ref 30.0–36.0)
MCV: 69.4 fL — AB (ref 78.0–100.0)
MCV: 75.5 fL — ABNORMAL LOW (ref 78.0–100.0)
PLATELETS: 256 10*3/uL (ref 150–400)
Platelets: 211 10*3/uL (ref 150–400)
RBC: 3.27 MIL/uL — AB (ref 3.87–5.11)
RBC: 3.68 MIL/uL — AB (ref 3.87–5.11)
RDW: 19 % — ABNORMAL HIGH (ref 11.5–15.5)
RDW: 20.6 % — ABNORMAL HIGH (ref 11.5–15.5)
WBC: 1.5 10*3/uL — ABNORMAL LOW (ref 4.0–10.5)
WBC: 2.1 10*3/uL — AB (ref 4.0–10.5)

## 2017-10-13 LAB — MAGNESIUM: Magnesium: 1.9 mg/dL (ref 1.7–2.4)

## 2017-10-13 LAB — TROPONIN I

## 2017-10-13 LAB — LIPASE, BLOOD: Lipase: 45 U/L (ref 11–51)

## 2017-10-13 LAB — PREPARE RBC (CROSSMATCH)

## 2017-10-13 MED ORDER — HYDROMORPHONE HCL 1 MG/ML IJ SOLN
0.5000 mg | INTRAMUSCULAR | Status: AC | PRN
  Administered 2017-10-13 (×3): 0.5 mg via INTRAVENOUS
  Filled 2017-10-13 (×3): qty 1

## 2017-10-13 MED ORDER — ONDANSETRON HCL 4 MG/2ML IJ SOLN
4.0000 mg | Freq: Four times a day (QID) | INTRAMUSCULAR | Status: DC | PRN
  Administered 2017-10-15 – 2017-10-23 (×6): 4 mg via INTRAVENOUS
  Filled 2017-10-13 (×6): qty 2

## 2017-10-13 MED ORDER — SODIUM CHLORIDE 0.9 % IV BOLUS (SEPSIS)
1000.0000 mL | Freq: Once | INTRAVENOUS | Status: AC
  Administered 2017-10-13: 1000 mL via INTRAVENOUS

## 2017-10-13 MED ORDER — ACETAMINOPHEN 325 MG PO TABS
650.0000 mg | ORAL_TABLET | Freq: Four times a day (QID) | ORAL | Status: DC | PRN
  Administered 2017-10-19: 650 mg via ORAL
  Filled 2017-10-13 (×4): qty 2

## 2017-10-13 MED ORDER — POTASSIUM CHLORIDE 10 MEQ/100ML IV SOLN
10.0000 meq | INTRAVENOUS | Status: AC
  Administered 2017-10-13 (×4): 10 meq via INTRAVENOUS
  Filled 2017-10-13 (×4): qty 100

## 2017-10-13 MED ORDER — TRAMADOL HCL 50 MG PO TABS
50.0000 mg | ORAL_TABLET | Freq: Four times a day (QID) | ORAL | Status: DC | PRN
  Administered 2017-10-13 – 2017-10-23 (×5): 50 mg via ORAL
  Filled 2017-10-13 (×6): qty 1

## 2017-10-13 MED ORDER — SUCRALFATE 1 G PO TABS
1.0000 g | ORAL_TABLET | Freq: Three times a day (TID) | ORAL | Status: DC
  Administered 2017-10-13 (×2): 1 g via ORAL
  Filled 2017-10-13 (×3): qty 1

## 2017-10-13 MED ORDER — KETOROLAC TROMETHAMINE 15 MG/ML IJ SOLN
15.0000 mg | Freq: Once | INTRAMUSCULAR | Status: AC
  Administered 2017-10-13: 15 mg via INTRAVENOUS
  Filled 2017-10-13: qty 1

## 2017-10-13 MED ORDER — HYDROCODONE-ACETAMINOPHEN 5-325 MG PO TABS
2.0000 | ORAL_TABLET | Freq: Once | ORAL | Status: AC
  Administered 2017-10-13: 2 via ORAL
  Filled 2017-10-13: qty 2

## 2017-10-13 MED ORDER — SUCRALFATE 1 GM/10ML PO SUSP
1.0000 g | Freq: Three times a day (TID) | ORAL | Status: DC
  Administered 2017-10-13 – 2017-10-24 (×34): 1 g via ORAL
  Filled 2017-10-13 (×40): qty 10

## 2017-10-13 MED ORDER — POTASSIUM CHLORIDE IN NACL 40-0.9 MEQ/L-% IV SOLN
INTRAVENOUS | Status: AC
  Administered 2017-10-13 – 2017-10-14 (×2): 75 mL/h via INTRAVENOUS
  Filled 2017-10-13 (×2): qty 1000

## 2017-10-13 MED ORDER — HYDROCODONE-ACETAMINOPHEN 5-325 MG PO TABS
1.0000 | ORAL_TABLET | Freq: Four times a day (QID) | ORAL | Status: DC | PRN
  Administered 2017-10-14 – 2017-10-21 (×15): 2 via ORAL
  Filled 2017-10-13 (×12): qty 2
  Filled 2017-10-13: qty 1
  Filled 2017-10-13 (×5): qty 2

## 2017-10-13 MED ORDER — PANTOPRAZOLE SODIUM 40 MG IV SOLR
40.0000 mg | Freq: Two times a day (BID) | INTRAVENOUS | Status: DC
  Administered 2017-10-13 – 2017-10-17 (×8): 40 mg via INTRAVENOUS
  Filled 2017-10-13 (×8): qty 40

## 2017-10-13 MED ORDER — ALBUTEROL SULFATE HFA 108 (90 BASE) MCG/ACT IN AERS: 2.0000 | INHALATION_SPRAY | RESPIRATORY_TRACT | Status: DC | PRN

## 2017-10-13 MED ORDER — ONDANSETRON 4 MG PO TBDP
8.0000 mg | ORAL_TABLET | Freq: Three times a day (TID) | ORAL | Status: DC | PRN
  Filled 2017-10-13: qty 2

## 2017-10-13 MED ORDER — FLUCONAZOLE 100 MG PO TABS
200.0000 mg | ORAL_TABLET | ORAL | Status: DC
  Administered 2017-10-13 – 2017-10-20 (×2): 200 mg via ORAL
  Filled 2017-10-13 (×2): qty 2

## 2017-10-13 MED ORDER — ACETAMINOPHEN 650 MG RE SUPP
650.0000 mg | Freq: Four times a day (QID) | RECTAL | Status: DC | PRN
  Administered 2017-10-20 – 2017-10-21 (×4): 650 mg via RECTAL
  Filled 2017-10-13 (×4): qty 1

## 2017-10-13 MED ORDER — ALBUTEROL SULFATE (2.5 MG/3ML) 0.083% IN NEBU: 2.5000 mg | INHALATION_SOLUTION | RESPIRATORY_TRACT | Status: DC | PRN

## 2017-10-13 NOTE — Consult Note (Signed)
Reason for Consult:.  Biliary dyskinesia Referring Physician: Dr. Rob Bunting [name] is an 41 y.o. female.    HPI: Patient services see 07/30/17 at [facility] for right upper quadrant abdominal pain.  She underwent a HIDA scan which showed reduced ejection fraction of 9%,with likely biliary dyskinesis.  There is no gallbladder wall thickening or suggestion of acute cholecystitis on abdominal ultrasound in August.  She has HIV with leukopenia, medication noncompliance hx, GERD,  Hx of esophageal stenosis, history of esophageal candidiasis.  She also has a history of esophageal strictures and stenosis history of Candida esophagitis. She was diagnosised with [facility] - HIV patient during her August admission along with pancytopenia.  She was not a candidate for surgery at that point.  She was managed medically at that time with attempts to get her back on HIV medicines.  She followed up in the office, 08/27/17, plans for elective cholecystectomy once her HIV was control and Dr. Graylon Good felt her immunologic response, WBC count was stable.  She returns to the ED yesterday with 9/10 chest pain upper back pain for a few days.  Family reported she was not eating well at home.  Workup in the ED shows she is afebrile, hypertensive but not tachycardic.  Labs show a potassium of 2.6, chloride of 99, alk phos 221, albumin 3.2.  All other values in the CMP are normal.  WBC is 2.1 hemoglobin 6.8, hematocrit 22.7, platelets 206,000.  CT scan without contrast today shows liver appears normal.  Sludge versus stones layering in the gallbladder question of pericholecystic fluid spleen and pancreas are unremarkable.  Stomach and duodenum are normal there is no disproportionate dilatation small bowel, normal appendix in the right lower quadrant there is no obvious mass colon.  It was their opinion this was sludge versus stones in the gallbladder, pericholecystic fluid was suspected.  We are asked to  see.      Past Medical History:  Diagnosis Date  . Acute psychosis ([place]) 03/10/12   2nd admission in last wk for this  . Angio-edema   . Anxiety   . Asthma    inhaler 2xday  . Bipolar disorder ([place])   . Depression   . GERD (gastroesophageal reflux disease)   . Hepatitis B    /E-chart  . History of blood transfusion   . HIV positive ([place])   . Microcytic anemia    h/o per E-chart  . Noncompliance with medication regimen    /e-chart  . 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 Pyelonephritis    h/o per E-chart  . Shortness of breath dyspnea    due to Asthma  . Thyroid disease   . Urticaria     Past Surgical History:  Procedure Laterality Date  . BALLOON DILATION N/A 02/14/2015   Procedure: BALLOON DILATION;  Surgeon: Lear Ng, MD;  Location: [facility] ENDOSCOPY;  Service: Endoscopy;  Laterality: N/A;  . BALLOON DILATION N/A 10/25/2016   Procedure: BALLOON DILATION;  Surgeon: Wilford Corner, MD;  Location: [facility] ENDOSCOPY;  Service: Endoscopy;  Laterality: N/A;  . BALLOON DILATION N/A 01/14/2017   Procedure: BALLOON DILATION;  Surgeon: Wilford Corner, MD;  Location: [facility] ENDOSCOPY;  Service: Endoscopy;  Laterality: N/A;  . BALLOON DILATION N/A 04/30/2017   Procedure: BALLOON DILATION;  Surgeon: Wilford Corner, MD;  Location: [facility] ENDOSCOPY;  Service: Endoscopy;  Laterality: N/A;  . ESOPHAGOGASTRODUODENOSCOPY N/A 07/13/2013   Procedure: ESOPHAGOGASTRODUODENOSCOPY (EGD);  Surgeon: Beryle Beams,  MD;  Location: [facility] ENDOSCOPY;  Service: Endoscopy;  Laterality: N/A;  . ESOPHAGOGASTRODUODENOSCOPY N/A 08/12/2014   Procedure: ESOPHAGOGASTRODUODENOSCOPY (EGD);  Surgeon: Milus Banister, MD;  Location: [facility] ENDOSCOPY;  Service: Endoscopy;  Laterality: N/A;  . ESOPHAGOGASTRODUODENOSCOPY N/A 02/14/2015   Procedure: ESOPHAGOGASTRODUODENOSCOPY (EGD);  Surgeon: Lear Ng, MD;  Location: [facility] ENDOSCOPY;  Service: Endoscopy;  Laterality:  N/A;  . ESOPHAGOGASTRODUODENOSCOPY N/A 10/25/2016   Procedure: ESOPHAGOGASTRODUODENOSCOPY (EGD);  Surgeon: Wilford Corner, MD;  Location: [facility] ENDOSCOPY;  Service: Endoscopy;  Laterality: N/A;  . ESOPHAGOGASTRODUODENOSCOPY (EGD) WITH PROPOFOL N/A 04/11/2015   Procedure: ESOPHAGOGASTRODUODENOSCOPY (EGD) WITH PROPOFOL;  Surgeon: Wilford Corner, MD;  Location: [facility] ENDOSCOPY;  Service: Endoscopy;  Laterality: N/A;  . ESOPHAGOGASTRODUODENOSCOPY (EGD) WITH PROPOFOL N/A 09/11/2015   Procedure: ESOPHAGOGASTRODUODENOSCOPY (EGD) WITH PROPOFOL;  Surgeon: Wilford Corner, MD;  Location: [facility] ENDOSCOPY;  Service: Endoscopy;  Laterality: N/A;  . ESOPHAGOGASTRODUODENOSCOPY (EGD) WITH PROPOFOL N/A 01/14/2017   Procedure: ESOPHAGOGASTRODUODENOSCOPY (EGD) WITH PROPOFOL;  Surgeon: Wilford Corner, MD;  Location: [facility] ENDOSCOPY;  Service: Endoscopy;  Laterality: N/A;  . ESOPHAGOGASTRODUODENOSCOPY (EGD) WITH PROPOFOL N/A 04/30/2017   Procedure: ESOPHAGOGASTRODUODENOSCOPY (EGD) WITH PROPOFOL;  Surgeon: Wilford Corner, MD;  Location: [place];  Service: Endoscopy;  Laterality: N/A;  . SAVORY DILATION N/A 02/14/2015   Procedure: SAVORY DILATION;  Surgeon: Lear Ng, MD;  Location: [place];  Service: Endoscopy;  Laterality: N/A;  no xray needed  . SAVORY DILATION N/A 04/11/2015   Procedure: SAVORY DILATION;  Surgeon: Wilford Corner, MD;  Location: [facility] ENDOSCOPY;  Service: Endoscopy;  Laterality: N/A;  . SAVORY DILATION N/A 09/11/2015   Procedure: SAVORY DILATION;  Surgeon: Wilford Corner, MD;  Location: [facility] ENDOSCOPY;  Service: Endoscopy;  Laterality: N/A;  . SAVORY DILATION N/A 04/30/2017   Procedure: SAVORY DILATION;  Surgeon: Wilford Corner, MD;  Location: [place];  Service: Endoscopy;  Laterality: N/A;  . THYROID LOBECTOMY  08/2002   left & isthmectomy; for benign thyroid adenoma/E-chart  . THYROID SURGERY    . TUBAL LIGATION  07/2002   /E-chart    Family History  Problem Relation Age of  Onset  . Cancer Mother   . Diabetes Mother   . Diabetes Father   . Heart disease Father   . Diabetes Sister   . Urticaria Sister   . Asthma Son   . Allergic rhinitis Neg Hx   . Eczema Neg Hx   . Immunodeficiency Neg Hx     Social History:  reports that she has never smoked. She has never used smokeless tobacco. She reports that she does not drink alcohol or use drugs.  Allergies:  Allergies  Allergen Reactions  . Dapsone Itching and Rash  . Other Hives and Itching    Berries, TREE NUTS  . Peanut-Containing Drug Products Hives  . Shellfish Allergy Hives  . Bactrim Itching  . Orange Fruit Itching  . Sulfa Antibiotics Itching    Prior to Admission medications   Medication Sig Start Date End Date Taking? Authorizing Provider  albuterol (PROAIR HFA) 108 (90 BASE) MCG/ACT inhaler Inhale 2 puffs into the lungs every 4 (four) hours as needed for wheezing or shortness of breath (cough). 11/16/15  Yes Gean Quint, MD  benztropine (COGENTIN) 1 MG tablet Take 1 tablet (1 mg total) by mouth daily. 11/12/16  Yes Michel Bickers, MD  dapsone 100 MG tablet Take 100 mg by mouth daily.   Yes [provider]  fluconazole (DIFLUCAN) 100 MG tablet Take 2 tablets (200 mg total) by mouth once  a week. 08/05/17  Yes Charlynne Cousins, MD  OLANZapine (ZYPREXA) 5 MG tablet Take 1 tablet (5 mg total) by mouth at bedtime. 11/12/16  Yes Michel Bickers, MD  ondansetron (ZOFRAN ODT) 8 MG disintegrating tablet Take 1 tablet (8 mg total) by mouth every 8 (eight) hours as needed for nausea or vomiting. 09/03/17  Yes Carlyle Basques, MD  pantoprazole (PROTONIX) 40 MG tablet Take 1 tablet (40 mg total) by mouth daily. 11/12/16  Yes Michel Bickers, MD  sucralfate ([name]) 1 g tablet Take 1 tablet (1 g total) by mouth 4 (four) times daily -  with meals and at bedtime. 04/03/17 10/13/17 Yes Cardama, Grayce Sessions, MD  traMADol (ULTRAM) 50 MG tablet Take 1 tablet (50 mg total) by mouth every 6 (six) hours  as needed for moderate pain. 08/05/17  Yes Charlynne Cousins, MD     Results for orders placed or performed during the hospital encounter of 10/13/17 (from the past 48 hour(s))  Basic metabolic panel     Status: Abnormal   Collection Time: 10/12/17 11:41 PM  Result Value Ref Range   Sodium 135 135 - 145 mmol/L   Potassium 2.6 (LL) 3.5 - 5.1 mmol/L    Comment: CRITICAL RESULT CALLED TO, READ BACK BY AND VERIFIED WITH: OBAMA P,RN 10/13/17 0030 WAYK    Chloride 99 (L) 101 - 111 mmol/L   CO2 23 22 - 32 mmol/L   Glucose, Bld 96 65 - 99 mg/dL   BUN 15 6 - 20 mg/dL   Creatinine, Ser 0.93 0.44 - 1.00 mg/dL   Calcium 9.3 8.9 - 10.3 mg/dL   GFR calc non Af Amer >60 >60 mL/min   GFR calc Af Amer >60 >60 mL/min    Comment: (NOTE) The eGFR has been calculated using the CKD EPI equation. This calculation has not been validated in all clinical situations. eGFR's persistently <60 mL/min signify possible Chronic Kidney Disease.    Anion gap 13 5 - 15  CBC     Status: Abnormal   Collection Time: 10/12/17 11:41 PM  Result Value Ref Range   WBC 2.1 (L) 4.0 - 10.5 K/uL   RBC 3.27 (L) 3.87 - 5.11 MIL/uL   Hemoglobin 6.8 (LL) 12.0 - 15.0 g/dL    Comment: REPEATED TO VERIFY CRITICAL RESULT CALLED TO, READ BACK BY AND VERIFIED WITH: B.SANGALANG,RN 0011 10/13/17 M.CAMPBELL    HCT 22.7 (L) 36.0 - 46.0 %   MCV 69.4 (L) 78.0 - 100.0 fL   MCH 20.8 (L) 26.0 - 34.0 pg   MCHC 30.0 30.0 - 36.0 g/dL   RDW 19.0 (H) 11.5 - 15.5 %   Platelets 256 150 - 400 K/uL  Hepatic function panel     Status: Abnormal   Collection Time: 10/12/17 11:41 PM  Result Value Ref Range   Total Protein 7.4 6.5 - 8.1 g/dL   Albumin 3.2 (L) 3.5 - 5.0 g/dL   AST 34 15 - 41 U/L   ALT 14 14 - 54 U/L   Alkaline Phosphatase 221 (H) 38 - 126 U/L   Total Bilirubin 1.2 0.3 - 1.2 mg/dL   Bilirubin, Direct 0.3 0.1 - 0.5 mg/dL   Indirect Bilirubin 0.9 0.3 - 0.9 mg/dL  Lipase, blood     Status: None   Collection Time: 10/12/17  11:41 PM  Result Value Ref Range   Lipase 45 11 - 51 U/L  Madison-stat troponin, ED     Status: None   Collection Time: 10/12/17 11:54  PM  Result Value Ref Range   Troponin [place], poc 0.01 0.00 - 0.08 ng/mL   Comment 3            Comment: Due to the release kinetics of cTnI, a negative result within the first hours of the onset of symptoms does not rule out myocardial infarction with certainty. If myocardial infarction is still suspected, repeat the test at appropriate intervals.   Type and screen [place]     Status: None (Preliminary result)   Collection Time: 10/13/17  1:55 AM  Result Value Ref Range   ABO/RH(D) A POS    Antibody Screen NEG    Sample Expiration 10/16/2017    Unit Number [id number]    Blood Component Type RED CELLS,LR    Unit division 00    Status of Unit ISSUED    Transfusion Status OK TO TRANSFUSE    Crossmatch Result COMPATIBLE    Unit Number [id number]    Blood Component Type RED CELLS,LR    Unit division 00    Status of Unit ISSUED    Transfusion Status OK TO TRANSFUSE    Crossmatch Result COMPATIBLE   Prepare RBC     Status: None   Collection Time: 10/13/17  1:55 AM  Result Value Ref Range   Order Confirmation ORDER PROCESSED BY BLOOD BANK   Magnesium     Status: None   Collection Time: 10/13/17  1:59 AM  Result Value Ref Range   Magnesium 1.9 1.7 - 2.4 mg/dL  Troponin [place] (q 6hr x 3)     Status: None   Collection Time: 10/13/17  7:28 AM  Result Value Ref Range   Troponin [place] <0.03 <0.03 ng/mL    Ct Abdomen Pelvis Wo Contrast  Result Date: 10/13/2017 CLINICAL DATA:  Diffuse abdominal pain. Nausea and vomiting. Symptoms for 18 days. Patient refused oral contrast. EXAM: CT ABDOMEN AND PELVIS WITHOUT CONTRAST TECHNIQUE: Multidetector CT imaging of the abdomen and pelvis was performed following the standard protocol without IV contrast. COMPARISON:  10/16/2016 FINDINGS: The study is severely limited by lack of both intravenous and  oral contrast. Lower chest: No acute abnormality.  The left ventricle is dilated. Hepatobiliary: Liver is within normal limits. Sludge versus stones layering in the gallbladder. Pericholecystic fluid is suggested. Pancreas: Unremarkable Spleen: Unremarkable Adrenals/Urinary Tract: Adrenal glands and kidneys are unremarkable. Bladder is unremarkable. Stomach/Bowel: Stomach and duodenum are grossly within normal limits. There is no disproportionate dilatation of small bowel. Normal appendix in the right lower quadrant. There is no obvious mass in the colon. Vascular/Lymphatic: No evidence of aortic aneurysm. Several small oval densities are seen to the left of the abdominal aorta. Lymph nodes of indeterminate size cannot be excluded. Reproductive: The uterus is grossly within normal limits. Other: There is no free fluid. Musculoskeletal: L5-S1 degenerative disc disease with vacuum is noted. No obvious spinal stenosis. IMPRESSION: Sludge versus stones in the gallbladder. Pericholecystic fluid is suspected. Ultrasound can be performed to further delineate. Chronic changes. Exam is limited by lack of both intravenous and oral contrast. Electronically Signed   By: Marybelle Killings M.D.   On: 10/13/2017 09:26   Dg Chest 2 View  Result Date: 10/13/2017 CLINICAL DATA:  Chest pain. EXAM: CHEST  2 VIEW COMPARISON:  Radiograph 07/30/2017 FINDINGS: The cardiomediastinal contours are normal. The lungs are clear. Pulmonary vasculature is normal. No consolidation, pleural effusion, or pneumothorax. No acute osseous abnormalities are seen. IMPRESSION: No active cardiopulmonary disease. Electronically Signed  By: Jeb Levering M.D.   On: 10/13/2017 00:11    Review of Systems  Constitutional: Positive for weight loss (20 lbs over the last month).  HENT: Negative.   Eyes: Negative.   Respiratory: Negative.   Cardiovascular: Negative.   Gastrointestinal: Positive for abdominal pain, nausea and vomiting. Negative for blood  in stool, constipation, diarrhea and melena.  Genitourinary: Negative.   Musculoskeletal: Negative.   Skin: Negative.   Neurological: Negative.   Endo/Heme/Allergies: Negative.   Psychiatric/Behavioral: Negative.    Blood pressure (!) 162/101, pulse 74, temperature 97.9 F (36.6 C), temperature source Oral, resp. rate 14, height 6' (1.829 m), weight 57.6 kg (127 lb), last menstrual period 09/29/2017, SpO2 100 %. Physical Exam  Constitutional: She is oriented to person, place, and time. She appears well-developed and well-nourished. No distress.  HENT:  Head: Normocephalic and atraumatic.  Cardiovascular: Normal rate, regular rhythm, normal heart sounds and intact distal pulses.   No murmur heard. Respiratory: Effort normal and breath sounds normal. No respiratory distress. She has no wheezes. She has no rales. She exhibits no tenderness.  GI: Soft. Bowel sounds are normal. She exhibits no distension and no mass. There is tenderness (RUQ). There is no rebound and no guarding.  Musculoskeletal: She exhibits no edema or tenderness.  Neurological: She is alert and oriented to person, place, and time. No cranial nerve deficit.  Skin: Skin is warm and dry. No rash noted. She is not diaphoretic. No erythema. No pallor.  Psychiatric: She has a normal mood and affect. Her behavior is normal. Judgment and thought content normal.    Assessment/Plan: Abdominal pain and weight loss Biliary dyskinesia -EF 9% Hx of esophageal stricture and candidiasis  Anemia - Transfused H/H 6.8/22.8 Leukopenia 2.1K Thrombocytopenia - resolved HIV with hx of noncompliance/last CD 4 T cell 20/CD4% helper T cell 7 Hepatitis B  Plan:  Discussed with Dr. Eliseo Squires. She has been followed by Dr. Excell Seltzer and it was his opinion that she may benefit from surgery if her other medical issues are stable.   We will let GI and ID evaluate and discuss surgery if she is ready for it.   Office visit with Dr. Excell Seltzer  08/27/17.   [name] 10/13/2017, 12:38 PM

## 2017-10-13 NOTE — H&P (Signed)
TRH H&P   Patient Demographics:    [name], is a 41 y.o. female  MRN: [id number]   DOB - 1976/04/19  Admit Date - 10/13/2017  Outpatient Primary MD for the patient is Nolene Ebbs, MD  Referring MD/NP/PA: Aris Georgia  Outpatient Specialists:  Michel Bickers / Dr Graylon Good Wilford Corner (GI)  Patient coming from: home  Chief Complaint  Patient presents with  . Chest Pain      HPI:    [name]  is a 41 y.o. female, w HIV, Hepatitis B, Gerd, Esophageal stenosis , Esophageal candidiasis apparently presents w chest pain.  "sharp pain"  Started 3 weeks ago.  Fairly constant.  + nausea and some vomitting  Starting 3 weeks ago.  Some diarrhea starting yesterday.  "loose stool"  Had 2 loose stool yesterday.  Slight epigastric discomfort as well over the past 3 week,  Denies fever, chills, constipation, brbpr, black stool.   Pt has not been taking her pantoprazole for the past 1 week.   Pt presented to ED for this sharp chest pain.   Ekg ST at 123, nl axis, no st-t changes c/w ischemia Trop negative 0.01 CXR  IMPRESSION: No active cardiopulmonary disease.  Na 135, K2.6   Bun 15, Creatinine 0.93 Wbc 2.1, Hgb 6.8, P 256 Ast 34, Alt 14, Alk phos 221, T. Bili 1.2 Lipase 45  ED requested admission for evaluation of chest pain, as well as epigastric pain and n/v, diarrhea.        Review of systems:    In addition to the HPI above,  No Fever-chills, No Headache, No changes with Vision or hearing, No problems swallowing food or Liquids, No Cough or Shortness of Breath, No Blood in stool or Urine, No dysuria, No new skin rashes or bruises, No new joints pains-aches,  No new weakness, tingling, numbness in any extremity, No recent weight gain or loss, No polyuria, polydypsia or polyphagia, No significant Mental Stressors.  A full 10 point Review of Systems was  done, except as stated above, all other Review of Systems were negative.   With Past History of the following :    Past Medical History:  Diagnosis Date  . Acute psychosis ([place]) 03/10/12   2nd admission in last wk for this  . Angio-edema   . Anxiety   . Asthma    inhaler 2xday  . Bipolar disorder ([place])   . Depression   . GERD (gastroesophageal reflux disease)   . Hepatitis B    /E-chart  . History of blood transfusion   . HIV positive ([place])   . Microcytic anemia    h/o per E-chart  . Noncompliance with medication regimen    /e-chart  . 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 Pyelonephritis    h/o per E-chart  . Shortness of breath dyspnea  due to Asthma  . Thyroid disease   . Urticaria       Past Surgical History:  Procedure Laterality Date  . BALLOON DILATION N/A 02/14/2015   Procedure: BALLOON DILATION;  Surgeon: Lear Ng, MD;  Location: [facility] ENDOSCOPY;  Service: Endoscopy;  Laterality: N/A;  . BALLOON DILATION N/A 10/25/2016   Procedure: BALLOON DILATION;  Surgeon: Wilford Corner, MD;  Location: [facility] ENDOSCOPY;  Service: Endoscopy;  Laterality: N/A;  . BALLOON DILATION N/A 01/14/2017   Procedure: BALLOON DILATION;  Surgeon: Wilford Corner, MD;  Location: [facility] ENDOSCOPY;  Service: Endoscopy;  Laterality: N/A;  . BALLOON DILATION N/A 04/30/2017   Procedure: BALLOON DILATION;  Surgeon: Wilford Corner, MD;  Location: [facility] ENDOSCOPY;  Service: Endoscopy;  Laterality: N/A;  . ESOPHAGOGASTRODUODENOSCOPY N/A 07/13/2013   Procedure: ESOPHAGOGASTRODUODENOSCOPY (EGD);  Surgeon: Beryle Beams, MD;  Location: [facility] ENDOS[facility];  Service: Endoscopy;  Laterality: N/A;  . ESOPHAGOGASTRODUODENOSCOPY N/A 08/12/2014   Procedure: ESOPHAGOGASTRODUODENOSCOPY (EGD);  Surgeon: Milus Banister, MD;  Location: [facility] ENDOSCOPY;  Service: Endoscopy;  Laterality: N/A;  . ESOPHAGOGASTRODUODENOSCOPY N/A 02/14/2015   Procedure:  ESOPHAGOGASTRODUODENOSCOPY (EGD);  Surgeon: Lear Ng, MD;  Location: [facility] ENDOSCOPY;  Service: Endoscopy;  Laterality: N/A;  . ESOPHAGOGASTRODUODENOSCOPY N/A 10/25/2016   Procedure: ESOPHAGOGASTRODUODENOSCOPY (EGD);  Surgeon: Wilford Corner, MD;  Location: [facility] ENDOSCOPY;  Service: Endoscopy;  Laterality: N/A;  . ESOPHAGOGASTRODUODENOSCOPY (EGD) WITH PROPOFOL N/A 04/11/2015   Procedure: ESOPHAGOGASTRODUODENOSCOPY (EGD) WITH PROPOFOL;  Surgeon: Wilford Corner, MD;  Location: [facility] ENDOSCOPY;  Service: Endoscopy;  Laterality: N/A;  . ESOPHAGOGASTRODUODENOSCOPY (EGD) WITH PROPOFOL N/A 09/11/2015   Procedure: ESOPHAGOGASTRODUODENOSCOPY (EGD) WITH PROPOFOL;  Surgeon: Wilford Corner, MD;  Location: [facility] ENDOSCOPY;  Service: Endoscopy;  Laterality: N/A;  . ESOPHAGOGASTRODUODENOSCOPY (EGD) WITH PROPOFOL N/A 01/14/2017   Procedure: ESOPHAGOGASTRODUODENOSCOPY (EGD) WITH PROPOFOL;  Surgeon: Wilford Corner, MD;  Location: [facility] ENDOSCOPY;  Service: Endoscopy;  Laterality: N/A;  . ESOPHAGOGASTRODUODENOSCOPY (EGD) WITH PROPOFOL N/A 04/30/2017   Procedure: ESOPHAGOGASTRODUODENOSCOPY (EGD) WITH PROPOFOL;  Surgeon: Wilford Corner, MD;  Location: [place];  Service: Endoscopy;  Laterality: N/A;  . SAVORY DILATION N/A 02/14/2015   Procedure: SAVORY DILATION;  Surgeon: Lear Ng, MD;  Location: [place];  Service: Endoscopy;  Laterality: N/A;  no xray needed  . SAVORY DILATION N/A 04/11/2015   Procedure: SAVORY DILATION;  Surgeon: Wilford Corner, MD;  Location: [facility] ENDOSCOPY;  Service: Endoscopy;  Laterality: N/A;  . SAVORY DILATION N/A 09/11/2015   Procedure: SAVORY DILATION;  Surgeon: Wilford Corner, MD;  Location: [facility] ENDOSCOPY;  Service: Endoscopy;  Laterality: N/A;  . SAVORY DILATION N/A 04/30/2017   Procedure: SAVORY DILATION;  Surgeon: Wilford Corner, MD;  Location: [place];  Service: Endoscopy;  Laterality: N/A;  . THYROID LOBECTOMY  08/2002   left & isthmectomy; for benign  thyroid adenoma/E-chart  . THYROID SURGERY    . TUBAL LIGATION  07/2002   /E-chart      Social History:     Social History  Substance Use Topics  . Smoking status: Never Smoker  . Smokeless tobacco: Never Used  . Alcohol use No     Lives -  At home Mobility -  Walks by self  Family History :     Family History  Problem Relation Age of Onset  . Cancer Mother   . Diabetes Mother   . Diabetes Father   . Heart disease Father   . Diabetes Sister   . Urticaria Sister   . Asthma Son   . Allergic rhinitis Neg Hx   .  Eczema Neg Hx   . Immunodeficiency Neg Hx       Home Medications:   Prior to Admission medications   Medication Sig Start Date End Date Taking? Authorizing Provider  albuterol (PROAIR HFA) 108 (90 BASE) MCG/ACT inhaler Inhale 2 puffs into the lungs every 4 (four) hours as needed for wheezing or shortness of breath (cough). 11/16/15   Gean Quint, MD  atovaquone [facility]) 750 MG/5ML suspension Take 10 mLs (1,500 mg total) by mouth daily with breakfast. 09/03/17   Carlyle Basques, MD  benztropine (COGENTIN) 1 MG tablet Take 1 tablet (1 mg total) by mouth daily. 11/12/16   Michel Bickers, MD  elvitegravir-cobicistat-emtricitabine-tenofovir (GENVOYA) 150-150-200-10 MG TABS tablet Take 1 tablet by mouth daily with breakfast. 11/12/16   Michel Bickers, MD  fluconazole (DIFLUCAN) 100 MG tablet Take 2 tablets (200 mg total) by mouth once a week. 08/05/17   Charlynne Cousins, MD  OLANZapine (ZYPREXA) 5 MG tablet Take 1 tablet (5 mg total) by mouth at bedtime. 11/12/16   Michel Bickers, MD  ondansetron (ZOFRAN ODT) 8 MG disintegrating tablet Take 1 tablet (8 mg total) by mouth every 8 (eight) hours as needed for nausea or vomiting. 09/03/17   Carlyle Basques, MD  pantoprazole (PROTONIX) 40 MG tablet Take 1 tablet (40 mg total) by mouth daily. 11/12/16   Michel Bickers, MD  sucralfate ([name]) 1 g tablet Take 1 tablet (1 g total) by mouth 4 (four) times daily -  with  meals and at bedtime. 04/03/17 07/29/17  Fatima Blank, MD  traMADol (ULTRAM) 50 MG tablet Take 1 tablet (50 mg total) by mouth every 6 (six) hours as needed for moderate pain. 08/05/17   Charlynne Cousins, MD     Allergies:     Allergies  Allergen Reactions  . Dapsone Itching and Rash  . Other Hives and Itching    Berries, TREE NUTS  . Peanut-Containing Drug Products Hives  . Shellfish Allergy Hives  . Bactrim Itching  . Orange Fruit Itching  . Sulfa Antibiotics Itching     Physical Exam:   Vitals  Blood pressure 121/80, pulse 75, temperature 98.2 F (36.8 C), temperature source Oral, resp. rate 16, height 6' (1.829 m), weight 57.6 kg (127 lb), last menstrual period 09/29/2017, SpO2 100 %.   1. General  lying in bed in NAD,    2. Normal affect and insight, Not Suicidal or Homicidal, Awake Alert, Oriented X 3.  3. No F.N deficits, ALL C.Nerves Intact, Strength 5/5 all 4 extremities, Sensation intact all 4 extremities, Plantars down going.  4. Ears and Eyes appear Normal, Conjunctivae clear, PERRLA. Moist Oral Mucosa.  5. Supple Neck, No JVD, No cervical lymphadenopathy appriciated, No Carotid Bruits.  6. Symmetrical Chest wall movement, Good air movement bilaterally, CTAB.  7. RRR, No Gallops, Rubs or Murmurs, No Parasternal Heave.  8. Positive Bowel Sounds, Abdomen Soft, No tenderness, No organomegaly appriciated,No rebound -guarding or rigidity.  9.  No Cyanosis, Normal Skin Turgor, No Skin Rash or Bruise.  10. Good muscle tone,  joints appear normal , no effusions, Normal ROM.  11. No Palpable Lymph Nodes in Neck or Axillae     Data Review:    CBC  Recent Labs Lab 10/12/17 2341  WBC 2.1*  HGB 6.8*  HCT 22.7*  PLT 256  MCV 69.4*  MCH 20.8*  MCHC 30.0  RDW 19.0*   ------------------------------------------------------------------------------------------------------------------  Chemistries   Recent Labs Lab 10/12/17 2341  10/13/17 0159  NA  135  --   K 2.6*  --   CL 99*  --   CO2 23  --   GLUCOSE 96  --   BUN 15  --   CREATININE 0.93  --   CALCIUM 9.3  --   MG  --  1.9  AST 34  --   ALT 14  --   ALKPHOS 221*  --   BILITOT 1.2  --    ------------------------------------------------------------------------------------------------------------------ estimated creatinine clearance is 73.1 mL/min (by C-G formula based on SCr of 0.93 mg/dL). ------------------------------------------------------------------------------------------------------------------ No results for input(s): TSH, T4TOTAL, T3FREE, THYROIDAB in the last 72 hours.  Invalid input(s): FREET3  Coagulation profile No results for input(s): INR, PROTIME in the last 168 hours. ------------------------------------------------------------------------------------------------------------------- No results for input(s): DDIMER in the last 72 hours. -------------------------------------------------------------------------------------------------------------------  Cardiac Enzymes No results for input(s): CKMB, TROPONINI, MYOGLOBIN in the last 168 hours.  Invalid input(s): CK ------------------------------------------------------------------------------------------------------------------ No results found for: BNP   ---------------------------------------------------------------------------------------------------------------  Urinalysis    Component Value Date/Time   COLORURINE YELLOW 07/29/2017 1357   APPEARANCEUR HAZY (A) 07/29/2017 1357   LABSPEC 1.009 07/29/2017 1357   PHURINE 6.0 07/29/2017 1357   GLUCOSEU NEGATIVE 07/29/2017 1357   HGBUR LARGE (A) 07/29/2017 1357   BILIRUBINUR NEGATIVE 07/29/2017 1357   KETONESUR 5 (A) 07/29/2017 1357   PROTEINUR 100 (A) 07/29/2017 1357   UROBILINOGEN 0.2 12/07/2014 1441   NITRITE NEGATIVE 07/29/2017 1357   LEUKOCYTESUR LARGE (A) 07/29/2017 1357     ----------------------------------------------------------------------------------------------------------------   Imaging Results:    Dg Chest 2 View  Result Date: 10/13/2017 CLINICAL DATA:  Chest pain. EXAM: CHEST  2 VIEW COMPARISON:  Radiograph 07/30/2017 FINDINGS: The cardiomediastinal contours are normal. The lungs are clear. Pulmonary vasculature is normal. No consolidation, pleural effusion, or pneumothorax. No acute osseous abnormalities are seen. IMPRESSION: No active cardiopulmonary disease. Electronically Signed   By: Jeb Levering M.D.   On: 10/13/2017 00:11      Assessment & Plan:    Active Problems:   Anemia    N/v  zofran CT scan abd/ pelvis NPO Start protonix 43m iv bid Hydrate with ns iv Pleased consult GI this am for evaluation  Abdominal pain CT scan as above  Cp Tele Trop I q6h x3 Suspect GI etiology  Anemia Transfusion ordered by ED Repeat cbc after transfusion completed  Abnormal lft (elevation in alk phos) Check GGT  Hiv/  Hepatitis B Please consult ID  this am  Gerd Protonix iv as above Continue sucralfate  H/o esophageal candidiasis Not currently taking fluconazole    DVT Prophylaxis  SCDs   AM Labs Ordered, also please review Full Orders  Family Communication: Admission, patients condition and plan of care including tests being ordered have been discussed with the patient  who indicate understanding and agree with the plan and Code Status.  Code Status FULL CODE  Likely DC to  home  Condition GUARDED    Consults called: none  Admission status: observation   Time spent in minutes : 45   JJani GravelM.D on 10/13/2017 at 4:44 AM  Between 7am to 7pm - Pager - [phone] . After 7pm go to [email or web address] - password [email or web address] - Office  [phone]

## 2017-10-13 NOTE — ED Notes (Signed)
Dr. Winfred Leeds notified on pt.'s low Hgb , will transfer pt. to next available room .

## 2017-10-13 NOTE — ED Notes (Signed)
Attempted IV x2 w/ no success. IVT consult in place. 

## 2017-10-13 NOTE — ED Notes (Signed)
No blood draw at this time,   Pt receiving blood products. 

## 2017-10-13 NOTE — Progress Notes (Signed)
Patient admitted after midnight, please see H&P.  Have consulted GI for possible EGD.  Have ordered repeat labs-- ? Accuracy-- much different from prior-- plts much improved.  General surgery consult for possible GB removal if appropriate.  Will allow clears for now.  Spoke with ID Dr. Baxter Flattery.    Madison Bear DO

## 2017-10-13 NOTE — ED Provider Notes (Signed)
[facility] DE[facility] Provider Note   CSN: [id number] Arrival date & time: 10/12/17  2314  Time seen 01:07 AM   History   Chief Complaint Chief Complaint  Patient presents with  . Chest Pain    HPI [name] is a 41 y.o. female.  HPI patient states she started having chest pain on October 10.  She states is both anterior and posterior but it is worse anteriorly.  She states it is her whole chest.  She states changing positions makes the pain worse, heat and leftover tramadol has helped.  She also states it hurts to swallow.  She was diagnosed with yeast esophagitis and she quit taking all of her medicines a month ago.  She describes the pain as sharp.  She states she feels short of breath all the time.  She denies cough, fever, or chills.  She states this pain is like pain she has had before from the yeast esophagitis and her gallbladder.  She states she is waiting to hear from a surgeon about getting her gallbladder removed.  She states she has not been able to eat or drink for a week.  She states if she eats or drinks she has vomiting about twice a day.  She denies nausea.  She also has had diarrhea with twice a day that this is described as loose but that is improved today.  She states she has diffuse abdominal pain that she describes as a "hunger pain".  She states she feels dizzy, lightheaded and weak.  She denies having any recent heavy periods.  She denies any rectal bleeding.  Patient has a history of anemia and she last had a blood transfusion on August 21 and got 2 units of blood.  Review of patient's chart shows she is noncompliant HIV patient with hepatitis B and bipolar disease.  She states she has not taken any of her medications for a month.  PCP Nolene Ebbs, MD  ID Dr Megan Salon and Baxter Flattery   Past Medical History:  Diagnosis Date  . Acute psychosis ([place]) 03/10/12   2nd admission in last wk for this  . Angio-edema   . Anxiety   . Asthma     inhaler 2xday  . Bipolar disorder ([place])   . Depression   . GERD (gastroesophageal reflux disease)   . Hepatitis B    /E-chart  . History of blood transfusion   . HIV positive ([place])   . Microcytic anemia    h/o per E-chart  . Noncompliance with medication regimen    /e-chart  . 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 Pyelonephritis    h/o per E-chart  . Shortness of breath dyspnea    due to Asthma  . Thyroid disease   . Urticaria     Patient Active Problem List   Diagnosis Date Noted  . Anemia 10/13/2017  . Thrombocytopenia ([place]) 07/31/2017  . Neutropenia with fever ([place]) 07/30/2017  . Acalculous cholecystitis 07/30/2017  . Bacteremia due to other bacteria 07/30/2017  . Neutropenic fever ([place]) 07/30/2017  . Acute kidney injury (nontraumatic) ([place])   . AKI (acute kidney injury) ([place])   . Abdominal pain 07/29/2017  . Acute renal failure (ARF) ([place]) 07/29/2017  . Leukopenia 07/29/2017  . Severe protein-calorie malnutrition ([place]) 10/26/2016  . Status post thyroidectomy 10/25/2016  . Asthma 10/25/2016  . 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 GERD (gastroesophageal reflux disease) 10/25/2016  . Candidal esophagitis ([place]) 10/25/2016  .  Intractable vomiting with nausea 10/24/2016  . Normal anion gap metabolic acidosis 10/93/2355  . Dysphagia 09/11/2015  . Noncompliance with medication regimen   . Esophageal stricture 12/06/2014  . 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 Hypokalemia 07/09/2013  . Bipolar disorder ([place]) 04/01/2012  . HIV disease ([place]) 02/28/2009  . Genital herpes 02/28/2009  . Microcytic anemia 02/27/2009    Past Surgical History:  Procedure Laterality Date  . BALLOON DILATION N/A 02/14/2015   Procedure: BALLOON DILATION;  Surgeon: Lear Ng, MD;  Location: [facility] ENDOSCOPY;  Service: Endoscopy;  Laterality: N/A;  . BALLOON DILATION N/A 10/25/2016   Procedure: BALLOON DILATION;  Surgeon:  Wilford Corner, MD;  Location: [facility] ENDOSCOPY;  Service: Endoscopy;  Laterality: N/A;  . BALLOON DILATION N/A 01/14/2017   Procedure: BALLOON DILATION;  Surgeon: Wilford Corner, MD;  Location: [facility] ENDOSCOPY;  Service: Endoscopy;  Laterality: N/A;  . BALLOON DILATION N/A 04/30/2017   Procedure: BALLOON DILATION;  Surgeon: Wilford Corner, MD;  Location: [facility] ENDOSCOPY;  Service: Endoscopy;  Laterality: N/A;  . ESOPHAGOGASTRODUODENOSCOPY N/A 07/13/2013   Procedure: ESOPHAGOGASTRODUODENOSCOPY (EGD);  Surgeon: Beryle Beams, MD;  Location: [facility] ENDOSCOPY;  Service: Endoscopy;  Laterality: N/A;  . ESOPHAGOGASTRODUODENOSCOPY N/A 08/12/2014   Procedure: ESOPHAGOGASTRODUODENOSCOPY (EGD);  Surgeon: Milus Banister, MD;  Location: [facility] ENDOSCOPY;  Service: Endoscopy;  Laterality: N/A;  . ESOPHAGOGASTRODUODENOSCOPY N/A 02/14/2015   Procedure: ESOPHAGOGASTRODUODENOSCOPY (EGD);  Surgeon: Lear Ng, MD;  Location: [facility] ENDOSCOPY;  Service: Endoscopy;  Laterality: N/A;  . ESOPHAGOGASTRODUODENOSCOPY N/A 10/25/2016   Procedure: ESOPHAGOGASTRODUODENOSCOPY (EGD);  Surgeon: Wilford Corner, MD;  Location: [facility] ENDOSCOPY;  Service: Endoscopy;  Laterality: N/A;  . ESOPHAGOGASTRODUODENOSCOPY (EGD) WITH PROPOFOL N/A 04/11/2015   Procedure: ESOPHAGOGASTRODUODENOSCOPY (EGD) WITH PROPOFOL;  Surgeon: Wilford Corner, MD;  Location: [facility] ENDOSCOPY;  Service: Endoscopy;  Laterality: N/A;  . ESOPHAGOGASTRODUODENOSCOPY (EGD) WITH PROPOFOL N/A 09/11/2015   Procedure: ESOPHAGOGASTRODUODENOSCOPY (EGD) WITH PROPOFOL;  Surgeon: Wilford Corner, MD;  Location: [facility] ENDOSCOPY;  Service: Endoscopy;  Laterality: N/A;  . ESOPHAGOGASTRODUODENOSCOPY (EGD) WITH PROPOFOL N/A 01/14/2017   Procedure: ESOPHAGOGASTRODUODENOSCOPY (EGD) WITH PROPOFOL;  Surgeon: Wilford Corner, MD;  Location: [facility] ENDOSCOPY;  Service: Endoscopy;  Laterality: N/A;  . ESOPHAGOGASTRODUODENOSCOPY (EGD) WITH PROPOFOL N/A 04/30/2017   Procedure: ESOPHAGOGASTRODUODENOSCOPY (EGD)  WITH PROPOFOL;  Surgeon: Wilford Corner, MD;  Location: [place] [facility];  Service: Endoscopy;  Laterality: N/A;  . SAVORY DILATION N/A 02/14/2015   Procedure: SAVORY DILATION;  Surgeon: Lear Ng, MD;  Location: [facility];  Service: Endoscopy;  Laterality: N/A;  no xray needed  . SAVORY DILATION N/A 04/11/2015   Procedure: SAVORY DILATION;  Surgeon: Wilford Corner, MD;  Location: [facility] ENDOSCOPY;  Service: Endoscopy;  Laterality: N/A;  . SAVORY DILATION N/A 09/11/2015   Procedure: SAVORY DILATION;  Surgeon: Wilford Corner, MD;  Location: [facility] ENDOSCOPY;  Service: Endoscopy;  Laterality: N/A;  . SAVORY DILATION N/A 04/30/2017   Procedure: SAVORY DILATION;  Surgeon: Wilford Corner, MD;  Location: [facility];  Service: Endoscopy;  Laterality: N/A;  . THYROID LOBECTOMY  08/2002   left & isthmectomy; for benign thyroid adenoma/E-chart  . THYROID SURGERY    . TUBAL LIGATION  07/2002   /E-chart    OB History    No data available       Home Medications    Prior to Admission medications   Medication Sig Start Date End Date Taking? Authorizing Provider  albuterol (PROAIR HFA) 108 (90 BASE) MCG/ACT inhaler Inhale 2 puffs into the lungs every 4 (four) hours as needed  for wheezing or shortness of breath (cough). 11/16/15   Gean Quint, MD  atovaquone [facility]) 750 MG/5ML suspension Take 10 mLs (1,500 mg total) by mouth daily with breakfast. 09/03/17   Carlyle Basques, MD  benztropine (COGENTIN) 1 MG tablet Take 1 tablet (1 mg total) by mouth daily. 11/12/16   Michel Bickers, MD  elvitegravir-cobicistat-emtricitabine-tenofovir (GENVOYA) 150-150-200-10 MG TABS tablet Take 1 tablet by mouth daily with breakfast. 11/12/16   Michel Bickers, MD  fluconazole (DIFLUCAN) 100 MG tablet Take 2 tablets (200 mg total) by mouth once a week. 08/05/17   Charlynne Cousins, MD  OLANZapine (ZYPREXA) 5 MG tablet Take 1 tablet (5 mg total) by mouth at bedtime. 11/12/16   Michel Bickers, MD  ondansetron  (ZOFRAN ODT) 8 MG disintegrating tablet Take 1 tablet (8 mg total) by mouth every 8 (eight) hours as needed for nausea or vomiting. 09/03/17   Carlyle Basques, MD  pantoprazole (PROTONIX) 40 MG tablet Take 1 tablet (40 mg total) by mouth daily. 11/12/16   Michel Bickers, MD  sucralfate ([name]) 1 g tablet Take 1 tablet (1 g total) by mouth 4 (four) times daily -  with meals and at bedtime. 04/03/17 07/29/17  Fatima Blank, MD  traMADol (ULTRAM) 50 MG tablet Take 1 tablet (50 mg total) by mouth every 6 (six) hours as needed for moderate pain. 08/05/17   Charlynne Cousins, MD    Family History Family History  Problem Relation Age of Onset  . Cancer Mother   . Diabetes Mother   . Diabetes Father   . Heart disease Father   . Diabetes Sister   . Urticaria Sister   . Asthma Son   . Allergic rhinitis Neg Hx   . Eczema Neg Hx   . Immunodeficiency Neg Hx     Social History Social History  Substance Use Topics  . Smoking status: Never Smoker  . Smokeless tobacco: Never Used  . Alcohol use No     Allergies   Dapsone; Other; Peanut-containing drug products; Shellfish allergy; Bactrim; Orange fruit; and Sulfa antibiotics   Review of Systems Review of Systems  All other systems reviewed and are negative.    Physical Exam Updated Vital Signs BP 129/87   Pulse (!) 103   Temp 98.7 F (37.1 C) (Oral)   Resp 15   Ht 6' (1.829 m)   Wt 57.6 kg (127 lb)   LMP 09/29/2017 Comment: pt. states not having sex  SpO2 100%   BMI 17.22 kg/m   Vital signs normal except tachycardia   Physical Exam  Constitutional: She is oriented to person, place, and time.  Non-toxic appearance. She does not appear ill.  Cachectic female  HENT:  Head: Normocephalic and atraumatic.  Right Ear: External ear normal.  Left Ear: External ear normal.  Nose: Nose normal. No mucosal edema or rhinorrhea.  Mouth/Throat: Mucous membranes are pale and dry. No dental abscesses or uvula swelling.  Dry  mucus membranes, pale mm  Eyes: Pupils are equal, round, and reactive to light. Conjunctivae and EOM are normal.  Conjunctiva pale  Neck: Normal range of motion and full passive range of motion without pain. Neck supple.  Cardiovascular: Regular rhythm and normal heart sounds.  Tachycardia present.  Exam reveals no gallop and no friction rub.   No murmur heard. Pulmonary/Chest: Effort normal and breath sounds normal. No respiratory distress. She has no wheezes. She has no rhonchi. She has no rales. She exhibits no tenderness and no crepitus.  Abdominal: Soft. Normal appearance and bowel sounds are normal. She exhibits no distension. There is no tenderness. There is no rebound and no guarding.  Musculoskeletal: Normal range of motion. She exhibits no edema or tenderness.  Moves all extremities well.   Neurological: She is alert and oriented to person, place, and time. She has normal strength. No cranial nerve deficit.  Skin: Skin is warm, dry and intact. No rash noted. No erythema. There is pallor.  Palms are pale  Psychiatric: She has a normal mood and affect. Her speech is normal and behavior is normal. Her mood appears not anxious.  Nursing note and vitals reviewed.    ED Treatments / Results  Labs (all labs ordered are listed, but only abnormal results are displayed) Results for orders placed or performed during the hospital encounter of 54/27/06  Basic metabolic panel  Result Value Ref Range   Sodium 135 135 - 145 mmol/L   Potassium 2.6 (LL) 3.5 - 5.1 mmol/L   Chloride 99 (L) 101 - 111 mmol/L   CO2 23 22 - 32 mmol/L   Glucose, Bld 96 65 - 99 mg/dL   BUN 15 6 - 20 mg/dL   Creatinine, Ser 0.93 0.44 - 1.00 mg/dL   Calcium 9.3 8.9 - 10.3 mg/dL   GFR calc non Af Amer >60 >60 mL/min   GFR calc Af Amer >60 >60 mL/min   Anion gap 13 5 - 15  CBC  Result Value Ref Range   WBC 2.1 (L) 4.0 - 10.5 K/uL   RBC 3.27 (L) 3.87 - 5.11 MIL/uL   Hemoglobin 6.8 (LL) 12.0 - 15.0 g/dL   HCT 22.7  (L) 36.0 - 46.0 %   MCV 69.4 (L) 78.0 - 100.0 fL   MCH 20.8 (L) 26.0 - 34.0 pg   MCHC 30.0 30.0 - 36.0 g/dL   RDW 19.0 (H) 11.5 - 15.5 %   Platelets 256 150 - 400 K/uL  Hepatic function panel  Result Value Ref Range   Total Protein 7.4 6.5 - 8.1 g/dL   Albumin 3.2 (L) 3.5 - 5.0 g/dL   AST 34 15 - 41 U/L   ALT 14 14 - 54 U/L   Alkaline Phosphatase 221 (H) 38 - 126 U/L   Total Bilirubin 1.2 0.3 - 1.2 mg/dL   Bilirubin, Direct 0.3 0.1 - 0.5 mg/dL   Indirect Bilirubin 0.9 0.3 - 0.9 mg/dL  Lipase, blood  Result Value Ref Range   Lipase 45 11 - 51 U/L  Magnesium  Result Value Ref Range   Magnesium 1.9 1.7 - 2.4 mg/dL  I-stat troponin, ED  Result Value Ref Range   Troponin i, poc 0.01 0.00 - 0.08 ng/mL   Comment 3          Type and screen [place]  Result Value Ref Range   ABO/RH(D) A POS    Antibody Screen NEG    Sample Expiration 10/16/2017    Unit Number [id number]    Blood Component Type RED CELLS,LR    Unit division 00    Status of Unit ISSUED    Transfusion Status OK TO TRANSFUSE    Crossmatch Result COMPATIBLE    Unit Number [id number]    Blood Component Type RED CELLS,LR    Unit division 00    Status of Unit ALLOCATED    Transfusion Status OK TO TRANSFUSE    Crossmatch Result COMPATIBLE   Prepare RBC  Result Value Ref Range   Order Confirmation  ORDER PROCESSED BY BLOOD BANK   BPAM RBC  Result Value Ref Range   ISSUE DATE / TIME 332951884166    Blood Product Unit Number [id number]    PRODUCT CODE E0336V00    Unit Type and Rh 5573    Blood Product Expiration Date [id number]    ISSUE DATE / TIME [id number]    Blood Product Unit Number [id number]    PRODUCT CODE [id number]    Unit Type and Rh 6200    Blood Product Expiration Date [id number]    Laboratory interpretation all normal except for anemia, hypokalemia, low total white blood cell count, malnutrition   EKG  EKG Interpretation  Date/Time:  Sunday October 12 2017  23:33:24 EDT Ventricular Rate:  123 PR Interval:    QRS Duration: 86 QT Interval:  428 QTC Calculation: 612 R Axis:   87 Text Interpretation:  Critical Test Result: Long QTc Accelerated Junctional rhythm Nonspecific ST abnormality Since last tracing rate faster 14 Oct 2016 Confirmed by Rolland Porter [phone]) on 10/13/2017 12:36:23 AM       Radiology Dg Chest 2 View  Result Date: 10/13/2017 CLINICAL DATA:  Chest pain. EXAM: CHEST  2 VIEW COMPARISON:  Radiograph 07/30/2017 FINDINGS: The cardiomediastinal contours are normal. The lungs are clear. Pulmonary vasculature is normal. No consolidation, pleural effusion, or pneumothorax. No acute osseous abnormalities are seen. IMPRESSION: No active cardiopulmonary disease. Electronically Signed   By: Jeb Levering M.D.   On: 10/13/2017 00:11    Procedures Procedures (including critical care time)  Medications Ordered in ED Medications  potassium chloride 10 mEq in 100 mL IVPB (10 mEq Intravenous New Bag/Given 10/13/17 0416)  sodium chloride 0.9 % bolus 1,000 mL (0 mLs Intravenous Stopped 10/13/17 0421)  sodium chloride 0.9 % bolus 1,000 mL (0 mLs Intravenous Stopped 10/13/17 0427)  ketorolac (TORADOL) 15 MG/ML injection 15 mg (15 mg Intravenous Given 10/13/17 0311)     Initial Impression / Assessment and Plan / ED Course  I have reviewed the triage vital signs and the nursing notes.  Pertinent labs & imaging results that were available during my care of the patient were reviewed by me and considered in my medical decision making (see chart for details).     We discussed her test results specifically she is very anemic again and her potassium is very low from the vomiting.  Due to her saying she has gallbladder problems a lipase and liver function test was added to her lab work.  Due to her hypokalemia and prolonged QTC on her EKG a magnesium level was also added  Patient was given 2 L bolus of normal saline for dehydration.  She was  started on IV potassium chloride 10 mg/h runs x4, she was given Toradol for pain.  She was typed and crossed for 2 units of packed red blood cells.  Patient's QTC should improve as her tachycardia improves and her potassium is repleted.  Review of her chart in October, patient had lab work done on August 14 showing her TIBC, iron, and folic acid levels were low, her ferritin and vitamin B12 levels were high.  Her anemia was felt to be due to her untreated HIV disease because patient is noncompliant with her medications.  IV access was difficult, IV team had to start her IV.  Due to her requiring blood transfusions and potassium infusions due to her vomiting, and also patient states she cannot take potassium pills, she was at admitted.  04:25 AM Dr Maudie Mercury, hospitalist, will  admit to observation  Final Clinical Impressions(s) / ED Diagnoses   Final diagnoses:  Anemia, unspecified type  Hypokalemia  Chest wall pain  Non-intractable vomiting with nausea, unspecified vomiting type    Plan admission  Rolland Porter, MD, Barbette Or, MD 10/13/17 0430

## 2017-10-13 NOTE — Progress Notes (Signed)
Report received from the ED. Pt arrived to the unit at 1650.

## 2017-10-14 DIAGNOSIS — E860 Dehydration: Secondary | ICD-10-CM | POA: Diagnosis present

## 2017-10-14 DIAGNOSIS — A419 Sepsis, unspecified organism: Secondary | ICD-10-CM | POA: Diagnosis present

## 2017-10-14 DIAGNOSIS — D46Z Other myelodysplastic syndromes: Secondary | ICD-10-CM | POA: Diagnosis not present

## 2017-10-14 DIAGNOSIS — F419 Anxiety disorder, unspecified: Secondary | ICD-10-CM | POA: Diagnosis present

## 2017-10-14 DIAGNOSIS — I10 Essential (primary) hypertension: Secondary | ICD-10-CM | POA: Diagnosis present

## 2017-10-14 DIAGNOSIS — M549 Dorsalgia, unspecified: Secondary | ICD-10-CM | POA: Diagnosis not present

## 2017-10-14 DIAGNOSIS — Z79890 Hormone replacement therapy (postmenopausal): Secondary | ICD-10-CM | POA: Diagnosis not present

## 2017-10-14 DIAGNOSIS — K828 Other specified diseases of gallbladder: Secondary | ICD-10-CM | POA: Diagnosis present

## 2017-10-14 DIAGNOSIS — D573 Sickle-cell trait: Secondary | ICD-10-CM | POA: Diagnosis present

## 2017-10-14 DIAGNOSIS — B20 Human immunodeficiency virus [HIV] disease: Secondary | ICD-10-CM | POA: Diagnosis present

## 2017-10-14 DIAGNOSIS — D649 Anemia, unspecified: Secondary | ICD-10-CM | POA: Diagnosis not present

## 2017-10-14 DIAGNOSIS — D72819 Decreased white blood cell count, unspecified: Secondary | ICD-10-CM | POA: Diagnosis not present

## 2017-10-14 DIAGNOSIS — Z9889 Other specified postprocedural states: Secondary | ICD-10-CM | POA: Diagnosis not present

## 2017-10-14 DIAGNOSIS — R64 Cachexia: Secondary | ICD-10-CM | POA: Diagnosis present

## 2017-10-14 DIAGNOSIS — F319 Bipolar disorder, unspecified: Secondary | ICD-10-CM | POA: Diagnosis present

## 2017-10-14 DIAGNOSIS — Z9049 Acquired absence of other specified parts of digestive tract: Secondary | ICD-10-CM | POA: Diagnosis not present

## 2017-10-14 DIAGNOSIS — B3781 Candidal esophagitis: Secondary | ICD-10-CM | POA: Diagnosis not present

## 2017-10-14 DIAGNOSIS — R112 Nausea with vomiting, unspecified: Secondary | ICD-10-CM | POA: Diagnosis not present

## 2017-10-14 DIAGNOSIS — Z9114 Patient's other noncompliance with medication regimen: Secondary | ICD-10-CM | POA: Diagnosis not present

## 2017-10-14 DIAGNOSIS — K811 Chronic cholecystitis: Secondary | ICD-10-CM | POA: Diagnosis present

## 2017-10-14 DIAGNOSIS — R52 Pain, unspecified: Secondary | ICD-10-CM | POA: Diagnosis not present

## 2017-10-14 DIAGNOSIS — D709 Neutropenia, unspecified: Secondary | ICD-10-CM | POA: Diagnosis not present

## 2017-10-14 DIAGNOSIS — D508 Other iron deficiency anemias: Secondary | ICD-10-CM | POA: Diagnosis not present

## 2017-10-14 DIAGNOSIS — D509 Iron deficiency anemia, unspecified: Secondary | ICD-10-CM | POA: Diagnosis present

## 2017-10-14 DIAGNOSIS — R161 Splenomegaly, not elsewhere classified: Secondary | ICD-10-CM | POA: Diagnosis present

## 2017-10-14 DIAGNOSIS — B370 Candidal stomatitis: Secondary | ICD-10-CM | POA: Diagnosis present

## 2017-10-14 DIAGNOSIS — Z681 Body mass index (BMI) 19 or less, adult: Secondary | ICD-10-CM | POA: Diagnosis not present

## 2017-10-14 DIAGNOSIS — R590 Localized enlarged lymph nodes: Secondary | ICD-10-CM | POA: Diagnosis present

## 2017-10-14 DIAGNOSIS — E890 Postprocedural hypothyroidism: Secondary | ICD-10-CM | POA: Diagnosis present

## 2017-10-14 DIAGNOSIS — R0789 Other chest pain: Secondary | ICD-10-CM | POA: Diagnosis not present

## 2017-10-14 DIAGNOSIS — R5081 Fever presenting with conditions classified elsewhere: Secondary | ICD-10-CM | POA: Diagnosis present

## 2017-10-14 DIAGNOSIS — R1319 Other dysphagia: Secondary | ICD-10-CM | POA: Diagnosis present

## 2017-10-14 DIAGNOSIS — R197 Diarrhea, unspecified: Secondary | ICD-10-CM | POA: Diagnosis not present

## 2017-10-14 DIAGNOSIS — K210 Gastro-esophageal reflux disease with esophagitis: Secondary | ICD-10-CM | POA: Diagnosis present

## 2017-10-14 DIAGNOSIS — E43 Unspecified severe protein-calorie malnutrition: Secondary | ICD-10-CM | POA: Diagnosis present

## 2017-10-14 DIAGNOSIS — K829 Disease of gallbladder, unspecified: Secondary | ICD-10-CM | POA: Diagnosis not present

## 2017-10-14 DIAGNOSIS — R109 Unspecified abdominal pain: Secondary | ICD-10-CM | POA: Diagnosis not present

## 2017-10-14 DIAGNOSIS — E876 Hypokalemia: Secondary | ICD-10-CM | POA: Diagnosis present

## 2017-10-14 DIAGNOSIS — B1910 Unspecified viral hepatitis B without hepatic coma: Secondary | ICD-10-CM | POA: Diagnosis present

## 2017-10-14 DIAGNOSIS — Z23 Encounter for immunization: Secondary | ICD-10-CM | POA: Diagnosis not present

## 2017-10-14 DIAGNOSIS — R509 Fever, unspecified: Secondary | ICD-10-CM | POA: Diagnosis not present

## 2017-10-14 LAB — BPAM RBC
BLOOD PRODUCT EXPIRATION DATE: 201811102359
Blood Product Expiration Date: 201811102359
ISSUE DATE / TIME: 201810290347
ISSUE DATE / TIME: 201810290940
Unit Type and Rh: 6200
Unit Type and Rh: 6200

## 2017-10-14 LAB — COMPREHENSIVE METABOLIC PANEL
ALBUMIN: 2.3 g/dL — AB (ref 3.5–5.0)
ALT: 13 U/L — ABNORMAL LOW (ref 14–54)
ANION GAP: 7 (ref 5–15)
AST: 30 U/L (ref 15–41)
Alkaline Phosphatase: 165 U/L — ABNORMAL HIGH (ref 38–126)
BUN: 10 mg/dL (ref 6–20)
CALCIUM: 8.2 mg/dL — AB (ref 8.9–10.3)
CHLORIDE: 111 mmol/L (ref 101–111)
CO2: 20 mmol/L — ABNORMAL LOW (ref 22–32)
Creatinine, Ser: 0.79 mg/dL (ref 0.44–1.00)
GFR calc Af Amer: >60 mL/min (ref >60)
GLUCOSE: 81 mg/dL (ref 65–99)
POTASSIUM: 3.9 mmol/L (ref 3.5–5.1)
Sodium: 138 mmol/L (ref 135–145)
TOTAL PROTEIN: 5.4 g/dL — AB (ref 6.5–8.1)
Total Bilirubin: 1.2 mg/dL (ref 0.3–1.2)

## 2017-10-14 LAB — TYPE AND SCREEN
ABO/RH(D): A POS
ANTIBODY SCREEN: NEGATIVE
UNIT DIVISION: 0
UNIT DIVISION: 0

## 2017-10-14 LAB — T-HELPER CELLS (CD4) COUNT (NOT AT ARMC)
CD4 % Helper T Cell: 6 % — ABNORMAL LOW (ref 33–55)
CD4 T Cell Abs: 10 /uL — ABNORMAL LOW (ref 400–2700)

## 2017-10-14 LAB — CBC
HCT: 26.4 % — ABNORMAL LOW (ref 36.0–46.0)
Hemoglobin: 8.3 g/dL — ABNORMAL LOW (ref 12.0–15.0)
MCH: 23.6 pg — ABNORMAL LOW (ref 26.0–34.0)
MCHC: 31.4 g/dL (ref 30.0–36.0)
MCV: 75 fL — ABNORMAL LOW (ref 78.0–100.0)
Platelets: 175 10*3/uL (ref 150–400)
RBC: 3.52 MIL/uL — ABNORMAL LOW (ref 3.87–5.11)
RDW: 20.6 % — AB (ref 11.5–15.5)
WBC: 1.3 10*3/uL — AB (ref 4.0–10.5)

## 2017-10-14 LAB — TROPONIN I

## 2017-10-14 MED ORDER — BENZTROPINE MESYLATE 1 MG PO TABS
1.0000 mg | ORAL_TABLET | Freq: Every day | ORAL | Status: DC
Start: 2017-10-14 — End: 2017-10-24
  Administered 2017-10-14 – 2017-10-24 (×9): 1 mg via ORAL
  Filled 2017-10-14 (×10): qty 1

## 2017-10-14 MED ORDER — DAPSONE 100 MG PO TABS
100.0000 mg | ORAL_TABLET | Freq: Every day | ORAL | Status: DC
  Administered 2017-10-14 – 2017-10-24 (×10): 100 mg via ORAL
  Filled 2017-10-14 (×14): qty 1

## 2017-10-14 MED ORDER — POTASSIUM CHLORIDE CRYS ER 20 MEQ PO TBCR
40.0000 meq | EXTENDED_RELEASE_TABLET | Freq: Once | ORAL | Status: DC
  Filled 2017-10-14: qty 2

## 2017-10-14 MED ORDER — BICTEGRAVIR-EMTRICITAB-TENOFOV 50-200-25 MG PO TABS
1.0000 | ORAL_TABLET | Freq: Every day | ORAL | Status: DC
  Administered 2017-10-14 – 2017-10-24 (×10): 1 via ORAL
  Filled 2017-10-14 (×11): qty 1

## 2017-10-14 MED ORDER — BOOST / RESOURCE BREEZE PO LIQD
1.0000 | Freq: Three times a day (TID) | ORAL | Status: DC
  Administered 2017-10-14 – 2017-10-24 (×12): 1 via ORAL

## 2017-10-14 MED ORDER — ADULT MULTIVITAMIN LIQUID CH
15.0000 mL | Freq: Every day | ORAL | Status: DC
  Administered 2017-10-14 – 2017-10-17 (×4): 15 mL via ORAL
  Filled 2017-10-14 (×11): qty 15

## 2017-10-14 MED ORDER — ADULT MULTIVITAMIN LIQUID CH
15.0000 mL | Freq: Every day | ORAL | Status: DC
  Filled 2017-10-14: qty 15

## 2017-10-14 MED ORDER — PNEUMOCOCCAL VAC POLYVALENT 25 MCG/0.5ML IJ INJ
0.5000 mL | INJECTION | INTRAMUSCULAR | Status: AC
  Administered 2017-10-17: 0.5 mL via INTRAMUSCULAR
  Filled 2017-10-14: qty 0.5

## 2017-10-14 MED ORDER — POTASSIUM CHLORIDE IN NACL 40-0.9 MEQ/L-% IV SOLN
INTRAVENOUS | Status: AC
  Administered 2017-10-14 – 2017-10-15 (×3): 75 mL/h via INTRAVENOUS
  Filled 2017-10-14 (×2): qty 1000

## 2017-10-14 MED ORDER — OLANZAPINE 5 MG PO TABS
5.0000 mg | ORAL_TABLET | Freq: Every day | ORAL | Status: DC
  Administered 2017-10-14 – 2017-10-23 (×7): 5 mg via ORAL
  Filled 2017-10-14 (×11): qty 1

## 2017-10-14 NOTE — Progress Notes (Signed)
PROGRESS NOTE    [name]  WNI:[id number] DOB: 1976-10-02 DOA: 10/13/2017 PCP: Nolene Ebbs, MD   Outpatient Specialists:     Brief Narrative:  [name]  is a 41 y.o. female, w HIV, Hepatitis B, Gerd, Esophageal stenosis , Esophageal candidiasis apparently presents w chest pain.  "sharp pain"  Started 3 weeks ago.  Fairly constant.  + nausea and some vomitting  Starting 3 weeks ago.  Some diarrhea starting yesterday.  "loose stool"  Had 2 loose stool yesterday.  Slight epigastric discomfort as well over the past 3 week,  Denies fever, chills, constipation, brbpr, black stool.   Pt has not been taking her pantoprazole for the past 1 week.    Assessment & Plan:   Active Problems:   Anemia   Nausea & vomiting  N/v/ abdominal pain zofran CT scan abd/ pelvis: Sludge versus stones in the gallbladder. Pericholecystic fluid is suspected.  Start protonix 40mg  iv bid Hydrate with ns iv Discussed with Dr. Tami Lin of [facility]-- no need to see currently, would continue with plan of cholecystectomy Last hospitalization: HIDA showed 9.2% -plan for cholecystectomy later this week  Anemia- ? Chronic disease Transfusion ordered by ED Repeat cbc stable  Hiv/  Hepatitis B ID to see and resume meds -patient has not been taking medications for last 1 month as she is moving--? Story appears to change frequently as first, she could not tolerate meds  Gerd Protonix IV  H/o esophageal candidiasis Patient states she has been taking weekly fluconazole  bipolar -resume home meds  DVT prophylaxis:  SCD's  Code Status: Full Code   Family Communication:   Disposition Plan:     Consultants:   ID  GI  (phone)  surgery  Procedures:      Subjective:   Objective: Vitals:   10/13/17 1841 10/13/17 2040 10/14/17 0533 10/14/17 1431  BP: (!) 147/91 (!) 159/92 (!) 143/93 137/88  Pulse: 66 78 61 65  Resp: 16 17 16 20   Temp: 98 F (36.7 C) 97.8 F (36.6 C) (!)  97.4 F (36.3 C) 97.9 F (36.6 C)  TempSrc: Oral Oral Oral Oral  SpO2: 100% 100% 100% 100%  Weight:   52.8 kg (116 lb 4.8 oz)   Height:        Intake/Output Summary (Last 24 hours) at 10/14/17 1448 Last data filed at 10/14/17 0345  Gross per 24 hour  Intake              970 ml  Output              500 ml  Net              470 ml   Filed Weights   10/12/17 2329 10/14/17 0533  Weight: 57.6 kg (127 lb) 52.8 kg (116 lb 4.8 oz)    Examination:  General exam: poor eye contact, NAD Respiratory system: Clear to auscultation. Respiratory effort normal. Cardiovascular system: S1 & S2 heard, RRR. No JVD, murmurs, rubs, gallops or clicks. No pedal edema. Gastrointestinal system: tender to palpationCentral nervous system: Alert and oriented. No focal neurological deficits. Extremities: Symmetric 5 x 5 power. Psychiatry: poor eye contact    Data Reviewed: I have personally reviewed following labs and imaging studies  CBC:  Recent Labs Lab 10/12/17 2341 10/13/17 1830 10/14/17 0633  WBC 2.1* 1.5* 1.3*  HGB 6.8* 8.7* 8.3*  HCT 22.7* 27.8* 26.4*  MCV 69.4* 75.5* 75.0*  PLT 256 211 175   Basic  Metabolic Panel:  Recent Labs Lab 10/12/17 2341 10/13/17 0159 10/13/17 1830 10/14/17 0633  NA 135  --  138 138  K 2.6*  --  3.4* 3.9  CL 99*  --  108 111  CO2 23  --  18* 20*  GLUCOSE 96  --  81 81  BUN 15  --  13 10  CREATININE 0.93  --  0.79 0.79  CALCIUM 9.3  --  8.4* 8.2*  MG  --  1.9  --   --    GFR: Estimated Creatinine Clearance: 77.9 mL/min (by C-G formula based on SCr of 0.79 mg/dL). Liver Function Tests:  Recent Labs Lab 10/12/17 2341 10/14/17 0633  AST 34 30  ALT 14 13*  ALKPHOS 221* 165*  BILITOT 1.2 1.2  PROT 7.4 5.4*  ALBUMIN 3.2* 2.3*    Recent Labs Lab 10/12/17 2341  LIPASE 45   No results for input(s): AMMONIA in the last 168 hours. Coagulation Profile: No results for input(s): INR, PROTIME in the last 168 hours. Cardiac Enzymes:  Recent  Labs Lab 10/13/17 0728 10/13/17 1830 10/14/17 0052 10/14/17 0633  TROPONINI <0.03 <0.03 <0.03 <0.03   BNP (last 3 results) No results for input(s): PROBNP in the last 8760 hours. HbA1C: No results for input(s): HGBA1C in the last 72 hours. CBG: No results for input(s): GLUCAP in the last 168 hours. Lipid Profile: No results for input(s): CHOL, HDL, LDLCALC, TRIG, CHOLHDL, LDLDIRECT in the last 72 hours. Thyroid Function Tests: No results for input(s): TSH, T4TOTAL, FREET4, T3FREE, THYROIDAB in the last 72 hours. Anemia Panel: No results for input(s): VITAMINB12, FOLATE, FERRITIN, TIBC, IRON, RETICCTPCT in the last 72 hours. Urine analysis:    Component Value Date/Time   COLORURINE YELLOW 07/29/2017 1357   APPEARANCEUR HAZY (A) 07/29/2017 1357   LABSPEC 1.009 07/29/2017 1357   PHURINE 6.0 07/29/2017 1357   GLUCOSEU NEGATIVE 07/29/2017 1357   HGBUR LARGE (A) 07/29/2017 1357   BILIRUBINUR NEGATIVE 07/29/2017 1357   KETONESUR 5 (A) 07/29/2017 1357   PROTEINUR 100 (A) 07/29/2017 1357   UROBILINOGEN 0.2 12/07/2014 1441   NITRITE NEGATIVE 07/29/2017 1357   LEUKOCYTESUR LARGE (A) 07/29/2017 1357     )No results found for this or any previous visit (from the past 240 hour(s)).    Anti-infectives    Start     Dose/Rate Route Frequency Ordered Stop   10/13/17 1015  fluconazole (DIFLUCAN) tablet 200 mg     200 mg Oral Weekly 10/13/17 1006         Radiology Studies: Ct Abdomen Pelvis Wo Contrast  Result Date: 10/13/2017 CLINICAL DATA:  Diffuse abdominal pain. Nausea and vomiting. Symptoms for 18 days. Patient refused oral contrast. EXAM: CT ABDOMEN AND PELVIS WITHOUT CONTRAST TECHNIQUE: Multidetector CT imaging of the abdomen and pelvis was performed following the standard protocol without IV contrast. COMPARISON:  10/16/2016 FINDINGS: The study is severely limited by lack of both intravenous and oral contrast. Lower chest: No acute abnormality.  The left ventricle is dilated.  Hepatobiliary: Liver is within normal limits. Sludge versus stones layering in the gallbladder. Pericholecystic fluid is suggested. Pancreas: Unremarkable Spleen: Unremarkable Adrenals/Urinary Tract: Adrenal glands and kidneys are unremarkable. Bladder is unremarkable. Stomach/Bowel: Stomach and duodenum are grossly within normal limits. There is no disproportionate dilatation of small bowel. Normal appendix in the right lower quadrant. There is no obvious mass in the colon. Vascular/Lymphatic: No evidence of aortic aneurysm. Several small oval densities are seen to the left of the abdominal  aorta. Lymph nodes of indeterminate size cannot be excluded. Reproductive: The uterus is grossly within normal limits. Other: There is no free fluid. Musculoskeletal: L5-S1 degenerative disc disease with vacuum is noted. No obvious spinal stenosis. IMPRESSION: Sludge versus stones in the gallbladder. Pericholecystic fluid is suspected. Ultrasound can be performed to further delineate. Chronic changes. Exam is limited by lack of both intravenous and oral contrast. Electronically Signed   By: Marybelle Killings M.D.   On: 10/13/2017 09:26   Dg Chest 2 View  Result Date: 10/13/2017 CLINICAL DATA:  Chest pain. EXAM: CHEST  2 VIEW COMPARISON:  Radiograph 07/30/2017 FINDINGS: The cardiomediastinal contours are normal. The lungs are clear. Pulmonary vasculature is normal. No consolidation, pleural effusion, or pneumothorax. No acute osseous abnormalities are seen. IMPRESSION: No active cardiopulmonary disease. Electronically Signed   By: Jeb Levering M.D.   On: 10/13/2017 00:11        Scheduled Meds: . fluconazole  200 mg Oral Weekly  . pantoprazole (PROTONIX) IV  40 mg Intravenous Q12H  . [START ON 10/15/2017] pneumococcal 23 valent vaccine  0.5 mL Intramuscular Tomorrow-1000  . potassium chloride  40 mEq Oral Once  . sucralfate  1 g Oral TID WC & HS   Continuous Infusions: . 0.9 % NaCl with KCl 40 mEq / L        LOS: 0 days    Time spent: 25 min    [place], DO [facility] Pager [phone]  If 7PM-7AM, please contact night-coverage [email or web address] Password [email or web address] 10/14/2017, 2:48 PM

## 2017-10-14 NOTE — Progress Notes (Addendum)
Initial Nutrition Assessment  DOCUMENTATION CODES:   Severe malnutrition in context of chronic illness, Underweight  INTERVENTION:  1. Boost Breeze po TID, each supplement provides 250 kcal and 9 grams of protein 2. MVI w/ Minerals  NUTRITION DIAGNOSIS:   Severe Malnutrition related to chronic illness as evidenced by energy intake < 75% for > or equal to 1 month, percent weight loss. 12% severe  GOAL:   Patient will meet greater than or equal to 90% of their needs  MONITOR:   PO intake, I & O's, Labs, Supplement acceptance, Weight trends  REASON FOR ASSESSMENT:   Malnutrition Screening Tool    ASSESSMENT:   [name] has PMH of HIV, Hepatitis B, Esophageal stenosis, candidiasis,presents with  Chest pain, nausea & vomiting x3 weeks, diarrhea yesterday  Spoke with patient at bedside. She reports her last "meal" being September 9th. Patient has had RUQ pain since then, intermittent nausea and vomiting for 2-3 days at a time. States she has lost weight from 132 pounds to 116 pounds, a severe 12% weight loss for timeframe. Had her esophagus diltaed in May, patient reports losing weight from 175 - 132 pounds prior to dilation.  Reports being unable to tolerate ensure, causes gas. Normally eats grits, bacon and 2 eggs scrambled with cheese for breakfast. [place] sandiwch with cheese and a tomato, or a salad for lunch. May skip dinner and snack on cheese-its or she will cook vegetables, mashed potatoes, creamed corn, and occasionally, a meat, for dinner.  Had some clear liquids this morning. Still has severe RUQ pain, some nausea. Open to drinking boost breeze.  Labs reviewed  Medications reviewed and include:  NS 40 K+ at 43mL/hr  NUTRITION - FOCUSED PHYSICAL EXAM:  NFPE WNL, exhibits some scaly, dry skin  Diet Order:  DIET SOFT Room service appropriate? Yes; Fluid consistency: Thin Diet NPO time specified Except for: Sips with Meds  EDUCATION NEEDS:   Education needs  have been addressed  Skin:  Skin Assessment: Reviewed RN Assessment  Last BM:  10/12/2017  Height:   Ht Readings from Last 1 Encounters:  10/15/17 6' (1.829 m)    Weight:   Wt Readings from Last 1 Encounters:  10/22/17 117 lb 14.4 oz (53.5 kg)    Ideal Body Weight:  72.72 kg  BMI:  Body mass index is 15.99 kg/m.  Estimated Nutritional Needs:   Kcal:  1800-2100 calories (35-40 cal/kg)  Protein:  98-109 grams (1.9-2.1g/kg)  Fluid:  1.8-2.1L    [name]. Madison Rowland, MS, RD LDN Inpatient Clinical Dietitian Pager [phone]

## 2017-10-14 NOTE — Consult Note (Signed)
[place] for Infectious Disease    Date of Admission:  10/13/2017     Total days of antibiotics  0               Reason for Consult: HIV disease, uncontrolled    Referring Provider: Dr. Eliseo Squires Primary Care Provider: Nolene Ebbs, MD    Assessment: [name] with advanced HIV/AIDS (VL > 4.5 million) with gallbladder dysfunction (HIDA 9.2% EF 07/2017) and anemia. It is listed in her chart she is HepB + however her HepBcAb and sAg are both negative. Long discussion with her about medication adherence and making her HIV regimen most tolerable for her. Seems the biggest barrier to taking medication adherently is the food criteria - will try STR that offers no food requirement. She also is requesting to take her dapsone for OI proph as she tolerated re-trailing it herself at home without rash.   Plan: 1. Surgery tentatively planned later this week for cholecystectomy - worry about how she will do post-op with severely depleted CD4 count (10), however she is willing to take this risk as she does not seem to be improving with medical management.  2. Start dapsone and Biktarvy once a day.  3. Would like to get her to meet our new counselor in our clinic for long-term counseling sessions should she be agreeable to this.  4. Would like to get her working with Delories Heinz again (last worked with her in 2016) if she is open to this.    Active Problems:   Anemia   Nausea & vomiting   . benztropine  1 mg Oral Daily  . fluconazole  200 mg Oral Weekly  . OLANZapine  5 mg Oral QHS  . pantoprazole (PROTONIX) IV  40 mg Intravenous Q12H  . [START ON 10/15/2017] pneumococcal 23 valent vaccine  0.5 mL Intramuscular Tomorrow-1000  . potassium chloride  40 mEq Oral Once  . sucralfate  1 g Oral TID WC & HS    HPI: [name] is a 41 y.o. female that presented to the hospital on 10/13/2017 for chest pain, nausea, vomiting and diarrhea. PMHx significant for advanced HIV/AIDS, GERD,  esophageal candidiasis.   Hospital Course: EKG with ST 120s w/o signs of ischemia, negative troponin, negative CXR. Hgb 6.8, alk phos 221, TBili 1.2. CT scan of abd/pelvis wo contrast limited - pericholecystic fluid.   She was last seen in ED in August for RUQ pain x 28massociated with emesis and weight loss. EGD 04/30/17 s/p esophageal dilation in setting of stricture and candidiasis. Last in clinic to see Dr. SBaxter Flattery9/18/18 - she was taking her Genvoya sporadically ~4 days a week, was recommended to take Mepron for PCP prophylaxis as [facility] although she discussed with our clinic pharmacist that she would rather go back on the dapsone than swallow the liquid. Ms. BLembergerhas been working with Dr. HExcell Seltzerfor elective cholecystectomy - nuclear med study 07/2017 with reduced EF at 9.2%. CD4 count 10. WBC 1.3k. .Marland Kitchen Since admission she has felt slightly improved although persists with severe abdominal pain. No fevers or chills. Cannot tolerate cold liquids or large meals. Frequently snacks throughout the day to get some calories in. She is hopeful that our team will approve her for surgery to get her gallbladder out since she strongly feels that she cannot improve until this happens.   She tells me she has never been undetectable since diagnosed with HIV - "  was close once." When I asked her why she thought this was the case she told me she has a big barrier to taking pills with food and at the same time every day. Last conversation with our team (which she can recount well to me) was to stop taking her Genvoya as there is suspicion for resistance considering her viral load and reported spoty adherence. She tells me she just shared her diagnosis with her 54yo son for the first time and he has not been back to see her in the hospital since that time which she is angry about.   Review of Systems: Review of Systems  Constitutional: Positive for weight loss. Negative for chills and  fever.  HENT: Positive for sore throat.   Eyes: Negative for blurred vision and double vision.  Respiratory: Negative for cough, sputum production and shortness of breath.   Cardiovascular: Negative for chest pain and leg swelling.  Gastrointestinal: Positive for abdominal pain. Negative for blood in stool, constipation and diarrhea.  Genitourinary: Negative for dysuria.  Musculoskeletal: Negative for neck pain.  Skin: Negative for rash.  Neurological: Positive for weakness. Negative for dizziness and headaches.    Past Medical History:  Diagnosis Date  . Acute psychosis ([place]) 03/10/12   2nd admission in last wk for this  . Angio-edema   . Anxiety   . Asthma    inhaler 2xday  . Bipolar disorder ([place])   . Depression   . GERD (gastroesophageal reflux disease)   . Hepatitis B    /E-chart  . History of blood transfusion   . HIV positive ([place])   . Microcytic anemia    h/o per E-chart  . Noncompliance with medication regimen    /e-chart  . 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 Pyelonephritis    h/o per E-chart  . Shortness of breath dyspnea    due to Asthma  . Thyroid disease   . Urticaria     Social History  Substance Use Topics  . Smoking status: Never Smoker  . Smokeless tobacco: Never Used  . Alcohol use No    Family History  Problem Relation Age of Onset  . Cancer Mother   . Diabetes Mother   . Diabetes Father   . Heart disease Father   . Diabetes Sister   . Urticaria Sister   . Asthma Son   . Allergic rhinitis Neg Hx   . Eczema Neg Hx   . Immunodeficiency Neg Hx    Allergies  Allergen Reactions  . Dapsone Itching and Rash  . Other Hives and Itching    Berries, TREE NUTS  . Peanut-Containing Drug Products Hives  . Shellfish Allergy Hives  . Bactrim Itching  . Orange Fruit Itching  . Sulfa Antibiotics Itching    OBJECTIVE: Blood pressure 137/88, pulse 65, temperature 97.9 F (36.6 C), temperature  source Oral, resp. rate 20, height 6' (1.829 m), weight 116 lb 4.8 oz (52.8 kg), last menstrual period 09/29/2017, SpO2 100 %.  Physical Exam  Constitutional: She is oriented to person, place, and time.  Thin appearing AA woman. In visible pain during assessment   HENT:  Head: Normocephalic.  Mouth/Throat: No oral lesions. No dental abscesses.  Mild thrush   Eyes: Pupils are equal, round, and reactive to light. No scleral icterus.  Neck: Normal range of motion.  Cardiovascular: Normal rate, regular rhythm and normal heart sounds.   No murmur heard. Pulmonary/Chest: Effort normal and breath sounds  normal.  Abdominal: Soft. Bowel sounds are normal. She exhibits no distension. There is tenderness. There is guarding.  Musculoskeletal: Normal range of motion. She exhibits no tenderness.  Lymphadenopathy:    She has no cervical adenopathy.  Neurological: She is alert and oriented to person, place, and time.  Skin: Skin is warm and dry. No rash noted.  Psychiatric: Mood, affect and judgment normal.    Lab Results  HIV 1 RNA Quant  Date Value  09/03/2017 4,400,000 Copies/mL (H)  07/29/2017 849,000 copies/mL  11/12/2016 5,404 copies/mL (H)   CD4 T Cell Abs (/uL)  Date Value  10/13/2017 10 (L)  09/03/2017 20 (L)  07/29/2017 10 (L)    Lab Results  Component Value Date   WBC 1.3 (LL) 10/14/2017   HGB 8.3 (L) 10/14/2017   HCT 26.4 (L) 10/14/2017   MCV 75.0 (L) 10/14/2017   PLT 175 10/14/2017    Lab Results  Component Value Date   CREATININE 0.79 10/14/2017   BUN 10 10/14/2017   NA 138 10/14/2017   K 3.9 10/14/2017   CL 111 10/14/2017   CO2 20 (L) 10/14/2017    Lab Results  Component Value Date   ALT 13 (L) 10/14/2017   AST 30 10/14/2017   ALKPHOS 165 (H) 10/14/2017   BILITOT 1.2 10/14/2017     Microbiology: No results found for this or any previous visit (from the past 240 hour(s)).  Janene Madeira, MSN, NP-C [facility] for Infectious [place] [facility] Pager: [phone]  10/14/2017 3:24 PM

## 2017-10-14 NOTE — Progress Notes (Signed)
Pt. Has a critical WBC of 1.3. MD. Eliseo Squires notified.

## 2017-10-14 NOTE — Progress Notes (Signed)
[facility] Progress Note     Subjective: CC:  Endorses RUQ, epigastric pain. Also pain between shoulder blades in her back. Having post-prandial nausea and intermittent emesis. Drinking clears. Last [facility] Saturday 10/27 was loose and non-bloody. Denies hematemesis, melena, hematochezia.  Objective: Vital signs in last 24 hours: Temp:  [97.4 F (36.3 C)-98 F (36.7 C)] 97.4 F (36.3 C) (10/30 0533) Pulse Rate:  [61-78] 61 (10/30 0533) Resp:  [11-17] 16 (10/30 0533) BP: (139-162)/(86-101) 143/93 (10/30 0533) SpO2:  [100 %] 100 % (10/30 0533) Weight:  [52.8 kg (116 lb 4.8 oz)] 52.8 kg (116 lb 4.8 oz) (10/30 0533) Last BM Date: 10/11/17  Intake/Output from previous day: 10/29 0701 - 10/30 0700 In: 1740 [I.V.:970; Blood:670; IV Piggyback:100] Out: 500 [Urine:500] Intake/Output this shift: No intake/output data recorded.  PE: Gen:  Alert, NAD, pleasant  Card:  Regular rate and rhythm, pedal pulses 2+ BL Pulm:  Normal effort, clear to auscultation bilaterally Abd: Soft, TTP RUQ and epigastrium without peritonitis, + BS but hypoactive. Skin: warm and dry, no rashes  Psych: A&Ox3    Lab Results:   Recent Labs  10/13/17 1830 10/14/17 0633  WBC 1.5* 1.3*  HGB 8.7* 8.3*  HCT 27.8* 26.4*  PLT 211 175   BMET  Recent Labs  10/13/17 1830 10/14/17 0633  NA 138 138  K 3.4* 3.9  CL 108 111  CO2 18* 20*  GLUCOSE 81 81  BUN 13 10  CREATININE 0.79 0.79  CALCIUM 8.4* 8.2*   PT/INR No results for input(s): LABPROT, INR in the last 72 hours. CMP     Component Value Date/Time   NA 138 10/14/2017 0633   K 3.9 10/14/2017 0633   CL 111 10/14/2017 0633   CO2 20 (L) 10/14/2017 0633   GLUCOSE 81 10/14/2017 0633   GLUCOSE 93 06/11/2010   BUN 10 10/14/2017 0633   CREATININE 0.79 10/14/2017 0633   CREATININE 0.66 11/12/2016 1702   CALCIUM 8.2 (L) 10/14/2017 0633   PROT 5.4 (L) 10/14/2017 0633   ALBUMIN 2.3 (L) 10/14/2017 0633   AST 30 10/14/2017 0633   ALT 13  (L) 10/14/2017 0633   ALKPHOS 165 (H) 10/14/2017 0633   BILITOT 1.2 10/14/2017 0633   GFRNONAA >60 10/14/2017 0633   GFRAA >60 10/14/2017 0633   Lipase     Component Value Date/Time   LIPASE 45 10/12/2017 2341       Studies/Results: Ct Abdomen Pelvis Wo Contrast  Result Date: 10/13/2017 CLINICAL DATA:  Diffuse abdominal pain. Nausea and vomiting. Symptoms for 18 days. Patient refused oral contrast. EXAM: CT ABDOMEN AND PELVIS WITHOUT CONTRAST TECHNIQUE: Multidetector CT imaging of the abdomen and pelvis was performed following the standard protocol without IV contrast. COMPARISON:  10/16/2016 FINDINGS: The study is severely limited by lack of both intravenous and oral contrast. Lower chest: No acute abnormality.  The left ventricle is dilated. Hepatobiliary: Liver is within normal limits. Sludge versus stones layering in the gallbladder. Pericholecystic fluid is suggested. Pancreas: Unremarkable Spleen: Unremarkable Adrenals/Urinary Tract: Adrenal glands and kidneys are unremarkable. Bladder is unremarkable. Stomach/Bowel: Stomach and duodenum are grossly within normal limits. There is no disproportionate dilatation of small bowel. Normal appendix in the right lower quadrant. There is no obvious mass in the colon. Vascular/Lymphatic: No evidence of aortic aneurysm. Several small oval densities are seen to the left of the abdominal aorta. Lymph nodes of indeterminate size cannot be excluded. Reproductive: The uterus is grossly within normal limits. Other: There is no  free fluid. Musculoskeletal: L5-S1 degenerative disc disease with vacuum is noted. No obvious spinal stenosis. IMPRESSION: Sludge versus stones in the gallbladder. Pericholecystic fluid is suspected. Ultrasound can be performed to further delineate. Chronic changes. Exam is limited by lack of both intravenous and oral contrast. Electronically Signed   By: Marybelle Killings M.D.   On: 10/13/2017 09:26   Dg Chest 2 View  Result Date:  10/13/2017 CLINICAL DATA:  Chest pain. EXAM: CHEST  2 VIEW COMPARISON:  Radiograph 07/30/2017 FINDINGS: The cardiomediastinal contours are normal. The lungs are clear. Pulmonary vasculature is normal. No consolidation, pleural effusion, or pneumothorax. No acute osseous abnormalities are seen. IMPRESSION: No active cardiopulmonary disease. Electronically Signed   By: Jeb Levering M.D.   On: 10/13/2017 00:11    Anti-infectives: Anti-infectives    Start     Dose/Rate Route Frequency Ordered Stop   10/13/17 1015  fluconazole (DIFLUCAN) tablet 200 mg     200 mg Oral Weekly 10/13/17 1006         Assessment/Plan HIV - Dr. Baxter Flattery to evaluate  Hepatitis B PMH esophageal stricture/candidiasis  Anemia  GERD - PPI  Chest pain - troponin negative x 4  RUQ and epigastric abdominal pain with weight loss - GI consult  Biliary dyskinesia - EF 9.2% on HIDA scan 07/30/17; possible lap chole this admission pending evaluation by GI and ID. We will follow for further surgical planning. PRN analgesics and antiemetics.    LOS: 0 days    Jill Alexanders , [facility] 10/14/2017, 9:31 AM Pager: [phone] Consults: [phone] Mon-Fri 7:00 am-4:30 pm Sat-Sun 7:00 am-11:30 am

## 2017-10-15 ENCOUNTER — Encounter (HOSPITAL_COMMUNITY)
Admission: EM | Disposition: A | Discharge status: Home / Self Care | Payer: Self-pay | Source: Home / Self Care | Attending: Internal Medicine

## 2017-10-15 ENCOUNTER — Encounter (HOSPITAL_COMMUNITY): Payer: Self-pay | Admitting: Certified Registered Nurse Anesthetist

## 2017-10-15 ENCOUNTER — Inpatient Hospital Stay (HOSPITAL_COMMUNITY): Payer: Medicaid Other | Admitting: Certified Registered Nurse Anesthetist

## 2017-10-15 DIAGNOSIS — Z9114 Patient's other noncompliance with medication regimen: Secondary | ICD-10-CM

## 2017-10-15 HISTORY — PX: CHOLECYSTECTOMY: SHX55

## 2017-10-15 LAB — SURGICAL PCR SCREEN
MRSA, PCR: NEGATIVE
STAPHYLOCOCCUS AUREUS: POSITIVE — AB

## 2017-10-15 LAB — I-STAT BETA HCG BLOOD, ED (NOT ORDERABLE)

## 2017-10-15 SURGERY — LAPAROSCOPIC CHOLECYSTECTOMY WITH INTRAOPERATIVE CHOLANGIOGRAM
Anesthesia: General | Site: Abdomen

## 2017-10-15 MED ORDER — ONDANSETRON HCL 4 MG/2ML IJ SOLN
INTRAMUSCULAR | Status: DC | PRN
  Administered 2017-10-15: 4 mg via INTRAVENOUS

## 2017-10-15 MED ORDER — DEXAMETHASONE SODIUM PHOSPHATE 10 MG/ML IJ SOLN
INTRAMUSCULAR | Status: DC | PRN
  Administered 2017-10-15: 10 mg via INTRAVENOUS

## 2017-10-15 MED ORDER — 0.9 % SODIUM CHLORIDE (POUR BTL) OPTIME
TOPICAL | Status: DC | PRN
  Administered 2017-10-15: 1000 mL

## 2017-10-15 MED ORDER — POTASSIUM CHLORIDE IN NACL 40-0.9 MEQ/L-% IV SOLN
INTRAVENOUS | Status: DC
  Administered 2017-10-15 – 2017-10-16 (×2): 75 mL/h via INTRAVENOUS
  Filled 2017-10-15: qty 1000

## 2017-10-15 MED ORDER — LIDOCAINE 2% (20 MG/ML) 5 ML SYRINGE
INTRAMUSCULAR | Status: DC | PRN
  Administered 2017-10-15: 40 mg via INTRAVENOUS

## 2017-10-15 MED ORDER — DEXAMETHASONE SODIUM PHOSPHATE 10 MG/ML IJ SOLN
INTRAMUSCULAR | Status: AC
  Filled 2017-10-15: qty 1

## 2017-10-15 MED ORDER — PROPOFOL 10 MG/ML IV BOLUS
INTRAVENOUS | Status: DC | PRN
  Administered 2017-10-15: 140 mg via INTRAVENOUS

## 2017-10-15 MED ORDER — DEXTROSE 5 % IV SOLN
2.0000 g | INTRAVENOUS | Status: AC
  Administered 2017-10-15: 2 g via INTRAVENOUS
  Filled 2017-10-15: qty 2

## 2017-10-15 MED ORDER — BUPIVACAINE-EPINEPHRINE (PF) 0.25% -1:200000 IJ SOLN
INTRAMUSCULAR | Status: AC
  Filled 2017-10-15: qty 30

## 2017-10-15 MED ORDER — CHLORHEXIDINE GLUCONATE CLOTH 2 % EX PADS: 6.0000 | MEDICATED_PAD | Freq: Every day | CUTANEOUS | Status: DC

## 2017-10-15 MED ORDER — BUPIVACAINE-EPINEPHRINE 0.25% -1:200000 IJ SOLN
INTRAMUSCULAR | Status: DC | PRN
  Administered 2017-10-15: 20 mL

## 2017-10-15 MED ORDER — LACTATED RINGERS IV SOLN
INTRAVENOUS | Status: DC
  Administered 2017-10-15: 11:00:00 via INTRAVENOUS

## 2017-10-15 MED ORDER — ROCURONIUM BROMIDE 10 MG/ML (PF) SYRINGE
PREFILLED_SYRINGE | INTRAVENOUS | Status: AC
  Filled 2017-10-15: qty 5

## 2017-10-15 MED ORDER — PROMETHAZINE HCL 25 MG/ML IJ SOLN: 6.2500 mg | INTRAMUSCULAR | Status: DC | PRN

## 2017-10-15 MED ORDER — MUPIROCIN 2 % EX OINT
1.0000 "application " | TOPICAL_OINTMENT | Freq: Two times a day (BID) | CUTANEOUS | Status: AC
  Administered 2017-10-15 – 2017-10-20 (×8): 1 via NASAL
  Filled 2017-10-15 (×3): qty 22

## 2017-10-15 MED ORDER — LIDOCAINE 2% (20 MG/ML) 5 ML SYRINGE
INTRAMUSCULAR | Status: AC
  Filled 2017-10-15: qty 5

## 2017-10-15 MED ORDER — FENTANYL CITRATE (PF) 250 MCG/5ML IJ SOLN
INTRAMUSCULAR | Status: AC
  Filled 2017-10-15: qty 5

## 2017-10-15 MED ORDER — SUGAMMADEX SODIUM 200 MG/2ML IV SOLN
INTRAVENOUS | Status: DC | PRN
Start: 2017-10-15 — End: 2017-10-15
  Administered 2017-10-15: 200 mg via INTRAVENOUS

## 2017-10-15 MED ORDER — SODIUM CHLORIDE 0.9 % IR SOLN
Status: DC | PRN
  Administered 2017-10-15: 1

## 2017-10-15 MED ORDER — PROPOFOL 10 MG/ML IV BOLUS
INTRAVENOUS | Status: AC
  Filled 2017-10-15: qty 20

## 2017-10-15 MED ORDER — ONDANSETRON HCL 4 MG/2ML IJ SOLN
INTRAMUSCULAR | Status: AC
  Filled 2017-10-15: qty 2

## 2017-10-15 MED ORDER — SUCCINYLCHOLINE CHLORIDE 20 MG/ML IJ SOLN
INTRAMUSCULAR | Status: DC | PRN
  Administered 2017-10-15: 80 mg via INTRAVENOUS

## 2017-10-15 MED ORDER — DEXTROSE 5 % IV SOLN
1200.0000 mg | INTRAVENOUS | Status: DC
  Administered 2017-10-15 – 2017-10-22 (×2): 1200 mg via INTRAVENOUS
  Filled 2017-10-15 (×2): qty 1200

## 2017-10-15 MED ORDER — CHLORHEXIDINE GLUCONATE CLOTH 2 % EX PADS
6.0000 | MEDICATED_PAD | Freq: Every day | CUTANEOUS | Status: AC
  Administered 2017-10-15 – 2017-10-19 (×4): 6 via TOPICAL

## 2017-10-15 MED ORDER — FENTANYL CITRATE (PF) 100 MCG/2ML IJ SOLN
INTRAMUSCULAR | Status: DC | PRN
  Administered 2017-10-15 (×2): 50 ug via INTRAVENOUS
  Administered 2017-10-15: 25 ug via INTRAVENOUS
  Administered 2017-10-15: 50 ug via INTRAVENOUS

## 2017-10-15 MED ORDER — HYDROMORPHONE HCL 1 MG/ML IJ SOLN
0.2500 mg | INTRAMUSCULAR | Status: DC | PRN
  Administered 2017-10-15: 0.5 mg via INTRAVENOUS

## 2017-10-15 MED ORDER — MORPHINE SULFATE (PF) 2 MG/ML IV SOLN
1.0000 mg | INTRAVENOUS | Status: DC | PRN
  Administered 2017-10-15 – 2017-10-16 (×5): 2 mg via INTRAVENOUS
  Filled 2017-10-15 (×5): qty 1

## 2017-10-15 MED ORDER — MIDAZOLAM HCL 2 MG/2ML IJ SOLN
INTRAMUSCULAR | Status: DC | PRN
  Administered 2017-10-15 (×2): 1 mg via INTRAVENOUS

## 2017-10-15 MED ORDER — HYDROMORPHONE HCL 1 MG/ML IJ SOLN
INTRAMUSCULAR | Status: AC
  Filled 2017-10-15: qty 1

## 2017-10-15 MED ORDER — ROCURONIUM BROMIDE 50 MG/5ML IV SOSY
PREFILLED_SYRINGE | INTRAVENOUS | Status: DC | PRN
  Administered 2017-10-15: 30 mg via INTRAVENOUS

## 2017-10-15 MED ORDER — MIDAZOLAM HCL 2 MG/2ML IJ SOLN
INTRAMUSCULAR | Status: AC
  Filled 2017-10-15: qty 2

## 2017-10-15 SURGICAL SUPPLY — 37 items
ADH SKN CLS APL DERMABOND .7 (GAUZE/BANDAGES/DRESSINGS) ×1
APPLIER CLIP 5 13 M/L LIGAMAX5 (MISCELLANEOUS) ×3
APR CLP MED LRG 5 ANG JAW (MISCELLANEOUS) ×1
BAG SPEC RTRVL LRG 6X4 10 (ENDOMECHANICALS) ×1
BLADE CLIPPER SURG (BLADE) IMPLANT
CANISTER SUCT 3000ML PPV (MISCELLANEOUS) ×3 IMPLANT
CHLORAPREP W/TINT 26ML (MISCELLANEOUS) ×3 IMPLANT
CLIP APPLIE 5 13 M/L LIGAMAX5 (MISCELLANEOUS) ×1 IMPLANT
COVER MAYO STAND STRL (DRAPES) IMPLANT
COVER SURGICAL LIGHT HANDLE (MISCELLANEOUS) ×3 IMPLANT
DERMABOND ADVANCED (GAUZE/BANDAGES/DRESSINGS) ×2
DERMABOND ADVANCED .7 DNX12 (GAUZE/BANDAGES/DRESSINGS) ×1 IMPLANT
DRAPE C-ARM 42X72 X-RAY (DRAPES) IMPLANT
ELECT REM PT RETURN 9FT ADLT (ELECTROSURGICAL) ×3
ELECTRODE REM PT RTRN 9FT ADLT (ELECTROSURGICAL) ×1 IMPLANT
GLOVE SURG SIGNA 7.5 PF LTX (GLOVE) ×3 IMPLANT
GOWN STRL REUS W/ TWL LRG LVL3 (GOWN DISPOSABLE) ×2 IMPLANT
GOWN STRL REUS W/ TWL XL LVL3 (GOWN DISPOSABLE) ×1 IMPLANT
GOWN STRL REUS W/TWL LRG LVL3 (GOWN DISPOSABLE) ×6
GOWN STRL REUS W/TWL XL LVL3 (GOWN DISPOSABLE) ×3
KIT BASIN OR (CUSTOM PROCEDURE TRAY) ×3 IMPLANT
KIT ROOM TURNOVER OR (KITS) ×3 IMPLANT
NS IRRIG 1000ML POUR BTL (IV SOLUTION) ×3 IMPLANT
PAD ARMBOARD 7.5X6 YLW CONV (MISCELLANEOUS) ×3 IMPLANT
POUCH SPECIMEN RETRIEVAL 10MM (ENDOMECHANICALS) ×3 IMPLANT
SCISSORS LAP 5X35 DISP (ENDOMECHANICALS) ×3 IMPLANT
SET CHOLANGIOGRAPH 5 50 .035 (SET/KITS/TRAYS/PACK) IMPLANT
SET IRRIG TUBING LAPAROSCOPIC (IRRIGATION / IRRIGATOR) ×3 IMPLANT
SLEEVE ENDOPATH XCEL 5M (ENDOMECHANICALS) ×6 IMPLANT
SPECIMEN JAR SMALL (MISCELLANEOUS) ×3 IMPLANT
SUT MNCRL AB 4-0 PS2 18 (SUTURE) ×3 IMPLANT
TOWEL OR 17X24 6PK STRL BLUE (TOWEL DISPOSABLE) ×3 IMPLANT
TOWEL OR 17X26 10 PK STRL BLUE (TOWEL DISPOSABLE) ×3 IMPLANT
TRAY LAPAROSCOPIC MC (CUSTOM PROCEDURE TRAY) ×3 IMPLANT
TROCAR XCEL BLUNT TIP 100MML (ENDOMECHANICALS) ×3 IMPLANT
TROCAR XCEL NON-BLD 5MMX100MML (ENDOMECHANICALS) ×3 IMPLANT
TUBING INSUFFLATION (TUBING) ×3 IMPLANT

## 2017-10-15 NOTE — Transfer of Care (Signed)
Immediate Anesthesia Transfer of Care Note  Patient: [name]  Procedure(s) Performed: LAPAROSCOPIC CHOLECYSTECTOMY WITH INTRAOPERATIVE CHOLANGIOGRAM (N/A Abdomen)  Patient Location: PACU  Anesthesia Type:General  Level of Consciousness: awake, alert  and oriented  Airway & Oxygen Therapy: Patient Spontanous Breathing and Patient connected to face mask oxygen  Post-op Assessment: Report given to RN and Post -op Vital signs reviewed and stable  Post vital signs: Reviewed and stable  Last Vitals:  Vitals:   10/14/17 1431 10/14/17 2116  BP: 137/88 (!) 143/90  Pulse: 65 (!) 59  Resp: 20 18  Temp: 36.6 C 36.4 C  SpO2: 100% 100%    Last Pain:  Vitals:   10/15/17 0730  TempSrc:   PainSc: 3          Complications: No apparent anesthesia complications

## 2017-10-15 NOTE — Progress Notes (Signed)
[facility]ious [facility]  Date of Admission:  10/13/2017     Patient ID: [name] with advanced HIV/AIDS (CD4 10, VL 4.5 million) here for abdominal pain in setting of known gallbladder dysfunction (HIDA scan EF 9%).    Active Problems:   Anemia   Nausea & vomiting   Nausea and vomiting   . [MAR Hold] benztropine  1 mg Oral Daily  . [MAR Hold] bictegravir-emtricitabine-tenofovir AF  1 tablet Oral Daily  . [MAR Hold] dapsone  100 mg Oral Daily  . [MAR Hold] feeding supplement  1 Container Oral TID BM  . [MAR Hold] fluconazole  200 mg Oral Weekly  . HYDROmorphone      . [MAR Hold] multivitamin  15 mL Oral Daily  . [MAR Hold] OLANZapine  5 mg Oral QHS  . [MAR Hold] pantoprazole (PROTONIX) IV  40 mg Intravenous Q12H  . [MAR Hold] pneumococcal 23 valent vaccine  0.5 mL Intramuscular Tomorrow-1000  . [MAR Hold] potassium chloride  40 mEq Oral Once  . [MAR Hold] sucralfate  1 g Oral TID WC & HS    SUBJECTIVE: Feeling about the same. Really likes the new HIV pill - tells me it is tiny and goes down much easier.   Review of Systems: Review of Systems  All other systems reviewed and are negative.   Allergies  Allergen Reactions  . Other Hives and Itching    Berries, TREE NUTS  . Peanut-Containing Drug Products Hives  . Shellfish Allergy Hives  . Bactrim Itching  . Orange Fruit Itching  . Sulfa Antibiotics Itching    OBJECTIVE: Vitals:   10/15/17 1245 10/15/17 1300 10/15/17 1315 10/15/17 1330  BP: (!) 156/95 (!) 161/95 (!) 161/95 (!) 154/90  Pulse: 93 88 89 85  Resp: 15 15 13 12   Temp:      TempSrc:      SpO2: 100% 100% 100% 100%  Weight:      Height:       Body mass index is 17.09 kg/m.  Physical Exam  Constitutional: She is oriented to person, place, and time and well-developed, well-nourished, and in no distress.  HENT:  Mouth/Throat: Oropharynx is clear and moist. No oral lesions. No dental abscesses.  Cardiovascular: Normal rate,  regular rhythm and normal heart sounds.   Pulmonary/Chest: Breath sounds normal. No respiratory distress.  Abdominal: Soft. Bowel sounds are normal. She exhibits no distension. There is tenderness.  Musculoskeletal: Normal range of motion. She exhibits no tenderness.  Lymphadenopathy:    She has no cervical adenopathy.  Neurological: She is alert and oriented to person, place, and time.  Skin: Skin is warm and dry. No rash noted.  Psychiatric: Mood, affect and judgment normal.    Lab Results Lab Results  Component Value Date   WBC 1.3 (LL) 10/14/2017   HGB 8.3 (L) 10/14/2017   HCT 26.4 (L) 10/14/2017   MCV 75.0 (L) 10/14/2017   PLT 175 10/14/2017    Lab Results  Component Value Date   CREATININE 0.79 10/14/2017   BUN 10 10/14/2017   NA 138 10/14/2017   K 3.9 10/14/2017   CL 111 10/14/2017   CO2 20 (L) 10/14/2017    Lab Results  Component Value Date   ALT 13 (L) 10/14/2017   AST 30 10/14/2017   ALKPHOS 165 (H) 10/14/2017   BILITOT 1.2 10/14/2017     Microbiology: Recent Results (from the past 240 hour(s))  Surgical pcr screen  Status: Abnormal   Collection Time: 10/15/17 10:14 AM  Result Value Ref Range Status   MRSA, PCR NEGATIVE NEGATIVE Final   Staphylococcus aureus POSITIVE (A) NEGATIVE Final    Comment: (NOTE) The Xpert SA Assay (FDA approved for NASAL specimens in patients 44 years of age and older), is one component of a comprehensive surveillance program. It is not intended to diagnose infection nor to guide or monitor treatment.      ASSESSMENT: [name] with advanced HIV/AIDS (VL > 4.5 million) with gallbladder dysfunction (HIDA 9.2% EF 07/2017) and anemia.  PLAN: 1. Continue Biktarvy (~2 - 4 hours after [name] and MVIs) 2. Lap chole later today  3. Azithromycin for MAC proph to be given IV for now (1200 mg weekly) 4. Continue Dapsone daily  5. Will check Hep B status    Janene Madeira, MSN, NP-C [facility] for Infectious  Disease [facility] Cell: [phone] Pager: [phone]  10/15/2017  1:39 PM

## 2017-10-15 NOTE — Progress Notes (Signed)
Patient back from OR. Alert and oriented, complaining of pain on surgical sites (6/10). 4 Ports Abdomen - liquid skin adhesive - intact, no drainage. Will continue to monitor.

## 2017-10-15 NOTE — Op Note (Signed)

## 2017-10-15 NOTE — Progress Notes (Signed)
Patient ID: [name], female   DOB: 1976-08-05, 41 y.o.   MRN: [id number]  Given significant bilary colic, will proceed with lap chole today. I discussed the procedure in detail.   We discussed the risks and benefits of a laparoscopic cholecystectomy and possible cholangiogram including, but not limited to bleeding, infection, injury to surrounding structures such as the intestine or liver, bile leak, retained gallstones, need to convert to an open procedure, prolonged diarrhea, blood clots such as  DVT, common bile duct injury, anesthesia risks, and possible need for additional procedures.   She agrees to proceed with surgery

## 2017-10-15 NOTE — Progress Notes (Signed)
Patient going to OR for Laparoscopic Cholecystectomy. Alert and oriented; short stay was called and report was given to the nurse who is going to receive the patient.

## 2017-10-15 NOTE — Progress Notes (Signed)
PROGRESS NOTE    [name]  UDJ:[id number] DOB: 1976-01-25 DOA: 10/13/2017 PCP: Nolene Ebbs, MD   Outpatient Specialists:     Brief Narrative:  [name]  is a 41 y.o. female, w HIV, Hepatitis B, Gerd, Esophageal stenosis , Esophageal candidiasis apparently presents w chest pain.  "sharp pain"  Started 3 weeks ago.  Fairly constant.  + nausea and some vomitting  Starting 3 weeks ago.  Some diarrhea starting yesterday.  "loose stool"  Had 2 loose stool yesterday.  Slight epigastric discomfort as well over the past 3 week,  Denies fever, chills, constipation, brbpr, black stool.   Pt has not been taking her pantoprazole for the past 1 week.    Assessment & Plan:   Active Problems:   Anemia   Nausea & vomiting   Nausea and vomiting  N/v/ abdominal pain zofran CT scan abd/ pelvis: Sludge versus stones in the gallbladder. Pericholecystic fluid is suspected.  Start protonix 40mg  iv bid Hydrate with ns iv Discussed with Dr. Tami Lin of [facility]-- no need to see currently, would continue with plan of cholecystectomy Last hospitalization: HIDA showed 9.2% -s/p cholecystectomy 10/31  Anemia- ? Chronic disease Stable S/p 1 unit  Hiv/Hepatitis B ID resumed meds -patient has not been taking medications for last 1 month as she is moving--? Story appears to change frequently as first, she could not tolerate meds  Gerd Protonix IV  H/o esophageal candidiasis Patient states she has been taking weekly fluconazole  bipolar -resume home meds  DVT prophylaxis:  SCD's  Code Status: Full Code   Family Communication:   Disposition Plan:     Consultants:   ID  GI  (phone)  surgery  Procedures:   Cholecystectomy    Subjective: Some RUQ pain, minimal nausea but just taking in water  Objective: Vitals:   10/15/17 1330 10/15/17 1340 10/15/17 1345 10/15/17 1436  BP: (!) 154/90 (!) 165/92 (!) 165/92 (!) 164/90  Pulse: 85 81 80 71  Resp: 12 13 13 20     Temp:   97.7 F (36.5 C) (!) 97.5 F (36.4 C)  TempSrc:    Oral  SpO2: 100% 100% 100% 100%  Weight:      Height:        Intake/Output Summary (Last 24 hours) at 10/15/17 1600 Last data filed at 10/15/17 1453  Gross per 24 hour  Intake              880 ml  Output               10 ml  Net              870 ml   Filed Weights   10/14/17 0533 10/15/17 0500 10/15/17 1054  Weight: 52.8 kg (116 lb 4.8 oz) 57.2 kg (126 lb) 57.2 kg (126 lb)    Examination:  General exam: poor eye contact Respiratory system: clear Cardiovascular system: rrr Gastrointestinal system: tender Central nervous system: alert, NAD Extremities: moves al 4 ext     Data Reviewed: I have personally reviewed following labs and imaging studies  CBC:  Recent Labs Lab 10/12/17 2341 10/13/17 1830 10/14/17 0633  WBC 2.1* 1.5* 1.3*  HGB 6.8* 8.7* 8.3*  HCT 22.7* 27.8* 26.4*  MCV 69.4* 75.5* 75.0*  PLT 256 211 588   Basic Metabolic Panel:  Recent Labs Lab 10/12/17 2341 10/13/17 0159 10/13/17 1830 10/14/17 0633  NA 135  --  138 138  K 2.6*  --  3.4* 3.9  CL 99*  --  108 111  CO2 23  --  18* 20*  GLUCOSE 96  --  81 81  BUN 15  --  13 10  CREATININE 0.93  --  0.79 0.79  CALCIUM 9.3  --  8.4* 8.2*  MG  --  1.9  --   --    GFR: Estimated Creatinine Clearance: 84.4 mL/min (by C-G formula based on SCr of 0.79 mg/dL). Liver Function Tests:  Recent Labs Lab 10/12/17 2341 10/14/17 0633  AST 34 30  ALT 14 13*  ALKPHOS 221* 165*  BILITOT 1.2 1.2  PROT 7.4 5.4*  ALBUMIN 3.2* 2.3*    Recent Labs Lab 10/12/17 2341  LIPASE 45   No results for input(s): AMMONIA in the last 168 hours. Coagulation Profile: No results for input(s): INR, PROTIME in the last 168 hours. Cardiac Enzymes:  Recent Labs Lab 10/13/17 0728 10/13/17 1830 10/14/17 0052 10/14/17 0633  TROPONINI <0.03 <0.03 <0.03 <0.03   BNP (last 3 results) No results for input(s): PROBNP in the last 8760 hours. HbA1C: No  results for input(s): HGBA1C in the last 72 hours. CBG: No results for input(s): GLUCAP in the last 168 hours. Lipid Profile: No results for input(s): CHOL, HDL, LDLCALC, TRIG, CHOLHDL, LDLDIRECT in the last 72 hours. Thyroid Function Tests: No results for input(s): TSH, T4TOTAL, FREET4, T3FREE, THYROIDAB in the last 72 hours. Anemia Panel: No results for input(s): VITAMINB12, FOLATE, FERRITIN, TIBC, IRON, RETICCTPCT in the last 72 hours. Urine analysis:    Component Value Date/Time   COLORURINE YELLOW 07/29/2017 1357   APPEARANCEUR HAZY (A) 07/29/2017 1357   LABSPEC 1.009 07/29/2017 1357   PHURINE 6.0 07/29/2017 1357   GLUCOSEU NEGATIVE 07/29/2017 1357   HGBUR LARGE (A) 07/29/2017 1357   BILIRUBINUR NEGATIVE 07/29/2017 1357   KETONESUR 5 (A) 07/29/2017 1357   PROTEINUR 100 (A) 07/29/2017 1357   UROBILINOGEN 0.2 12/07/2014 1441   NITRITE NEGATIVE 07/29/2017 1357   LEUKOCYTESUR LARGE (A) 07/29/2017 1357     ) Recent Results (from the past 240 hour(s))  Surgical pcr screen     Status: Abnormal   Collection Time: 10/15/17 10:14 AM  Result Value Ref Range Status   MRSA, PCR NEGATIVE NEGATIVE Final   Staphylococcus aureus POSITIVE (A) NEGATIVE Final    Comment: (NOTE) The Xpert SA Assay (FDA approved for NASAL specimens in patients 35 years of age and older), is one component of a comprehensive surveillance program. It is not intended to diagnose infection nor to guide or monitor treatment.       Anti-infectives    Start     Dose/Rate Route Frequency Ordered Stop   10/15/17 1000  cefTRIAXone (ROCEPHIN) 2 g in dextrose 5 % 50 mL IVPB    Comments:  Pharmacy may adjust dosing strength / duration / interval for maximal efficacy   2 g 100 mL/hr over 30 Minutes Intravenous To Surgery 10/15/17 0908 10/15/17 1200   10/14/17 1900  bictegravir-emtricitabine-tenofovir AF (BIKTARVY) 50-200-25 MG per tablet 1 tablet     1 tablet Oral Daily 10/14/17 1633     10/14/17 1700  dapsone  tablet 100 mg     100 mg Oral Daily 10/14/17 1633     10/13/17 1015  fluconazole (DIFLUCAN) tablet 200 mg     200 mg Oral Weekly 10/13/17 1006         Radiology Studies: No results found.      Scheduled Meds: . benztropine  1 mg  Oral Daily  . bictegravir-emtricitabine-tenofovir AF  1 tablet Oral Daily  . dapsone  100 mg Oral Daily  . feeding supplement  1 Container Oral TID BM  . fluconazole  200 mg Oral Weekly  . HYDROmorphone      . multivitamin  15 mL Oral Daily  . OLANZapine  5 mg Oral QHS  . pantoprazole (PROTONIX) IV  40 mg Intravenous Q12H  . pneumococcal 23 valent vaccine  0.5 mL Intramuscular Tomorrow-1000  . sucralfate  1 g Oral TID WC & HS   Continuous Infusions: . 0.9 % NaCl with KCl 40 mEq / L 75 mL/hr (10/15/17 1453)     LOS: 1 day    Time spent: 25 min    Evangeline, DO [facility] Pager [phone]  If 7PM-7AM, please contact night-coverage [email or web address] Password [email or web address] 10/15/2017, 4:00 PM

## 2017-10-15 NOTE — Anesthesia Procedure Notes (Signed)
Procedure Name: Intubation Date/Time: 10/15/2017 11:44 AM Performed by: Lieutenant Diego Pre-anesthesia Checklist: Patient identified, Emergency Drugs available, Suction available and Patient being monitored Patient Re-evaluated:Patient Re-evaluated prior to induction Oxygen Delivery Method: Circle system utilized Preoxygenation: Pre-oxygenation with 100% oxygen Induction Type: IV induction Ventilation: Mask ventilation without difficulty Laryngoscope Size: Miller and 2 Grade View: Grade I Tube type: Oral Tube size: 7.0 mm Number of attempts: 1 Airway Equipment and Method: Stylet and Oral airway Placement Confirmation: ETT inserted through vocal cords under direct vision,  positive ETCO2 and breath sounds checked- equal and bilateral Secured at: 21 cm Tube secured with: Tape Dental Injury: Teeth and Oropharynx as per pre-operative assessment

## 2017-10-15 NOTE — Anesthesia Preprocedure Evaluation (Signed)
Anesthesia Evaluation  Patient identified by MRN, date of birth, ID band Patient awake    Reviewed: Allergy & Precautions, NPO status , Patient's Chart, lab work & pertinent test results  History of Anesthesia Complications Negative for: history of anesthetic complications  Airway Mallampati: I  TM Distance: >3 FB Neck ROM: full    Dental  (+) Teeth Intact   Pulmonary asthma ,    breath sounds clear to auscultation       Cardiovascular negative cardio ROS   Rhythm:regular Rate:Normal     Neuro/Psych PSYCHIATRIC DISORDERS Anxiety Depression Bipolar Disorder Schizophrenia negative neurological ROS     GI/Hepatic PUD, GERD  ,(+) Hepatitis -, B  Endo/Other    Renal/GU      Musculoskeletal   Abdominal   Peds  Hematology  (+) HIV,   Anesthesia Other Findings   Reproductive/Obstetrics                             Lab Results  Component Value Date   WBC 1.3 (LL) 10/14/2017   HGB 8.3 (L) 10/14/2017   HCT 26.4 (L) 10/14/2017   MCV 75.0 (L) 10/14/2017   PLT 175 10/14/2017   Lab Results  Component Value Date   CREATININE 0.79 10/14/2017   BUN 10 10/14/2017   NA 138 10/14/2017   K 3.9 10/14/2017   CL 111 10/14/2017   CO2 20 (L) 10/14/2017    Anesthesia Physical  Anesthesia Plan  ASA: II  Anesthesia Plan: General   Post-op Pain Management:    Induction: Intravenous  PONV Risk Score and Plan: 4 or greater and Ondansetron, Dexamethasone, Scopolamine patch - Pre-op and Treatment may vary due to age or medical condition  Airway Management Planned: Simple Face Mask  Additional Equipment: None  Intra-op Plan:   Post-operative Plan: Extubation in OR  Informed Consent: I have reviewed the patients History and Physical, chart, labs and discussed the procedure including the risks, benefits and alternatives for the proposed anesthesia with the patient or authorized representative who  has indicated his/her understanding and acceptance.   Dental advisory given  Plan Discussed with: Anesthesiologist and Surgeon  Anesthesia Plan Comments:         Anesthesia Quick Evaluation

## 2017-10-15 NOTE — Discharge Instructions (Signed)
[facility] ______[facility] °LAPAROSCOPIC SURGERY: POST OP INSTRUCTIONS °Always review your discharge instruction sheet given to you by the facility where your surgery was performed. °IF YOU HAVE DISABILITY OR FAMILY LEAVE FORMS, YOU MUST BRING THEM TO THE OFFICE FOR PROCESSING.   °DO NOT GIVE THEM TO YOUR DOCTOR. ° °1. A prescription for pain medication may be given to you upon discharge.  Take your pain medication as prescribed, if needed.  If narcotic pain medicine is not needed, then you may take acetaminophen (Tylenol) or ibuprofen (Advil) as needed. °2. Take your usually prescribed medications unless otherwise directed. °3. If you need a refill on your pain medication, please contact your pharmacy.  They will contact our office to request authorization. Prescriptions will not be filled after 5pm or on week-ends. °4. You should follow a light diet the first few days after arrival home, such as soup and crackers, etc.  Be sure to include lots of fluids daily. °5. Most patients will experience some swelling and bruising in the area of the incisions.  Ice packs will help.  Swelling and bruising can take several days to resolve.  °6. It is common to experience some constipation if taking pain medication after surgery.  Increasing fluid intake and taking a stool softener (such as Colace) will usually help or prevent this problem from occurring.  A mild laxative (Milk of Magnesia or Miralax) should be taken according to package instructions if there are no bowel movements after 48 hours. °7. Unless discharge instructions indicate otherwise, you may remove your bandages 24-48 hours after surgery, and you may shower at that time.  You may have steri-strips (small skin tapes) in place directly over the incision.  These strips should be left on the skin for 7-10 days.  If your surgeon used skin glue on the incision, you may shower in 24 hours.  The glue will flake off over the next 2-3 weeks.  Any sutures or  staples will be removed at the office during your follow-up visit. °8. ACTIVITIES:  You may resume regular (light) daily activities beginning the next day--such as daily self-care, walking, climbing stairs--gradually increasing activities as tolerated.  You may have sexual intercourse when it is comfortable.  Refrain from any heavy lifting or straining until approved by your doctor. °a. You may drive when you are no longer taking prescription pain medication, you can comfortably wear a seatbelt, and you can safely maneuver your car and apply brakes. °b. RETURN TO WORK:  __________________________________________________________ °9. You should see your doctor in the office for a follow-up appointment approximately 2-3 weeks after your surgery.  Make sure that you call for this appointment within a day or two after you arrive home to insure a convenient appointment time. °10. OTHER INSTRUCTIONS: __________________________________________________________________________________________________________________________ __________________________________________________________________________________________________________________________ °WHEN TO CALL YOUR DOCTOR: °1. Fever over 101.0 °2. Inability to urinate °3. Continued bleeding from incision. °4. Increased pain, redness, or drainage from the incision. °5. Increasing abdominal pain ° °The clinic staff is available to answer your questions during regular business hours.  Please don’t hesitate to call and ask to speak to one of the nurses for clinical concerns.  If you have a medical emergency, go to the nearest emergency room or call 911.  A surgeon from [facility] is always on call at the hospital. °[address], [address], [place], [place]  [address] ? [address] [address], [place], [place]   [address] °[phone] ? [phone] ? FAX [phone] °Web site:   [email or web address] °

## 2017-10-15 NOTE — Progress Notes (Signed)
  Appreciate ID evaluation and management of poorly controlled HIV disease.  We will proceed with laparoscopic cholecystectomy today by Dr. 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  Patient has been on clear liquids, made NPO at 0900.  Consent ordered. IV abx on call to OR.   Obie Dredge, PA-C [facility] Pager: [phone] Consults: [phone] Mon-Fri 7:00 am-4:30 pm Sat-Sun 7:00 am-11:30 am

## 2017-10-16 ENCOUNTER — Encounter (HOSPITAL_COMMUNITY): Payer: Self-pay | Admitting: Internal Medicine

## 2017-10-16 DIAGNOSIS — K829 Disease of gallbladder, unspecified: Secondary | ICD-10-CM

## 2017-10-16 DIAGNOSIS — K828 Other specified diseases of gallbladder: Secondary | ICD-10-CM

## 2017-10-16 LAB — CBC
HEMATOCRIT: 28.4 % — AB (ref 36.0–46.0)
HEMOGLOBIN: 9.2 g/dL — AB (ref 12.0–15.0)
MCH: 23.9 pg — ABNORMAL LOW (ref 26.0–34.0)
MCHC: 32.4 g/dL (ref 30.0–36.0)
MCV: 73.8 fL — ABNORMAL LOW (ref 78.0–100.0)
Platelets: 160 10*3/uL (ref 150–400)
RBC: 3.85 MIL/uL — ABNORMAL LOW (ref 3.87–5.11)
RDW: 21.8 % — ABNORMAL HIGH (ref 11.5–15.5)
WBC: 1.7 10*3/uL — AB (ref 4.0–10.5)

## 2017-10-16 LAB — BASIC METABOLIC PANEL
ANION GAP: 10 (ref 5–15)
BUN: 8 mg/dL (ref 6–20)
CHLORIDE: 108 mmol/L (ref 101–111)
CO2: 17 mmol/L — AB (ref 22–32)
Calcium: 8.3 mg/dL — ABNORMAL LOW (ref 8.9–10.3)
Creatinine, Ser: 0.97 mg/dL (ref 0.44–1.00)
GFR calc non Af Amer: >60 mL/min (ref >60)
Glucose, Bld: 133 mg/dL — ABNORMAL HIGH (ref 65–99)
POTASSIUM: 5 mmol/L (ref 3.5–5.1)
Sodium: 135 mmol/L (ref 135–145)

## 2017-10-16 MED ORDER — ALUM & MAG HYDROXIDE-SIMETH 200-200-20 MG/5ML PO SUSP
15.0000 mL | Freq: Four times a day (QID) | ORAL | Status: DC | PRN
  Administered 2017-10-16: 15 mL via ORAL
  Filled 2017-10-16 (×3): qty 30

## 2017-10-16 NOTE — Progress Notes (Signed)
PROGRESS NOTE    [name]  PPI:[id number] DOB: 1976-03-19 DOA: 10/13/2017 PCP: Nolene Ebbs, MD   Outpatient Specialists:     Brief Narrative:  [name]  is a 41 y.o. female, w HIV, Hepatitis B, Gerd, Esophageal stenosis , Esophageal candidiasis apparently presents w chest pain.  "sharp pain"  Started 3 weeks ago.  Fairly constant.  + nausea and some vomitting  Starting 3 weeks ago.  Some diarrhea starting yesterday.  "loose stool"  Had 2 loose stool yesterday.  Slight epigastric discomfort as well over the past 3 week.  During prior hospitalization, patient was found to have biliary dyskinesia.  Seen by GS and cholecystectomy done on 10/31.     Assessment & Plan:   Active Problems:   Anemia   Nausea & vomiting   Nausea and vomiting  N/V/abd pain due to biliary dyskinesia zofran CT scan abd/ pelvis: Sludge versus stones in the gallbladder. Pericholecystic fluid issuspected.  protonix 40mg  iv bid -d/c IVF Discussed with Dr. Tami Lin of [facility]-- no need to see currently, would continue with plan of cholecystectomy Last hospitalization: HIDA showed 9.2% -s/p cholecystectomy 10/31 -ambulate patient  H/o esophageal candidiasis -on weekly fluconazole -outpatient GI follow up (schooler)  Anemia- ? Chronic disease Stable S/p 1 unit -follow CBC  Hiv/Hepatitis B ID resumed meds -patient has not been taking medications for last 1 month as she is moving--? Story appears to change frequently as first, she could not tolerate meds  Gerd Protonix IV  bipolar -resume home meds  DVT prophylaxis:  SCD's  Code Status: Full Code   Family Communication:   Disposition Plan:  Home once tolerating PO   Consultants:   ID  GI  (phone)  surgery  Procedures:   Cholecystectomy    Subjective: Tolerating clears, does have some abdominal pain + flatus  Objective: Vitals:   10/15/17 1345 10/15/17 1436 10/15/17 2151 10/16/17 0531  BP: (!) 165/92 (!)  164/90 (!) 164/94 130/87  Pulse: 80 71 77 63  Resp: 13 20 18 18   Temp: 97.7 F (36.5 C) (!) 97.5 F (36.4 C) 97.8 F (36.6 C) 97.7 F (36.5 C)  TempSrc:  Oral Oral Oral  SpO2: 100% 100% 100% 100%  Weight:      Height:        Intake/Output Summary (Last 24 hours) at 10/16/17 1201 Last data filed at 10/15/17 1835  Gross per 24 hour  Intake           1377.5 ml  Output               10 ml  Net           1367.5 ml   Filed Weights   10/14/17 0533 10/15/17 0500 10/15/17 1054  Weight: 52.8 kg (116 lb 4.8 oz) 57.2 kg (126 lb) 57.2 kg (126 lb)    Examination:  General exam: flat affect, on phone Respiratory system: clear Cardiovascular system: rrr Gastrointestinal system: +BS tender Central nervous system: alert Extremities: moves all 4 ext     Data Reviewed: I have personally reviewed following labs and imaging studies  CBC:  Recent Labs Lab 10/12/17 2341 10/13/17 1830 10/14/17 0633 10/16/17 0634  WBC 2.1* 1.5* 1.3* 1.7*  HGB 6.8* 8.7* 8.3* 9.2*  HCT 22.7* 27.8* 26.4* 28.4*  MCV 69.4* 75.5* 75.0* 73.8*  PLT 256 211 175 063   Basic Metabolic Panel:  Recent Labs Lab 10/12/17 2341 10/13/17 0159 10/13/17 1830 10/14/17 0160 10/16/17 0634  NA  135  --  138 138 135  K 2.6*  --  3.4* 3.9 5.0  CL 99*  --  108 111 108  CO2 23  --  18* 20* 17*  GLUCOSE 96  --  81 81 133*  BUN 15  --  13 10 8   CREATININE 0.93  --  0.79 0.79 0.97  CALCIUM 9.3  --  8.4* 8.2* 8.3*  MG  --  1.9  --   --   --    GFR: Estimated Creatinine Clearance: 69.6 mL/min (by C-G formula based on SCr of 0.97 mg/dL). Liver Function Tests:  Recent Labs Lab 10/12/17 2341 10/14/17 0633  AST 34 30  ALT 14 13*  ALKPHOS 221* 165*  BILITOT 1.2 1.2  PROT 7.4 5.4*  ALBUMIN 3.2* 2.3*    Recent Labs Lab 10/12/17 2341  LIPASE 45   No results for input(s): AMMONIA in the last 168 hours. Coagulation Profile: No results for input(s): INR, PROTIME in the last 168 hours. Cardiac  Enzymes:  Recent Labs Lab 10/13/17 0728 10/13/17 1830 10/14/17 0052 10/14/17 0633  TROPONINI <0.03 <0.03 <0.03 <0.03   BNP (last 3 results) No results for input(s): PROBNP in the last 8760 hours. HbA1C: No results for input(s): HGBA1C in the last 72 hours. CBG: No results for input(s): GLUCAP in the last 168 hours. Lipid Profile: No results for input(s): CHOL, HDL, LDLCALC, TRIG, CHOLHDL, LDLDIRECT in the last 72 hours. Thyroid Function Tests: No results for input(s): TSH, T4TOTAL, FREET4, T3FREE, THYROIDAB in the last 72 hours. Anemia Panel: No results for input(s): VITAMINB12, FOLATE, FERRITIN, TIBC, IRON, RETICCTPCT in the last 72 hours. Urine analysis:    Component Value Date/Time   COLORURINE YELLOW 07/29/2017 1357   APPEARANCEUR HAZY (A) 07/29/2017 1357   LABSPEC 1.009 07/29/2017 1357   PHURINE 6.0 07/29/2017 1357   GLUCOSEU NEGATIVE 07/29/2017 1357   HGBUR LARGE (A) 07/29/2017 1357   BILIRUBINUR NEGATIVE 07/29/2017 1357   KETONESUR 5 (A) 07/29/2017 1357   PROTEINUR 100 (A) 07/29/2017 1357   UROBILINOGEN 0.2 12/07/2014 1441   NITRITE NEGATIVE 07/29/2017 1357   LEUKOCYTESUR LARGE (A) 07/29/2017 1357     ) Recent Results (from the past 240 hour(s))  Surgical pcr screen     Status: Abnormal   Collection Time: 10/15/17 10:14 AM  Result Value Ref Range Status   MRSA, PCR NEGATIVE NEGATIVE Final   Staphylococcus aureus POSITIVE (A) NEGATIVE Final    Comment: (NOTE) The Xpert SA Assay (FDA approved for NASAL specimens in patients 17 years of age and older), is one component of a comprehensive surveillance program. It is not intended to diagnose infection nor to guide or monitor treatment.       Anti-infectives    Start     Dose/Rate Route Frequency Ordered Stop   10/15/17 1800  azithromycin (ZITHROMAX) 1,200 mg in dextrose 5 % 250 mL IVPB     1,200 mg 250 mL/hr over 60 Minutes Intravenous Weekly 10/15/17 1655     10/15/17 1000  cefTRIAXone (ROCEPHIN) 2 g in  dextrose 5 % 50 mL IVPB    Comments:  Pharmacy may adjust dosing strength / duration / interval for maximal efficacy   2 g 100 mL/hr over 30 Minutes Intravenous To Surgery 10/15/17 0908 10/15/17 1200   10/14/17 1900  bictegravir-emtricitabine-tenofovir AF (BIKTARVY) 50-200-25 MG per tablet 1 tablet     1 tablet Oral Daily 10/14/17 1633     10/14/17 1700  dapsone tablet 100 mg  100 mg Oral Daily 10/14/17 1633     10/13/17 1015  fluconazole (DIFLUCAN) tablet 200 mg     200 mg Oral Weekly 10/13/17 1006         Radiology Studies: No results found.      Scheduled Meds: . benztropine  1 mg Oral Daily  . bictegravir-emtricitabine-tenofovir AF  1 tablet Oral Daily  . Chlorhexidine Gluconate Cloth  6 each Topical Daily  . dapsone  100 mg Oral Daily  . feeding supplement  1 Container Oral TID BM  . fluconazole  200 mg Oral Weekly  . multivitamin  15 mL Oral Daily  . mupirocin ointment  1 application Nasal BID  . OLANZapine  5 mg Oral QHS  . pantoprazole (PROTONIX) IV  40 mg Intravenous Q12H  . pneumococcal 23 valent vaccine  0.5 mL Intramuscular Tomorrow-1000  . sucralfate  1 g Oral TID WC & HS   Continuous Infusions: . 0.9 % NaCl with KCl 40 mEq / L 50 mL/hr (10/16/17 1057)  . azithromycin Stopped (10/15/17 1906)     LOS: 2 days    Time spent: 25 min    [place], DO [facility] Pager [phone]  If 7PM-7AM, please contact night-coverage [email or web address] Password [email or web address] 10/16/2017, 12:01 PM

## 2017-10-16 NOTE — Anesthesia Postprocedure Evaluation (Signed)
Anesthesia Post Note  Patient: [name]  Procedure(s) Performed: LAPAROSCOPIC CHOLECYSTECTOMY WITH INTRAOPERATIVE CHOLANGIOGRAM (N/A Abdomen)     Patient location during evaluation: PACU Anesthesia Type: General Level of consciousness: awake and alert Pain management: pain level controlled Vital Signs Assessment: post-procedure vital signs reviewed and stable Respiratory status: spontaneous breathing, nonlabored ventilation, respiratory function stable and patient connected to nasal cannula oxygen Cardiovascular status: blood pressure returned to baseline and stable Postop Assessment: no apparent nausea or vomiting Anesthetic complications: no    Last Vitals:  Vitals:   10/15/17 2151 10/16/17 0531  BP: (!) 164/94 130/87  Pulse: 77 63  Resp: 18 18  Temp: 36.6 C 36.5 C  SpO2: 100% 100%    Last Pain:  Vitals:   10/16/17 0902  TempSrc:   PainSc: 6                  Tiajuana Amass

## 2017-10-16 NOTE — Progress Notes (Addendum)
Nutrition Follow-up  DOCUMENTATION CODES:   Severe malnutrition in context of chronic illness, Underweight  INTERVENTION:  1.Continue  Boost Breeze po TID, each supplement provides 250 kcal and 9 grams of protein 2. MVI w/ Minerals 3. Monitor and encourage PO intake, if unable to meet 50% estimated needs PO recommend Cortrak placement post-pyloric if ok per surgery, GI, IM  NUTRITION DIAGNOSIS:   Severe Malnutrition related to chronic illness as evidenced by energy intake < 75% for > or equal to 1 month, percent weight loss. 12% severe -ongoing  GOAL:   Patient will meet greater than or equal to 90% of their needs -progressing  MONITOR:   PO intake, I & O's, Labs, Supplement acceptance, Weight trends  ASSESSMENT:   [name] has PMH of HIV, Hepatitis B, Esophageal stenosis, candidiasis,presents with  Chest pain, nausea & vomiting x3 weeks, diarrhea yesterday  S/P lap chole Still having significant pain with PO intake Was attempting to drink some liquids this morning. Had orange boost breeze which was only flavor stocked on floor. Patient says she is allergic to orange, causes itching. Called pharmacy, should be sending other flavors up. Encouraged PO intake.  Labs reviewed  Medications reviewed and include:  Biktarvy  Diet Order:  DIET SOFT Room service appropriate? Yes; Fluid consistency: Thin Diet NPO time specified Except for: Sips with Meds  EDUCATION NEEDS:   Education needs have been addressed  Skin:  Skin Assessment: Reviewed RN Assessment  Last BM:  10/12/2017  Height:   Ht Readings from Last 1 Encounters:  10/15/17 6' (1.829 m)    Weight:   Wt Readings from Last 1 Encounters:  10/22/17 117 lb 14.4 oz (53.5 kg)    Ideal Body Weight:  72.72 kg  BMI:  Body mass index is 15.99 kg/m.  Estimated Nutritional Needs:   Kcal:  1800-2100 calories (35-40 cal/kg)  Protein:  98-109 grams (1.9-2.1g/kg)  Fluid:  1.8-2.1L    [name]. Madison Oyola, MS,  RD LDN Inpatient Clinical Dietitian Pager [phone]

## 2017-10-16 NOTE — Progress Notes (Signed)
[facility]  Date of Admission:  10/13/2017     Patient ID: [name] with advanced HIV/AIDS (CD4 10, VL 4.5 million) here for abdominal pain in setting of known gallbladder dysfunction (HIDA scan EF 9%).    Active Problems:   Anemia   Nausea & vomiting   Nausea and vomiting   . benztropine  1 mg Oral Daily  . bictegravir-emtricitabine-tenofovir AF  1 tablet Oral Daily  . Chlorhexidine Gluconate Cloth  6 each Topical Daily  . dapsone  100 mg Oral Daily  . feeding supplement  1 Container Oral TID BM  . fluconazole  200 mg Oral Weekly  . multivitamin  15 mL Oral Daily  . mupirocin ointment  1 application Nasal BID  . OLANZapine  5 mg Oral QHS  . pantoprazole (PROTONIX) IV  40 mg Intravenous Q12H  . pneumococcal 23 valent vaccine  0.5 mL Intramuscular Tomorrow-1000  . sucralfate  1 g Oral TID WC & HS    SUBJECTIVE: Feeling a little worse today. Has not walked and had pain trying to eat grits today. Better controlled with Norco tabs.   Review of Systems: Review of Systems  All other systems reviewed and are negative.   Allergies  Allergen Reactions  . Other Hives and Itching    Berries, TREE NUTS  . Peanut-Containing Drug Products Hives  . Shellfish Allergy Hives  . Bactrim Itching  . Orange Fruit Itching  . Sulfa Antibiotics Itching    OBJECTIVE: Vitals:   10/15/17 1345 10/15/17 1436 10/15/17 2151 10/16/17 0531  BP: (!) 165/92 (!) 164/90 (!) 164/94 130/87  Pulse: 80 71 77 63  Resp: 13 20 18 18   Temp: 97.7 F (36.5 C) (!) 97.5 F (36.4 C) 97.8 F (36.6 C) 97.7 F (36.5 C)  TempSrc:  Oral Oral Oral  SpO2: 100% 100% 100% 100%  Weight:      Height:       Body mass index is 17.09 kg/m.  Physical Exam  Constitutional: She is oriented to person, place, and time and well-developed, well-nourished, and in no distress.  HENT:  Mouth/Throat: Oropharynx is clear and moist. No oral lesions. No dental abscesses.  Cardiovascular:  Normal rate, regular rhythm and normal heart sounds.   Pulmonary/Chest: Breath sounds normal. No respiratory distress.  Abdominal: Soft. Bowel sounds are normal. She exhibits no distension. There is tenderness.  Musculoskeletal: Normal range of motion. She exhibits no tenderness.  Lymphadenopathy:    She has no cervical adenopathy.  Neurological: She is alert and oriented to person, place, and time.  Skin: Skin is warm and dry. No rash noted.  Psychiatric: Mood, affect and judgment normal.    Lab Results Lab Results  Component Value Date   WBC 1.7 (L) 10/16/2017   HGB 9.2 (L) 10/16/2017   HCT 28.4 (L) 10/16/2017   MCV 73.8 (L) 10/16/2017   PLT 160 10/16/2017    Lab Results  Component Value Date   CREATININE 0.97 10/16/2017   BUN 8 10/16/2017   NA 135 10/16/2017   K 5.0 10/16/2017   CL 108 10/16/2017   CO2 17 (L) 10/16/2017    Lab Results  Component Value Date   ALT 13 (L) 10/14/2017   AST 30 10/14/2017   ALKPHOS 165 (H) 10/14/2017   BILITOT 1.2 10/14/2017     Microbiology: Recent Results (from the past 240 hour(s))  Surgical pcr screen     Status: Abnormal   Collection Time:  10/15/17 10:14 AM  Result Value Ref Range Status   MRSA, PCR NEGATIVE NEGATIVE Final   Staphylococcus aureus POSITIVE (A) NEGATIVE Final    Comment: (NOTE) The Xpert SA Assay (FDA approved for NASAL specimens in patients 49 years of age and older), is one component of a comprehensive surveillance program. It is not intended to diagnose infection nor to guide or monitor treatment.      ASSESSMENT: [name] with advanced HIV/AIDS (VL > 4.5 million) with gallbladder dysfunction (HIDA 9.2% EF 07/2017) and anemia. Now POD 1 and doing well.   PLAN: 1. Will have Akiah back in the clinic in 4 weeks to reassess viral load and CD4 and continue adherence counseling.  2. Encouraged her to get up and walk halls to help with her gas pain.  3. Counseled about timing of Biktarvy with carafate  and MVI to space them out. Hopefully we can decrease the dose of carafate.   Available as needed.    Janene Madeira, MSN, NP-C [facility] Cell: [phone] Pager: [phone]  10/16/2017  11:00 AM

## 2017-10-16 NOTE — Progress Notes (Signed)
[facility] Progress Note  1 Day Post-Op  Subjective: CC:  Abdominal pain rated 5/10, improved with Norco. States ULTRAM does not help her. Also reports continued epigastric pain, which she has a hard time describing, worse with "talking" and better when she "calms down". Tolerating liquids. +flatus. Denies nausea or vomiting.  Objective: Vital signs in last 24 hours: Temp:  [97.5 F (36.4 C)-97.8 F (36.6 C)] 97.7 F (36.5 C) (11/01 0531) Pulse Rate:  [63-99] 63 (11/01 0531) Resp:  [12-20] 18 (11/01 0531) BP: (130-165)/(87-95) 130/87 (11/01 0531) SpO2:  [100 %] 100 % (11/01 0531) Weight:  [57.2 kg (126 lb)] 57.2 kg (126 lb) (10/31 1054) Last BM Date: 10/13/17  Intake/Output from previous day: 10/31 0701 - 11/01 0700 In: 1437.5 [P.O.:210; I.V.:977.5; IV Piggyback:250] Out: 10 [Blood:10] Intake/Output this shift: No intake/output data recorded.  PE: Gen:  Alert, NAD, pleasant Pulm:  Normal effort, clear to auscultation bilaterally Abd: Soft, appropriately tender, non-distended, bowel sounds present in all 4 quadrants, incisions C/D/I Skin: warm and dry, no rashes  Psych: A&Ox3   Lab Results:   Recent Labs  10/13/17 1830 10/14/17 0633  WBC 1.5* 1.3*  HGB 8.7* 8.3*  HCT 27.8* 26.4*  PLT 211 175   BMET  Recent Labs  10/13/17 1830 10/14/17 0633  NA 138 138  K 3.4* 3.9  CL 108 111  CO2 18* 20*  GLUCOSE 81 81  BUN 13 10  CREATININE 0.79 0.79  CALCIUM 8.4* 8.2*   PT/INR No results for input(s): LABPROT, INR in the last 72 hours. CMP     Component Value Date/Time   NA 138 10/14/2017 0633   K 3.9 10/14/2017 0633   CL 111 10/14/2017 0633   CO2 20 (L) 10/14/2017 0633   GLUCOSE 81 10/14/2017 0633   GLUCOSE 93 06/11/2010   BUN 10 10/14/2017 0633   CREATININE 0.79 10/14/2017 0633   CREATININE 0.66 11/12/2016 1702   CALCIUM 8.2 (L) 10/14/2017 0633   PROT 5.4 (L) 10/14/2017 0633   ALBUMIN 2.3 (L) 10/14/2017 0633   AST 30 10/14/2017 0633   ALT  13 (L) 10/14/2017 0633   ALKPHOS 165 (H) 10/14/2017 0633   BILITOT 1.2 10/14/2017 0633   GFRNONAA >60 10/14/2017 0633   GFRAA >60 10/14/2017 0633   Lipase     Component Value Date/Time   LIPASE 45 10/12/2017 2341       Studies/Results: No results found.  Anti-infectives: Anti-infectives    Start     Dose/Rate Route Frequency Ordered Stop   10/15/17 1800  azithromycin (ZITHROMAX) 1,200 mg in dextrose 5 % 250 mL IVPB     1,200 mg 250 mL/hr over 60 Minutes Intravenous Weekly 10/15/17 1655     10/15/17 1000  cefTRIAXone (ROCEPHIN) 2 g in dextrose 5 % 50 mL IVPB    Comments:  Pharmacy may adjust dosing strength / duration / interval for maximal efficacy   2 g 100 mL/hr over 30 Minutes Intravenous To Surgery 10/15/17 0908 10/15/17 1200   10/14/17 1900  bictegravir-emtricitabine-tenofovir AF (BIKTARVY) 50-200-25 MG per tablet 1 tablet     1 tablet Oral Daily 10/14/17 1633     10/14/17 1700  dapsone tablet 100 mg     100 mg Oral Daily 10/14/17 1633     10/13/17 1015  fluconazole (DIFLUCAN) tablet 200 mg     200 mg Oral Weekly 10/13/17 1006       Assessment/Plan HIV - per Dr. Baxter Flattery; biktarvy  Hepatitis B PMH esophageal  stricture/candidiasis  Anemia  GERD - PPI  Chest pain - troponin negative x 4  RUQ and epigastric abdominal pain with weight loss - GI consult  Biliary dyskinesia POD#1 s/p laparoscopic cholecystectomy 10/15/17 Dr. Coralie Keens  - afebrile, VSS - advance diet to low fat as tolerated - PRN analgesics for pain - currently receiving PRN tylenol, Norco, ULTRAM, Morphine; wean narcotics as able  - mobilize, IS  - stable for discharge from surgical perspective.   FEN: full liquid diet, advance to low fat as tolerated; BOOST TID;  ID: Fluconazole 10/29 (weekly),  Azithromycin 10/31 (MAC), Rocephin 10/31 (surgical prophylaxis)  VTE: Foley: none Follow up: [facility] 10/30/17 @ 2:00 PM    LOS: 2 days    Jill Alexanders , [facility]  Surgery 10/16/2017, 8:09 AM Pager: [phone] Consults: [phone] Mon-Fri 7:00 am-4:30 pm Sat-Sun 7:00 am-11:30 am

## 2017-10-17 DIAGNOSIS — R52 Pain, unspecified: Secondary | ICD-10-CM

## 2017-10-17 DIAGNOSIS — R0789 Other chest pain: Secondary | ICD-10-CM

## 2017-10-17 LAB — CBC
HEMATOCRIT: 25 % — AB (ref 36.0–46.0)
Hemoglobin: 8.2 g/dL — ABNORMAL LOW (ref 12.0–15.0)
MCH: 24.3 pg — AB (ref 26.0–34.0)
MCHC: 32.8 g/dL (ref 30.0–36.0)
MCV: 74.2 fL — AB (ref 78.0–100.0)
PLATELETS: 181 10*3/uL (ref 150–400)
RBC: 3.37 MIL/uL — AB (ref 3.87–5.11)
RDW: 22.4 % — ABNORMAL HIGH (ref 11.5–15.5)
WBC: 1.1 10*3/uL — CL (ref 4.0–10.5)

## 2017-10-17 LAB — BASIC METABOLIC PANEL
ANION GAP: 8 (ref 5–15)
BUN: 9 mg/dL (ref 6–20)
CHLORIDE: 107 mmol/L (ref 101–111)
CO2: 19 mmol/L — AB (ref 22–32)
Calcium: 8.5 mg/dL — ABNORMAL LOW (ref 8.9–10.3)
Creatinine, Ser: 0.91 mg/dL (ref 0.44–1.00)
GFR calc Af Amer: >60 mL/min (ref >60)
GLUCOSE: 124 mg/dL — AB (ref 65–99)
POTASSIUM: 3.7 mmol/L (ref 3.5–5.1)
Sodium: 134 mmol/L — ABNORMAL LOW (ref 135–145)

## 2017-10-17 LAB — HEPATITIS B SURFACE ANTIBODY,QUALITATIVE: HEP B S AB: REACTIVE

## 2017-10-17 LAB — HEPATITIS B SURFACE ANTIGEN: Hepatitis B Surface Ag: NEGATIVE

## 2017-10-17 LAB — HEPATITIS B CORE ANTIBODY, TOTAL: HEP B C TOTAL AB: NEGATIVE

## 2017-10-17 MED ORDER — PANTOPRAZOLE SODIUM 40 MG PO TBEC
40.0000 mg | DELAYED_RELEASE_TABLET | Freq: Two times a day (BID) | ORAL | Status: DC
  Administered 2017-10-17 – 2017-10-24 (×9): 40 mg via ORAL
  Filled 2017-10-17 (×13): qty 1

## 2017-10-17 NOTE — Progress Notes (Signed)
CRITICAL VALUE ALERT  Critical Value:  WBC 1.1  Date & Time Notied: 11/2 @ 0516  Provider Notified: Raliegh Ip Schorr @ [phone]  Orders Received/Actions taken: Awaiting response. Pt already on protective isolation. Will ctm.

## 2017-10-17 NOTE — Progress Notes (Addendum)
PROGRESS NOTE  [name]:[id number] DOB: 26-Jan-1976 DOA: 10/13/2017 PCP: Nolene Ebbs, MD  HPI/Recap of past 24 hours: [name] was seen and examined at her bedside. Reports nausea and generalized weakness with fatigue.  Assessment/Plan: Active Problems:   Anemia   Nausea & vomiting   Nausea and vomiting   Biliary dyskinesia   Code Status: Full  Family Communication: No family members at bedside  Disposition Plan: Will stay another midnight to continue current management.   Consultants:  GI  Procedures:  None  Antimicrobials: IV azithromycin, po dapsone, po fluconazole, biktarvy  DVT prophylaxis:  SCDs   Objective: Vitals:   10/16/17 1408 10/16/17 2232 10/17/17 0405 10/17/17 0500  BP: 110/88 (!) 156/91 115/85   Pulse: 95 67 67   Resp: 16 18 16    Temp: 97.7 F (36.5 C) (!) 97.5 F (36.4 C) 97.8 F (36.6 C)   TempSrc: Oral Oral Oral   SpO2: 100% 100% 95%   Weight:    55.2 kg (121 lb 12.8 oz)  Height:        Intake/Output Summary (Last 24 hours) at 10/17/17 3154 Last data filed at 10/16/17 1628  Gross per 24 hour  Intake              120 ml  Output                0 ml  Net              120 ml   Filed Weights   10/15/17 0500 10/15/17 1054 10/17/17 0500  Weight: 57.2 kg (126 lb) 57.2 kg (126 lb) 55.2 kg (121 lb 12.8 oz)    Exam:  General exam: 41 year old AAF, emaciated, laying in bed in NAD Respiratory system: clear to auscultation, no wheezes, or rhonchi Cardiovascular system:RRR with no murmurs, rubs or gallops Gastrointestinal system:Hypoactive BS, diffusely tender on palpation. Central nervous system:alert and oriented x3 Extremities: moves all 4 extremities freely, emaciated. Psych: Flat affet  Data Reviewed: CBC:  Recent Labs Lab 10/12/17 2341 10/13/17 1830 10/14/17 0633 10/16/17 0634 10/17/17 0349  WBC 2.1* 1.5* 1.3* 1.7* 1.1*  HGB 6.8* 8.7* 8.3* 9.2* 8.2*  HCT 22.7* 27.8* 26.4* 28.4* 25.0*  MCV 69.4* 75.5*  75.0* 73.8* 74.2*  PLT 256 211 175 160 008   Basic Metabolic Panel:  Recent Labs Lab 10/12/17 2341 10/13/17 0159 10/13/17 1830 10/14/17 0633 10/16/17 0634 10/17/17 0349  NA 135  --  138 138 135 134*  K 2.6*  --  3.4* 3.9 5.0 3.7  CL 99*  --  108 111 108 107  CO2 23  --  18* 20* 17* 19*  GLUCOSE 96  --  81 81 133* 124*  BUN 15  --  13 10 8 9   CREATININE 0.93  --  0.79 0.79 0.97 0.91  CALCIUM 9.3  --  8.4* 8.2* 8.3* 8.5*  MG  --  1.9  --   --   --   --    GFR: Estimated Creatinine Clearance: 71.6 mL/min (by C-G formula based on SCr of 0.91 mg/dL). Liver Function Tests:  Recent Labs Lab 10/12/17 2341 10/14/17 0633  AST 34 30  ALT 14 13*  ALKPHOS 221* 165*  BILITOT 1.2 1.2  PROT 7.4 5.4*  ALBUMIN 3.2* 2.3*    Recent Labs Lab 10/12/17 2341  LIPASE 45   No results for input(s): AMMONIA in the last 168 hours. Coagulation Profile: No results for input(s): INR, PROTIME  in the last 168 hours. Cardiac Enzymes:  Recent Labs Lab 10/13/17 0728 10/13/17 1830 10/14/17 0052 10/14/17 0633  TROPONINI <0.03 <0.03 <0.03 <0.03   BNP (last 3 results) No results for input(s): PROBNP in the last 8760 hours. HbA1C: No results for input(s): HGBA1C in the last 72 hours. CBG: No results for input(s): GLUCAP in the last 168 hours. Lipid Profile: No results for input(s): CHOL, HDL, LDLCALC, TRIG, CHOLHDL, LDLDIRECT in the last 72 hours. Thyroid Function Tests: No results for input(s): TSH, T4TOTAL, FREET4, T3FREE, THYROIDAB in the last 72 hours. Anemia Panel: No results for input(s): VITAMINB12, FOLATE, FERRITIN, TIBC, IRON, RETICCTPCT in the last 72 hours. Urine analysis:    Component Value Date/Time   COLORURINE YELLOW 07/29/2017 1357   APPEARANCEUR HAZY (A) 07/29/2017 1357   LABSPEC 1.009 07/29/2017 1357   PHURINE 6.0 07/29/2017 1357   GLUCOSEU NEGATIVE 07/29/2017 1357   HGBUR LARGE (A) 07/29/2017 1357   BILIRUBINUR NEGATIVE 07/29/2017 1357   KETONESUR 5 (A)  07/29/2017 1357   PROTEINUR 100 (A) 07/29/2017 1357   UROBILINOGEN 0.2 12/07/2014 1441   NITRITE NEGATIVE 07/29/2017 1357   LEUKOCYTESUR LARGE (A) 07/29/2017 1357   Sepsis Labs: @LABRCNTIP (procalcitonin:4,lacticidven:4)  ) Recent Results (from the past 240 hour(s))  Surgical pcr screen     Status: Abnormal   Collection Time: 10/15/17 10:14 AM  Result Value Ref Range Status   MRSA, PCR NEGATIVE NEGATIVE Final   Staphylococcus aureus POSITIVE (A) NEGATIVE Final    Comment: (NOTE) The Xpert SA Assay (FDA approved for NASAL specimens in patients 38 years of age and older), is one component of a comprehensive surveillance program. It is not intended to diagnose infection nor to guide or monitor treatment.       Studies: No results found.  Scheduled Meds: . benztropine  1 mg Oral Daily  . bictegravir-emtricitabine-tenofovir AF  1 tablet Oral Daily  . Chlorhexidine Gluconate Cloth  6 each Topical Daily  . dapsone  100 mg Oral Daily  . feeding supplement  1 Container Oral TID BM  . fluconazole  200 mg Oral Weekly  . multivitamin  15 mL Oral Daily  . mupirocin ointment  1 application Nasal BID  . OLANZapine  5 mg Oral QHS  . pantoprazole (PROTONIX) IV  40 mg Intravenous Q12H  . pneumococcal 23 valent vaccine  0.5 mL Intramuscular Tomorrow-1000  . sucralfate  1 g Oral TID WC & HS    Continuous Infusions: . azithromycin Stopped (10/15/17 1906)     LOS: 3 days   Assessment & Plan:  Biliary dyskinesia s/p cholecystectomy POD # 2 - s/p cholecystectomy 10/31 - Improving - ambulate patient - OOB to chair  H/o esophageal candidiasis -on weekly fluconazole -outpatient GI follow up (schooler)  Severe leukopenia - wbc 1.1 - if persists consult Hemonc  Microcytic anemia -Hg 8.0 baseline 9 -post 1U prbc -CBC  HIV/Hepatitis B ID resumed meds -patient has not been taking medications for last 1 month as she was moving  GERD -Protonix po,  carafate  bipolar -resume home meds    DVT prophylaxis:  SCD's  Code Status: Full Code   Family Communication:  No family members at bedside.   Disposition Plan:  Home once tolerating PO   Consultants:   ID  GI   surgery  Procedures:   Cholecystectomy    Kayleen Memos, MD [facility] Pager [phone]  If 7PM-7AM, please contact night-coverage [email or web address] Password [facility] 10/17/2017, 8:32 AM

## 2017-10-17 NOTE — Progress Notes (Signed)
[facility] Progress Note  2 Days Post-Op  Subjective: CC:  Reports persistent epigastric pain that was exacerbated after eating tomato soup yesterday. Overall feels like her pain is improving s/p cholecystectomy. Tolerating PO. Reports walking in hallway yesterday.  Objective: Vital signs in last 24 hours: Temp:  [97.5 F (36.4 C)-97.8 F (36.6 C)] 97.8 F (36.6 C) (11/02 0405) Pulse Rate:  [67-95] 67 (11/02 0405) Resp:  [16-18] 16 (11/02 0405) BP: (110-156)/(85-91) 115/85 (11/02 0405) SpO2:  [95 %-100 %] 95 % (11/02 0405) Weight:  [55.2 kg (121 lb 12.8 oz)] 55.2 kg (121 lb 12.8 oz) (11/02 0500) Last BM Date: 10/13/17  Intake/Output from previous day: 11/01 0701 - 11/02 0700 In: 120 [P.O.:120] Out: -  Intake/Output this shift: No intake/output data recorded.  PE:  Gen:  Alert, NAD, pleasant Pulm:  Normal effort, clear to auscultation bilaterally Abd: Soft, appropriately tender, non-distended, bowel sounds present in all 4 quadrants, incisions C/D/I Skin: warm and dry, no rashes  Psych: A&Ox3   Lab Results:   Recent Labs  10/16/17 0634 10/17/17 0349  WBC 1.7* 1.1*  HGB 9.2* 8.2*  HCT 28.4* 25.0*  PLT 160 181   BMET  Recent Labs  10/16/17 0634 10/17/17 0349  NA 135 134*  K 5.0 3.7  CL 108 107  CO2 17* 19*  GLUCOSE 133* 124*  BUN 8 9  CREATININE 0.97 0.91  CALCIUM 8.3* 8.5*   PT/INR No results for input(s): LABPROT, INR in the last 72 hours. CMP     Component Value Date/Time   NA 134 (L) 10/17/2017 0349   K 3.7 10/17/2017 0349   CL 107 10/17/2017 0349   CO2 19 (L) 10/17/2017 0349   GLUCOSE 124 (H) 10/17/2017 0349   GLUCOSE 93 06/11/2010   BUN 9 10/17/2017 0349   CREATININE 0.91 10/17/2017 0349   CREATININE 0.66 11/12/2016 1702   CALCIUM 8.5 (L) 10/17/2017 0349   PROT 5.4 (L) 10/14/2017 0633   ALBUMIN 2.3 (L) 10/14/2017 0633   AST 30 10/14/2017 0633   ALT 13 (L) 10/14/2017 0633   ALKPHOS 165 (H) 10/14/2017 0633   BILITOT 1.2  10/14/2017 0633   GFRNONAA >60 10/17/2017 0349   GFRAA >60 10/17/2017 0349   Lipase     Component Value Date/Time   LIPASE 45 10/12/2017 2341   Studies/Results: No results found.  Anti-infectives: Anti-infectives    Start     Dose/Rate Route Frequency Ordered Stop   10/15/17 1800  azithromycin (ZITHROMAX) 1,200 mg in dextrose 5 % 250 mL IVPB     1,200 mg 250 mL/hr over 60 Minutes Intravenous Weekly 10/15/17 1655     10/15/17 1000  cefTRIAXone (ROCEPHIN) 2 g in dextrose 5 % 50 mL IVPB    Comments:  Pharmacy may adjust dosing strength / duration / interval for maximal efficacy   2 g 100 mL/hr over 30 Minutes Intravenous To Surgery 10/15/17 0908 10/15/17 1200   10/14/17 1900  bictegravir-emtricitabine-tenofovir AF (BIKTARVY) 50-200-25 MG per tablet 1 tablet     1 tablet Oral Daily 10/14/17 1633     10/14/17 1700  dapsone tablet 100 mg     100 mg Oral Daily 10/14/17 1633     10/13/17 1015  fluconazole (DIFLUCAN) tablet 200 mg     200 mg Oral Weekly 10/13/17 1006       Assessment/Plan HIV - per Dr. Baxter Flattery; biktarvy  Hepatitis B PMH esophageal stricture/candidiasis  Anemia  GERD - PPI  Chest pain - troponin  negative x 4  RUQ and epigastric abdominal pain with weight loss - GI consult  Biliary dyskinesia POD#2 s/p laparoscopic cholecystectomy 10/15/17 Dr. Coralie Keens  - afebrile, VSS  - tolerating SOFT diet advance diet to low fat as tolerated - PRN analgesics for pain - mobilize, IS  - stable for discharge from surgical perspective. We will sign off. Call with questions or concerns.  FEN: SOFT, advance to low fat as tolerated; BOOST TID;  ID: Fluconazole 10/29 (weekly),  Azithromycin 10/31 (MAC), Rocephin 10/31 (surgical prophylaxis)  VTE: SCD's  Foley: none Follow up: [facility] 10/30/17 @ 2:00 PM    LOS: 3 days    Jill Alexanders , [facility] 10/17/2017, 7:15 AM Pager: [phone] Consults: [phone] Mon-Fri 7:00 am-4:30  pm Sat-Sun 7:00 am-11:30 am

## 2017-10-18 DIAGNOSIS — D72819 Decreased white blood cell count, unspecified: Secondary | ICD-10-CM

## 2017-10-18 DIAGNOSIS — D509 Iron deficiency anemia, unspecified: Secondary | ICD-10-CM

## 2017-10-18 DIAGNOSIS — D46Z Other myelodysplastic syndromes: Secondary | ICD-10-CM

## 2017-10-18 DIAGNOSIS — B20 Human immunodeficiency virus [HIV] disease: Secondary | ICD-10-CM

## 2017-10-18 DIAGNOSIS — D573 Sickle-cell trait: Secondary | ICD-10-CM

## 2017-10-18 LAB — SAVE SMEAR

## 2017-10-18 LAB — CBC
HCT: 25.6 % — ABNORMAL LOW (ref 36.0–46.0)
Hemoglobin: 8.3 g/dL — ABNORMAL LOW (ref 12.0–15.0)
MCH: 24.4 pg — AB (ref 26.0–34.0)
MCHC: 32.4 g/dL (ref 30.0–36.0)
MCV: 75.3 fL — AB (ref 78.0–100.0)
PLATELETS: 158 10*3/uL (ref 150–400)
RBC: 3.4 MIL/uL — ABNORMAL LOW (ref 3.87–5.11)
RDW: 22.7 % — AB (ref 11.5–15.5)
WBC: 0.8 10*3/uL — AB (ref 4.0–10.5)

## 2017-10-18 LAB — BASIC METABOLIC PANEL
Anion gap: 7 (ref 5–15)
BUN: 8 mg/dL (ref 6–20)
CALCIUM: 8.6 mg/dL — AB (ref 8.9–10.3)
CO2: 20 mmol/L — AB (ref 22–32)
CREATININE: 0.77 mg/dL (ref 0.44–1.00)
Chloride: 106 mmol/L (ref 101–111)
GFR calc Af Amer: >60 mL/min (ref >60)
GLUCOSE: 95 mg/dL (ref 65–99)
Potassium: 3.6 mmol/L (ref 3.5–5.1)
Sodium: 133 mmol/L — ABNORMAL LOW (ref 135–145)

## 2017-10-18 MED ORDER — SIMETHICONE 80 MG PO CHEW
80.0000 mg | CHEWABLE_TABLET | Freq: Once | ORAL | Status: AC
  Administered 2017-10-18: 80 mg via ORAL
  Filled 2017-10-18: qty 1

## 2017-10-18 MED ORDER — FOLIC ACID 1 MG PO TABS
2.0000 mg | ORAL_TABLET | Freq: Every day | ORAL | Status: DC
  Administered 2017-10-18 – 2017-10-24 (×5): 2 mg via ORAL
  Filled 2017-10-18 (×6): qty 2

## 2017-10-18 MED ORDER — FILGRASTIM 480 MCG/1.6ML IJ SOLN
480.0000 ug | Freq: Once | INTRAMUSCULAR | Status: AC
  Administered 2017-10-18: 480 ug via SUBCUTANEOUS
  Filled 2017-10-18: qty 1.6

## 2017-10-18 NOTE — Progress Notes (Signed)
PROGRESS NOTE  [name] UVO:[id number] DOB: 1976/07/18 DOA: 10/13/2017 PCP: Nolene Ebbs, MD  HPI/Recap of past 24 hours: Pt seen and examined at her bedside. She reports passing lots of gas and diffused abdominal tenderness on palpation. Denies nausea or diarrhea.  Assessment/Plan: Active Problems:   Anemia   Nausea & vomiting   Nausea and vomiting   Biliary dyskinesia  Code Status: Full  Family Communication: No family members at bedside  Disposition Plan: Will stay another midnight to continue current management.   Consultants:  GI  Procedures:  None  Antimicrobials: IV azithromycin, po dapsone, po fluconazole, biktarvy   DVT prophylaxis:  SCDs    Objective: Vitals:   10/17/17 1420 10/17/17 2112 10/18/17 0531 10/18/17 1439  BP: (!) 148/85 (!) 143/83 (!) 155/92 (!) 147/96  Pulse: 69 76 78 91  Resp: 17 18 16 20   Temp: 97.8 F (36.6 C) 98.1 F (36.7 C) 99.3 F (37.4 C) 99.4 F (37.4 C)  TempSrc: Oral Oral Oral Oral  SpO2: 98% 98% 98% 96%  Weight:   59.2 kg (130 lb 9.6 oz)   Height:       No intake or output data in the 24 hours ending 10/18/17 1546 Filed Weights   10/15/17 1054 10/17/17 0500 10/18/17 0531  Weight: 57.2 kg (126 lb) 55.2 kg (121 lb 12.8 oz) 59.2 kg (130 lb 9.6 oz)    Exam: General exam: 41 year old AAF, thin built, laying in bed in NAD Respiratory system: clear to auscultation, no wheezes, or rhonchi Cardiovascular system:RRR with no murmurs, rubs or gallops Gastrointestinal system:Hypoactive BS, diffusely tender on palpation. Central nervous system:alert and oriented x3 Extremities: moves all 4 extremities freely Psych: Flat affet   Data Reviewed: CBC:  Recent Labs Lab 10/13/17 1830 10/14/17 0633 10/16/17 0634 10/17/17 0349 10/18/17 0600  WBC 1.5* 1.3* 1.7* 1.1* 0.8*  HGB 8.7* 8.3* 9.2* 8.2* 8.3*  HCT 27.8* 26.4* 28.4* 25.0* 25.6*  MCV 75.5* 75.0* 73.8* 74.2* 75.3*  PLT 211 175 160 181 742    Basic Metabolic Panel:  Recent Labs Lab 10/13/17 0159 10/13/17 1830 10/14/17 0633 10/16/17 0634 10/17/17 0349 10/18/17 0600  NA  --  138 138 135 134* 133*  K  --  3.4* 3.9 5.0 3.7 3.6  CL  --  108 111 108 107 106  CO2  --  18* 20* 17* 19* 20*  GLUCOSE  --  81 81 133* 124* 95  BUN  --  13 10 8 9 8   CREATININE  --  0.79 0.79 0.97 0.91 0.77  CALCIUM  --  8.4* 8.2* 8.3* 8.5* 8.6*  MG 1.9  --   --   --   --   --    GFR: Estimated Creatinine Clearance: 87.4 mL/min (by C-G formula based on SCr of 0.77 mg/dL). Liver Function Tests:  Recent Labs Lab 10/12/17 2341 10/14/17 0633  AST 34 30  ALT 14 13*  ALKPHOS 221* 165*  BILITOT 1.2 1.2  PROT 7.4 5.4*  ALBUMIN 3.2* 2.3*    Recent Labs Lab 10/12/17 2341  LIPASE 45   No results for input(s): AMMONIA in the last 168 hours. Coagulation Profile: No results for input(s): INR, PROTIME in the last 168 hours. Cardiac Enzymes:  Recent Labs Lab 10/13/17 0728 10/13/17 1830 10/14/17 0052 10/14/17 0633  TROPONINI <0.03 <0.03 <0.03 <0.03   BNP (last 3 results) No results for input(s): PROBNP in the last 8760 hours. HbA1C: No results for input(s): HGBA1C in  the last 72 hours. CBG: No results for input(s): GLUCAP in the last 168 hours. Lipid Profile: No results for input(s): CHOL, HDL, LDLCALC, TRIG, CHOLHDL, LDLDIRECT in the last 72 hours. Thyroid Function Tests: No results for input(s): TSH, T4TOTAL, FREET4, T3FREE, THYROIDAB in the last 72 hours. Anemia Panel: No results for input(s): VITAMINB12, FOLATE, FERRITIN, TIBC, IRON, RETICCTPCT in the last 72 hours. Urine analysis:    Component Value Date/Time   COLORURINE YELLOW 07/29/2017 1357   APPEARANCEUR HAZY (A) 07/29/2017 1357   LABSPEC 1.009 07/29/2017 1357   PHURINE 6.0 07/29/2017 1357   GLUCOSEU NEGATIVE 07/29/2017 1357   HGBUR LARGE (A) 07/29/2017 1357   BILIRUBINUR NEGATIVE 07/29/2017 1357   KETONESUR 5 (A) 07/29/2017 1357   PROTEINUR 100 (A) 07/29/2017  1357   UROBILINOGEN 0.2 12/07/2014 1441   NITRITE NEGATIVE 07/29/2017 1357   LEUKOCYTESUR LARGE (A) 07/29/2017 1357   Sepsis Labs: @LABRCNTIP (procalcitonin:4,lacticidven:4)  ) Recent Results (from the past 240 hour(s))  Surgical pcr screen     Status: Abnormal   Collection Time: 10/15/17 10:14 AM  Result Value Ref Range Status   MRSA, PCR NEGATIVE NEGATIVE Final   Staphylococcus aureus POSITIVE (A) NEGATIVE Final    Comment: (NOTE) The Xpert SA Assay (FDA approved for NASAL specimens in patients 70 years of age and older), is one component of a comprehensive surveillance program. It is not intended to diagnose infection nor to guide or monitor treatment.       Studies: No results found.  Scheduled Meds: . benztropine  1 mg Oral Daily  . bictegravir-emtricitabine-tenofovir AF  1 tablet Oral Daily  . Chlorhexidine Gluconate Cloth  6 each Topical Daily  . dapsone  100 mg Oral Daily  . feeding supplement  1 Container Oral TID BM  . fluconazole  200 mg Oral Weekly  . multivitamin  15 mL Oral Daily  . mupirocin ointment  1 application Nasal BID  . OLANZapine  5 mg Oral QHS  . pantoprazole  40 mg Oral BID  . sucralfate  1 g Oral TID WC & HS    Continuous Infusions: . azithromycin Stopped (10/15/17 1906)     LOS: 4 days   Assessment & Plan:  Biliary dyskinesia s/p cholecystectomy POD # 3 - s/p cholecystectomy 10/31 - Improving - ambulate patient  H/o esophageal candidiasis -on weekly fluconazole -outpatient GI follow up (schooler)  Severe leukopenia - wbc 0.8 - Hemonc consulted and will see the pt in the morning - Peripheral smear, CBC in the am  Microcytic anemia -Hg 8.3 baseline 9 -post 1U prbc -follow CBC, ferritin trans sat, iron  HIV/Hepatitis B ID resumed meds -patient has not been taking medications for last 1 month as she is moving  GERD -Protonix po, simethicone for excess gas, carafate  bipolar -resume home meds  DVT  prophylaxis: SCD's  Code Status: Full Code   Family Communication:  No family members at bedside.   Disposition Plan: Will stay another midnight due to severe leukopenia   Consultants:  ID  GI   Surgery  hemonc  Procedures:  Cholecystectomy 10/31   Kayleen Memos, MD [facility] Pager [phone]  If 7PM-7AM, please contact night-coverage [email or web address] Password [facility] 10/18/2017, 3:46 PM

## 2017-10-18 NOTE — Consult Note (Signed)
Referral MD  Reason for Referral: Leukopenia/microcytic anemia/poorly controlled HIV  Chief Complaint  Patient presents with  . Chest Pain  : I have low white cells.  HPI: [name] is a very charming 41 year old African-American female.  She looks a whole lot younger than this.  She has 2 children.  She has HIV.  She says she had HIV from being with the "wrong person."  She says she has had HIV for about 18 years.  Her children do not have HIV.  She is been hospitalized for almost a week.  She came in with abdominal pain.  She ultimately underwent a cholecystectomy.  This was done on October 31.  The pathology report did not show any gallstones.  She has sickle cell trait.  She is not on folic acid.  She needs to be on folic acid at 2 mg daily.  It is apparent that she is not compliant with her HIV medications.  She does see the infectious disease clinic.  About 6 weeks ago, she had a HIV RNA log of 6.64.  Her quantitative numbers were 4.4 million.  She says that she cannot remember to take her HIV medicine the same time every day.  If she does not take it at the same time, she will not take it that day.  Her white cell count has now gone back down.  She has had problems with chronic leukopenia.  She has had chronic anemia.  She is clearly iron deficient.  Some of the low MCV is from her sickle cell trait.  However, she had iron studies done about 6 weeks ago which showed low iron.  When she was admitted, her white cell count is 1.5.  Hemoglobin 8.7 and platelet count 211,000.  Today, her white cell count was 0.8.  Hemoglobin 8.3.  Platelet count 158,000.  Going through her chart, it seems as if some time between April and August.  I do not know if this is when she stopped taking her HIV medications.  She is had no fever.  She has had no rashes.  She has had no swollen lymph nodes.  She is 6 feet tall.  I do not see anything that looks like Marfan's syndrome.  She person does not  smoke.  She does not drink.  She is not sure but she does not think she has Hepatitis B.  She is not a vegetarian.  Overall, her performance status is ECOG 1.   Past Medical History:  Diagnosis Date  . Acute psychosis ([place]) 03/10/12   2nd admission in last wk for this  . Angio-edema   . Anxiety   . Asthma    inhaler 2xday  . Bipolar disorder ([place])   . Depression   . GERD (gastroesophageal reflux disease)   . Hepatitis B    /E-chart  . History of blood transfusion   . HIV positive ([place])   . Microcytic anemia    h/o per E-chart  . Noncompliance with medication regimen    /e-chart  . 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 Pyelonephritis    h/o per E-chart  . Shortness of breath dyspnea    due to Asthma  . Thyroid disease   . Urticaria   :  Past Surgical History:  Procedure Laterality Date  . BALLOON DILATION N/A 02/14/2015   Procedure: BALLOON DILATION;  Surgeon: Lear Ng, MD;  Location: [facility] ENDOSCOPY;  Service: Endoscopy;  Laterality: N/A;  .  BALLOON DILATION N/A 10/25/2016   Procedure: BALLOON DILATION;  Surgeon: Wilford Corner, MD;  Location: [facility] ENDOSCOPY;  Service: Endoscopy;  Laterality: N/A;  . BALLOON DILATION N/A 01/14/2017   Procedure: BALLOON DILATION;  Surgeon: Wilford Corner, MD;  Location: [facility] ENDOSCOPY;  Service: Endoscopy;  Laterality: N/A;  . BALLOON DILATION N/A 04/30/2017   Procedure: BALLOON DILATION;  Surgeon: Wilford Corner, MD;  Location: [facility] ENDOSCOPY;  Service: Endoscopy;  Laterality: N/A;  . CHOLECYSTECTOMY    . CHOLECYSTECTOMY N/A 10/15/2017   Procedure: LAPAROSCOPIC CHOLECYSTECTOMY WITH INTRAOPERATIVE CHOLANGIOGRAM;  Surgeon: Coralie Keens, MD;  Location: [place];  Service: General;  Laterality: N/A;  . ESOPHAGOGASTRODUODENOSCOPY N/A 07/13/2013   Procedure: ESOPHAGOGASTRODUODENOSCOPY (EGD);  Surgeon: Beryle Beams, MD;  Location: [facility] ENDOSCOPY;  Service: Endoscopy;  Laterality: N/A;   . ESOPHAGOGASTRODUODENOSCOPY N/A 08/12/2014   Procedure: ESOPHAGOGASTRODUODENOSCOPY (EGD);  Surgeon: Milus Banister, MD;  Location: [facility] ENDOSCOPY;  Service: Endoscopy;  Laterality: N/A;  . ESOPHAGOGASTRODUODENOSCOPY N/A 02/14/2015   Procedure: ESOPHAGOGASTRODUODENOSCOPY (EGD);  Surgeon: Lear Ng, MD;  Location: [facility] ENDOSCOPY;  Service: Endoscopy;  Laterality: N/A;  . ESOPHAGOGASTRODUODENOSCOPY N/A 10/25/2016   Procedure: ESOPHAGOGASTRODUODENOSCOPY (EGD);  Surgeon: Wilford Corner, MD;  Location: [facility] ENDOSCOPY;  Service: Endoscopy;  Laterality: N/A;  . ESOPHAGOGASTRODUODENOSCOPY (EGD) WITH PROPOFOL N/A 04/11/2015   Procedure: ESOPHAGOGASTRODUODENOSCOPY (EGD) WITH PROPOFOL;  Surgeon: Wilford Corner, MD;  Location: [facility] ENDOSCOPY;  Service: Endoscopy;  Laterality: N/A;  . ESOPHAGOGASTRODUODENOSCOPY (EGD) WITH PROPOFOL N/A 09/11/2015   Procedure: ESOPHAGOGASTRODUODENOSCOPY (EGD) WITH PROPOFOL;  Surgeon: Wilford Corner, MD;  Location: [facility] ENDOSCOPY;  Service: Endoscopy;  Laterality: N/A;  . ESOPHAGOGASTRODUODENOSCOPY (EGD) WITH PROPOFOL N/A 01/14/2017   Procedure: ESOPHAGOGASTRODUODENOSCOPY (EGD) WITH PROPOFOL;  Surgeon: Wilford Corner, MD;  Location: [facility] ENDOSCOPY;  Service: Endoscopy;  Laterality: N/A;  . ESOPHAGOGASTRODUODENOSCOPY (EGD) WITH PROPOFOL N/A 04/30/2017   Procedure: ESOPHAGOGASTRODUODENOSCOPY (EGD) WITH PROPOFOL;  Surgeon: Wilford Corner, MD;  Location: [place];  Service: Endoscopy;  Laterality: N/A;  . SAVORY DILATION N/A 02/14/2015   Procedure: SAVORY DILATION;  Surgeon: Lear Ng, MD;  Location: [place];  Service: Endoscopy;  Laterality: N/A;  no xray needed  . SAVORY DILATION N/A 04/11/2015   Procedure: SAVORY DILATION;  Surgeon: Wilford Corner, MD;  Location: [facility] ENDOSCOPY;  Service: Endoscopy;  Laterality: N/A;  . SAVORY DILATION N/A 09/11/2015   Procedure: SAVORY DILATION;  Surgeon: Wilford Corner, MD;  Location: [facility] ENDOSCOPY;  Service: Endoscopy;   Laterality: N/A;  . SAVORY DILATION N/A 04/30/2017   Procedure: SAVORY DILATION;  Surgeon: Wilford Corner, MD;  Location: [place];  Service: Endoscopy;  Laterality: N/A;  . THYROID LOBECTOMY  08/2002   left & isthmectomy; for benign thyroid adenoma/E-chart  . THYROID SURGERY    . TUBAL LIGATION  07/2002   /E-chart  :   Current Facility-Administered Medications:  .  acetaminophen (TYLENOL) tablet 650 mg, 650 mg, Oral, Q6H PRN **OR** acetaminophen (TYLENOL) suppository 650 mg, 650 mg, Rectal, Q6H PRN, Jani Gravel, MD .  albuterol (PROVENTIL) (2.5 MG/3ML) 0.083% nebulizer solution 2.5 mg, 2.5 mg, Nebulization, Q4H PRN, Jani Gravel, MD .  alum & mag hydroxide-simeth (MAALOX/MYLANTA) 200-200-20 MG/5ML suspension 15 mL, 15 mL, Oral, Q6H PRN, Vann, Jessica U, DO, 15 mL at 10/16/17 1948 .  azithromycin (ZITHROMAX) 1,200 mg in dextrose 5 % 250 mL IVPB, 1,200 mg, Intravenous, Weekly, Dixon, Melton Krebs, NP, Stopped at 10/15/17 1906 .  benztropine (COGENTIN) tablet 1 mg, 1 mg, Oral, Daily, Vann, Jessica U, DO, 1 mg at 10/18/17 0953 .  bictegravir-emtricitabine-tenofovir  AF (BIKTARVY) 50-200-25 MG per tablet 1 tablet, 1 tablet, Oral, Daily, [place] Callas, NP, 1 tablet at 10/17/17 1845 .  Chlorhexidine Gluconate Cloth 2 % PADS 6 each, 6 each, Topical, Daily, Eulogio Bear U, DO, 6 each at 10/18/17 1148 .  dapsone tablet 100 mg, 100 mg, Oral, Daily, Dixon, Stephanie N, NP, 100 mg at 10/18/17 1150 .  feeding supplement (BOOST / RESOURCE BREEZE) liquid 1 Container, 1 Container, Oral, TID BM, Vann, Jessica U, DO, 1 Container at 10/18/17 [phone] .  filgrastim (NEUPOGEN) injection 480 mcg, 480 mcg, Subcutaneous, ONCE-1800, Rajni Holsworth R, MD .  fluconazole (DIFLUCAN) tablet 200 mg, 200 mg, Oral, Weekly, Jani Gravel, MD, 200 mg at 10/13/17 1235 .  folic acid (FOLVITE) tablet 2 mg, 2 mg, Oral, Daily, Shonta Phillis, Rudell Cobb, MD .  HYDROcodone-acetaminophen (NORCO/VICODIN) 5-325 MG per tablet 1-2 tablet, 1-2  tablet, Oral, Q6H PRN, Bodenheimer, Charles A, NP, 2 tablet at 10/18/17 1149 .  multivitamin liquid 15 mL, 15 mL, Oral, Daily, Dixon, Stephanie N, NP, 15 mL at 10/17/17 2109 .  mupirocin ointment (BACTROBAN) 2 % 1 application, 1 application, Nasal, BID, Vann, Jessica U, DO, 1 application at 79/98/72 1148 .  OLANZapine (ZYPREXA) tablet 5 mg, 5 mg, Oral, QHS, Vann, Jessica U, DO, 5 mg at 10/17/17 2109 .  ondansetron (ZOFRAN) injection 4 mg, 4 mg, Intravenous, Q6H PRN, Jani Gravel, MD, 4 mg at 10/15/17 [phone] .  ondansetron (ZOFRAN-ODT) disintegrating tablet 8 mg, 8 mg, Oral, Q8H PRN, Jani Gravel, MD .  pantoprazole (PROTONIX) EC tablet 40 mg, 40 mg, Oral, BID, Alvira Philips, , 40 mg at 10/18/17 0953 .  sucralfate ([name]) 1 GM/10ML suspension 1 g, 1 g, Oral, TID WC & HS, Vann, Jessica U, DO, 1 g at 10/18/17 1150 .  traMADol (ULTRAM) tablet 50 mg, 50 mg, Oral, Q6H PRN, Jani Gravel, MD, 50 mg at 10/16/17 1954:  . benztropine  1 mg Oral Daily  . bictegravir-emtricitabine-tenofovir AF  1 tablet Oral Daily  . Chlorhexidine Gluconate Cloth  6 each Topical Daily  . dapsone  100 mg Oral Daily  . feeding supplement  1 Container Oral TID BM  . filgrastim (NEUPOGEN)  SQ  480 mcg Subcutaneous ONCE-1800  . fluconazole  200 mg Oral Weekly  . folic acid  2 mg Oral Daily  . multivitamin  15 mL Oral Daily  . mupirocin ointment  1 application Nasal BID  . OLANZapine  5 mg Oral QHS  . pantoprazole  40 mg Oral BID  . sucralfate  1 g Oral TID WC & HS  :  Allergies  Allergen Reactions  . Other Hives and Itching    Berries, TREE NUTS  . Peanut-Containing Drug Products Hives  . Shellfish Allergy Hives  . Bactrim Itching  . Orange Fruit Itching  . Sulfa Antibiotics Itching  :  Family History  Problem Relation Age of Onset  . Cancer Mother   . Diabetes Mother   . Diabetes Father   . Heart disease Father   . Diabetes Sister   . Urticaria Sister   . Asthma Son   . Allergic rhinitis Neg Hx   .  Eczema Neg Hx   . Immunodeficiency Neg Hx   :  Social History   Social History  . Marital status: Single    Spouse name: N/A  . Number of children: N/A  . Years of education: N/A   Occupational History  . Not on file.   Social History Main  Topics  . Smoking status: Never Smoker  . Smokeless tobacco: Never Used  . Alcohol use No  . Drug use: No  . Sexual activity: No     Comment: declined condoms   Other Topics Concern  . Not on file   Social History Narrative  . No narrative on file  :  Pertinent items are noted in HPI.  Exam: Patient Vitals for the past 24 hrs:  BP Temp Temp src Pulse Resp SpO2 Weight  10/18/17 1439 (!) 147/96 99.4 F (37.4 C) Oral 91 20 96 % -  10/18/17 0531 (!) 155/92 99.3 F (37.4 C) Oral 78 16 98 % 130 lb 9.6 oz (59.2 kg)  10/17/17 2112 (!) 143/83 98.1 F (36.7 C) Oral 76 18 98 % -   As above   Recent Labs  10/17/17 0349 10/18/17 0600  WBC 1.1* 0.8*  HGB 8.2* 8.3*  HCT 25.0* 25.6*  PLT 181 158    Recent Labs  10/17/17 0349 10/18/17 0600  NA 134* 133*  K 3.7 3.6  CL 107 106  CO2 19* 20*  GLUCOSE 124* 95  BUN 9 8  CREATININE 0.91 0.77  CALCIUM 8.5* 8.6*    Blood smear review: No blood smear made.  Pathology: None    Assessment and Plan: [name] is a very nice 41 year old African-American female.  She has sickle cell trait.  She is HIV positive.  She has a leukopenia.  She has anemia.  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  This is very common in poorly controlled HIV disease.  I do not see anything on her exam or the history that would suggest a malignancy.  I know that with HIV, there is always a risk of HIV associated malignancies (i.e. lymphoma, Kaposi's, etc.) and that a bone marrow biopsy is often necessary.  I think that we should give her a "test".  I will give her a dose of Neupogen tonight.  I talked to her about this.  It will be interesting to see if her white cell count is up  in the morning.  She is on quite a few medications.  It is possible that these medications also could be causing some bone marrow suppression.  As for her anemia, I will get her on folic acid.  I will also check her iron studies.  Again I suspect that she probably is iron deficient.  Again, I really believe that if she were compliant with her HIV medications, then she would improve her leukopenia.    I just wonder what happened between April and August of this year that caused the white cells to drop.  She is very nice.  She is very pleasant to talk to.  I think that she understands what is going on.  She agrees to the Neupogen.  I talked her about the possibility of her getting some arthralgias after the Neupogen is given.  I spent about 50 minutes with her.  She is quite nice.  Hopefully, she will be able to go home tomorrow.  Madison Haw, MD  [name] 29:11

## 2017-10-19 DIAGNOSIS — B3781 Candidal esophagitis: Secondary | ICD-10-CM

## 2017-10-19 DIAGNOSIS — D508 Other iron deficiency anemias: Secondary | ICD-10-CM

## 2017-10-19 LAB — CBC WITH DIFFERENTIAL/PLATELET
BASOS ABS: 0 10*3/uL (ref 0.0–0.1)
Basophils Relative: 0 %
EOS ABS: 0 10*3/uL (ref 0.0–0.7)
EOS PCT: 0 %
HCT: 24.5 % — ABNORMAL LOW (ref 36.0–46.0)
Hemoglobin: 7.9 g/dL — ABNORMAL LOW (ref 12.0–15.0)
LYMPHS ABS: 0.2 10*3/uL — AB (ref 0.7–4.0)
Lymphocytes Relative: 7 %
MCH: 24.2 pg — ABNORMAL LOW (ref 26.0–34.0)
MCHC: 32.2 g/dL (ref 30.0–36.0)
MCV: 74.9 fL — AB (ref 78.0–100.0)
MONO ABS: 0.6 10*3/uL (ref 0.1–1.0)
Monocytes Relative: 22 %
NEUTROS PCT: 71 %
Neutro Abs: 2.1 10*3/uL (ref 1.7–7.7)
PLATELETS: 151 10*3/uL (ref 150–400)
RBC: 3.27 MIL/uL — AB (ref 3.87–5.11)
RDW: 22.8 % — AB (ref 11.5–15.5)
WBC: 2.9 10*3/uL — AB (ref 4.0–10.5)

## 2017-10-19 LAB — IRON AND TIBC
Iron: 12 ug/dL — ABNORMAL LOW (ref 28–170)
Saturation Ratios: 7 % — ABNORMAL LOW (ref 10.4–31.8)
TIBC: 171 ug/dL — ABNORMAL LOW (ref 250–450)
UIBC: 159 ug/dL

## 2017-10-19 LAB — RETICULOCYTES
RBC.: 3.3 MIL/uL — AB (ref 3.87–5.11)
RETIC COUNT ABSOLUTE: 16.5 10*3/uL — AB (ref 19.0–186.0)
RETIC CT PCT: 0.5 % (ref 0.4–3.1)

## 2017-10-19 LAB — SAVE SMEAR

## 2017-10-19 LAB — FERRITIN: Ferritin: 1300 ng/mL — ABNORMAL HIGH (ref 11–307)

## 2017-10-19 MED ORDER — SENNOSIDES-DOCUSATE SODIUM 8.6-50 MG PO TABS
1.0000 | ORAL_TABLET | Freq: Two times a day (BID) | ORAL | Status: DC
  Administered 2017-10-19 – 2017-10-24 (×4): 1 via ORAL
  Filled 2017-10-19 (×9): qty 1

## 2017-10-19 MED ORDER — GI COCKTAIL ~~LOC~~
30.0000 mL | Freq: Once | ORAL | Status: AC
  Administered 2017-10-19: 30 mL via ORAL
  Filled 2017-10-19: qty 30

## 2017-10-19 NOTE — Progress Notes (Signed)
PROGRESS NOTE  [name] AYT:[id number] DOB: 29-Sep-1976 DOA: 10/13/2017 PCP: Nolene Ebbs, MD  HPI/Recap of past 24 hours: No acute events overnight. This morning pt reports discomfort when she swallows. GI cocktail ordered. On po fluconazole. No other complaints. No nausea or abd pain. Seen by hemonc this am, neupogen given last night with good response.  Assessment/Plan: Active Problems:   Anemia   Nausea & vomiting   Nausea and vomiting   Biliary dyskinesia  Code Status:Full  Family Communication:No family members at bedside  Disposition Plan:Will stay another midnight to continue current management.   Consultants:  GI  Procedures:  None  Antimicrobials: IV azithromycin, po dapsone, po fluconazole, biktarvy   DVT prophylaxis: SCDs   Objective: Vitals:   10/18/17 2131 10/19/17 0445 10/19/17 0500 10/19/17 1444  BP: 135/82 139/83  130/83  Pulse: (!) 119 95  (!) 104  Resp: 20 18  18   Temp: 100.2 F (37.9 C) (!) 100.7 F (38.2 C)  (!) 100.4 F (38 C)  TempSrc: Oral Oral  Oral  SpO2: 95% 100%  95%  Weight:   57.7 kg (127 lb 3.3 oz)   Height:       No intake or output data in the 24 hours ending 10/19/17 1615 Filed Weights   10/17/17 0500 10/18/17 0531 10/19/17 0500  Weight: 55.2 kg (121 lb 12.8 oz) 59.2 kg (130 lb 9.6 oz) 57.7 kg (127 lb 3.3 oz)    Exam: General exam: Pleasant 41 year old AAF, thin built, laying in bed in NAD Respiratory system: clear to auscultation, no wheezes, or rhonchi Cardiovascular system:RRR with no murmurs, rubs or gallops Gastrointestinal system:Hypoactive BS, diffusely tender on palpation. Central nervous system:alert and oriented x3 Extremities: moves all 4 extremities freely Psych: Mood is appropriate for condition and setting   Data Reviewed: CBC: Recent Labs  Lab 10/14/17 0633 10/16/17 0634 10/17/17 0349 10/18/17 0600 10/19/17 1517  WBC 1.3* 1.7* 1.1* 0.8* 2.9*  NEUTROABS  --   --   --    --  PENDING  HGB 8.3* 9.2* 8.2* 8.3* 7.9*  HCT 26.4* 28.4* 25.0* 25.6* 24.5*  MCV 75.0* 73.8* 74.2* 75.3* 74.9*  PLT 175 160 181 158 355   Basic Metabolic Panel: Recent Labs  Lab 10/13/17 0159 10/13/17 1830 10/14/17 0633 10/16/17 0634 10/17/17 0349 10/18/17 0600  NA  --  138 138 135 134* 133*  K  --  3.4* 3.9 5.0 3.7 3.6  CL  --  108 111 108 107 106  CO2  --  18* 20* 17* 19* 20*  GLUCOSE  --  81 81 133* 124* 95  BUN  --  13 10 8 9 8   CREATININE  --  0.79 0.79 0.97 0.91 0.77  CALCIUM  --  8.4* 8.2* 8.3* 8.5* 8.6*  MG 1.9  --   --   --   --   --    GFR: Estimated Creatinine Clearance: 85.1 mL/min (by C-G formula based on SCr of 0.77 mg/dL). Liver Function Tests: Recent Labs  Lab 10/12/17 2341 10/14/17 0633  AST 34 30  ALT 14 13*  ALKPHOS 221* 165*  BILITOT 1.2 1.2  PROT 7.4 5.4*  ALBUMIN 3.2* 2.3*   Recent Labs  Lab 10/12/17 2341  LIPASE 45   No results for input(s): AMMONIA in the last 168 hours. Coagulation Profile: No results for input(s): INR, PROTIME in the last 168 hours. Cardiac Enzymes: Recent Labs  Lab 10/13/17 0728 10/13/17 1830 10/14/17 0052 10/14/17  [place] <0.03 <0.03 <0.03 <0.03   BNP (last 3 results) No results for input(s): PROBNP in the last 8760 hours. HbA1C: No results for input(s): HGBA1C in the last 72 hours. CBG: No results for input(s): GLUCAP in the last 168 hours. Lipid Profile: No results for input(s): CHOL, HDL, LDLCALC, TRIG, CHOLHDL, LDLDIRECT in the last 72 hours. Thyroid Function Tests: No results for input(s): TSH, T4TOTAL, FREET4, T3FREE, THYROIDAB in the last 72 hours. Anemia Panel: Recent Labs    10/19/17 0501  FERRITIN 1,300*  TIBC 171*  IRON 12*  RETICCTPCT 0.5   Urine analysis:    Component Value Date/Time   COLORURINE YELLOW 07/29/2017 1357   APPEARANCEUR HAZY (A) 07/29/2017 1357   LABSPEC 1.009 07/29/2017 1357   PHURINE 6.0 07/29/2017 1357   GLUCOSEU NEGATIVE 07/29/2017 1357   HGBUR  LARGE (A) 07/29/2017 1357   BILIRUBINUR NEGATIVE 07/29/2017 1357   KETONESUR 5 (A) 07/29/2017 1357   PROTEINUR 100 (A) 07/29/2017 1357   UROBILINOGEN 0.2 12/07/2014 1441   NITRITE NEGATIVE 07/29/2017 1357   LEUKOCYTESUR LARGE (A) 07/29/2017 1357   Sepsis Labs: @LABRCNTIP (procalcitonin:4,lacticidven:4)  ) Recent Results (from the past 240 hour(s))  Surgical pcr screen     Status: Abnormal   Collection Time: 10/15/17 10:14 AM  Result Value Ref Range Status   MRSA, PCR NEGATIVE NEGATIVE Final   Staphylococcus aureus POSITIVE (A) NEGATIVE Final    Comment: (NOTE) The Xpert SA Assay (FDA approved for NASAL specimens in patients 47 years of age and older), is one component of a comprehensive surveillance program. It is not intended to diagnose infection nor to guide or monitor treatment.       Studies: No results found.  Scheduled Meds: . benztropine  1 mg Oral Daily  . bictegravir-emtricitabine-tenofovir AF  1 tablet Oral Daily  . Chlorhexidine Gluconate Cloth  6 each Topical Daily  . dapsone  100 mg Oral Daily  . feeding supplement  1 Container Oral TID BM  . fluconazole  200 mg Oral Weekly  . folic acid  2 mg Oral Daily  . gi cocktail  30 mL Oral Once  . multivitamin  15 mL Oral Daily  . mupirocin ointment  1 application Nasal BID  . OLANZapine  5 mg Oral QHS  . pantoprazole  40 mg Oral BID  . senna-docusate  1 tablet Oral BID  . sucralfate  1 g Oral TID WC & HS    Continuous Infusions: . azithromycin Stopped (10/15/17 1906)     LOS: 5 days   Assessment & Plan:  Biliary dyskinesia s/p cholecystectomy POD # 4 - s/p cholecystectomy 10/31 - Improving - ambulate patient  H/o esophageal candidiasis -on po fluconazole -outpatient GI follow up (schooler)  Severe leukopenia -improving post neupogen - wbc 2.9 from 0.8 - Hemonc following; we appreciate recommendations - Peripheral smear pending, CBC in the am  Microcytic anemia -anemia workup -Hg  trending down 7.6 from 8.3 baseline 9 -post 1U prbc -check FOBT -follow CBC in am  HIV/AIDS -ID resumed meds -Patient missed meds for 1 month  GERD/mild odynophagia -Protonix po, simethicone for excess gas, carafate -GI cocktail  bipolar -resume home meds  Kayleen Memos, MD [facility] Pager [phone]  If 7PM-7AM, please contact night-coverage [email or web address] Password [email or web address] 10/19/2017, 4:15 PM

## 2017-10-19 NOTE — Progress Notes (Signed)
Ms. Benning is doing okay.  I looked at her blood smear this morning.  She has good neutrophil maturation.  Even though I do not see a white cell count in her chart, on the lab work that I saw in the lab while looking at her blood smear, her white cell count was 2000.  Her iron studies clearly show iron deficiency.  Her ferritin is 1300 which is inflammatory.  Her iron saturation is only 7%.  Her reticulocyte count corrected is almost nonexistent.  We will go ahead and give her some IV iron today.  I think this will help her out.  She has had no fever.  There is been no rashes.  She has a low-grade temperature of 100.7.  Pulse is 95.  Blood pressure 139/83.  I cannot find anything focal on her physical exam.  Again, I saw that her CBC, even though it is not in the system of formally, her white cell count was better.  She responded to the Neupogen.  Again, I think if her HIV gets treated, her white cell count will improve.  I have to believe that she has an element of HIV associated myelodysplasia.  May be starting the folic acid will help a little bit.   [name], MD  Job 2:10

## 2017-10-20 ENCOUNTER — Other Ambulatory Visit: Payer: Self-pay

## 2017-10-20 LAB — CBC WITH DIFFERENTIAL/PLATELET
BAND NEUTROPHILS: 0 %
BASOS ABS: 0 10*3/uL (ref 0.0–0.1)
BASOS PCT: 1 %
BLASTS: 0 %
EOS ABS: 0 10*3/uL (ref 0.0–0.7)
Eosinophils Relative: 0 %
HCT: 22.8 % — ABNORMAL LOW (ref 36.0–46.0)
HEMOGLOBIN: 7.3 g/dL — AB (ref 12.0–15.0)
Lymphocytes Relative: 4 %
Lymphs Abs: 0.1 10*3/uL — ABNORMAL LOW (ref 0.7–4.0)
MCH: 23.9 pg — AB (ref 26.0–34.0)
MCHC: 32 g/dL (ref 30.0–36.0)
MCV: 74.8 fL — ABNORMAL LOW (ref 78.0–100.0)
METAMYELOCYTES PCT: 0 %
MONO ABS: 0.3 10*3/uL (ref 0.1–1.0)
MYELOCYTES: 0 %
Monocytes Relative: 9 %
Neutro Abs: 2.4 10*3/uL (ref 1.7–7.7)
Neutrophils Relative %: 86 %
Other: 0 %
PLATELETS: 140 10*3/uL — AB (ref 150–400)
PROMYELOCYTES ABS: 0 %
RBC: 3.05 MIL/uL — ABNORMAL LOW (ref 3.87–5.11)
RDW: 22.7 % — ABNORMAL HIGH (ref 11.5–15.5)
WBC: 2.8 10*3/uL — ABNORMAL LOW (ref 4.0–10.5)
nRBC: 0 /100 WBC

## 2017-10-20 LAB — COMPREHENSIVE METABOLIC PANEL
ALK PHOS: 225 U/L — AB (ref 38–126)
ALT: 12 U/L — ABNORMAL LOW (ref 14–54)
ANION GAP: 13 (ref 5–15)
AST: 22 U/L (ref 15–41)
Albumin: 2.3 g/dL — ABNORMAL LOW (ref 3.5–5.0)
BILIRUBIN TOTAL: 0.9 mg/dL (ref 0.3–1.2)
BUN: 9 mg/dL (ref 6–20)
CALCIUM: 8.5 mg/dL — AB (ref 8.9–10.3)
CO2: 19 mmol/L — AB (ref 22–32)
Chloride: 100 mmol/L — ABNORMAL LOW (ref 101–111)
Creatinine, Ser: 0.91 mg/dL (ref 0.44–1.00)
GFR calc non Af Amer: >60 mL/min (ref >60)
GLUCOSE: 80 mg/dL (ref 65–99)
Potassium: 3.9 mmol/L (ref 3.5–5.1)
Sodium: 132 mmol/L — ABNORMAL LOW (ref 135–145)
TOTAL PROTEIN: 5.6 g/dL — AB (ref 6.5–8.1)

## 2017-10-20 LAB — TROPONIN I: Troponin I: <0.03 ng/mL (ref <0.03)

## 2017-10-20 MED ORDER — METOCLOPRAMIDE HCL 5 MG/ML IJ SOLN
5.0000 mg | Freq: Once | INTRAMUSCULAR | Status: AC
  Administered 2017-10-20: 5 mg via INTRAVENOUS
  Filled 2017-10-20: qty 2

## 2017-10-20 MED ORDER — KETOROLAC TROMETHAMINE 15 MG/ML IJ SOLN
7.5000 mg | Freq: Once | INTRAMUSCULAR | Status: AC
  Administered 2017-10-20: 7.5 mg via INTRAVENOUS
  Filled 2017-10-20: qty 1

## 2017-10-20 MED ORDER — DIPHENHYDRAMINE HCL 50 MG/ML IJ SOLN
12.5000 mg | Freq: Once | INTRAMUSCULAR | Status: AC
  Administered 2017-10-20: 12.5 mg via INTRAVENOUS
  Filled 2017-10-20: qty 1

## 2017-10-20 MED ORDER — MORPHINE SULFATE (PF) 2 MG/ML IV SOLN
1.0000 mg | Freq: Once | INTRAVENOUS | Status: AC
  Administered 2017-10-20: 1 mg via INTRAVENOUS
  Filled 2017-10-20: qty 1

## 2017-10-20 MED ORDER — ENOXAPARIN SODIUM 40 MG/0.4ML ~~LOC~~ SOLN
40.0000 mg | SUBCUTANEOUS | Status: DC
  Filled 2017-10-20: qty 0.4

## 2017-10-20 MED ORDER — GI COCKTAIL ~~LOC~~
30.0000 mL | Freq: Three times a day (TID) | ORAL | Status: DC | PRN
  Administered 2017-10-20 – 2017-10-24 (×6): 30 mL via ORAL
  Filled 2017-10-20 (×8): qty 30

## 2017-10-20 NOTE — Progress Notes (Signed)
Patient refused PO medication at this time stating she needed to rest and may take them at 2300.  Tempeture 102.9 and patient educated that she should take tylenol and remove some of her blankets.  Patient refused to remove blankets and only accepted the tylenol if it was a suppository.  Tylenol given.  Will recheck tempeture and reassess if she will take her night time medications.

## 2017-10-20 NOTE — Progress Notes (Signed)
PROGRESS NOTE  BROOKIE WAY[address] TFT:[id number] DOB: [birth year] DOA: 10/13/2017 PCP: Nolene Ebbs, MD  HPI/Recap of past 24 hours: Pt seen and examined at her bedside. Reports chest discomfort and odynophagia. 7/10 dull pain worse when she swallows. No dyspnea or palpitations.  Assessment/Plan: Active Problems:   Anemia   Nausea & vomiting   Nausea and vomiting   Biliary dyskinesia  Code Status:Full  Family Communication:No family members at bedside  Disposition Plan:Will stay another midnight to continue current management.   Consultants:  GI  Procedures:  None  Antimicrobials: IV azithromycin, po dapsone, po fluconazole, biktarvy   DVT prophylaxis: lovenox 40 mg sq daily and SCDs     Objective: Vitals:   10/19/17 2224 10/20/17 0547 10/20/17 0700 10/20/17 0904  BP: 108/70 131/77    Pulse: 79 (!) 102    Resp: 17 17    Temp: 98.4 F (36.9 C) (!) 101.1 F (38.4 C) (!) 100.7 F (38.2 C) (!) 100.9 F (38.3 C)  TempSrc: Oral Oral Oral Oral  SpO2: 95% 95%    Weight:  57.5 kg (126 lb 12.2 oz)    Height:       No intake or output data in the 24 hours ending 10/20/17 0905 Filed Weights   10/18/17 0531 10/19/17 0500 10/20/17 0547  Weight: 59.2 kg (130 lb 9.6 oz) 57.7 kg (127 lb 3.3 oz) 57.5 kg (126 lb 12.2 oz)    Exam:  General exam:Pleasant 41 year old AAF, thin built, laying in bed in NAD Respiratory system: clearto auscultation, no wheezes, or rhonchi Cardiovascular system:RRR with no murmurs, rubs or gallops Gastrointestinal system:Hypoactive BS, diffusely tender on palpation. Central nervous system:alert and oriented x3 Extremities: moves all 4 extremities freely Psych:Mood is appropriate for condition and setting   Data Reviewed: CBC: Recent Labs  Lab 10/14/17 0633 10/16/17 0634 10/17/17 0349 10/18/17 0600 10/19/17 1517  WBC 1.3* 1.7* 1.1* 0.8* 2.9*  NEUTROABS  --   --   --   --  2.1  HGB 8.3* 9.2* 8.2* 8.3* 7.9*    HCT 26.4* 28.4* 25.0* 25.6* 24.5*  MCV 75.0* 73.8* 74.2* 75.3* 74.9*  PLT 175 160 181 158 706   Basic Metabolic Panel: Recent Labs  Lab 10/13/17 1830 10/14/17 0633 10/16/17 0634 10/17/17 0349 10/18/17 0600  NA 138 138 135 134* 133*  K 3.4* 3.9 5.0 3.7 3.6  CL 108 111 108 107 106  CO2 18* 20* 17* 19* 20*  GLUCOSE 81 81 133* 124* 95  BUN 13 10 8 9 8   CREATININE 0.79 0.79 0.97 0.91 0.77  CALCIUM 8.4* 8.2* 8.3* 8.5* 8.6*   GFR: Estimated Creatinine Clearance: 84.9 mL/min (by C-G formula based on SCr of 0.77 mg/dL). Liver Function Tests: Recent Labs  Lab 10/14/17 0633  AST 30  ALT 13*  ALKPHOS 165*  BILITOT 1.2  PROT 5.4*  ALBUMIN 2.3*   No results for input(s): LIPASE, AMYLASE in the last 168 hours. No results for input(s): AMMONIA in the last 168 hours. Coagulation Profile: No results for input(s): INR, PROTIME in the last 168 hours. Cardiac Enzymes: Recent Labs  Lab 10/13/17 1830 10/14/17 0052 10/14/17 0633  TROPONINI <0.03 <0.03 <0.03   BNP (last 3 results) No results for input(s): PROBNP in the last 8760 hours. HbA1C: No results for input(s): HGBA1C in the last 72 hours. CBG: No results for input(s): GLUCAP in the last 168 hours. Lipid Profile: No results for input(s): CHOL, HDL, LDLCALC, TRIG, CHOLHDL, LDLDIRECT in the last  72 hours. Thyroid Function Tests: No results for input(s): TSH, T4TOTAL, FREET4, T3FREE, THYROIDAB in the last 72 hours. Anemia Panel: Recent Labs    10/19/17 0501  FERRITIN 1,300*  TIBC 171*  IRON 12*  RETICCTPCT 0.5   Urine analysis:    Component Value Date/Time   COLORURINE YELLOW 07/29/2017 1357   APPEARANCEUR HAZY (A) 07/29/2017 1357   LABSPEC 1.009 07/29/2017 1357   PHURINE 6.0 07/29/2017 1357   GLUCOSEU NEGATIVE 07/29/2017 1357   HGBUR LARGE (A) 07/29/2017 1357   BILIRUBINUR NEGATIVE 07/29/2017 1357   KETONESUR 5 (A) 07/29/2017 1357   PROTEINUR 100 (A) 07/29/2017 1357   UROBILINOGEN 0.2 12/07/2014 1441    NITRITE NEGATIVE 07/29/2017 1357   LEUKOCYTESUR LARGE (A) 07/29/2017 1357   Sepsis Labs: @LABRCNTIP (procalcitonin:4,lacticidven:4)  ) Recent Results (from the past 240 hour(s))  Surgical pcr screen     Status: Abnormal   Collection Time: 10/15/17 10:14 AM  Result Value Ref Range Status   MRSA, PCR NEGATIVE NEGATIVE Final   Staphylococcus aureus POSITIVE (A) NEGATIVE Final    Comment: (NOTE) The Xpert SA Assay (FDA approved for NASAL specimens in patients 19 years of age and older), is one component of a comprehensive surveillance program. It is not intended to diagnose infection nor to guide or monitor treatment.       Studies: No results found.  Scheduled Meds: . benztropine  1 mg Oral Daily  . bictegravir-emtricitabine-tenofovir AF  1 tablet Oral Daily  . Chlorhexidine Gluconate Cloth  6 each Topical Daily  . dapsone  100 mg Oral Daily  . feeding supplement  1 Container Oral TID BM  . fluconazole  200 mg Oral Weekly  . folic acid  2 mg Oral Daily  . multivitamin  15 mL Oral Daily  . mupirocin ointment  1 application Nasal BID  . OLANZapine  5 mg Oral QHS  . pantoprazole  40 mg Oral BID  . senna-docusate  1 tablet Oral BID  . sucralfate  1 g Oral TID WC & HS    Continuous Infusions: . azithromycin Stopped (10/15/17 1906)     LOS: 6 days   Assessment & Plan:  Biliary dyskinesias/p cholecystectomy POD # 5 - s/p cholecystectomy 10/31 - Improving - ambulate patient  Chest pain most likely 2/2 to esophagitis -Morphine -EKG -Trop unremarkable -GI cocktail prn  H/o esophageal candidiasis -on po fluconazole -outpatient GI follow up (schooler) -po protonix 40 mg BID  Severe leukopenia most ikely HIV related -improving post neupogen - wbc 2.9 from 0.8 - Hemonc following; we appreciate recommendations  Microcytic anemia -anemia workup -Hg trending down 7.6 from 8.3 baseline 9 -post 1U prbc -check FOBT -follow CBC in am  HIV/AIDS -ID resumed  meds -Patient missed meds for 1 month -continue empiric treatments w azithromycin, dapsone  GERD/mild odynophagia -Protonixpo, simethicone for excess gas, carafate -GI cocktail  bipolar -resume home meds     Kayleen Memos, MD [facility] Pager [phone]  If 7PM-7AM, please contact night-coverage [email or web address] Password [email or web address] 10/20/2017, 9:05 AM

## 2017-10-20 NOTE — Progress Notes (Signed)
PT Cancellation Note  Patient Details Name: [name] MRN: [id number] DOB: 1976/01/25   Cancelled Treatment:    Reason Eval/Treat Not Completed: Patient declined, no reason specified, attempted PT x2, pt declined as she did not feel like moving at the time.  Judee Clara, SPT  Judee Clara 10/20/2017, 4:10 PM

## 2017-10-20 NOTE — Progress Notes (Signed)
[name] is doing okay.  She does feel tired.  She got iron yesterday.  She had no problems with the iron.  Her labs not yet back this morning.  She says that she had a temperature yesterday.  Her temperature was 101.1.  I am not sure if there is any localizing signs.  There is no cough.  She has had no diarrhea.  Again her white cell count came up nicely.  She is not neutropenic.  Her [place] is 2100.  Her hemoglobin has dropped.  It is 7.9.  Again given her retake count of practically 0, I just think that the bone marrow has a very little ability to make blood.  The iron and folic acid will help that.  It may take another couple weeks.  Her appetite is marginal.  On her exam, there is no oral lesions.  She has no adenopathy in the neck.  Her lungs are clear bilaterally.  Cardiac exam regular rate and rhythm with no murmurs.  Abdomen is soft.  Bowel sounds are present.  There is no fluid wave.  There is no palpable splenomegaly.  Skin exam shows no rashes, ecchymosis or petechia.  Neurological exam shows no focal neurological deficits.  We will have to see what her labs are.  We will have to see what her hemo-globin is.  If it is dropped further, she may need to be transfused.  Again, I feel that her leukopenia is secondary to her HIV being uncontrolled.  May be, this will be the motivation that she needs to take her HIV medications.  I spent about 25 minutes with her this morning.  I answered some of her questions.  Madison Haw, MD  [name] 5:8

## 2017-10-21 ENCOUNTER — Inpatient Hospital Stay (HOSPITAL_COMMUNITY): Payer: Medicaid Other

## 2017-10-21 DIAGNOSIS — R509 Fever, unspecified: Secondary | ICD-10-CM

## 2017-10-21 LAB — CBC WITH DIFFERENTIAL/PLATELET
BASOS ABS: 0 10*3/uL (ref 0.0–0.1)
Basophils Relative: 1 %
Eosinophils Absolute: 0 10*3/uL (ref 0.0–0.7)
Eosinophils Relative: 1 %
HCT: 21.6 % — ABNORMAL LOW (ref 36.0–46.0)
HEMOGLOBIN: 6.9 g/dL — AB (ref 12.0–15.0)
LYMPHS ABS: 0.1 10*3/uL — AB (ref 0.7–4.0)
Lymphocytes Relative: 6 %
MCH: 23.9 pg — AB (ref 26.0–34.0)
MCHC: 31.9 g/dL (ref 30.0–36.0)
MCV: 74.7 fL — ABNORMAL LOW (ref 78.0–100.0)
MONO ABS: 0.3 10*3/uL (ref 0.1–1.0)
Monocytes Relative: 13 %
NEUTROS ABS: 1.6 10*3/uL — AB (ref 1.7–7.7)
Neutrophils Relative %: 79 %
Platelets: 139 10*3/uL — ABNORMAL LOW (ref 150–400)
RBC: 2.89 MIL/uL — AB (ref 3.87–5.11)
RDW: 22.6 % — AB (ref 11.5–15.5)
WBC: 2 10*3/uL — AB (ref 4.0–10.5)

## 2017-10-21 LAB — URINALYSIS, ROUTINE W REFLEX MICROSCOPIC
Bilirubin Urine: NEGATIVE
Glucose, UA: NEGATIVE mg/dL
KETONES UR: NEGATIVE mg/dL
Nitrite: NEGATIVE
PH: 6 (ref 5.0–8.0)
Protein, ur: NEGATIVE mg/dL
SPECIFIC GRAVITY, URINE: 1.015 (ref 1.005–1.030)

## 2017-10-21 LAB — COMPREHENSIVE METABOLIC PANEL
ALBUMIN: 2.3 g/dL — AB (ref 3.5–5.0)
ALK PHOS: 309 U/L — AB (ref 38–126)
ALT: 14 U/L (ref 14–54)
AST: 39 U/L (ref 15–41)
Anion gap: 8 (ref 5–15)
BILIRUBIN TOTAL: 1.3 mg/dL — AB (ref 0.3–1.2)
BUN: 8 mg/dL (ref 6–20)
CALCIUM: 8.3 mg/dL — AB (ref 8.9–10.3)
CO2: 22 mmol/L (ref 22–32)
CREATININE: 0.9 mg/dL (ref 0.44–1.00)
Chloride: 102 mmol/L (ref 101–111)
GFR calc Af Amer: >60 mL/min (ref >60)
GLUCOSE: 99 mg/dL (ref 65–99)
Potassium: 3.2 mmol/L — ABNORMAL LOW (ref 3.5–5.1)
Sodium: 132 mmol/L — ABNORMAL LOW (ref 135–145)
TOTAL PROTEIN: 5.4 g/dL — AB (ref 6.5–8.1)

## 2017-10-21 LAB — PREPARE RBC (CROSSMATCH)

## 2017-10-21 LAB — HEMOGLOBINOPATHY EVALUATION
HGB A: 73.8 % — AB (ref 96.4–98.8)
HGB C: 0 %
HGB F QUANT: 0 % (ref 0.0–2.0)
HGB VARIANT: 0 %
Hgb A2 Quant: 3.6 % — ABNORMAL HIGH (ref 1.8–3.2)
Hgb S Quant: 22.6 % — ABNORMAL HIGH

## 2017-10-21 MED ORDER — VANCOMYCIN HCL IN DEXTROSE 750-5 MG/150ML-% IV SOLN
750.0000 mg | Freq: Two times a day (BID) | INTRAVENOUS | Status: DC
  Administered 2017-10-21 – 2017-10-22 (×2): 750 mg via INTRAVENOUS
  Filled 2017-10-21 (×2): qty 150

## 2017-10-21 MED ORDER — SODIUM CHLORIDE 0.9 % IV SOLN: Freq: Once | INTRAVENOUS | Status: DC

## 2017-10-21 MED ORDER — IOPAMIDOL (ISOVUE-300) INJECTION 61%
INTRAVENOUS | Status: AC
  Administered 2017-10-21: 100 mL
  Filled 2017-10-21: qty 100

## 2017-10-21 MED ORDER — SODIUM CHLORIDE 0.9 % IV SOLN
INTRAVENOUS | Status: DC
  Administered 2017-10-21 – 2017-10-23 (×3): via INTRAVENOUS

## 2017-10-21 MED ORDER — VANCOMYCIN HCL 10 G IV SOLR
1250.0000 mg | Freq: Once | INTRAVENOUS | Status: AC
  Administered 2017-10-21: 1250 mg via INTRAVENOUS
  Filled 2017-10-21: qty 1250

## 2017-10-21 MED ORDER — FILGRASTIM 480 MCG/1.6ML IJ SOLN
480.0000 ug | Freq: Every day | INTRAMUSCULAR | Status: DC
  Filled 2017-10-21 (×2): qty 1.6

## 2017-10-21 MED ORDER — DEXTROSE 5 % IV SOLN
1000.0000 mg | Freq: Three times a day (TID) | INTRAVENOUS | Status: DC
  Filled 2017-10-21 (×2): qty 1

## 2017-10-21 MED ORDER — MORPHINE SULFATE (PF) 2 MG/ML IV SOLN
2.0000 mg | Freq: Once | INTRAVENOUS | Status: AC
  Administered 2017-10-21: 2 mg via INTRAVENOUS
  Filled 2017-10-21: qty 1

## 2017-10-21 MED ORDER — IOPAMIDOL (ISOVUE-300) INJECTION 61%
INTRAVENOUS | Status: AC
  Filled 2017-10-21: qty 30

## 2017-10-21 MED ORDER — PROMETHAZINE HCL 25 MG/ML IJ SOLN
6.2500 mg | Freq: Four times a day (QID) | INTRAMUSCULAR | Status: DC | PRN
  Administered 2017-10-21 – 2017-10-23 (×5): 6.25 mg via INTRAVENOUS
  Filled 2017-10-21 (×5): qty 1

## 2017-10-21 MED ORDER — CIPROFLOXACIN IN D5W 400 MG/200ML IV SOLN: 400.0000 mg | Freq: Two times a day (BID) | INTRAVENOUS | Status: DC

## 2017-10-21 MED ORDER — DEXTROSE 5 % IV SOLN
2.0000 g | Freq: Three times a day (TID) | INTRAVENOUS | Status: DC
  Administered 2017-10-21 – 2017-10-22 (×6): 2 g via INTRAVENOUS
  Filled 2017-10-21 (×8): qty 2

## 2017-10-21 MED ORDER — SODIUM CHLORIDE 0.9 % IV SOLN
Freq: Once | INTRAVENOUS | Status: AC
  Administered 2017-10-21: 09:00:00 via INTRAVENOUS

## 2017-10-21 NOTE — Progress Notes (Signed)
Pt resting in bed quietly. Easily aroused. C/o pain in the mouth and states that she cannot swallow any of her pills at this time and she is requesting a GI cocktail and states that she will not take her pills until she get it. T/c placed to on call provider for [facility] and page completed. Awaiting return phone call.

## 2017-10-21 NOTE — Consult Note (Signed)
[facility]    Date of Admission:  10/13/2017     Total days of antibiotics 1  Vancomycin 10/21/2017   Cefepime 10/21/2017   Dapsone/Azithromycin OI proph 10/31               Reason for Consult: fevers, neutropenia, HIV/AIDS    Referring Provider: [place] [name]  Primary Care Provider: Nolene Ebbs, MD   Assessment: [name] POD 6 lap chole with advanced HIV/AIDS (CD4 10, VL 4.4 million) now with fevers, low WBC count and anemia. Recently placed back on ART (Biktarvy) and at risk for IRIS. Has not had BM in 1.5 weeks per her account; diffuse abdominal tenderness.   Plan: 1. Would check CT of abdomen/pelvis with recent abdominal surgery 2. Agree with bone marrow biopsy - in addition to malignancy w/u would also check AFB culture, aerobic/anaerobic culture, CMV PCR, Parvovirus PCR 3. Check AFB blood culture, CMV PCR, Parvovirus PCR now.  4. Continue vancomycin / cefepime for now until we get more info back on BCx/CT scan.  5. May be r/t recent reintroduction of ART vs MAC --> may start empiric tx for this if nothing revealing on scans/etc. 6. Intermittent soft systolic murmur - r/t anemia/IDA?   Madison Madeira, MSN, NP-C [facility] Cell: [phone] Pager: [phone]  10/21/2017  4:09 PM     Active Problems:   Anemia   Nausea & vomiting   Nausea and vomiting   Biliary dyskinesia   . benztropine  1 mg Oral Daily  . bictegravir-emtricitabine-tenofovir AF  1 tablet Oral Daily  . dapsone  100 mg Oral Daily  . enoxaparin (LOVENOX) injection  40 mg Subcutaneous Q24H  . feeding supplement  1 Container Oral TID BM  . filgrastim  480 mcg Subcutaneous Daily  . fluconazole  200 mg Oral Weekly  . folic acid  2 mg Oral Daily  . iopamidol      . multivitamin  15 mL Oral Daily  . OLANZapine  5 mg Oral QHS  . pantoprazole  40 mg Oral BID  . senna-docusate  1 tablet Oral BID  .  sucralfate  1 g Oral TID WC & HS    HPI: [name] is a 41 y.o. female admitted 10/13/2017 with poorly controlled HIV/AIDS s/p laparoscopic cholecystectomy 10/15/17.   We saw a few days prior to her gallbladder surgery and restarted her on ART [facility]) as well as resumed her dapsone and weekly azithromycin for OI prophylaxisis ith her CD4 count of 10. Since we saw her 5 days ago she has had persistent diffuse abdominal pain, no nausea or diarrhea but persistently anemic and neutropenic with WBC count 800. Dr. Marin [name] was consulted - she has known sickle cell trait, reticulocyte count essentially zero, iron sat 7%. Has received IV iron and Neuppogen (11/3) with some improvement in her WBC count (800 >> 2000 cells with an ANC of 2100). 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 She had some low grade < 101 degree temperatures until 11/5 PM she had temperatures recorded 102.9. Dr. Marin [name] feels she needs bone marrow biopsy now to evaluate and r/o possibility for intermedullary lymphoma.   Denies cough, SOB, diarrhea, joint pain, back pain, neck pain, headaches, vision changes, sensory changes. She has not had BM x 1.5 weeks, however has been eating very little but this is out of the norm for her. She does endorse increasing abdominal  pain and bloating.  BCx 11/5 NG x 24h  Urine Cx 11/6 > pending  CXR 11/6 > negative  BCx 11/6 > pending   Review of Systems: Review of Systems  Constitutional: Positive for chills and fever.  HENT: Negative for sore throat.   Eyes: Negative for blurred vision.  Respiratory: Negative for cough, sputum production and shortness of breath.   Cardiovascular: Negative for chest pain and orthopnea.  Gastrointestinal: Positive for abdominal pain, constipation, nausea and vomiting. Negative for diarrhea.  Genitourinary: Negative for dysuria and flank pain.  Musculoskeletal: Negative for back pain, joint pain and myalgias.  Skin: Negative for rash.  Neurological:  Negative for weakness and headaches.    Past Medical History:  Diagnosis Date  . Acute psychosis ([name]) 03/10/12   2nd admission in last wk for this  . Angio-edema   . Anxiety   . Asthma    inhaler 2xday  . Bipolar disorder ([place])   . Depression   . GERD (gastroesophageal reflux disease)   . Hepatitis B    /E-chart  . History of blood transfusion   . HIV positive ([place])   . Microcytic anemia    h/o per E-chart  . Noncompliance with medication regimen    /e-chart  . 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 Pyelonephritis    h/o per E-chart  . Shortness of breath dyspnea    due to Asthma  . Thyroid disease   . Urticaria     Social History   Tobacco Use  . Smoking status: Never Smoker  . Smokeless tobacco: Never Used  Substance Use Topics  . Alcohol use: No    Alcohol/week: 1.2 oz    Types: 2 Glasses of wine per week  . Drug use: No    Family History  Problem Relation Age of Onset  . Cancer Mother   . Diabetes Mother   . Diabetes Father   . Heart disease Father   . Diabetes Sister   . Urticaria Sister   . Asthma Son   . Allergic rhinitis Neg Hx   . Eczema Neg Hx   . Immunodeficiency Neg Hx    Allergies  Allergen Reactions  . Other Hives and Itching    Berries, TREE NUTS  . Peanut-Containing Drug Products Hives  . Shellfish Allergy Hives  . Bactrim Itching  . Orange Fruit Itching  . Sulfa Antibiotics Itching    OBJECTIVE: Blood pressure 130/79, pulse 97, temperature (!) 103.1 F (39.5 C), resp. rate 16, height 6' (1.829 m), weight 121 lb 6.4 oz (55.1 kg), last menstrual period 09/29/2017, SpO2 93 %.  Physical Exam  Constitutional: She is oriented to person, place, and time.  Thin appearing [name]   HENT:  Mouth/Throat: Oropharynx is clear and moist.  Eyes: Pupils are equal, round, and reactive to light. No scleral icterus.  Cardiovascular: Tachycardia present.  Murmur heard. Intermittent LUSB  soft systolic murmur   Pulmonary/Chest: Effort normal and breath sounds normal. No respiratory distress. She has no rales.  Abdominal: She exhibits distension. There is tenderness.  Musculoskeletal: Normal range of motion. She exhibits no edema.  Lymphadenopathy:    She has no cervical adenopathy.  Neurological: She is alert and oriented to person, place, and time. No cranial nerve deficit.  Skin: Skin is warm and dry. No rash noted.  Psychiatric: Mood and affect normal.    Lab Results Lab Results  Component Value Date   WBC 2.0 (L) 10/21/2017  HGB 6.9 (LL) 10/21/2017   HCT 21.6 (L) 10/21/2017   MCV 74.7 (L) 10/21/2017   PLT 139 (L) 10/21/2017    Lab Results  Component Value Date   CREATININE 0.90 10/21/2017   BUN 8 10/21/2017   NA 132 (L) 10/21/2017   K 3.2 (L) 10/21/2017   CL 102 10/21/2017   CO2 22 10/21/2017    Lab Results  Component Value Date   ALT 14 10/21/2017   AST 39 10/21/2017   ALKPHOS 309 (H) 10/21/2017   BILITOT 1.3 (H) 10/21/2017     Microbiology: Recent Results (from the past 240 hour(s))  Surgical pcr screen     Status: Abnormal   Collection Time: 10/15/17 10:14 AM  Result Value Ref Range Status   MRSA, PCR NEGATIVE NEGATIVE Final   Staphylococcus aureus POSITIVE (A) NEGATIVE Final    Comment: (NOTE) The Xpert SA Assay (FDA approved for NASAL specimens in patients 85 years of age and older), is one component of a comprehensive surveillance program. It is not intended to diagnose infection nor to guide or monitor treatment.   Culture, blood (routine x 2)     Status: None (Preliminary result)   Collection Time: 10/20/17  9:18 AM  Result Value Ref Range Status   Specimen Description BLOOD LEFT ANTECUBITAL  Final   Special Requests   Final    BOTTLES DRAWN AEROBIC ONLY Blood Culture adequate volume   Culture NO GROWTH 1 DAY  Final   Report Status PENDING  Incomplete  Culture, blood (routine x 2)     Status: None (Preliminary result)    Collection Time: 10/20/17  9:18 AM  Result Value Ref Range Status   Specimen Description BLOOD LEFT ANTECUBITAL  Final   Special Requests   Final    BOTTLES DRAWN AEROBIC ONLY Blood Culture adequate volume   Culture NO GROWTH 1 DAY  Final   Report Status PENDING  Incomplete

## 2017-10-21 NOTE — Progress Notes (Signed)
PT Cancellation Note  Patient Details Name: [name] MRN: [id number] DOB: 01-Oct-1976   Cancelled Treatment:    Reason Eval/Treat Not Completed: Patient not medically ready Pt with fevers, low WBC and low Hgb. Will hold PT today until pt more stable. Will follow.   [name] 10/21/2017, 8:18 AM  Wray Kearns, PT, DPT [phone]

## 2017-10-21 NOTE — Progress Notes (Signed)
Now, [name] is having high temperatures.  I am not sure as to why she is having these temperatures all of a sudden.  She has had cultures taken.  Her hemoglobin is dropped down to 6.9.  I think one possibility is some type of malignancy secondary to the HIV.  This I think now warrants a bone marrow biopsy to be done.  It is not uncommon to see intramedullary lymphoma in patients with poorly controlled HIV.  Her white cell count is 2.  I probably would give her some Neupogen.  She has no localizing signs.  She is only had blood cultures done.  She has not had a urine culture.  There is no chest x-ray.  I probably would do a fever workup.  I probably would consider her for prophylactic antibiotics.  She is immunocompromised promise because of the HIV.  Her vital signs today show temperature of 102.3.  Pulse 106.  Blood pressure 130/71.  Oral exam shows no mucositis.  There is no obvious adenopathy in her neck.  Lungs are clear bilaterally.  Cardiac exam regular rate and rhythm with no murmurs rubs or bruits.  Abdomen is soft.  Bowel sounds are present.  There is no fluid wave.  There is no palpable liver or spleen tip.  Extremities shows no clubbing, cyanosis or edema.  Now, [name] is having temperatures.  It is unfortunate that she has had these temperatures.  One would have to suspect a hospital-acquired infection.  Again, now that she has had these temperatures, with her uncontrolled HIV, I do think that she will need a bone marrow test.  I have not talked to her about this yet.  With a hemoglobin of 6.9, she may need to be transfused.  I will leave that up to the primary service.  She will need another dose of iron.  This has become more complicated now.  [name], MD  Job 5:18

## 2017-10-21 NOTE — Progress Notes (Addendum)
PROGRESS NOTE  [name] ZWC:[id number] DOB: 02/22/1976 DOA: 10/13/2017 PCP: Nolene Ebbs, MD  HPI/Recap of past 24 hours: [name] had a fever overnight with Tmax 103.1. pancultured blood cx x2, urine culture and sputum cx. Chest xray. U/A positive suspect candida UTI. On fluconazole po. starting empiric antibiotics for neutropenia  IV cefepime and IV vancomycin. Contact ID to revisit. Hg dropped. FOBT and 2 U PRBCs to be transfused. Hemonc following possible neupogen administration.  This morning the patient reports pain when she swallows. Also, persistent abd pain,  Assessment/Plan: Active Problems:   Anemia   Nausea & vomiting   Nausea and vomiting   Biliary dyskinesia  Code Status:Full  Family Communication:No family members at bedside  Disposition Plan:Will stay another midnight to continue current management.   Consultants:  GI  Procedures:  None  Antimicrobials: IV azithromycin, po dapsone, po fluconazole, biktarvy   DVT prophylaxis: lovenox 40 mg sq daily and SCDs   Objective: Vitals:   10/20/17 2205 10/20/17 2326 10/21/17 0452 10/21/17 0621  BP:   130/71   Pulse:   (!) 106   Resp:   18   Temp: (!) 102.9 F (39.4 C) (!) 101.9 F (38.8 C) (!) 102.2 F (39 C) (!) 102.3 F (39.1 C)  TempSrc: Oral Oral Oral Oral  SpO2:      Weight:      Height:        Intake/Output Summary (Last 24 hours) at 10/21/2017 0723 Last data filed at 10/21/2017 2353 Gross per 24 hour  Intake 120 ml  Output 500 ml  Net -380 ml   Filed Weights   10/18/17 0531 10/19/17 0500 10/20/17 0547  Weight: 59.2 kg (130 lb 9.6 oz) 57.7 kg (127 lb 3.3 oz) 57.5 kg (126 lb 12.2 oz)    Exam:  General exam:[name], thin built, laying in bed uncomfortable Respiratory system: clearto auscultation, no wheezes, or rhonchi Cardiovascular system:RRR with no murmurs, rubs or gallops Gastrointestinal system:Hypoactive BS, diffusely tender on  palpation. Central nervous system:alert and oriented x3 Extremities: moves all 4 extremities freely Psych:Mood is appropriate for condition and setting   Data Reviewed: CBC: Recent Labs  Lab 10/17/17 0349 10/18/17 0600 10/19/17 1517 10/20/17 0717 10/21/17 0526  WBC 1.1* 0.8* 2.9* 2.8* 2.0*  NEUTROABS  --   --  2.1 2.4 PENDING  HGB 8.2* 8.3* 7.9* 7.3* 6.9*  HCT 25.0* 25.6* 24.5* 22.8* 21.6*  MCV 74.2* 75.3* 74.9* 74.8* 74.7*  PLT 181 158 151 140* 614*   Basic Metabolic Panel: Recent Labs  Lab 10/16/17 0634 10/17/17 0349 10/18/17 0600 10/20/17 0717 10/21/17 0526  NA 135 134* 133* 132* 132*  K 5.0 3.7 3.6 3.9 3.2*  CL 108 107 106 100* 102  CO2 17* 19* 20* 19* 22  GLUCOSE 133* 124* 95 80 99  BUN 8 9 8 9 8   CREATININE 0.97 0.91 0.77 0.91 0.90  CALCIUM 8.3* 8.5* 8.6* 8.5* 8.3*   GFR: Estimated Creatinine Clearance: 75.4 mL/min (by C-G formula based on SCr of 0.9 mg/dL). Liver Function Tests: Recent Labs  Lab 10/20/17 0717 10/21/17 0526  AST 22 39  ALT 12* 14  ALKPHOS 225* 309*  BILITOT 0.9 1.3*  PROT 5.6* 5.4*  ALBUMIN 2.3* 2.3*   No results for input(s): LIPASE, AMYLASE in the last 168 hours. No results for input(s): AMMONIA in the last 168 hours. Coagulation Profile: No results for input(s): INR, PROTIME in the last 168 hours. Cardiac Enzymes: Recent Labs  Lab 10/20/17 1603  TROPONINI <0.03   BNP (last 3 results) No results for input(s): PROBNP in the last 8760 hours. HbA1C: No results for input(s): HGBA1C in the last 72 hours. CBG: No results for input(s): GLUCAP in the last 168 hours. Lipid Profile: No results for input(s): CHOL, HDL, LDLCALC, TRIG, CHOLHDL, LDLDIRECT in the last 72 hours. Thyroid Function Tests: No results for input(s): TSH, T4TOTAL, FREET4, T3FREE, THYROIDAB in the last 72 hours. Anemia Panel: Recent Labs    10/19/17 0501  FERRITIN 1,300*  TIBC 171*  IRON 12*  RETICCTPCT 0.5   Urine analysis:    Component Value  Date/Time   COLORURINE AMBER (A) 10/20/2017 0624   APPEARANCEUR CLOUDY (A) 10/20/2017 0624   LABSPEC 1.015 10/20/2017 0624   PHURINE 6.0 10/20/2017 0624   GLUCOSEU NEGATIVE 10/20/2017 0624   HGBUR MODERATE (A) 10/20/2017 0624   BILIRUBINUR NEGATIVE 10/20/2017 0624   KETONESUR NEGATIVE 10/20/2017 0624   PROTEINUR NEGATIVE 10/20/2017 0624   UROBILINOGEN 0.2 12/07/2014 1441   NITRITE NEGATIVE 10/20/2017 0624   LEUKOCYTESUR LARGE (A) 10/20/2017 0624   Sepsis Labs: @LABRCNTIP (procalcitonin:4,lacticidven:4)  ) Recent Results (from the past 240 hour(s))  Surgical pcr screen     Status: Abnormal   Collection Time: 10/15/17 10:14 AM  Result Value Ref Range Status   MRSA, PCR NEGATIVE NEGATIVE Final   Staphylococcus aureus POSITIVE (A) NEGATIVE Final    Comment: (NOTE) The Xpert SA Assay (FDA approved for NASAL specimens in patients 38 years of age and older), is one component of a comprehensive surveillance program. It is not intended to diagnose infection nor to guide or monitor treatment.       Studies: No results found.  Scheduled Meds: . benztropine  1 mg Oral Daily  . bictegravir-emtricitabine-tenofovir AF  1 tablet Oral Daily  . dapsone  100 mg Oral Daily  . enoxaparin (LOVENOX) injection  40 mg Subcutaneous Q24H  . feeding supplement  1 Container Oral TID BM  . filgrastim  480 mcg Subcutaneous Daily  . fluconazole  200 mg Oral Weekly  . folic acid  2 mg Oral Daily  . multivitamin  15 mL Oral Daily  . OLANZapine  5 mg Oral QHS  . pantoprazole  40 mg Oral BID  . senna-docusate  1 tablet Oral BID  . sucralfate  1 g Oral TID WC & HS    Continuous Infusions: . sodium chloride    . azithromycin Stopped (10/15/17 1906)  . ciprofloxacin       LOS: 7 days   Assessment & Plan:  Sepsis, present on admission with no clear source of infection -R/o abscess -Fever with Tmax 103.2 -Neutropenic fever  Biliary dyskinesias/p cholecystectomy POD #6 - s/p  cholecystectomy 10/31 - Improving - ambulate patient  Chest pain most likely 2/2 to esophagitis -Morphine -EKG -Trop unremarkable -GI cocktail prn  H/o esophageal candidiasis -onpofluconazole -outpatient GI follow up (schooler) -po protonix 40 mg BID  Severe leukopenia most ikely HIV related -improving post neupogen - wbc2.9 from0.8 - Hemoncfollowing; we appreciate recommendations  Microcytic anemia -anemia workup -Hgtrending down 7.6 from8.3 baseline 9 -post 1U prbc -check FOBT -follow CBCin am  HIV/AIDS -ID resumed meds -Patientmissed meds for1 month -continue empiric treatments w azithromycin, dapsone  GERD/mild odynophagia -Protonixpo, simethicone for excess gas, carafate -GI cocktail  Hypokalemia -repleated -BMP am  bipolar -resume home meds   Kayleen Memos, MD [facility] Pager [phone]  If 7PM-7AM, please contact night-coverage [email or web address] Password [email or web address] 10/21/2017, 7:23 AM

## 2017-10-21 NOTE — Progress Notes (Signed)
Pharmacy Antibiotic Note  [name] is a 41 y.o. female admitted on 10/13/2017 with Febrile neutropenia.  Pharmacy has been consulted for vancomycin dosing.  Plan: Vancomycin 1250 mg x 1 dose, followed by Vancomycin 750 mg IV every 12 hours.  Goal trough 15-20 mcg/mL Monitor clinical progress, cultures/sensitivities, renal function, abx plan Vancomycin trough as indicated   Height: 6' (182.9 cm) Weight: 121 lb 6.4 oz (55.1 kg) IBW/kg (Calculated) : 73.1  Temp (24hrs), Avg:102 F (38.9 C), Min:100.9 F (38.3 C), Max:103.1 F (39.5 C)  Recent Labs  Lab 10/16/17 0634 10/17/17 0349 10/18/17 0600 10/19/17 1517 10/20/17 0717 10/21/17 0526  WBC 1.7* 1.1* 0.8* 2.9* 2.8* 2.0*  CREATININE 0.97 0.91 0.77  --  0.91 0.90    Estimated Creatinine Clearance: 72.3 mL/min (by C-G formula based on SCr of 0.9 mg/dL).    Allergies  Allergen Reactions  . Other Hives and Itching    Berries, TREE NUTS  . Peanut-Containing Drug Products Hives  . Shellfish Allergy Hives  . Bactrim Itching  . Orange Fruit Itching  . Sulfa Antibiotics Itching    Antimicrobials this admission: 11/6 cefepime >>  11/6 vancomycin >>   Dose adjustments this admission:  Microbiology results: 11/5 BCx: sent 11/6 UCx: sent   Sputum:   10/31 MRSA PCR: negative (pos for staph aureus)   Thank you for allowing [place] to participate in this patients care.  Jens Som, PharmD Clinical phone for 10/21/2017 from 7a-3:30p: [phone] If after 3:30p, please call main pharmacy at: [phone] 10/21/2017 8:15 AM

## 2017-10-21 NOTE — Progress Notes (Signed)
CRITICAL VALUE ALERT  Critical Value:  HGB 6.9  Date & Time Notied:  10/21/2017 0654  Provider Notified: Hal Hope, MD  Orders Received/Actions taken: Awaiting orders

## 2017-10-21 NOTE — Progress Notes (Signed)
Patient continued to refuse PO medication and stated that she just wanted to be left alone for the night.  Temperature rechecked and was 101.22F.  On-call MD Hal Hope notified. No new orders.  Will continue to monitor and notify as needed.

## 2017-10-22 DIAGNOSIS — B370 Candidal stomatitis: Secondary | ICD-10-CM

## 2017-10-22 DIAGNOSIS — Z9049 Acquired absence of other specified parts of digestive tract: Secondary | ICD-10-CM

## 2017-10-22 DIAGNOSIS — D709 Neutropenia, unspecified: Secondary | ICD-10-CM

## 2017-10-22 DIAGNOSIS — R5081 Fever presenting with conditions classified elsewhere: Secondary | ICD-10-CM

## 2017-10-22 DIAGNOSIS — R109 Unspecified abdominal pain: Secondary | ICD-10-CM

## 2017-10-22 LAB — BASIC METABOLIC PANEL
Anion gap: 7 (ref 5–15)
Anion gap: 6 (ref 5–15)
BUN: 8 mg/dL (ref 6–20)
BUN: 9 mg/dL (ref 6–20)
CO2: 23 mmol/L (ref 22–32)
CO2: 23 mmol/L (ref 22–32)
CREATININE: 0.96 mg/dL (ref 0.44–1.00)
Calcium: 7.9 mg/dL — ABNORMAL LOW (ref 8.9–10.3)
Calcium: 8.2 mg/dL — ABNORMAL LOW (ref 8.9–10.3)
Chloride: 102 mmol/L (ref 101–111)
Chloride: 105 mmol/L (ref 101–111)
Creatinine, Ser: 0.97 mg/dL (ref 0.44–1.00)
GFR calc Af Amer: >60 mL/min (ref >60)
Glucose, Bld: 114 mg/dL — ABNORMAL HIGH (ref 65–99)
Glucose, Bld: 106 mg/dL — ABNORMAL HIGH (ref 65–99)
POTASSIUM: 3.3 mmol/L — AB (ref 3.5–5.1)
POTASSIUM: 3.2 mmol/L — AB (ref 3.5–5.1)
SODIUM: 132 mmol/L — AB (ref 135–145)
SODIUM: 134 mmol/L — AB (ref 135–145)

## 2017-10-22 LAB — CBC
HEMATOCRIT: 21.9 % — AB (ref 36.0–46.0)
Hemoglobin: 7 g/dL — ABNORMAL LOW (ref 12.0–15.0)
MCH: 23.3 pg — ABNORMAL LOW (ref 26.0–34.0)
MCHC: 31.5 g/dL (ref 30.0–36.0)
MCV: 74 fL — AB (ref 78.0–100.0)
Platelets: 131 10*3/uL — ABNORMAL LOW (ref 150–400)
RBC: 2.96 MIL/uL — ABNORMAL LOW (ref 3.87–5.11)
RDW: 22.2 % — AB (ref 11.5–15.5)
WBC: 1.4 10*3/uL — CL (ref 4.0–10.5)

## 2017-10-22 LAB — CBC WITH DIFFERENTIAL/PLATELET
BAND NEUTROPHILS: 21 %
BASOS PCT: 0 %
BLASTS: 0 %
Basophils Absolute: 0 10*3/uL (ref 0.0–0.1)
Eosinophils Absolute: 0 10*3/uL (ref 0.0–0.7)
Eosinophils Relative: 0 %
HCT: 22.1 % — ABNORMAL LOW (ref 36.0–46.0)
HEMOGLOBIN: 6.9 g/dL — AB (ref 12.0–15.0)
Lymphocytes Relative: 14 %
Lymphs Abs: 0.2 10*3/uL — ABNORMAL LOW (ref 0.7–4.0)
MCH: 23.2 pg — ABNORMAL LOW (ref 26.0–34.0)
MCHC: 31.2 g/dL (ref 30.0–36.0)
MCV: 74.2 fL — ABNORMAL LOW (ref 78.0–100.0)
MONO ABS: 0.1 10*3/uL (ref 0.1–1.0)
MYELOCYTES: 0 %
Metamyelocytes Relative: 3 %
Monocytes Relative: 5 %
NEUTROS PCT: 57 %
Neutro Abs: 0.9 10*3/uL — ABNORMAL LOW (ref 1.7–7.7)
Other: 0 %
PLATELETS: 125 10*3/uL — AB (ref 150–400)
PROMYELOCYTES ABS: 0 %
RBC: 2.98 MIL/uL — ABNORMAL LOW (ref 3.87–5.11)
RDW: 22.1 % — ABNORMAL HIGH (ref 11.5–15.5)
WBC: 1.2 10*3/uL — CL (ref 4.0–10.5)
nRBC: 0 /100 WBC

## 2017-10-22 LAB — URINE CULTURE

## 2017-10-22 LAB — HEMOGLOBIN AND HEMATOCRIT, BLOOD
HCT: 33.5 % — ABNORMAL LOW (ref 36.0–46.0)
Hemoglobin: 11 g/dL — ABNORMAL LOW (ref 12.0–15.0)

## 2017-10-22 MED ORDER — ACETAMINOPHEN 10 MG/ML IV SOLN
1000.0000 mg | Freq: Once | INTRAVENOUS | Status: AC
  Administered 2017-10-22: 1000 mg via INTRAVENOUS
  Filled 2017-10-22: qty 100

## 2017-10-22 MED ORDER — FLUCONAZOLE 100 MG PO TABS
200.0000 mg | ORAL_TABLET | Freq: Every day | ORAL | Status: DC
  Administered 2017-10-24: 200 mg via ORAL
  Filled 2017-10-22 (×2): qty 2

## 2017-10-22 MED ORDER — TBO-FILGRASTIM 480 MCG/0.8ML ~~LOC~~ SOSY
480.0000 ug | PREFILLED_SYRINGE | Freq: Every day | SUBCUTANEOUS | Status: DC
  Administered 2017-10-22 – 2017-10-23 (×2): 480 ug via SUBCUTANEOUS
  Filled 2017-10-22 (×4): qty 0.8

## 2017-10-22 MED ORDER — ENOXAPARIN SODIUM 40 MG/0.4ML ~~LOC~~ SOLN
40.0000 mg | SUBCUTANEOUS | Status: DC
  Filled 2017-10-22 (×2): qty 0.4

## 2017-10-22 NOTE — Progress Notes (Signed)
[facility]  Date of Admission:  10/13/2017     Total days of antibiotics 2             Cefepime 10/21/2017              Dapsone/Azithromycin OI proph 10/31  Vancomycin 10/21/2017 >> 10/22/17             Brief ID:  [name] POD 7 lap chole with advanced HIV/AIDS (CD4 10, VL 4.4 million) with fevers, low WBC count and anemia. Recently placed back on ART (Biktarvy)    Active Problems:   Anemia   Nausea & vomiting   Nausea and vomiting   Biliary dyskinesia   . benztropine  1 mg Oral Daily  . bictegravir-emtricitabine-tenofovir AF  1 tablet Oral Daily  . dapsone  100 mg Oral Daily  . enoxaparin (LOVENOX) injection  40 mg Subcutaneous Q24H  . feeding supplement  1 Container Oral TID BM  . fluconazole  200 mg Oral Daily  . folic acid  2 mg Oral Daily  . multivitamin  15 mL Oral Daily  . OLANZapine  5 mg Oral QHS  . pantoprazole  40 mg Oral BID  . senna-docusate  1 tablet Oral BID  . sucralfate  1 g Oral TID WC & HS  . Tbo-Filgrastim  480 mcg Subcutaneous Daily    SUBJECTIVE: Persistent fevers ~100.5 - 101 overnight. ANC 900, WBC 1.2, Hgb 6.9. Still feeling lousy with abdominal pain/nausea. No new symptoms to report.   CT C/A/P 10/21/17 >>  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 Multiple tiny low-attenuation foci throughout the spleen noted. Nonspecific. New from 10/16/2016  Enlarged periaortic lymph nodes in upper abdomen with borderline enlarged mediastinal and right hilar nodes  Esophagus appears normal  Small ascites in pelvis  Review of Systems: Review of Systems  All other systems reviewed and are negative.   Allergies  Allergen Reactions  . Other Hives and Itching    Berries, TREE NUTS  . Peanut-Containing Drug Products Hives  . Shellfish Allergy Hives  . Bactrim Itching  . Orange Fruit Itching  . Sulfa Antibiotics Itching    OBJECTIVE: Vitals:   10/21/17 2127 10/22/17 0454 10/22/17 0641  10/22/17 1001  BP: 129/72 127/74    Pulse: 96 97    Resp: 18 18    Temp: (!) 102.3 F (39.1 C) (!) 101.1 F (38.4 C)  (!) 100.9 F (38.3 C)  TempSrc: Oral Oral    SpO2: 95% 100%    Weight:   117 lb 14.4 oz (53.5 kg)   Height:       Body mass index is 15.99 kg/m.  Physical Exam  Constitutional: She is oriented to person, place, and time.  HENT:  Mouth/Throat: Oropharyngeal exudate present.  Eyes: No scleral icterus.  Neck: No thyromegaly present.  Cardiovascular: Normal rate, regular rhythm and normal heart sounds.  Pulmonary/Chest: Effort normal and breath sounds normal. No respiratory distress. She has no rales.  Abdominal: Soft. There is tenderness.  Musculoskeletal: Normal range of motion. She exhibits no edema.  Lymphadenopathy:    She has no cervical adenopathy.  Neurological: She is alert and oriented to person, place, and time. No cranial nerve deficit.  Skin: Skin is warm and dry.  Psychiatric: Affect normal.    Lab Results Serum CMV PCR 11/7 >> pending Human Parvovirus 11/7 >> pending  Lab Results  Component  Value Date   WBC 1.2 (LL) 10/22/2017   HGB 6.9 (LL) 10/22/2017   HCT 22.1 (L) 10/22/2017   MCV 74.2 (L) 10/22/2017   PLT 125 (L) 10/22/2017    Lab Results  Component Value Date   CREATININE 0.96 10/22/2017   BUN 9 10/22/2017   NA 134 (L) 10/22/2017   K 3.2 (L) 10/22/2017   CL 105 10/22/2017   CO2 23 10/22/2017    Lab Results  Component Value Date   ALT 14 10/21/2017   AST 39 10/21/2017   ALKPHOS 309 (H) 10/21/2017   BILITOT 1.3 (H) 10/21/2017     Microbiology: Recent Results (from the past 240 hour(s))  Surgical pcr screen     Status: Abnormal   Collection Time: 10/15/17 10:14 AM  Result Value Ref Range Status   MRSA, PCR NEGATIVE NEGATIVE Final   Staphylococcus aureus POSITIVE (A) NEGATIVE Final    Comment: (NOTE) The Xpert SA Assay (FDA approved for NASAL specimens in patients 37 years of age and older), is one component of a  comprehensive surveillance program. It is not intended to diagnose infection nor to guide or monitor treatment.   Culture, blood (routine x 2)     Status: None (Preliminary result)   Collection Time: 10/20/17  9:18 AM  Result Value Ref Range Status   Specimen Description BLOOD LEFT ANTECUBITAL  Final   Special Requests   Final    BOTTLES DRAWN AEROBIC ONLY Blood Culture adequate volume   Culture NO GROWTH 1 DAY  Final   Report Status PENDING  Incomplete  Culture, blood (routine x 2)     Status: None (Preliminary result)   Collection Time: 10/20/17  9:18 AM  Result Value Ref Range Status   Specimen Description BLOOD LEFT ANTECUBITAL  Final   Special Requests   Final    BOTTLES DRAWN AEROBIC ONLY Blood Culture adequate volume   Culture NO GROWTH 1 DAY  Final   Report Status PENDING  Incomplete  Urine Culture     Status: Abnormal   Collection Time: 10/21/17  6:24 AM  Result Value Ref Range Status   Specimen Description URINE, CLEAN CATCH  Final   Special Requests Immunocompromised  Final   Culture <10,000 COLONIES/mL INSIGNIFICANT GROWTH (A)  Final   Report Status 10/22/2017 FINAL  Final    PLAN: 1. HIV/AIDS: Continue Biktarvy, weekly Azithromycin and Dapsone for OI prophylaxis. CD4 pre surgery only 10 with extremely high viral load.   2. Fevers / Neutropenia: Dr. Marin Olp following and appreciate guidance. Scheduled for BM Bx 11/8 @ 10:00. CT scan with splenomegally and some upper abdominal/mediastinal lymph nodes that are enlarged.   For Bone Marrow Sample - Would check CMV PCR, Parvovirus PCR, Fungal/Aerobic/Anaerobic and AFB cultures in addition to Dr. Antonieta Pert non-infectious work up.   Could still be reactive related to immune reconstitution  Will D/C Vancomycin for now with negative nasal MRSA PCR and no identified source from CT scan and preliminary negative BCx.   Continue Cefepime until more info from cultures.   AFB blood cultures were not drawn and d/c'd by lab. They  will be drawn now after speaking with them about this.   Janene Madeira, MSN, NP-C [facility] [place] Cell: [phone] Pager: [phone]  10/22/2017  10:57 AM

## 2017-10-22 NOTE — Progress Notes (Signed)
PT Cancellation Note  Patient Details Name: [name] MRN: [id number] DOB: [birth year]   Cancelled Treatment:    Reason Eval/Treat Not Completed: Patient not medically ready. Pt with fever, lethargic, and WBC of 1.4, HGB at 7. MD and pt requested to return tomorrow to re-attempt. PT to return as able to complete eval.   [name] M [name] 10/22/2017, 11:30 AM   [name], PT, DPT Pager #: [phone] Office #: [phone]

## 2017-10-22 NOTE — Progress Notes (Signed)
Critical WBC received and on call provider notified. No new orders received. Pt continues to be febrile and no new orders received. Continues on IV abx and is without any adverse reactions noted at this time. Will continue to monitor.

## 2017-10-22 NOTE — Consult Note (Signed)
Chief Complaint: Patient was seen in consultation today for HIV/AIDS  Referring Physician(s): Dr. Marin Olp  Supervising Physician: Marybelle Killings  Patient Status: [facility] - In-pt  History of Present Illness: [name] is a 41 y.o. female with past medical history of poorly controlled HIV who underwent recent lap chole now with fevers, low WBC count, and anemia. Primary team investigating source of fevers.   CT Abdomen Pelvis today showed: 1. Splenomegaly. Multiple small diffuse low attenuation foci throughout the spleen identified. Nonspecific. 2. Enlarged periaortic lymph nodes identified within the upper abdomen. 3. Borderline enlarged mediastinal and right hilar nodes. 4. Small volume of ascites within the pelvis.  ID and Hematology/Oncology recommending bone marrow biopsy.  IR consulted for procedure as an inpatient.   Past Medical History:  Diagnosis Date  . Acute psychosis ([place]) 03/10/12   2nd admission in last wk for this  . Angio-edema   . Anxiety   . Asthma    inhaler 2xday  . Bipolar disorder ([place])   . Depression   . GERD (gastroesophageal reflux disease)   . Hepatitis B    /E-chart  . History of blood transfusion   . HIV positive ([place])   . Microcytic anemia    h/o per E-chart  . Noncompliance with medication regimen    /e-chart  . 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 Pyelonephritis    h/o per E-chart  . Shortness of breath dyspnea    due to Asthma  . Thyroid disease   . Urticaria     Past Surgical History:  Procedure Laterality Date  . CHOLECYSTECTOMY    . THYROID LOBECTOMY  08/2002   left & isthmectomy; for benign thyroid adenoma/E-chart  . THYROID SURGERY    . TUBAL LIGATION  07/2002   /E-chart    Allergies: Other; Peanut-containing drug products; Shellfish allergy; Bactrim; Orange fruit; and Sulfa antibiotics  Medications: Prior to Admission medications   Medication Sig Start Date  End Date Taking? Authorizing Provider  albuterol (PROAIR HFA) 108 (90 BASE) MCG/ACT inhaler Inhale 2 puffs into the lungs every 4 (four) hours as needed for wheezing or shortness of breath (cough). 11/16/15  Yes Gean Quint, MD  benztropine (COGENTIN) 1 MG tablet Take 1 tablet (1 mg total) by mouth daily. 11/12/16  Yes Michel Bickers, MD  dapsone 100 MG tablet Take 100 mg by mouth daily.   Yes [provider]  fluconazole (DIFLUCAN) 100 MG tablet Take 2 tablets (200 mg total) by mouth once a week. 08/05/17  Yes Charlynne Cousins, MD  OLANZapine (ZYPREXA) 5 MG tablet Take 1 tablet (5 mg total) by mouth at bedtime. 11/12/16  Yes Michel Bickers, MD  ondansetron (ZOFRAN ODT) 8 MG disintegrating tablet Take 1 tablet (8 mg total) by mouth every 8 (eight) hours as needed for nausea or vomiting. 09/03/17  Yes Carlyle Basques, MD  pantoprazole (PROTONIX) 40 MG tablet Take 1 tablet (40 mg total) by mouth daily. 11/12/16  Yes Michel Bickers, MD  sucralfate ([name]) 1 g tablet Take 1 tablet (1 g total) by mouth 4 (four) times daily -  with meals and at bedtime. 04/03/17 10/13/17 Yes Cardama, Grayce Sessions, MD  traMADol (ULTRAM) 50 MG tablet Take 1 tablet (50 mg total) by mouth every 6 (six) hours as needed for moderate pain. 08/05/17  Yes Charlynne Cousins, MD     Family History  Problem Relation Age of Onset  . Cancer Mother   .  Diabetes Mother   . Diabetes Father   . Heart disease Father   . Diabetes Sister   . Urticaria Sister   . Asthma Son   . Allergic rhinitis Neg Hx   . Eczema Neg Hx   . Immunodeficiency Neg Hx     Social History   Socioeconomic History  . Marital status: Single    Spouse name: None  . Number of children: None  . Years of education: None  . Highest education level: None  Social Needs  . Financial resource strain: None  . Food insecurity - worry: None  . Food insecurity - inability: None  . Transportation needs - medical: None  . Transportation needs  - non-medical: None  Occupational History  . None  Tobacco Use  . Smoking status: Never Smoker  . Smokeless tobacco: Never Used  Substance and Sexual Activity  . Alcohol use: No    Alcohol/week: 1.2 oz    Types: 2 Glasses of wine per week  . Drug use: No  . Sexual activity: No    Comment: declined condoms  Other Topics Concern  . None  Social History Narrative  . None    Review of Systems  Constitutional: Positive for fever. Negative for fatigue.  Respiratory: Negative for cough and shortness of breath.   Cardiovascular: Negative for chest pain.  Gastrointestinal: Negative for abdominal pain.  Psychiatric/Behavioral: Negative for behavioral problems and confusion.    Vital Signs: BP 127/74 (BP Location: Left Arm)   Pulse 97   Temp (!) 100.9 F (38.3 C)   Resp 18   Ht 6' (1.829 m)   Wt 117 lb 14.4 oz (53.5 kg)   LMP 09/29/2017 Comment: pt. states not having sex  SpO2 100%   BMI 15.99 kg/m   Physical Exam  Constitutional: She is oriented to person, place, and time. She appears well-developed.  Cardiovascular: Normal rate and regular rhythm.  Pulmonary/Chest: Effort normal and breath sounds normal. No respiratory distress.  Neurological: She is alert and oriented to person, place, and time.  Skin: Skin is warm and dry.  Psychiatric: She has a normal mood and affect. Her behavior is normal.  Nursing note and vitals reviewed.   Imaging: Ct Abdomen Pelvis Wo Contrast  Result Date: 10/13/2017 CLINICAL DATA:  Diffuse abdominal pain. Nausea and vomiting. Symptoms for 18 days. Patient refused oral contrast. EXAM: CT ABDOMEN AND PELVIS WITHOUT CONTRAST TECHNIQUE: Multidetector CT imaging of the abdomen and pelvis was performed following the standard protocol without IV contrast. COMPARISON:  10/16/2016 FINDINGS: The study is severely limited by lack of both intravenous and oral contrast. Lower chest: No acute abnormality.  The left ventricle is dilated. Hepatobiliary: Liver  is within normal limits. Sludge versus stones layering in the gallbladder. Pericholecystic fluid is suggested. Pancreas: Unremarkable Spleen: Unremarkable Adrenals/Urinary Tract: Adrenal glands and kidneys are unremarkable. Bladder is unremarkable. Stomach/Bowel: Stomach and duodenum are grossly within normal limits. There is no disproportionate dilatation of small bowel. Normal appendix in the right lower quadrant. There is no obvious mass in the colon. Vascular/Lymphatic: No evidence of aortic aneurysm. Several small oval densities are seen to the left of the abdominal aorta. Lymph nodes of indeterminate size cannot be excluded. Reproductive: The uterus is grossly within normal limits. Other: There is no free fluid. Musculoskeletal: L5-S1 degenerative disc disease with vacuum is noted. No obvious spinal stenosis. IMPRESSION: Sludge versus stones in the gallbladder. Pericholecystic fluid is suspected. Ultrasound can be performed to further delineate. Chronic changes. Exam  is limited by lack of both intravenous and oral contrast. Electronically Signed   By: Marybelle Killings M.D.   On: 10/13/2017 09:26   Dg Chest 2 View  Result Date: 10/13/2017 CLINICAL DATA:  Chest pain. EXAM: CHEST  2 VIEW COMPARISON:  Radiograph 07/30/2017 FINDINGS: The cardiomediastinal contours are normal. The lungs are clear. Pulmonary vasculature is normal. No consolidation, pleural effusion, or pneumothorax. No acute osseous abnormalities are seen. IMPRESSION: No active cardiopulmonary disease. Electronically Signed   By: Jeb Levering M.D.   On: 10/13/2017 00:11   Ct Chest W Contrast  Result Date: 10/22/2017 CLINICAL DATA:  Fever of unknown origin EXAM: CT CHEST, ABDOMEN, AND PELVIS WITH CONTRAST TECHNIQUE: Multidetector CT imaging of the chest, abdomen and pelvis was performed following the standard protocol during bolus administration of intravenous contrast. CONTRAST:  176m ISOVUE-300 IOPAMIDOL (ISOVUE-300) INJECTION 61%  COMPARISON:  CT chest 07/04/2013 and CT abdomen and pelvis 10/13/2017 FINDINGS: CT CHEST FINDINGS Cardiovascular: Normal heart size.  No pericardial effusion. Mediastinum/Nodes: Normal appearance of the esophagus. The trachea appears patent and is midline. Status post left hemithyroidectomy. Right paratracheal lymph node measures 1 cm, image 23 of series 3. Right hilar node measures 1.2 cm, image 23 of series 3. 1 cm sub- carinal lymph node identified, image 31 of series 3. Lungs/Pleura: No pleural effusion. No pleural effusion. Subpleural scarring identified within the lateral and posterior right base. Musculoskeletal: No aggressive lytic or sclerotic bone lesions. CT ABDOMEN PELVIS FINDINGS Hepatobiliary: No focal liver abnormality. Previous cholecystectomy. No biliary dilatation. Pancreas: Unremarkable. No pancreatic ductal dilatation or surrounding inflammatory changes. Spleen: The spleen measures 11.4 x 5.7 x 13.8 cm (volume = 470 cm^3). Multiple tiny low-attenuation foci throughout the spleen noted. New from 10/16/2016. Adrenals/Urinary Tract: The adrenal glands are normal. Normal appearance of the kidneys. Urinary bladder is normal. Stomach/Bowel: The stomach is normal. Normal appearance of the small bowel loops. No pathologic dilatation of the colon. Vascular/Lymphatic: Normal appearance of the abdominal aorta. Enlarged left periaortic node measures 1.3 cm, image 77 of series 3. Adjacent left periaortic node measures 1.4 cm, image 82 of series 3. No enlarged mediastinal or hilar adenopathy. Reproductive: Uterus and bilateral adnexa are unremarkable. Other: Small volume of ascites identified within the pelvis. Musculoskeletal: Degenerative disc disease identified at L5-S1. IMPRESSION: 1. Splenomegaly. Multiple small diffuse low attenuation foci throughout the spleen identified. Nonspecific. 2. Enlarged periaortic lymph nodes identified within the upper abdomen. 3. Borderline enlarged mediastinal and right  hilar nodes. 4. Small volume of ascites within the pelvis. Electronically Signed   By: TKerby MoorsM.D.   On: 10/22/2017 09:23   Ct Abdomen Pelvis W Contrast  Result Date: 10/22/2017 CLINICAL DATA:  Fever of unknown origin EXAM: CT CHEST, ABDOMEN, AND PELVIS WITH CONTRAST TECHNIQUE: Multidetector CT imaging of the chest, abdomen and pelvis was performed following the standard protocol during bolus administration of intravenous contrast. CONTRAST:  1064mISOVUE-300 IOPAMIDOL (ISOVUE-300) INJECTION 61% COMPARISON:  CT chest 07/04/2013 and CT abdomen and pelvis 10/13/2017 FINDINGS: CT CHEST FINDINGS Cardiovascular: Normal heart size.  No pericardial effusion. Mediastinum/Nodes: Normal appearance of the esophagus. The trachea appears patent and is midline. Status post left hemithyroidectomy. Right paratracheal lymph node measures 1 cm, image 23 of series 3. Right hilar node measures 1.2 cm, image 23 of series 3. 1 cm sub- carinal lymph node identified, image 31 of series 3. Lungs/Pleura: No pleural effusion. No pleural effusion. Subpleural scarring identified within the lateral and posterior right base. Musculoskeletal: No aggressive lytic or  sclerotic bone lesions. CT ABDOMEN PELVIS FINDINGS Hepatobiliary: No focal liver abnormality. Previous cholecystectomy. No biliary dilatation. Pancreas: Unremarkable. No pancreatic ductal dilatation or surrounding inflammatory changes. Spleen: The spleen measures 11.4 x 5.7 x 13.8 cm (volume = 470 cm^3). Multiple tiny low-attenuation foci throughout the spleen noted. New from 10/16/2016. Adrenals/Urinary Tract: The adrenal glands are normal. Normal appearance of the kidneys. Urinary bladder is normal. Stomach/Bowel: The stomach is normal. Normal appearance of the small bowel loops. No pathologic dilatation of the colon. Vascular/Lymphatic: Normal appearance of the abdominal aorta. Enlarged left periaortic node measures 1.3 cm, image 77 of series 3. Adjacent left periaortic  node measures 1.4 cm, image 82 of series 3. No enlarged mediastinal or hilar adenopathy. Reproductive: Uterus and bilateral adnexa are unremarkable. Other: Small volume of ascites identified within the pelvis. Musculoskeletal: Degenerative disc disease identified at L5-S1. IMPRESSION: 1. Splenomegaly. Multiple small diffuse low attenuation foci throughout the spleen identified. Nonspecific. 2. Enlarged periaortic lymph nodes identified within the upper abdomen. 3. Borderline enlarged mediastinal and right hilar nodes. 4. Small volume of ascites within the pelvis. Electronically Signed   By: Kerby Moors M.D.   On: 10/22/2017 09:23   Dg Chest Port 1 View  Result Date: 10/21/2017 CLINICAL DATA:  Fever. EXAM: PORTABLE CHEST 1 VIEW COMPARISON:  Radiographs of October 12, 2017. FINDINGS: The heart size and mediastinal contours are within normal limits. Both lungs are clear. No pneumothorax or pleural effusion is noted. The visualized skeletal structures are unremarkable. IMPRESSION: No acute cardiopulmonary abnormality seen. Electronically Signed   By: Marijo Conception, M.D.   On: 10/21/2017 10:43    Labs:  CBC: Recent Labs    10/20/17 0717 10/21/17 0526 10/22/17 0217 10/22/17 0757  WBC 2.8* 2.0* 1.4* 1.2*  HGB 7.3* 6.9* 7.0* 6.9*  HCT 22.8* 21.6* 21.9* 22.1*  PLT 140* 139* 131* 125*    COAGS: Recent Labs    07/30/17 1914  INR 1.11  APTT 32    BMP: Recent Labs    10/20/17 0717 10/21/17 0526 10/22/17 0217 10/22/17 0757  NA 132* 132* 132* 134*  K 3.9 3.2* 3.3* 3.2*  CL 100* 102 102 105  CO2 19* 22 23 23   GLUCOSE 80 99 114* 106*  BUN 9 8 8 9   CALCIUM 8.5* 8.3* 7.9* 8.2*  CREATININE 0.91 0.90 0.97 0.96  GFRNONAA >60 >60 >60 >60  GFRAA >60 >60 >60 >60    LIVER FUNCTION TESTS: Recent Labs    10/12/17 2341 10/14/17 0633 10/20/17 0717 10/21/17 0526  BILITOT 1.2 1.2 0.9 1.3*  AST 34 30 22 39  ALT 14 13* 12* 14  ALKPHOS 221* 165* 225* 309*  PROT 7.4 5.4* 5.6* 5.4*    ALBUMIN 3.2* 2.3* 2.3* 2.3*    TUMOR MARKERS: No results for input(s): AFPTM, CEA, CA199, CHROMGRNA in the last 8760 hours.  Assessment and Plan: Advanced HIV/AIDS Patient with past medical history of HIV/AIDS and recent lap chole now with fever of unknown origin.  CT Abdomen today shows no acute findings aside from lymphadenopathy.  IR consulted for bone marrow biopsy at the request of Dr. Marin Olp.  PA discussed procedure with patient who states she feel sick and intolerant to the same things (i.e. Cold foods) as if she has a yeast infection.  She states Candida has been found by endoscopy twice in the past and she has been successfully treated.  Patient agrees to bone marrow biopsy and has been placed on the schedule for tomorrow, however she  does state she would like further work-up for yeast as possible source.  Discussed with Dr. Marin Olp.  Risks and benefits discussed with the patient including, but not limited to bleeding, infection, damage to adjacent structures or low yield requiring additional tests. All of the patient's questions were answered, patient is agreeable to proceed. Consent signed and in chart.  Thank you for this interesting consult.  I greatly enjoyed meeting Bonny I Mitnick and look forward to participating in their care.  A copy of this report was sent to the requesting provider on this date.  Electronically Signed: Docia Barrier, PA 10/22/2017, 10:22 AM   I spent a total of 40 Minutes  in face to face in clinical consultation, greater than 50% of which was counseling/coordinating care for HIV, fevers.

## 2017-10-22 NOTE — Progress Notes (Addendum)
Nutrition Follow-up  DOCUMENTATION CODES:   Severe malnutrition in context of chronic illness, Underweight  INTERVENTION:  Recommend place cortrak tube post pyloric and begin enteral feeds with   Osmolite 1.2 at 65m/hr increase by 10 every 12 hours to goal rate of 658mhr Pro-stat 3018maily, each supplement provides 100 calories and 15 grams of protein  At goal provides 1828 calories, 94 grams of protein,1181m32mee water  Recommend 200mL25me water every 6 hours, provides 1981mL 62m water  Monitor K+, Phos, Mg, patient is at high risk for refeeding NUTRITION DIAGNOSIS:   Severe Malnutrition related to chronic illness as evidenced by energy intake < 75% for > or equal to 1 month, percent weight loss. -ongoing  GOAL:   Patient will meet greater than or equal to 90% of their needs -not meeting  MONITOR:   PO intake, I & O's, Labs, Supplement acceptance, Weight trends  REASON FOR ASSESSMENT:   Malnutrition Screening Tool    ASSESSMENT:   Madison LabarberaMH of HIV, Hepatitis B, Esophageal stenosis, candidiasis,presents with  Chest pain, nausea & vomiting x3 weeks, diarrhea yesterday  S/P cholecystectomy 10/31 Sepsis with no source of infection. Tmax 103.2  Will need bone marrow biopsy per oncology given uncontrolled HIV. To be done tomorrow.  She has not eaten anything today or yesterday per patient. Nauseous, vomiting, lethargic, weak. NFPE appears unchanged. Patient was severely malnourished upon admission, now 8 days with PO intake meeting <50% of her needs recommend post-pyloric cortrak tube placement.   Intake/Output Summary (Last 24 hours) at 10/22/2017 1353 Last data filed at 10/22/2017 1240 Gross per 24 hour  Intake 2393.75 ml  Output 350 ml  Net 2043.75 ml    Meal Completion: 0-20%  Labs reviewed:  Na 134, K 3.2,  Medications reviewed and include:  GI cocktail PRN  Diet Order:  DIET SOFT Room service appropriate? Yes; Fluid consistency:  Thin Diet NPO time specified Except for: Sips with Meds  EDUCATION NEEDS:   Education needs have been addressed  Skin:  Skin Assessment: Reviewed RN Assessment  Last BM:  10/21/2017  Height:   Ht Readings from Last 1 Encounters:  10/15/17 6' (1.829 m)    Weight:   Wt Readings from Last 1 Encounters:  10/22/17 117 lb 14.4 oz (53.5 kg)    Ideal Body Weight:  72.72 kg  BMI:  Body mass index is 15.99 kg/m.  Estimated Nutritional Needs:   Kcal:  1800-2100 calories (35-40 cal/kg)  Protein:  90-106 grams (1.7-2g/kg)  Fluid:  1.8-2.1L   WilliaSatira Anis, MS, RD LDN Inpatient Clinical Dietitian Pager [phone]

## 2017-10-22 NOTE — Progress Notes (Signed)
Ms. Boutwell definitely is not improving.  She still having temperatures.  She was started on Neupogen yesterday.  It has not done much yet.  Her white cell count was 1.4 today.  Cultures have been taken.  She is on broad-spectrum antibiotics.  This is 1 of those situations where she needs to have a bone marrow biopsy done.  I spoke to her today about this.  I explained why I felt a bone marrow biopsy was necessary.  Given that she has poorly controlled HIV, she is at risk for a bone marrow issue, outside of HIV-induced myelodysplasia.  She could have a bone marrow infection.  She could also have lymphoma or other malignancy in the bone marrow.  I know radiology is quite busy.  Hopefully, they will get to the bone marrow on Friday.  She is complaining of abdominal pain.  She may need to have GI see her for an upper endoscopy.  I know she has had Candida in the past.  She is on Diflucan.  She is also on a "GI cocktail", [name], Protonix.  I have her on folic acid.  I cannot imagine that the temperatures are from medications.  Her hemoglobin is 7.  Platelet count 131,000.  Her platelet count potassium is 3.3.  Her creatinine is 0.97.    Her temperature spikes have been up to 103.1.  This morning, her temperature was 101.1.  Her blood pressure was 127/74.  Her oral exam shows some thrush on her tongue.  Again she is on Diflucan.  However, this is only weekly dosing.  I will make this daily dosing.  I cannot find anything on her physical exam this morning that is localizing to explain her fever.  I does find it unusual that she starts to have temperatures.  I AM NOT SURE IN PART EXPLAIN THIS.  I SPENT ABOUT 25 MINUTES WITH HER THIS MORNING.  AGAIN, I EXPLAINED THAT WE ARE GOING TO HAVE TO DO A BONE MARROW BIOPSY ON HER.   [name], MD  Psalms [phone]

## 2017-10-22 NOTE — Consult Note (Signed)
[facility] Consultation Note  Referring Provider: Dr. Aileen Fass [facility]) Primary Care Physician:  Nolene Ebbs, MD Primary Gastroenterologist:  Dr. Wilford Corner  Reason for Consultation:  Abdominal pain  HPI: [name] is a 41 y.o. female advanced AIDS presenting with abdominal pain, neutropenia, fevers.  Imaging worrisome for possible lymphoma, and oncology to work up with bone marrow biopsy.  Patient has had intermittent odynophagia and epigastric pain, multiple prior EGDs, each basically showing distal esophageal stricture, esophagitis, candidiasis; patient relates that her current pain is not dissimilar to the set of symptoms she had necessitating her multiple prior procedures.  No blood in stool.  Can eat soft diet.   Past Medical History:  Diagnosis Date  . Acute psychosis ([place]) 03/10/12   2nd admission in last wk for this  . Angio-edema   . Anxiety   . Asthma    inhaler 2xday  . Bipolar disorder ([place])   . Depression   . GERD (gastroesophageal reflux disease)   . Hepatitis B    /E-chart  . History of blood transfusion   . HIV positive ([place])   . Microcytic anemia    h/o per E-chart  . Noncompliance with medication regimen    /e-chart  . 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 Pyelonephritis    h/o per E-chart  . Shortness of breath dyspnea    due to Asthma  . Thyroid disease   . Urticaria     Past Surgical History:  Procedure Laterality Date  . CHOLECYSTECTOMY    . THYROID LOBECTOMY  08/2002   left & isthmectomy; for benign thyroid adenoma/E-chart  . THYROID SURGERY    . TUBAL LIGATION  07/2002   /E-chart    Prior to Admission medications   Medication Sig Start Date End Date Taking? Authorizing Provider  albuterol (PROAIR HFA) 108 (90 BASE) MCG/ACT inhaler Inhale 2 puffs into the lungs every 4 (four) hours as needed for wheezing or shortness of breath (cough). 11/16/15  Yes Gean Quint, MD   benztropine (COGENTIN) 1 MG tablet Take 1 tablet (1 mg total) by mouth daily. 11/12/16  Yes Michel Bickers, MD  dapsone 100 MG tablet Take 100 mg by mouth daily.   Yes [provider]  fluconazole (DIFLUCAN) 100 MG tablet Take 2 tablets (200 mg total) by mouth once a week. 08/05/17  Yes Charlynne Cousins, MD  OLANZapine (ZYPREXA) 5 MG tablet Take 1 tablet (5 mg total) by mouth at bedtime. 11/12/16  Yes Michel Bickers, MD  ondansetron (ZOFRAN ODT) 8 MG disintegrating tablet Take 1 tablet (8 mg total) by mouth every 8 (eight) hours as needed for nausea or vomiting. 09/03/17  Yes Carlyle Basques, MD  pantoprazole (PROTONIX) 40 MG tablet Take 1 tablet (40 mg total) by mouth daily. 11/12/16  Yes Michel Bickers, MD  sucralfate ([name]) 1 g tablet Take 1 tablet (1 g total) by mouth 4 (four) times daily -  with meals and at bedtime. 04/03/17 10/13/17 Yes Cardama, Grayce Sessions, MD  traMADol (ULTRAM) 50 MG tablet Take 1 tablet (50 mg total) by mouth every 6 (six) hours as needed for moderate pain. 08/05/17  Yes Charlynne Cousins, MD    Current Facility-Administered Medications  Medication Dose Route Frequency Provider Last Rate Last Dose  . 0.9 %  sodium chloride infusion   Intravenous Once Rise Patience, MD      . 0.9 %  sodium chloride infusion   Intravenous Continuous  Irene Pap [place], [place] 75 mL/hr at 10/21/17 2023    . acetaminophen (TYLENOL) tablet 650 mg  650 mg Oral Q6H PRN Jani Gravel, MD   650 mg at 10/19/17 1652   Or  . acetaminophen (TYLENOL) suppository 650 mg  650 mg Rectal Q6H PRN Jani Gravel, MD   650 mg at 10/21/17 1248  . albuterol (PROVENTIL) (2.5 MG/3ML) 0.083% nebulizer solution 2.5 mg  2.5 mg Nebulization Q4H PRN Jani Gravel, MD      . alum & mag hydroxide-simeth (MAALOX/MYLANTA) 200-200-20 MG/5ML suspension 15 mL  15 mL Oral Q6H PRN Eulogio Bear U, DO   15 mL at 10/16/17 1948  . azithromycin (ZITHROMAX) 1,200 mg in dextrose 5 % 250 mL IVPB  1,200 mg Intravenous Weekly  Rondo Callas, NP   Stopped at 10/15/17 1906  . benztropine (COGENTIN) tablet 1 mg  1 mg Oral Daily Vann, Jessica U, DO   1 mg at 10/21/17 0850  . bictegravir-emtricitabine-tenofovir AF (BIKTARVY) 50-200-25 MG per tablet 1 tablet  1 tablet Oral Daily [place] Callas, NP   1 tablet at 10/21/17 2022  . ceFEPIme (MAXIPIME) 2 g in dextrose 5 % 50 mL IVPB  2 g Intravenous Q8H Hall, Carole N, DO   Stopped at 10/22/17 1024  . dapsone tablet 100 mg  100 mg Oral Daily [place] Callas, NP   100 mg at 10/21/17 0844  . enoxaparin (LOVENOX) injection 40 mg  40 mg Subcutaneous Q24H Hall, Carole N, DO      . feeding supplement (BOOST / RESOURCE BREEZE) liquid 1 Container  1 Container Oral TID BM Eulogio Bear U, DO   1 Container at 10/21/17 0933  . fluconazole (DIFLUCAN) tablet 200 mg  200 mg Oral Daily Volanda Napoleon, MD      . folic acid (FOLVITE) tablet 2 mg  2 mg Oral Daily Volanda Napoleon, MD   2 mg at 10/21/17 0843  . gi cocktail (Maalox,Lidocaine,Donnatal)  30 mL Oral TID PRN Irene Pap N, DO   30 mL at 10/21/17 2138  . HYDROcodone-acetaminophen (NORCO/VICODIN) 5-325 MG per tablet 1-2 tablet  1-2 tablet Oral Q6H PRN Vertis Kelch, NP   2 tablet at 10/21/17 2138  . multivitamin liquid 15 mL  15 mL Oral Daily [place] Callas, NP   15 mL at 10/17/17 2109  . OLANZapine (ZYPREXA) tablet 5 mg  5 mg Oral QHS Vann, Jessica U, DO   5 mg at 10/21/17 2022  . ondansetron (ZOFRAN) injection 4 mg  4 mg Intravenous Q6H PRN Jani Gravel, MD   4 mg at 10/21/17 1512  . pantoprazole (PROTONIX) EC tablet 40 mg  40 mg Oral BID Alvira Philips, [place]   40 mg at 10/21/17 2023  . promethazine (PHENERGAN) injection 6.25 mg  6.25 mg Intravenous Q6H PRN Irene Pap N, DO   6.25 mg at 10/22/17 0948  . senna-docusate (Senokot-S) tablet 1 tablet  1 tablet Oral BID Kayleen Memos, DO   1 tablet at 10/21/17 0843  . sucralfate ([name]) 1 GM/10ML suspension 1 g  1 g Oral TID WC & HS Vann, Jessica U, DO   1 g at  10/21/17 2022  . Tbo-Filgrastim (GRANIX) injection 480 mcg  480 mcg Subcutaneous Daily Volanda Napoleon, MD   480 mcg at 10/22/17 0959  . traMADol (ULTRAM) tablet 50 mg  50 mg Oral Q6H PRN Jani Gravel, MD   50 mg at 10/20/17 1904  Allergies as of 10/12/2017 - Review Complete 10/12/2017  Allergen Reaction Noted  . Dapsone Itching and Rash 06/18/2013  . Other Hives and Itching 09/08/2015  . Peanut-containing drug products Hives 11/05/2011  . Shellfish allergy Hives 11/05/2011  . Bactrim Itching 11/05/2011  . Orange fruit Itching 02/20/2012  . Sulfa antibiotics Itching 11/01/2014    Family History  Problem Relation Age of Onset  . Cancer Mother   . Diabetes Mother   . Diabetes Father   . Heart disease Father   . Diabetes Sister   . Urticaria Sister   . Asthma Son   . Allergic rhinitis Neg Hx   . Eczema Neg Hx   . Immunodeficiency Neg Hx     Social History   Socioeconomic History  . Marital status: Single    Spouse name: Not on file  . Number of children: Not on file  . Years of education: Not on file  . Highest education level: Not on file  Social Needs  . Financial resource strain: Not on file  . Food insecurity - worry: Not on file  . Food insecurity - inability: Not on file  . Transportation needs - medical: Not on file  . Transportation needs - non-medical: Not on file  Occupational History  . Not on file  Tobacco Use  . Smoking status: Never Smoker  . Smokeless tobacco: Never Used  Substance and Sexual Activity  . Alcohol use: No    Alcohol/week: 1.2 oz    Types: 2 Glasses of wine per week  . Drug use: No  . Sexual activity: No    Comment: declined condoms  Other Topics Concern  . Not on file  Social History Narrative  . Not on file    Review of Systems: As per HPI, all others negative  Physical Exam: Vital signs in last 24 hours: Temp:  [100.3 F (37.9 C)-103.1 F (39.5 C)] 100.9 F (38.3 C) (11/07 1001) Pulse Rate:  [96-97] 97 (11/07  0454) Resp:  [16-18] 18 (11/07 0454) BP: (127-130)/(72-79) 127/74 (11/07 0454) SpO2:  [93 %-100 %] 100 % (11/07 0454) Weight:  [117 lb 14.4 oz (53.5 kg)] 117 lb 14.4 oz (53.5 kg) (11/07 0641) Last BM Date: 10/21/17 General: Frail, chronically ill-appearing, much older-appearing than stated age, cachectic Head:  Normocephalic and atraumatic. Eyes:  Sclera clear, no icterus.   Conjunctiva pink. Ears:  Normal auditory acuity. Nose:  No deformity, discharge,  or lesions. Mouth:  No deformity or lesions.  Oropharynx pink & moist. Abdomen:  Soft, mild epigastric tenderness. No masses, hepatosplenomegaly or hernias noted. Normal bowel sounds, without guarding, and without rebound.     Msk:  Symmetrical without gross deformities. Normal posture. Pulses:  Normal pulses noted. Extremities:  Without clubbing or edema. Neurologic:  Alert and  oriented x4; diffusely weak, otherwise grossly normal neurologically. Skin:  Intact without significant lesions or rashes. Psych:  Alert and cooperative. Normal mood and affect.   Lab Results: Recent Labs    10/21/17 0526 10/22/17 0217 10/22/17 0757  WBC 2.0* 1.4* 1.2*  HGB 6.9* 7.0* 6.9*  HCT 21.6* 21.9* 22.1*  PLT 139* 131* 125*   BMET Recent Labs    10/21/17 0526 10/22/17 0217 10/22/17 0757  NA 132* 132* 134*  K 3.2* 3.3* 3.2*  CL 102 102 105  CO2 22 23 23   GLUCOSE 99 114* 106*  BUN 8 8 9   CREATININE 0.90 0.97 0.96  CALCIUM 8.3* 7.9* 8.2*   LFT Recent Labs  10/21/17 0526  PROT 5.4*  ALBUMIN 2.3*  AST 39  ALT 14  ALKPHOS 309*  BILITOT 1.3*   PT/INR No results for input(s): LABPROT, INR in the last 72 hours.  Studies/Results: Ct Chest W Contrast  Result Date: 10/22/2017 CLINICAL DATA:  Fever of unknown origin EXAM: CT CHEST, ABDOMEN, AND PELVIS WITH CONTRAST TECHNIQUE: Multidetector CT imaging of the chest, abdomen and pelvis was performed following the standard protocol during bolus administration of intravenous contrast.  CONTRAST:  17m ISOVUE-300 IOPAMIDOL (ISOVUE-300) INJECTION 61% COMPARISON:  CT chest 07/04/2013 and CT abdomen and pelvis 10/13/2017 FINDINGS: CT CHEST FINDINGS Cardiovascular: Normal heart size.  No pericardial effusion. Mediastinum/Nodes: Normal appearance of the esophagus. The trachea appears patent and is midline. Status post left hemithyroidectomy. Right paratracheal lymph node measures 1 cm, image 23 of series 3. Right hilar node measures 1.2 cm, image 23 of series 3. 1 cm sub- carinal lymph node identified, image 31 of series 3. Lungs/Pleura: No pleural effusion. No pleural effusion. Subpleural scarring identified within the lateral and posterior right base. Musculoskeletal: No aggressive lytic or sclerotic bone lesions. CT ABDOMEN PELVIS FINDINGS Hepatobiliary: No focal liver abnormality. Previous cholecystectomy. No biliary dilatation. Pancreas: Unremarkable. No pancreatic ductal dilatation or surrounding inflammatory changes. Spleen: The spleen measures 11.4 x 5.7 x 13.8 cm (volume = 470 cm^3). Multiple tiny low-attenuation foci throughout the spleen noted. New from 10/16/2016. Adrenals/Urinary Tract: The adrenal glands are normal. Normal appearance of the kidneys. Urinary bladder is normal. Stomach/Bowel: The stomach is normal. Normal appearance of the small bowel loops. No pathologic dilatation of the colon. Vascular/Lymphatic: Normal appearance of the abdominal aorta. Enlarged left periaortic node measures 1.3 cm, image 77 of series 3. Adjacent left periaortic node measures 1.4 cm, image 82 of series 3. No enlarged mediastinal or hilar adenopathy. Reproductive: Uterus and bilateral adnexa are unremarkable. Other: Small volume of ascites identified within the pelvis. Musculoskeletal: Degenerative disc disease identified at L5-S1. IMPRESSION: 1. Splenomegaly. Multiple small diffuse low attenuation foci throughout the spleen identified. Nonspecific. 2. Enlarged periaortic lymph nodes identified within  the upper abdomen. 3. Borderline enlarged mediastinal and right hilar nodes. 4. Small volume of ascites within the pelvis. Electronically Signed   By: TKerby MoorsM.D.   On: 10/22/2017 09:23   Ct Abdomen Pelvis W Contrast  Result Date: 10/22/2017 CLINICAL DATA:  Fever of unknown origin EXAM: CT CHEST, ABDOMEN, AND PELVIS WITH CONTRAST TECHNIQUE: Multidetector CT imaging of the chest, abdomen and pelvis was performed following the standard protocol during bolus administration of intravenous contrast. CONTRAST:  1034mISOVUE-300 IOPAMIDOL (ISOVUE-300) INJECTION 61% COMPARISON:  CT chest 07/04/2013 and CT abdomen and pelvis 10/13/2017 FINDINGS: CT CHEST FINDINGS Cardiovascular: Normal heart size.  No pericardial effusion. Mediastinum/Nodes: Normal appearance of the esophagus. The trachea appears patent and is midline. Status post left hemithyroidectomy. Right paratracheal lymph node measures 1 cm, image 23 of series 3. Right hilar node measures 1.2 cm, image 23 of series 3. 1 cm sub- carinal lymph node identified, image 31 of series 3. Lungs/Pleura: No pleural effusion. No pleural effusion. Subpleural scarring identified within the lateral and posterior right base. Musculoskeletal: No aggressive lytic or sclerotic bone lesions. CT ABDOMEN PELVIS FINDINGS Hepatobiliary: No focal liver abnormality. Previous cholecystectomy. No biliary dilatation. Pancreas: Unremarkable. No pancreatic ductal dilatation or surrounding inflammatory changes. Spleen: The spleen measures 11.4 x 5.7 x 13.8 cm (volume = 470 cm^3). Multiple tiny low-attenuation foci throughout the spleen noted. New from 10/16/2016. Adrenals/Urinary Tract: The adrenal glands are normal.  Normal appearance of the kidneys. Urinary bladder is normal. Stomach/Bowel: The stomach is normal. Normal appearance of the small bowel loops. No pathologic dilatation of the colon. Vascular/Lymphatic: Normal appearance of the abdominal aorta. Enlarged left periaortic node  measures 1.3 cm, image 77 of series 3. Adjacent left periaortic node measures 1.4 cm, image 82 of series 3. No enlarged mediastinal or hilar adenopathy. Reproductive: Uterus and bilateral adnexa are unremarkable. Other: Small volume of ascites identified within the pelvis. Musculoskeletal: Degenerative disc disease identified at L5-S1. IMPRESSION: 1. Splenomegaly. Multiple small diffuse low attenuation foci throughout the spleen identified. Nonspecific. 2. Enlarged periaortic lymph nodes identified within the upper abdomen. 3. Borderline enlarged mediastinal and right hilar nodes. 4. Small volume of ascites within the pelvis. Electronically Signed   By: Kerby Moors M.D.   On: 10/22/2017 09:23   Dg Chest Port 1 View  Result Date: 10/21/2017 CLINICAL DATA:  Fever. EXAM: PORTABLE CHEST 1 VIEW COMPARISON:  Radiographs of October 12, 2017. FINDINGS: The heart size and mediastinal contours are within normal limits. Both lungs are clear. No pneumothorax or pleural effusion is noted. The visualized skeletal structures are unremarkable. IMPRESSION: No acute cardiopulmonary abnormality seen. Electronically Signed   By: Marijo Conception, M.D.   On: 10/21/2017 10:43    Impression:  1.  Abdominal pain and odynophagia.  Ongoing intermittent problem for several years, she has had several endoscopies for this, last a few months ago.  Suspect component of candida esophagitis and reflux esophagitis. 2.  GERD with known distal esophageal dilatation, multiple prior dilatations. 3.  HIV, with neutropenic fevers.  Recent cholecystectomy but CT with no biliary ductal dilatation and other than slight ALP elevation other LFTs are normal. 4.  Abnormal imaging, splenomegaly and adenopathy.  Plan:  1.  Given multiple prior EGDs for near-identical symptoms I don't think there is any utility of repeat endoscopy. 2.  Conservative therapy with diflucan, PPI, carafate is unfortunately all we can do regarding her esophagitis and  candidiasis.  She has had multiple prior EGDs for the same reason over the years, and I don't see any utility in repeating another endoscopy.  Additionally, with neutropenia and fevers, EGD would be risk-prohibitive. 3.  Agree with further investigation for possible lymphoma. 4.  Diet soft, slowly advance as tolerated for bone marrow biopsy and work-up. 5.  Case discussed with Dr. [name] of [facility]. 6.  [facility] will follow at a distance.  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   LOS: 8 days   [place] Rios M  10/22/2017, 12:01 PM  Cell [phone] If no answer or after 5 PM call [phone]

## 2017-10-22 NOTE — Progress Notes (Addendum)
PROGRESS NOTE  [name] CVK:[id number] DOB: May 27, 1976 DOA: 10/13/2017 PCP: Nolene Ebbs, MD  HPI/Recap of past 24 hours: [name] was seen and examined at her bedside. She reports painful swallowing. GI physician in the room states she is currently not a candidate for scope due to leukopenia. Patient continues to run fevers despite tylenol rectal Tmax 102.3. IV tylenol given with resolution of fever. Hemonc and ID also following. Patient will have a bone marrow biopsy done on Friday.  Assessment/Plan: Active Problems:   Anemia   Nausea & vomiting   Nausea and vomiting   Biliary dyskinesia   Code Status:Full  Family Communication:No family members at bedside  Disposition Plan:Will stay another midnight to continue current management.   Consultants:  GI  Procedures:  None  Antimicrobials: IV azithromycin, cefepime, po dapsone, po fluconazole, biktarvy   DVT prophylaxis: lovenox 40 mg sq daily andSCDs   Objective: Vitals:   10/21/17 2127 10/22/17 0454 10/22/17 0641 10/22/17 1001  BP: 129/72 127/74    Pulse: 96 97    Resp: 18 18    Temp: (!) 102.3 F (39.1 C) (!) 101.1 F (38.4 C)  (!) 100.9 F (38.3 C)  TempSrc: Oral Oral    SpO2: 95% 100%    Weight:   53.5 kg (117 lb 14.4 oz)   Height:        Intake/Output Summary (Last 24 hours) at 10/22/2017 1104 Last data filed at 10/22/2017 0911 Gross per 24 hour  Intake 2603.75 ml  Output 350 ml  Net 2253.75 ml   Filed Weights   10/20/17 0547 10/21/17 0700 10/22/17 0641  Weight: 57.5 kg (126 lb 12.2 oz) 55.1 kg (121 lb 6.4 oz) 53.5 kg (117 lb 14.4 oz)    Exam:  General exam:[name], thin built, laying in bed uncomfortable due to high fevers Respiratory system: clearto auscultation, no wheezes, or rhonchi Cardiovascular system:RRR with no murmurs, rubs or gallops Gastrointestinal system:Hypoactive BS, ND NT Central nervous system:alert and oriented  x3 Extremities: moves all 4 extremities freely Psych:Mood is appropriate for condition and setting   Data Reviewed: CBC: Recent Labs  Lab 10/19/17 1517 10/20/17 0717 10/21/17 0526 10/22/17 0217 10/22/17 0757  WBC 2.9* 2.8* 2.0* 1.4* 1.2*  NEUTROABS 2.1 2.4 1.6*  --  PENDING  HGB 7.9* 7.3* 6.9* 7.0* 6.9*  HCT 24.5* 22.8* 21.6* 21.9* 22.1*  MCV 74.9* 74.8* 74.7* 74.0* 74.2*  PLT 151 140* 139* 131* 606*   Basic Metabolic Panel: Recent Labs  Lab 10/18/17 0600 10/20/17 0717 10/21/17 0526 10/22/17 0217 10/22/17 0757  NA 133* 132* 132* 132* 134*  K 3.6 3.9 3.2* 3.3* 3.2*  CL 106 100* 102 102 105  CO2 20* 19* 22 23 23   GLUCOSE 95 80 99 114* 106*  BUN 8 9 8 8 9   CREATININE 0.77 0.91 0.90 0.97 0.96  CALCIUM 8.6* 8.5* 8.3* 7.9* 8.2*   GFR: Estimated Creatinine Clearance: 65.8 mL/min (by C-G formula based on SCr of 0.96 mg/dL). Liver Function Tests: Recent Labs  Lab 10/20/17 0717 10/21/17 0526  AST 22 39  ALT 12* 14  ALKPHOS 225* 309*  BILITOT 0.9 1.3*  PROT 5.6* 5.4*  ALBUMIN 2.3* 2.3*   No results for input(s): LIPASE, AMYLASE in the last 168 hours. No results for input(s): AMMONIA in the last 168 hours. Coagulation Profile: No results for input(s): INR, PROTIME in the last 168 hours. Cardiac Enzymes: Recent Labs  Lab 10/20/17 1603  TROPONINI <0.03   BNP (  last 3 results) No results for input(s): PROBNP in the last 8760 hours. HbA1C: No results for input(s): HGBA1C in the last 72 hours. CBG: No results for input(s): GLUCAP in the last 168 hours. Lipid Profile: No results for input(s): CHOL, HDL, LDLCALC, TRIG, CHOLHDL, LDLDIRECT in the last 72 hours. Thyroid Function Tests: No results for input(s): TSH, T4TOTAL, FREET4, T3FREE, THYROIDAB in the last 72 hours. Anemia Panel: No results for input(s): VITAMINB12, FOLATE, FERRITIN, TIBC, IRON, RETICCTPCT in the last 72 hours. Urine analysis:    Component Value Date/Time   COLORURINE AMBER (A) 10/20/2017  0624   APPEARANCEUR CLOUDY (A) 10/20/2017 0624   LABSPEC 1.015 10/20/2017 0624   PHURINE 6.0 10/20/2017 0624   GLUCOSEU NEGATIVE 10/20/2017 0624   HGBUR MODERATE (A) 10/20/2017 0624   BILIRUBINUR NEGATIVE 10/20/2017 0624   KETONESUR NEGATIVE 10/20/2017 0624   PROTEINUR NEGATIVE 10/20/2017 0624   UROBILINOGEN 0.2 12/07/2014 1441   NITRITE NEGATIVE 10/20/2017 0624   LEUKOCYTESUR LARGE (A) 10/20/2017 0624   Sepsis Labs: @LABRCNTIP (procalcitonin:4,lacticidven:4)  ) Recent Results (from the past 240 hour(s))  Surgical pcr screen     Status: Abnormal   Collection Time: 10/15/17 10:14 AM  Result Value Ref Range Status   MRSA, PCR NEGATIVE NEGATIVE Final   Staphylococcus aureus POSITIVE (A) NEGATIVE Final    Comment: (NOTE) The Xpert SA Assay (FDA approved for NASAL specimens in patients 7 years of age and older), is one component of a comprehensive surveillance program. It is not intended to diagnose infection nor to guide or monitor treatment.   Culture, blood (routine x 2)     Status: None (Preliminary result)   Collection Time: 10/20/17  9:18 AM  Result Value Ref Range Status   Specimen Description BLOOD LEFT ANTECUBITAL  Final   Special Requests   Final    BOTTLES DRAWN AEROBIC ONLY Blood Culture adequate volume   Culture NO GROWTH 1 DAY  Final   Report Status PENDING  Incomplete  Culture, blood (routine x 2)     Status: None (Preliminary result)   Collection Time: 10/20/17  9:18 AM  Result Value Ref Range Status   Specimen Description BLOOD LEFT ANTECUBITAL  Final   Special Requests   Final    BOTTLES DRAWN AEROBIC ONLY Blood Culture adequate volume   Culture NO GROWTH 1 DAY  Final   Report Status PENDING  Incomplete  Urine Culture     Status: Abnormal   Collection Time: 10/21/17  6:24 AM  Result Value Ref Range Status   Specimen Description URINE, CLEAN CATCH  Final   Special Requests Immunocompromised  Final   Culture <10,000 COLONIES/mL INSIGNIFICANT GROWTH (A)   Final   Report Status 10/22/2017 FINAL  Final      Studies: Ct Chest W Contrast  Result Date: 10/22/2017 CLINICAL DATA:  Fever of unknown origin EXAM: CT CHEST, ABDOMEN, AND PELVIS WITH CONTRAST TECHNIQUE: Multidetector CT imaging of the chest, abdomen and pelvis was performed following the standard protocol during bolus administration of intravenous contrast. CONTRAST:  145m ISOVUE-300 IOPAMIDOL (ISOVUE-300) INJECTION 61% COMPARISON:  CT chest 07/04/2013 and CT abdomen and pelvis 10/13/2017 FINDINGS: CT CHEST FINDINGS Cardiovascular: Normal heart size.  No pericardial effusion. Mediastinum/Nodes: Normal appearance of the esophagus. The trachea appears patent and is midline. Status post left hemithyroidectomy. Right paratracheal lymph node measures 1 cm, image 23 of series 3. Right hilar node measures 1.2 cm, image 23 of series 3. 1 cm sub- carinal lymph node identified, image 31 of series  3. Lungs/Pleura: No pleural effusion. No pleural effusion. Subpleural scarring identified within the lateral and posterior right base. Musculoskeletal: No aggressive lytic or sclerotic bone lesions. CT ABDOMEN PELVIS FINDINGS Hepatobiliary: No focal liver abnormality. Previous cholecystectomy. No biliary dilatation. Pancreas: Unremarkable. No pancreatic ductal dilatation or surrounding inflammatory changes. Spleen: The spleen measures 11.4 x 5.7 x 13.8 cm (volume = 470 cm^3). Multiple tiny low-attenuation foci throughout the spleen noted. New from 10/16/2016. Adrenals/Urinary Tract: The adrenal glands are normal. Normal appearance of the kidneys. Urinary bladder is normal. Stomach/Bowel: The stomach is normal. Normal appearance of the small bowel loops. No pathologic dilatation of the colon. Vascular/Lymphatic: Normal appearance of the abdominal aorta. Enlarged left periaortic node measures 1.3 cm, image 77 of series 3. Adjacent left periaortic node measures 1.4 cm, image 82 of series 3. No enlarged mediastinal or hilar  adenopathy. Reproductive: Uterus and bilateral adnexa are unremarkable. Other: Small volume of ascites identified within the pelvis. Musculoskeletal: Degenerative disc disease identified at L5-S1. IMPRESSION: 1. Splenomegaly. Multiple small diffuse low attenuation foci throughout the spleen identified. Nonspecific. 2. Enlarged periaortic lymph nodes identified within the upper abdomen. 3. Borderline enlarged mediastinal and right hilar nodes. 4. Small volume of ascites within the pelvis. Electronically Signed   By: Kerby Moors M.D.   On: 10/22/2017 09:23   Ct Abdomen Pelvis W Contrast  Result Date: 10/22/2017 CLINICAL DATA:  Fever of unknown origin EXAM: CT CHEST, ABDOMEN, AND PELVIS WITH CONTRAST TECHNIQUE: Multidetector CT imaging of the chest, abdomen and pelvis was performed following the standard protocol during bolus administration of intravenous contrast. CONTRAST:  121m ISOVUE-300 IOPAMIDOL (ISOVUE-300) INJECTION 61% COMPARISON:  CT chest 07/04/2013 and CT abdomen and pelvis 10/13/2017 FINDINGS: CT CHEST FINDINGS Cardiovascular: Normal heart size.  No pericardial effusion. Mediastinum/Nodes: Normal appearance of the esophagus. The trachea appears patent and is midline. Status post left hemithyroidectomy. Right paratracheal lymph node measures 1 cm, image 23 of series 3. Right hilar node measures 1.2 cm, image 23 of series 3. 1 cm sub- carinal lymph node identified, image 31 of series 3. Lungs/Pleura: No pleural effusion. No pleural effusion. Subpleural scarring identified within the lateral and posterior right base. Musculoskeletal: No aggressive lytic or sclerotic bone lesions. CT ABDOMEN PELVIS FINDINGS Hepatobiliary: No focal liver abnormality. Previous cholecystectomy. No biliary dilatation. Pancreas: Unremarkable. No pancreatic ductal dilatation or surrounding inflammatory changes. Spleen: The spleen measures 11.4 x 5.7 x 13.8 cm (volume = 470 cm^3). Multiple tiny low-attenuation foci throughout  the spleen noted. New from 10/16/2016. Adrenals/Urinary Tract: The adrenal glands are normal. Normal appearance of the kidneys. Urinary bladder is normal. Stomach/Bowel: The stomach is normal. Normal appearance of the small bowel loops. No pathologic dilatation of the colon. Vascular/Lymphatic: Normal appearance of the abdominal aorta. Enlarged left periaortic node measures 1.3 cm, image 77 of series 3. Adjacent left periaortic node measures 1.4 cm, image 82 of series 3. No enlarged mediastinal or hilar adenopathy. Reproductive: Uterus and bilateral adnexa are unremarkable. Other: Small volume of ascites identified within the pelvis. Musculoskeletal: Degenerative disc disease identified at L5-S1. IMPRESSION: 1. Splenomegaly. Multiple small diffuse low attenuation foci throughout the spleen identified. Nonspecific. 2. Enlarged periaortic lymph nodes identified within the upper abdomen. 3. Borderline enlarged mediastinal and right hilar nodes. 4. Small volume of ascites within the pelvis. Electronically Signed   By: TKerby MoorsM.D.   On: 10/22/2017 09:23    Scheduled Meds: . benztropine  1 mg Oral Daily  . bictegravir-emtricitabine-tenofovir AF  1 tablet Oral Daily  .  dapsone  100 mg Oral Daily  . enoxaparin (LOVENOX) injection  40 mg Subcutaneous Q24H  . feeding supplement  1 Container Oral TID BM  . fluconazole  200 mg Oral Daily  . folic acid  2 mg Oral Daily  . multivitamin  15 mL Oral Daily  . OLANZapine  5 mg Oral QHS  . pantoprazole  40 mg Oral BID  . senna-docusate  1 tablet Oral BID  . sucralfate  1 g Oral TID WC & HS  . Tbo-Filgrastim  480 mcg Subcutaneous Daily    Continuous Infusions: . sodium chloride    . sodium chloride 75 mL/hr at 10/21/17 2023  . acetaminophen    . azithromycin Stopped (10/15/17 1906)  . ceFEPime (MAXIPIME) IV Stopped (10/22/17 1024)  . vancomycin 750 mg (10/22/17 1045)     LOS: 8 days   Assessment & Plan:  Sepsis present on admission with no clear  source of infection -R/o abscess -Fever with Tmax 103.2 -Neutropenic fever -CT chest abd and pelvis w contrast revealed borderline enlarged hilar nodes and splenomegaly -will have bone biopsy done on Friday by IR to r/o lymphoma or other causes  Biliary dyskinesias/p cholecystectomy POD #7 - s/p cholecystectomy 10/31 - ambulate patient  Chest pain most likely 2/2 to esophagitis -IV Morphine prn for severe pain, IV toradol for moderate pain and tylenol for mild to moderate pain -EKG, Trop unremarkable -GI cocktail prn  H/o esophageal candidiasis -onpofluconazole -outpatient GI follow up (schooler) -po protonix 40 mg BID  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 - slow response post neupogen - wbc1.4 - Hemoncfollowing; we appreciate recommendations  Microcytic anemia -anemia workup -Hg6.9 baseline 9 -2 U PRBcs to be transfused today -check FOBT -follow CBCin am  HIV/AIDS -ID resumed HIV meds -Patientmissed meds for1 month -Patient educated on compliance -continue empiric treatments w azithromycin, dapsone, fluconazole  GERD/odynophagia -Protonixpo, simethicone for excess gas, carafate, GI cocktail prn -GI declined EGD at this time due to leukopenia  Bipolar -continue olanzapine   Kayleen Memos, MD [facility] Pager [phone]  If 7PM-7AM, please contact night-coverage [email or web address] Password [email or web address] 10/22/2017, 11:04 AM

## 2017-10-23 ENCOUNTER — Inpatient Hospital Stay (HOSPITAL_COMMUNITY): Payer: Medicaid Other

## 2017-10-23 DIAGNOSIS — R197 Diarrhea, unspecified: Secondary | ICD-10-CM

## 2017-10-23 LAB — COMPREHENSIVE METABOLIC PANEL
ALBUMIN: 2.2 g/dL — AB (ref 3.5–5.0)
ALT: 52 U/L (ref 14–54)
ANION GAP: 8 (ref 5–15)
AST: 119 U/L — AB (ref 15–41)
Alkaline Phosphatase: 1081 U/L — ABNORMAL HIGH (ref 38–126)
BUN: 11 mg/dL (ref 6–20)
CHLORIDE: 105 mmol/L (ref 101–111)
CO2: 20 mmol/L — ABNORMAL LOW (ref 22–32)
Calcium: 8.3 mg/dL — ABNORMAL LOW (ref 8.9–10.3)
Creatinine, Ser: 0.9 mg/dL (ref 0.44–1.00)
GFR calc Af Amer: >60 mL/min (ref >60)
GFR calc non Af Amer: >60 mL/min (ref >60)
GLUCOSE: 89 mg/dL (ref 65–99)
POTASSIUM: 3.4 mmol/L — AB (ref 3.5–5.1)
Sodium: 133 mmol/L — ABNORMAL LOW (ref 135–145)
TOTAL PROTEIN: 5.1 g/dL — AB (ref 6.5–8.1)
Total Bilirubin: 3.8 mg/dL — ABNORMAL HIGH (ref 0.3–1.2)

## 2017-10-23 LAB — CBC WITH DIFFERENTIAL/PLATELET
BASOS ABS: 0 10*3/uL (ref 0.0–0.1)
Basophils Relative: 0 %
EOS ABS: 0 10*3/uL (ref 0.0–0.7)
Eosinophils Relative: 0 %
HCT: 28.8 % — ABNORMAL LOW (ref 36.0–46.0)
Hemoglobin: 9.5 g/dL — ABNORMAL LOW (ref 12.0–15.0)
LYMPHS ABS: 0.4 10*3/uL — AB (ref 0.7–4.0)
Lymphocytes Relative: 10 %
MCH: 24.7 pg — ABNORMAL LOW (ref 26.0–34.0)
MCHC: 33 g/dL (ref 30.0–36.0)
MCV: 75 fL — ABNORMAL LOW (ref 78.0–100.0)
MONO ABS: 0.1 10*3/uL (ref 0.1–1.0)
Monocytes Relative: 3 %
NEUTROS PCT: 87 %
Neutro Abs: 3.6 10*3/uL (ref 1.7–7.7)
PLATELETS: 105 10*3/uL — AB (ref 150–400)
RBC: 3.84 MIL/uL — AB (ref 3.87–5.11)
RDW: 19.9 % — AB (ref 11.5–15.5)
WBC Morphology: INCREASED
WBC: 4.1 10*3/uL (ref 4.0–10.5)

## 2017-10-23 LAB — TYPE AND SCREEN
ABO/RH(D): A POS
ANTIBODY SCREEN: NEGATIVE
Unit division: 0
Unit division: 0

## 2017-10-23 LAB — URINE CULTURE: CULTURE: NO GROWTH

## 2017-10-23 LAB — BPAM RBC
BLOOD PRODUCT EXPIRATION DATE: 201811132359
Blood Product Expiration Date: 201811132359
ISSUE DATE / TIME: 201811071207
ISSUE DATE / TIME: 201811071524
UNIT TYPE AND RH: 600
UNIT TYPE AND RH: 600

## 2017-10-23 LAB — LACTIC ACID, PLASMA
LACTIC ACID, VENOUS: 1 mmol/L (ref 0.5–1.9)
Lactic Acid, Venous: 0.9 mmol/L (ref 0.5–1.9)

## 2017-10-23 LAB — HIV-1 RNA, QUALITATIVE, TMA: HIV-1 RNA, Qualitative, TMA: POSITIVE — AB

## 2017-10-23 MED ORDER — KETOROLAC TROMETHAMINE 15 MG/ML IJ SOLN
15.0000 mg | Freq: Four times a day (QID) | INTRAMUSCULAR | Status: AC | PRN
  Administered 2017-10-23 – 2017-10-24 (×2): 15 mg via INTRAVENOUS
  Filled 2017-10-23 (×2): qty 1

## 2017-10-23 MED ORDER — MIDAZOLAM HCL 2 MG/2ML IJ SOLN
INTRAMUSCULAR | Status: AC
  Filled 2017-10-23: qty 6

## 2017-10-23 MED ORDER — KETOROLAC TROMETHAMINE 15 MG/ML IJ SOLN
15.0000 mg | Freq: Once | INTRAMUSCULAR | Status: AC
  Administered 2017-10-23: 15 mg via INTRAVENOUS
  Filled 2017-10-23: qty 1

## 2017-10-23 MED ORDER — FENTANYL CITRATE (PF) 100 MCG/2ML IJ SOLN
INTRAMUSCULAR | Status: AC | PRN
  Administered 2017-10-23 (×3): 50 ug via INTRAVENOUS

## 2017-10-23 MED ORDER — FENTANYL CITRATE (PF) 100 MCG/2ML IJ SOLN
INTRAMUSCULAR | Status: AC
  Filled 2017-10-23: qty 4

## 2017-10-23 MED ORDER — SACCHAROMYCES BOULARDII 250 MG PO CAPS
250.0000 mg | ORAL_CAPSULE | Freq: Two times a day (BID) | ORAL | Status: DC
  Administered 2017-10-24: 250 mg via ORAL
  Filled 2017-10-23: qty 1

## 2017-10-23 MED ORDER — LIDOCAINE HCL 1 % IJ SOLN
INTRAMUSCULAR | Status: AC
  Filled 2017-10-23: qty 20

## 2017-10-23 MED ORDER — MIDAZOLAM HCL 2 MG/2ML IJ SOLN
INTRAMUSCULAR | Status: AC | PRN
  Administered 2017-10-23 (×2): 1 mg via INTRAVENOUS

## 2017-10-23 NOTE — Progress Notes (Signed)
Patient refused to be weighed .  Patient stated she wanted to wait until after 10.  Will report to the day shift nurse

## 2017-10-23 NOTE — Sedation Documentation (Signed)
Patient is resting comfortably. 

## 2017-10-23 NOTE — Sedation Documentation (Addendum)
bandaid upper buttock area intact

## 2017-10-23 NOTE — Evaluation (Signed)
Physical Therapy Evaluation Patient Details Name: [name] MRN: [id number] DOB: 01/04/1976 Today's Date: 10/23/2017   History of Present Illness  pt is 41 yo female admitted with abd pain, sepsis. PMH of HIV, dysphagia, Hep B, psychosis, bipolar d/o, anemia, noncompliance. bone bx  --w/u for lymphoma results pending  Clinical Impression  Pt admitted with above diagnosis. Pt currently with functional limitations due to the deficits listed below (see PT Problem List).  Pt will benefit from skilled PT to increase their independence and safety with mobility to allow discharge to the venue listed below.  Pt is significantly weak and deconditioned, she fatigues rapidly today with very short distance amb (10'/15'); pt is independent at her baseline; will benefit from PT in acute setting     Follow Up Recommendations No PT follow up    Equipment Recommendations  None recommended by PT    Recommendations for Other Services       Precautions / Restrictions Precautions Precautions: Fall Restrictions Weight Bearing Restrictions: No      Mobility  Bed Mobility Overal bed mobility: Modified Independent                Transfers Overall transfer level: Needs assistance Equipment used: None Transfers: Sit to/from Stand Sit to Stand: Min guard         General transfer comment: for safety, unsteady on initial standing ( reports having been OOB very little since admisson)  Ambulation/Gait Ambulation/Gait assistance: Min assist;Min guard Ambulation Distance (Feet): 15 Feet(10') Assistive device: None(or IV pole ) Gait Pattern/deviations: Step-through pattern;Decreased stride length;Narrow base of support     General Gait Details: min to min/guard for safety and balance; pt with one episode of knee buckling but able able to self recover; fatigues rapidly  Stairs            Wheelchair Mobility    Modified Rankin (Stroke Patients Only)       Balance Overall  balance assessment: Needs assistance   Sitting balance-Leahy Scale: Good     Standing balance support: No upper extremity supported;During functional activity Standing balance-Leahy Scale: Fair Standing balance comment: pt performs hygiene with close supervision for safety, no LOB; stands to wash hands, min/guard d/t fatigue                             Pertinent Vitals/Pain Pain Assessment: Faces Faces Pain Scale: Hurts little more Pain Location: stomach Pain Descriptors / Indicators: Discomfort;Grimacing Pain Intervention(s): Monitored during session;Limited activity within patient's tolerance;Patient requesting pain meds-RN notified    Home Living Family/patient expects to be discharged to:: Private residence Living Arrangements: Children(19yo son) Available Help at Discharge: Family Type of Home: House Home Access: Stairs to enter   Technical brewer of Steps: none Home Layout: Two level [place]: None      Prior Function Level of Independence: Independent         Comments: independent but difficulty since Sept per pt; pt reports her Godmother is nearby and will help some at home     Hand Dominance        Extremity/Trunk Assessment   Upper Extremity Assessment Upper Extremity Assessment: Generalized weakness    Lower Extremity Assessment Lower Extremity Assessment: Generalized weakness       Communication   Communication: No difficulties  Cognition Arousal/Alertness: Awake/alert Behavior During Therapy: WFL for tasks assessed/performed Overall Cognitive Status: Within Functional Limits for tasks assessed  General Comments      Exercises     Assessment/Plan    PT Assessment Patient needs continued PT services  PT Problem List Decreased strength;Decreased activity tolerance;Decreased balance;Decreased mobility       PT Treatment Interventions DME instruction;Gait  training;Functional mobility training;Stair training;Therapeutic exercise;Therapeutic activities;Patient/family education    PT Goals (Current goals can be found in the Care Plan section)  Acute Rehab PT Goals Patient Stated Goal: "just to get better" PT Goal Formulation: With patient Time For Goal Achievement: 11/06/17 Potential to Achieve Goals: Good    Frequency Min 3X/week   Barriers to discharge        Co-evaluation               AM-PAC PT "6 Clicks" Daily Activity  Outcome Measure Difficulty turning over in bed (including adjusting bedclothes, sheets and blankets)?: A Little Difficulty moving from lying on back to sitting on the side of the bed? : A Little Difficulty sitting down on and standing up from a chair with arms (e.g., wheelchair, bedside commode, etc,.)?: A Little Help needed moving to and from a bed to chair (including a wheelchair)?: A Little Help needed walking in hospital room?: A Little Help needed climbing 3-5 steps with a railing? : A Lot 6 Click Score: 17    End of Session Equipment Utilized During Treatment: Gait belt Activity Tolerance: Patient limited by fatigue Patient left: in chair;with call bell/phone within reach   PT Visit Diagnosis: Unsteadiness on feet (R26.81);Muscle weakness (generalized) (M62.81)    Time: 4098-1191 PT Time Calculation (min) (ACUTE ONLY): 32 min   Charges:   PT Evaluation $PT Eval Low Complexity: 1 Low PT Treatments $Gait Training: 8-22 mins   PT G CodesKenyon [name], PT Pager: [phone] 10/23/2017   [name] 10/23/2017, 3:04 PM

## 2017-10-23 NOTE — Progress Notes (Signed)
[name] had a little bit of a "accident" this morning.  Looks like she is having some diarrhea.  This might be from the antibiotic.  Her white cell count is up to 4.1.  She responds well to the Neupogen.  Cultures were all negative.  As such, I would stop the Maxipime.  She is still having this abdominal pain.  She is on multiple different agents for her stomach.  I know she has had Candida before.  She is going for her bone marrow test today.  I talked to her about this.  She understands why it needs to be done.  She is agreeable to have it.  I think she had transfused yesterday.  Hemoglobin today is 9.5.  Her platelet count is 105,000.  That probably is going down because of the Neupogen.  She has had no cough.  There is no rashes.  Her temperature seems to be improving.  This might be from the white cell count being better.  Again, cultures were all negative.  Her exam is pretty much non-focal.  Her temperature 99.4.  Pulse 85.  Blood pressure 141/77.  Her lungs are clear.  Cardiac exam regular rate and rhythm.  Abdomen is soft.  She has decent bowel sounds.  There is no fluid wave.  There is no palpable liver or spleen tip.  Back exam shows no tenderness over the spine, ribs or hips.  [name] has the HIV.  She is on HIV medication.  Hopefully she will now understand the importance of taking this medication.  I told her that her blood counts are low because of the HIV and with HIV that is not controlled, she is at high risk for bone marrow cancer.  Hopefully, this will make her understand the need to take her HIV medication as an outpatient.  This diarrhea is probably from the antibiotics.  I do not think this is C. difficile.  However, I think stopping antibiotics would be reasonable.  I would keep on the Neupogen for right now.  [name], MD  Jeneen Rinks 3:17

## 2017-10-23 NOTE — Progress Notes (Addendum)
PROGRESS NOTE  [name] XTK:[id number] DOB: 1976-06-08 DOA: 10/13/2017 PCP: Nolene Ebbs, MD  HPI/Recap of past 24 hours: [name] was seen and examined ather bedside. She reports she feels a lot better. Nausea is still present but improved. Odynophagia is also improved. She denies chest pain, abd pain dyspnea or palpitations. White count is coming up post neupogen 10/22/17. Scheduled for a bone marrow biopsy today.  Assessment/Plan: Active Problems:   Anemia   Nausea & vomiting   Nausea and vomiting   Biliary dyskinesia  Code Status:Full  Family Communication:No family members at bedside  Disposition Plan:Will stay another midnight to continue current management.   Consultants:  GI, hemoncology  Procedures:  None  Antimicrobials: IV azithromycin, cefepime, po dapsone, po fluconazole, biktarvy   DVT prophylaxis: lovenox 40 mg sq daily andSCDs- pt has been refusing her chemical DVT prophylaxis. Educated about the benefits of dvt prophylaxis.   Objective: Vitals:   10/23/17 1039 10/23/17 1043 10/23/17 1121 10/23/17 1448  BP: 127/84 121/82 117/82 126/84  Pulse: 69 63 83 84  Resp: 14 14 16 18   Temp:      TempSrc:      SpO2: 99% 95% 99% 99%  Weight:      Height:        Intake/Output Summary (Last 24 hours) at 10/23/2017 1529 Last data filed at 10/23/2017 0827 Gross per 24 hour  Intake 345 ml  Output -  Net 345 ml   Filed Weights   10/21/17 0700 10/22/17 0641 10/23/17 0500  Weight: 55.1 kg (121 lb 6.4 oz) 53.5 kg (117 lb 14.4 oz) 53.1 kg (117 lb)    Exam:  General exam:[name], thin built, laying in bed in NAD Respiratory system: clearto auscultation, no wheezes, or rhonchi Cardiovascular system:RRR with no murmurs, rubs or gallops Gastrointestinal system:Hypoactive BS, ND NT Central nervous system:alert and oriented x3 Extremities: moves all 4 extremities freely; mild non pitting trace edema in LE  bilaterally Psych:Mood is appropriate for condition and setting   Data Reviewed: CBC: Recent Labs  Lab 10/19/17 1517 10/20/17 0717 10/21/17 0526 10/22/17 0217 10/22/17 0757 10/22/17 2008 10/23/17 0404  WBC 2.9* 2.8* 2.0* 1.4* 1.2*  --  4.1  NEUTROABS 2.1 2.4 1.6*  --  0.9*  --  3.6  HGB 7.9* 7.3* 6.9* 7.0* 6.9* 11.0* 9.5*  HCT 24.5* 22.8* 21.6* 21.9* 22.1* 33.5* 28.8*  MCV 74.9* 74.8* 74.7* 74.0* 74.2*  --  75.0*  PLT 151 140* 139* 131* 125*  --  992*   Basic Metabolic Panel: Recent Labs  Lab 10/20/17 0717 10/21/17 0526 10/22/17 0217 10/22/17 0757 10/23/17 0636  NA 132* 132* 132* 134* 133*  K 3.9 3.2* 3.3* 3.2* 3.4*  CL 100* 102 102 105 105  CO2 19* 22 23 23  20*  GLUCOSE 80 99 114* 106* 89  BUN 9 8 8 9 11   CREATININE 0.91 0.90 0.97 0.96 0.90  CALCIUM 8.5* 8.3* 7.9* 8.2* 8.3*   GFR: Estimated Creatinine Clearance: 69.7 mL/min (by C-G formula based on SCr of 0.9 mg/dL). Liver Function Tests: Recent Labs  Lab 10/20/17 0717 10/21/17 0526 10/23/17 0636  AST 22 39 119*  ALT 12* 14 52  ALKPHOS 225* 309* 1,081*  BILITOT 0.9 1.3* 3.8*  PROT 5.6* 5.4* 5.1*  ALBUMIN 2.3* 2.3* 2.2*   No results for input(s): LIPASE, AMYLASE in the last 168 hours. No results for input(s): AMMONIA in the last 168 hours. Coagulation Profile: No results for input(s): INR, PROTIME  in the last 168 hours. Cardiac Enzymes: Recent Labs  Lab 10/20/17 1603  TROPONINI <0.03   BNP (last 3 results) No results for input(s): PROBNP in the last 8760 hours. HbA1C: No results for input(s): HGBA1C in the last 72 hours. CBG: No results for input(s): GLUCAP in the last 168 hours. Lipid Profile: No results for input(s): CHOL, HDL, LDLCALC, TRIG, CHOLHDL, LDLDIRECT in the last 72 hours. Thyroid Function Tests: No results for input(s): TSH, T4TOTAL, FREET4, T3FREE, THYROIDAB in the last 72 hours. Anemia Panel: No results for input(s): VITAMINB12, FOLATE, FERRITIN, TIBC, IRON, RETICCTPCT in the  last 72 hours. Urine analysis:    Component Value Date/Time   COLORURINE AMBER (A) 10/20/2017 0624   APPEARANCEUR CLOUDY (A) 10/20/2017 0624   LABSPEC 1.015 10/20/2017 0624   PHURINE 6.0 10/20/2017 0624   GLUCOSEU NEGATIVE 10/20/2017 0624   HGBUR MODERATE (A) 10/20/2017 0624   BILIRUBINUR NEGATIVE 10/20/2017 0624   KETONESUR NEGATIVE 10/20/2017 0624   PROTEINUR NEGATIVE 10/20/2017 0624   UROBILINOGEN 0.2 12/07/2014 1441   NITRITE NEGATIVE 10/20/2017 0624   LEUKOCYTESUR LARGE (A) 10/20/2017 0624   Sepsis Labs: @LABRCNTIP (procalcitonin:4,lacticidven:4)  ) Recent Results (from the past 240 hour(s))  Surgical pcr screen     Status: Abnormal   Collection Time: 10/15/17 10:14 AM  Result Value Ref Range Status   MRSA, PCR NEGATIVE NEGATIVE Final   Staphylococcus aureus POSITIVE (A) NEGATIVE Final    Comment: (NOTE) The Xpert SA Assay (FDA approved for NASAL specimens in patients 70 years of age and older), is one component of a comprehensive surveillance program. It is not intended to diagnose infection nor to guide or monitor treatment.   Culture, blood (routine x 2)     Status: None (Preliminary result)   Collection Time: 10/20/17  9:18 AM  Result Value Ref Range Status   Specimen Description BLOOD LEFT ANTECUBITAL  Final   Special Requests   Final    BOTTLES DRAWN AEROBIC ONLY Blood Culture adequate volume   Culture NO GROWTH 3 DAYS  Final   Report Status PENDING  Incomplete  Culture, blood (routine x 2)     Status: None (Preliminary result)   Collection Time: 10/20/17  9:18 AM  Result Value Ref Range Status   Specimen Description BLOOD LEFT ANTECUBITAL  Final   Special Requests   Final    BOTTLES DRAWN AEROBIC ONLY Blood Culture adequate volume   Culture NO GROWTH 3 DAYS  Final   Report Status PENDING  Incomplete  Urine Culture     Status: Abnormal   Collection Time: 10/21/17  6:24 AM  Result Value Ref Range Status   Specimen Description URINE, CLEAN CATCH  Final    Special Requests Immunocompromised  Final   Culture <10,000 COLONIES/mL INSIGNIFICANT GROWTH (A)  Final   Report Status 10/22/2017 FINAL  Final  Culture, blood (x 2)     Status: None (Preliminary result)   Collection Time: 10/21/17  7:19 AM  Result Value Ref Range Status   Specimen Description BLOOD LEFT ARM  Final   Special Requests   Final    BOTTLES DRAWN AEROBIC ONLY Blood Culture adequate volume   Culture NO GROWTH 2 DAYS  Final   Report Status PENDING  Incomplete  Culture, blood (x 2)     Status: None (Preliminary result)   Collection Time: 10/21/17  7:24 AM  Result Value Ref Range Status   Specimen Description BLOOD LEFT HAND  Final   Special Requests   Final  BOTTLES DRAWN AEROBIC ONLY Blood Culture adequate volume   Culture NO GROWTH 2 DAYS  Final   Report Status PENDING  Incomplete  Culture, Urine     Status: None   Collection Time: 10/21/17 10:20 PM  Result Value Ref Range Status   Specimen Description URINE, CLEAN CATCH  Final   Special Requests NONE  Final   Culture NO GROWTH  Final   Report Status 10/23/2017 FINAL  Final  Aerobic/Anaerobic Culture (surgical/deep wound)     Status: None (Preliminary result)   Collection Time: 10/23/17 11:23 AM  Result Value Ref Range Status   Specimen Description BONE MARROW  Final   Special Requests NONE  Final   Gram Stain   Final    MODERATE WBC PRESENT, PREDOMINANTLY MONONUCLEAR NO ORGANISMS SEEN    Culture PENDING  Incomplete   Report Status PENDING  Incomplete      Studies: Ct Bone Marrow Biopsy & Aspiration  Result Date: 10/23/2017 INDICATION: 41 year old female with a history of neutropenia EXAM: CT BONE MARROW BIOPSY AND ASPIRATION MEDICATIONS: None. ANESTHESIA/SEDATION: Moderate (conscious) sedation was employed during this procedure. A total of Versed 2.0 mg and Fentanyl 150 mcg was administered intravenously. Moderate Sedation Time: 12 minutes. The patient's level of consciousness and vital signs were monitored  continuously by radiology nursing throughout the procedure under my direct supervision. FLUOROSCOPY TIME:  CT COMPLICATIONS: None PROCEDURE: The procedure risks, benefits, and alternatives were explained to the patient. Questions regarding the procedure were encouraged and answered. The patient understands and consents to the procedure. Scout CT of the pelvis was performed for surgical planning purposes. The posterior pelvis was prepped with Betadinein a sterile fashion, and a sterile drape was applied covering the operative field. A sterile gown and sterile gloves were used for the procedure. Local anesthesia was provided with 1% Lidocaine. We targeted the left posterior iliac bone for biopsy. The skin and subcutaneous tissues were infiltrated with 1% lidocaine without epinephrine. A small stab incision was made with an 11 blade scalpel, and an 11 gauge Murphy needle was advanced with CT guidance to the posterior cortex. Manual forced was used to advance the needle through the posterior cortex and the stylet was removed. A bone marrow aspirate was retrieved and passed to a cytotechnologist in the room. The Murphy needle was then advanced without the stylet for a core biopsy. The core biopsy was retrieved and also passed to a cytotechnologist. Second core was achieved for culture purpose. Manual pressure was used for hemostasis and a sterile dressing was placed. No complications were encountered no significant blood loss was encountered. Patient tolerated the procedure well and remained hemodynamically stable throughout. IMPRESSION: Status post CT-guided bone marrow biopsy, with tissue specimen sent to pathology for complete histopathologic analysis Signed, Dulcy Fanny. Earleen Newport, DO [facility] Radiology Electronically Signed   By: Corrie Mckusick D.O.   On: 10/23/2017 12:14    Scheduled Meds: . benztropine  1 mg Oral Daily  . bictegravir-emtricitabine-tenofovir AF  1  tablet Oral Daily  . dapsone  100 mg Oral Daily  . enoxaparin (LOVENOX) injection  40 mg Subcutaneous Q24H  . feeding supplement  1 Container Oral TID BM  . fentaNYL      . fluconazole  200 mg Oral Daily  . folic acid  2 mg Oral Daily  . lidocaine      . midazolam      . multivitamin  15 mL Oral Daily  . OLANZapine  5  mg Oral QHS  . pantoprazole  40 mg Oral BID  . saccharomyces boulardii  250 mg Oral BID  . senna-docusate  1 tablet Oral BID  . sucralfate  1 g Oral TID WC & HS  . Tbo-Filgrastim  480 mcg Subcutaneous Daily    Continuous Infusions: . sodium chloride    . sodium chloride 75 mL/hr at 10/23/17 1305     LOS: 9 days   Assessment & Plan:  Sepsis, present on admission suspect 2/2 to HIV infection -improving afebrile overnight -U/A revealed candida. Urine culture no growth -CT chest abd and pelvis w contrast revealed borderline enlarged hilar nodes and splenomegaly -will have bone biopsy done today by IR to r/o lymphoma or other causes  Biliary dyskinesias/p cholecystectomy POD #8 - s/p cholecystectomy 10/31 - pt encouraged to ambulate but she is reluctant  Chest pain most likely 2/2 to esophagitis -resolved -IV Morphine prn for severe pain, IV toradol for moderate pain and tylenol for mild to moderate pain -EKG, Trop unremarkable -GI cocktail prn  H/o esophageal candidiasis -onpofluconazole x 14 days stop date 11/04/17 -outpatient GI follow up (schooler) -po protonix 40 mg BID  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 - post neupogen treatments x2 - wbc4 from 1.4 -bone marrow planned today by IR - Hemoncfollowing; we appreciate recommendations  Microcytic anemia -post 2U PRBCs; Hg 9.5 from 6.9; baseline hg 9.0 -2 U PRBcs to be transfused today -check FOBT -follow CBCin am  HIV/AIDS -ID resumed HIV meds -Patientmissed meds for1 month -Patient educated on compliance; she understands and agrees to plan -continue empiric treatments w  azithromycin, dapsone, fluconazole  GERD/odynophagia -Protonixpo, simethicone for excess gas, carafate, GI cocktail prn -GI declined EGD at this time due to leukopenia  Bipolar -continue olanzapine   Kayleen Memos, MD [facility] Pager [phone]  If 7PM-7AM, please contact night-coverage [email or web address] Password [email or web address] 10/23/2017, 3:29 PM

## 2017-10-23 NOTE — Progress Notes (Signed)
Patient stated she is having back pain of 10/10.  50 Mg of Ultram has been given to the patient an hour ago but patient stated no relief.  Patient is requesting for another dose of Tordol to be administered.  Informed the patient that was a one time dose and has been discontinued.  Will notify the triad on call regarding the request.  Will continue to monitor the patient.

## 2017-10-23 NOTE — Progress Notes (Signed)
[name] for Infectious Disease  Date of Admission:  10/13/2017     Total days of antibiotics - Stopped              Cefepime 10/21/2017 >> 11/8             Dapsone/Azithromycin OI proph 10/31  Vancomycin 10/21/2017 >> 10/22/17             Brief ID:  [name] POD 8 lap chole with advanced HIV/AIDS (CD4 10, VL 4.4 million) with fevers, low WBC count and anemia. Recently placed back on ART (Biktarvy).    Active Problems:   Anemia   Nausea & vomiting   Nausea and vomiting   Biliary dyskinesia   . benztropine  1 mg Oral Daily  . bictegravir-emtricitabine-tenofovir AF  1 tablet Oral Daily  . dapsone  100 mg Oral Daily  . enoxaparin (LOVENOX) injection  40 mg Subcutaneous Q24H  . feeding supplement  1 Container Oral TID BM  . fluconazole  200 mg Oral Daily  . folic acid  2 mg Oral Daily  . lidocaine      . multivitamin  15 mL Oral Daily  . OLANZapine  5 mg Oral QHS  . pantoprazole  40 mg Oral BID  . saccharomyces boulardii  250 mg Oral BID  . senna-docusate  1 tablet Oral BID  . sucralfate  1 g Oral TID WC & HS  . Tbo-Filgrastim  480 mcg Subcutaneous Daily    SUBJECTIVE: No further fevers - temp ~99 deg. WBC up to 4.1. Due for IR bone marrow biopsy today. Still with abdominal pain and difficulty swallowing.   Review of Systems: Review of Systems  All other systems reviewed and are negative.   Allergies  Allergen Reactions  . Other Hives and Itching    Berries, TREE NUTS  . Peanut-Containing Drug Products Hives  . Shellfish Allergy Hives  . Bactrim Itching  . Orange Fruit Itching  . Sulfa Antibiotics Itching    OBJECTIVE: Vitals:   10/22/17 2217 10/23/17 0459 10/23/17 0500 10/23/17 0915  BP: 124/71 (!) 141/77  140/70  Pulse: 80 85  80  Resp: 18 18  18   Temp: 99.6 F (37.6 C) 99.4 F (37.4 C)    TempSrc: Oral Oral    SpO2: 97% 93%  97%  Weight:   117 lb (53.1 kg)   Height:       Body mass index is 15.87 kg/m.  Physical Exam    Constitutional: She is oriented to person, place, and time.  HENT:  Mouth/Throat: Oropharyngeal exudate present.  Eyes: No scleral icterus.  Neck: No thyromegaly present.  Cardiovascular: Normal rate, regular rhythm and normal heart sounds.  Pulmonary/Chest: Effort normal and breath sounds normal. No respiratory distress. She has no rales.  Abdominal: Soft. There is tenderness.  Musculoskeletal: Normal range of motion. She exhibits no edema.  Lymphadenopathy:    She has no cervical adenopathy.  Neurological: She is alert and oriented to person, place, and time. No cranial nerve deficit.  Skin: Skin is warm and dry.  Psychiatric: Affect normal.  Vitals reviewed.   Lab Results Serum CMV PCR 11/7 >> pending Human Parvovirus 11/7 >> pending  Lab Results  Component Value Date   WBC 4.1 10/23/2017   HGB 9.5 (L) 10/23/2017   HCT 28.8 (L) 10/23/2017   MCV 75.0 (L) 10/23/2017   PLT 105 (L) 10/23/2017    Lab Results  Component  Value Date   CREATININE 0.90 10/23/2017   BUN 11 10/23/2017   NA 133 (L) 10/23/2017   K 3.4 (L) 10/23/2017   CL 105 10/23/2017   CO2 20 (L) 10/23/2017    Lab Results  Component Value Date   ALT 52 10/23/2017   AST 119 (H) 10/23/2017   ALKPHOS 1,081 (H) 10/23/2017   BILITOT 3.8 (H) 10/23/2017     Microbiology: Recent Results (from the past 240 hour(s))  Surgical pcr screen     Status: Abnormal   Collection Time: 10/15/17 10:14 AM  Result Value Ref Range Status   MRSA, PCR NEGATIVE NEGATIVE Final   Staphylococcus aureus POSITIVE (A) NEGATIVE Final    Comment: (NOTE) The Xpert SA Assay (FDA approved for NASAL specimens in patients 67 years of age and older), is one component of a comprehensive surveillance program. It is not intended to diagnose infection nor to guide or monitor treatment.   Culture, blood (routine x 2)     Status: None (Preliminary result)   Collection Time: 10/20/17  9:18 AM  Result Value Ref Range Status   Specimen  Description BLOOD LEFT ANTECUBITAL  Final   Special Requests   Final    BOTTLES DRAWN AEROBIC ONLY Blood Culture adequate volume   Culture NO GROWTH 2 DAYS  Final   Report Status PENDING  Incomplete  Culture, blood (routine x 2)     Status: None (Preliminary result)   Collection Time: 10/20/17  9:18 AM  Result Value Ref Range Status   Specimen Description BLOOD LEFT ANTECUBITAL  Final   Special Requests   Final    BOTTLES DRAWN AEROBIC ONLY Blood Culture adequate volume   Culture NO GROWTH 2 DAYS  Final   Report Status PENDING  Incomplete  Urine Culture     Status: Abnormal   Collection Time: 10/21/17  6:24 AM  Result Value Ref Range Status   Specimen Description URINE, CLEAN CATCH  Final   Special Requests Immunocompromised  Final   Culture <10,000 COLONIES/mL INSIGNIFICANT GROWTH (A)  Final   Report Status 10/22/2017 FINAL  Final  Culture, blood (x 2)     Status: None (Preliminary result)   Collection Time: 10/21/17  7:19 AM  Result Value Ref Range Status   Specimen Description BLOOD LEFT ARM  Final   Special Requests   Final    BOTTLES DRAWN AEROBIC ONLY Blood Culture adequate volume   Culture NO GROWTH 1 DAY  Final   Report Status PENDING  Incomplete  Culture, blood (x 2)     Status: None (Preliminary result)   Collection Time: 10/21/17  7:24 AM  Result Value Ref Range Status   Specimen Description BLOOD LEFT HAND  Final   Special Requests   Final    BOTTLES DRAWN AEROBIC ONLY Blood Culture adequate volume   Culture NO GROWTH 1 DAY  Final   Report Status PENDING  Incomplete   IMAGING:  CT C/A/P 10/21/17 >>  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 Multiple tiny low-attenuation foci throughout the spleen noted. Nonspecific. New from 10/16/2016  Enlarged periaortic lymph nodes in upper abdomen with borderline enlarged mediastinal and right hilar nodes  Esophagus appears normal  Small ascites in pelvis   ASSESSMENT & PLAN: 1. HIV/AIDS: CD4 pre  surgery only 10 with extremely high viral load.   Continue Biktarvy and Dapsone   Stop weekly azithromycin while we await AFB studies   Will arrange earlier follow up  in clinic than previously planned as she will need frequent lab monitoring   2. Abdominal Pain/painful swallowing: Has had this issue for some time now and has had scopes with [facility] previously. Splenomegaly contributing? GI has seen - no EGD being she is neutropenic and has had symptoms c/w this in the past.   Continue carafate / protonix / GI cocktail per primary team.   Need to separate carafate/magnesium/calcium and Biktarvy by at least 2 hours  Still suspicious for MAC - await AFB cultures   Alk Phos elevated 309 >> 1081, AST 39 >> 119, TBili up to 3.2 --> GI recommended possible MRCP to evaluate CBD with risk for AIDS cholangiopathy and need for possible stenting.   3. Malnutrition: RD recommending cortrak placement for enteral nutrition.   Defer to primary team - would certainly be helpful to get her the nutrition she needs, however uncertain if she would be agreeable to this.   4. Fevers / Neutropenia: Dr. Marin Olp following and appreciate guidance. CT scan with splenomegally and some upper abdominal/mediastinal lymph nodes that are enlarged. Getting Neupogen per Onc.     For Bone Marrow Sample - Would check EBV PCR, CMV PCR, Parvovirus PCR, Fungal/Aerobic/Anaerobic and AFB cultures in addition to Dr. Antonieta Pert non-infectious work up.   Could still be reactive related to immune reconstitution  Agree with stopping Cefepime  Janene Madeira, MSN, NP-C [facility] for Infectious [place] Cell: [phone] Pager: [phone]  10/23/2017  10:20 AM

## 2017-10-23 NOTE — Procedures (Signed)
Interventional Radiology Procedure Note  Procedure: CT guided aspirate and core biopsy of left posterior iliac bone Complications: None Recommendations: - Bedrest supine x 1 hrs - OTC's PRN  Pain - Follow biopsy results  Signed,  Leyli Kevorkian S. Quiera Diffee, DO    

## 2017-10-24 DIAGNOSIS — M549 Dorsalgia, unspecified: Secondary | ICD-10-CM

## 2017-10-24 DIAGNOSIS — Z9889 Other specified postprocedural states: Secondary | ICD-10-CM

## 2017-10-24 LAB — CBC WITH DIFFERENTIAL/PLATELET
BASOS PCT: 0 %
Basophils Absolute: 0 10*3/uL (ref 0.0–0.1)
EOS ABS: 0.1 10*3/uL (ref 0.0–0.7)
Eosinophils Relative: 1 %
HCT: 29.1 % — ABNORMAL LOW (ref 36.0–46.0)
Hemoglobin: 9.5 g/dL — ABNORMAL LOW (ref 12.0–15.0)
LYMPHS ABS: 0.4 10*3/uL — AB (ref 0.7–4.0)
Lymphocytes Relative: 7 %
MCH: 24.7 pg — AB (ref 26.0–34.0)
MCHC: 32.6 g/dL (ref 30.0–36.0)
MCV: 75.8 fL — AB (ref 78.0–100.0)
MONO ABS: 0.1 10*3/uL (ref 0.1–1.0)
Monocytes Relative: 2 %
NEUTROS ABS: 4.4 10*3/uL (ref 1.7–7.7)
Neutrophils Relative %: 90 %
PLATELETS: 99 10*3/uL — AB (ref 150–400)
RBC: 3.84 MIL/uL — ABNORMAL LOW (ref 3.87–5.11)
RDW: 20.6 % — AB (ref 11.5–15.5)
WBC: 5 10*3/uL (ref 4.0–10.5)

## 2017-10-24 LAB — BASIC METABOLIC PANEL
Anion gap: 7 (ref 5–15)
BUN: 13 mg/dL (ref 6–20)
CALCIUM: 8.3 mg/dL — AB (ref 8.9–10.3)
CO2: 18 mmol/L — ABNORMAL LOW (ref 22–32)
Chloride: 106 mmol/L (ref 101–111)
Creatinine, Ser: 0.95 mg/dL (ref 0.44–1.00)
GFR calc Af Amer: >60 mL/min (ref >60)
GLUCOSE: 98 mg/dL (ref 65–99)
Potassium: 3.3 mmol/L — ABNORMAL LOW (ref 3.5–5.1)
Sodium: 131 mmol/L — ABNORMAL LOW (ref 135–145)

## 2017-10-24 LAB — ACID FAST SMEAR (AFB, MYCOBACTERIA)

## 2017-10-24 LAB — ACID FAST SMEAR (AFB): ACID FAST SMEAR - AFSCU2: NEGATIVE

## 2017-10-24 LAB — HUMAN PARVOVIRUS DNA DETECTION BY PCR: Parvovirus B19, PCR: NEGATIVE

## 2017-10-24 LAB — CMV DNA, QUANTITATIVE, PCR
CMV DNA QUANT: NEGATIVE [IU]/mL
Log10 CMV Qn DNA Pl: UNDETERMINED log10 IU/mL

## 2017-10-24 MED ORDER — BICTEGRAVIR-EMTRICITAB-TENOFOV 50-200-25 MG PO TABS: 1.0000 | ORAL_TABLET | Freq: Every day | ORAL | 0 refills | Status: AC

## 2017-10-24 MED ORDER — FLUCONAZOLE 200 MG PO TABS: 200.0000 mg | ORAL_TABLET | Freq: Every day | ORAL | 0 refills | Status: AC

## 2017-10-24 MED ORDER — POTASSIUM CHLORIDE CRYS ER 20 MEQ PO TBCR
40.0000 meq | EXTENDED_RELEASE_TABLET | Freq: Once | ORAL | Status: DC
  Filled 2017-10-24: qty 2

## 2017-10-24 NOTE — Discharge Summary (Signed)
Discharge Summary  [name] GBE:[id number] DOB: October 26, 1976  PCP: Nolene Ebbs, MD  Admit date: 10/13/2017 Discharge date: 10/24/2017  Time spent: 25 minutes  Recommendations for Outpatient Follow-up:  1. Follow with Infectious Disease within a wek 2. Follow with hemoncology within a week 3. Follow with your PCP within a week  Discharge Diagnoses:  Active Hospital Problems   Diagnosis Date Noted  . Biliary dyskinesia 10/16/2017  . Nausea and vomiting 10/14/2017  . Anemia 10/13/2017  . Nausea & vomiting 10/13/2017    Resolved Hospital Problems  No resolved problems to display.    Discharge Condition: Stable  Diet recommendation: Resume previous diet   Vitals:   10/24/17 0556 10/24/17 1313  BP: 115/79 137/81  Pulse: (!) 110 79  Resp: 17 20  Temp: 98.9 F (37.2 C) (!) 100.4 F (38 C)  SpO2: 95% 98%    History of present illness:  [name]  is a 41 y.o. female, w HIV, non compliance with her HIV medications, Hepatitis B, Gerd, Esophageal stenosis , Esophageal candidiasis presents w chest pain.  "sharp pain"  Started 3 weeks ago.  Fairly constant.  + nausea and some vomitting.  Some diarrhea starting yesterday.  "loose stool", slight epigastric discomfort as well over the past 3 week,  Denies fever, chills, constipation, hematochezia, black stool.   Pt has not been taking her pantoprazole for the past 1 week. Has not been taking her HIV medications for 1 month.  ED requested admission for evaluation of chest pain, as well as epigastric pain and n/v, diarrhea.   Cholecystitis s/p cholecystectomy POD # 9.   During her stay in the hospital, patient became severely leukopenic with wbc as low as 0.8. Hemeoncology consulted and neupogen started with improvement of white count. Had high fever and sepsis, was promptly started on broad spectrum antibiotics in addition to her prophylactic medications for HIV.  Due to her persistent fever, hemonc decided to do a bone  marrow biopsy to rule out a HIV related malignancy. Results may take up to 1 week to return from 10/23/17.  On the day of discherge the patient was hemodynamically stable and was fever free for at least 36 hours.  Hospital Course:  Active Problems:   Anemia   Nausea & vomiting   Nausea and vomiting   Biliary dyskinesia  Sepsis, present on admission suspect 2/2 to HIV infection -secondary to advanced HIV in the setting of non compliance with HIV medications -U/A revealed candida. Urine culture no growth. Asymptomatic. -CT chest abd and pelvis w contrast revealed borderline enlarged hilar nodes and splenomegaly -bone biopsy done today by IR to r/o lymphoma or other causes  Biliary dyskinesias/p cholecystectomy POD #8 - s/p cholecystectomy 10/31 - pt encouraged to ambulate  Chest pain most likely 2/2 to esophagitis -resolved -EKG,Trop unremarkable  H/o esophageal candidiasis -onpofluconazole x 14 days stop date 11/04/17 -outpatient GI follow up (schooler) -po protonix 40 mg BID  Severe leukopeniamostlikely HIV related - resolved -post neupogen treatments x2 - wbc5 from as low as 0.8 - bone marrow 10/23/17 - Hemoncfollowing, Dr. Marin Olp, [place]  Microcytic anemia -stable -post 2U PRBCs; Hg 9.5 from 6.9; baseline hg 9.0 -2 U PRBcs transfused  HIV/AIDS -ID resumedHIVmeds -Patientmissed meds for1 month -Patient educated on compliance; she understands and agrees to plan -continue empiric treatments w dapsone, fluconazole  GERD/odynophagia -stable  Bipolar -continue olanzapine    Procedures: Cholecystectomy 10/15/17 Bone marrow biopsy 10/23/17  Consultations:  General surgery  Heme oncology  GI  Discharge Exam: BP 137/81 (BP Location: Left Arm)   Pulse 79   Temp (!) 100.4 F (38 C) (Oral)   Resp 20   Ht 6' (1.829 m)   Wt 55.8 kg (123 lb 1.6 oz)   LMP 09/29/2017 Comment: pt. states not having sex  SpO2 98%   BMI 16.70 kg/m    General: 41 year old pleasant female thin built, NAD Cardiovascular: RRR with no murmurs rubs or gallops Respiratory: CTA with no wheezes, rales or rhonchi   Discharge Instructions You were cared for by a hospitalist during your hospital stay. If you have any questions about your discharge medications or the care you received while you were in the hospital after you are discharged, you can call the unit and asked to speak with the hospitalist on call if the hospitalist that took care of you is not available. Once you are discharged, your primary care physician will handle any further medical issues. Please note that NO REFILLS for any discharge medications will be authorized once you are discharged, as it is imperative that you return to your primary care physician (or establish a relationship with a primary care physician if you do not have one) for your aftercare needs so that they can reassess your need for medications and monitor your lab values.   Allergies as of 10/24/2017      Reactions   Other Hives, Itching   Berries, TREE NUTS   Peanut-containing Drug Products Hives   Shellfish Allergy Hives   Bactrim Itching   Orange Fruit Itching   Sulfa Antibiotics Itching      Medication List    TAKE these medications   albuterol 108 (90 Base) MCG/ACT inhaler Commonly known as:  PROAIR HFA Inhale 2 puffs into the lungs every 4 (four) hours as needed for wheezing or shortness of breath (cough).   benztropine 1 MG tablet Commonly known as:  COGENTIN Take 1 tablet (1 mg total) by mouth daily.   bictegravir-emtricitabine-tenofovir AF 50-200-25 MG Tabs tablet Commonly known as:  BIKTARVY Take 1 tablet daily by mouth.   dapsone 100 MG tablet Take 100 mg by mouth daily.   fluconazole 200 MG tablet Commonly known as:  DIFLUCAN Take 1 tablet (200 mg total) daily by mouth. Start taking on:  10/25/2017 What changed:    medication strength  when to take this   OLANZapine 5 MG  tablet Commonly known as:  ZYPREXA Take 1 tablet (5 mg total) by mouth at bedtime.   ondansetron 8 MG disintegrating tablet Commonly known as:  ZOFRAN ODT Take 1 tablet (8 mg total) by mouth every 8 (eight) hours as needed for nausea or vomiting.   pantoprazole 40 MG tablet Commonly known as:  PROTONIX Take 1 tablet (40 mg total) by mouth daily.   sucralfate 1 g tablet Commonly known as:  [name] Take 1 tablet (1 g total) by mouth 4 (four) times daily -  with meals and at bedtime.   traMADol 50 MG tablet Commonly known as:  ULTRAM Take 1 tablet (50 mg total) by mouth every 6 (six) hours as needed for moderate pain.      Allergies  Allergen Reactions  . Other Hives and Itching    Berries, TREE NUTS  . Peanut-Containing Drug Products Hives  . Shellfish Allergy Hives  . Bactrim Itching  . Orange Fruit Itching  . Sulfa Antibiotics Itching   Follow-up Information    [facility], [facility] Follow up on 10/30/2017.  Specialty:  General Surgery Why:  Your appointment is at 2 PM.  Be at the office 30 minutes early for check in.  Bring photo ID and insurance information.   Contact information: [address] [place] [address] [phone]        [facility]             . Go on 11/05/2017.   Why:  Follow up appointment with Colletta Maryland, NP at 3:30 pm  Contact information: [address] [place] [address]           The results of significant diagnostics from this hospitalization (including imaging, microbiology, ancillary and laboratory) are listed below for reference.    Significant Diagnostic Studies: Ct Abdomen Pelvis Wo Contrast  Result Date: 10/13/2017 CLINICAL DATA:  Diffuse abdominal pain. Nausea and vomiting. Symptoms for 18 days. Patient refused oral contrast. EXAM: CT ABDOMEN AND PELVIS WITHOUT CONTRAST TECHNIQUE: Multidetector CT imaging of the abdomen and pelvis was performed  following the standard protocol without IV contrast. COMPARISON:  10/16/2016 FINDINGS: The study is severely limited by lack of both intravenous and oral contrast. Lower chest: No acute abnormality.  The left ventricle is dilated. Hepatobiliary: Liver is within normal limits. Sludge versus stones layering in the gallbladder. Pericholecystic fluid is suggested. Pancreas: Unremarkable Spleen: Unremarkable Adrenals/Urinary Tract: Adrenal glands and kidneys are unremarkable. Bladder is unremarkable. Stomach/Bowel: Stomach and duodenum are grossly within normal limits. There is no disproportionate dilatation of small bowel. Normal appendix in the right lower quadrant. There is no obvious mass in the colon. Vascular/Lymphatic: No evidence of aortic aneurysm. Several small oval densities are seen to the left of the abdominal aorta. Lymph nodes of indeterminate size cannot be excluded. Reproductive: The uterus is grossly within normal limits. Other: There is no free fluid. Musculoskeletal: L5-S1 degenerative disc disease with vacuum is noted. No obvious spinal stenosis. IMPRESSION: Sludge versus stones in the gallbladder. Pericholecystic fluid is suspected. Ultrasound can be performed to further delineate. Chronic changes. Exam is limited by lack of both intravenous and oral contrast. Electronically Signed   By: Marybelle Killings M.D.   On: 10/13/2017 09:26   Dg Chest 2 View  Result Date: 10/13/2017 CLINICAL DATA:  Chest pain. EXAM: CHEST  2 VIEW COMPARISON:  Radiograph 07/30/2017 FINDINGS: The cardiomediastinal contours are normal. The lungs are clear. Pulmonary vasculature is normal. No consolidation, pleural effusion, or pneumothorax. No acute osseous abnormalities are seen. IMPRESSION: No active cardiopulmonary disease. Electronically Signed   By: Jeb Levering M.D.   On: 10/13/2017 00:11   Ct Chest W Contrast  Result Date: 10/22/2017 CLINICAL DATA:  Fever of unknown origin EXAM: CT CHEST, ABDOMEN, AND PELVIS  WITH CONTRAST TECHNIQUE: Multidetector CT imaging of the chest, abdomen and pelvis was performed following the standard protocol during bolus administration of intravenous contrast. CONTRAST:  149m ISOVUE-300 IOPAMIDOL (ISOVUE-300) INJECTION 61% COMPARISON:  CT chest 07/04/2013 and CT abdomen and pelvis 10/13/2017 FINDINGS: CT CHEST FINDINGS Cardiovascular: Normal heart size.  No pericardial effusion. Mediastinum/Nodes: Normal appearance of the esophagus. The trachea appears patent and is midline. Status post left hemithyroidectomy. Right paratracheal lymph node measures 1 cm, image 23 of series 3. Right hilar node measures 1.2 cm, image 23 of series 3. 1 cm sub- carinal lymph node identified, image 31 of series 3. Lungs/Pleura: No pleural effusion. No pleural effusion. Subpleural scarring identified within the lateral and posterior right base. Musculoskeletal: No aggressive lytic or sclerotic bone  lesions. CT ABDOMEN PELVIS FINDINGS Hepatobiliary: No focal liver abnormality. Previous cholecystectomy. No biliary dilatation. Pancreas: Unremarkable. No pancreatic ductal dilatation or surrounding inflammatory changes. Spleen: The spleen measures 11.4 x 5.7 x 13.8 cm (volume = 470 cm^3). Multiple tiny low-attenuation foci throughout the spleen noted. New from 10/16/2016. Adrenals/Urinary Tract: The adrenal glands are normal. Normal appearance of the kidneys. Urinary bladder is normal. Stomach/Bowel: The stomach is normal. Normal appearance of the small bowel loops. No pathologic dilatation of the colon. Vascular/Lymphatic: Normal appearance of the abdominal aorta. Enlarged left periaortic node measures 1.3 cm, image 77 of series 3. Adjacent left periaortic node measures 1.4 cm, image 82 of series 3. No enlarged mediastinal or hilar adenopathy. Reproductive: Uterus and bilateral adnexa are unremarkable. Other: Small volume of ascites identified within the pelvis. Musculoskeletal: Degenerative disc disease identified at  L5-S1. IMPRESSION: 1. Splenomegaly. Multiple small diffuse low attenuation foci throughout the spleen identified. Nonspecific. 2. Enlarged periaortic lymph nodes identified within the upper abdomen. 3. Borderline enlarged mediastinal and right hilar nodes. 4. Small volume of ascites within the pelvis. Electronically Signed   By: Kerby Moors M.D.   On: 10/22/2017 09:23   Ct Abdomen Pelvis W Contrast  Result Date: 10/22/2017 CLINICAL DATA:  Fever of unknown origin EXAM: CT CHEST, ABDOMEN, AND PELVIS WITH CONTRAST TECHNIQUE: Multidetector CT imaging of the chest, abdomen and pelvis was performed following the standard protocol during bolus administration of intravenous contrast. CONTRAST:  166m ISOVUE-300 IOPAMIDOL (ISOVUE-300) INJECTION 61% COMPARISON:  CT chest 07/04/2013 and CT abdomen and pelvis 10/13/2017 FINDINGS: CT CHEST FINDINGS Cardiovascular: Normal heart size.  No pericardial effusion. Mediastinum/Nodes: Normal appearance of the esophagus. The trachea appears patent and is midline. Status post left hemithyroidectomy. Right paratracheal lymph node measures 1 cm, image 23 of series 3. Right hilar node measures 1.2 cm, image 23 of series 3. 1 cm sub- carinal lymph node identified, image 31 of series 3. Lungs/Pleura: No pleural effusion. No pleural effusion. Subpleural scarring identified within the lateral and posterior right base. Musculoskeletal: No aggressive lytic or sclerotic bone lesions. CT ABDOMEN PELVIS FINDINGS Hepatobiliary: No focal liver abnormality. Previous cholecystectomy. No biliary dilatation. Pancreas: Unremarkable. No pancreatic ductal dilatation or surrounding inflammatory changes. Spleen: The spleen measures 11.4 x 5.7 x 13.8 cm (volume = 470 cm^3). Multiple tiny low-attenuation foci throughout the spleen noted. New from 10/16/2016. Adrenals/Urinary Tract: The adrenal glands are normal. Normal appearance of the kidneys. Urinary bladder is normal. Stomach/Bowel: The stomach is  normal. Normal appearance of the small bowel loops. No pathologic dilatation of the colon. Vascular/Lymphatic: Normal appearance of the abdominal aorta. Enlarged left periaortic node measures 1.3 cm, image 77 of series 3. Adjacent left periaortic node measures 1.4 cm, image 82 of series 3. No enlarged mediastinal or hilar adenopathy. Reproductive: Uterus and bilateral adnexa are unremarkable. Other: Small volume of ascites identified within the pelvis. Musculoskeletal: Degenerative disc disease identified at L5-S1. IMPRESSION: 1. Splenomegaly. Multiple small diffuse low attenuation foci throughout the spleen identified. Nonspecific. 2. Enlarged periaortic lymph nodes identified within the upper abdomen. 3. Borderline enlarged mediastinal and right hilar nodes. 4. Small volume of ascites within the pelvis. Electronically Signed   By: TKerby MoorsM.D.   On: 10/22/2017 09:23   Dg Chest Port 1 View  Result Date: 10/21/2017 CLINICAL DATA:  Fever. EXAM: PORTABLE CHEST 1 VIEW COMPARISON:  Radiographs of October 12, 2017. FINDINGS: The heart size and mediastinal contours are within normal limits. Both lungs are clear. No pneumothorax or pleural effusion  is noted. The visualized skeletal structures are unremarkable. IMPRESSION: No acute cardiopulmonary abnormality seen. Electronically Signed   By: Marijo Conception, M.D.   On: 10/21/2017 10:43   Ct Bone Marrow Biopsy & Aspiration  Result Date: 10/23/2017 INDICATION: 41 year old female with a history of neutropenia EXAM: CT BONE MARROW BIOPSY AND ASPIRATION MEDICATIONS: None. ANESTHESIA/SEDATION: Moderate (conscious) sedation was employed during this procedure. A total of Versed 2.0 mg and Fentanyl 150 mcg was administered intravenously. Moderate Sedation Time: 12 minutes. The patient's level of consciousness and vital signs were monitored continuously by radiology nursing throughout the procedure under my direct supervision. FLUOROSCOPY TIME:  CT COMPLICATIONS: None  PROCEDURE: The procedure risks, benefits, and alternatives were explained to the patient. Questions regarding the procedure were encouraged and answered. The patient understands and consents to the procedure. Scout CT of the pelvis was performed for surgical planning purposes. The posterior pelvis was prepped with Betadinein a sterile fashion, and a sterile drape was applied covering the operative field. A sterile gown and sterile gloves were used for the procedure. Local anesthesia was provided with 1% Lidocaine. We targeted the left posterior iliac bone for biopsy. The skin and subcutaneous tissues were infiltrated with 1% lidocaine without epinephrine. A small stab incision was made with an 11 blade scalpel, and an 11 gauge Murphy needle was advanced with CT guidance to the posterior cortex. Manual forced was used to advance the needle through the posterior cortex and the stylet was removed. A bone marrow aspirate was retrieved and passed to a cytotechnologist in the room. The Murphy needle was then advanced without the stylet for a core biopsy. The core biopsy was retrieved and also passed to a cytotechnologist. Second core was achieved for culture purpose. Manual pressure was used for hemostasis and a sterile dressing was placed. No complications were encountered no significant blood loss was encountered. Patient tolerated the procedure well and remained hemodynamically stable throughout. IMPRESSION: Status post CT-guided bone marrow biopsy, with tissue specimen sent to pathology for complete histopathologic analysis Signed, Dulcy Fanny. Earleen Newport, DO [facility] Radiology Electronically Signed   By: Corrie Mckusick D.O.   On: 10/23/2017 12:14    Microbiology: Recent Results (from the past 240 hour(s))  Surgical pcr screen     Status: Abnormal   Collection Time: 10/15/17 10:14 AM  Result Value Ref Range Status   MRSA, PCR NEGATIVE NEGATIVE Final   Staphylococcus  aureus POSITIVE (A) NEGATIVE Final    Comment: (NOTE) The Xpert SA Assay (FDA approved for NASAL specimens in patients 93 years of age and older), is one component of a comprehensive surveillance program. It is not intended to diagnose infection nor to guide or monitor treatment.   Culture, blood (routine x 2)     Status: None (Preliminary result)   Collection Time: 10/20/17  9:18 AM  Result Value Ref Range Status   Specimen Description BLOOD LEFT ANTECUBITAL  Final   Special Requests   Final    BOTTLES DRAWN AEROBIC ONLY Blood Culture adequate volume   Culture NO GROWTH 4 DAYS  Final   Report Status PENDING  Incomplete  Culture, blood (routine x 2)     Status: None (Preliminary result)   Collection Time: 10/20/17  9:18 AM  Result Value Ref Range Status   Specimen Description BLOOD LEFT ANTECUBITAL  Final   Special Requests   Final    BOTTLES DRAWN AEROBIC ONLY Blood Culture adequate volume   Culture NO GROWTH 4  DAYS  Final   Report Status PENDING  Incomplete  Urine Culture     Status: Abnormal   Collection Time: 10/21/17  6:24 AM  Result Value Ref Range Status   Specimen Description URINE, CLEAN CATCH  Final   Special Requests Immunocompromised  Final   Culture <10,000 COLONIES/mL INSIGNIFICANT GROWTH (A)  Final   Report Status 10/22/2017 FINAL  Final  Culture, blood (x 2)     Status: None (Preliminary result)   Collection Time: 10/21/17  7:19 AM  Result Value Ref Range Status   Specimen Description BLOOD LEFT ARM  Final   Special Requests   Final    BOTTLES DRAWN AEROBIC ONLY Blood Culture adequate volume   Culture NO GROWTH 3 DAYS  Final   Report Status PENDING  Incomplete  Culture, blood (x 2)     Status: None (Preliminary result)   Collection Time: 10/21/17  7:24 AM  Result Value Ref Range Status   Specimen Description BLOOD LEFT HAND  Final   Special Requests   Final    BOTTLES DRAWN AEROBIC ONLY Blood Culture adequate volume   Culture NO GROWTH 3 DAYS  Final    Report Status PENDING  Incomplete  Culture, Urine     Status: None   Collection Time: 10/21/17 10:20 PM  Result Value Ref Range Status   Specimen Description URINE, CLEAN CATCH  Final   Special Requests NONE  Final   Culture NO GROWTH  Final   Report Status 10/23/2017 FINAL  Final  Culture, fungus without smear     Status: None (Preliminary result)   Collection Time: 10/23/17 11:23 AM  Result Value Ref Range Status   Specimen Description BONE MARROW  Final   Special Requests Immunocompromised  Final   Culture NO FUNGUS ISOLATED AFTER 1 DAY  Final   Report Status PENDING  Incomplete  Aerobic/Anaerobic Culture (surgical/deep wound)     Status: None (Preliminary result)   Collection Time: 10/23/17 11:23 AM  Result Value Ref Range Status   Specimen Description BONE MARROW  Final   Special Requests NONE  Final   Gram Stain   Final    MODERATE WBC PRESENT, PREDOMINANTLY MONONUCLEAR NO ORGANISMS SEEN    Culture NO GROWTH 1 DAY  Final   Report Status PENDING  Incomplete     Labs: Basic Metabolic Panel: Recent Labs  Lab 10/21/17 0526 10/22/17 0217 10/22/17 0757 10/23/17 0636 10/24/17 0236  NA 132* 132* 134* 133* 131*  K 3.2* 3.3* 3.2* 3.4* 3.3*  CL 102 102 105 105 106  CO2 22 23 23  20* 18*  GLUCOSE 99 114* 106* 89 98  BUN 8 8 9 11 13   CREATININE 0.90 0.97 0.96 0.90 0.95  CALCIUM 8.3* 7.9* 8.2* 8.3* 8.3*   Liver Function Tests: Recent Labs  Lab 10/20/17 0717 10/21/17 0526 10/23/17 0636  AST 22 39 119*  ALT 12* 14 52  ALKPHOS 225* 309* 1,081*  BILITOT 0.9 1.3* 3.8*  PROT 5.6* 5.4* 5.1*  ALBUMIN 2.3* 2.3* 2.2*   No results for input(s): LIPASE, AMYLASE in the last 168 hours. No results for input(s): AMMONIA in the last 168 hours. CBC: Recent Labs  Lab 10/20/17 0717 10/21/17 0526 10/22/17 0217 10/22/17 0757 10/22/17 2008 10/23/17 0404 10/24/17 0236  WBC 2.8* 2.0* 1.4* 1.2*  --  4.1 5.0  NEUTROABS 2.4 1.6*  --  0.9*  --  3.6 4.4  HGB 7.3* 6.9* 7.0* 6.9*  11.0* 9.5* 9.5*  HCT 22.8* 21.6* 21.9*  22.1* 33.5* 28.8* 29.1*  MCV 74.8* 74.7* 74.0* 74.2*  --  75.0* 75.8*  PLT 140* 139* 131* 125*  --  105* 99*   Cardiac Enzymes: Recent Labs  Lab 10/20/17 1603  TROPONINI <0.03   BNP: BNP (last 3 results) No results for input(s): BNP in the last 8760 hours.  ProBNP (last 3 results) No results for input(s): PROBNP in the last 8760 hours.  CBG: No results for input(s): GLUCAP in the last 168 hours.     Signed:  Kayleen Memos, MD [facility] 10/24/2017, 5:28 PM

## 2017-10-24 NOTE — Progress Notes (Signed)
[name] to be D/C'd Home per MD order.  Discussed with the patient and all questions fully answered.  VSS, Skin clean, dry and intact without evidence of skin break down, no evidence of skin tears noted. IV catheter discontinued intact. Site without signs and symptoms of complications. Dressing and pressure applied.  An After Visit Summary was printed and given to the patient. Patient received prescription.  D/c education completed with patient/family including follow up instructions, medication list, d/c activities limitations if indicated, with other d/c instructions as indicated by MD - patient able to verbalize understanding, all questions fully answered.   Patient instructed to return to ED, call 911, or call MD for any changes in condition.   Patient escorted via [place], and D/C home via private auto.  Dorris Carnes 10/24/2017 8:32 PM

## 2017-10-24 NOTE — Progress Notes (Signed)
[facility]  Date of Admission:  10/13/2017     Total days of antibiotics - Stopped                        Brief ID:  [name] POD 8 lap chole with advanced HIV/AIDS (CD4 10, VL 4.4 million) with fevers, low WBC count and anemia. Recently placed back on ART (Biktarvy).    Active Problems:   Anemia   Nausea & vomiting   Nausea and vomiting   Biliary dyskinesia   . benztropine  1 mg Oral Daily  . bictegravir-emtricitabine-tenofovir AF  1 tablet Oral Daily  . dapsone  100 mg Oral Daily  . enoxaparin (LOVENOX) injection  40 mg Subcutaneous Q24H  . feeding supplement  1 Container Oral TID BM  . fluconazole  200 mg Oral Daily  . folic acid  2 mg Oral Daily  . multivitamin  15 mL Oral Daily  . OLANZapine  5 mg Oral QHS  . pantoprazole  40 mg Oral BID  . potassium chloride  40 mEq Oral Once  . saccharomyces boulardii  250 mg Oral BID  . senna-docusate  1 tablet Oral BID  . sucralfate  1 g Oral TID WC & HS    SUBJECTIVE: No further fevers. Feeling better today. Back pain controlled better today. Abdominal pain "gone". Still with some post-prandial pain/issues.   Review of Systems: Review of Systems  Constitutional: Negative for chills and fever.  Respiratory: Negative.   Cardiovascular: Negative.   Gastrointestinal: Positive for diarrhea (2 BMs yesterday ). Negative for abdominal pain.  Musculoskeletal: Positive for back pain.  Skin: Negative for rash.  Neurological: Negative for dizziness and headaches.    Allergies  Allergen Reactions  . Other Hives and Itching    Berries, TREE NUTS  . Peanut-Containing Drug Products Hives  . Shellfish Allergy Hives  . Bactrim Itching  . Orange Fruit Itching  . Sulfa Antibiotics Itching    OBJECTIVE: Vitals:   10/23/17 1448 10/23/17 2118 10/24/17 0500 10/24/17 0556  BP: 126/84 (!) 143/90  115/79  Pulse: 84 84  (!) 110  Resp: _0 Temp:  98.4 F (36.9 C)  98.9 F (37.2 C)    TempSrc:  Oral  Oral  SpO2: 99% 98%  95%  Weight:   123 lb 1.6 oz (55.8 kg)   Height:       Body mass index is 16.7 kg/m.  Physical Exam  Constitutional: She is oriented to person, place, and time.  HENT:  Mouth/Throat: Oropharyngeal exudate present.  Eyes: No scleral icterus.  Neck: No thyromegaly present.  Cardiovascular: Normal rate, regular rhythm and normal heart sounds.  Pulmonary/Chest: Effort normal and breath sounds normal. No respiratory distress. She has no rales.  Abdominal: Soft. There is tenderness.  Musculoskeletal: Normal range of motion. She exhibits no edema.  Lymphadenopathy:    She has no cervical adenopathy.  Neurological: She is alert and oriented to person, place, and time. No cranial nerve deficit.  Skin: Skin is warm and dry.  Psychiatric: Affect normal.  Vitals reviewed.   Lab Results Serum CMV PCR 11/7 >> pending Human Parvovirus 11/7 >> pending  Lab Results  Component Value Date   WBC 5.0 10/24/2017   HGB 9.5 (L) 10/24/2017   HCT 29.1 (L) 10/24/2017   MCV 75.8 (L) 10/24/2017   PLT 99 (L) 10/24/2017    Lab  Results  Component Value Date   CREATININE 0.95 10/24/2017   BUN 13 10/24/2017   NA 131 (L) 10/24/2017   K 3.3 (L) 10/24/2017   CL 106 10/24/2017   CO2 18 (L) 10/24/2017    Lab Results  Component Value Date   ALT 52 10/23/2017   AST 119 (H) 10/23/2017   ALKPHOS 1,081 (H) 10/23/2017   BILITOT 3.8 (H) 10/23/2017     Microbiology: Bone Marrow Biopsy 11/8 > culture pending, no organisms on GS.   Recent Results (from the past 240 hour(s))  Surgical pcr screen     Status: Abnormal   Collection Time: 10/15/17 10:14 AM  Result Value Ref Range Status   MRSA, PCR NEGATIVE NEGATIVE Final   Staphylococcus aureus POSITIVE (A) NEGATIVE Final    Comment: (NOTE) The Xpert SA Assay (FDA approved for NASAL specimens in patients 30 years of age and older), is one component of a comprehensive surveillance program. It is not intended to  diagnose infection nor to guide or monitor treatment.   Culture, blood (routine x 2)     Status: None (Preliminary result)   Collection Time: 10/20/17  9:18 AM  Result Value Ref Range Status   Specimen Description BLOOD LEFT ANTECUBITAL  Final   Special Requests   Final    BOTTLES DRAWN AEROBIC ONLY Blood Culture adequate volume   Culture NO GROWTH 3 DAYS  Final   Report Status PENDING  Incomplete  Culture, blood (routine x 2)     Status: None (Preliminary result)   Collection Time: 10/20/17  9:18 AM  Result Value Ref Range Status   Specimen Description BLOOD LEFT ANTECUBITAL  Final   Special Requests   Final    BOTTLES DRAWN AEROBIC ONLY Blood Culture adequate volume   Culture NO GROWTH 3 DAYS  Final   Report Status PENDING  Incomplete  Urine Culture     Status: Abnormal   Collection Time: 10/21/17  6:24 AM  Result Value Ref Range Status   Specimen Description URINE, CLEAN CATCH  Final   Special Requests Immunocompromised  Final   Culture <10,000 COLONIES/mL INSIGNIFICANT GROWTH (A)  Final   Report Status 10/22/2017 FINAL  Final  Culture, blood (x 2)     Status: None (Preliminary result)   Collection Time: 10/21/17  7:19 AM  Result Value Ref Range Status   Specimen Description BLOOD LEFT ARM  Final   Special Requests   Final    BOTTLES DRAWN AEROBIC ONLY Blood Culture adequate volume   Culture NO GROWTH 2 DAYS  Final   Report Status PENDING  Incomplete  Culture, blood (x 2)     Status: None (Preliminary result)   Collection Time: 10/21/17  7:24 AM  Result Value Ref Range Status   Specimen Description BLOOD LEFT HAND  Final   Special Requests   Final    BOTTLES DRAWN AEROBIC ONLY Blood Culture adequate volume   Culture NO GROWTH 2 DAYS  Final   Report Status PENDING  Incomplete  Culture, Urine     Status: None   Collection Time: 10/21/17 10:20 PM  Result Value Ref Range Status   Specimen Description URINE, CLEAN CATCH  Final   Special Requests NONE  Final   Culture NO  GROWTH  Final   Report Status 10/23/2017 FINAL  Final  Aerobic/Anaerobic Culture (surgical/deep wound)     Status: None (Preliminary result)   Collection Time: 10/23/17 11:23 AM  Result Value Ref Range Status   Specimen  Description BONE MARROW  Final   Special Requests NONE  Final   Gram Stain   Final    MODERATE WBC PRESENT, PREDOMINANTLY MONONUCLEAR NO ORGANISMS SEEN    Culture PENDING  Incomplete   Report Status PENDING  Incomplete   IMAGING:  CT C/A/P 10/21/17 >>  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 Multiple tiny low-attenuation foci throughout the spleen noted. Nonspecific. New from 10/16/2016  Enlarged periaortic lymph nodes in upper abdomen with borderline enlarged mediastinal and right hilar nodes  Esophagus appears normal  Small ascites in pelvis   ASSESSMENT & PLAN: 1. HIV/AIDS: CD4 pre surgery only 10 with extremely high viral load.   Continue Biktarvy and Dapsone   Continue to hold azithromycin while we await AFB studies   Have made her an earlier follow up with me on November 21 at 3:30 pm.   2. Abdominal Pain/painful swallowing: Has had this issue for some time now and has had scopes with [facility] previously. Splenomegaly contributing? GI has seen - no EGD being she is neutropenic and has had symptoms c/w this in the past.   Need to separate carafate/magnesium/calcium and Biktarvy by at least 2 hours --> discussed this with her again today.   Still suspicious for MAC - await AFB cultures   Alk Phos elevated 309 >> 1081, AST 39 >> 119, TBili up to 3.2   3. Malnutrition: RD recommending cortrak placement for enteral nutrition.   Defer to primary team - would certainly be helpful to get her the nutrition she needs, however uncertain if she would be agreeable to this.   4. Fevers / Neutropenia: Dr. Marin Olp following and appreciate guidance. CT scan with splenomegally and some upper abdominal/mediastinal lymph nodes that are enlarged.    Fevers resolved   Neutropenia improving s/p Neupogen   Bone marrow results pending. Will send sample to [facility] for further DNA sequencing to ensure we do not miss opportunistic bacterial/fungal cause of her condition.   Urine clean, no sputum to check, AFB blood pending. CMV blood PCR negative.    Janene Madeira, MSN, NP-C [facility] [place] Cell: [phone] Pager: [phone]  10/24/2017  10:39 AM

## 2017-10-24 NOTE — Progress Notes (Signed)
Ms. Wrisley did have her bone marrow biopsy yesterday.  She is having a lot of back pain.  This is a little bit unusual with the bone marrow biopsy.  I wonder if some of this might be from the Neupogen.  I am going to stop her Neupogen as her white cell count is 5.0.  She is not having as much diarrhea.  She has been afebrile.  She is off IV antibiotic with the Maxipime. So far, her cultures are negative.  She did have cultures taken from the bone marrow procedure.  I would not expect the results from the bone marrow to be out until next week.  She has had no rashes.  I think that she will be more diligent with taking the HIV medication.  On her physical exam, her vital signs are all stable.  Her temperature was 98.9.  Pulse 110.  Blood pressure 115/79.  I cannot find anything on her exam that is localizing.  The thrush on her tongue is much better.  Hopefully, she will be able to go home soon.  I think she would welcome this.  Again, I have to believe that everything is being driven by her HIV and once this is under good control, then she will feel better and have normalization of her labs and symptoms.  [name], MD  Oswaldo Milian 11:2

## 2017-10-25 LAB — CULTURE, BLOOD (ROUTINE X 2)
Culture: NO GROWTH
Culture: NO GROWTH
SPECIAL REQUESTS: ADEQUATE
SPECIAL REQUESTS: ADEQUATE

## 2017-10-26 LAB — CULTURE, BLOOD (ROUTINE X 2)
Culture: NO GROWTH
Culture: NO GROWTH
SPECIAL REQUESTS: ADEQUATE
Special Requests: ADEQUATE

## 2017-10-27 LAB — HUMAN PARVOVIRUS DNA DETECTION BY PCR: PARVOVIRUS B19 PCR: NEGATIVE

## 2017-10-27 LAB — EPSTEIN BARR VRS(EBV DNA BY PCR)
EBV DNA QN by PCR: POSITIVE copies/mL
log10 EBV DNA Qn PCR: UNDETERMINED log10 copy/mL

## 2017-10-28 LAB — HIV-1 RNA ULTRAQUANT REFLEX TO GENTYP+
HIV-1 RNA BY PCR: 1080000 {copies}/mL
HIV-1 RNA Quant, Log: 6.033 log10copy/mL

## 2017-10-28 LAB — HIV GENOSURE(R) MG

## 2017-10-28 LAB — CMV DNA BY PCR, QUALITATIVE: CMV DNA, Qual PCR: POSITIVE — AB

## 2017-10-28 LAB — AEROBIC/ANAEROBIC CULTURE (SURGICAL/DEEP WOUND)

## 2017-10-28 LAB — AEROBIC/ANAEROBIC CULTURE W GRAM STAIN (SURGICAL/DEEP WOUND): Culture: NO GROWTH

## 2017-10-30 LAB — REFLEX TO GENOSURE(R) MG

## 2017-10-31 LAB — CHROMOSOME ANALYSIS, BONE MARROW

## 2017-11-05 ENCOUNTER — Inpatient Hospital Stay: Payer: Medicaid Other | Admitting: Infectious Diseases

## 2017-11-12 ENCOUNTER — Telehealth: Payer: Self-pay | Admitting: *Deleted

## 2017-11-12 NOTE — Telephone Encounter (Signed)
Patient left message in triage reporting difficulty with fluconazole - she states she throws it up. SHe would like to know if she can use nystatin instead. If so, she would need a prescription. Landis Gandy, RN

## 2017-11-13 ENCOUNTER — Other Ambulatory Visit: Payer: Self-pay | Admitting: Pharmacist

## 2017-11-13 ENCOUNTER — Other Ambulatory Visit: Payer: Self-pay | Admitting: *Deleted

## 2017-11-13 ENCOUNTER — Encounter (HOSPITAL_COMMUNITY): Payer: Self-pay

## 2017-11-13 LAB — CULTURE, FUNGUS WITHOUT SMEAR

## 2017-11-13 NOTE — Telephone Encounter (Signed)
Can do nystatin  For her. Nystatin 400,000   Units QID x 14 d. Needs to reschedule with stephanie since missed last appt .

## 2017-11-17 NOTE — Telephone Encounter (Signed)
Left message for patient to get her into clinic.

## 2017-11-20 ENCOUNTER — Ambulatory Visit: Payer: Medicaid Other | Admitting: Internal Medicine

## 2017-11-27 ENCOUNTER — Ambulatory Visit: Payer: Medicaid Other | Admitting: Infectious Diseases

## 2017-12-07 LAB — ACID FAST CULTURE WITH REFLEXED SENSITIVITIES (MYCOBACTERIA): Acid Fast Culture: NEGATIVE

## 2017-12-07 LAB — ACID FAST CULTURE WITH REFLEXED SENSITIVITIES: ACID FAST CULTURE - AFSCU3: NEGATIVE

## 2017-12-29 ENCOUNTER — Inpatient Hospital Stay (HOSPITAL_COMMUNITY)
Admission: EM | Admit: 2017-12-29 | Discharge: 2018-01-20 | DRG: 438 | Disposition: A | Discharge status: Home / Self Care | Payer: Medicaid Other | Attending: Internal Medicine | Admitting: Internal Medicine

## 2017-12-29 ENCOUNTER — Encounter (HOSPITAL_COMMUNITY): Payer: Self-pay | Admitting: Family Medicine

## 2017-12-29 ENCOUNTER — Other Ambulatory Visit: Payer: Self-pay

## 2017-12-29 ENCOUNTER — Telehealth: Payer: Self-pay | Admitting: *Deleted

## 2017-12-29 ENCOUNTER — Emergency Department (HOSPITAL_COMMUNITY): Payer: Medicaid Other

## 2017-12-29 DIAGNOSIS — T380X5A Adverse effect of glucocorticoids and synthetic analogues, initial encounter: Secondary | ICD-10-CM | POA: Diagnosis present

## 2017-12-29 DIAGNOSIS — R739 Hyperglycemia, unspecified: Secondary | ICD-10-CM | POA: Diagnosis not present

## 2017-12-29 DIAGNOSIS — E222 Syndrome of inappropriate secretion of antidiuretic hormone: Secondary | ICD-10-CM | POA: Diagnosis not present

## 2017-12-29 DIAGNOSIS — R5081 Fever presenting with conditions classified elsewhere: Secondary | ICD-10-CM

## 2017-12-29 DIAGNOSIS — E43 Unspecified severe protein-calorie malnutrition: Secondary | ICD-10-CM | POA: Diagnosis present

## 2017-12-29 DIAGNOSIS — T501X5A Adverse effect of loop [high-ceiling] diuretics, initial encounter: Secondary | ICD-10-CM | POA: Diagnosis not present

## 2017-12-29 DIAGNOSIS — R059 Cough, unspecified: Secondary | ICD-10-CM

## 2017-12-29 DIAGNOSIS — R1310 Dysphagia, unspecified: Secondary | ICD-10-CM

## 2017-12-29 DIAGNOSIS — D61818 Other pancytopenia: Secondary | ICD-10-CM | POA: Diagnosis present

## 2017-12-29 DIAGNOSIS — L8990 Pressure ulcer of unspecified site, unspecified stage: Secondary | ICD-10-CM | POA: Diagnosis present

## 2017-12-29 DIAGNOSIS — Y92230 Patient room in hospital as the place of occurrence of the external cause: Secondary | ICD-10-CM

## 2017-12-29 DIAGNOSIS — D638 Anemia in other chronic diseases classified elsewhere: Secondary | ICD-10-CM | POA: Diagnosis present

## 2017-12-29 DIAGNOSIS — D6959 Other secondary thrombocytopenia: Secondary | ICD-10-CM | POA: Diagnosis present

## 2017-12-29 DIAGNOSIS — J45909 Unspecified asthma, uncomplicated: Secondary | ICD-10-CM | POA: Diagnosis present

## 2017-12-29 DIAGNOSIS — K859 Acute pancreatitis without necrosis or infection, unspecified: Principal | ICD-10-CM | POA: Diagnosis present

## 2017-12-29 DIAGNOSIS — D510 Vitamin B12 deficiency anemia due to intrinsic factor deficiency: Secondary | ICD-10-CM

## 2017-12-29 DIAGNOSIS — Z9119 Patient's noncompliance with other medical treatment and regimen: Secondary | ICD-10-CM

## 2017-12-29 DIAGNOSIS — D693 Immune thrombocytopenic purpura: Secondary | ICD-10-CM | POA: Diagnosis present

## 2017-12-29 DIAGNOSIS — Z9114 Patient's other noncompliance with medication regimen: Secondary | ICD-10-CM

## 2017-12-29 DIAGNOSIS — K2210 Ulcer of esophagus without bleeding: Secondary | ICD-10-CM | POA: Diagnosis present

## 2017-12-29 DIAGNOSIS — Z681 Body mass index (BMI) 19 or less, adult: Secondary | ICD-10-CM

## 2017-12-29 DIAGNOSIS — E872 Acidosis, unspecified: Secondary | ICD-10-CM

## 2017-12-29 DIAGNOSIS — R627 Adult failure to thrive: Secondary | ICD-10-CM | POA: Diagnosis present

## 2017-12-29 DIAGNOSIS — W06XXXA Fall from bed, initial encounter: Secondary | ICD-10-CM | POA: Diagnosis not present

## 2017-12-29 DIAGNOSIS — B251 Cytomegaloviral hepatitis: Secondary | ICD-10-CM | POA: Diagnosis present

## 2017-12-29 DIAGNOSIS — B20 Human immunodeficiency virus [HIV] disease: Secondary | ICD-10-CM | POA: Diagnosis present

## 2017-12-29 DIAGNOSIS — D709 Neutropenia, unspecified: Secondary | ICD-10-CM

## 2017-12-29 DIAGNOSIS — Z91013 Allergy to seafood: Secondary | ICD-10-CM

## 2017-12-29 DIAGNOSIS — R001 Bradycardia, unspecified: Secondary | ICD-10-CM | POA: Diagnosis not present

## 2017-12-29 DIAGNOSIS — E162 Hypoglycemia, unspecified: Secondary | ICD-10-CM | POA: Diagnosis not present

## 2017-12-29 DIAGNOSIS — E876 Hypokalemia: Secondary | ICD-10-CM | POA: Diagnosis present

## 2017-12-29 DIAGNOSIS — K449 Diaphragmatic hernia without obstruction or gangrene: Secondary | ICD-10-CM | POA: Diagnosis present

## 2017-12-29 DIAGNOSIS — R748 Abnormal levels of other serum enzymes: Secondary | ICD-10-CM

## 2017-12-29 DIAGNOSIS — E890 Postprocedural hypothyroidism: Secondary | ICD-10-CM | POA: Diagnosis present

## 2017-12-29 DIAGNOSIS — E8770 Fluid overload, unspecified: Secondary | ICD-10-CM | POA: Diagnosis not present

## 2017-12-29 DIAGNOSIS — D696 Thrombocytopenia, unspecified: Secondary | ICD-10-CM

## 2017-12-29 DIAGNOSIS — K222 Esophageal obstruction: Secondary | ICD-10-CM | POA: Diagnosis present

## 2017-12-29 DIAGNOSIS — I10 Essential (primary) hypertension: Secondary | ICD-10-CM | POA: Diagnosis present

## 2017-12-29 DIAGNOSIS — F319 Bipolar disorder, unspecified: Secondary | ICD-10-CM | POA: Diagnosis present

## 2017-12-29 DIAGNOSIS — R64 Cachexia: Secondary | ICD-10-CM | POA: Diagnosis present

## 2017-12-29 DIAGNOSIS — K120 Recurrent oral aphthae: Secondary | ICD-10-CM | POA: Diagnosis present

## 2017-12-29 DIAGNOSIS — E538 Deficiency of other specified B group vitamins: Secondary | ICD-10-CM

## 2017-12-29 DIAGNOSIS — Z91010 Allergy to peanuts: Secondary | ICD-10-CM

## 2017-12-29 DIAGNOSIS — B3781 Candidal esophagitis: Secondary | ICD-10-CM | POA: Diagnosis present

## 2017-12-29 DIAGNOSIS — B179 Acute viral hepatitis, unspecified: Secondary | ICD-10-CM | POA: Diagnosis present

## 2017-12-29 DIAGNOSIS — K219 Gastro-esophageal reflux disease without esophagitis: Secondary | ICD-10-CM | POA: Diagnosis present

## 2017-12-29 DIAGNOSIS — R05 Cough: Secondary | ICD-10-CM

## 2017-12-29 DIAGNOSIS — E874 Mixed disorder of acid-base balance: Secondary | ICD-10-CM | POA: Diagnosis not present

## 2017-12-29 DIAGNOSIS — Z79899 Other long term (current) drug therapy: Secondary | ICD-10-CM

## 2017-12-29 DIAGNOSIS — I82890 Acute embolism and thrombosis of other specified veins: Secondary | ICD-10-CM | POA: Diagnosis not present

## 2017-12-29 DIAGNOSIS — R52 Pain, unspecified: Secondary | ICD-10-CM

## 2017-12-29 DIAGNOSIS — D500 Iron deficiency anemia secondary to blood loss (chronic): Secondary | ICD-10-CM

## 2017-12-29 DIAGNOSIS — B370 Candidal stomatitis: Secondary | ICD-10-CM | POA: Diagnosis present

## 2017-12-29 DIAGNOSIS — B1910 Unspecified viral hepatitis B without hepatic coma: Secondary | ICD-10-CM | POA: Diagnosis present

## 2017-12-29 DIAGNOSIS — R509 Fever, unspecified: Secondary | ICD-10-CM

## 2017-12-29 DIAGNOSIS — K920 Hematemesis: Secondary | ICD-10-CM | POA: Diagnosis present

## 2017-12-29 DIAGNOSIS — Z882 Allergy status to sulfonamides status: Secondary | ICD-10-CM

## 2017-12-29 DIAGNOSIS — Z91018 Allergy to other foods: Secondary | ICD-10-CM

## 2017-12-29 DIAGNOSIS — K719 Toxic liver disease, unspecified: Secondary | ICD-10-CM | POA: Diagnosis present

## 2017-12-29 DIAGNOSIS — K828 Other specified diseases of gallbladder: Secondary | ICD-10-CM | POA: Diagnosis present

## 2017-12-29 LAB — COMPREHENSIVE METABOLIC PANEL
ALK PHOS: 944 U/L — AB (ref 38–126)
ALT: 37 U/L (ref 14–54)
AST: 161 U/L — AB (ref 15–41)
Albumin: 2.6 g/dL — ABNORMAL LOW (ref 3.5–5.0)
Anion gap: 13 (ref 5–15)
BUN: 19 mg/dL (ref 6–20)
CHLORIDE: 101 mmol/L (ref 101–111)
CO2: 25 mmol/L (ref 22–32)
CREATININE: 0.52 mg/dL (ref 0.44–1.00)
Calcium: 9.1 mg/dL (ref 8.9–10.3)
GFR calc Af Amer: >60 mL/min (ref >60)
GFR calc non Af Amer: >60 mL/min (ref >60)
Glucose, Bld: 169 mg/dL — ABNORMAL HIGH (ref 65–99)
Potassium: 2.3 mmol/L — CL (ref 3.5–5.1)
Sodium: 139 mmol/L (ref 135–145)
Total Bilirubin: 14.7 mg/dL — ABNORMAL HIGH (ref 0.3–1.2)
Total Protein: 7.1 g/dL (ref 6.5–8.1)

## 2017-12-29 LAB — CBC
HCT: 28.9 % — ABNORMAL LOW (ref 36.0–46.0)
Hemoglobin: 9.6 g/dL — ABNORMAL LOW (ref 12.0–15.0)
MCH: 25.2 pg — AB (ref 26.0–34.0)
MCHC: 33.2 g/dL (ref 30.0–36.0)
MCV: 75.9 fL — AB (ref 78.0–100.0)
PLATELETS: 38 10*3/uL — AB (ref 150–400)
RBC: 3.81 MIL/uL — ABNORMAL LOW (ref 3.87–5.11)
RDW: 21.1 % — AB (ref 11.5–15.5)
WBC: 3.3 10*3/uL — AB (ref 4.0–10.5)

## 2017-12-29 LAB — LIPASE, BLOOD: LIPASE: 1305 U/L — AB (ref 11–51)

## 2017-12-29 LAB — I-STAT BETA HCG BLOOD, ED (MC, WL, AP ONLY): I-stat hCG, quantitative: <5 m[IU]/mL (ref <5)

## 2017-12-29 MED ORDER — IOPAMIDOL (ISOVUE-300) INJECTION 61%
100.0000 mL | Freq: Once | INTRAVENOUS | Status: AC | PRN
  Administered 2017-12-30: 80 mL via INTRAVENOUS

## 2017-12-29 MED ORDER — IOPAMIDOL (ISOVUE-300) INJECTION 61%
INTRAVENOUS | Status: AC
  Administered 2017-12-30: 80 mL via INTRAVENOUS
  Filled 2017-12-29: qty 30

## 2017-12-29 MED ORDER — SODIUM CHLORIDE 0.9 % IV BOLUS (SEPSIS)
1000.0000 mL | Freq: Once | INTRAVENOUS | Status: AC
  Administered 2017-12-29: 1000 mL via INTRAVENOUS

## 2017-12-29 MED ORDER — ONDANSETRON HCL 4 MG/2ML IJ SOLN
4.0000 mg | Freq: Once | INTRAMUSCULAR | Status: AC
  Administered 2017-12-29: 4 mg via INTRAVENOUS
  Filled 2017-12-29: qty 2

## 2017-12-29 MED ORDER — MORPHINE SULFATE (PF) 4 MG/ML IV SOLN
4.0000 mg | Freq: Once | INTRAVENOUS | Status: AC
  Administered 2017-12-29: 4 mg via INTRAVENOUS
  Filled 2017-12-29: qty 1

## 2017-12-29 MED ORDER — POTASSIUM CHLORIDE 10 MEQ/100ML IV SOLN
10.0000 meq | INTRAVENOUS | Status: AC
  Administered 2017-12-29 – 2017-12-30 (×3): 10 meq via INTRAVENOUS
  Filled 2017-12-29 (×3): qty 100

## 2017-12-29 MED ORDER — IOPAMIDOL (ISOVUE-300) INJECTION 61%
INTRAVENOUS | Status: AC
  Filled 2017-12-29: qty 100

## 2017-12-29 NOTE — ED Triage Notes (Signed)
Patient is from home and transported via [facility]. Patient is experiencing generalized abd pain for the last two weeks. Also, has been vomiting blood and urinating blood for the last 2 days. Per EMS, abd is soft and tender.

## 2017-12-29 NOTE — ED Notes (Signed)
Bed: QV67 Expected date:  Expected time:  Means of arrival:  Comments: Cleaning soon to be ready

## 2017-12-29 NOTE — Telephone Encounter (Signed)
[name] is taking the patient to [facility] ED now.  [name] is very concerned about her sister and stated that she was going to have a "serious" conversation with her.

## 2017-12-29 NOTE — Telephone Encounter (Signed)
Agree 

## 2017-12-29 NOTE — Telephone Encounter (Signed)
Sister received call from patient's son, very concerned about patient's current condition.  Son at school.  No one with at patient at home.  Patient may not be able to get to the door.  RN advised that the patient needs to be taken to [facility] via ambulance whenever there is someone to let EMS into the house.  Patient's sister agreed to call the son with this information so that patient may receive treatment.

## 2017-12-29 NOTE — ED Notes (Signed)
Bed: WLPT2 Expected date:  Expected time:  Means of arrival:  Comments: 

## 2017-12-30 ENCOUNTER — Emergency Department (HOSPITAL_COMMUNITY): Payer: Medicaid Other

## 2017-12-30 ENCOUNTER — Encounter (HOSPITAL_COMMUNITY): Payer: Self-pay

## 2017-12-30 DIAGNOSIS — E871 Hypo-osmolality and hyponatremia: Secondary | ICD-10-CM | POA: Diagnosis not present

## 2017-12-30 DIAGNOSIS — R001 Bradycardia, unspecified: Secondary | ICD-10-CM | POA: Diagnosis not present

## 2017-12-30 DIAGNOSIS — K449 Diaphragmatic hernia without obstruction or gangrene: Secondary | ICD-10-CM | POA: Diagnosis present

## 2017-12-30 DIAGNOSIS — G9340 Encephalopathy, unspecified: Secondary | ICD-10-CM | POA: Diagnosis not present

## 2017-12-30 DIAGNOSIS — R1310 Dysphagia, unspecified: Secondary | ICD-10-CM

## 2017-12-30 DIAGNOSIS — E43 Unspecified severe protein-calorie malnutrition: Secondary | ICD-10-CM

## 2017-12-30 DIAGNOSIS — Z882 Allergy status to sulfonamides status: Secondary | ICD-10-CM | POA: Diagnosis not present

## 2017-12-30 DIAGNOSIS — K209 Esophagitis, unspecified: Secondary | ICD-10-CM | POA: Diagnosis not present

## 2017-12-30 DIAGNOSIS — Z91013 Allergy to seafood: Secondary | ICD-10-CM | POA: Diagnosis not present

## 2017-12-30 DIAGNOSIS — B370 Candidal stomatitis: Secondary | ICD-10-CM | POA: Diagnosis present

## 2017-12-30 DIAGNOSIS — Z881 Allergy status to other antibiotic agents status: Secondary | ICD-10-CM | POA: Diagnosis not present

## 2017-12-30 DIAGNOSIS — E872 Acidosis: Secondary | ICD-10-CM | POA: Diagnosis not present

## 2017-12-30 DIAGNOSIS — K828 Other specified diseases of gallbladder: Secondary | ICD-10-CM | POA: Diagnosis not present

## 2017-12-30 DIAGNOSIS — E890 Postprocedural hypothyroidism: Secondary | ICD-10-CM | POA: Diagnosis present

## 2017-12-30 DIAGNOSIS — K222 Esophageal obstruction: Secondary | ICD-10-CM | POA: Diagnosis present

## 2017-12-30 DIAGNOSIS — E874 Mixed disorder of acid-base balance: Secondary | ICD-10-CM | POA: Diagnosis not present

## 2017-12-30 DIAGNOSIS — B3781 Candidal esophagitis: Secondary | ICD-10-CM | POA: Diagnosis not present

## 2017-12-30 DIAGNOSIS — Z91010 Allergy to peanuts: Secondary | ICD-10-CM | POA: Diagnosis not present

## 2017-12-30 DIAGNOSIS — E876 Hypokalemia: Secondary | ICD-10-CM | POA: Diagnosis not present

## 2017-12-30 DIAGNOSIS — Y92230 Patient room in hospital as the place of occurrence of the external cause: Secondary | ICD-10-CM | POA: Diagnosis not present

## 2017-12-30 DIAGNOSIS — E079 Disorder of thyroid, unspecified: Secondary | ICD-10-CM | POA: Diagnosis not present

## 2017-12-30 DIAGNOSIS — K838 Other specified diseases of biliary tract: Secondary | ICD-10-CM | POA: Diagnosis not present

## 2017-12-30 DIAGNOSIS — K920 Hematemesis: Secondary | ICD-10-CM | POA: Diagnosis present

## 2017-12-30 DIAGNOSIS — B1910 Unspecified viral hepatitis B without hepatic coma: Secondary | ICD-10-CM | POA: Diagnosis present

## 2017-12-30 DIAGNOSIS — K8502 Idiopathic acute pancreatitis with infected necrosis: Secondary | ICD-10-CM | POA: Diagnosis not present

## 2017-12-30 DIAGNOSIS — K859 Acute pancreatitis without necrosis or infection, unspecified: Secondary | ICD-10-CM | POA: Diagnosis present

## 2017-12-30 DIAGNOSIS — D61818 Other pancytopenia: Secondary | ICD-10-CM | POA: Diagnosis not present

## 2017-12-30 DIAGNOSIS — B20 Human immunodeficiency virus [HIV] disease: Secondary | ICD-10-CM | POA: Diagnosis present

## 2017-12-30 DIAGNOSIS — R64 Cachexia: Secondary | ICD-10-CM | POA: Diagnosis present

## 2017-12-30 DIAGNOSIS — R748 Abnormal levels of other serum enzymes: Secondary | ICD-10-CM | POA: Diagnosis not present

## 2017-12-30 DIAGNOSIS — B251 Cytomegaloviral hepatitis: Secondary | ICD-10-CM | POA: Diagnosis present

## 2017-12-30 DIAGNOSIS — Z8719 Personal history of other diseases of the digestive system: Secondary | ICD-10-CM | POA: Diagnosis not present

## 2017-12-30 DIAGNOSIS — D573 Sickle-cell trait: Secondary | ICD-10-CM | POA: Diagnosis not present

## 2017-12-30 DIAGNOSIS — I82890 Acute embolism and thrombosis of other specified veins: Secondary | ICD-10-CM | POA: Diagnosis not present

## 2017-12-30 DIAGNOSIS — R634 Abnormal weight loss: Secondary | ICD-10-CM | POA: Diagnosis not present

## 2017-12-30 DIAGNOSIS — D693 Immune thrombocytopenic purpura: Secondary | ICD-10-CM | POA: Diagnosis present

## 2017-12-30 DIAGNOSIS — F319 Bipolar disorder, unspecified: Secondary | ICD-10-CM | POA: Diagnosis present

## 2017-12-30 DIAGNOSIS — K8500 Idiopathic acute pancreatitis without necrosis or infection: Secondary | ICD-10-CM | POA: Diagnosis not present

## 2017-12-30 DIAGNOSIS — W06XXXA Fall from bed, initial encounter: Secondary | ICD-10-CM | POA: Diagnosis not present

## 2017-12-30 DIAGNOSIS — K851 Biliary acute pancreatitis without necrosis or infection: Secondary | ICD-10-CM | POA: Diagnosis not present

## 2017-12-30 DIAGNOSIS — J45909 Unspecified asthma, uncomplicated: Secondary | ICD-10-CM | POA: Diagnosis present

## 2017-12-30 DIAGNOSIS — K130 Diseases of lips: Secondary | ICD-10-CM | POA: Diagnosis not present

## 2017-12-30 DIAGNOSIS — M316 Other giant cell arteritis: Secondary | ICD-10-CM | POA: Diagnosis not present

## 2017-12-30 DIAGNOSIS — Z681 Body mass index (BMI) 19 or less, adult: Secondary | ICD-10-CM | POA: Diagnosis not present

## 2017-12-30 DIAGNOSIS — D500 Iron deficiency anemia secondary to blood loss (chronic): Secondary | ICD-10-CM | POA: Diagnosis not present

## 2017-12-30 DIAGNOSIS — K8501 Idiopathic acute pancreatitis with uninfected necrosis: Secondary | ICD-10-CM | POA: Diagnosis not present

## 2017-12-30 DIAGNOSIS — R509 Fever, unspecified: Secondary | ICD-10-CM | POA: Diagnosis not present

## 2017-12-30 DIAGNOSIS — Z9114 Patient's other noncompliance with medication regimen: Secondary | ICD-10-CM | POA: Diagnosis not present

## 2017-12-30 DIAGNOSIS — B179 Acute viral hepatitis, unspecified: Secondary | ICD-10-CM | POA: Diagnosis not present

## 2017-12-30 DIAGNOSIS — E611 Iron deficiency: Secondary | ICD-10-CM | POA: Diagnosis not present

## 2017-12-30 DIAGNOSIS — R109 Unspecified abdominal pain: Secondary | ICD-10-CM | POA: Diagnosis not present

## 2017-12-30 DIAGNOSIS — D6959 Other secondary thrombocytopenia: Secondary | ICD-10-CM | POA: Diagnosis not present

## 2017-12-30 DIAGNOSIS — K2210 Ulcer of esophagus without bleeding: Secondary | ICD-10-CM | POA: Diagnosis present

## 2017-12-30 DIAGNOSIS — E222 Syndrome of inappropriate secretion of antidiuretic hormone: Secondary | ICD-10-CM | POA: Diagnosis not present

## 2017-12-30 DIAGNOSIS — D696 Thrombocytopenia, unspecified: Secondary | ICD-10-CM | POA: Diagnosis not present

## 2017-12-30 DIAGNOSIS — Z91018 Allergy to other foods: Secondary | ICD-10-CM | POA: Diagnosis not present

## 2017-12-30 LAB — URINALYSIS, ROUTINE W REFLEX MICROSCOPIC
Glucose, UA: NEGATIVE mg/dL
Hgb urine dipstick: NEGATIVE
Ketones, ur: NEGATIVE mg/dL
Leukocytes, UA: NEGATIVE
Nitrite: NEGATIVE
Protein, ur: NEGATIVE mg/dL
pH: 6 (ref 5.0–8.0)

## 2017-12-30 LAB — BASIC METABOLIC PANEL
ANION GAP: 8 (ref 5–15)
BUN: 18 mg/dL (ref 6–20)
CALCIUM: 8 mg/dL — AB (ref 8.9–10.3)
CO2: 24 mmol/L (ref 22–32)
Chloride: 110 mmol/L (ref 101–111)
Creatinine, Ser: 0.54 mg/dL (ref 0.44–1.00)
GFR calc Af Amer: >60 mL/min (ref >60)
GLUCOSE: 117 mg/dL — AB (ref 65–99)
POTASSIUM: 2.9 mmol/L — AB (ref 3.5–5.1)
SODIUM: 142 mmol/L (ref 135–145)

## 2017-12-30 LAB — RAPID URINE DRUG SCREEN, HOSP PERFORMED
Amphetamines: NOT DETECTED
BARBITURATES: NOT DETECTED
Benzodiazepines: NOT DETECTED
Cocaine: NOT DETECTED
OPIATES: POSITIVE — AB
TETRAHYDROCANNABINOL: NOT DETECTED

## 2017-12-30 LAB — MAGNESIUM: MAGNESIUM: 2 mg/dL (ref 1.7–2.4)

## 2017-12-30 LAB — TRIGLYCERIDES: TRIGLYCERIDES: 400 mg/dL — AB (ref <150)

## 2017-12-30 MED ORDER — MORPHINE SULFATE (PF) 4 MG/ML IV SOLN
2.0000 mg | INTRAVENOUS | Status: DC | PRN
Start: 2017-12-30 — End: 2018-01-05
  Administered 2017-12-30 – 2018-01-05 (×19): 2 mg via INTRAVENOUS
  Filled 2017-12-30 (×19): qty 1

## 2017-12-30 MED ORDER — ALBUTEROL SULFATE (2.5 MG/3ML) 0.083% IN NEBU
2.5000 mg | INHALATION_SOLUTION | RESPIRATORY_TRACT | Status: DC | PRN
  Administered 2018-01-07: 2.5 mg via RESPIRATORY_TRACT
  Filled 2017-12-30: qty 3

## 2017-12-30 MED ORDER — SODIUM CHLORIDE 0.9 % IV SOLN
INTRAVENOUS | Status: DC
  Administered 2017-12-30 – 2017-12-31 (×3): via INTRAVENOUS

## 2017-12-30 MED ORDER — BENZTROPINE MESYLATE 0.5 MG PO TABS
1.0000 mg | ORAL_TABLET | Freq: Every day | ORAL | Status: DC
  Administered 2017-12-30 – 2018-01-04 (×6): 1 mg via ORAL
  Filled 2017-12-30 (×3): qty 2
  Filled 2017-12-30: qty 1
  Filled 2017-12-30 (×4): qty 2

## 2017-12-30 MED ORDER — FLUCONAZOLE IN SODIUM CHLORIDE 200-0.9 MG/100ML-% IV SOLN
200.0000 mg | Freq: Once | INTRAVENOUS | Status: AC
  Administered 2017-12-30: 200 mg via INTRAVENOUS
  Filled 2017-12-30: qty 100

## 2017-12-30 MED ORDER — HYDROCODONE-ACETAMINOPHEN 5-325 MG PO TABS: 1.0000 | ORAL_TABLET | ORAL | Status: DC | PRN

## 2017-12-30 MED ORDER — ALBUTEROL SULFATE HFA 108 (90 BASE) MCG/ACT IN AERS
2.0000 | INHALATION_SPRAY | RESPIRATORY_TRACT | Status: DC | PRN
  Filled 2017-12-30: qty 6.7

## 2017-12-30 MED ORDER — OLANZAPINE 5 MG PO TABS
5.0000 mg | ORAL_TABLET | Freq: Every day | ORAL | Status: DC
  Administered 2017-12-30 – 2018-01-03 (×4): 5 mg via ORAL
  Filled 2017-12-30 (×4): qty 1

## 2017-12-30 MED ORDER — POTASSIUM CHLORIDE 10 MEQ/100ML IV SOLN
10.0000 meq | INTRAVENOUS | Status: DC
  Administered 2017-12-30 (×3): 10 meq via INTRAVENOUS
  Filled 2017-12-30: qty 100

## 2017-12-30 MED ORDER — MORPHINE SULFATE (PF) 2 MG/ML IV SOLN
2.0000 mg | INTRAVENOUS | Status: DC | PRN
  Administered 2017-12-30 (×2): 2 mg via INTRAVENOUS
  Filled 2017-12-30 (×2): qty 1

## 2017-12-30 MED ORDER — MORPHINE SULFATE (PF) 4 MG/ML IV SOLN
4.0000 mg | Freq: Once | INTRAVENOUS | Status: AC
Start: 2017-12-30 — End: 2017-12-30
  Administered 2017-12-30: 4 mg via INTRAVENOUS
  Filled 2017-12-30: qty 1

## 2017-12-30 MED ORDER — ONDANSETRON HCL 4 MG/2ML IJ SOLN
4.0000 mg | Freq: Four times a day (QID) | INTRAMUSCULAR | Status: DC | PRN
  Administered 2017-12-30 – 2018-01-06 (×4): 4 mg via INTRAVENOUS
  Filled 2017-12-30 (×4): qty 2

## 2017-12-30 MED ORDER — BICTEGRAVIR-EMTRICITAB-TENOFOV 50-200-25 MG PO TABS
1.0000 | ORAL_TABLET | Freq: Every day | ORAL | Status: DC
  Administered 2017-12-30 – 2018-01-04 (×5): 1 via ORAL
  Filled 2017-12-30 (×7): qty 1

## 2017-12-30 MED ORDER — ONDANSETRON HCL 4 MG PO TABS
4.0000 mg | ORAL_TABLET | Freq: Four times a day (QID) | ORAL | Status: DC | PRN
  Filled 2017-12-30: qty 1

## 2017-12-30 MED ORDER — FENTANYL CITRATE (PF) 100 MCG/2ML IJ SOLN
25.0000 ug | Freq: Once | INTRAMUSCULAR | Status: AC
  Administered 2017-12-30: 25 ug via INTRAVENOUS
  Filled 2017-12-30: qty 2

## 2017-12-30 MED ORDER — SODIUM CHLORIDE 0.9 % IV SOLN
INTRAVENOUS | Status: DC
  Administered 2017-12-30: 06:00:00 via INTRAVENOUS

## 2017-12-30 MED ORDER — FLUCONAZOLE IN SODIUM CHLORIDE 400-0.9 MG/200ML-% IV SOLN
400.0000 mg | INTRAVENOUS | Status: DC
  Administered 2017-12-31 – 2018-01-03 (×4): 400 mg via INTRAVENOUS
  Filled 2017-12-30 (×4): qty 200

## 2017-12-30 MED ORDER — IOPAMIDOL (ISOVUE-300) INJECTION 61%
INTRAVENOUS | Status: AC
  Filled 2017-12-30: qty 100

## 2017-12-30 MED ORDER — POTASSIUM CHLORIDE 10 MEQ/100ML IV SOLN
10.0000 meq | INTRAVENOUS | Status: AC
  Administered 2017-12-30 (×4): 10 meq via INTRAVENOUS
  Filled 2017-12-30 (×4): qty 100

## 2017-12-30 MED ORDER — POTASSIUM CHLORIDE 10 MEQ/100ML IV SOLN: 10.0000 meq | Freq: Once | INTRAVENOUS | Status: DC

## 2017-12-30 MED ORDER — FLUCONAZOLE 100MG IVPB: 100.0000 mg | INTRAVENOUS | Status: DC

## 2017-12-30 NOTE — Progress Notes (Signed)
Patient arrived on unit via stretcher from ED not family at bedside. Telemetry placed per MD order and CMT notified

## 2017-12-30 NOTE — Progress Notes (Signed)
Pharmacy Antibiotic Note  [name] is a 42 y.o. female admitted on 12/29/2017 with pancreatitis, oral candidiasis, HIV positive.  Pharmacy has been consulted for Fluconazole dosing.  Plan: Fluconazole 200mg  IV x1, followed by 100mg  IV daily Fluconazole to po when can tolerate  Height: 6\' 1"  (185.4 cm) Weight: 95 lb (43.1 kg) IBW/kg (Calculated) : 75.4  Temp (24hrs), Avg:98 F (36.7 C), Min:98 F (36.7 C), Max:98 F (36.7 C)  Recent Labs  Lab 12/29/17 1940  WBC 3.3*  CREATININE 0.52    Estimated Creatinine Clearance: 63.6 mL/min (by C-G formula based on SCr of 0.52 mg/dL).    Allergies  Allergen Reactions  . Other Hives and Itching    Berries, TREE NUTS  . Peanut-Containing Drug Products Hives  . Shellfish Allergy Hives  . Bactrim Itching  . Orange Fruit Itching  . Sulfa Antibiotics Itching    Antimicrobials this admission: 1/15 Fluconazole >>   Dose adjustments this admission:  Microbiology results: None  Thank you for allowing pharmacy to be a part of this patient's care.  Minda Ditto 12/30/2017 9:55 AM

## 2017-12-30 NOTE — ED Provider Notes (Signed)
[facility] DEPT Provider Note   CSN: [id number] Arrival date & time: 12/29/17  4967     History   Chief Complaint Chief Complaint  Patient presents with  . Abdominal Pain    HPI [name] is a 42 y.o. female presenting for evaluation of abdominal pain.  Patient states that she had acute onset abdominal pain today.  The pain is generalized, constant, and severe.  Additionally, patient reports nausea and vomiting for the past 2 weeks.  She reports hematemesis times 4 days.  She reports last oral intake was soup 4 days ago.  She reports last bowel movement was 3 days ago.  She denies, fever, chills, chest pain, shortness of breath, or urinary symptoms.  She states she has not been taking her medication as prescribed, has not followed up regarding her viral loads her HIV status.   HPI  Past Medical History:  Diagnosis Date  . Acute psychosis ([place]) 03/10/12   2nd admission in last wk for this  . Angio-edema   . Anxiety   . Asthma    inhaler 2xday  . Bipolar disorder ([place])   . Depression   . GERD (gastroesophageal reflux disease)   . Hepatitis B    /E-chart  . History of blood transfusion   . HIV positive ([place])   . Microcytic anemia    h/o per E-chart  . Noncompliance with medication regimen    /e-chart  . 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 Pyelonephritis    h/o per E-chart  . Shortness of breath dyspnea    due to Asthma  . Thyroid disease   . Urticaria     Patient Active Problem List   Diagnosis Date Noted  . Biliary dyskinesia 10/16/2017  . Nausea and vomiting 10/14/2017  . Anemia 10/13/2017  . Nausea & vomiting 10/13/2017  . Thrombocytopenia ([place]) 07/31/2017  . Neutropenia with fever ([place]) 07/30/2017  . Acalculous cholecystitis 07/30/2017  . Bacteremia due to other bacteria 07/30/2017  . Neutropenic fever ([place]) 07/30/2017  . Acute kidney injury (nontraumatic) ([place])   .  AKI (acute kidney injury) ([place])   . Abdominal pain 07/29/2017  . Acute renal failure (ARF) ([place]) 07/29/2017  . Leukopenia 07/29/2017  . Severe protein-calorie malnutrition ([place]) 10/26/2016  . Status post thyroidectomy 10/25/2016  . Asthma 10/25/2016  . 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 GERD (gastroesophageal reflux disease) 10/25/2016  . Candidal esophagitis ([place]) 10/25/2016  . Intractable vomiting with nausea 10/24/2016  . Normal anion gap metabolic acidosis 59/16/3846  . Dysphagia 09/11/2015  . Noncompliance with medication regimen   . Esophageal stricture 12/06/2014  . 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 Hypokalemia 07/09/2013  . Bipolar disorder ([place]) 04/01/2012  . HIV disease ([place]) 02/28/2009  . Genital herpes 02/28/2009  . Microcytic anemia 02/27/2009    Past Surgical History:  Procedure Laterality Date  . BALLOON DILATION N/A 02/14/2015   Procedure: BALLOON DILATION;  Surgeon: Lear Ng, MD;  Location: [facility] ENDOSCOPY;  Service: Endoscopy;  Laterality: N/A;  . BALLOON DILATION N/A 10/25/2016   Procedure: BALLOON DILATION;  Surgeon: Wilford Corner, MD;  Location: [facility] ENDOSCOPY;  Service: Endoscopy;  Laterality: N/A;  . BALLOON DILATION N/A 01/14/2017   Procedure: BALLOON DILATION;  Surgeon: Wilford Corner, MD;  Location: [facility] ENDOSCOPY;  Service: Endoscopy;  Laterality: N/A;  . BALLOON DILATION N/A 04/30/2017   Procedure: BALLOON DILATION;  Surgeon: Wilford Corner, MD;  Location: [facility];  Service:  Endoscopy;  Laterality: N/A;  . CHOLECYSTECTOMY    . CHOLECYSTECTOMY N/A 10/15/2017   Procedure: LAPAROSCOPIC CHOLECYSTECTOMY WITH INTRAOPERATIVE CHOLANGIOGRAM;  Surgeon: Coralie Keens, MD;  Location: [facility];  Service: General;  Laterality: N/A;  . ESOPHAGOGASTRODUODENOSCOPY N/A 07/13/2013   Procedure: ESOPHAGOGASTRODUODENOSCOPY (EGD);  Surgeon: Beryle Beams, MD;  Location: [facility] ENDOSCOPY;  Service: Endoscopy;  Laterality: N/A;  .  ESOPHAGOGASTRODUODENOSCOPY N/A 08/12/2014   Procedure: ESOPHAGOGASTRODUODENOSCOPY (EGD);  Surgeon: Milus Banister, MD;  Location: [facility] ENDOSCOPY;  Service: Endoscopy;  Laterality: N/A;  . ESOPHAGOGASTRODUODENOSCOPY N/A 02/14/2015   Procedure: ESOPHAGOGASTRODUODENOSCOPY (EGD);  Surgeon: Lear Ng, MD;  Location: [facility] ENDOSCOPY;  Service: Endoscopy;  Laterality: N/A;  . ESOPHAGOGASTRODUODENOSCOPY N/A 10/25/2016   Procedure: ESOPHAGOGASTRODUODENOSCOPY (EGD);  Surgeon: Wilford Corner, MD;  Location: [facility] ENDOSCOPY;  Service: Endoscopy;  Laterality: N/A;  . ESOPHAGOGASTRODUODENOSCOPY (EGD) WITH PROPOFOL N/A 04/11/2015   Procedure: ESOPHAGOGASTRODUODENOSCOPY (EGD) WITH PROPOFOL;  Surgeon: Wilford Corner, MD;  Location: [facility] ENDOSCOPY;  Service: Endoscopy;  Laterality: N/A;  . ESOPHAGOGASTRODUODENOSCOPY (EGD) WITH PROPOFOL N/A 09/11/2015   Procedure: ESOPHAGOGASTRODUODENOSCOPY (EGD) WITH PROPOFOL;  Surgeon: Wilford Corner, MD;  Location: [facility] ENDOSCOPY;  Service: Endoscopy;  Laterality: N/A;  . ESOPHAGOGASTRODUODENOSCOPY (EGD) WITH PROPOFOL N/A 01/14/2017   Procedure: ESOPHAGOGASTRODUODENOSCOPY (EGD) WITH PROPOFOL;  Surgeon: Wilford Corner, MD;  Location: [facility] ENDOSCOPY;  Service: Endoscopy;  Laterality: N/A;  . ESOPHAGOGASTRODUODENOSCOPY (EGD) WITH PROPOFOL N/A 04/30/2017   Procedure: ESOPHAGOGASTRODUODENOSCOPY (EGD) WITH PROPOFOL;  Surgeon: Wilford Corner, MD;  Location: [facility];  Service: Endoscopy;  Laterality: N/A;  . SAVORY DILATION N/A 02/14/2015   Procedure: SAVORY DILATION;  Surgeon: Lear Ng, MD;  Location: [facility];  Service: Endoscopy;  Laterality: N/A;  no xray needed  . SAVORY DILATION N/A 04/11/2015   Procedure: SAVORY DILATION;  Surgeon: Wilford Corner, MD;  Location: [facility] ENDOSCOPY;  Service: Endoscopy;  Laterality: N/A;  . SAVORY DILATION N/A 09/11/2015   Procedure: SAVORY DILATION;  Surgeon: Wilford Corner, MD;  Location: [facility] ENDOSCOPY;  Service: Endoscopy;   Laterality: N/A;  . SAVORY DILATION N/A 04/30/2017   Procedure: SAVORY DILATION;  Surgeon: Wilford Corner, MD;  Location: [place];  Service: Endoscopy;  Laterality: N/A;  . THYROID LOBECTOMY  08/2002   left & isthmectomy; for benign thyroid adenoma/E-chart  . THYROID SURGERY    . TUBAL LIGATION  07/2002   /E-chart    OB History    No data available       Home Medications    Prior to Admission medications   Medication Sig Start Date End Date Taking? Authorizing Provider  albuterol (PROAIR HFA) 108 (90 BASE) MCG/ACT inhaler Inhale 2 puffs into the lungs every 4 (four) hours as needed for wheezing or shortness of breath (cough). 11/16/15  Yes Gean Quint, MD  benztropine (COGENTIN) 1 MG tablet Take 1 tablet (1 mg total) by mouth daily. 11/12/16  Yes Michel Bickers, MD  dapsone 100 MG tablet Take 100 mg by mouth daily.   Yes [provider]  bictegravir-emtricitabine-tenofovir AF (BIKTARVY) 50-200-25 MG TABS tablet Take 1 tablet daily by mouth. Patient not taking: Reported on 12/29/2017 10/24/17   Kayleen Memos, DO  fluconazole (DIFLUCAN) 200 MG tablet Take 1 tablet (200 mg total) daily by mouth. Patient not taking: Reported on 12/29/2017 10/25/17   Kayleen Memos, DO  OLANZapine (ZYPREXA) 5 MG tablet Take 1 tablet (5 mg total) by mouth at bedtime. 11/12/16   Michel Bickers, MD  ondansetron (ZOFRAN ODT) 8 MG disintegrating tablet Take 1 tablet (8  mg total) by mouth every 8 (eight) hours as needed for nausea or vomiting. Patient not taking: Reported on 12/29/2017 09/03/17   Carlyle Basques, MD  pantoprazole (PROTONIX) 40 MG tablet Take 1 tablet (40 mg total) by mouth daily. Patient not taking: Reported on 12/29/2017 11/12/16   Michel Bickers, MD  traMADol (ULTRAM) 50 MG tablet Take 1 tablet (50 mg total) by mouth every 6 (six) hours as needed for moderate pain. Patient not taking: Reported on 12/29/2017 08/05/17   Charlynne Cousins, MD    Family History Family History    Problem Relation Age of Onset  . Cancer Mother   . Diabetes Mother   . Diabetes Father   . Heart disease Father   . Diabetes Sister   . Urticaria Sister   . Asthma Son   . Allergic rhinitis Neg Hx   . Eczema Neg Hx   . Immunodeficiency Neg Hx     Social History Social History   Tobacco Use  . Smoking status: Never Smoker  . Smokeless tobacco: Never Used  Substance Use Topics  . Alcohol use: No    Alcohol/week: 1.2 oz    Types: 2 Glasses of wine per week  . Drug use: No     Allergies   Other; Peanut-containing drug products; Shellfish allergy; Bactrim; Orange fruit; and Sulfa antibiotics   Review of Systems Review of Systems  Gastrointestinal: Positive for abdominal pain, constipation, nausea and vomiting.  Allergic/Immunologic: Positive for immunocompromised state.  All other systems reviewed and are negative.    Physical Exam Updated Vital Signs BP 127/85   Pulse 83   Temp 98 F (36.7 C) (Oral)   Resp 18   Ht 6' 1"  (1.854 m)   Wt 43.1 kg (95 lb)   LMP  (LMP Unknown) Comment: negative beta HCG 12/29/17  SpO2 100%   BMI 12.53 kg/m   Physical Exam  Constitutional: She is oriented to person, place, and time. She appears well-developed.  Patient is cachectic and chronically ill-appearing. Clinically dry.  HENT:  Head: Normocephalic and atraumatic.  Mouth/Throat: Mucous membranes are dry.  Eyes: Conjunctivae and EOM are normal. Pupils are equal, round, and reactive to light. Scleral icterus is present.  Neck: Normal range of motion.  Cardiovascular: Regular rhythm and intact distal pulses.  Mildly tachcyardic 100-105  Pulmonary/Chest: Effort normal and breath sounds normal. No respiratory distress. She has no wheezes.  Abdominal: Soft. She exhibits no distension and no mass. There is tenderness. There is no rebound and no guarding.  Generalized TTP. No rigidity.   Musculoskeletal: Normal range of motion.  Neurological: She is alert and oriented to  person, place, and time.  Skin: Skin is warm and dry.  Psychiatric: She has a normal mood and affect.  Nursing note and vitals reviewed.    ED Treatments / Results  Labs (all labs ordered are listed, but only abnormal results are displayed) Labs Reviewed  LIPASE, BLOOD - Abnormal; Notable for the following components:      Result Value   Lipase 1,305 (*)    All other components within normal limits  COMPREHENSIVE METABOLIC PANEL - Abnormal; Notable for the following components:   Potassium 2.3 (*)    Glucose, Bld 169 (*)    Albumin 2.6 (*)    AST 161 (*)    Alkaline Phosphatase 944 (*)    Total Bilirubin 14.7 (*)    All other components within normal limits  CBC - Abnormal; Notable for the following components:  WBC 3.3 (*)    RBC 3.81 (*)    Hemoglobin 9.6 (*)    HCT 28.9 (*)    MCV 75.9 (*)    MCH 25.2 (*)    RDW 21.1 (*)    Platelets 38 (*)    All other components within normal limits  URINALYSIS, ROUTINE W REFLEX MICROSCOPIC  RAPID URINE DRUG SCREEN, HOSP PERFORMED  I-STAT BETA HCG BLOOD, ED ([facility], [facility], [facility] ONLY)    EKG  EKG Interpretation None       Radiology No results found.  Procedures Procedures (including critical care time)  Medications Ordered in ED Medications  potassium chloride 10 mEq in 100 mL IVPB (10 mEq Intravenous New Bag/Given 12/30/17 0142)  iopamidol (ISOVUE-300) 61 % injection 100 mL (not administered)  iopamidol (ISOVUE-300) 61 % injection (not administered)  iopamidol (ISOVUE-300) 61 % injection (not administered)  iopamidol (ISOVUE-300) 61 % injection (not administered)  sodium chloride 0.9 % bolus 1,000 mL (0 mLs Intravenous Stopped 12/30/17 0014)  ondansetron (ZOFRAN) injection 4 mg (4 mg Intravenous Given 12/29/17 2303)  morphine 4 MG/ML injection 4 mg (4 mg Intravenous Given 12/29/17 2304)  fentaNYL (SUBLIMAZE) injection 25 mcg (25 mcg Intravenous Given 12/30/17 0030)  morphine 4 MG/ML injection 4 mg (4 mg Intravenous Given 12/30/17  0143)     Initial Impression / Assessment and Plan / ED Course  I have reviewed the triage vital signs and the nursing notes.  Pertinent labs & imaging results that were available during my care of the patient were reviewed by me and considered in my medical decision making (see chart for details).     Patient presenting for evaluation of abdominal pain, nausea, vomiting.  Physical exam shows patient is mildly tachycardic and appears very dry.  She is cachectic and chronically ill-appearing.  Eyes are jaundiced.  Tenderness to palpation of abdomen without distention or rigidity.  Labs show elevated lipase at 1300.  White count 3.3.  Hemoglobin stable at 9.6.  Platelets 38.  Potassium low at 2.3.  Alk phos elevated at 944, bili elevated at 14.7.  Creatinine stable.  Will give morphine and Zofran for symptom control.  Bolus fluids.  IV Potassium started (x2 runs).  CT abdomen for further evaluation.  On reassessment, patient reports morphine has not relieved her pain.  Will give small dose of fentanyl.  CT pending.  Pt signed out to [place], PA-C while CT pending.  Plan for admission due to pancreatitis and elevated liver enzymes.   Final Clinical Impressions(s) / ED Diagnoses   Final diagnoses:  None    ED Discharge Orders    None       Franchot Heidelberg, PA-C 12/30/17 0144    Davonna Belling, MD 12/30/17 1451

## 2017-12-30 NOTE — ED Notes (Signed)
ED TO INPATIENT HANDOFF REPORT  Name/Age/Gender [name] 42 y.o. female  Code Status    Code Status Orders  (From admission, onward)        Start     Ordered   12/30/17 0934  Full code  Continuous     12/30/17 0933    Code Status History    Date Active Date Inactive Code Status Order ID Comments User Context   10/13/2017 10:06 10/24/2017 22:31 Full Code [id number]  Jani Gravel, MD ED   07/29/2017 08:46 08/05/2017 23:02 Full Code [id number]  Caren Griffins, MD Inpatient   10/23/2016 22:55 10/28/2016 18:40 Full Code [id number]  Edwin Dada, MD Inpatient   12/06/2014 01:13 12/12/2014 17:25 Full Code [id number]  Waldemar Dickens, MD Inpatient   08/10/2014 13:58 08/20/2014 17:10 Full Code [id number]  Theodis Blaze, MD Inpatient   12/27/2013 22:14 12/29/2013 16:57 Full Code [id number]  Annita Brod, MD Inpatient   07/09/2013 16:31 07/16/2013 13:54 Full Code [id number]  Theodis Blaze, MD Inpatient   03/05/2012 17:35 03/08/2012 18:56 Full Code [id number]  Delora Fuel, MD ED      Home/SNF/Other Home  Chief Complaint abdominal pains  Level of Care/Admitting Diagnosis ED Disposition    ED Disposition Condition [place]: [facility] [[id number]]  Level of Care: Telemetry [5]  Admit to tele based on following criteria: Complex arrhythmia (Bradycardia/Tachycardia)  Diagnosis: Pancreatitis [568127]  Admitting Physician: Norval Morton [[id number]]  Attending Physician: Norval Morton [[id number]]  Estimated length of stay: past midnight tomorrow  Certification:: I certify this patient will need inpatient services for at least 2 midnights  PT Class (Do Not Modify): Inpatient [101]  PT Acc Code (Do Not Modify): Private [1]       Medical History Past Medical History:  Diagnosis Date  . Acute psychosis ([place]) 03/10/12   2nd admission in last wk for this  . Angio-edema   . Anxiety   . Asthma    inhaler 2xday  . Bipolar disorder ([place])    . Depression   . GERD (gastroesophageal reflux disease)   . Hepatitis B    /E-chart  . History of blood transfusion   . HIV positive ([place])   . Microcytic anemia    h/o per E-chart  . Noncompliance with medication regimen    /e-chart  . 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 Pyelonephritis    h/o per E-chart  . Shortness of breath dyspnea    due to Asthma  . Thyroid disease   . Urticaria     Allergies Allergies  Allergen Reactions  . Other Hives and Itching    Berries, TREE NUTS  . Peanut-Containing Drug Products Hives  . Shellfish Allergy Hives  . Bactrim Itching  . Orange Fruit Itching  . Sulfa Antibiotics Itching    IV Location/Drains/Wounds Patient Lines/Drains/Airways Status   Active Line/Drains/Airways    Name:   Placement date:   Placement time:   Site:   Days:   Peripheral IV 12/29/17 Right;Upper Arm   12/29/17    2235    Arm   1   Peripheral IV 12/30/17 Left;Anterior Antecubital   12/30/17    0521    Antecubital   less than 1   Incision (Closed) 10/15/17 Abdomen Other (Comment)   10/15/17    1200     76   Incision - 4 Ports Abdomen 1: Umbilicus 2: Mid;Upper  3: Right;Lateral 4: Medial;Mid   10/15/17    1205     76   Wound / Incision (Open or Dehisced) 07/29/17 Non-pressure wound Vagina Lower nondraining open   07/29/17    1000    Vagina   154          Labs/Imaging Results for orders placed or performed during the hospital encounter of 12/29/17 (from the past 48 hour(s))  Lipase, blood     Status: Abnormal   Collection Time: 12/29/17  7:40 PM  Result Value Ref Range   Lipase 1,305 (H) 11 - 51 U/L    Comment: RESULTS CONFIRMED BY MANUAL DILUTION  Comprehensive metabolic panel     Status: Abnormal   Collection Time: 12/29/17  7:40 PM  Result Value Ref Range   Sodium 139 135 - 145 mmol/L   Potassium 2.3 (LL) 3.5 - 5.1 mmol/L    Comment: CRITICAL RESULT CALLED TO, READ BACK BY AND VERIFIED WITH: J [facility] RN  2030 12/29/17 A NAVARRO    Chloride 101 101 - 111 mmol/L   CO2 25 22 - 32 mmol/L   Glucose, Bld 169 (H) 65 - 99 mg/dL   BUN 19 6 - 20 mg/dL   Creatinine, Ser 0.52 0.44 - 1.00 mg/dL   Calcium 9.1 8.9 - 10.3 mg/dL   Total Protein 7.1 6.5 - 8.1 g/dL   Albumin 2.6 (L) 3.5 - 5.0 g/dL   AST 161 (H) 15 - 41 U/L   ALT 37 14 - 54 U/L   Alkaline Phosphatase 944 (H) 38 - 126 U/L   Total Bilirubin 14.7 (H) 0.3 - 1.2 mg/dL   GFR calc non Af Amer >60 >60 mL/min   GFR calc Af Amer >60 >60 mL/min    Comment: (NOTE) The eGFR has been calculated using the CKD EPI equation. This calculation has not been validated in all clinical situations. eGFR's persistently <60 mL/min signify possible Chronic Kidney Disease.    Anion gap 13 5 - 15  CBC     Status: Abnormal   Collection Time: 12/29/17  7:40 PM  Result Value Ref Range   WBC 3.3 (L) 4.0 - 10.5 K/uL   RBC 3.81 (L) 3.87 - 5.11 MIL/uL   Hemoglobin 9.6 (L) 12.0 - 15.0 g/dL   HCT 28.9 (L) 36.0 - 46.0 %   MCV 75.9 (L) 78.0 - 100.0 fL   MCH 25.2 (L) 26.0 - 34.0 pg   MCHC 33.2 30.0 - 36.0 g/dL   RDW 21.1 (H) 11.5 - 15.5 %   Platelets 38 (L) 150 - 400 K/uL    Comment: REPEATED TO VERIFY SPECIMEN CHECKED FOR CLOTS PLATELET COUNT CONFIRMED BY SMEAR   I-Stat beta hCG blood, ED     Status: None   Collection Time: 12/29/17  7:54 PM  Result Value Ref Range   I-stat hCG, quantitative <5.0 <5 mIU/mL   Comment 3            Comment:   GEST. AGE      CONC.  (mIU/mL)   <=1 WEEK        5 - 50     2 WEEKS       50 - 500     3 WEEKS       100 - 10,000     4 WEEKS     1,000 - 30,000        FEMALE AND NON-PREGNANT FEMALE:     LESS THAN 5 mIU/mL  Urinalysis, Routine w reflex microscopic     Status: Abnormal   Collection Time: 12/30/17  3:50 AM  Result Value Ref Range   Color, Urine AMBER (A) YELLOW    Comment: BIOCHEMICALS MAY BE AFFECTED BY COLOR   APPearance CLEAR CLEAR   Specific Gravity, Urine >1.046 (H) 1.005 - 1.030   pH 6.0 5.0 - 8.0   Glucose,  UA NEGATIVE NEGATIVE mg/dL   Hgb urine dipstick NEGATIVE NEGATIVE   Bilirubin Urine MODERATE (A) NEGATIVE   Ketones, ur NEGATIVE NEGATIVE mg/dL   Protein, ur NEGATIVE NEGATIVE mg/dL   Nitrite NEGATIVE NEGATIVE   Leukocytes, UA NEGATIVE NEGATIVE  Rapid urine drug screen (hospital performed)     Status: Abnormal   Collection Time: 12/30/17  3:50 AM  Result Value Ref Range   Opiates POSITIVE (A) NONE DETECTED   Cocaine NONE DETECTED NONE DETECTED   Benzodiazepines NONE DETECTED NONE DETECTED   Amphetamines NONE DETECTED NONE DETECTED   Tetrahydrocannabinol NONE DETECTED NONE DETECTED   Barbiturates NONE DETECTED NONE DETECTED    Comment: (NOTE) DRUG SCREEN FOR MEDICAL PURPOSES ONLY.  IF CONFIRMATION IS NEEDED FOR ANY PURPOSE, NOTIFY LAB WITHIN 5 DAYS. LOWEST DETECTABLE LIMITS FOR URINE DRUG SCREEN Drug Class                     Cutoff (ng/mL) Amphetamine and metabolites    1000 Barbiturate and metabolites    200 Benzodiazepine                 782 Tricyclics and metabolites     300 Opiates and metabolites        300 Cocaine and metabolites        300 THC                            50   Magnesium     Status: None   Collection Time: 12/30/17  7:00 AM  Result Value Ref Range   Magnesium 2.0 1.7 - 2.4 mg/dL  Basic metabolic panel     Status: Abnormal   Collection Time: 12/30/17 10:03 AM  Result Value Ref Range   Sodium 142 135 - 145 mmol/L   Potassium 2.9 (L) 3.5 - 5.1 mmol/L    Comment: DELTA CHECK NOTED REPEATED TO VERIFY NO VISIBLE HEMOLYSIS    Chloride 110 101 - 111 mmol/L   CO2 24 22 - 32 mmol/L   Glucose, Bld 117 (H) 65 - 99 mg/dL   BUN 18 6 - 20 mg/dL   Creatinine, Ser 0.54 0.44 - 1.00 mg/dL   Calcium 8.0 (L) 8.9 - 10.3 mg/dL   GFR calc non Af Amer >60 >60 mL/min   GFR calc Af Amer >60 >60 mL/min    Comment: (NOTE) The eGFR has been calculated using the CKD EPI equation. This calculation has not been validated in all clinical situations. eGFR's persistently <60  mL/min signify possible Chronic Kidney Disease.    Anion gap 8 5 - 15  Triglycerides     Status: Abnormal   Collection Time: 12/30/17 11:18 AM  Result Value Ref Range   Triglycerides 400 (H) <150 mg/dL   Ct Abdomen Pelvis W Contrast  Result Date: 12/30/2017 CLINICAL DATA:  Generalized abdominal pain, vomiting and hematuria. HIV positive. Elevated lipase. Prior cholecystectomy. EXAM: CT ABDOMEN AND PELVIS WITH CONTRAST TECHNIQUE: Multidetector CT imaging of the abdomen and pelvis was performed using the standard protocol following bolus administration  of intravenous contrast. CONTRAST:  80 cc Isovue-300 IV. COMPARISON:  10/21/2017 CT abdomen/pelvis. FINDINGS: Lower chest: No significant pulmonary nodules or acute consolidative airspace disease. Hepatobiliary: Normal liver size. No liver mass. Cholecystectomy. No biliary ductal dilatation. Common bile duct diameter 5 mm. Pancreas: There is acute diffuse pancreatic enlargement with prominent pancreatic and peripancreatic edema with ill-defined peripancreatic fluid extending into the bilateral anterior paranephric retroperitoneal spaces and into the lesser sac, compatible with acute pancreatitis. No regions of pancreatic parenchymal nonenhancement or gas. No pancreatic mass or duct dilation. No measurable peripancreatic fluid collections. Spleen: Normal size. No mass. Adrenals/Urinary Tract: Normal adrenals. Normal kidneys with no hydronephrosis and no renal mass. Normal bladder. Stomach/Bowel: Normal non-distended stomach. Normal caliber small bowel with no small bowel wall thickening. Appendix. Normal large bowel with no diverticulosis, large bowel wall thickening or pericolonic fat stranding. Vascular/Lymphatic: Normal caliber abdominal aorta. Patent portal, splenic, hepatic and renal veins. Stable mildly enlarged 1.0 cm right retrocrural node (series 2/image 15). Stable mildly enlarged 1.2 cm porta hepatis node (series 2/image 22). Stable mild left  para-aortic adenopathy measuring up to 1.4 cm (series 2/image 31). No new pathologically enlarged abdominopelvic nodes. Reproductive: Grossly normal uterus.  No adnexal mass. Other: Small volume ascites. No pneumoperitoneum. No focal fluid collection. Musculoskeletal: No aggressive appearing focal osseous lesions. IMPRESSION: 1. Acute pancreatitis diffusely involving the pancreas, without complication. 2. Small volume ascites. 3. Chronic porta hepatis and retroperitoneal adenopathy is stable and probably reactive. Electronically Signed   By: Ilona Sorrel M.D.   On: 12/30/2017 02:45    Pending Labs Unresulted Labs (From admission, onward)   Start     Ordered   12/31/17 0500  CBC  Tomorrow morning,   R     12/30/17 0933   12/31/17 0500  Lipase, blood  Tomorrow morning,   R     12/30/17 1107   12/31/17 0500  Comprehensive metabolic panel  Tomorrow morning,   R     12/30/17 1111   12/30/17 1131  Potassium  STAT,   STAT     12/30/17 1130      Vitals/Pain Today's Vitals   12/30/17 1400 12/30/17 1430 12/30/17 1500 12/30/17 1530  BP: 113/84 118/85 119/89 114/78  Pulse: 84 86 85 94  Resp: 11 10 14 17   Temp:      TempSrc:      SpO2: 100% 100%  100%  Weight:      Height:      PainSc:        Isolation Precautions No active isolations  Medications Medications  iopamidol (ISOVUE-300) 61 % injection (not administered)  iopamidol (ISOVUE-300) 61 % injection (not administered)  morphine 2 MG/ML injection 2 mg (2 mg Intravenous Given 12/30/17 1104)  albuterol (PROVENTIL HFA;VENTOLIN HFA) 108 (90 Base) MCG/ACT inhaler 2 puff (not administered)  OLANZapine (ZYPREXA) tablet 5 mg (not administered)  benztropine (COGENTIN) tablet 1 mg (1 mg Oral Given 12/30/17 0958)  bictegravir-emtricitabine-tenofovir AF (BIKTARVY) 50-200-25 MG per tablet 1 tablet (1 tablet Oral Given 12/30/17 0958)  0.9 %  sodium chloride infusion ( Intravenous New Bag/Given 12/30/17 1002)  ondansetron (ZOFRAN) tablet 4 mg ( Oral  See Alternative 12/30/17 0958)    Or  ondansetron (ZOFRAN) injection 4 mg (4 mg Intravenous Given 12/30/17 0958)  HYDROcodone-acetaminophen (NORCO/VICODIN) 5-325 MG per tablet 1-2 tablet (not administered)  potassium chloride 10 mEq in 100 mL IVPB (0 mEq Intravenous Stopped 12/30/17 1529)  fluconazole (DIFLUCAN) IVPB 400 mg (not administered)  potassium chloride 10 mEq  in 100 mL IVPB (0 mEq Intravenous Stopped 12/30/17 0300)  sodium chloride 0.9 % bolus 1,000 mL (0 mLs Intravenous Stopped 12/30/17 0014)  ondansetron (ZOFRAN) injection 4 mg (4 mg Intravenous Given 12/29/17 2303)  morphine 4 MG/ML injection 4 mg (4 mg Intravenous Given 12/29/17 2304)  iopamidol (ISOVUE-300) 61 % injection 100 mL (80 mLs Intravenous Contrast Given 12/30/17 0152)  fentaNYL (SUBLIMAZE) injection 25 mcg (25 mcg Intravenous Given 12/30/17 0030)  morphine 4 MG/ML injection 4 mg (4 mg Intravenous Given 12/30/17 0143)  fluconazole (DIFLUCAN) IVPB 200 mg (0 mg Intravenous Stopped 12/30/17 1220)    Mobility walks with person assist

## 2017-12-30 NOTE — Plan of Care (Signed)
Discussed plan of care with Madison Lange, PA-C.  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  Labs revealed WBC 3.3, hemoglobin 9.6, platelets 38, potassium 2.3,  lipase 1305, and elevated LFTs.  CT scan of the abdomen showed uncomplicated pancreatitis.  Patient was given morphine for pain, 30 mEq of potassium chloride, and 1 L of normal saline IV fluids.  TRH called to admit.  Admitted as inpatient to a telemetry bed.  Continued IV fluids at 125 mL/h, ordered additional 40 mEq of potassium chloride, and continued IV morphine as needed pain.

## 2017-12-30 NOTE — Care Management Note (Signed)
Case Management Note  CM consulted for possible HHS on D/C home from the hospital.  CM noted that pt currently has no ins.  Spoke with pt who states she has Medicaid.  CM confirmed with registration that pt's Medicaid is verified as inactive.  Advised pt to contact her DSS worker and that getting HHS with Medicaid only allows for 5 visits for an entire year.  Advised her to speak with her DSS worker about P[employer] which is a separate application to see if she would qualify for assistance.  Pt acknowledged understanding.  No further CM needs noted at this time.  Inpt CM will be available as needed.

## 2017-12-30 NOTE — H&P (Signed)
History and Physical    LOVEAH LIKE ZOX:[id number] DOB: 05-Oct-1976 DOA: 12/29/2017  PCP: Nolene Ebbs, MD  Patient coming from: Home  Chief Complaint:   Abdominal pain  HPI: [name] is a 42 y.o. female with medical history significant of HIV AIDS, Candida esophagitis, GERD, bipolar disorder, asthma, who comes in for 2 weeks of progressive worsening epigastric abdominal pain.  Patient has deteriorating for months losing a significant amount of weight.  She weighed 170 pounds less than a year ago.  She is down to 95 pounds.  She reports she is compliant with her HIV medications.  She reports that she has been having dysphagia for months.  She was given oral Diflucan however she cannot swallow the pills.  She then called ID office for liquid format which looks like was ordered by ID but patient never received this.  She reports she has been able to pure her food and able to hold that down.  She cannot eat solids without meat getting stuck.  She denies any fevers.  She does frequently vomit depending on what she eats.  No diarrhea.  No cough.  Patient is extremely cachectic.  Her abdominal pain has been getting worse and is worse after eating.  Sometimes she goes days without eating.  Patient found to have pancreatitis with a lipase level over 1000.  She is referred for admission for acute pancreatitis.    Review of Systems: As per HPI otherwise 10 point review of systems negative.   Past Medical History:  Diagnosis Date  . Acute psychosis ([place]) 03/10/12   2nd admission in last wk for this  . Angio-edema   . Anxiety   . Asthma    inhaler 2xday  . Bipolar disorder ([place])   . Depression   . GERD (gastroesophageal reflux disease)   . Hepatitis B    /E-chart  . History of blood transfusion   . HIV positive ([place])   . Microcytic anemia    h/o per E-chart  . Noncompliance with medication regimen    /e-chart  . 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 Pyelonephritis    h/o per E-chart  . Shortness of breath dyspnea    due to Asthma  . Thyroid disease   . Urticaria     Past Surgical History:  Procedure Laterality Date  . BALLOON DILATION N/A 02/14/2015   Procedure: BALLOON DILATION;  Surgeon: Lear Ng, MD;  Location: [facility] ENDOSCOPY;  Service: Endoscopy;  Laterality: N/A;  . BALLOON DILATION N/A 10/25/2016   Procedure: BALLOON DILATION;  Surgeon: Wilford Corner, MD;  Location: [facility] ENDOSCOPY;  Service: Endoscopy;  Laterality: N/A;  . BALLOON DILATION N/A 01/14/2017   Procedure: BALLOON DILATION;  Surgeon: Wilford Corner, MD;  Location: [facility] ENDOSCOPY;  Service: Endoscopy;  Laterality: N/A;  . BALLOON DILATION N/A 04/30/2017   Procedure: BALLOON DILATION;  Surgeon: Wilford Corner, MD;  Location: [facility] ENDOSCOPY;  Service: Endoscopy;  Laterality: N/A;  . CHOLECYSTECTOMY    . CHOLECYSTECTOMY N/A 10/15/2017   Procedure: LAPAROSCOPIC CHOLECYSTECTOMY WITH INTRAOPERATIVE CHOLANGIOGRAM;  Surgeon: Coralie Keens, MD;  Location: [place];  Service: General;  Laterality: N/A;  . ESOPHAGOGASTRODUODENOSCOPY N/A 07/13/2013   Procedure: ESOPHAGOGASTRODUODENOSCOPY (EGD);  Surgeon: Beryle Beams, MD;  Location: [facility] ENDOSCOPY;  Service: Endoscopy;  Laterality: N/A;  . ESOPHAGOGASTRODUODENOSCOPY N/A 08/12/2014   Procedure: ESOPHAGOGASTRODUODENOSCOPY (EGD);  Surgeon: Milus Banister, MD;  Location: [facility] ENDOSCOPY;  Service: Endoscopy;  Laterality: N/A;  .  ESOPHAGOGASTRODUODENOSCOPY N/A 02/14/2015   Procedure: ESOPHAGOGASTRODUODENOSCOPY (EGD);  Surgeon: Lear Ng, MD;  Location: [facility] ENDOSCOPY;  Service: Endoscopy;  Laterality: N/A;  . ESOPHAGOGASTRODUODENOSCOPY N/A 10/25/2016   Procedure: ESOPHAGOGASTRODUODENOSCOPY (EGD);  Surgeon: Wilford Corner, MD;  Location: [facility] ENDOSCOPY;  Service: Endoscopy;  Laterality: N/A;  . ESOPHAGOGASTRODUODENOSCOPY (EGD) WITH PROPOFOL N/A 04/11/2015   Procedure: ESOPHAGOGASTRODUODENOSCOPY (EGD) WITH  PROPOFOL;  Surgeon: Wilford Corner, MD;  Location: [facility] ENDOSCOPY;  Service: Endoscopy;  Laterality: N/A;  . ESOPHAGOGASTRODUODENOSCOPY (EGD) WITH PROPOFOL N/A 09/11/2015   Procedure: ESOPHAGOGASTRODUODENOSCOPY (EGD) WITH PROPOFOL;  Surgeon: Wilford Corner, MD;  Location: [facility] ENDOSCOPY;  Service: Endoscopy;  Laterality: N/A;  . ESOPHAGOGASTRODUODENOSCOPY (EGD) WITH PROPOFOL N/A 01/14/2017   Procedure: ESOPHAGOGASTRODUODENOSCOPY (EGD) WITH PROPOFOL;  Surgeon: Wilford Corner, MD;  Location: [facility] ENDOSCOPY;  Service: Endoscopy;  Laterality: N/A;  . ESOPHAGOGASTRODUODENOSCOPY (EGD) WITH PROPOFOL N/A 04/30/2017   Procedure: ESOPHAGOGASTRODUODENOSCOPY (EGD) WITH PROPOFOL;  Surgeon: Wilford Corner, MD;  Location: [place];  Service: Endoscopy;  Laterality: N/A;  . SAVORY DILATION N/A 02/14/2015   Procedure: SAVORY DILATION;  Surgeon: Lear Ng, MD;  Location: [place];  Service: Endoscopy;  Laterality: N/A;  no xray needed  . SAVORY DILATION N/A 04/11/2015   Procedure: SAVORY DILATION;  Surgeon: Wilford Corner, MD;  Location: [facility] ENDOSCOPY;  Service: Endoscopy;  Laterality: N/A;  . SAVORY DILATION N/A 09/11/2015   Procedure: SAVORY DILATION;  Surgeon: Wilford Corner, MD;  Location: [facility] ENDOSCOPY;  Service: Endoscopy;  Laterality: N/A;  . SAVORY DILATION N/A 04/30/2017   Procedure: SAVORY DILATION;  Surgeon: Wilford Corner, MD;  Location: [place];  Service: Endoscopy;  Laterality: N/A;  . THYROID LOBECTOMY  08/2002   left & isthmectomy; for benign thyroid adenoma/E-chart  . THYROID SURGERY    . TUBAL LIGATION  07/2002   /E-chart     reports that  has never smoked. she has never used smokeless tobacco. She reports that she does not drink alcohol or use drugs.  Allergies  Allergen Reactions  . Other Hives and Itching    Berries, TREE NUTS  . Peanut-Containing Drug Products Hives  . Shellfish Allergy Hives  . Bactrim Itching  . Orange Fruit Itching  . Sulfa Antibiotics  Itching    Family History  Problem Relation Age of Onset  . Cancer Mother   . Diabetes Mother   . Diabetes Father   . Heart disease Father   . Diabetes Sister   . Urticaria Sister   . Asthma Son   . Allergic rhinitis Neg Hx   . Eczema Neg Hx   . Immunodeficiency Neg Hx     Prior to Admission medications   Medication Sig Start Date End Date Taking? Authorizing Provider  albuterol (PROAIR HFA) 108 (90 BASE) MCG/ACT inhaler Inhale 2 puffs into the lungs every 4 (four) hours as needed for wheezing or shortness of breath (cough). 11/16/15  Yes Gean Quint, MD  benztropine (COGENTIN) 1 MG tablet Take 1 tablet (1 mg total) by mouth daily. 11/12/16  Yes Michel Bickers, MD  dapsone 100 MG tablet Take 100 mg by mouth daily.   Yes [provider]  bictegravir-emtricitabine-tenofovir AF (BIKTARVY) 50-200-25 MG TABS tablet Take 1 tablet daily by mouth. Patient not taking: Reported on 12/29/2017 10/24/17   Kayleen Memos, DO  fluconazole (DIFLUCAN) 200 MG tablet Take 1 tablet (200 mg total) daily by mouth. Patient not taking: Reported on 12/29/2017 10/25/17   Kayleen Memos, DO  OLANZapine (ZYPREXA) 5 MG tablet Take 1 tablet (  5 mg total) by mouth at bedtime. 11/12/16   Michel Bickers, MD  ondansetron (ZOFRAN ODT) 8 MG disintegrating tablet Take 1 tablet (8 mg total) by mouth every 8 (eight) hours as needed for nausea or vomiting. Patient not taking: Reported on 12/29/2017 09/03/17   Carlyle Basques, MD  pantoprazole (PROTONIX) 40 MG tablet Take 1 tablet (40 mg total) by mouth daily. Patient not taking: Reported on 12/29/2017 11/12/16   Michel Bickers, MD  traMADol (ULTRAM) 50 MG tablet Take 1 tablet (50 mg total) by mouth every 6 (six) hours as needed for moderate pain. Patient not taking: Reported on 12/29/2017 08/05/17   Charlynne Cousins, MD    Physical Exam: Vitals:   12/30/17 0838 12/30/17 0839 12/30/17 0843 12/30/17 0900  BP: 119/88  112/83 111/82  Pulse:   99 93  Resp: (!) 24  16 14 11   Temp:      TempSrc:      SpO2:   90% 93%  Weight:      Height:        Constitutional: NAD, calm, comfortable very cachectic and malnourished appearing Vitals:   12/30/17 0838 12/30/17 0839 12/30/17 0843 12/30/17 0900  BP: 119/88  112/83 111/82  Pulse:   99 93  Resp: (!) 24 16 14 11   Temp:      TempSrc:      SpO2:   90% 93%  Weight:      Height:       Eyes: PERRL, lids and conjunctivae normal ENMT: Mucous membranes are moist. Posterior pharynx clear of any exudate or lesions.Normal dentition.  Neck: normal, supple, no masses, no thyromegaly Respiratory: clear to auscultation bilaterally, no wheezing, no crackles. Normal respiratory effort. No accessory muscle use.  Cardiovascular: Regular rate and rhythm, no murmurs / rubs / gallops. No extremity edema. 2+ pedal pulses. No carotid bruits.  Abdomen: no tenderness, no masses palpated. No hepatosplenomegaly. Bowel sounds positive.  Musculoskeletal: no clubbing / cyanosis. No joint deformity upper and lower extremities. Good ROM, no contractures. Normal muscle tone.  Skin: no rashes, lesions, ulcers. No induration Neurologic: CN 2-12 grossly intact. Sensation intact, DTR normal. Strength 5/5 in all 4.  Psychiatric: Normal judgment and insight. Alert and oriented x 3. Normal mood.    Labs on Admission: I have personally reviewed following labs and imaging studies  CBC: Recent Labs  Lab 12/29/17 1940  WBC 3.3*  HGB 9.6*  HCT 28.9*  MCV 75.9*  PLT 38*   Basic Metabolic Panel: Recent Labs  Lab 12/29/17 1940 12/30/17 0700  NA 139  --   K 2.3*  --   CL 101  --   CO2 25  --   GLUCOSE 169*  --   BUN 19  --   CREATININE 0.52  --   CALCIUM 9.1  --   MG  --  2.0   GFR: Estimated Creatinine Clearance: 63.6 mL/min (by C-G formula based on SCr of 0.52 mg/dL). Liver Function Tests: Recent Labs  Lab 12/29/17 1940  AST 161*  ALT 37  ALKPHOS 944*  BILITOT 14.7*  PROT 7.1  ALBUMIN 2.6*   Recent Labs  Lab  12/29/17 1940  LIPASE 1,305*   No results for input(s): AMMONIA in the last 168 hours. Coagulation Profile: No results for input(s): INR, PROTIME in the last 168 hours. Cardiac Enzymes: No results for input(s): CKTOTAL, CKMB, CKMBINDEX, TROPONINI in the last 168 hours. BNP (last 3 results) No results for input(s): PROBNP in the last 8760  hours. HbA1C: No results for input(s): HGBA1C in the last 72 hours. CBG: No results for input(s): GLUCAP in the last 168 hours. Lipid Profile: No results for input(s): CHOL, HDL, LDLCALC, TRIG, CHOLHDL, LDLDIRECT in the last 72 hours. Thyroid Function Tests: No results for input(s): TSH, T4TOTAL, FREET4, T3FREE, THYROIDAB in the last 72 hours. Anemia Panel: No results for input(s): VITAMINB12, FOLATE, FERRITIN, TIBC, IRON, RETICCTPCT in the last 72 hours. Urine analysis:    Component Value Date/Time   COLORURINE AMBER (A) 12/30/2017 0350   APPEARANCEUR CLEAR 12/30/2017 0350   LABSPEC >1.046 (H) 12/30/2017 0350   PHURINE 6.0 12/30/2017 0350   GLUCOSEU NEGATIVE 12/30/2017 0350   HGBUR NEGATIVE 12/30/2017 0350   BILIRUBINUR MODERATE (A) 12/30/2017 0350   KETONESUR NEGATIVE 12/30/2017 0350   PROTEINUR NEGATIVE 12/30/2017 0350   UROBILINOGEN 0.2 12/07/2014 1441   NITRITE NEGATIVE 12/30/2017 0350   LEUKOCYTESUR NEGATIVE 12/30/2017 0350   Sepsis Labs: !!!!!!!!!!!!!!!!!!!!!!!!!!!!!!!!!!!!!!!!!!!! @LABRCNTIP (procalcitonin:4,lacticidven:4) )No results found for this or any previous visit (from the past 240 hour(s)).   Radiological Exams on Admission: Ct Abdomen Pelvis W Contrast  Result Date: 12/30/2017 CLINICAL DATA:  Generalized abdominal pain, vomiting and hematuria. HIV positive. Elevated lipase. Prior cholecystectomy. EXAM: CT ABDOMEN AND PELVIS WITH CONTRAST TECHNIQUE: Multidetector CT imaging of the abdomen and pelvis was performed using the standard protocol following bolus administration of intravenous contrast. CONTRAST:  80 cc Isovue-300  IV. COMPARISON:  10/21/2017 CT abdomen/pelvis. FINDINGS: Lower chest: No significant pulmonary nodules or acute consolidative airspace disease. Hepatobiliary: Normal liver size. No liver mass. Cholecystectomy. No biliary ductal dilatation. Common bile duct diameter 5 mm. Pancreas: There is acute diffuse pancreatic enlargement with prominent pancreatic and peripancreatic edema with ill-defined peripancreatic fluid extending into the bilateral anterior paranephric retroperitoneal spaces and into the lesser sac, compatible with acute pancreatitis. No regions of pancreatic parenchymal nonenhancement or gas. No pancreatic mass or duct dilation. No measurable peripancreatic fluid collections. Spleen: Normal size. No mass. Adrenals/Urinary Tract: Normal adrenals. Normal kidneys with no hydronephrosis and no renal mass. Normal bladder. Stomach/Bowel: Normal non-distended stomach. Normal caliber small bowel with no small bowel wall thickening. Appendix. Normal large bowel with no diverticulosis, large bowel wall thickening or pericolonic fat stranding. Vascular/Lymphatic: Normal caliber abdominal aorta. Patent portal, splenic, hepatic and renal veins. Stable mildly enlarged 1.0 cm right retrocrural node (series 2/image 15). Stable mildly enlarged 1.2 cm porta hepatis node (series 2/image 22). Stable mild left para-aortic adenopathy measuring up to 1.4 cm (series 2/image 31). No new pathologically enlarged abdominopelvic nodes. Reproductive: Grossly normal uterus.  No adnexal mass. Other: Small volume ascites. No pneumoperitoneum. No focal fluid collection. Musculoskeletal: No aggressive appearing focal osseous lesions. IMPRESSION: 1. Acute pancreatitis diffusely involving the pancreas, without complication. 2. Small volume ascites. 3. Chronic porta hepatis and retroperitoneal adenopathy is stable and probably reactive. Electronically Signed   By: Ilona Sorrel M.D.   On: 12/30/2017 02:45    Old chart reviewed Case  discussed with Dr. Graylon Good for ID consultation EKG reviewed normal sinus rhythm with early re-pole  Assessment/Plan This is a 42 year old female with AIDS/HIV who comes in with failure to thrive found to have acute pancreatitis Principal Problem:   Pancreatitis-will place on ice chips and liquids and see how she tolerates that.  IV fluids.  Repeat lipase level in the morning.  Her AST is 161 ALT is 37 total bili is up to 14.7.  CT scan does not show any stones or any common bile duct dilation.  We will treat  conservatively with aggressive IV fluids and repeat CMP and lipase in the morning.  She is status post cholecystectomy.  Check triglyceride levels.  Active Problems:   HIV disease (HCC)-AIDS- has spoken to Dr. Graylon Good with ID who will see the patient in consultation either later today or in the morning.   Bipolar disorder (HCC)-continue home medications   Hypokalemia-patient is getting many milliequivalents of KCl IV right now.  Which is almost finished.  We will repeat a basic metabolic panel midday and replete more if needed.  Magnesium level is normal.   Esophageal stricture-we will placed on Diflucan IV at this point.  She will need to be switched to nystatin liquid at discharge as she cannot tolerate Diflucan pills.   Dysphagia as above treat for Candida infection.   Candidal esophagitis (HCC) as above   Severe protein-calorie malnutrition (HCC) noted   Abdominal pain-secondary to pancreatitis treat as above     DVT prophylaxis: SCDs Code Status: Full Family Communication: None Disposition Plan: Per day team Consults called: ID Dr. Graylon Good Admission status: Admission   Rees Matura A MD [facility]  If 7PM-7AM, please contact night-coverage [email or web address] Password [facility]  12/30/2017, 9:28 AM

## 2017-12-30 NOTE — ED Notes (Signed)
Report given to Rebecca, RN

## 2017-12-30 NOTE — ED Provider Notes (Signed)
H/o AIDs, with N, V, abdominal, no fever Jaundiced and dry on exam Lipase 1300, transaminases up Pending CT  CT confirms diagnosis of uncomplicated pancreatitis. Discussed with hospitalist Fuller Plan) who accepts the patient for admission.   Charlann Lange, PA-C 12/30/17 [id number]    Davonna Belling, MD 12/30/17 1451

## 2017-12-31 ENCOUNTER — Inpatient Hospital Stay (HOSPITAL_COMMUNITY): Payer: Medicaid Other

## 2017-12-31 LAB — COMPREHENSIVE METABOLIC PANEL WITH GFR
ALT: 23 U/L (ref 14–54)
AST: 106 U/L — ABNORMAL HIGH (ref 15–41)
Albumin: 2.1 g/dL — ABNORMAL LOW (ref 3.5–5.0)
Alkaline Phosphatase: 744 U/L — ABNORMAL HIGH (ref 38–126)
Anion gap: 6 (ref 5–15)
BUN: 14 mg/dL (ref 6–20)
CO2: 21 mmol/L — ABNORMAL LOW (ref 22–32)
Calcium: 8.1 mg/dL — ABNORMAL LOW (ref 8.9–10.3)
Chloride: 115 mmol/L — ABNORMAL HIGH (ref 101–111)
Creatinine, Ser: 0.61 mg/dL (ref 0.44–1.00)
GFR calc Af Amer: >60 mL/min
GFR calc non Af Amer: >60 mL/min
Glucose, Bld: 95 mg/dL (ref 65–99)
Potassium: 2.8 mmol/L — ABNORMAL LOW (ref 3.5–5.1)
Sodium: 142 mmol/L (ref 135–145)
Total Bilirubin: 11.6 mg/dL — ABNORMAL HIGH (ref 0.3–1.2)
Total Protein: 5.7 g/dL — ABNORMAL LOW (ref 6.5–8.1)

## 2017-12-31 LAB — CBC WITH DIFFERENTIAL/PLATELET
BASOS PCT: 1 %
Basophils Absolute: 0 10*3/uL (ref 0.0–0.1)
EOS PCT: 2 %
Eosinophils Absolute: 0 10*3/uL (ref 0.0–0.7)
HEMATOCRIT: 23.1 % — AB (ref 36.0–46.0)
Hemoglobin: 7.4 g/dL — ABNORMAL LOW (ref 12.0–15.0)
LYMPHS PCT: 5 %
Lymphs Abs: 0.1 10*3/uL — ABNORMAL LOW (ref 0.7–4.0)
MCH: 25.2 pg — ABNORMAL LOW (ref 26.0–34.0)
MCHC: 32 g/dL (ref 30.0–36.0)
MCV: 78.6 fL (ref 78.0–100.0)
MONOS PCT: 9 %
Monocytes Absolute: 0.2 10*3/uL (ref 0.1–1.0)
NEUTROS PCT: 83 %
Neutro Abs: 1.8 10*3/uL (ref 1.7–7.7)
Platelets: 30 10*3/uL — ABNORMAL LOW (ref 150–400)
RBC: 2.94 MIL/uL — ABNORMAL LOW (ref 3.87–5.11)
RDW: 21.8 % — ABNORMAL HIGH (ref 11.5–15.5)
WBC: 2.1 10*3/uL — ABNORMAL LOW (ref 4.0–10.5)

## 2017-12-31 LAB — LIPASE, BLOOD: LIPASE: 809 U/L — AB (ref 11–51)

## 2017-12-31 LAB — HEMOGLOBIN AND HEMATOCRIT, BLOOD
HCT: 21.3 % — ABNORMAL LOW (ref 36.0–46.0)
Hemoglobin: 7 g/dL — ABNORMAL LOW (ref 12.0–15.0)

## 2017-12-31 LAB — PREPARE RBC (CROSSMATCH)

## 2017-12-31 MED ORDER — SODIUM CHLORIDE 0.9 % IV SOLN
Freq: Once | INTRAVENOUS | Status: AC
  Administered 2017-12-31: via INTRAVENOUS

## 2017-12-31 MED ORDER — BOOST / RESOURCE BREEZE PO LIQD CUSTOM
1.0000 | Freq: Three times a day (TID) | ORAL | Status: DC
  Administered 2017-12-31 – 2018-01-20 (×26): 1 via ORAL

## 2017-12-31 MED ORDER — POTASSIUM CHLORIDE CRYS ER 20 MEQ PO TBCR: 40.0000 meq | EXTENDED_RELEASE_TABLET | ORAL | Status: DC

## 2017-12-31 MED ORDER — ADULT MULTIVITAMIN W/MINERALS CH
1.0000 | ORAL_TABLET | Freq: Every day | ORAL | Status: DC
  Administered 2018-01-01 – 2018-01-17 (×4): 1 via ORAL
  Filled 2017-12-31 (×14): qty 1

## 2017-12-31 MED ORDER — POTASSIUM CHLORIDE 10 MEQ/100ML IV SOLN
10.0000 meq | INTRAVENOUS | Status: AC
  Administered 2017-12-31 (×6): 10 meq via INTRAVENOUS
  Filled 2017-12-31 (×6): qty 100

## 2017-12-31 MED ORDER — DAPSONE 100 MG PO TABS
100.0000 mg | ORAL_TABLET | Freq: Every day | ORAL | Status: DC
  Administered 2018-01-01 – 2018-01-03 (×3): 100 mg via ORAL
  Filled 2017-12-31 (×4): qty 1

## 2017-12-31 MED ORDER — POTASSIUM CHLORIDE 10 MEQ/100ML IV SOLN
10.0000 meq | INTRAVENOUS | Status: DC
  Filled 2017-12-31 (×3): qty 100

## 2017-12-31 MED ORDER — POTASSIUM CHLORIDE 2 MEQ/ML IV SOLN
INTRAVENOUS | Status: DC
  Administered 2017-12-31 – 2018-01-02 (×4): via INTRAVENOUS
  Filled 2017-12-31 (×8): qty 1000

## 2017-12-31 MED ORDER — PROMETHAZINE HCL 25 MG/ML IJ SOLN: 12.5000 mg | Freq: Three times a day (TID) | INTRAMUSCULAR | Status: DC | PRN

## 2017-12-31 MED ORDER — GADOBENATE DIMEGLUMINE 529 MG/ML IV SOLN
10.0000 mL | Freq: Once | INTRAVENOUS | Status: AC | PRN
  Administered 2017-12-31: 9 mL via INTRAVENOUS

## 2017-12-31 NOTE — Progress Notes (Signed)
Initial Nutrition Assessment  DOCUMENTATION CODES:   Severe malnutrition in context of chronic illness, Underweight  INTERVENTION:  - Will order Boost Breeze po TID, each supplement provides 250 kcal and 9 grams of protein. - Will order daily multivitamin with minerals.  - Diet advancement as medically feasible.   NUTRITION DIAGNOSIS:   Severe Malnutrition related to chronic illness, catabolic illness(HIV/AIDS) as evidenced by severe muscle depletion, severe fat depletion.  GOAL:   Patient will meet greater than or equal to 90% of their needs  MONITOR:   PO intake, Supplement acceptance, Diet advancement, Weight trends, Labs  REASON FOR ASSESSMENT:   Consult Assessment of nutrition requirement/status  ASSESSMENT:   42 y.o.female with medical history significant of HIV AIDS, Candida esophagitis, GERD, bipolar disorder, asthma, who comes in for 2 weeks of progressive worsening epigastric abdominal pain. Patient deteriorating for months, losing a significant amount of weight. She reports she is compliant with her HIV medications. She reports that she has been having dysphagia for months. She was given oral Diflucan however she cannot swallow the pills. She called ID office for liquid format which looks like was ordered by ID but patient never received this. She has been able to tolerate pure her food and able to hold that down. She cannot eat solids without meat getting stuck. She does frequently vomit depending on what she eats. No diarrhea. No cough. Her abdominal pain has been getting worse and is worse after eating. Sometimes she goes days without eating. Patient found to have pancreatitis with a lipase level over 1000  BMI indicates underweight status. No intakes documented since admission. Pt states she tolerated apple juice and lemonade last night without issue. She reports abdominal pain began at 2 AM on the date of admission, no abdominal pain prior to that time, and that the  only thing that worsens this pain is sitting up, dry heaving/vomiting. She denies diarrhea PTA but states that it began this AM and that she has had several episodes since that time. Pt was given nausea medication d/t nausea but felt that it did not help and she is frustrated that she was given this rather than pain medication as she would have preferred the pain medication. She states after taking nausea medication her throat became very dry (dry at baseline but pt unable to state when this started) and another pill became stuck in her throat and began to dissolve there and that it had "a very nasty taste." She reports that with dry throat, she tries to drink liquids and cough until blood clots begin to come up/throws up blood clots. She is unable to state when blood clot-containing emesis began.   Unable to obtain much other information from pt at this time as she needed to use [facility] as she felt she was going to have another BM. X-ray and Dr. Florene Glen also needing to meet with pt after this as they attempted during RD visit. Pt is familiar with Boost Breeze and is requesting that this supplement be ordered. She reports she can only drink liquids if they are very cold.   Physical assessment findings outlined below. Per chart review, pt has lost 21 lbs (17% body weight) in the past 2 months. This is significant for time frame.   Medications reviewed; 10 mEq IV KCl x4 runs yesterday and x6 runs today.  Labs reviewed; K: 2.8 mmol/L, Cl: 115 mmol/L, Ca: 8.1 mg/dL, Alk Phos elevated, AST elevated, lipase: 809 u/L.  IVF: LR-40 mEq KCl @  100 mL/hr.        NUTRITION - FOCUSED PHYSICAL EXAM:    Most Recent Value  Orbital Region  Moderate depletion  Upper Arm Region  Severe depletion  Thoracic and Lumbar Region  Severe depletion  Buccal Region  Moderate depletion  Temple Region  Moderate depletion  Clavicle Bone Region  Severe depletion  Clavicle and Acromion Bone Region  Severe depletion  Scapular Bone  Region  Severe depletion  Dorsal Hand  Severe depletion  Patellar Region  Severe depletion  Anterior Thigh Region  Severe depletion  Posterior Calf Region  Severe depletion  Edema (RD Assessment)  None  Hair  Unable to assess  Eyes  Reviewed [yellow to scaleras]  Mouth  Unable to assess  Skin  Reviewed  Nails  Reviewed       Diet Order:  Diet clear liquid Room service appropriate? Yes; Fluid consistency: Thin  EDUCATION NEEDS:   No education needs have been identified at this time  Skin:  Skin Assessment: Reviewed RN Assessment  Last BM:  1/16  Height:   Ht Readings from Last 1 Encounters:  12/29/17 6' 1"  (1.854 m)    Weight:   Wt Readings from Last 1 Encounters:  12/31/17 102 lb 11.8 oz (46.6 kg)    Ideal Body Weight:  75 kg  BMI:  Body mass index is 13.55 kg/m.  Estimated Nutritional Needs:   Kcal:  1865-2100 (40-45 kcal/kg)  Protein:  70-80 grams (1.5-1.7 grams/kg)  Fluid:  >/= 2 L/day     Jarome Matin, MS, RD, LDN, [facility] Inpatient Clinical Dietitian Pager # [phone] After hours/weekend pager # [phone]

## 2017-12-31 NOTE — Consult Note (Signed)
[facility] for [facility]ious [facility]  Total days of antibiotics 2 fluconazole/biktarvy     Reason for Consult: pancreatitis    Referring Physician: powell  Principal Problem:   Pancreatitis Active Problems:   HIV disease ([place])   Bipolar disorder ([place])   Hypokalemia   Esophageal stricture   Dysphagia   Candidal esophagitis (HCC)   Severe protein-calorie malnutrition (HCC)   Abdominal pain    HPI: [name] is a 42 y.o. female with advanced HIV disease, CD 4 count of 10/VL 1.08MU in Oct 2018, started on biktarvy but has had poor adherence due to gi issues, initially thought to be gallbladder dysfunction, thus she underwent lap chole at end of October but started to have fevers post op day 6. Underwent extensive work up including BM aspirate on 11/8 which was negative for MAC. She has been readmitted for dysphagia, ongoing weight loss where she is 95lbs, she was unable to swallow diflucan and unclear why she had not received nystatin from our office. She has ongoing nausea and often feels that food gets stuck and has abdominal pain with meals. She was found to have ongoing pancytopenia with wbc 2.1, ANC of 1.8, hgb of 7.4, and plt of 30. Lipase of 1300. Hypokalemia of 2.9 but surprisingly no aki with cr 0.54 (likely has very little muscle mass from her cachexia)/ her ALP is 744, AST 106, and ALT 23. With Tbili of 11.6. TG 400. She has intermittently taken her hiv meds due to her poorly controlled n/v.  Past Medical History:  Diagnosis Date  . Acute psychosis ([place]) 03/10/12   2nd admission in last wk for this  . Angio-edema   . Anxiety   . Asthma    inhaler 2xday  . Bipolar disorder ([place])   . Depression   . GERD (gastroesophageal reflux disease)   . Hepatitis B    /E-chart  . History of blood transfusion   . HIV positive ([place])   . Microcytic anemia    h/o per E-chart  . Noncompliance with medication regimen    /e-chart  . 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 Pyelonephritis    h/o per E-chart  . Shortness of breath dyspnea    due to Asthma  . Thyroid disease   . Urticaria     Allergies:  Allergies  Allergen Reactions  . Other Hives and Itching    Berries, TREE NUTS  . Peanut-Containing Drug Products Hives  . Shellfish Allergy Hives  . Bactrim Itching  . Orange Fruit Itching  . Sulfa Antibiotics Itching    MEDICATIONS: . benztropine  1 mg Oral Daily  . bictegravir-emtricitabine-tenofovir AF  1 tablet Oral Daily  . feeding supplement  1 Container Oral TID BM  . multivitamin with minerals  1 tablet Oral Daily  . OLANZapine  5 mg Oral QHS    Social History   Tobacco Use  . Smoking status: Never Smoker  . Smokeless tobacco: Never Used  Substance Use Topics  . Alcohol use: No    Alcohol/week: 1.2 oz    Types: 2 Glasses of wine per week  . Drug use: No    Family History  Problem Relation Age of Onset  . Cancer Mother   . Diabetes Mother   . Diabetes Father   . Heart disease Father   . Diabetes Sister   . Urticaria Sister   . Asthma Son   . Allergic rhinitis Neg Hx   .  Eczema Neg Hx   . Immunodeficiency Neg Hx     Review of Systems -    OBJECTIVE: Temp:  [98.1 F (36.7 C)-99.5 F (37.5 C)] 98.7 F (37.1 C) (01/16 1234) Pulse Rate:  [83-106] 106 (01/16 1234) Resp:  [16-18] 18 (01/16 1234) BP: (111-143)/(72-90) 143/90 (01/16 1234) SpO2:  [95 %-100 %] 100 % (01/16 1234) Weight:  [102 lb 11.8 oz (46.6 kg)] 102 lb 11.8 oz (46.6 kg) (01/16 0538) Away from room getting mri  LABS: Results for orders placed or performed during the hospital encounter of 12/29/17 (from the past 48 hour(s))  Lipase, blood     Status: Abnormal   Collection Time: 12/29/17  7:40 PM  Result Value Ref Range   Lipase 1,305 (H) 11 - 51 U/L    Comment: RESULTS CONFIRMED BY MANUAL DILUTION  Comprehensive metabolic panel     Status: Abnormal   Collection Time: 12/29/17  7:40 PM  Result Value Ref  Range   Sodium 139 135 - 145 mmol/L   Potassium 2.3 (LL) 3.5 - 5.1 mmol/L    Comment: CRITICAL RESULT CALLED TO, READ BACK BY AND VERIFIED WITH: [facility] RN 2030 12/29/17 A NAVARRO    Chloride 101 101 - 111 mmol/L   CO2 25 22 - 32 mmol/L   Glucose, Bld 169 (H) 65 - 99 mg/dL   BUN 19 6 - 20 mg/dL   Creatinine, Ser 0.52 0.44 - 1.00 mg/dL   Calcium 9.1 8.9 - 10.3 mg/dL   Total Protein 7.1 6.5 - 8.1 g/dL   Albumin 2.6 (L) 3.5 - 5.0 g/dL   AST 161 (H) 15 - 41 U/L   ALT 37 14 - 54 U/L   Alkaline Phosphatase 944 (H) 38 - 126 U/L   Total Bilirubin 14.7 (H) 0.3 - 1.2 mg/dL   GFR calc non Af Amer >60 >60 mL/min   GFR calc Af Amer >60 >60 mL/min    Comment: (NOTE) The eGFR has been calculated using the CKD EPI equation. This calculation has not been validated in all clinical situations. eGFR's persistently <60 mL/min signify possible Chronic Kidney Disease.    Anion gap 13 5 - 15  CBC     Status: Abnormal   Collection Time: 12/29/17  7:40 PM  Result Value Ref Range   WBC 3.3 (L) 4.0 - 10.5 K/uL   RBC 3.81 (L) 3.87 - 5.11 MIL/uL   Hemoglobin 9.6 (L) 12.0 - 15.0 g/dL   HCT 28.9 (L) 36.0 - 46.0 %   MCV 75.9 (L) 78.0 - 100.0 fL   MCH 25.2 (L) 26.0 - 34.0 pg   MCHC 33.2 30.0 - 36.0 g/dL   RDW 21.1 (H) 11.5 - 15.5 %   Platelets 38 (L) 150 - 400 K/uL    Comment: REPEATED TO VERIFY SPECIMEN CHECKED FOR CLOTS PLATELET COUNT CONFIRMED BY SMEAR   I-Stat beta hCG blood, ED     Status: None   Collection Time: 12/29/17  7:54 PM  Result Value Ref Range   I-stat hCG, quantitative <5.0 <5 mIU/mL   Comment 3            Comment:   GEST. AGE      CONC.  (mIU/mL)   <=1 WEEK        5 - 50     2 WEEKS       50 - 500     3 WEEKS       100 - 10,000  4 WEEKS     1,000 - 30,000        FEMALE AND NON-PREGNANT FEMALE:     LESS THAN 5 mIU/mL   Urinalysis, Routine w reflex microscopic     Status: Abnormal   Collection Time: 12/30/17  3:50 AM  Result Value Ref Range   Color, Urine AMBER (A) YELLOW      Comment: BIOCHEMICALS MAY BE AFFECTED BY COLOR   APPearance CLEAR CLEAR   Specific Gravity, Urine >1.046 (H) 1.005 - 1.030   pH 6.0 5.0 - 8.0   Glucose, UA NEGATIVE NEGATIVE mg/dL   Hgb urine dipstick NEGATIVE NEGATIVE   Bilirubin Urine MODERATE (A) NEGATIVE   Ketones, ur NEGATIVE NEGATIVE mg/dL   Protein, ur NEGATIVE NEGATIVE mg/dL   Nitrite NEGATIVE NEGATIVE   Leukocytes, UA NEGATIVE NEGATIVE  Rapid urine drug screen (hospital performed)     Status: Abnormal   Collection Time: 12/30/17  3:50 AM  Result Value Ref Range   Opiates POSITIVE (A) NONE DETECTED   Cocaine NONE DETECTED NONE DETECTED   Benzodiazepines NONE DETECTED NONE DETECTED   Amphetamines NONE DETECTED NONE DETECTED   Tetrahydrocannabinol NONE DETECTED NONE DETECTED   Barbiturates NONE DETECTED NONE DETECTED    Comment: (NOTE) DRUG SCREEN FOR MEDICAL PURPOSES ONLY.  IF CONFIRMATION IS NEEDED FOR ANY PURPOSE, NOTIFY LAB WITHIN 5 DAYS. LOWEST DETECTABLE LIMITS FOR URINE DRUG SCREEN Drug Class                     Cutoff (ng/mL) Amphetamine and metabolites    1000 Barbiturate and metabolites    200 Benzodiazepine                 597 Tricyclics and metabolites     300 Opiates and metabolites        300 Cocaine and metabolites        300 THC                            50   Magnesium     Status: None   Collection Time: 12/30/17  7:00 AM  Result Value Ref Range   Magnesium 2.0 1.7 - 2.4 mg/dL  Basic metabolic panel     Status: Abnormal   Collection Time: 12/30/17 10:03 AM  Result Value Ref Range   Sodium 142 135 - 145 mmol/L   Potassium 2.9 (L) 3.5 - 5.1 mmol/L    Comment: DELTA CHECK NOTED REPEATED TO VERIFY NO VISIBLE HEMOLYSIS    Chloride 110 101 - 111 mmol/L   CO2 24 22 - 32 mmol/L   Glucose, Bld 117 (H) 65 - 99 mg/dL   BUN 18 6 - 20 mg/dL   Creatinine, Ser 0.54 0.44 - 1.00 mg/dL   Calcium 8.0 (L) 8.9 - 10.3 mg/dL   GFR calc non Af Amer >60 >60 mL/min   GFR calc Af Amer >60 >60 mL/min     Comment: (NOTE) The eGFR has been calculated using the CKD EPI equation. This calculation has not been validated in all clinical situations. eGFR's persistently <60 mL/min signify possible Chronic Kidney Disease.    Anion gap 8 5 - 15  Triglycerides     Status: Abnormal   Collection Time: 12/30/17 11:18 AM  Result Value Ref Range   Triglycerides 400 (H) <150 mg/dL  CBC with Differential/Platelet     Status: Abnormal   Collection Time: 12/31/17  7:27 AM  Result  Value Ref Range   WBC 2.1 (L) 4.0 - 10.5 K/uL   RBC 2.94 (L) 3.87 - 5.11 MIL/uL   Hemoglobin 7.4 (L) 12.0 - 15.0 g/dL    Comment: REPEATED TO VERIFY DELTA CHECK NOTED RESULTS VERIFIED VIA RECOLLECT    HCT 23.1 (L) 36.0 - 46.0 %   MCV 78.6 78.0 - 100.0 fL   MCH 25.2 (L) 26.0 - 34.0 pg   MCHC 32.0 30.0 - 36.0 g/dL   RDW 21.8 (H) 11.5 - 15.5 %   Platelets 30 (L) 150 - 400 K/uL    Comment: CONSISTENT WITH PREVIOUS RESULT   Neutrophils Relative % 83 %   Lymphocytes Relative 5 %   Monocytes Relative 9 %   Eosinophils Relative 2 %   Basophils Relative 1 %   Neutro Abs 1.8 1.7 - 7.7 K/uL   Lymphs Abs 0.1 (L) 0.7 - 4.0 K/uL   Monocytes Absolute 0.2 0.1 - 1.0 K/uL   Eosinophils Absolute 0.0 0.0 - 0.7 K/uL   Basophils Absolute 0.0 0.0 - 0.1 K/uL   RBC Morphology TARGET CELLS     Comment: POLYCHROMASIA PRESENT   WBC Morphology MILD LEFT SHIFT (1-5% METAS, OCC MYELO, OCC BANDS)     Comment: DOHLE BODIES  Comprehensive metabolic panel     Status: Abnormal   Collection Time: 12/31/17  7:27 AM  Result Value Ref Range   Sodium 142 135 - 145 mmol/L    Comment: RESULTS VERIFIED VIA RECOLLECT   Potassium 2.8 (L) 3.5 - 5.1 mmol/L   Chloride 115 (H) 101 - 111 mmol/L   CO2 21 (L) 22 - 32 mmol/L   Glucose, Bld 95 65 - 99 mg/dL   BUN 14 6 - 20 mg/dL   Creatinine, Ser 0.61 0.44 - 1.00 mg/dL   Calcium 8.1 (L) 8.9 - 10.3 mg/dL   Total Protein 5.7 (L) 6.5 - 8.1 g/dL   Albumin 2.1 (L) 3.5 - 5.0 g/dL   AST 106 (H) 15 - 41 U/L   ALT 23  14 - 54 U/L   Alkaline Phosphatase 744 (H) 38 - 126 U/L   Total Bilirubin 11.6 (H) 0.3 - 1.2 mg/dL   GFR calc non Af Amer >60 >60 mL/min   GFR calc Af Amer >60 >60 mL/min    Comment: (NOTE) The eGFR has been calculated using the CKD EPI equation. This calculation has not been validated in all clinical situations. eGFR's persistently <60 mL/min signify possible Chronic Kidney Disease.    Anion gap 6 5 - 15  Lipase, blood     Status: Abnormal   Collection Time: 12/31/17  7:27 AM  Result Value Ref Range   Lipase 809 (H) 11 - 51 U/L    Comment: RESULTS CONFIRMED BY MANUAL DILUTION  Hemoglobin and hematocrit, blood     Status: Abnormal   Collection Time: 12/31/17  2:18 PM  Result Value Ref Range   Hemoglobin 7.0 (L) 12.0 - 15.0 g/dL   HCT 21.3 (L) 36.0 - 46.0 %    MICRO:  IMAGING: Ct Abdomen Pelvis W Contrast  Result Date: 12/30/2017 CLINICAL DATA:  Generalized abdominal pain, vomiting and hematuria. HIV positive. Elevated lipase. Prior cholecystectomy. EXAM: CT ABDOMEN AND PELVIS WITH CONTRAST TECHNIQUE: Multidetector CT imaging of the abdomen and pelvis was performed using the standard protocol following bolus administration of intravenous contrast. CONTRAST:  80 cc Isovue-300 IV. COMPARISON:  10/21/2017 CT abdomen/pelvis. FINDINGS: Lower chest: No significant pulmonary nodules or acute consolidative airspace disease. Hepatobiliary: Normal  liver size. No liver mass. Cholecystectomy. No biliary ductal dilatation. Common bile duct diameter 5 mm. Pancreas: There is acute diffuse pancreatic enlargement with prominent pancreatic and peripancreatic edema with ill-defined peripancreatic fluid extending into the bilateral anterior paranephric retroperitoneal spaces and into the lesser sac, compatible with acute pancreatitis. No regions of pancreatic parenchymal nonenhancement or gas. No pancreatic mass or duct dilation. No measurable peripancreatic fluid collections. Spleen: Normal size. No mass.  Adrenals/Urinary Tract: Normal adrenals. Normal kidneys with no hydronephrosis and no renal mass. Normal bladder. Stomach/Bowel: Normal non-distended stomach. Normal caliber small bowel with no small bowel wall thickening. Appendix. Normal large bowel with no diverticulosis, large bowel wall thickening or pericolonic fat stranding. Vascular/Lymphatic: Normal caliber abdominal aorta. Patent portal, splenic, hepatic and renal veins. Stable mildly enlarged 1.0 cm right retrocrural node (series 2/image 15). Stable mildly enlarged 1.2 cm porta hepatis node (series 2/image 22). Stable mild left para-aortic adenopathy measuring up to 1.4 cm (series 2/image 31). No new pathologically enlarged abdominopelvic nodes. Reproductive: Grossly normal uterus.  No adnexal mass. Other: Small volume ascites. No pneumoperitoneum. No focal fluid collection. Musculoskeletal: No aggressive appearing focal osseous lesions. IMPRESSION: 1. Acute pancreatitis diffusely involving the pancreas, without complication. 2. Small volume ascites. 3. Chronic porta hepatis and retroperitoneal adenopathy is stable and probably reactive. Electronically Signed   By: Ilona Sorrel M.D.   On: 12/30/2017 02:45   Dg Chest Port 1 View  Result Date: 12/31/2017 CLINICAL DATA:  Shortness of breath, chest congestion, and productive cough. History of HIV, nonsmoker. EXAM: PORTABLE CHEST 1 VIEW COMPARISON:  Chest x-ray and chest CT scan of October 21, 2017 FINDINGS: The lungs are well-expanded. There is no focal infiltrate. There is a rounded approximately 1.5 cm diameter structure projecting over the posterolateral aspect of the left ninth rib which may reflect a nipple shadow. No similar finding is noted on the right however. Some asymmetry of the nipples was noted on the CT scan of October 21, 2017. The apices appear clear. The heart and pulmonary vascularity are normal. The mediastinum is normal in width. No mediastinal or hilar lymphadenopathy is observed.  There is no pleural effusion. The bony thorax is unremarkable. IMPRESSION: No acute infiltrate or definite mass. Presumed prominent nipple shadow on the left. A repeat chest x-ray with nipple markers and a lateral view would be useful to more completely evaluate the thorax in this patient with cryptogenic hemoptysis. These findings were discussed by telephone with Dr. Florene Glen. Electronically Signed   By: David  Martinique M.D.   On: 12/31/2017 13:27    HISTORICAL MICRO/IMAGING  Assessment/Plan: 42yo F with poorly controlled advanced hiv disease, CD 4 count of 10/VL 1.05MU in October s/p lap chole in Oct 2018 who has worsening abdominal discomfort after eating, found to have acute pancreatitis  hiv disease = continue with biktarvy. Newer HIV meds have better track record of decreased side effects and don't have the incidence of pancreatitis as the older regimen. Given her advanced hiv disease, I would continue her HIV regimen and see if there are other issues contributing to her pancreatitis.  Esophageal candidiasis = due to her dysphagia and being npo, would continue her antifungal as an IV preparation for now. I am also concern that maybe she has more than one condition contributing to her esophagitis. I wonder if CMV is in the picture. Recommend for GI to see her to see if she would benefit from repeat EGD. She has been seen by [facility] in the past. Records  show that she has had previous EGD with candidiasis in Jan 2018 based on path reports in the system  oi proph = would give bactrim DS daily equivalent by IV for now.

## 2017-12-31 NOTE — Progress Notes (Signed)
PROGRESS NOTE    [name]  XTA:[id number] DOB: 03/02/1976 DOA: 12/29/2017 PCP: Nolene Ebbs, MD   Brief Narrative:  [name] is [name] 43 y.o. female with medical history significant of HIV AIDS, Candida esophagitis, GERD, bipolar disorder, asthma, who comes in for 2 weeks of progressive worsening epigastric abdominal pain.  She had lab and imaging findings c/w pancreatitis.   Assessment & Plan:   Principal Problem:   Pancreatitis Active Problems:   HIV disease ([place])   Bipolar disorder ([place])   Hypokalemia   Esophageal stricture   Dysphagia   Candidal esophagitis (H[facility])   Severe protein-calorie malnutrition ([facility])   Abdominal pain   Pancreatitis  Elevated Liver Enzymes: elevated bili and alk phos concerning for pancreatitis related to stone despite hx of cholecystectomy.  Discussed antiretroviral as possible etiology with ID, but thought less likely.  Triglycerides 400.   Continue clear liquid diet Antiemetics, analgesics IVF MRCP given above GI c/s, appreciate recs  HIV  AIDS:  ID c/s pending, appreciate recs Continue antiretrovirals Continue dapsone (pt allergic to bactrim)  Dysphagia  Candidal Esophagitis  Esophageal Stricture  Weight loss:   Discussed with ID, rec CMV quant and GI c/s to consider endoscopy and CMV esophagitis given history Dietician  Hematemesis vs hemoptysis:  Unclear etiology, she describes this relatively poorly.  Notes she feels like her throat is dry, then has to "clear throat" and brings up blood clots.  She notes occasional blood when blowing nose, could be related to this.  Alternatively could be related to possible esophagitis.  With presentation and history, lower suspicion for hemoptysis, but will follow up CXR.  Continue to monitor.  GI c/s as above.       Anemia: suspect degree of dilution with IVF with pancreatitis, but with downtrending and at borderline will discuss transfusion with patient.  [ ]  hemoccult    Thrombocytopenia: this has worsened since 2018.  Possibly related to HIV.  Will continue to monitor for now.  No indication for transfusion right now.      Bipolar disorder: continue home meds as able  Hypokalemia: f/u mg, replete prn.  K in IVF.   Severe Protein Calorie malnutrition: dietician  DVT prophylaxis: SCD Code Status: full code Family Communication: none at bedside Disposition Plan: pending  Consultants:   ID and GI  Procedures:   none  Antimicrobials:  Anti-infectives (From admission, onward)   Start     Dose/Rate Route Frequency Ordered Stop   12/31/17 1000  fluconazole (DIFLUCAN) IVPB 100 mg  Status:  Discontinued     100 mg 50 mL/hr over 60 Minutes Intravenous Every 24 hours 12/30/17 1001 12/30/17 1431   12/31/17 1000  fluconazole (DIFLUCAN) IVPB 400 mg     400 mg 100 mL/hr over 120 Minutes Intravenous Every 24 hours 12/30/17 1431     12/30/17 1100  fluconazole (DIFLUCAN) IVPB 200 mg     200 mg 100 mL/hr over 60 Minutes Intravenous  Once 12/30/17 1001 12/30/17 1220   12/30/17 1000  bictegravir-emtricitabine-tenofovir AF (BIKTARVY) 50-200-25 MG per tablet 1 tablet     1 tablet Oral Daily 12/30/17 0933        Subjective: Notes abdominal pain maybe [name] bit better. Notes she's been "coughing" up blood clots for the past 2 weeks.  Not clearly hematemesis, sounds more like she's "clearing her throat" and bringing up blood clots.  She notes she's had bloody nasal drainage for the past few days as well.  Objective: Vitals:   12/30/17 1530 12/30/17 1622 12/30/17 2138 12/31/17 0538  BP: 114/78 111/84 122/79 123/72  Pulse: 94 83 86 94  Resp: 17 18 16 18   Temp:  98.2 F (36.8 C) 98.1 F (36.7 C) 99.5 F (37.5 C)  TempSrc:  Oral Oral Oral  SpO2: 100% 100% 100% 95%  Weight:    46.6 kg (102 lb 11.8 oz)  Height:        Intake/Output Summary (Last 24 hours) at 12/31/2017 1137 Last data filed at 12/31/2017 0933 Gross per 24 hour  Intake 2656.67 ml  Output  475 ml  Net 2181.67 ml   Filed Weights   12/29/17 2235 12/31/17 0538  Weight: 43.1 kg (95 lb) 46.6 kg (102 lb 11.8 oz)    Examination:  General exam: Appears calm and comfortable  Respiratory system: Clear to auscultation. Respiratory effort normal. Cardiovascular system: S1 & S2 heard, RRR. No JVD, murmurs, rubs, gallops or clicks. No pedal edema. Gastrointestinal system: Abdomen is nondistended, soft and diffusely tender. No organomegaly or masses felt. Normal bowel sounds heard. Central nervous system: Alert and oriented. No focal neurological deficits. Extremities: Symmetric 5 x 5 power. Skin: No rashes, lesions or ulcers Psychiatry: Judgement and insight appear normal. Mood & affect appropriate.     Data Reviewed: I have personally reviewed following labs and imaging studies  CBC: Recent Labs  Lab 12/29/17 1940 12/31/17 0727  WBC 3.3* 2.1*  NEUTROABS  --  1.8  HGB 9.6* 7.4*  HCT 28.9* 23.1*  MCV 75.9* 78.6  PLT 38* 30*   Basic Metabolic Panel: Recent Labs  Lab 12/29/17 1940 12/30/17 0700 12/30/17 1003 12/31/17 0727  NA 139  --  142 142  K 2.3*  --  2.9* 2.8*  CL 101  --  110 115*  CO2 25  --  24 21*  GLUCOSE 169*  --  117* 95  BUN 19  --  18 14  CREATININE 0.52  --  0.54 0.61  CALCIUM 9.1  --  8.0* 8.1*  MG  --  2.0  --   --    GFR: Estimated Creatinine Clearance: 68.8 mL/min (by C-G formula based on SCr of 0.61 mg/dL). Liver Function Tests: Recent Labs  Lab 12/29/17 1940 12/31/17 0727  AST 161* 106*  ALT 37 23  ALKPHOS 944* 744*  BILITOT 14.7* 11.6*  PROT 7.1 5.7*  ALBUMIN 2.6* 2.1*   Recent Labs  Lab 12/29/17 1940 12/31/17 0727  LIPASE 1,305* 809*   No results for input(s): AMMONIA in the last 168 hours. Coagulation Profile: No results for input(s): INR, PROTIME in the last 168 hours. Cardiac Enzymes: No results for input(s): CKTOTAL, CKMB, CKMBINDEX, TROPONINI in the last 168 hours. BNP (last 3 results) No results for input(s):  PROBNP in the last 8760 hours. HbA1C: No results for input(s): HGBA1C in the last 72 hours. CBG: No results for input(s): GLUCAP in the last 168 hours. Lipid Profile: Recent Labs    12/30/17 1118  TRIG 400*   Thyroid Function Tests: No results for input(s): TSH, T4TOTAL, FREET4, T3FREE, THYROIDAB in the last 72 hours. Anemia Panel: No results for input(s): VITAMINB12, FOLATE, FERRITIN, TIBC, IRON, RETICCTPCT in the last 72 hours. Sepsis Labs: No results for input(s): PROCALCITON, LATICACIDVEN in the last 168 hours.  No results found for this or any previous visit (from the past 240 hour(s)).       Radiology Studies: Ct Abdomen Pelvis W Contrast  Result Date: 12/30/2017 CLINICAL DATA:  Generalized abdominal pain, vomiting and hematuria. HIV positive. Elevated lipase. Prior cholecystectomy. EXAM: CT ABDOMEN AND PELVIS WITH CONTRAST TECHNIQUE: Multidetector CT imaging of the abdomen and pelvis was performed using the standard protocol following bolus administration of intravenous contrast. CONTRAST:  80 cc Isovue-300 IV. COMPARISON:  10/21/2017 CT abdomen/pelvis. FINDINGS: Lower chest: No significant pulmonary nodules or acute consolidative airspace disease. Hepatobiliary: Normal liver size. No liver mass. Cholecystectomy. No biliary ductal dilatation. Common bile duct diameter 5 mm. Pancreas: There is acute diffuse pancreatic enlargement with prominent pancreatic and peripancreatic edema with ill-defined peripancreatic fluid extending into the bilateral anterior paranephric retroperitoneal spaces and into the lesser sac, compatible with acute pancreatitis. No regions of pancreatic parenchymal nonenhancement or gas. No pancreatic mass or duct dilation. No measurable peripancreatic fluid collections. Spleen: Normal size. No mass. Adrenals/Urinary Tract: Normal adrenals. Normal kidneys with no hydronephrosis and no renal mass. Normal bladder. Stomach/Bowel: Normal non-distended stomach. Normal  caliber small bowel with no small bowel wall thickening. Appendix. Normal large bowel with no diverticulosis, large bowel wall thickening or pericolonic fat stranding. Vascular/Lymphatic: Normal caliber abdominal aorta. Patent portal, splenic, hepatic and renal veins. Stable mildly enlarged 1.0 cm right retrocrural node (series 2/image 15). Stable mildly enlarged 1.2 cm porta hepatis node (series 2/image 22). Stable mild left para-aortic adenopathy measuring up to 1.4 cm (series 2/image 31). No new pathologically enlarged abdominopelvic nodes. Reproductive: Grossly normal uterus.  No adnexal mass. Other: Small volume ascites. No pneumoperitoneum. No focal fluid collection. Musculoskeletal: No aggressive appearing focal osseous lesions. IMPRESSION: 1. Acute pancreatitis diffusely involving the pancreas, without complication. 2. Small volume ascites. 3. Chronic porta hepatis and retroperitoneal adenopathy is stable and probably reactive. Electronically Signed   By: Ilona Sorrel M.D.   On: 12/30/2017 02:45        Scheduled Meds: . benztropine  1 mg Oral Daily  . bictegravir-emtricitabine-tenofovir AF  1 tablet Oral Daily  . OLANZapine  5 mg Oral QHS   Continuous Infusions: . fluconazole (DIFLUCAN) IV 400 mg (12/31/17 0929)  . lactated ringers with kcl    . potassium chloride       LOS: 1 day    Time spent: over 30 min    Fayrene Helper, MD [facility] Pager [phone]  If 7PM-7AM, please contact night-coverage [email or web address] Password [facility] 12/31/2017, 11:37 AM

## 2018-01-01 ENCOUNTER — Ambulatory Visit: Payer: Self-pay | Admitting: *Deleted

## 2018-01-01 DIAGNOSIS — F3131 Bipolar disorder, current episode depressed, mild: Secondary | ICD-10-CM

## 2018-01-01 DIAGNOSIS — B20 Human immunodeficiency virus [HIV] disease: Secondary | ICD-10-CM

## 2018-01-01 LAB — CBC
HEMATOCRIT: 23.5 % — AB (ref 36.0–46.0)
Hemoglobin: 7.9 g/dL — ABNORMAL LOW (ref 12.0–15.0)
MCH: 26.5 pg (ref 26.0–34.0)
MCHC: 33.6 g/dL (ref 30.0–36.0)
MCV: 78.9 fL (ref 78.0–100.0)
Platelets: 25 10*3/uL — CL (ref 150–400)
RBC: 2.98 MIL/uL — ABNORMAL LOW (ref 3.87–5.11)
RDW: 20.5 % — AB (ref 11.5–15.5)
WBC: 2 10*3/uL — ABNORMAL LOW (ref 4.0–10.5)

## 2018-01-01 LAB — TYPE AND SCREEN
ABO/RH(D): A POS
Antibody Screen: NEGATIVE
Unit division: 0

## 2018-01-01 LAB — BASIC METABOLIC PANEL
Anion gap: 5 (ref 5–15)
BUN: 9 mg/dL (ref 6–20)
CALCIUM: 8.4 mg/dL — AB (ref 8.9–10.3)
CHLORIDE: 117 mmol/L — AB (ref 101–111)
CO2: 19 mmol/L — AB (ref 22–32)
CREATININE: 0.46 mg/dL (ref 0.44–1.00)
GFR calc Af Amer: >60 mL/min (ref >60)
GFR calc non Af Amer: >60 mL/min (ref >60)
GLUCOSE: 86 mg/dL (ref 65–99)
Potassium: 3.7 mmol/L (ref 3.5–5.1)
Sodium: 141 mmol/L (ref 135–145)

## 2018-01-01 LAB — BPAM RBC
Blood Product Expiration Date: 201902022359
ISSUE DATE / TIME: 201901162344
Unit Type and Rh: 6200

## 2018-01-01 LAB — LIPASE, BLOOD: LIPASE: 208 U/L — AB (ref 11–51)

## 2018-01-01 LAB — HEPATIC FUNCTION PANEL
ALK PHOS: 672 U/L — AB (ref 38–126)
ALT: 21 U/L (ref 14–54)
AST: 99 U/L — ABNORMAL HIGH (ref 15–41)
Albumin: 1.9 g/dL — ABNORMAL LOW (ref 3.5–5.0)
BILIRUBIN DIRECT: 7 mg/dL — AB (ref 0.1–0.5)
BILIRUBIN TOTAL: 11.5 mg/dL — AB (ref 0.3–1.2)
Indirect Bilirubin: 4.5 mg/dL — ABNORMAL HIGH (ref 0.3–0.9)
Total Protein: 5.3 g/dL — ABNORMAL LOW (ref 6.5–8.1)

## 2018-01-01 LAB — OCCULT BLOOD X 1 CARD TO LAB, STOOL: FECAL OCCULT BLD: POSITIVE — AB

## 2018-01-01 LAB — CMV DNA, QUANTITATIVE, PCR
CMV DNA QUANT: POSITIVE [IU]/mL
LOG10 CMV QN DNA PL: UNDETERMINED {Log_IU}/mL

## 2018-01-01 LAB — PROTIME-INR
INR: 1.29
Prothrombin Time: 16 seconds — ABNORMAL HIGH (ref 11.4–15.2)

## 2018-01-01 LAB — MAGNESIUM: MAGNESIUM: 1.8 mg/dL (ref 1.7–2.4)

## 2018-01-01 MED ORDER — SODIUM CHLORIDE 0.9 % IV SOLN: INTRAVENOUS | Status: DC

## 2018-01-01 MED ORDER — SODIUM CHLORIDE 0.9 % IV SOLN: Freq: Once | INTRAVENOUS | Status: DC

## 2018-01-01 NOTE — Anesthesia Preprocedure Evaluation (Addendum)
Anesthesia Evaluation  Patient identified by MRN, date of birth, ID band Patient awake    Reviewed: Allergy & Precautions, NPO status , Patient's Chart, lab work & pertinent test results  History of Anesthesia Complications (+) Emergence DeliriumNegative for: history of anesthetic complications  Airway Mallampati: I   Neck ROM: full    Dental  (+) Teeth Intact   Pulmonary asthma ,    breath sounds clear to auscultation       Cardiovascular negative cardio ROS   Rhythm:regular Rate:Normal     Neuro/Psych PSYCHIATRIC DISORDERS Anxiety Depression Bipolar Disorder Schizophrenia negative neurological ROS     GI/Hepatic PUD, GERD  Medicated,(+) Hepatitis -, B  Endo/Other    Renal/GU      Musculoskeletal   Abdominal   Peds  Hematology  (+) anemia , HIV,   Anesthesia Other Findings EKG 1/19 Normal sinus rhythm T wave abnormality, consider anterior ischemia Prolonged QT Abnormal ECG  Reproductive/Obstetrics                           Anesthesia Physical  Anesthesia Plan  ASA: III  Anesthesia Plan: MAC   Post-op Pain Management:    Induction: Intravenous  PONV Risk Score and Plan:   Airway Management Planned: Simple Face Mask and Nasal Cannula  Additional Equipment: None  Intra-op Plan:   Post-operative Plan:   Informed Consent: I have reviewed the patients History and Physical, chart, labs and discussed the procedure including the risks, benefits and alternatives for the proposed anesthesia with the patient or authorized representative who has indicated his/her understanding and acceptance.   Dental advisory given  Plan Discussed with: Anesthesiologist, Surgeon and CRNA  Anesthesia Plan Comments:        Anesthesia Quick Evaluation

## 2018-01-01 NOTE — Progress Notes (Signed)
PT Cancellation Note  Patient Details Name: [name] MRN: [id number] DOB: 07/23/1976   Cancelled Treatment:    Reason Eval/Treat Not Completed: Patient declined, no reason specified   Weston Anna, MPT Pager: [phone]

## 2018-01-01 NOTE — Consult Note (Signed)
Subjective:   HPI  The patient is a 42 year old female who has AIDS. She has been having epigastric pain for a couple of weeks. She was found to have acute pancreatitis and was admitted to the hospital. MRCP did not reveal any biliary dilatation or gallstones. She has had her gallbladder removed in the past. She also has jaundice. Liver enzymes also elevated. Her CMV DNA quant is elevated. I do not see a hepatitis profile. She has dysphagia which has been a problem for a couple of months. She has a history of esophageal candidiasis in the past, and recently has been treated for oral thrush.  Review of Systems No chest pain or shortness of breath  Past Medical History:  Diagnosis Date  . Acute psychosis ([place]) 03/10/12   2nd admission in last wk for this  . Angio-edema   . Anxiety   . Asthma    inhaler 2xday  . Bipolar disorder ([place])   . Depression   . GERD (gastroesophageal reflux disease)   . Hepatitis B    /E-chart  . History of blood transfusion   . HIV positive ([place])   . Microcytic anemia    h/o per E-chart  . Noncompliance with medication regimen    /e-chart  . 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 Pyelonephritis    h/o per E-chart  . Shortness of breath dyspnea    due to Asthma  . Thyroid disease   . Urticaria    Past Surgical History:  Procedure Laterality Date  . BALLOON DILATION N/A 02/14/2015   Procedure: BALLOON DILATION;  Surgeon: Lear Ng, MD;  Location: [facility] ENDOSCOPY;  Service: Endoscopy;  Laterality: N/A;  . BALLOON DILATION N/A 10/25/2016   Procedure: BALLOON DILATION;  Surgeon: Wilford Corner, MD;  Location: [facility] ENDOSCOPY;  Service: Endoscopy;  Laterality: N/A;  . BALLOON DILATION N/A 01/14/2017   Procedure: BALLOON DILATION;  Surgeon: Wilford Corner, MD;  Location: [facility] ENDOSCOPY;  Service: Endoscopy;  Laterality: N/A;  . BALLOON DILATION N/A 04/30/2017   Procedure: BALLOON DILATION;  Surgeon:  Wilford Corner, MD;  Location: [facility] ENDOSCOPY;  Service: Endoscopy;  Laterality: N/A;  . CHOLECYSTECTOMY    . CHOLECYSTECTOMY N/A 10/15/2017   Procedure: LAPAROSCOPIC CHOLECYSTECTOMY WITH INTRAOPERATIVE CHOLANGIOGRAM;  Surgeon: Coralie Keens, MD;  Location: [place];  Service: General;  Laterality: N/A;  . ESOPHAGOGASTRODUODENOSCOPY N/A 07/13/2013   Procedure: ESOPHAGOGASTRODUODENOSCOPY (EGD);  Surgeon: Beryle Beams, MD;  Location: [facility] ENDOSCOPY;  Service: Endoscopy;  Laterality: N/A;  . ESOPHAGOGASTRODUODENOSCOPY N/A 08/12/2014   Procedure: ESOPHAGOGASTRODUODENOSCOPY (EGD);  Surgeon: Milus Banister, MD;  Location: [facility] ENDOSCOPY;  Service: Endoscopy;  Laterality: N/A;  . ESOPHAGOGASTRODUODENOSCOPY N/A 02/14/2015   Procedure: ESOPHAGOGASTRODUODENOSCOPY (EGD);  Surgeon: Lear Ng, MD;  Location[facility] ENDOSCOPY;  Service: Endoscopy;  Laterality: N/A;  . ESOPHAGOGASTRODUODENOSCOPY N/A 10/25/2016   Procedure: ESOPHAGOGASTRODUODENOSCOPY (EGD);  Surgeon: Wilford Corner, MD;  Location: [facility] ENDOSCOPY;  Service: Endoscopy;  Laterality: N/A;  . ESOPHAGOGASTRODUODENOSCOPY (EGD) WITH PROPOFOL N/A 04/11/2015   Procedure: ESOPHAGOGASTRODUODENOSCOPY (EGD) WITH PROPOFOL;  Surgeon: Wilford Corner, MD;  Location: [facility] ENDOSCOPY;  Service: Endoscopy;  Laterality: N/A;  . ESOPHAGOGASTRODUODENOSCOPY (EGD) WITH PROPOFOL N/A 09/11/2015   Procedure: ESOPHAGOGASTRODUODENOSCOPY (EGD) WITH PROPOFOL;  Surgeon: Wilford Corner, MD;  Location: [facility] ENDOSCOPY;  Service: Endoscopy;  Laterality: N/A;  . ESOPHAGOGASTRODUODENOSCOPY (EGD) WITH PROPOFOL N/A 01/14/2017   Procedure: ESOPHAGOGASTRODUODENOSCOPY (EGD) WITH PROPOFOL;  Surgeon: Wilford Corner, MD;  Location: [facility] ENDOSCOPY;  Service: Endoscopy;  Laterality: N/A;  .  ESOPHAGOGASTRODUODENOSCOPY (EGD) WITH PROPOFOL N/A 04/30/2017   Procedure: ESOPHAGOGASTRODUODENOSCOPY (EGD) WITH PROPOFOL;  Surgeon: Wilford Corner, MD;  Location: [place];  Service: Endoscopy;   Laterality: N/A;  . SAVORY DILATION N/A 02/14/2015   Procedure: SAVORY DILATION;  Surgeon: Lear Ng, MD;  Location: [place];  Service: Endoscopy;  Laterality: N/A;  no xray needed  . SAVORY DILATION N/A 04/11/2015   Procedure: SAVORY DILATION;  Surgeon: Wilford Corner, MD;  Location: [facility] ENDOSCOPY;  Service: Endoscopy;  Laterality: N/A;  . SAVORY DILATION N/A 09/11/2015   Procedure: SAVORY DILATION;  Surgeon: Wilford Corner, MD;  Location: [facility] ENDOSCOPY;  Service: Endoscopy;  Laterality: N/A;  . SAVORY DILATION N/A 04/30/2017   Procedure: SAVORY DILATION;  Surgeon: Wilford Corner, MD;  Location: [place];  Service: Endoscopy;  Laterality: N/A;  . THYROID LOBECTOMY  08/2002   left & isthmectomy; for benign thyroid adenoma/E-chart  . THYROID SURGERY    . TUBAL LIGATION  07/2002   /E-chart   Social History   Socioeconomic History  . Marital status: Single    Spouse name: Not on file  . Number of children: Not on file  . Years of education: Not on file  . Highest education level: Not on file  Social Needs  . Financial resource strain: Not on file  . Food insecurity - worry: Not on file  . Food insecurity - inability: Not on file  . Transportation needs - medical: Not on file  . Transportation needs - non-medical: Not on file  Occupational History  . Not on file  Tobacco Use  . Smoking status: Never Smoker  . Smokeless tobacco: Never Used  Substance and Sexual Activity  . Alcohol use: No    Alcohol/week: 1.2 oz    Types: 2 Glasses of wine per week  . Drug use: No  . Sexual activity: No    Comment: declined condoms  Other Topics Concern  . Not on file  Social History Narrative  . Not on file   family history includes Asthma in her son; Cancer in her mother; Diabetes in her father, mother, and sister; Heart disease in her father; Urticaria in her sister.  Current Facility-Administered Medications:  .  albuterol (PROVENTIL) (2.5 MG/3ML) 0.083% nebulizer  solution 2.5 mg, 2.5 mg, Nebulization, Q4H PRN, Derrill Kay A, MD .  benztropine (COGENTIN) tablet 1 mg, 1 mg, Oral, Daily, Derrill Kay A, MD, 1 mg at 01/01/18 0901 .  bictegravir-emtricitabine-tenofovir AF (BIKTARVY) 50-200-25 MG per tablet 1 tablet, 1 tablet, Oral, Daily, Phillips Grout, MD, 1 tablet at 01/01/18 0901 .  dapsone tablet 100 mg, 100 mg, Oral, Daily, Elodia Florence., MD, 100 mg at 01/01/18 0901 .  feeding supplement (BOOST / RESOURCE BREEZE) liquid 1 Container, 1 Container, Oral, TID BM, Elodia Florence., MD, 1 Container at 01/01/18 1418 .  fluconazole (DIFLUCAN) IVPB 400 mg, 400 mg, Intravenous, Q24H, Carlyle Basques, MD, Stopped at 01/01/18 1137 .  HYDROcodone-acetaminophen (NORCO/VICODIN) 5-325 MG per tablet 1-2 tablet, 1-2 tablet, Oral, Q4H PRN, Derrill Kay A, MD .  lactated ringers 1,000 mL with potassium chloride 40 mEq infusion, , Intravenous, Continuous, Elodia Florence., MD, Last Rate: 100 mL/hr at 01/01/18 1026 .  morphine 4 MG/ML injection 2 mg, 2 mg, Intravenous, Q3H PRN, Phillips Grout, MD, 2 mg at 01/01/18 0618 .  multivitamin with minerals tablet 1 tablet, 1 tablet, Oral, Daily, Elodia Florence., MD, 1 tablet at 01/01/18 0902 .  OLANZapine (ZYPREXA)  tablet 5 mg, 5 mg, Oral, QHS, Derrill Kay A, MD, 5 mg at 12/30/17 2138 .  ondansetron (ZOFRAN) tablet 4 mg, 4 mg, Oral, Q6H PRN **OR** ondansetron (ZOFRAN) injection 4 mg, 4 mg, Intravenous, Q6H PRN, Phillips Grout, MD, 4 mg at 12/31/17 0929 .  promethazine (PHENERGAN) injection 12.5 mg, 12.5 mg, Intravenous, Q8H PRN, Elodia Florence., MD Allergies  Allergen Reactions  . Other Hives and Itching    Berries, TREE NUTS  . Peanut-Containing Drug Products Hives  . Shellfish Allergy Hives  . Bactrim Itching  . Orange Fruit Itching  . Sulfa Antibiotics Itching     Objective:     BP 130/80 (BP Location: Right Arm)   Pulse 80   Temp 97.9 F (36.6 C) (Oral)   Resp 18   Ht 6\' 1"   (1.854 m)   Wt 47.8 kg (105 lb 6.1 oz)   LMP  (LMP Unknown) Comment: negative beta HCG 12/29/17  SpO2 100%   BMI 13.90 kg/m   No distress  Heart regular rhythm no murmurs  Lungs clear.  Abdomen: Soft with some tenderness in the epigastrium  Laboratory No components found for: D1    Assessment:     #1. AIDS  #2. Acute pancreatitis etiology unclear. No evidence of choledocholithiasis.  #3. Jaundice and elevated liver enzymes suggesting hepatitis. With the elevation of her CMV quant this could be CMV hepatitis. Rule out other viral hepatitis.  #4. Dysphagia. History of esophageal candidiasis in the past. Currently on treatment with Diflucan IV. Rule out CMV esophagitis.      Plan:     Continue medical management for AIDS, and acute pancreatitis. Check a hepatitis panel for other types of hepatitis but if that is negative I suspect this is CMV hepatitis. In regards to dysphagia we will plan EGD to further investigate.

## 2018-01-01 NOTE — Care Management Note (Signed)
Case Management Note  Patient Details  Name: [name] MRN: [id number] Date of Birth: Jun 10, 1976  Subjective/Objective:41 y/o f admitted w/Pancreatitis. Hx: HIV AIDS, Candida esophagitis,GERD,bipolar.From home. Noted has Medicaid [place].                  Action/Plan:d/c home.   Expected Discharge Date:  (unknown)               Expected Discharge Plan:  Home/Self Care  In-House Referral:     Discharge planning Services  CM Consult  Post Acute Care Choice:    Choice offered to:  Patient  DME Arranged:    DME Agency:     HH Arranged:    [place] Agency:     Status of Service:  In process, will continue to follow  If discussed at Long Length of Stay Meetings, dates discussed:    Additional Comments:  Dessa Phi, RN 01/01/2018, 10:40 AM

## 2018-01-01 NOTE — Progress Notes (Addendum)
PROGRESS NOTE    [name]  XOV:[id number] DOB: 01-26-1976 DOA: 12/29/2017 PCP: Nolene Ebbs, MD   Brief Narrative:  [name] is a 42 y.o. female with medical history significant of HIV AIDS, Candida esophagitis, GERD, bipolar disorder, asthma, who comes in for 2 weeks of progressive worsening epigastric abdominal pain.  She had lab and imaging findings c/w pancreatitis.   Assessment & Plan:   Principal Problem:   Pancreatitis Active Problems:   HIV disease ([place])   Bipolar disorder ([place])   Hypokalemia   Esophageal stricture   Dysphagia   Candidal esophagitis ([facility])   Severe protein-calorie malnutrition ([facility])   Abdominal pain   Pancreatitis  Elevated Liver Enzymes: elevated bili and alk phos concerning for pancreatitis related to stone despite hx of cholecystectomy.  Discussed antiretroviral as possible etiology with ID, but thought less likely.  Triglycerides 400.   Bili persistently elevated, alk phos improving as well as AST and lipase.  Will check INR.    Continue clear liquid diet Antiemetics, analgesics IVF MRCP with findings c/w pancreatitis, but no biliary obstruction GI c/s, appreciate recs  HIV  AIDS:  ID c/s, appreciate recs Continue antiretrovirals Continue dapsone (pt allergic to bactrim)  Dysphagia  Candidal Esophagitis  Esophageal Stricture  Weight loss:   Discussed with ID, rec CMV quant (positive <200) and GI c/s to consider endoscopy and CMV esophagitis given history Dietician  Hematemesis vs hemoptysis:  Unclear etiology, she describes this relatively poorly.  Notes she feels like her throat is dry, then has to "clear throat" and brings up blood clots.  She notes occasional blood when blowing nose, could be related to this.  Alternatively could be related to possible esophagitis.  With presentation and history, lower suspicion for hemoptysis, but will follow up CXR (no active cardiopulm disease).  Continue to monitor.  GI c/s as above.        Anemia: suspect degree of dilution with IVF with pancreatitis, but with downtrending and at borderline will discuss transfusion with patient.  _0  hemoccult  Positive (no melena or hematochezia) S/p 1 unit pRBC  Thrombocytopenia: this has worsened since 2018.  Possibly related to HIV.  Will continue to monitor for now.  No indication for transfusion right now.     Worsened today, may need platelets prior to procedure  Bipolar disorder: continue home meds as able  Hypokalemia: f/u mg, replete prn.  K in IVF.   Severe Protein Calorie malnutrition: dietician  DVT prophylaxis: SCD Code Status: full code Family Communication: none at bedside Disposition Plan: pending  Consultants:   ID and GI  Procedures:   none  Antimicrobials:  Anti-infectives (From admission, onward)   Start     Dose/Rate Route Frequency Ordered Stop   12/31/17 2000  dapsone tablet 100 mg     100 mg Oral Daily 12/31/17 1859     12/31/17 1000  fluconazole (DIFLUCAN) IVPB 100 mg  Status:  Discontinued     100 mg 50 mL/hr over 60 Minutes Intravenous Every 24 hours 12/30/17 1001 12/30/17 1431   12/31/17 1000  fluconazole (DIFLUCAN) IVPB 400 mg     400 mg 100 mL/hr over 120 Minutes Intravenous Every 24 hours 12/30/17 1431     12/30/17 1100  fluconazole (DIFLUCAN) IVPB 200 mg     200 mg 100 mL/hr over 60 Minutes Intravenous  Once 12/30/17 1001 12/30/17 1220   12/30/17 1000  bictegravir-emtricitabine-tenofovir AF (BIKTARVY) 50-200-25 MG per tablet 1 tablet  1 tablet Oral Daily 12/30/17 0933        Subjective: Persistent abdominal pain. Also some diarrhea as well. Feels like "candida" is moving up her throat.   Objective: Vitals:   01/01/18 0010 01/01/18 0211 01/01/18 0603 01/01/18 1312  BP: 125/79 128/76 112/72 130/80  Pulse: 84 77 80 80  Resp: _0 Temp: 97.7 F (36.5 C) 98.2 F (36.8 C) 98.5 F (36.9 C) 97.9 F (36.6 C)  TempSrc: Oral Oral Oral Oral  SpO2: 100% 100% 100% 100%    Weight:   47.8 kg (105 lb 6.1 oz)   Height:        Intake/Output Summary (Last 24 hours) at 01/01/2018 1430 Last data filed at 01/01/2018 0934 Gross per 24 hour  Intake 3345.83 ml  Output -  Net 3345.83 ml   Filed Weights   12/29/17 2235 12/31/17 0538 01/01/18 0603  Weight: 43.1 kg (95 lb) 46.6 kg (102 lb 11.8 oz) 47.8 kg (105 lb 6.1 oz)    Examination:  General: No acute distress. Cardiovascular: Heart sounds show a regular rate, and rhythm. No gallops or rubs. No murmurs. No JVD. Lungs: Clear to auscultation bilaterally with good air movement. No rales, rhonchi or wheezes. Abdomen: Soft, diffusely tender, nondistended with normal active bowel sounds. No masses. No hepatosplenomegaly. Neurological: Alert and oriented 3. Moves all extremities 4 with equal strength. Cranial nerves II through XII grossly intact. Skin: Warm and dry. No rashes or lesions. Extremities: No clubbing or cyanosis. No edema.  Psychiatric: Mood and affect are normal. Insight and judgment are appropriate.    Data Reviewed: I have personally reviewed following labs and imaging studies  CBC: Recent Labs  Lab 12/29/17 1940 12/31/17 0727 12/31/17 1418 01/01/18 0534  WBC 3.3* 2.1*  --  2.0*  NEUTROABS  --  1.8  --   --   HGB 9.6* 7.4* 7.0* 7.9*  HCT 28.9* 23.1* 21.3* 23.5*  MCV 75.9* 78.6  --  78.9  PLT 38* 30*  --  25*   Basic Metabolic Panel: Recent Labs  Lab 12/29/17 1940 12/30/17 0700 12/30/17 1003 12/31/17 0727 01/01/18 0534  NA 139  --  142 142 141  K 2.3*  --  2.9* 2.8* 3.7  CL 101  --  110 115* 117*  CO2 25  --  24 21* 19*  GLUCOSE 169*  --  117* 95 86  BUN 19  --  _1 CREATININE 0.52  --  0.54 0.61 0.46  CALCIUM 9.1  --  8.0* 8.1* 8.4*  MG  --  2.0  --   --  1.8   GFR: Estimated Creatinine Clearance: 70.5 mL/min (by C-G formula based on SCr of 0.46 mg/dL). Liver Function Tests: Recent Labs  Lab 12/29/17 1940 12/31/17 0727 01/01/18 0534  AST 161* 106* 99*  ALT 37  23 21  ALKPHOS 944* 744* 672*  BILITOT 14.7* 11.6* 11.5*  PROT 7.1 5.7* 5.3*  ALBUMIN 2.6* 2.1* 1.9*   Recent Labs  Lab 12/29/17 1940 12/31/17 0727 01/01/18 0534  LIPASE 1,305* 809* 208*   No results for input(s): AMMONIA in the last 168 hours. Coagulation Profile: No results for input(s): INR, PROTIME in the last 168 hours. Cardiac Enzymes: No results for input(s): CKTOTAL, CKMB, CKMBINDEX, TROPONINI in the last 168 hours. BNP (last 3 results) No results for input(s): PROBNP in the last 8760 hours. HbA1C: No results for input(s): HGBA1C in the last 72 hours. CBG: No results for  input(s): GLUCAP in the last 168 hours. Lipid Profile: Recent Labs    12/30/17 1118  TRIG 400*   Thyroid Function Tests: No results for input(s): TSH, T4TOTAL, FREET4, T3FREE, THYROIDAB in the last 72 hours. Anemia Panel: No results for input(s): VITAMINB12, FOLATE, FERRITIN, TIBC, IRON, RETICCTPCT in the last 72 hours. Sepsis Labs: No results for input(s): PROCALCITON, LATICACIDVEN in the last 168 hours.  No results found for this or any previous visit (from the past 240 hour(s)).       Radiology Studies: Dg Chest 2 View  Result Date: 12/31/2017 CLINICAL DATA:  Nipple marker request EXAM: CHEST  2 VIEW COMPARISON:  1100 hours FINDINGS: Left basilar nodular density corresponds to the nipple marker. Allowing for nipple shadows, lungs are clear other than minimal subsegmental atelectasis for scarring at the left base. Lungs are clear. No pneumothorax or pleural effusion. Mild cardiomegaly. Normal vascularity. IMPRESSION: No active cardiopulmonary disease. Electronically Signed   By: Marybelle Killings M.D.   On: 12/31/2017 19:13   Mr 3d Recon At Scanner  Result Date: 12/31/2017 CLINICAL DATA:  Abnormal liver function tests. Elevated lipase. HIV. EXAM: MRI ABDOMEN WITHOUT AND WITH CONTRAST (INCLUDING MRCP) TECHNIQUE: Multiplanar multisequence MR imaging of the abdomen was performed both before and  after the administration of intravenous contrast. Heavily T2-weighted images of the biliary and pancreatic ducts were obtained, and three-dimensional MRCP images were rendered by post processing. CONTRAST:  76m MULTIHANCE GADOBENATE DIMEGLUMINE 529 MG/ML IV SOLN COMPARISON:  CT 12/30/2017 FINDINGS: Lower chest:  Lung bases are clear. Hepatobiliary: No intrahepatic duct dilatation. No focal hepatic lesion. No enhancing hepatic lesion. Postcholecystectomy. Common bile duct normal caliber. Pancreas: Mild edema associated with the pancreas. Pancreatic duct is normal caliber. No organized fluid collections. Small amount fluid along the LEFT gutter. Contrast enhanced imaging demonstrates uniform enhancement of the pancreas Spleen: Normal spleen. Adrenals/urinary tract: Adrenal glands and kidneys are normal. Stomach/Bowel: Stomach and limited of the small bowel is unremarkable Vascular/Lymphatic: There are enlarged lymph nodes the retroperitoneum along the aorta the kidneys. For example 12 mm and 8 mm short axis lymph nodes LEFT aorta on image 67, series 12/19/2001 Musculoskeletal: No aggressive osseous lesion IMPRESSION: 1. Mild pancreatic edema associated with pancreatitis. 2. Moderate intraperitoneal free fluid. No organized fluid collections. 3. No biliary obstruction.  Postcholecystectomy. 4. Mild retroperitoneal lymphadenopathy favor HIV adenopathy. Electronically Signed   By: SSuzy BouchardM.D.   On: 12/31/2017 17:36   Dg Chest Port 1 View  Result Date: 12/31/2017 CLINICAL DATA:  Shortness of breath, chest congestion, and productive cough. History of HIV, nonsmoker. EXAM: PORTABLE CHEST 1 VIEW COMPARISON:  Chest x-ray and chest CT scan of October 21, 2017 FINDINGS: The lungs are well-expanded. There is no focal infiltrate. There is a rounded approximately 1.5 cm diameter structure projecting over the posterolateral aspect of the left ninth rib which may reflect a nipple shadow. No similar finding is noted on  the right however. Some asymmetry of the nipples was noted on the CT scan of October 21, 2017. The apices appear clear. The heart and pulmonary vascularity are normal. The mediastinum is normal in width. No mediastinal or hilar lymphadenopathy is observed. There is no pleural effusion. The bony thorax is unremarkable. IMPRESSION: No acute infiltrate or definite mass. Presumed prominent nipple shadow on the left. A repeat chest x-ray with nipple markers and a lateral view would be useful to more completely evaluate the thorax in this patient with cryptogenic hemoptysis. These findings were discussed by telephone  with Dr. Florene Glen. Electronically Signed   By: David  Martinique M.D.   On: 12/31/2017 13:27   Mr Abdomen Mrcp Moise Boring Contast  Result Date: 12/31/2017 CLINICAL DATA:  Abnormal liver function tests. Elevated lipase. HIV. EXAM: MRI ABDOMEN WITHOUT AND WITH CONTRAST (INCLUDING MRCP) TECHNIQUE: Multiplanar multisequence MR imaging of the abdomen was performed both before and after the administration of intravenous contrast. Heavily T2-weighted images of the biliary and pancreatic ducts were obtained, and three-dimensional MRCP images were rendered by post processing. CONTRAST:  78m MULTIHANCE GADOBENATE DIMEGLUMINE 529 MG/ML IV SOLN COMPARISON:  CT 12/30/2017 FINDINGS: Lower chest:  Lung bases are clear. Hepatobiliary: No intrahepatic duct dilatation. No focal hepatic lesion. No enhancing hepatic lesion. Postcholecystectomy. Common bile duct normal caliber. Pancreas: Mild edema associated with the pancreas. Pancreatic duct is normal caliber. No organized fluid collections. Small amount fluid along the LEFT gutter. Contrast enhanced imaging demonstrates uniform enhancement of the pancreas Spleen: Normal spleen. Adrenals/urinary tract: Adrenal glands and kidneys are normal. Stomach/Bowel: Stomach and limited of the small bowel is unremarkable Vascular/Lymphatic: There are enlarged lymph nodes the retroperitoneum along  the aorta the kidneys. For example 12 mm and 8 mm short axis lymph nodes LEFT aorta on image 67, series 12/19/2001 Musculoskeletal: No aggressive osseous lesion IMPRESSION: 1. Mild pancreatic edema associated with pancreatitis. 2. Moderate intraperitoneal free fluid. No organized fluid collections. 3. No biliary obstruction.  Postcholecystectomy. 4. Mild retroperitoneal lymphadenopathy favor HIV adenopathy. Electronically Signed   By: SSuzy BouchardM.D.   On: 12/31/2017 17:36        Scheduled Meds: . benztropine  1 mg Oral Daily  . bictegravir-emtricitabine-tenofovir AF  1 tablet Oral Daily  . dapsone  100 mg Oral Daily  . feeding supplement  1 Container Oral TID BM  . multivitamin with minerals  1 tablet Oral Daily  . OLANZapine  5 mg Oral QHS   Continuous Infusions: . fluconazole (DIFLUCAN) IV Stopped (01/01/18 1137)  . lactated ringers with kcl 100 mL/hr at 01/01/18 1026     LOS: 2 days    Time spent: over 30 min    CFayrene Helper MD [facility] Pager [phone] If 7PM-7AM, please contact night-coverage [email or web address] Password [email or web address] 01/01/2018, 2:30 PM

## 2018-01-01 NOTE — Progress Notes (Signed)
CRITICAL VALUE ALERT  Critical Value:  Plt 25  Date & Time Notied:  01/01/18 0600  Provider Notified: Florene Glen notified via text page  Orders Received/Actions taken:

## 2018-01-02 ENCOUNTER — Encounter (HOSPITAL_COMMUNITY)
Admission: EM | Disposition: A | Discharge status: Home / Self Care | Payer: Self-pay | Source: Home / Self Care | Attending: Family Medicine

## 2018-01-02 ENCOUNTER — Inpatient Hospital Stay (HOSPITAL_COMMUNITY): Payer: Medicaid Other

## 2018-01-02 ENCOUNTER — Inpatient Hospital Stay (HOSPITAL_COMMUNITY): Payer: Medicaid Other | Admitting: Anesthesiology

## 2018-01-02 ENCOUNTER — Encounter (HOSPITAL_COMMUNITY): Payer: Self-pay | Admitting: *Deleted

## 2018-01-02 HISTORY — PX: ESOPHAGOGASTRODUODENOSCOPY (EGD) WITH PROPOFOL: SHX5813

## 2018-01-02 LAB — CBC WITH DIFFERENTIAL/PLATELET
Basophils Absolute: 0 10*3/uL (ref 0.0–0.1)
Basophils Relative: 1 %
EOS PCT: 2 %
Eosinophils Absolute: 0 10*3/uL (ref 0.0–0.7)
HEMATOCRIT: 21.6 % — AB (ref 36.0–46.0)
HEMOGLOBIN: 7.5 g/dL — AB (ref 12.0–15.0)
Lymphocytes Relative: 6 %
Lymphs Abs: 0.1 10*3/uL — ABNORMAL LOW (ref 0.7–4.0)
MCH: 26.8 pg (ref 26.0–34.0)
MCHC: 34.7 g/dL (ref 30.0–36.0)
MCV: 77.1 fL — ABNORMAL LOW (ref 78.0–100.0)
MONOS PCT: 9 %
Monocytes Absolute: 0.2 10*3/uL (ref 0.1–1.0)
Neutro Abs: 1.5 10*3/uL — ABNORMAL LOW (ref 1.7–7.7)
Neutrophils Relative %: 82 %
Platelets: 23 10*3/uL — CL (ref 150–400)
RBC: 2.8 MIL/uL — ABNORMAL LOW (ref 3.87–5.11)
RDW: 20.5 % — ABNORMAL HIGH (ref 11.5–15.5)
WBC Morphology: INCREASED
WBC: 1.8 10*3/uL — AB (ref 4.0–10.5)

## 2018-01-02 LAB — RESPIRATORY PANEL BY PCR
ADENOVIRUS-RVPPCR: NOT DETECTED
Bordetella pertussis: NOT DETECTED
CORONAVIRUS HKU1-RVPPCR: NOT DETECTED
CORONAVIRUS NL63-RVPPCR: NOT DETECTED
CORONAVIRUS OC43-RVPPCR: NOT DETECTED
Chlamydophila pneumoniae: NOT DETECTED
Coronavirus 229E: NOT DETECTED
INFLUENZA A-RVPPCR: NOT DETECTED
Influenza B: NOT DETECTED
METAPNEUMOVIRUS-RVPPCR: NOT DETECTED
Mycoplasma pneumoniae: NOT DETECTED
PARAINFLUENZA VIRUS 2-RVPPCR: NOT DETECTED
PARAINFLUENZA VIRUS 4-RVPPCR: NOT DETECTED
Parainfluenza Virus 1: NOT DETECTED
Parainfluenza Virus 3: NOT DETECTED
RHINOVIRUS / ENTEROVIRUS - RVPPCR: NOT DETECTED
Respiratory Syncytial Virus: NOT DETECTED

## 2018-01-02 LAB — HEPATIC FUNCTION PANEL
ALBUMIN: 1.7 g/dL — AB (ref 3.5–5.0)
ALK PHOS: 672 U/L — AB (ref 38–126)
ALT: 24 U/L (ref 14–54)
AST: 149 U/L — ABNORMAL HIGH (ref 15–41)
Bilirubin, Direct: 6.9 mg/dL — ABNORMAL HIGH (ref 0.1–0.5)
Indirect Bilirubin: 4.2 mg/dL — ABNORMAL HIGH (ref 0.3–0.9)
TOTAL PROTEIN: 4.9 g/dL — AB (ref 6.5–8.1)
Total Bilirubin: 11.1 mg/dL — ABNORMAL HIGH (ref 0.3–1.2)

## 2018-01-02 LAB — BASIC METABOLIC PANEL
Anion gap: 5 (ref 5–15)
CHLORIDE: 109 mmol/L (ref 101–111)
CO2: 18 mmol/L — ABNORMAL LOW (ref 22–32)
Calcium: 8.2 mg/dL — ABNORMAL LOW (ref 8.9–10.3)
Creatinine, Ser: 0.42 mg/dL — ABNORMAL LOW (ref 0.44–1.00)
GFR calc Af Amer: >60 mL/min (ref >60)
GFR calc non Af Amer: >60 mL/min (ref >60)
Glucose, Bld: 83 mg/dL (ref 65–99)
POTASSIUM: 4.3 mmol/L (ref 3.5–5.1)
SODIUM: 132 mmol/L — AB (ref 135–145)

## 2018-01-02 LAB — CBC
HCT: 22.3 % — ABNORMAL LOW (ref 36.0–46.0)
HEMOGLOBIN: 7.8 g/dL — AB (ref 12.0–15.0)
MCH: 26.8 pg (ref 26.0–34.0)
MCHC: 35 g/dL (ref 30.0–36.0)
MCV: 76.6 fL — ABNORMAL LOW (ref 78.0–100.0)
Platelets: 23 10*3/uL — CL (ref 150–400)
RBC: 2.91 MIL/uL — AB (ref 3.87–5.11)
RDW: 20.2 % — ABNORMAL HIGH (ref 11.5–15.5)
WBC: 1.7 10*3/uL — ABNORMAL LOW (ref 4.0–10.5)

## 2018-01-02 LAB — URINALYSIS, ROUTINE W REFLEX MICROSCOPIC
Glucose, UA: NEGATIVE mg/dL
Hgb urine dipstick: NEGATIVE
Ketones, ur: NEGATIVE mg/dL
LEUKOCYTES UA: NEGATIVE
Nitrite: NEGATIVE
PROTEIN: NEGATIVE mg/dL
SPECIFIC GRAVITY, URINE: 1.009 (ref 1.005–1.030)
pH: 6 (ref 5.0–8.0)

## 2018-01-02 LAB — LIPASE, BLOOD: LIPASE: 86 U/L — AB (ref 11–51)

## 2018-01-02 LAB — HEPATITIS PANEL, ACUTE
HEP B S AG: NEGATIVE
Hep A IgM: NEGATIVE
Hep B C IgM: NEGATIVE

## 2018-01-02 LAB — BRAIN NATRIURETIC PEPTIDE: B Natriuretic Peptide: 61.5 pg/mL (ref 0.0–100.0)

## 2018-01-02 LAB — PROTIME-INR
INR: 1.31
PROTHROMBIN TIME: 16.1 s — AB (ref 11.4–15.2)

## 2018-01-02 SURGERY — ESOPHAGOGASTRODUODENOSCOPY (EGD) WITH PROPOFOL
Anesthesia: Monitor Anesthesia Care

## 2018-01-02 MED ORDER — PANTOPRAZOLE SODIUM 40 MG IV SOLR
40.0000 mg | Freq: Two times a day (BID) | INTRAVENOUS | Status: DC
  Administered 2018-01-02 – 2018-01-11 (×19): 40 mg via INTRAVENOUS
  Filled 2018-01-02 (×19): qty 40

## 2018-01-02 MED ORDER — PROPOFOL 500 MG/50ML IV EMUL
INTRAVENOUS | Status: DC | PRN
  Administered 2018-01-02: 150 ug/kg/min via INTRAVENOUS

## 2018-01-02 MED ORDER — ONDANSETRON HCL 4 MG/2ML IJ SOLN: 4.0000 mg | Freq: Once | INTRAMUSCULAR | Status: DC | PRN

## 2018-01-02 MED ORDER — PROPOFOL 500 MG/50ML IV EMUL
INTRAVENOUS | Status: DC | PRN
  Administered 2018-01-02: 30 mg via INTRAVENOUS
  Administered 2018-01-02: 20 mg via INTRAVENOUS
  Administered 2018-01-02: 50 mg via INTRAVENOUS

## 2018-01-02 MED ORDER — ACETAMINOPHEN 325 MG PO TABS: 325.0000 mg | ORAL_TABLET | ORAL | Status: DC | PRN

## 2018-01-02 MED ORDER — OXYCODONE HCL 5 MG/5ML PO SOLN: 5.0000 mg | Freq: Once | ORAL | Status: DC | PRN

## 2018-01-02 MED ORDER — KETOROLAC TROMETHAMINE 30 MG/ML IJ SOLN: 30.0000 mg | Freq: Once | INTRAMUSCULAR | Status: DC | PRN

## 2018-01-02 MED ORDER — DEXTROSE 5 % IV SOLN
1.0000 g | Freq: Three times a day (TID) | INTRAVENOUS | Status: DC
  Administered 2018-01-02: 1 g via INTRAVENOUS
  Filled 2018-01-02 (×2): qty 1

## 2018-01-02 MED ORDER — LACTATED RINGERS IV SOLN
INTRAVENOUS | Status: DC | PRN
  Administered 2018-01-02: 08:00:00 via INTRAVENOUS

## 2018-01-02 MED ORDER — ACETAMINOPHEN 160 MG/5ML PO SOLN: 325.0000 mg | ORAL | Status: DC | PRN

## 2018-01-02 MED ORDER — GLYCOPYRROLATE 0.2 MG/ML IJ SOLN
INTRAMUSCULAR | Status: DC | PRN
  Administered 2018-01-02: 0.2 mg via INTRAVENOUS

## 2018-01-02 MED ORDER — VANCOMYCIN HCL 500 MG IV SOLR
500.0000 mg | Freq: Two times a day (BID) | INTRAVENOUS | Status: DC
  Filled 2018-01-02: qty 500

## 2018-01-02 MED ORDER — FENTANYL CITRATE (PF) 100 MCG/2ML IJ SOLN: 25.0000 ug | INTRAMUSCULAR | Status: DC | PRN

## 2018-01-02 MED ORDER — PROPOFOL 10 MG/ML IV BOLUS
INTRAVENOUS | Status: AC
  Filled 2018-01-02: qty 40

## 2018-01-02 MED ORDER — VANCOMYCIN HCL IN DEXTROSE 1-5 GM/200ML-% IV SOLN
1000.0000 mg | Freq: Once | INTRAVENOUS | Status: AC
  Administered 2018-01-02: 1000 mg via INTRAVENOUS

## 2018-01-02 MED ORDER — OXYCODONE HCL 5 MG PO TABS: 5.0000 mg | ORAL_TABLET | Freq: Once | ORAL | Status: DC | PRN

## 2018-01-02 MED ORDER — LIDOCAINE HCL (CARDIAC) 20 MG/ML IV SOLN
INTRAVENOUS | Status: DC | PRN
  Administered 2018-01-02: 60 mg via INTRAVENOUS

## 2018-01-02 MED ORDER — MEPERIDINE HCL 25 MG/ML IJ SOLN: 6.2500 mg | INTRAMUSCULAR | Status: DC | PRN

## 2018-01-02 SURGICAL SUPPLY — 15 items

## 2018-01-02 NOTE — Progress Notes (Signed)
Taking over care of patient. Agree with previous RN assessment. Denies any needs at this time. Will continue to monitor.  

## 2018-01-02 NOTE — H&P (Signed)
The patient is in the endoscopy department for EGD to evaluate dysphaia  No distress  Heart regular rhythm  Lungs clear  Abdomen soft and nontender  Impression dysphagia  Plan EGD

## 2018-01-02 NOTE — Op Note (Signed)
[facility] Patient Name: [name] Procedure Date: 01/02/2018 MRN: [id number] Attending MD: Wonda Horner , MD Date of Birth: 15-May-1976 CSN: [id number] Age: 42 Admit Type: Inpatient Procedure:                Upper GI endoscopy Indications:              Dysphagia Providers:                Wonda Horner, MD, Presley Raddle, RN, Elna Breslow, RN, Danford Bad, Technician,                            Gaston Islam, CRNA Referring MD:              Medicines:                Propofol per Anesthesia Complications:            No immediate complications. Estimated Blood Loss:     Estimated blood loss was minimal. Procedure:                Pre-Anesthesia Assessment:                           - Prior to the procedure, a History and Physical                            was performed, and patient medications and                            allergies were reviewed. The patient's tolerance of                            previous anesthesia was also reviewed. The risks                            and benefits of the procedure and the sedation                            options and risks were discussed with the patient.                            All questions were answered, and informed consent                            was obtained. Prior Anticoagulants: The patient has                            taken no previous anticoagulant or antiplatelet                            agents. ASA Grade Assessment: III - A patient with  severe systemic disease. After reviewing the risks                            and benefits, the patient was deemed in                            satisfactory condition to undergo the procedure.                           After obtaining informed consent, the endoscope was                            passed under direct vision. Throughout the                            procedure, the patient's blood pressure,  pulse, and                            oxygen saturations were monitored continuously. The                            [id number] ([id number]) scope was introduced through the                            mouth, and advanced to the lower third of                            esophagus. The [id number]) scope was                            introduced through the and advanced to the. The                            upper GI endoscopy was accomplished without                            difficulty. The patient tolerated the procedure                            well. Scope In: Scope Out: Findings:      One severe (stenosis; an endoscope cannot pass) stenosis was found.       Located in distal esophagus. Could not pass adult or pediatric scope. I       did not attempt dilation at this time because of the severe ulcerative       esophagitis which looks more like reflux induced,, and also her platlets       were 23000. She did get one unit of platlets just before the procedure.      Severe esophagitis with bleeding was found. Oozing. Looks more like       reflux. Biopsies were taken with a cold forceps for histology rule out       reflux or eosinophilic esophagitis.and also one specimen placed in       medium for CMV. Impression:               -  Esophageal stenosis.                           - Severe esophagitis. Biopsied. Moderate Sedation:      . Recommendation:           - Clear liquid diet.                           - Continue present medications.                           - Use Protonix (pantoprazole) 40 mg IV daily BID.                            Intensify PPI therapy for a few days, and elevate                            head of bed. Check biopsies. Will need repeat EGD                            with dilation after biopsies are checked and                            inflammation calms down and we try and make sure                            platlets are above 50,000. Procedure Code(s):         --- Professional ---                           [phone], Esophagoscopy, flexible, transoral; with                            biopsy, single or multiple Diagnosis Code(s):        --- Professional ---                           K22.2, Esophageal obstruction                           K20.9, Esophagitis, unspecified                           R13.10, Dysphagia, unspecified CPT copyright 2016 [employer] Medical [employer]. All rights reserved. The codes documented in this report are preliminary and upon coder review may  be revised to meet current compliance requirements. Wonda Horner, MD 01/02/2018 9:08:28 AM This report has been signed electronically. Number of Addenda: 0

## 2018-01-02 NOTE — Progress Notes (Addendum)
Pharmacy Antibiotic Note  [name] is a 42 y.o. female admitted on 12/29/2017 with pancreatitis, oral candidiasis, HIV positive.  Pharmacy has been consulted for Fluconazole dosing.    Day #4 fluconazole  Renal: SCr WNL  CBC: leukopenia, ANC = 1800 1/16  EGD today to evaluate esophagus, r/o CMV esophgatitis  Plan: Fluconazole 400mg  IV q24h - pharmacy to follow peripherally Fluconazole to po when can tolerate  ADDENDUM:  Pharmacy asked to dose vancomycin and cefepime.  She is having fevers and orders to start empiric broad spectrum antibiotics  vancomcyin 1gm x 1 then 500mg  IV q12h  Cefepime 1gm IV q8h  Check levels as indicated  Monitor renal function   Height: 6\' 1"  (185.4 cm) Weight: 105 lb 6.1 oz (47.8 kg) IBW/kg (Calculated) : 75.4  Temp (24hrs), Avg:99.3 F (37.4 C), Min:97.9 F (36.6 C), Max:100.7 F (38.2 C)  Recent Labs  Lab 12/29/17 1940 12/30/17 1003 12/31/17 0727 01/01/18 0534 01/02/18 0507  WBC 3.3*  --  2.1* 2.0* 1.7*  CREATININE 0.52 0.54 0.61 0.46 0.42*    Estimated Creatinine Clearance: 70.5 mL/min (A) (by C-G formula based on SCr of 0.42 mg/dL (L)).    Allergies  Allergen Reactions  . Other Hives and Itching    Berries, TREE NUTS  . Peanut-Containing Drug Products Hives  . Shellfish Allergy Hives  . Bactrim Itching  . Orange Fruit Itching  . Sulfa Antibiotics Itching    Antimicrobials this admission: 1/15 Fluconazole >>  1/18 vancomycin >> 1/18 cefepime >>  Microbiology results: None ordered U/A unremarkable 1/17 hepatitis panel:  1/18 Ucx 1/18 BCx  Thank you for allowing pharmacy to be a part of this patient's care.  Doreene Eland, PharmD, BCPS.   Pager: [phone] 01/02/2018 7:44 AM

## 2018-01-02 NOTE — Progress Notes (Addendum)
[facility] for Infectious Disease    Date of Admission:  12/29/2017   Total days of antibiotics 4        Day 4 fluconazole           ID: [name] is a 42 y.o. female with poorly controlled hiv disease, recently had lap chole but now admitted for dysphagia and pancreatitis Principal Problem:   Pancreatitis Active Problems:   HIV disease ([place])   Bipolar disorder ([place])   Hypokalemia   Esophageal stricture   Dysphagia   Candidal esophagitis (HCC)   Severe protein-calorie malnutrition ([place])   Abdominal pain    Subjective: Had isolated fever but asymptomatic. Starting to lake liquids. She does report that solid foods get stuck sensation. She had egd showing distal eso stricture and ulceration thought to be related to acid reflux. bx sent  Medications:  . benztropine  1 mg Oral Daily  . bictegravir-emtricitabine-tenofovir AF  1 tablet Oral Daily  . dapsone  100 mg Oral Daily  . feeding supplement  1 Container Oral TID BM  . multivitamin with minerals  1 tablet Oral Daily  . OLANZapine  5 mg Oral QHS  . pantoprazole (PROTONIX) IV  40 mg Intravenous Q12H    Objective: Vital signs in last 24 hours: Temp:  [97.9 F (36.6 C)-100.7 F (38.2 C)] 97.9 F (36.6 C) (01/18 0939) Pulse Rate:  [82-109] 93 (01/18 0939) Resp:  [16-21] 18 (01/18 0939) BP: (107-130)/(72-97) 121/82 (01/18 0939) SpO2:  [97 %-100 %] 98 % (01/18 0939) Physical Exam  Constitutional:  oriented to person, place, and time. appears frail, cachetic. No distress.  HENT: [place]/AT, PERRLA, no scleral icterus Mouth/Throat: Oropharynx is clear and moist. No oropharyngeal exudate. +thrush Cardiovascular: Normal rate, regular rhythm and normal heart sounds. Exam reveals no gallop and no friction rub.  No murmur heard.  Pulmonary/Chest: Effort normal and breath sounds normal. No respiratory distress.  has no wheezes.  Neck = supple, no nuchal rigidity Abdominal: Soft. Bowel sounds are normal.    Lymphadenopathy: no cervical adenopathy. No axillary adenopathy Neurological: alert and oriented to person, place, and time.  Skin: Skin is warm and dry. No rash noted. No erythema.  Psychiatric: a normal mood and affect.  behavior is normal.    Lab Results Recent Labs    01/01/18 0534 01/02/18 0507  WBC 2.0* 1.7*  HGB 7.9* 7.8*  HCT 23.5* 22.3*  NA 141 132*  K 3.7 4.3  CL 117* 109  CO2 19* 18*  BUN 9 <5*  CREATININE 0.46 0.42*   Liver Panel Recent Labs    01/01/18 0534 01/02/18 0507  PROT 5.3* 4.9*  ALBUMIN 1.9* 1.7*  AST 99* 149*  ALT 21 24  ALKPHOS 672* 672*  BILITOT 11.5* 11.1*  BILIDIR 7.0* 6.9*  IBILI 4.5* 4.2*    Microbiology: cx reveiwed and negative Studies/Results: Dg Chest 2 View  Result Date: 12/31/2017 CLINICAL DATA:  Nipple marker request EXAM: CHEST  2 VIEW COMPARISON:  1100 hours FINDINGS: Left basilar nodular density corresponds to the nipple marker. Allowing for nipple shadows, lungs are clear other than minimal subsegmental atelectasis for scarring at the left base. Lungs are clear. No pneumothorax or pleural effusion. Mild cardiomegaly. Normal vascularity. IMPRESSION: No active cardiopulmonary disease. Electronically Signed   By: Marybelle Killings M.D.   On: 12/31/2017 19:13   Mr 3d Recon At Scanner  Result Date: 12/31/2017 CLINICAL DATA:  Abnormal liver function tests. Elevated lipase. HIV. EXAM: MRI ABDOMEN  WITHOUT AND WITH CONTRAST (INCLUDING MRCP) TECHNIQUE: Multiplanar multisequence MR imaging of the abdomen was performed both before and after the administration of intravenous contrast. Heavily T2-weighted images of the biliary and pancreatic ducts were obtained, and three-dimensional MRCP images were rendered by post processing. CONTRAST:  69mL MULTIHANCE GADOBENATE DIMEGLUMINE 529 MG/ML IV SOLN COMPARISON:  CT 12/30/2017 FINDINGS: Lower chest:  Lung bases are clear. Hepatobiliary: No intrahepatic duct dilatation. No focal hepatic lesion. No  enhancing hepatic lesion. Postcholecystectomy. Common bile duct normal caliber. Pancreas: Mild edema associated with the pancreas. Pancreatic duct is normal caliber. No organized fluid collections. Small amount fluid along the LEFT gutter. Contrast enhanced imaging demonstrates uniform enhancement of the pancreas Spleen: Normal spleen. Adrenals/urinary tract: Adrenal glands and kidneys are normal. Stomach/Bowel: Stomach and limited of the small bowel is unremarkable Vascular/Lymphatic: There are enlarged lymph nodes the retroperitoneum along the aorta the kidneys. For example 12 mm and 8 mm short axis lymph nodes LEFT aorta on image 67, series 12/19/2001 Musculoskeletal: No aggressive osseous lesion IMPRESSION: 1. Mild pancreatic edema associated with pancreatitis. 2. Moderate intraperitoneal free fluid. No organized fluid collections. 3. No biliary obstruction.  Postcholecystectomy. 4. Mild retroperitoneal lymphadenopathy favor HIV adenopathy. Electronically Signed   By: Suzy Bouchard M.D.   On: 12/31/2017 17:36   Dg Chest Port 1 View  Result Date: 01/02/2018 CLINICAL DATA:  Fever.  Status post EGD. EXAM: PORTABLE CHEST 1 VIEW COMPARISON:  Chest x-ray dated December 31, 2017. FINDINGS: Stable mild cardiomegaly. Mild interstitial edema. Small bilateral pleural effusions with bibasilar atelectasis. No pneumothorax. No acute osseous abnormality. IMPRESSION: New mild interstitial pulmonary edema and small bilateral pleural effusions. Electronically Signed   By: Titus Dubin M.D.   On: 01/02/2018 10:09   Mr Abdomen Mrcp Moise Boring Contast  Result Date: 12/31/2017 CLINICAL DATA:  Abnormal liver function tests. Elevated lipase. HIV. EXAM: MRI ABDOMEN WITHOUT AND WITH CONTRAST (INCLUDING MRCP) TECHNIQUE: Multiplanar multisequence MR imaging of the abdomen was performed both before and after the administration of intravenous contrast. Heavily T2-weighted images of the biliary and pancreatic ducts were obtained, and  three-dimensional MRCP images were rendered by post processing. CONTRAST:  47mL MULTIHANCE GADOBENATE DIMEGLUMINE 529 MG/ML IV SOLN COMPARISON:  CT 12/30/2017 FINDINGS: Lower chest:  Lung bases are clear. Hepatobiliary: No intrahepatic duct dilatation. No focal hepatic lesion. No enhancing hepatic lesion. Postcholecystectomy. Common bile duct normal caliber. Pancreas: Mild edema associated with the pancreas. Pancreatic duct is normal caliber. No organized fluid collections. Small amount fluid along the LEFT gutter. Contrast enhanced imaging demonstrates uniform enhancement of the pancreas Spleen: Normal spleen. Adrenals/urinary tract: Adrenal glands and kidneys are normal. Stomach/Bowel: Stomach and limited of the small bowel is unremarkable Vascular/Lymphatic: There are enlarged lymph nodes the retroperitoneum along the aorta the kidneys. For example 12 mm and 8 mm short axis lymph nodes LEFT aorta on image 67, series 12/19/2001 Musculoskeletal: No aggressive osseous lesion IMPRESSION: 1. Mild pancreatic edema associated with pancreatitis. 2. Moderate intraperitoneal free fluid. No organized fluid collections. 3. No biliary obstruction.  Postcholecystectomy. 4. Mild retroperitoneal lymphadenopathy favor HIV adenopathy. Electronically Signed   By: Suzy Bouchard M.D.   On: 12/31/2017 17:36     Assessment/Plan: Pancreatitis = appears improving by serologic markers but patient still feels that she has abdominal pain. Defer to primary team for management. Likely exacerbated by pancreatitis.   Diarrhea = likely due to liquid diet, would not test for cdifficile  hiv disease = continue on biktarvy  Ulcerative esophagitis = await bx  results to see if cmv vs. Herpes. Continue with IV pantoprazole. Continue on fluconazole for eso candidiasis  Mild transaminitis = probably related to pancreatitis that is resolving plus occ can see increase with fluconazole. Can consider changing to anidulafungin if it  worsens  Fever = isolated. No need to pull empiric treatment at this time. Will follow to see if she has consistent fevers or if has leukocytosis. I do not think that the isolated fever was significant. Would d/c cefepime and vanco.  Discussed plan with dr Florene Glen this afternoon  [facility] for Infectious Diseases Cell: [phone] Pager: [phone]  01/02/2018, 3:29 PM

## 2018-01-02 NOTE — Progress Notes (Signed)
PROGRESS NOTE    [name]  XNA:[id number] DOB: 05-03-1976 DOA: 12/29/2017 PCP: Madison Ebbs, MD   Brief Narrative:  [name] is [name] 42 y.o. female with medical history significant of HIV AIDS, Candida esophagitis, GERD, bipolar disorder, asthma, who comes in for 2 weeks of progressive worsening epigastric abdominal pain.  She had lab and imaging findings c/w pancreatitis.   Assessment & Plan:   Principal Problem:   Pancreatitis Active Problems:   HIV disease ([place])   Bipolar disorder ([place])   Hypokalemia   Esophageal stricture   Dysphagia   Candidal esophagitis (HCC)   Severe protein-calorie malnutrition (H[facility])   Abdominal pain   Pancreatitis  Elevated Liver Enzymes: elevated bili and alk phos concerning for pancreatitis related to stone despite hx of cholecystectomy.  Discussed antiretroviral as possible etiology with ID, but thought less likely.  Triglycerides 400.   Bili persistently elevated, alk phos improving as well as AST and lipase.  Will check INR.    Continue clear liquid diet Antiemetics, analgesics IVF MRCP with findings c/w pancreatitis, but no biliary obstruction GI c/s, appreciate recs  Hepatitis: elevated LFTs as noted above (AST elevated more today, bili stable).  MRCP without obstruction.  Acute hepatitis panel negative.  CMV positive (<200 iu/ml, not sure of significance of this level, seems low, will discuss with ID).  Consider CMV hepatitis per GI.    Fever:  Pt appears stable.  CXR with possible interstitial edema.  Will d/c IVF, check BNP.  Pt noted new cough last night.  Will check blood cx, urine cx, RVP, and c diff with diarrhea.  Abdominal exam tender, but seems relatively stable over the past few days.  Will have low threshold for reimaging with recent pancreatitis.  Will start broad spectrum abx for now, narrow as able.    Esophagitis: EGD with severe esophagitis and esophageal stenosis.  IV PPI BID, biopsies pending.  Needs dilation after  biopsies result.   HIV  AIDS:  ID c/s, appreciate recs Continue antiretrovirals Continue dapsone (pt allergic to bactrim)  Dysphagia  Candidal Esophagitis  Esophageal Stricture  Weight loss:   Discussed with ID, rec CMV quant (positive <200) and GI c/s to consider endoscopy and CMV esophagitis given history As above Dietician  Hematemesis:  Suspect this was related to severe esophagitis as noted above.  PPI BID.       Anemia: suspect degree of dilution with IVF with pancreatitis, but with downtrending and at borderline will discuss transfusion with patient.  [ ]  hemoccult  Positive (no melena or hematochezia) S/p 1 unit pRBC  Thrombocytopenia: this has worsened since 2018.  Possibly related to HIV.  Will continue to monitor for now.  S/p 1 unit platelets 1/18 prior to EGD  Bipolar disorder: continue home meds as able  Hypokalemia: f/u mg, replete prn.  K in IVF.   Severe Protein Calorie malnutrition: dietician  NAGMA: continue to monitor, possibly related to IVF, will discontinued for now  DVT prophylaxis: SCD Code Status: full code Family Communication: none at bedside Disposition Plan: pending  Consultants:   ID and GI  Procedures:  EGD 1/18 Impression: Esophageal stenosis. Severe esophagitis. Biopsied. Moderate Sedation: - Clear liquid diet. - Continue present medications. - Use Protonix (pantoprazole) 40 mg IV daily BID. Intensify PPI therapy for [name] few days, and elevate head of bed. Check biopsies. Will need repeat EGD with dilation after biopsies are checked and inflammation calms down and we try and make sure platlets are  above 50,000.  Antimicrobials:  Anti-infectives (From admission, onward)   Start     Dose/Rate Route Frequency Ordered Stop   12/31/17 2000  dapsone tablet 100 mg     100 mg Oral Daily 12/31/17 1859     12/31/17 1000  fluconazole (DIFLUCAN) IVPB 100 mg  Status:  Discontinued     100 mg 50 mL/hr over 60 Minutes Intravenous Every 24  hours 12/30/17 1001 12/30/17 1431   12/31/17 1000  fluconazole (DIFLUCAN) IVPB 400 mg     400 mg 100 mL/hr over 120 Minutes Intravenous Every 24 hours 12/30/17 1431     12/30/17 1100  fluconazole (DIFLUCAN) IVPB 200 mg     200 mg 100 mL/hr over 60 Minutes Intravenous  Once 12/30/17 1001 12/30/17 1220   12/30/17 1000  bictegravir-emtricitabine-tenofovir AF (BIKTARVY) 50-200-25 MG per tablet 1 tablet     1 tablet Oral Daily 12/30/17 0933        Subjective: Soreness in throat. Fever this morning.  Abdominal pain is about the same.   Objective: Vitals:   01/02/18 0915 01/02/18 0920 01/02/18 0925 01/02/18 0939  BP:  119/82  121/82  Pulse: 91 91 97 93  Resp: 16 16 (!) 21 18  Temp:    97.9 F (36.6 C)  TempSrc:    Oral  SpO2: 100% 100% 97% 98%  Weight:      Height:        Intake/Output Summary (Last 24 hours) at 01/02/2018 1042 Last data filed at 01/02/2018 0844 Gross per 24 hour  Intake 3372 ml  Output -  Net 3372 ml   Filed Weights   12/29/17 2235 12/31/17 0538 01/01/18 0603  Weight: 43.1 kg (95 lb) 46.6 kg (102 lb 11.8 oz) 47.8 kg (105 lb 6.1 oz)    Examination:  General: No acute distress. Cardiovascular: Heart sounds show Daryll Spisak regular rate, and rhythm. No gallops or rubs. No murmurs. No JVD. Lungs: Clear to auscultation bilaterally with good air movement. No rales, rhonchi or wheezes. Abdomen: Soft, diffusely tender to palpation(stable), no rebound or guarding, nondistended with normal active bowel sounds. No masses. No hepatosplenomegaly. Neurological: Alert and oriented 3. Moves all extremities 4. Cranial nerves II through XII grossly intact. Skin: Warm and dry. No rashes or lesions. Extremities: No clubbing or cyanosis. No edema.  Psychiatric: Mood and affect are normal. Insight and judgment are appropriate.  Data Reviewed: I have personally reviewed following labs and imaging studies  CBC: Recent Labs  Lab 12/29/17 1940 12/31/17 0727 12/31/17 1418  01/01/18 0534 01/02/18 0507  WBC 3.3* 2.1*  --  2.0* 1.7*  NEUTROABS  --  1.8  --   --   --   HGB 9.6* 7.4* 7.0* 7.9* 7.8*  HCT 28.9* 23.1* 21.3* 23.5* 22.3*  MCV 75.9* 78.6  --  78.9 76.6*  PLT 38* 30*  --  25* 23*   Basic Metabolic Panel: Recent Labs  Lab 12/29/17 1940 12/30/17 0700 12/30/17 1003 12/31/17 0727 01/01/18 0534 01/02/18 0507  NA 139  --  142 142 141 132*  K 2.3*  --  2.9* 2.8* 3.7 4.3  CL 101  --  110 115* 117* 109  CO2 25  --  24 21* 19* 18*  GLUCOSE 169*  --  117* 95 86 83  BUN 19  --  18 14 9  <5*  CREATININE 0.52  --  0.54 0.61 0.46 0.42*  CALCIUM 9.1  --  8.0* 8.1* 8.4* 8.2*  MG  --  2.0  --   --  1.8  --    GFR: Estimated Creatinine Clearance: 70.5 mL/min (Beth Goodlin) (by C-G formula based on SCr of 0.42 mg/dL (L)). Liver Function Tests: Recent Labs  Lab 12/29/17 1940 12/31/17 0727 01/01/18 0534 01/02/18 0507  AST 161* 106* 99* 149*  ALT 37 23 21 24   ALKPHOS 944* 744* 672* 672*  BILITOT 14.7* 11.6* 11.5* 11.1*  PROT 7.1 5.7* 5.3* 4.9*  ALBUMIN 2.6* 2.1* 1.9* 1.7*   Recent Labs  Lab 12/29/17 1940 12/31/17 0727 01/01/18 0534 01/02/18 0725  LIPASE 1,305* 809* 208* 86*   No results for input(s): AMMONIA in the last 168 hours. Coagulation Profile: Recent Labs  Lab 01/01/18 1522 01/02/18 0507  INR 1.29 1.31   Cardiac Enzymes: No results for input(s): CKTOTAL, CKMB, CKMBINDEX, TROPONINI in the last 168 hours. BNP (last 3 results) No results for input(s): PROBNP in the last 8760 hours. HbA1C: No results for input(s): HGBA1C in the last 72 hours. CBG: No results for input(s): GLUCAP in the last 168 hours. Lipid Profile: Recent Labs    12/30/17 1118  TRIG 400*   Thyroid Function Tests: No results for input(s): TSH, T4TOTAL, FREET4, T3FREE, THYROIDAB in the last 72 hours. Anemia Panel: No results for input(s): VITAMINB12, FOLATE, FERRITIN, TIBC, IRON, RETICCTPCT in the last 72 hours. Sepsis Labs: No results for input(s): PROCALCITON,  LATICACIDVEN in the last 168 hours.  No results found for this or any previous visit (from the past 240 hour(s)).       Radiology Studies: Dg Chest 2 View  Result Date: 12/31/2017 CLINICAL DATA:  Nipple marker request EXAM: CHEST  2 VIEW COMPARISON:  1100 hours FINDINGS: Left basilar nodular density corresponds to the nipple marker. Allowing for nipple shadows, lungs are clear other than minimal subsegmental atelectasis for scarring at the left base. Lungs are clear. No pneumothorax or pleural effusion. Mild cardiomegaly. Normal vascularity. IMPRESSION: No active cardiopulmonary disease. Electronically Signed   By: Marybelle Killings M.D.   On: 12/31/2017 19:13   Mr 3d Recon At Scanner  Result Date: 12/31/2017 CLINICAL DATA:  Abnormal liver function tests. Elevated lipase. HIV. EXAM: MRI ABDOMEN WITHOUT AND WITH CONTRAST (INCLUDING MRCP) TECHNIQUE: Multiplanar multisequence MR imaging of the abdomen was performed both before and after the administration of intravenous contrast. Heavily T2-weighted images of the biliary and pancreatic ducts were obtained, and three-dimensional MRCP images were rendered by post processing. CONTRAST:  22m MULTIHANCE GADOBENATE DIMEGLUMINE 529 MG/ML IV SOLN COMPARISON:  CT 12/30/2017 FINDINGS: Lower chest:  Lung bases are clear. Hepatobiliary: No intrahepatic duct dilatation. No focal hepatic lesion. No enhancing hepatic lesion. Postcholecystectomy. Common bile duct normal caliber. Pancreas: Mild edema associated with the pancreas. Pancreatic duct is normal caliber. No organized fluid collections. Small amount fluid along the LEFT gutter. Contrast enhanced imaging demonstrates uniform enhancement of the pancreas Spleen: Normal spleen. Adrenals/urinary tract: Adrenal glands and kidneys are normal. Stomach/Bowel: Stomach and limited of the small bowel is unremarkable Vascular/Lymphatic: There are enlarged lymph nodes the retroperitoneum along the aorta the kidneys. For example 12  mm and 8 mm short axis lymph nodes LEFT aorta on image 67, series 12/19/2001 Musculoskeletal: No aggressive osseous lesion IMPRESSION: 1. Mild pancreatic edema associated with pancreatitis. 2. Moderate intraperitoneal free fluid. No organized fluid collections. 3. No biliary obstruction.  Postcholecystectomy. 4. Mild retroperitoneal lymphadenopathy favor HIV adenopathy. Electronically Signed   By: SSuzy BouchardM.D.   On: 12/31/2017 17:36   Dg [id number] View  Result  Date: 01/02/2018 CLINICAL DATA:  Fever.  Status post EGD. EXAM: PORTABLE CHEST 1 VIEW COMPARISON:  Chest x-ray dated December 31, 2017. FINDINGS: Stable mild cardiomegaly. Mild interstitial edema. Small bilateral pleural effusions with bibasilar atelectasis. No pneumothorax. No acute osseous abnormality. IMPRESSION: New mild interstitial pulmonary edema and small bilateral pleural effusions. Electronically Signed   By: Titus Dubin M.D.   On: 01/02/2018 10:09   Dg Chest Port 1 View  Result Date: 12/31/2017 CLINICAL DATA:  Shortness of breath, chest congestion, and productive cough. History of HIV, nonsmoker. EXAM: PORTABLE CHEST 1 VIEW COMPARISON:  Chest x-ray and chest CT scan of October 21, 2017 FINDINGS: The lungs are well-expanded. There is no focal infiltrate. There is Kamyrah Feeser rounded approximately 1.5 cm diameter structure projecting over the posterolateral aspect of the left ninth rib which may reflect Emoni Yang nipple shadow. No similar finding is noted on the right however. Some asymmetry of the nipples was noted on the CT scan of October 21, 2017. The apices appear clear. The heart and pulmonary vascularity are normal. The mediastinum is normal in width. No mediastinal or hilar lymphadenopathy is observed. There is no pleural effusion. The bony thorax is unremarkable. IMPRESSION: No acute infiltrate or definite mass. Presumed prominent nipple shadow on the left. Chane Magner repeat chest x-ray with nipple markers and Habeeb Puertas lateral view would be useful to  more completely evaluate the thorax in this patient with cryptogenic hemoptysis. These findings were discussed by telephone with Dr. Florene Glen. Electronically Signed   By: David  Martinique M.D.   On: 12/31/2017 13:27   Mr Abdomen Mrcp Moise Boring Contast  Result Date: 12/31/2017 CLINICAL DATA:  Abnormal liver function tests. Elevated lipase. HIV. EXAM: MRI ABDOMEN WITHOUT AND WITH CONTRAST (INCLUDING MRCP) TECHNIQUE: Multiplanar multisequence MR imaging of the abdomen was performed both before and after the administration of intravenous contrast. Heavily T2-weighted images of the biliary and pancreatic ducts were obtained, and three-dimensional MRCP images were rendered by post processing. CONTRAST:  38m MULTIHANCE GADOBENATE DIMEGLUMINE 529 MG/ML IV SOLN COMPARISON:  CT 12/30/2017 FINDINGS: Lower chest:  Lung bases are clear. Hepatobiliary: No intrahepatic duct dilatation. No focal hepatic lesion. No enhancing hepatic lesion. Postcholecystectomy. Common bile duct normal caliber. Pancreas: Mild edema associated with the pancreas. Pancreatic duct is normal caliber. No organized fluid collections. Small amount fluid along the LEFT gutter. Contrast enhanced imaging demonstrates uniform enhancement of the pancreas Spleen: Normal spleen. Adrenals/urinary tract: Adrenal glands and kidneys are normal. Stomach/Bowel: Stomach and limited of the small bowel is unremarkable Vascular/Lymphatic: There are enlarged lymph nodes the retroperitoneum along the aorta the kidneys. For example 12 mm and 8 mm short axis lymph nodes LEFT aorta on image 67, series 12/19/2001 Musculoskeletal: No aggressive osseous lesion IMPRESSION: 1. Mild pancreatic edema associated with pancreatitis. 2. Moderate intraperitoneal free fluid. No organized fluid collections. 3. No biliary obstruction.  Postcholecystectomy. 4. Mild retroperitoneal lymphadenopathy favor HIV adenopathy. Electronically Signed   By: SSuzy BouchardM.D.   On: 12/31/2017 17:36         Scheduled Meds: . benztropine  1 mg Oral Daily  . bictegravir-emtricitabine-tenofovir AF  1 tablet Oral Daily  . dapsone  100 mg Oral Daily  . feeding supplement  1 Container Oral TID BM  . multivitamin with minerals  1 tablet Oral Daily  . OLANZapine  5 mg Oral QHS  . pantoprazole (PROTONIX) IV  40 mg Intravenous Q12H   Continuous Infusions: . sodium chloride    . fluconazole (DIFLUCAN) IV 400 mg (  01/02/18 1026)  . lactated ringers with kcl 100 mL/hr at 01/02/18 0017     LOS: 3 days    Time spent: over 51 min    Fayrene Helper, MD [facility] Pager [phone]  If 7PM-7AM, please contact night-coverage [email or web address] Password [facility] 01/02/2018, 10:42 AM

## 2018-01-02 NOTE — Anesthesia Postprocedure Evaluation (Signed)
Anesthesia Post Note  Patient: [name]  Procedure(s) Performed: ESOPHAGOGASTRODUODENOSCOPY (EGD) WITH PROPOFOL (N/A )     Patient location during evaluation: PACU Anesthesia Type: MAC Level of consciousness: awake and alert Pain management: pain level controlled Vital Signs Assessment: post-procedure vital signs reviewed and stable Respiratory status: spontaneous breathing, nonlabored ventilation, respiratory function stable and patient connected to nasal cannula oxygen Cardiovascular status: stable and blood pressure returned to baseline Postop Assessment: no apparent nausea or vomiting Anesthetic complications: no    Last Vitals:  Vitals:   01/02/18 0925 01/02/18 0939  BP:  121/82  Pulse: 97 93  Resp: (!) 21 18  Temp:  36.6 C  SpO2: 97% 98%    Last Pain:  Vitals:   01/02/18 0939  TempSrc: Oral  PainSc:                  [name]

## 2018-01-02 NOTE — Progress Notes (Signed)
PT Cancellation Note  Patient Details Name: [name] MRN: [id number] DOB: [birth year]   Cancelled Treatment:    Reason Eval/Treat Not Completed: Patient at procedure or test/unavailable   [facility] 01/02/2018, 8:25 AM

## 2018-01-02 NOTE — Transfer of Care (Signed)
Immediate Anesthesia Transfer of Care Note  Patient: [name]  Procedure(s) Performed: ESOPHAGOGASTRODUODENOSCOPY (EGD) WITH PROPOFOL (N/A )  Patient Location: PACU  Anesthesia Type:MAC  Level of Consciousness: awake, alert  and oriented  Airway & Oxygen Therapy: Patient Spontanous Breathing and Patient connected to nasal cannula oxygen  Post-op Assessment: Report given to RN and Post -op Vital signs reviewed and stable  Post vital signs: Reviewed and stable  Last Vitals:  Vitals:   01/02/18 0740 01/02/18 0750  BP: 111/82 126/80  Pulse: 98 93  Resp: 18 19  Temp: 37.3 C 37.3 C  SpO2: 98% 98%    Last Pain:  Vitals:   01/02/18 0750  TempSrc: Oral  PainSc: 6       Patients Stated Pain Goal: 4 (39/03/00 9233)  Complications: No apparent anesthesia complications

## 2018-01-03 LAB — CBC WITH DIFFERENTIAL/PLATELET
BASOS ABS: 0 10*3/uL (ref 0.0–0.1)
Basophils Relative: 1 %
EOS PCT: 2 %
Eosinophils Absolute: 0 10*3/uL (ref 0.0–0.7)
HCT: 23.3 % — ABNORMAL LOW (ref 36.0–46.0)
Hemoglobin: 7.9 g/dL — ABNORMAL LOW (ref 12.0–15.0)
Lymphocytes Relative: 7 %
Lymphs Abs: 0.1 10*3/uL — ABNORMAL LOW (ref 0.7–4.0)
MCH: 25.9 pg — ABNORMAL LOW (ref 26.0–34.0)
MCHC: 33.9 g/dL (ref 30.0–36.0)
MCV: 76.4 fL — AB (ref 78.0–100.0)
MONO ABS: 0.3 10*3/uL (ref 0.1–1.0)
Monocytes Relative: 14 %
NEUTROS PCT: 76 %
Neutro Abs: 1.5 10*3/uL — ABNORMAL LOW (ref 1.7–7.7)
PLATELETS: 24 10*3/uL — AB (ref 150–400)
RBC: 3.05 MIL/uL — AB (ref 3.87–5.11)
RDW: 20.7 % — ABNORMAL HIGH (ref 11.5–15.5)
WBC: 1.9 10*3/uL — ABNORMAL LOW (ref 4.0–10.5)

## 2018-01-03 LAB — OSMOLALITY, URINE: OSMOLALITY UR: 401 mosm/kg (ref 300–900)

## 2018-01-03 LAB — HEPATIC FUNCTION PANEL
ALT: 35 U/L (ref 14–54)
AST: 213 U/L — AB (ref 15–41)
Albumin: 1.8 g/dL — ABNORMAL LOW (ref 3.5–5.0)
Alkaline Phosphatase: 688 U/L — ABNORMAL HIGH (ref 38–126)
Bilirubin, Direct: 8.3 mg/dL — ABNORMAL HIGH (ref 0.1–0.5)
Indirect Bilirubin: 5.1 mg/dL — ABNORMAL HIGH (ref 0.3–0.9)
TOTAL PROTEIN: 5.1 g/dL — AB (ref 6.5–8.1)
Total Bilirubin: 13.4 mg/dL — ABNORMAL HIGH (ref 0.3–1.2)

## 2018-01-03 LAB — BASIC METABOLIC PANEL
ANION GAP: 6 (ref 5–15)
BUN: 5 mg/dL — ABNORMAL LOW (ref 6–20)
CALCIUM: 8 mg/dL — AB (ref 8.9–10.3)
CO2: 19 mmol/L — ABNORMAL LOW (ref 22–32)
Chloride: 103 mmol/L (ref 101–111)
Creatinine, Ser: 0.37 mg/dL — ABNORMAL LOW (ref 0.44–1.00)
GFR calc Af Amer: >60 mL/min (ref >60)
Glucose, Bld: 89 mg/dL (ref 65–99)
POTASSIUM: 4.2 mmol/L (ref 3.5–5.1)
SODIUM: 128 mmol/L — AB (ref 135–145)

## 2018-01-03 LAB — BPAM PLATELET PHERESIS
Blood Product Expiration Date: 201901202359
ISSUE DATE / TIME: 201901180713
Unit Type and Rh: 600

## 2018-01-03 LAB — PREPARE PLATELET PHERESIS: Unit division: 0

## 2018-01-03 LAB — SODIUM, URINE, RANDOM: SODIUM UR: 88 mmol/L

## 2018-01-03 LAB — LIPASE, BLOOD: Lipase: 65 U/L — ABNORMAL HIGH (ref 11–51)

## 2018-01-03 MED ORDER — PROMETHAZINE HCL 25 MG/ML IJ SOLN: 6.2500 mg | Freq: Three times a day (TID) | INTRAMUSCULAR | Status: DC | PRN

## 2018-01-03 MED ORDER — LACTATED RINGERS IV SOLN
INTRAVENOUS | Status: DC
  Administered 2018-01-03 – 2018-01-06 (×5): via INTRAVENOUS

## 2018-01-03 MED ORDER — NYSTATIN 100000 UNIT/ML MT SUSP
5.0000 mL | Freq: Four times a day (QID) | OROMUCOSAL | Status: DC
  Administered 2018-01-03 – 2018-01-12 (×31): 500000 [IU] via ORAL
  Filled 2018-01-03 (×31): qty 5

## 2018-01-03 MED ORDER — SUCRALFATE 1 GM/10ML PO SUSP
1.0000 g | Freq: Three times a day (TID) | ORAL | Status: DC
  Administered 2018-01-03 – 2018-01-12 (×32): 1 g via ORAL
  Filled 2018-01-03 (×32): qty 10

## 2018-01-03 NOTE — Progress Notes (Addendum)
Subjective: The patient was seen and examined at bedside. She is on a full liquid diet. Complains of mild but diffuse abdominal pain and continues to have difficulty swallowing, less so with liquids.  Objective: Vital signs in last 24 hours: Temp:  [98.9 F (37.2 C)-99.4 F (37.4 C)] 99.4 F (37.4 C) (01/19 0655) Pulse Rate:  [95-104] 104 (01/19 0655) Resp:  [18] 18 (01/19 0655) BP: (115-123)/(76) 123/76 (01/19 0655) SpO2:  [96 %-97 %] 97 % (01/19 0655) Weight:  [47.8 kg (105 lb 6.1 oz)] 47.8 kg (105 lb 6.1 oz) (01/19 0655) Weight change:  Last BM Date: 01/03/18  PE: Obvious pallor, obvious icterus GENERAL: Appears frail, thinly built, not in acute distress ABDOMEN: Soft but mild diffuse tenderness noted, no rebound tenderness, normoactive bowel sounds EXTREMITIES: No edema  Lab Results: Results for orders placed or performed during the hospital encounter of 12/29/17 (from the past 48 hour(s))  Protime-INR     Status: Abnormal   Collection Time: 01/01/18  3:22 PM  Result Value Ref Range   Prothrombin Time 16.0 (H) 11.4 - 15.2 seconds   INR 1.29   Hepatitis panel, acute     Status: None   Collection Time: 01/01/18  3:45 PM  Result Value Ref Range   Hepatitis B Surface Ag Negative Negative   HCV Ab <0.1 0.0 - 0.9 s/co ratio    Comment: (NOTE)                                  Negative:     < 0.8                             Indeterminate: 0.8 - 0.9                                  Positive:     > 0.9 The CDC recommends that a positive HCV antibody result be followed up with a HCV Nucleic Acid Amplification test (176160). Performed At: [facility] [place], [place] [id number] Rush Farmer MD [id number]    Hep A IgM Negative Negative   Hep B C IgM Negative Negative  Prepare Pheresed Platelets     Status: None   Collection Time: 01/02/18  5:07 AM  Result Value Ref Range   Unit Number [id number]    Blood Component Type PLTP LR2 PAS    Unit  division 00    Status of Unit ISSUED,FINAL    Transfusion Status OK TO TRANSFUSE   CBC     Status: Abnormal   Collection Time: 01/02/18  5:07 AM  Result Value Ref Range   WBC 1.7 (L) 4.0 - 10.5 K/uL   RBC 2.91 (L) 3.87 - 5.11 MIL/uL   Hemoglobin 7.8 (L) 12.0 - 15.0 g/dL   HCT 22.3 (L) 36.0 - 46.0 %   MCV 76.6 (L) 78.0 - 100.0 fL   MCH 26.8 26.0 - 34.0 pg   MCHC 35.0 30.0 - 36.0 g/dL   RDW 20.2 (H) 11.5 - 15.5 %   Platelets 23 (LL) 150 - 400 K/uL    Comment: CRITICAL VALUE NOTED.  VALUE IS CONSISTENT WITH PREVIOUSLY REPORTED AND CALLED VALUE.  Hepatic function panel     Status: Abnormal   Collection Time: 01/02/18  5:07 AM  Result Value  Ref Range   Total Protein 4.9 (L) 6.5 - 8.1 g/dL   Albumin 1.7 (L) 3.5 - 5.0 g/dL   AST 149 (H) 15 - 41 U/L   ALT 24 14 - 54 U/L   Alkaline Phosphatase 672 (H) 38 - 126 U/L   Total Bilirubin 11.1 (H) 0.3 - 1.2 mg/dL   Bilirubin, Direct 6.9 (H) 0.1 - 0.5 mg/dL   Indirect Bilirubin 4.2 (H) 0.3 - 0.9 mg/dL  Basic metabolic panel     Status: Abnormal   Collection Time: 01/02/18  5:07 AM  Result Value Ref Range   Sodium 132 (L) 135 - 145 mmol/L    Comment: DELTA CHECK NOTED   Potassium 4.3 3.5 - 5.1 mmol/L   Chloride 109 101 - 111 mmol/L   CO2 18 (L) 22 - 32 mmol/L   Glucose, Bld 83 65 - 99 mg/dL   BUN <5 (L) 6 - 20 mg/dL   Creatinine, Ser 0.42 (L) 0.44 - 1.00 mg/dL   Calcium 8.2 (L) 8.9 - 10.3 mg/dL   GFR calc non Af Amer >60 >60 mL/min   GFR calc Af Amer >60 >60 mL/min    Comment: (NOTE) The eGFR has been calculated using the CKD EPI equation. This calculation has not been validated in all clinical situations. eGFR's persistently <60 mL/min signify possible Chronic Kidney Disease.    Anion gap 5 5 - 15  Protime-INR     Status: Abnormal   Collection Time: 01/02/18  5:07 AM  Result Value Ref Range   Prothrombin Time 16.1 (H) 11.4 - 15.2 seconds   INR 1.31   Brain natriuretic peptide     Status: None   Collection Time: 01/02/18  5:07 AM   Result Value Ref Range   B Natriuretic Peptide 61.5 0.0 - 100.0 pg/mL  Lipase, blood     Status: Abnormal   Collection Time: 01/02/18  7:25 AM  Result Value Ref Range   Lipase 86 (H) 11 - 51 U/L  Respiratory Panel by PCR     Status: None   Collection Time: 01/02/18 10:42 AM  Result Value Ref Range   Adenovirus NOT DETECTED NOT DETECTED   Coronavirus 229E NOT DETECTED NOT DETECTED   Coronavirus HKU1 NOT DETECTED NOT DETECTED   Coronavirus NL63 NOT DETECTED NOT DETECTED   Coronavirus OC43 NOT DETECTED NOT DETECTED   Metapneumovirus NOT DETECTED NOT DETECTED   Rhinovirus / Enterovirus NOT DETECTED NOT DETECTED   Influenza A NOT DETECTED NOT DETECTED   Influenza B NOT DETECTED NOT DETECTED   Parainfluenza Virus 1 NOT DETECTED NOT DETECTED   Parainfluenza Virus 2 NOT DETECTED NOT DETECTED   Parainfluenza Virus 3 NOT DETECTED NOT DETECTED   Parainfluenza Virus 4 NOT DETECTED NOT DETECTED   Respiratory Syncytial Virus NOT DETECTED NOT DETECTED   Bordetella pertussis NOT DETECTED NOT DETECTED   Chlamydophila pneumoniae NOT DETECTED NOT DETECTED   Mycoplasma pneumoniae NOT DETECTED NOT DETECTED  Urinalysis, Routine w reflex microscopic     Status: Abnormal   Collection Time: 01/02/18 12:41 PM  Result Value Ref Range   Color, Urine AMBER (A) YELLOW    Comment: BIOCHEMICALS MAY BE AFFECTED BY COLOR   APPearance CLEAR CLEAR   Specific Gravity, Urine 1.009 1.005 - 1.030   pH 6.0 5.0 - 8.0   Glucose, UA NEGATIVE NEGATIVE mg/dL   Hgb urine dipstick NEGATIVE NEGATIVE   Bilirubin Urine MODERATE (A) NEGATIVE   Ketones, ur NEGATIVE NEGATIVE mg/dL   Protein, ur NEGATIVE  NEGATIVE mg/dL   Nitrite NEGATIVE NEGATIVE   Leukocytes, UA NEGATIVE NEGATIVE  CBC with Differential/Platelet     Status: Abnormal   Collection Time: 01/02/18  5:40 PM  Result Value Ref Range   WBC 1.8 (L) 4.0 - 10.5 K/uL   RBC 2.80 (L) 3.87 - 5.11 MIL/uL   Hemoglobin 7.5 (L) 12.0 - 15.0 g/dL   HCT 21.6 (L) 36.0 - 46.0 %    MCV 77.1 (L) 78.0 - 100.0 fL   MCH 26.8 26.0 - 34.0 pg   MCHC 34.7 30.0 - 36.0 g/dL   RDW 20.5 (H) 11.5 - 15.5 %   Platelets 23 (LL) 150 - 400 K/uL    Comment: CRITICAL VALUE NOTED.  VALUE IS CONSISTENT WITH PREVIOUSLY REPORTED AND CALLED VALUE.   Neutrophils Relative % 82 %   Lymphocytes Relative 6 %   Monocytes Relative 9 %   Eosinophils Relative 2 %   Basophils Relative 1 %   Neutro Abs 1.5 (L) 1.7 - 7.7 K/uL   Lymphs Abs 0.1 (L) 0.7 - 4.0 K/uL   Monocytes Absolute 0.2 0.1 - 1.0 K/uL   Eosinophils Absolute 0.0 0.0 - 0.7 K/uL   Basophils Absolute 0.0 0.0 - 0.1 K/uL   RBC Morphology RARE NRBCs    WBC Morphology INCREASED BANDS (>20% BANDS)     Comment: DOHLE BODIES VACUOLATED NEUTROPHILS   CBC with Differential/Platelet     Status: Abnormal (Preliminary result)   Collection Time: 01/03/18  6:25 AM  Result Value Ref Range   WBC 1.9 (L) 4.0 - 10.5 K/uL   RBC 3.05 (L) 3.87 - 5.11 MIL/uL   Hemoglobin 7.9 (L) 12.0 - 15.0 g/dL   HCT 23.3 (L) 36.0 - 46.0 %   MCV 76.4 (L) 78.0 - 100.0 fL   MCH 25.9 (L) 26.0 - 34.0 pg   MCHC 33.9 30.0 - 36.0 g/dL   RDW 20.7 (H) 11.5 - 15.5 %   Platelets 24 (LL) 150 - 400 K/uL    Comment: RESULT REPEATED AND VERIFIED SPECIMEN CHECKED FOR CLOTS CRITICAL VALUE NOTED.  VALUE IS CONSISTENT WITH PREVIOUSLY REPORTED AND CALLED VALUE.    Neutrophils Relative % PENDING %   Neutro Abs PENDING 1.7 - 7.7 K/uL   Band Neutrophils PENDING %   Lymphocytes Relative PENDING %   Lymphs Abs PENDING 0.7 - 4.0 K/uL   Monocytes Relative PENDING %   Monocytes Absolute PENDING 0.1 - 1.0 K/uL   Eosinophils Relative PENDING %   Eosinophils Absolute PENDING 0.0 - 0.7 K/uL   Basophils Relative PENDING %   Basophils Absolute PENDING 0.0 - 0.1 K/uL   WBC Morphology PENDING    RBC Morphology PENDING    Smear Review PENDING    nRBC PENDING 0 /100 WBC   Metamyelocytes Relative PENDING %   Myelocytes PENDING %   Promyelocytes Absolute PENDING %   Blasts PENDING %   Basic metabolic panel     Status: Abnormal   Collection Time: 01/03/18  6:25 AM  Result Value Ref Range   Sodium 128 (L) 135 - 145 mmol/L   Potassium 4.2 3.5 - 5.1 mmol/L   Chloride 103 101 - 111 mmol/L   CO2 19 (L) 22 - 32 mmol/L   Glucose, Bld 89 65 - 99 mg/dL   BUN 5 (L) 6 - 20 mg/dL   Creatinine, Ser 0.37 (L) 0.44 - 1.00 mg/dL   Calcium 8.0 (L) 8.9 - 10.3 mg/dL   GFR calc non Af Amer >60 >60  mL/min   GFR calc Af Amer >60 >60 mL/min    Comment: (NOTE) The eGFR has been calculated using the CKD EPI equation. This calculation has not been validated in all clinical situations. eGFR's persistently <60 mL/min signify possible Chronic Kidney Disease.    Anion gap 6 5 - 15  Hepatic function panel     Status: Abnormal   Collection Time: 01/03/18  6:25 AM  Result Value Ref Range   Total Protein 5.1 (L) 6.5 - 8.1 g/dL   Albumin 1.8 (L) 3.5 - 5.0 g/dL   AST 213 (H) 15 - 41 U/L   ALT 35 14 - 54 U/L   Alkaline Phosphatase 688 (H) 38 - 126 U/L   Total Bilirubin 13.4 (H) 0.3 - 1.2 mg/dL   Bilirubin, Direct 8.3 (H) 0.1 - 0.5 mg/dL   Indirect Bilirubin 5.1 (H) 0.3 - 0.9 mg/dL    Studies/Results: Dg Chest Port 1 View  Result Date: 01/02/2018 CLINICAL DATA:  Fever.  Status post EGD. EXAM: PORTABLE CHEST 1 VIEW COMPARISON:  Chest x-ray dated December 31, 2017. FINDINGS: Stable mild cardiomegaly. Mild interstitial edema. Small bilateral pleural effusions with bibasilar atelectasis. No pneumothorax. No acute osseous abnormality. IMPRESSION: New mild interstitial pulmonary edema and small bilateral pleural effusions. Electronically Signed   By: Titus Dubin M.D.   On: 01/02/2018 10:09    Medications: I have reviewed the patient's current medications.  Assessment: 1. Esophageal stricture, not dilated during endoscopy due to severe thrombocytopenia and severe esophagitis. Biopsy pending for CMV.  2. Acute pancreatitis without associated complications, moderate intraperitoneal free fluid  but no organized fluid collection.  3. Abnormal liver enzymes(TB 13.4/AST213, ALT 35/ ALP 688), no biliary obstruction, post cholecystectomy. Negative results for hep A IgM hep B surface antigen and hep C antibody.  4. Pancytopenia(platelet 24, hemoglobin 7.9, WBC 1.9)  5. Hyponatremia  6. CMV DNA PCR less than 200, but positive  7. HIV-positive, poorly controlled  Plan: 1. Await biopsy results, unsure if fluconazole needs to be continued(findings on endoscopy not compatible with Candida), continue Protonix 40 mg every 12 hours, will add [name] 3 times a day. Will benefit from endoscopy with dilatation, however, cannot be done with the degree of thrombocytopenia noted, may need further platelet transfusions and for the platelet count to be above 50,000.  2. Able to tolerate full liquid diet, and will start on ringer's lactate IV at 100 mL per hour. ? Etiology unclear? Medication induced(on BIKTARVY for HIV, has side effect of pancreatitis).  3. Would try to avoid hepatotoxic medication, endoscopy findings not typical of Candida esophagitis. As total bilirubin continues to increase would recommend stopping fluconazole.  4. Pancytopenia likely related to uncontrolled HIV.   Ronnette Juniper 01/03/2018, 10:12 AM   Pager [phone] If no answer or after 5 PM call [phone]

## 2018-01-03 NOTE — Progress Notes (Signed)
.    Spoke with GI regarding patient updates.   1) eso stricture with c/o dysphagia = EGD suggests large ulceration more concerning for CMV vs. Gastritis causing ulcers. For now optimize gastric suppression with carafate and IV PPI. Anticipate will need dilatation at the end of the week  2) pancreatitis  = improving. Older regimens of ART were more likely associated with pancreatitis. Newer regimens thought to be cleaner. Would recommend to continue since she has very advanced hiv disease, and having viral suppression may also improve her overall leukopenia and thrombocytopenia  3) transaminitis = unclear if all due to azole side effects vs. Pancreatitis. Will stop with fluconazole since less evidence for eso candidiasis and can do nystatin swish and swallow for thrush  4) hiv = continue with biktarvy and dapsone for PCP proph

## 2018-01-03 NOTE — Progress Notes (Signed)
PT Cancellation Note  Patient Details Name: VOULA WALN MRN: [id number] DOB: 05/19/1976   Cancelled Treatment:    Reason Eval/Treat Not Completed: Patient declined, no reason specified. Encouraged pt to get up to chair and she stated, "tomorrow",  Will check back to attempt PT eval.  If she refuses again, we will d/c PT order at that time.   Galen Manila 01/03/2018, 2:12 PM

## 2018-01-03 NOTE — Plan of Care (Signed)
Con to mon 

## 2018-01-03 NOTE — Progress Notes (Addendum)
PROGRESS NOTE    [name]  EPP:[id number] DOB: 06/22/1976 DOA: 12/29/2017 PCP: Nolene Ebbs, MD   Brief Narrative:  [name] is [name] 42 y.o. female with medical history significant of HIV AIDS, Candida esophagitis, GERD, bipolar disorder, asthma, who comes in for 2 weeks of progressive worsening epigastric abdominal pain.  She had lab and imaging findings c/w pancreatitis.   Assessment & Plan:   Principal Problem:   Pancreatitis Active Problems:   HIV disease ([place])   Bipolar disorder ([place])   Hypokalemia   Esophageal stricture   Dysphagia   Candidal esophagitis ([facility])   Severe protein-calorie malnutrition ([facility])   Abdominal pain   Pancreatitis: Unclear etiology.  elevated bili and alk phos concerning for pancreatitis related to stone despite hx of cholecystectomy, but MRCP without biliary obstruction.  Discussed antiretroviral as possible etiology with ID, but thought less likely.  Dapsone occasionally can cause pancreatitis and cholestasis, will hold for now (discussed with ID, consider challenging again at later point).  Triglycerides 400. Lipase improving.    Advance to mech soft diet Antiemetics, analgesics IVF MRCP with findings c/w pancreatitis, but no biliary obstruction GI c/s, appreciate recs  Hepatitis: elevated LFTs as noted above (worsening bili today as well as AST and alk phos).  MRCP without obstruction.  Acute hepatitis panel negative.  CMV positive (but low quant).  Will d/c fluconazole.  Will discuss eraxis with ID.  Follow INR.   Fever:  Isolated.  Pt appears stable.   CXR with possible interstitial edema, normal BNP.   RVP negative C diff pending Ucx coag neg staph, suspect contaminant as UA clean Bcx pending Abx d/c'd.  Continue to monitor.  Low threshold to reimage with pancreatitis above.   Esophagitis: EGD with severe esophagitis and esophageal stenosis, appeared more like reflux.  IV PPI BID, biopsies pending (HSV and CMV cx pending as  well).  Needs dilation after biopsies result and platelets improved.  [name] added.   HIV  AIDS:  ID c/s, appreciate recs Continue antiretrovirals D/c dapsone as can cause cholestasis and pancreatitis (consider rechallenging at later point) (pt allergic to bactrim)  Dysphagia  Candidal Esophagitis  Esophageal Stricture  Weight loss:   Discussed with ID, rec CMV quant (positive <200) and GI c/s to consider endoscopy and CMV esophagitis given history As above Dietician  Hematemesis:  Suspect this was related to severe esophagitis as noted above.  PPI BID.       Anemia: suspect degree of dilution with IVF with pancreatitis, but with downtrending and at borderline will discuss transfusion with patient.  _0  hemoccult  Positive (no melena or hematochezia) S/p 1 unit pRBC  Thrombocytopenia: this has worsened since 2018.  Possibly related to HIV.  Will continue to monitor for now.  S/p 1 unit platelets 1/18 prior to EGD  Bipolar disorder: continue home meds as able  Hypokalemia: f/u mg, replete prn.  K in IVF.   Severe Protein Calorie malnutrition: dietician  NAGMA: continue to monitor, resume, continue to monitor  Hyponatremia: maybe hypovolemic? Follow with restarting IVF.   _1  urine sodium, osms, serum osms  DVT prophylaxis: SCD Code Status: full code Family Communication: none at bedside Disposition Plan: pending  Consultants:   ID and GI  Procedures:  EGD 1/18 Impression: Esophageal stenosis. Severe esophagitis. Biopsied. Moderate Sedation: - Clear liquid diet. - Continue present medications. - Use Protonix (pantoprazole) 40 mg IV daily BID. Intensify PPI therapy for [name] few days, and elevate head of  bed. Check biopsies. Will need repeat EGD with dilation after biopsies are checked and inflammation calms down and we try and make sure platlets are above 50,000.  Antimicrobials:  Anti-infectives (From admission, onward)   Start     Dose/Rate Route Frequency  Ordered Stop   01/02/18 2200  vancomycin (VANCOCIN) 500 mg in sodium chloride 0.9 % 100 mL IVPB  Status:  Discontinued     500 mg 100 mL/hr over 60 Minutes Intravenous Every 12 hours 01/02/18 1111 01/02/18 1559   01/02/18 1400  ceFEPIme (MAXIPIME) 1 g in dextrose 5 % 50 mL IVPB  Status:  Discontinued     1 g 100 mL/hr over 30 Minutes Intravenous Every 8 hours 01/02/18 1111 01/02/18 1559   01/02/18 1200  vancomycin (VANCOCIN) IVPB 1000 mg/200 mL premix     1,000 mg 200 mL/hr over 60 Minutes Intravenous  Once 01/02/18 1111 01/02/18 1344   12/31/17 2000  dapsone tablet 100 mg     100 mg Oral Daily 12/31/17 1859     12/31/17 1000  fluconazole (DIFLUCAN) IVPB 100 mg  Status:  Discontinued     100 mg 50 mL/hr over 60 Minutes Intravenous Every 24 hours 12/30/17 1001 12/30/17 1431   12/31/17 1000  fluconazole (DIFLUCAN) IVPB 400 mg  Status:  Discontinued     400 mg 100 mL/hr over 120 Minutes Intravenous Every 24 hours 12/30/17 1431 01/03/18 1111   12/30/17 1100  fluconazole (DIFLUCAN) IVPB 200 mg     200 mg 100 mL/hr over 60 Minutes Intravenous  Once 12/30/17 1001 12/30/17 1220   12/30/17 1000  bictegravir-emtricitabine-tenofovir AF (BIKTARVY) 50-200-25 MG per tablet 1 tablet     1 tablet Oral Daily 12/30/17 0933        Subjective: Feels about the same Tolerating PO ok  Objective: Vitals:   01/02/18 0925 01/02/18 0939 01/02/18 2130 01/03/18 0655  BP:  121/82 115/76 123/76  Pulse: 97 93 95 (!) 104  Resp: (!) _0 Temp:  97.9 F (36.6 C) 98.9 F (37.2 C) 99.4 F (37.4 C)  TempSrc:  Oral Oral Oral  SpO2: 97% 98% 96% 97%  Weight:    47.8 kg (105 lb 6.1 oz)  Height:        Intake/Output Summary (Last 24 hours) at 01/03/2018 1118 Last data filed at 01/02/2018 1500 Gross per 24 hour  Intake 1490 ml  Output 500 ml  Net 990 ml   Filed Weights   12/31/17 0538 01/01/18 0603 01/03/18 0655  Weight: 46.6 kg (102 lb 11.8 oz) 47.8 kg (105 lb 6.1 oz) 47.8 kg (105 lb 6.1 oz)     Examination:  General: No acute distress. Cardiovascular: Heart sounds show [name] regular rate, and rhythm. No gallops or rubs. No murmurs. No JVD. Lungs: Clear to auscultation bilaterally with good air movement. No rales, rhonchi or wheezes. Abdomen: Soft, diffusely tender across upper quadrants (seems table), no rebound/guarding,  No masses. No hepatosplenomegaly. Neurological: Alert and oriented 3.  Cranial nerves II through XII grossly intact. Skin: Warm and dry. No rashes or lesions. Extremities: No clubbing or cyanosis. No edema.  Psychiatric: Mood and affect are normal. Insight and judgment are appropriate.   Data Reviewed: I have personally reviewed following labs and imaging studies  CBC: Recent Labs  Lab 12/31/17 0727 12/31/17 1418 01/01/18 0534 01/02/18 0507 01/02/18 1740 01/03/18 0625  WBC 2.1*  --  2.0* 1.7* 1.8* 1.9*  NEUTROABS 1.8  --   --   --  1.5* PENDING  HGB 7.4* 7.0* 7.9* 7.8* 7.5* 7.9*  HCT 23.1* 21.3* 23.5* 22.3* 21.6* 23.3*  MCV 78.6  --  78.9 76.6* 77.1* 76.4*  PLT 30*  --  25* 23* 23* 24*   Basic Metabolic Panel: Recent Labs  Lab 12/30/17 0700 12/30/17 1003 12/31/17 0727 01/01/18 0534 01/02/18 0507 01/03/18 0625  NA  --  142 142 141 132* 128*  K  --  2.9* 2.8* 3.7 4.3 4.2  CL  --  110 115* 117* 109 103  CO2  --  24 21* 19* 18* 19*  GLUCOSE  --  117* 95 86 83 89  BUN  --  _0 <5* 5*  CREATININE  --  0.54 0.61 0.46 0.42* 0.37*  CALCIUM  --  8.0* 8.1* 8.4* 8.2* 8.0*  MG 2.0  --   --  1.8  --   --    GFR: Estimated Creatinine Clearance: 70.5 mL/min ([name]) (by C-G formula based on SCr of 0.37 mg/dL (L)). Liver Function Tests: Recent Labs  Lab 12/29/17 1940 12/31/17 0727 01/01/18 0534 01/02/18 0507 01/03/18 0625  AST 161* 106* 99* 149* 213*  ALT 37 _1 35  ALKPHOS 944* 744* 672* 672* 688*  BILITOT 14.7* 11.6* 11.5* 11.1* 13.4*  PROT 7.1 5.7* 5.3* 4.9* 5.1*  ALBUMIN 2.6* 2.1* 1.9* 1.7* 1.8*   Recent Labs  Lab  12/29/17 1940 12/31/17 0727 01/01/18 0534 01/02/18 0725  LIPASE 1,305* 809* 208* 86*   No results for input(s): AMMONIA in the last 168 hours. Coagulation Profile: Recent Labs  Lab 01/01/18 1522 01/02/18 0507  INR 1.29 1.31   Cardiac Enzymes: No results for input(s): CKTOTAL, CKMB, CKMBINDEX, TROPONINI in the last 168 hours. BNP (last 3 results) No results for input(s): PROBNP in the last 8760 hours. HbA1C: No results for input(s): HGBA1C in the last 72 hours. CBG: No results for input(s): GLUCAP in the last 168 hours. Lipid Profile: No results for input(s): CHOL, HDL, LDLCALC, TRIG, CHOLHDL, LDLDIRECT in the last 72 hours. Thyroid Function Tests: No results for input(s): TSH, T4TOTAL, FREET4, T3FREE, THYROIDAB in the last 72 hours. Anemia Panel: No results for input(s): VITAMINB12, FOLATE, FERRITIN, TIBC, IRON, RETICCTPCT in the last 72 hours. Sepsis Labs: No results for input(s): PROCALCITON, LATICACIDVEN in the last 168 hours.  Recent Results (from the past 240 hour(s))  Respiratory Panel by PCR     Status: None   Collection Time: 01/02/18 10:42 AM  Result Value Ref Range Status   Adenovirus NOT DETECTED NOT DETECTED Final   Coronavirus 229E NOT DETECTED NOT DETECTED Final   Coronavirus HKU1 NOT DETECTED NOT DETECTED Final   Coronavirus NL63 NOT DETECTED NOT DETECTED Final   Coronavirus OC43 NOT DETECTED NOT DETECTED Final   Metapneumovirus NOT DETECTED NOT DETECTED Final   Rhinovirus / Enterovirus NOT DETECTED NOT DETECTED Final   Influenza Maple Odaniel NOT DETECTED NOT DETECTED Final   Influenza B NOT DETECTED NOT DETECTED Final   Parainfluenza Virus 1 NOT DETECTED NOT DETECTED Final   Parainfluenza Virus 2 NOT DETECTED NOT DETECTED Final   Parainfluenza Virus 3 NOT DETECTED NOT DETECTED Final   Parainfluenza Virus 4 NOT DETECTED NOT DETECTED Final   Respiratory Syncytial Virus NOT DETECTED NOT DETECTED Final   Bordetella pertussis NOT DETECTED NOT DETECTED Final    Chlamydophila pneumoniae NOT DETECTED NOT DETECTED Final   Mycoplasma pneumoniae NOT DETECTED NOT DETECTED Final  Culture, Urine     Status: None (Preliminary result)  Collection Time: 01/02/18 12:41 PM  Result Value Ref Range Status   Specimen Description URINE, CLEAN CATCH  Final   Special Requests NONE  Final   Culture   Final    CULTURE REINCUBATED FOR BETTER GROWTH Performed at [facility], [place] [address], [place], [place] [address]    Report Status PENDING  Incomplete         Radiology Studies: Dg Chest Port 1 View  Result Date: 01/02/2018 CLINICAL DATA:  Fever.  Status post EGD. EXAM: PORTABLE CHEST 1 VIEW COMPARISON:  Chest x-ray dated December 31, 2017. FINDINGS: Stable mild cardiomegaly. Mild interstitial edema. Small bilateral pleural effusions with bibasilar atelectasis. No pneumothorax. No acute osseous abnormality. IMPRESSION: New mild interstitial pulmonary edema and small bilateral pleural effusions. Electronically Signed   By: Titus Dubin M.D.   On: 01/02/2018 10:09        Scheduled Meds: . benztropine  1 mg Oral Daily  . bictegravir-emtricitabine-tenofovir AF  1 tablet Oral Daily  . dapsone  100 mg Oral Daily  . feeding supplement  1 Container Oral TID BM  . multivitamin with minerals  1 tablet Oral Daily  . OLANZapine  5 mg Oral QHS  . pantoprazole (PROTONIX) IV  40 mg Intravenous Q12H  . sucralfate  1 g Oral TID WC & HS   Continuous Infusions: . lactated ringers 100 mL/hr at 01/03/18 1100     LOS: 4 days    Time spent: over 30 min    Fayrene Helper, MD [facility] Pager [phone]  If 7PM-7AM, please contact night-coverage [email or web address] Password [email or web address] 01/03/2018, 11:18 AM

## 2018-01-04 DIAGNOSIS — B1910 Unspecified viral hepatitis B without hepatic coma: Secondary | ICD-10-CM

## 2018-01-04 DIAGNOSIS — D61818 Other pancytopenia: Secondary | ICD-10-CM

## 2018-01-04 DIAGNOSIS — K920 Hematemesis: Secondary | ICD-10-CM

## 2018-01-04 DIAGNOSIS — K209 Esophagitis, unspecified: Secondary | ICD-10-CM

## 2018-01-04 DIAGNOSIS — E079 Disorder of thyroid, unspecified: Secondary | ICD-10-CM

## 2018-01-04 DIAGNOSIS — D573 Sickle-cell trait: Secondary | ICD-10-CM

## 2018-01-04 DIAGNOSIS — J45909 Unspecified asthma, uncomplicated: Secondary | ICD-10-CM

## 2018-01-04 DIAGNOSIS — R634 Abnormal weight loss: Secondary | ICD-10-CM

## 2018-01-04 LAB — BASIC METABOLIC PANEL
ANION GAP: 4 — AB (ref 5–15)
BUN: 8 mg/dL (ref 6–20)
CALCIUM: 8 mg/dL — AB (ref 8.9–10.3)
CO2: 23 mmol/L (ref 22–32)
Chloride: 105 mmol/L (ref 101–111)
Creatinine, Ser: 0.3 mg/dL — ABNORMAL LOW (ref 0.44–1.00)
Glucose, Bld: 80 mg/dL (ref 65–99)
Potassium: 3.7 mmol/L (ref 3.5–5.1)
Sodium: 132 mmol/L — ABNORMAL LOW (ref 135–145)

## 2018-01-04 LAB — CBC
HCT: 19.9 % — ABNORMAL LOW (ref 36.0–46.0)
Hemoglobin: 6.9 g/dL — CL (ref 12.0–15.0)
MCH: 26.7 pg (ref 26.0–34.0)
MCHC: 34.7 g/dL (ref 30.0–36.0)
MCV: 77.1 fL — AB (ref 78.0–100.0)
PLATELETS: 24 10*3/uL — AB (ref 150–400)
RBC: 2.58 MIL/uL — ABNORMAL LOW (ref 3.87–5.11)
RDW: 21.4 % — AB (ref 11.5–15.5)
WBC: 2.3 10*3/uL — AB (ref 4.0–10.5)

## 2018-01-04 LAB — FIBRINOGEN: Fibrinogen: 267 mg/dL (ref 210–475)

## 2018-01-04 LAB — HEPATIC FUNCTION PANEL
ALT: 37 U/L (ref 14–54)
AST: 236 U/L — AB (ref 15–41)
Albumin: 1.5 g/dL — ABNORMAL LOW (ref 3.5–5.0)
Alkaline Phosphatase: 570 U/L — ABNORMAL HIGH (ref 38–126)
BILIRUBIN DIRECT: 9.2 mg/dL — AB (ref 0.1–0.5)
Indirect Bilirubin: 5.6 mg/dL — ABNORMAL HIGH (ref 0.3–0.9)
Total Bilirubin: 14.8 mg/dL — ABNORMAL HIGH (ref 0.3–1.2)
Total Protein: 4.6 g/dL — ABNORMAL LOW (ref 6.5–8.1)

## 2018-01-04 LAB — HEMOGLOBIN AND HEMATOCRIT, BLOOD
HCT: 27.7 % — ABNORMAL LOW (ref 36.0–46.0)
HEMATOCRIT: 19.1 % — AB (ref 36.0–46.0)
HEMOGLOBIN: 6.6 g/dL — AB (ref 12.0–15.0)
HEMOGLOBIN: 9.5 g/dL — AB (ref 12.0–15.0)

## 2018-01-04 LAB — TSH: TSH: 1.77 u[IU]/mL (ref 0.350–4.500)

## 2018-01-04 LAB — PROTIME-INR
INR: 1.38
Prothrombin Time: 16.8 seconds — ABNORMAL HIGH (ref 11.4–15.2)

## 2018-01-04 LAB — SAVE SMEAR

## 2018-01-04 LAB — URINE CULTURE: Culture: 40000 — AB

## 2018-01-04 LAB — PREPARE RBC (CROSSMATCH)

## 2018-01-04 LAB — APTT: APTT: 44 s — AB (ref 24–36)

## 2018-01-04 LAB — LACTATE DEHYDROGENASE: LDH: 271 U/L — AB (ref 98–192)

## 2018-01-04 LAB — OSMOLALITY: OSMOLALITY: 273 mosm/kg — AB (ref 275–295)

## 2018-01-04 MED ORDER — SODIUM CHLORIDE 0.9 % IV SOLN
Freq: Once | INTRAVENOUS | Status: AC
  Administered 2018-01-04: 15:00:00 via INTRAVENOUS

## 2018-01-04 MED ORDER — OXYCODONE HCL 5 MG PO TABS: 5.0000 mg | ORAL_TABLET | ORAL | Status: DC | PRN

## 2018-01-04 MED ORDER — FOLIC ACID 1 MG PO TABS
2.0000 mg | ORAL_TABLET | Freq: Every day | ORAL | Status: DC
  Administered 2018-01-10 – 2018-01-20 (×9): 2 mg via ORAL
  Filled 2018-01-04 (×14): qty 2

## 2018-01-04 NOTE — Consult Note (Signed)
Marland Kitchen    HEMATOLOGY/ONCOLOGY CONSULTATION NOTE  Date of Service: 01/04/2018  Patient Care Team: Nolene Ebbs, MD as PCP - General (Internal Medicine) Michel Bickers, MD as PCP - Infectious Diseases (Infectious Diseases)  CHIEF COMPLAINTS/PURPOSE OF CONSULTATION:    Pancytopenia  HISTORY OF PRESENTING ILLNESS:   [name] is a  42 y.o. female who has been referred to [place] by Dr Florene Glen, Kendra Opitz., *  for evaluation and management of pancytopenia.  Patient has a history of HIV/AIDS(non compliant with anti-retrovirals), sickle cell trait,significant psychiatric history with bipolar disorder and previous hospitalizations with acute psychosis, asthma, hepatitis B, previous Pneumocystis carinii pneumonia, thyroid disease presented with significant epigastric abdominal pain.  It appears that the patient has been  having significant weight loss and is down from 170 pounds down to 95 pounds over the last year.  There was concern that she had candidal esophagitis but was not able to take her Diflucan pills.  Has been having significant dysphagia related to this which is limited how she is eating. She was noted to have pancreatitis with a lipase level more than 1000 and was admitted for acute pancreatitis.  Unclear etiology for this.  ID does not feel her anti-retrovirals ore a cause.  Thought to be possibly related to dapsone which has been held. Also noted to have elevated alkaline phosphatase and bilirubin levels with an MRCP showing no evidence of biliary obstruction. Did have some fevers and is undergoing a septic workup. She was noted to have hematemesis be related to severe esophagitis as well as with thrombocytopenia. Also has been noted to have thrombocytopenia which has worsened somewhat since 2018.  She was seen for anemia leukopenia and thrombocytopenia by Dr. Jimmye Norman MD in 09/2017 at that time she had a WBC count of [phone] with a hemoglobin of 8.3-8.7 and platelet count of  155k. She was noted to be noncompliant with her HIV medications and her HIV was not under control.  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  And apparently had some response with the increase in white counts at the time. Bone marrow biopsy showed mildly hypocellular bone marrow with some granulomata.  Negative for AFB and fungus. Cytogenetics normal On discharge during last hospitalization on 10/22/2017 patient had a platelet count of 99k with a hemoglobin of 9.5 and MCV of 75 with a WBC count of 5k.  Patient on admission on 12/29/2017 this time had a hemoglobin of 9.6 with an MCV of 75 with a platelet count of 38k and a WBC count of 3.3k.  CMV low positive titers Stool occult blood positive Septic w/u in progress.  MEDICAL HISTORY:  Past Medical History:  Diagnosis Date  . Acute psychosis ([place]) 03/10/12   2nd admission in last wk for this  . Angio-edema   . Anxiety   . Asthma    inhaler 2xday  . Bipolar disorder ([place])   . Depression   . GERD (gastroesophageal reflux disease)   . Hepatitis B    /E-chart  . History of blood transfusion   . HIV positive ([place])   . Microcytic anemia    h/o per E-chart  . Noncompliance with medication regimen    /e-chart  . 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 Pyelonephritis    h/o per E-chart  . Shortness of breath dyspnea    due to Asthma  . Thyroid disease   . Urticaria  SURGICAL HISTORY: Past Surgical History:  Procedure Laterality Date  . BALLOON DILATION N/A 02/14/2015   Procedure: BALLOON DILATION;  Surgeon: Lear Ng, MD;  Location: [facility] ENDOSCOPY;  Service: Endoscopy;  Laterality: N/A;  . BALLOON DILATION N/A 10/25/2016   Procedure: BALLOON DILATION;  Surgeon: Wilford Corner, MD;  Location: [facility] ENDOSCOPY;  Service: Endoscopy;  Laterality: N/A;  . BALLOON DILATION N/A 01/14/2017   Procedure: BALLOON DILATION;  Surgeon: Wilford Corner, MD;  Location: [facility] ENDOSCOPY;  Service: Endoscopy;  Laterality: N/A;  . BALLOON DILATION N/A 04/30/2017   Procedure: BALLOON DILATION;  Surgeon: Wilford Corner, MD;  Location: [facility] ENDOSCOPY;  Service: Endoscopy;  Laterality: N/A;  . CHOLECYSTECTOMY    . CHOLECYSTECTOMY N/A 10/15/2017   Procedure: LAPAROSCOPIC CHOLECYSTECTOMY WITH INTRAOPERATIVE CHOLANGIOGRAM;  Surgeon: Coralie Keens, MD;  Location: [place];  Service: General;  Laterality: N/A;  . ESOPHAGOGASTRODUODENOSCOPY N/A 07/13/2013   Procedure: ESOPHAGOGASTRODUODENOSCOPY (EGD);  Surgeon: Beryle Beams, MD;  Location: [facility] ENDOSCOPY;  Service: Endoscopy;  Laterality: N/A;  . ESOPHAGOGASTRODUODENOSCOPY N/A 08/12/2014   Procedure: ESOPHAGOGASTRODUODENOSCOPY (EGD);  Surgeon: Milus Banister, MD;  Location: [facility] ENDOSCOPY;  Service: Endoscopy;  Laterality: N/A;  . ESOPHAGOGASTRODUODENOSCOPY N/A 02/14/2015   Procedure: ESOPHAGOGASTRODUODENOSCOPY (EGD);  Surgeon: Lear Ng, MD;  Location: [facility] ENDOSCOPY;  Service: Endoscopy;  Laterality: N/A;  . ESOPHAGOGASTRODUODENOSCOPY N/A 10/25/2016   Procedure: ESOPHAGOGASTRODUODENOSCOPY (EGD);  Surgeon: Wilford Corner, MD;  Location: [facility] ENDOSCOPY;  Service: Endoscopy;  Laterality: N/A;  . ESOPHAGOGASTRODUODENOSCOPY (EGD) WITH PROPOFOL N/A 04/11/2015   Procedure: ESOPHAGOGASTRODUODENOSCOPY (EGD) WITH PROPOFOL;  Surgeon: Wilford Corner, MD;  Location: [facility] ENDOSCOPY;  Service: Endoscopy;  Laterality: N/A;  . ESOPHAGOGASTRODUODENOSCOPY (EGD) WITH PROPOFOL N/A 09/11/2015   Procedure: ESOPHAGOGASTRODUODENOSCOPY (EGD) WITH PROPOFOL;  Surgeon: Wilford Corner, MD;  Location: [facility] ENDOSCOPY;  Service: Endoscopy;  Laterality: N/A;  . ESOPHAGOGASTRODUODENOSCOPY (EGD) WITH PROPOFOL N/A 01/14/2017   Procedure: ESOPHAGOGASTRODUODENOSCOPY (EGD) WITH PROPOFOL;  Surgeon: Wilford Corner, MD;  Location: [facility] ENDOSCOPY;  Service: Endoscopy;  Laterality: N/A;  . ESOPHAGOGASTRODUODENOSCOPY (EGD) WITH PROPOFOL N/A  04/30/2017   Procedure: ESOPHAGOGASTRODUODENOSCOPY (EGD) WITH PROPOFOL;  Surgeon: Wilford Corner, MD;  Location: [place];  Service: Endoscopy;  Laterality: N/A;  . SAVORY DILATION N/A 02/14/2015   Procedure: SAVORY DILATION;  Surgeon: Lear Ng, MD;  Location: [place];  Service: Endoscopy;  Laterality: N/A;  no xray needed  . SAVORY DILATION N/A 04/11/2015   Procedure: SAVORY DILATION;  Surgeon: Wilford Corner, MD;  Location: [facility] ENDOSCOPY;  Service: Endoscopy;  Laterality: N/A;  . SAVORY DILATION N/A 09/11/2015   Procedure: SAVORY DILATION;  Surgeon: Wilford Corner, MD;  Location: [facility] ENDOSCOPY;  Service: Endoscopy;  Laterality: N/A;  . SAVORY DILATION N/A 04/30/2017   Procedure: SAVORY DILATION;  Surgeon: Wilford Corner, MD;  Location: [place];  Service: Endoscopy;  Laterality: N/A;  . THYROID LOBECTOMY  08/2002   left & isthmectomy; for benign thyroid adenoma/E-chart  . THYROID SURGERY    . TUBAL LIGATION  07/2002   /E-chart    SOCIAL HISTORY: Social History   Socioeconomic History  . Marital status: Single    Spouse name: Not on file  . Number of children: Not on file  . Years of education: Not on file  . Highest education level: Not on file  Social Needs  . Financial resource strain: Not on file  . Food insecurity - worry: Not on file  . Food insecurity - inability: Not on file  . Transportation needs - medical: Not on file  . Transportation  needs - non-medical: Not on file  Occupational History  . Not on file  Tobacco Use  . Smoking status: Never Smoker  . Smokeless tobacco: Never Used  Substance and Sexual Activity  . Alcohol use: No    Alcohol/week: 1.2 oz    Types: 2 Glasses of wine per week  . Drug use: No  . Sexual activity: No    Comment: declined condoms  Other Topics Concern  . Not on file  Social History Narrative  . Not on file    FAMILY HISTORY: Family History  Problem Relation Age of Onset  . Cancer Mother   . Diabetes  Mother   . Diabetes Father   . Heart disease Father   . Diabetes Sister   . Urticaria Sister   . Asthma Son   . Allergic rhinitis Neg Hx   . Eczema Neg Hx   . Immunodeficiency Neg Hx     ALLERGIES:  is allergic to other; peanut-containing drug products; shellfish allergy; bactrim; orange fruit; and sulfa antibiotics.  MEDICATIONS:  Current Facility-Administered Medications  Medication Dose Route Frequency Provider Last Rate Last Dose  . 0.9 %  sodium chloride infusion   Intravenous Once Elodia Florence., MD      . albuterol (PROVENTIL) (2.5 MG/3ML) 0.083% nebulizer solution 2.5 mg  2.5 mg Nebulization Q4H PRN Phillips Grout, MD      . benztropine (COGENTIN) tablet 1 mg  1 mg Oral Daily Derrill Kay A, MD   1 mg at 01/03/18 1736  . bictegravir-emtricitabine-tenofovir AF (BIKTARVY) 50-200-25 MG per tablet 1 tablet  1 tablet Oral Daily Elodia Florence., MD   1 tablet at 01/04/18 1035  . feeding supplement (BOOST / RESOURCE BREEZE) liquid 1 Container  1 Container Oral TID BM Elodia Florence., MD   1 Container at 01/04/18 1131  . lactated ringers infusion   Intravenous Continuous Ronnette Juniper, MD 75 mL/hr at 01/04/18 1033    . morphine 4 MG/ML injection 2 mg  2 mg Intravenous Q3H PRN Phillips Grout, MD   2 mg at 01/04/18 0314  . multivitamin with minerals tablet 1 tablet  1 tablet Oral Daily Elodia Florence., MD   1 tablet at 01/02/18 1024  . nystatin (MYCOSTATIN) 100000 UNIT/ML suspension 500,000 Units  5 mL Oral QID Carlyle Basques, MD   500,000 Units at 01/04/18 1035  . ondansetron (ZOFRAN) tablet 4 mg  4 mg Oral Q6H PRN Phillips Grout, MD       Or  . ondansetron [facility]) injection 4 mg  4 mg Intravenous Q6H PRN Phillips Grout, MD   4 mg at 12/31/17 0929  . oxyCODONE (Oxy IR/ROXICODONE) immediate release tablet 5-10 mg  5-10 mg Oral Q4H PRN Elodia Florence., MD      . pantoprazole (PROTONIX) injection 40 mg  40 mg Intravenous Q12H Wonda Horner, MD   40  mg at 01/04/18 1132  . promethazine (PHENERGAN) injection 6.25 mg  6.25 mg Intravenous Q8H PRN Elodia Florence., MD      . sucralfate ([name]) 1 GM/10ML suspension 1 g  1 g Oral TID WC & HS Ronnette Juniper, MD   1 g at 01/03/18 2144    REVIEW OF SYSTEMS:    10 Point review of Systems was done is negative except as noted above.  PHYSICAL EXAMINATION: ECOG PERFORMANCE STATUS: 3 - Symptomatic, >50% confined to bed  . Vitals:   01/03/18  2144 01/04/18 0509  BP: 116/70 101/62  Pulse: 99 98  Resp: 16 16  Temp: 99.2 F (37.3 C) 98.5 F (36.9 C)  SpO2: 100% 97%   Filed Weights   01/01/18 0603 01/03/18 0655 01/04/18 0509  Weight: 105 lb 6.1 oz (47.8 kg) 105 lb 6.1 oz (47.8 kg) 113 lb 1.5 oz (51.3 kg)   .Body mass index is 14.92 kg/m.  GENERAL:cachetic appearing female with overt dysphagia SKIN:  No overt rashes noted EYES: conjunctiva are pink and non-injected, sclera anicteric OROPHARYNX: MMM, thrush + NECK: supple, no JVD LYMPH:  Minimal small palpable lymphadenopathy in the cervical, axillary  LUNGS: clear to auscultation b/l with normal respiratory effort HEART: regular rate & rhythm ABDOMEN:  TTP epigastric region. No guarding rigidity rebound. Extremity: no pedal edema PSYCH: alert & oriented x 3 blunt affect NEURO: no focal motor/sensory deficits  LABORATORY DATA:  I have reviewed the data as listed  . CBC Latest Ref Rng & Units 01/04/2018 01/04/2018 01/03/2018  WBC 4.0 - 10.5 K/uL - 2.3(L) 1.9(L)  Hemoglobin 12.0 - 15.0 g/dL 6.6(LL) 6.9(LL) 7.9(L)  Hematocrit 36.0 - 46.0 % 19.1(L) 19.9(L) 23.3(L)  Platelets 150 - 400 K/uL - 24(LL) 24(LL)   . CBC    Component Value Date/Time   WBC 2.3 (L) 01/04/2018 0534   RBC 2.58 (L) 01/04/2018 0534   HGB 6.6 (LL) 01/04/2018 0832   HCT 19.1 (L) 01/04/2018 0832   PLT 24 (LL) 01/04/2018 0534   MCV 77.1 (L) 01/04/2018 0534   MCH 26.7 01/04/2018 0534   MCHC 34.7 01/04/2018 0534   RDW 21.4 (H) 01/04/2018 0534   LYMPHSABS  0.1 (L) 01/03/2018 0625   MONOABS 0.3 01/03/2018 0625   EOSABS 0.0 01/03/2018 0625   BASOSABS 0.0 01/03/2018 0625   . CMP Latest Ref Rng & Units 01/04/2018 01/03/2018 01/02/2018  Glucose 65 - 99 mg/dL 80 89 83  BUN 6 - 20 mg/dL 8 5(L) <5(L)  Creatinine 0.44 - 1.00 mg/dL 0.30(L) 0.37(L) 0.42(L)  Sodium 135 - 145 mmol/L 132(L) 128(L) 132(L)  Potassium 3.5 - 5.1 mmol/L 3.7 4.2 4.3  Chloride 101 - 111 mmol/L 105 103 109  CO2 22 - 32 mmol/L 23 19(L) 18(L)  Calcium 8.9 - 10.3 mg/dL 8.0(L) 8.0(L) 8.2(L)  Total Protein 6.5 - 8.1 g/dL 4.6(L) 5.1(L) 4.9(L)  Total Bilirubin 0.3 - 1.2 mg/dL 14.8(H) 13.4(H) 11.1(H)  Alkaline Phos 38 - 126 U/L 570(H) 688(H) 672(H)  AST 15 - 41 U/L 236(H) 213(H) 149(H)  ALT 14 - 54 U/L 37 35 24       RADIOGRAPHIC STUDIES: I have personally reviewed the radiological images as listed and agreed with the findings in the report. Dg Chest 2 View  Result Date: 12/31/2017 CLINICAL DATA:  Nipple marker request EXAM: CHEST  2 VIEW COMPARISON:  1100 hours FINDINGS: Left basilar nodular density corresponds to the nipple marker. Allowing for nipple shadows, lungs are clear other than minimal subsegmental atelectasis for scarring at the left base. Lungs are clear. No pneumothorax or pleural effusion. Mild cardiomegaly. Normal vascularity. IMPRESSION: No active cardiopulmonary disease. Electronically Signed   By: Marybelle Killings M.D.   On: 12/31/2017 19:13   Ct Abdomen Pelvis W Contrast  Result Date: 12/30/2017 CLINICAL DATA:  Generalized abdominal pain, vomiting and hematuria. HIV positive. Elevated lipase. Prior cholecystectomy. EXAM: CT ABDOMEN AND PELVIS WITH CONTRAST TECHNIQUE: Multidetector CT imaging of the abdomen and pelvis was performed using the standard protocol following bolus administration of intravenous contrast. CONTRAST:  80  cc Isovue-300 IV. COMPARISON:  10/21/2017 CT abdomen/pelvis. FINDINGS: Lower chest: No significant pulmonary nodules or acute consolidative  airspace disease. Hepatobiliary: Normal liver size. No liver mass. Cholecystectomy. No biliary ductal dilatation. Common bile duct diameter 5 mm. Pancreas: There is acute diffuse pancreatic enlargement with prominent pancreatic and peripancreatic edema with ill-defined peripancreatic fluid extending into the bilateral anterior paranephric retroperitoneal spaces and into the lesser sac, compatible with acute pancreatitis. No regions of pancreatic parenchymal nonenhancement or gas. No pancreatic mass or duct dilation. No measurable peripancreatic fluid collections. Spleen: Normal size. No mass. Adrenals/Urinary Tract: Normal adrenals. Normal kidneys with no hydronephrosis and no renal mass. Normal bladder. Stomach/Bowel: Normal non-distended stomach. Normal caliber small bowel with no small bowel wall thickening. Appendix. Normal large bowel with no diverticulosis, large bowel wall thickening or pericolonic fat stranding. Vascular/Lymphatic: Normal caliber abdominal aorta. Patent portal, splenic, hepatic and renal veins. Stable mildly enlarged 1.0 cm right retrocrural node (series 2/image 15). Stable mildly enlarged 1.2 cm porta hepatis node (series 2/image 22). Stable mild left para-aortic adenopathy measuring up to 1.4 cm (series 2/image 31). No new pathologically enlarged abdominopelvic nodes. Reproductive: Grossly normal uterus.  No adnexal mass. Other: Small volume ascites. No pneumoperitoneum. No focal fluid collection. Musculoskeletal: No aggressive appearing focal osseous lesions. IMPRESSION: 1. Acute pancreatitis diffusely involving the pancreas, without complication. 2. Small volume ascites. 3. Chronic porta hepatis and retroperitoneal adenopathy is stable and probably reactive. Electronically Signed   By: Ilona Sorrel M.D.   On: 12/30/2017 02:45   Mr 3d Recon At Scanner  Result Date: 12/31/2017 CLINICAL DATA:  Abnormal liver function tests. Elevated lipase. HIV. EXAM: MRI ABDOMEN WITHOUT AND WITH  CONTRAST (INCLUDING MRCP) TECHNIQUE: Multiplanar multisequence MR imaging of the abdomen was performed both before and after the administration of intravenous contrast. Heavily T2-weighted images of the biliary and pancreatic ducts were obtained, and three-dimensional MRCP images were rendered by post processing. CONTRAST:  55m MULTIHANCE GADOBENATE DIMEGLUMINE 529 MG/ML IV SOLN COMPARISON:  CT 12/30/2017 FINDINGS: Lower chest:  Lung bases are clear. Hepatobiliary: No intrahepatic duct dilatation. No focal hepatic lesion. No enhancing hepatic lesion. Postcholecystectomy. Common bile duct normal caliber. Pancreas: Mild edema associated with the pancreas. Pancreatic duct is normal caliber. No organized fluid collections. Small amount fluid along the LEFT gutter. Contrast enhanced imaging demonstrates uniform enhancement of the pancreas Spleen: Normal spleen. Adrenals/urinary tract: Adrenal glands and kidneys are normal. Stomach/Bowel: Stomach and limited of the small bowel is unremarkable Vascular/Lymphatic: There are enlarged lymph nodes the retroperitoneum along the aorta the kidneys. For example 12 mm and 8 mm short axis lymph nodes LEFT aorta on image 67, series 12/19/2001 Musculoskeletal: No aggressive osseous lesion IMPRESSION: 1. Mild pancreatic edema associated with pancreatitis. 2. Moderate intraperitoneal free fluid. No organized fluid collections. 3. No biliary obstruction.  Postcholecystectomy. 4. Mild retroperitoneal lymphadenopathy favor HIV adenopathy. Electronically Signed   By: SSuzy BouchardM.D.   On: 12/31/2017 17:36   Dg Chest Port 1 View  Result Date: 01/02/2018 CLINICAL DATA:  Fever.  Status post EGD. EXAM: PORTABLE CHEST 1 VIEW COMPARISON:  Chest x-ray dated December 31, 2017. FINDINGS: Stable mild cardiomegaly. Mild interstitial edema. Small bilateral pleural effusions with bibasilar atelectasis. No pneumothorax. No acute osseous abnormality. IMPRESSION: New mild interstitial pulmonary  edema and small bilateral pleural effusions. Electronically Signed   By: WTitus DubinM.D.   On: 01/02/2018 10:09   Dg Chest Port 1 View  Result Date: 12/31/2017 CLINICAL DATA:  Shortness of breath, chest congestion,  and productive cough. History of HIV, nonsmoker. EXAM: PORTABLE CHEST 1 VIEW COMPARISON:  Chest x-ray and chest CT scan of October 21, 2017 FINDINGS: The lungs are well-expanded. There is no focal infiltrate. There is a rounded approximately 1.5 cm diameter structure projecting over the posterolateral aspect of the left ninth rib which may reflect a nipple shadow. No similar finding is noted on the right however. Some asymmetry of the nipples was noted on the CT scan of October 21, 2017. The apices appear clear. The heart and pulmonary vascularity are normal. The mediastinum is normal in width. No mediastinal or hilar lymphadenopathy is observed. There is no pleural effusion. The bony thorax is unremarkable. IMPRESSION: No acute infiltrate or definite mass. Presumed prominent nipple shadow on the left. A repeat chest x-ray with nipple markers and a lateral view would be useful to more completely evaluate the thorax in this patient with cryptogenic hemoptysis. These findings were discussed by telephone with Dr. Florene Glen. Electronically Signed   By: David  Martinique M.D.   On: 12/31/2017 13:27   Mr Abdomen Mrcp Moise Boring Contast  Result Date: 12/31/2017 CLINICAL DATA:  Abnormal liver function tests. Elevated lipase. HIV. EXAM: MRI ABDOMEN WITHOUT AND WITH CONTRAST (INCLUDING MRCP) TECHNIQUE: Multiplanar multisequence MR imaging of the abdomen was performed both before and after the administration of intravenous contrast. Heavily T2-weighted images of the biliary and pancreatic ducts were obtained, and three-dimensional MRCP images were rendered by post processing. CONTRAST:  14m MULTIHANCE GADOBENATE DIMEGLUMINE 529 MG/ML IV SOLN COMPARISON:  CT 12/30/2017 FINDINGS: Lower chest:  Lung bases are clear.  Hepatobiliary: No intrahepatic duct dilatation. No focal hepatic lesion. No enhancing hepatic lesion. Postcholecystectomy. Common bile duct normal caliber. Pancreas: Mild edema associated with the pancreas. Pancreatic duct is normal caliber. No organized fluid collections. Small amount fluid along the LEFT gutter. Contrast enhanced imaging demonstrates uniform enhancement of the pancreas Spleen: Normal spleen. Adrenals/urinary tract: Adrenal glands and kidneys are normal. Stomach/Bowel: Stomach and limited of the small bowel is unremarkable Vascular/Lymphatic: There are enlarged lymph nodes the retroperitoneum along the aorta the kidneys. For example 12 mm and 8 mm short axis lymph nodes LEFT aorta on image 67, series 12/19/2001 Musculoskeletal: No aggressive osseous lesion IMPRESSION: 1. Mild pancreatic edema associated with pancreatitis. 2. Moderate intraperitoneal free fluid. No organized fluid collections. 3. No biliary obstruction.  Postcholecystectomy. 4. Mild retroperitoneal lymphadenopathy favor HIV adenopathy. Electronically Signed   By: SSuzy BouchardM.D.   On: 12/31/2017 17:36    ASSESSMENT & PLAN:   42yo cachetic, chronically ill appearing AAF with HIV/AIDS CD4 10 (non compliant with antiretrovirals) with   1) Microcytic Anemia - hgb 9.6 on admission. Dropping to 6.6 today in the setting of FOBT +ve stool with hematemesis (likely from esophageal ulceration)  2) Leucopenia - related to lymphopenia from uncontrolled HIV/AIDS Had previous folate deficiency  3) Thrombocytopenia platelet down to around 20k Normal fibrinogen. Likely HIV associated. Worsened with sepsis and other metabolic insults -pancreatitis/hepatitis. Cannot r/o additional component of HIV associated ITP  Overall Pancytopenia is largely from uncontrolled HIV with possible HIV/AIDS associated Myelodysplasia. Additional factors worsening pancytopenia -sepsis -?MAC (given abnormal LFTs and elevated ALK PO4 with bone  marrow supression) -r/o Parvovirus -has had folate deficiency previously - to be rechecked. -poor po intake with significant weight loss. -?disseminated candidiasis in setting of abnormal LFTs and extensive esophageal involvement. -no overt evidence of lymphoma or acute leukemia.  Previous BM Bx in 10/2017 - no overt infection. Mildly hypercellular. PLAN -  transfuse PRBC prn to maintain hgb>7.5 -transfuse platelet prn for plt<10k or if bleeding (get 1hour post transfusion count on each occasion) -optimize treatment for HIV/AIDS -evaluation and optimal mx of associated infectious processes as per hospitalist/ID - would check MAC and parvovirus -check ferritin/Iron profile and give IV Feraheme is ferritin <100 -f/u on RBC folate and B12. -- would replace empirically. --concerned for use of empiric steroids for ?ITP-given infectious issues/severe candiasis/significant psych history. -could consider IVIG if pancreatitis has resolved vs Nplate -will inform Dr Jonette Eva -her primary hematologist regarding admission. Will defer to him regarding IVIG vs Nplate vs need for rpt Bone marrow biopsy.  All of the patients questions were answered with apparent satisfaction. The patient knows to call the clinic with any problems, questions or concerns.  I spent 60 minutes counseling the patient face to face. The total time spent in the appointment was 80 minutes and more than 50% was on counseling and direct patient cares.    Sullivan Lone MD [place] AAHIVMS [facility] Hematology/Oncology Physician [facility]  (Office):       [phone] (Work cell):  [phone] (Fax):           [phone]  01/04/2018 12:06 PM

## 2018-01-04 NOTE — Clinical Social Work Note (Signed)
Clinical Social Work Assessment  Patient Details  Name: [name] MRN: [id number] Date of Birth: 05-15-1976  Date of referral:  01/04/18               Reason for consult:  [employer] sought to share information with:    Permission granted to share information::     Name::        Agency::     Relationship::     Contact Information:     Housing/Transportation Living arrangements for the past 2 months:  Apartment Source of Information:  Patient Patient Interpreter Needed:  None Criminal Activity/Legal Involvement Pertinent to Current Situation/Hospitalization:  No - Comment as needed Significant Relationships:  Adult Children Lives with:  Adult Children Do you feel safe going back to the place where you live?  Yes Need for family participation in patient care:  No (Coment)  Care giving concerns: Patient from home with adult son. Patient requesting resources.   Social Worker assessment / plan: CSW met with patient at bedside. Patient alert and oriented, though lethargic. Patient requested assistance in re-applying for SSD. Patient stated she applied once and was denied. CSW advised patient to continue following up with the office of social security, as CSW is unable to assist with disability applications from the hospital. CSW assessed patient's need for additional community resources. Patient requested home health care nurse. CSW referred to [facility]; consult already in place for [facility]. PT evaluation pending for patient. CSW signing off, as no additional needs identified. Please re-consult if other needs arise during patient's admission.  Employment status:  Disabled (Comment on whether or not currently receiving Disability) Insurance information:  Medicaid In [place] PT Recommendations:  Not assessed at this time Information / Referral to community resources:  Other (Comment Required)(Social Security)  Patient/Family's Response to care: Patient  appreciative of care and resources.  Patient/Family's Understanding of and Emotional Response to Diagnosis, Current Treatment, and Prognosis: Patient with understanding of her condition and hopeful for some home health care support.  Emotional Assessment Appearance:  Appears stated age, Other (Comment Required(thin/frail) Attitude/Demeanor/Rapport:  Lethargic, Other(quiet) Affect (typically observed):  Quiet, Calm Orientation:  Oriented to Self, Oriented to Place, Oriented to  Time, Oriented to Situation Alcohol / Substance use:  Not Applicable Psych involvement (Current and /or in the community):  No (Comment)  Discharge Needs  Concerns to be addressed:  Care Coordination Readmission within the last 30 days:  No Current discharge risk:  Physical Impairment Barriers to Discharge:  Continued Medical Work up   Estanislado Emms, LCSW 01/04/2018, 3:21 PM

## 2018-01-04 NOTE — Plan of Care (Signed)
Will cont to mon 

## 2018-01-04 NOTE — Progress Notes (Signed)
Subjective: The patient was seen and examined at bedside. She states the abdominal pain has resolved but she has pain in the lower retrosternal area, nonradiating. She had 1 loose bowel movement today and denies nausea or vomiting. She has been started on a soft diet since yesterday(was able to swallow meat loaf/ macaroni and cheese without difficulty).  Objective: Vital signs in last 24 hours: Temp:  [98.5 F (36.9 C)-99.8 F (37.7 C)] 98.5 F (36.9 C) (01/20 0509) Pulse Rate:  [98-120] 98 (01/20 0509) Resp:  [16] 16 (01/20 0509) BP: (97-124)/(62-71) 101/62 (01/20 0509) SpO2:  [97 %-100 %] 97 % (01/20 0509) Weight:  [51.3 kg (113 lb 1.5 oz)] 51.3 kg (113 lb 1.5 oz) (01/20 0509) Weight change: 3.5 kg (7 lb 11.5 oz) Last BM Date: 01/03/18  PE: Frail, thinly built GENERAL: Obvious pallor, obvious icterus ABDOMEN: Soft, nondistended, nontender, normoactive bowel sounds EXTREMITIES: No deformity  Lab Results: Results for orders placed or performed during the hospital encounter of 12/29/17 (from the past 48 hour(s))  Respiratory Panel by PCR     Status: None   Collection Time: 01/02/18 10:42 AM  Result Value Ref Range   Adenovirus NOT DETECTED NOT DETECTED   Coronavirus 229E NOT DETECTED NOT DETECTED   Coronavirus HKU1 NOT DETECTED NOT DETECTED   Coronavirus NL63 NOT DETECTED NOT DETECTED   Coronavirus OC43 NOT DETECTED NOT DETECTED   Metapneumovirus NOT DETECTED NOT DETECTED   Rhinovirus / Enterovirus NOT DETECTED NOT DETECTED   Influenza A NOT DETECTED NOT DETECTED   Influenza B NOT DETECTED NOT DETECTED   Parainfluenza Virus 1 NOT DETECTED NOT DETECTED   Parainfluenza Virus 2 NOT DETECTED NOT DETECTED   Parainfluenza Virus 3 NOT DETECTED NOT DETECTED   Parainfluenza Virus 4 NOT DETECTED NOT DETECTED   Respiratory Syncytial Virus NOT DETECTED NOT DETECTED   Bordetella pertussis NOT DETECTED NOT DETECTED   Chlamydophila pneumoniae NOT DETECTED NOT DETECTED   Mycoplasma  pneumoniae NOT DETECTED NOT DETECTED  Urinalysis, Routine w reflex microscopic     Status: Abnormal   Collection Time: 01/02/18 12:41 PM  Result Value Ref Range   Color, Urine AMBER (A) YELLOW    Comment: BIOCHEMICALS MAY BE AFFECTED BY COLOR   APPearance CLEAR CLEAR   Specific Gravity, Urine 1.009 1.005 - 1.030   pH 6.0 5.0 - 8.0   Glucose, UA NEGATIVE NEGATIVE mg/dL   Hgb urine dipstick NEGATIVE NEGATIVE   Bilirubin Urine MODERATE (A) NEGATIVE   Ketones, ur NEGATIVE NEGATIVE mg/dL   Protein, ur NEGATIVE NEGATIVE mg/dL   Nitrite NEGATIVE NEGATIVE   Leukocytes, UA NEGATIVE NEGATIVE  Culture, Urine     Status: Abnormal (Preliminary result)   Collection Time: 01/02/18 12:41 PM  Result Value Ref Range   Specimen Description URINE, CLEAN CATCH    Special Requests NONE    Culture (A)     40,000 COLONIES/mL STAPHYLOCOCCUS SPECIES (COAGULASE NEGATIVE) SUSCEPTIBILITIES TO FOLLOW Performed at [facility], [address] [place], [place] [address]    Report Status PENDING   CBC with Differential/Platelet     Status: Abnormal   Collection Time: 01/02/18  5:40 PM  Result Value Ref Range   WBC 1.8 (L) 4.0 - 10.5 K/uL   RBC 2.80 (L) 3.87 - 5.11 MIL/uL   Hemoglobin 7.5 (L) 12.0 - 15.0 g/dL   HCT 21.6 (L) 36.0 - 46.0 %   MCV 77.1 (L) 78.0 - 100.0 fL   MCH 26.8 26.0 - 34.0 pg  MCHC 34.7 30.0 - 36.0 g/dL   RDW 20.5 (H) 11.5 - 15.5 %   Platelets 23 (LL) 150 - 400 K/uL    Comment: CRITICAL VALUE NOTED.  VALUE IS CONSISTENT WITH PREVIOUSLY REPORTED AND CALLED VALUE.   Neutrophils Relative % 82 %   Lymphocytes Relative 6 %   Monocytes Relative 9 %   Eosinophils Relative 2 %   Basophils Relative 1 %   Neutro Abs 1.5 (L) 1.7 - 7.7 K/uL   Lymphs Abs 0.1 (L) 0.7 - 4.0 K/uL   Monocytes Absolute 0.2 0.1 - 1.0 K/uL   Eosinophils Absolute 0.0 0.0 - 0.7 K/uL   Basophils Absolute 0.0 0.0 - 0.1 K/uL   RBC Morphology RARE NRBCs    WBC Morphology INCREASED BANDS (>20% BANDS)     Comment:  DOHLE BODIES VACUOLATED NEUTROPHILS   Lipase, blood     Status: Abnormal   Collection Time: 01/03/18  6:19 AM  Result Value Ref Range   Lipase 65 (H) 11 - 51 U/L  CBC with Differential/Platelet     Status: Abnormal   Collection Time: 01/03/18  6:25 AM  Result Value Ref Range   WBC 1.9 (L) 4.0 - 10.5 K/uL   RBC 3.05 (L) 3.87 - 5.11 MIL/uL   Hemoglobin 7.9 (L) 12.0 - 15.0 g/dL   HCT 23.3 (L) 36.0 - 46.0 %   MCV 76.4 (L) 78.0 - 100.0 fL   MCH 25.9 (L) 26.0 - 34.0 pg   MCHC 33.9 30.0 - 36.0 g/dL   RDW 20.7 (H) 11.5 - 15.5 %   Platelets 24 (LL) 150 - 400 K/uL    Comment: RESULT REPEATED AND VERIFIED SPECIMEN CHECKED FOR CLOTS CRITICAL VALUE NOTED.  VALUE IS CONSISTENT WITH PREVIOUSLY REPORTED AND CALLED VALUE.    Neutrophils Relative % 76 %   Lymphocytes Relative 7 %   Monocytes Relative 14 %   Eosinophils Relative 2 %   Basophils Relative 1 %   Neutro Abs 1.5 (L) 1.7 - 7.7 K/uL   Lymphs Abs 0.1 (L) 0.7 - 4.0 K/uL   Monocytes Absolute 0.3 0.1 - 1.0 K/uL   Eosinophils Absolute 0.0 0.0 - 0.7 K/uL   Basophils Absolute 0.0 0.0 - 0.1 K/uL   Smear Review PLATELET COUNT CONFIRMED BY SMEAR   Basic metabolic panel     Status: Abnormal   Collection Time: 01/03/18  6:25 AM  Result Value Ref Range   Sodium 128 (L) 135 - 145 mmol/L   Potassium 4.2 3.5 - 5.1 mmol/L   Chloride 103 101 - 111 mmol/L   CO2 19 (L) 22 - 32 mmol/L   Glucose, Bld 89 65 - 99 mg/dL   BUN 5 (L) 6 - 20 mg/dL   Creatinine, Ser 0.37 (L) 0.44 - 1.00 mg/dL   Calcium 8.0 (L) 8.9 - 10.3 mg/dL   GFR calc non Af Amer >60 >60 mL/min   GFR calc Af Amer >60 >60 mL/min    Comment: (NOTE) The eGFR has been calculated using the CKD EPI equation. This calculation has not been validated in all clinical situations. eGFR's persistently <60 mL/min signify possible Chronic Kidney Disease.    Anion gap 6 5 - 15  Hepatic function panel     Status: Abnormal   Collection Time: 01/03/18  6:25 AM  Result Value Ref Range   Total  Protein 5.1 (L) 6.5 - 8.1 g/dL   Albumin 1.8 (L) 3.5 - 5.0 g/dL   AST 213 (H) 15 - 41   U/L   ALT 35 14 - 54 U/L   Alkaline Phosphatase 688 (H) 38 - 126 U/L   Total Bilirubin 13.4 (H) 0.3 - 1.2 mg/dL   Bilirubin, Direct 8.3 (H) 0.1 - 0.5 mg/dL   Indirect Bilirubin 5.1 (H) 0.3 - 0.9 mg/dL  Sodium, urine, random     Status: None   Collection Time: 01/03/18 11:10 AM  Result Value Ref Range   Sodium, Ur 88 mmol/L    Comment: Performed at [facility], [address] [place], [place] [address]  Osmolality, urine     Status: None   Collection Time: 01/03/18  1:20 PM  Result Value Ref Range   Osmolality, Ur 401 300 - 900 mOsm/kg    Comment: Performed at  Hospital Lab, [address] [place], [place] [address]  Fibrinogen     Status: None   Collection Time: 01/04/18  5:34 AM  Result Value Ref Range   Fibrinogen 267 210 - 475 mg/dL  Lactate dehydrogenase     Status: Abnormal   Collection Time: 01/04/18  5:34 AM  Result Value Ref Range   LDH 271 (H) 98 - 192 U/L  APTT     Status: Abnormal   Collection Time: 01/04/18  5:34 AM  Result Value Ref Range   aPTT 44 (H) 24 - 36 seconds    Comment:        IF BASELINE aPTT IS ELEVATED, SUGGEST PATIENT RISK ASSESSMENT BE USED TO DETERMINE APPROPRIATE ANTICOAGULANT THERAPY.   Protime-INR     Status: Abnormal   Collection Time: 01/04/18  5:34 AM  Result Value Ref Range   Prothrombin Time 16.8 (H) 11.4 - 15.2 seconds   INR 1.38   CBC     Status: Abnormal   Collection Time: 01/04/18  5:34 AM  Result Value Ref Range   WBC 2.3 (L) 4.0 - 10.5 K/uL   RBC 2.58 (L) 3.87 - 5.11 MIL/uL   Hemoglobin 6.9 (LL) 12.0 - 15.0 g/dL    Comment: RESULT REPEATED AND VERIFIED CRITICAL RESULT CALLED TO, READ BACK BY AND VERIFIED WITH: DIXON,M AT 0720 ON [id number] BY HOOKER,B    HCT 19.9 (L) 36.0 - 46.0 %   MCV 77.1 (L) 78.0 - 100.0 fL   MCH 26.7 26.0 - 34.0 pg   MCHC 34.7 30.0 - 36.0 g/dL   RDW 21.4 (H) 11.5 - 15.5 %   Platelets 24 (LL) 150 - 400  K/uL    Comment: RESULT REPEATED AND VERIFIED SPECIMEN CHECKED FOR CLOTS CRITICAL VALUE NOTED.  VALUE IS CONSISTENT WITH PREVIOUSLY REPORTED AND CALLED VALUE.   Hepatic function panel     Status: Abnormal   Collection Time: 01/04/18  5:34 AM  Result Value Ref Range   Total Protein 4.6 (L) 6.5 - 8.1 g/dL   Albumin 1.5 (L) 3.5 - 5.0 g/dL   AST 236 (H) 15 - 41 U/L   ALT 37 14 - 54 U/L   Alkaline Phosphatase 570 (H) 38 - 126 U/L   Total Bilirubin 14.8 (H) 0.3 - 1.2 mg/dL   Bilirubin, Direct 9.2 (H) 0.1 - 0.5 mg/dL   Indirect Bilirubin 5.6 (H) 0.3 - 0.9 mg/dL  Basic metabolic panel     Status: Abnormal   Collection Time: 01/04/18  5:34 AM  Result Value Ref Range   Sodium 132 (L) 135 - 145 mmol/L   Potassium 3.7 3.5 - 5.1 mmol/L   Chloride 105 101 - 111 mmol/L   CO2 23   22 - 32 mmol/L   Glucose, Bld 80 65 - 99 mg/dL   BUN 8 6 - 20 mg/dL   Creatinine, Ser 0.30 (L) 0.44 - 1.00 mg/dL   Calcium 8.0 (L) 8.9 - 10.3 mg/dL   GFR calc non Af Amer >60 >60 mL/min   GFR calc Af Amer >60 >60 mL/min    Comment: (NOTE) The eGFR has been calculated using the CKD EPI equation. This calculation has not been validated in all clinical situations. eGFR's persistently <60 mL/min signify possible Chronic Kidney Disease.    Anion gap 4 (L) 5 - 15    Studies/Results: No results found.  Medications: I have reviewed the patient's current medications.  Assessment: 1. Dysphagia, esophageal stricture(not dilated due to severe esophagitis and thrombocytopenia), biopsies pending for CMV  2. Pancreatitis(unclear etiology, no history of alcohol use, post cholecystectomy, triglycerides 400 on 12/30/17,? Related to medications).  3. Abnormal LFTs, worsening total bilirubin,elevated ALP and AST(intrahepatic cholestasis)? Medication induced No signs of encephalopathy, PT/INR/PTT 16.8/1.38/44 respectively.  4. Pancytopenia(hemoglobin dropped from 7.9-6.9, platelet consistently low at 24,000)  5. Poorly  controlled HIV  Plan: 1. Follow-up biopsy report, treat CMV if found, continue PPI twice a day and [name], plan EGD later this week for dilatation if platelet counts reasonable.  2. Able to tolerate soft diet, on Ringer's lactate at 100 mL an hour, okay to decrease to 75 mL per hour, on morphine 2 mg IV every 3 hours when necessary for severe pain. On hydrocodone 5/325 1-2 tablets when necessary every 4 hours.  3. Fluconazole discontinued yesterday due to worsening LFTs, dapsone discontinued for the same reason, patient on Biktarvy for HIV.  Discussed with Dr. Baxter Flattery from ID and Dr. Florene Glen( patient's hospitalist).    Ronnette Juniper 01/04/2018, 9:05 AM   Pager [phone] If no answer or after 5 PM call [phone]

## 2018-01-04 NOTE — Progress Notes (Signed)
CRITICAL VALUE ALERT  Critical Value:  hgb 6.9  Date & Time Notied:  0723  Provider Notified: [phone]  Orders Received/Actions taken: 0725

## 2018-01-04 NOTE — Progress Notes (Addendum)
PROGRESS NOTE    [name]  HYW:[id number] DOB: 02/06/1976 DOA: 12/29/2017 PCP: Nolene Ebbs, MD   Brief Narrative:  [name] is a 42 y.o. female with medical history significant of HIV AIDS, Candida esophagitis, GERD, bipolar disorder, asthma, who comes in for 2 weeks of progressive worsening epigastric abdominal pain.  She had lab and imaging findings c/w pancreatitis.   Assessment & Plan:   Principal Problem:   Pancreatitis Active Problems:   HIV disease ([place])   Bipolar disorder ([place])   Hypokalemia   Esophageal stricture   Dysphagia   Candidal esophagitis ([facility])   Severe protein-calorie malnutrition ([facility])   Abdominal pain   Pancreatitis: Unclear etiology.  elevated bili and alk phos concerning for pancreatitis related to stone despite hx of cholecystectomy, but MRCP without biliary obstruction.   Discussed antiretroviral as possible etiology with ID, but thought less likely.   Dapsone occasionally can cause pancreatitis and cholestasis, will hold for now (discussed with ID, consider challenging again at later point).   Will stop olanzapine as it is also associated with hepatitis and pancreatitis. Of note, she says the only medicines she was consistently taking prior to her admission were dapsone and cogentin (she hadn't taken the biktarvy in a few weeks and she was taking the olanzapine only every once in a while when she was having trouble sleeping). Triglycerides 400. Lipase improving.    Advance to mech soft diet - ADAT Antiemetics, analgesics IVF MRCP with findings c/w pancreatitis, but no biliary obstruction GI c/s, appreciate recs  Hepatitis: elevated LFTs as noted above (worsening bili today as well as AST).  MRCP without obstruction.  Acute hepatitis panel negative.  CMV positive (but low quant).  Thought medication related. Fluconazole has been discontinued.   Olanzapine d/c'd.  Dapsone d/c'd.    Fever:  Isolated.  Pt appears stable.   CXR with  possible interstitial edema, normal BNP.   RVP negative C diff pending - pt with only about 2 loose stools over the past day, nursing has been unable to collect sample.  Given lack of significant diarrhea, lower suspicion for c diff, will discontinue unless sx worsening.  Ucx coag neg staph, suspect contaminant as UA clean and pt without sx of UTI Bcx pending Abx d/c'd.  Continue to monitor.  Low threshold to reimage with pancreatitis above.   Esophagitis: EGD with severe esophagitis and esophageal stenosis, appeared more like reflux.  IV PPI BID, biopsies pending (HSV and CMV cx pending as well).  Needs dilation after biopsies result and platelets improved.  [name] added.   HIV  AIDS:  ID c/s, appreciate recs Continue antiretrovirals D/c dapsone as can cause cholestasis and pancreatitis (consider rechallenging at later point) (pt allergic to bactrim)   Dysphagia  Candidal Esophagitis  Esophageal Stricture  Weight loss:   Discussed with ID, rec CMV quant (positive <200) and GI c/s as above Discontinued fluconazole as above, now on nystatin Dietician  Hematemesis:  Suspect this was related to severe esophagitis as noted above.  PPI BID and carafate  Anemia: suspect degree of dilution with IVF with pancreatitis, but with downtrending and at borderline will discuss transfusion with patient.  [ ]  hemoccult  Positive (no melena or hematochezia) S/p 1 unit pRBC Repeat transfusion today, no signs of bleeding  Thrombocytopenia: this has worsened since 2018.  Possibly related to HIV.  Will continue to monitor for now.  S/p 1 unit platelets 1/18 prior to EGD  Pancytopenia:  Likely related to  uncontrolled HIV.  Will c/s heme regarding platelets with plan for eventual repeat procedure coming up. Normal fibrinogen, normal INR, slightly elevated PTT, elevated LDH  Bipolar disorder: continue home meds as able. Stop olanzapine as above.   Hypokalemia: f/u mg, replete prn.  K in IVF.    Severe Protein Calorie malnutrition: dietician  NAGMA: improved, continue to monitor  Tremor: Check B12, TSH.  More with movement.  Started around yesterday.  Will continue to monitor.  No focal deficits.   Hyponatremia: improving with IVF, continue to monitor.   DVT prophylaxis: SCD Code Status: full code Family Communication: none at bedside Disposition Plan: pending  Consultants:   ID and GI  Procedures:  EGD 1/18 Impression: Esophageal stenosis. Severe esophagitis. Biopsied. Moderate Sedation: - Clear liquid diet. - Continue present medications. - Use Protonix (pantoprazole) 40 mg IV daily BID. Intensify PPI therapy for a few days, and elevate head of bed. Check biopsies. Will need repeat EGD with dilation after biopsies are checked and inflammation calms down and we try and make sure platlets are above 50,000.  Antimicrobials:  Anti-infectives (From admission, onward)   Start     Dose/Rate Route Frequency Ordered Stop   01/02/18 2200  vancomycin (VANCOCIN) 500 mg in sodium chloride 0.9 % 100 mL IVPB  Status:  Discontinued     500 mg 100 mL/hr over 60 Minutes Intravenous Every 12 hours 01/02/18 1111 01/02/18 1559   01/02/18 1400  ceFEPIme (MAXIPIME) 1 g in dextrose 5 % 50 mL IVPB  Status:  Discontinued     1 g 100 mL/hr over 30 Minutes Intravenous Every 8 hours 01/02/18 1111 01/02/18 1559   01/02/18 1200  vancomycin (VANCOCIN) IVPB 1000 mg/200 mL premix     1,000 mg 200 mL/hr over 60 Minutes Intravenous  Once 01/02/18 1111 01/02/18 1344   12/31/17 2000  dapsone tablet 100 mg  Status:  Discontinued     100 mg Oral Daily 12/31/17 1859 01/03/18 1619   12/31/17 1000  fluconazole (DIFLUCAN) IVPB 100 mg  Status:  Discontinued     100 mg 50 mL/hr over 60 Minutes Intravenous Every 24 hours 12/30/17 1001 12/30/17 1431   12/31/17 1000  fluconazole (DIFLUCAN) IVPB 400 mg  Status:  Discontinued     400 mg 100 mL/hr over 120 Minutes Intravenous Every 24 hours 12/30/17 1431  01/03/18 1111   12/30/17 1100  fluconazole (DIFLUCAN) IVPB 200 mg     200 mg 100 mL/hr over 60 Minutes Intravenous  Once 12/30/17 1001 12/30/17 1220   12/30/17 1000  bictegravir-emtricitabine-tenofovir AF (BIKTARVY) 50-200-25 MG per tablet 1 tablet     1 tablet Oral Daily 12/30/17 0933        Subjective: Feeling better.   Objective: Vitals:   01/03/18 1327 01/03/18 1447 01/03/18 2144 01/04/18 0509  BP: 97/62 124/71 116/70 101/62  Pulse: (!) 120 (!) 101 99 98  Resp: 16 16 16 16   Temp: 99.8 F (37.7 C) 99 F (37.2 C) 99.2 F (37.3 C) 98.5 F (36.9 C)  TempSrc: Oral Oral Oral Oral  SpO2: 99% 97% 100% 97%  Weight:    51.3 kg (113 lb 1.5 oz)  Height:        Intake/Output Summary (Last 24 hours) at 01/04/2018 1140 Last data filed at 01/04/2018 0600 Gross per 24 hour  Intake 1900 ml  Output 450 ml  Net 1450 ml   Filed Weights   01/01/18 0603 01/03/18 0655 01/04/18 0509  Weight: 47.8 kg (105 lb 6.1  oz) 47.8 kg (105 lb 6.1 oz) 51.3 kg (113 lb 1.5 oz)    Examination:  General: No acute distress.  Scleral icterus. Cardiovascular: Heart sounds show a regular rate, and rhythm. No gallops or rubs. No murmurs. No JVD. Lungs: Clear to auscultation bilaterally with good air movement. No rales, rhonchi or wheezes. Abdomen: Soft, diffusely tender, improving gradually, nondistended with normal active bowel sounds. No masses. No hepatosplenomegaly. Neurological: Alert and oriented 3. Moves all extremities 4 with equal strength. Cranial nerves II through XII grossly intact.  Tremor of bilateral hands R>L worse with movement.  Skin: Warm and dry. No rashes or lesions. Extremities: No clubbing or cyanosis. No edema. Pedal pulses 2+. Psychiatric: Mood and affect are normal. Insight and judgment are appropriate.   Data Reviewed: I have personally reviewed following labs and imaging studies  CBC: Recent Labs  Lab 12/31/17 0727  01/01/18 0534 01/02/18 0507 01/02/18 1740  01/03/18 0625 01/04/18 0534 01/04/18 0832  WBC 2.1*  --  2.0* 1.7* 1.8* 1.9* 2.3*  --   NEUTROABS 1.8  --   --   --  1.5* 1.5*  --   --   HGB 7.4*   < > 7.9* 7.8* 7.5* 7.9* 6.9* 6.6*  HCT 23.1*   < > 23.5* 22.3* 21.6* 23.3* 19.9* 19.1*  MCV 78.6  --  78.9 76.6* 77.1* 76.4* 77.1*  --   PLT 30*  --  25* 23* 23* 24* 24*  --    < > = values in this interval not displayed.   Basic Metabolic Panel: Recent Labs  Lab 12/30/17 0700  12/31/17 0727 01/01/18 0534 01/02/18 0507 01/03/18 0625 01/04/18 0534  NA  --    < > 142 141 132* 128* 132*  K  --    < > 2.8* 3.7 4.3 4.2 3.7  CL  --    < > 115* 117* 109 103 105  CO2  --    < > 21* 19* 18* 19* 23  GLUCOSE  --    < > 95 86 83 89 80  BUN  --    < > 14 9 <5* 5* 8  CREATININE  --    < > 0.61 0.46 0.42* 0.37* 0.30*  CALCIUM  --    < > 8.1* 8.4* 8.2* 8.0* 8.0*  MG 2.0  --   --  1.8  --   --   --    < > = values in this interval not displayed.   GFR: Estimated Creatinine Clearance: 75.7 mL/min (A) (by C-G formula based on SCr of 0.3 mg/dL (L)). Liver Function Tests: Recent Labs  Lab 12/31/17 0727 01/01/18 0534 01/02/18 0507 01/03/18 0625 01/04/18 0534  AST 106* 99* 149* 213* 236*  ALT 23 21 24  35 37  ALKPHOS 744* 672* 672* 688* 570*  BILITOT 11.6* 11.5* 11.1* 13.4* 14.8*  PROT 5.7* 5.3* 4.9* 5.1* 4.6*  ALBUMIN 2.1* 1.9* 1.7* 1.8* 1.5*   Recent Labs  Lab 12/29/17 1940 12/31/17 0727 01/01/18 0534 01/02/18 0725 01/03/18 0619  LIPASE 1,305* 809* 208* 86* 65*   No results for input(s): AMMONIA in the last 168 hours. Coagulation Profile: Recent Labs  Lab 01/01/18 1522 01/02/18 0507 01/04/18 0534  INR 1.29 1.31 1.38   Cardiac Enzymes: No results for input(s): CKTOTAL, CKMB, CKMBINDEX, TROPONINI in the last 168 hours. BNP (last 3 results) No results for input(s): PROBNP in the last 8760 hours. HbA1C: No results for input(s): HGBA1C in the last 72 hours.  CBG: No results for input(s): GLUCAP in the last 168 hours. Lipid  Profile: No results for input(s): CHOL, HDL, LDLCALC, TRIG, CHOLHDL, LDLDIRECT in the last 72 hours. Thyroid Function Tests: No results for input(s): TSH, T4TOTAL, FREET4, T3FREE, THYROIDAB in the last 72 hours. Anemia Panel: No results for input(s): VITAMINB12, FOLATE, FERRITIN, TIBC, IRON, RETICCTPCT in the last 72 hours. Sepsis Labs: No results for input(s): PROCALCITON, LATICACIDVEN in the last 168 hours.  Recent Results (from the past 240 hour(s))  Culture, blood (routine x 2)     Status: None (Preliminary result)   Collection Time: 01/02/18  7:24 AM  Result Value Ref Range Status   Specimen Description BLOOD LEFT HAND  Final   Special Requests IN PEDIATRIC BOTTLE Blood Culture adequate volume  Final   Culture   Final    NO GROWTH 1 DAY Performed at [facility], [address], [place], [place] [address]    Report Status PENDING  Incomplete  Culture, blood (routine x 2)     Status: None (Preliminary result)   Collection Time: 01/02/18  7:25 AM  Result Value Ref Range Status   Specimen Description BLOOD RIGHT ANTECUBITAL  Final   Special Requests   Final    BOTTLES DRAWN AEROBIC AND ANAEROBIC Blood Culture adequate volume   Culture   Final    NO GROWTH 1 DAY Performed at [facility], [place] [address], [place], [place] [address]    Report Status PENDING  Incomplete  Respiratory Panel by PCR     Status: None   Collection Time: 01/02/18 10:42 AM  Result Value Ref Range Status   Adenovirus NOT DETECTED NOT DETECTED Final   Coronavirus 229E NOT DETECTED NOT DETECTED Final   Coronavirus HKU1 NOT DETECTED NOT DETECTED Final   Coronavirus NL63 NOT DETECTED NOT DETECTED Final   Coronavirus OC43 NOT DETECTED NOT DETECTED Final   Metapneumovirus NOT DETECTED NOT DETECTED Final   Rhinovirus / Enterovirus NOT DETECTED NOT DETECTED Final   Influenza A NOT DETECTED NOT DETECTED Final   Influenza B NOT DETECTED NOT DETECTED Final   Parainfluenza Virus 1 NOT DETECTED NOT  DETECTED Final   Parainfluenza Virus 2 NOT DETECTED NOT DETECTED Final   Parainfluenza Virus 3 NOT DETECTED NOT DETECTED Final   Parainfluenza Virus 4 NOT DETECTED NOT DETECTED Final   Respiratory Syncytial Virus NOT DETECTED NOT DETECTED Final   Bordetella pertussis NOT DETECTED NOT DETECTED Final   Chlamydophila pneumoniae NOT DETECTED NOT DETECTED Final   Mycoplasma pneumoniae NOT DETECTED NOT DETECTED Final  Culture, Urine     Status: Abnormal   Collection Time: 01/02/18 12:41 PM  Result Value Ref Range Status   Specimen Description URINE, CLEAN CATCH  Final   Special Requests NONE  Final   Culture (A)  Final    40,000 COLONIES/mL STAPHYLOCOCCUS SPECIES (COAGULASE NEGATIVE)   Report Status 01/04/2018 FINAL  Final   Organism ID, Bacteria STAPHYLOCOCCUS SPECIES (COAGULASE NEGATIVE) (A)  Final      Susceptibility   Staphylococcus species (coagulase negative) - MIC*    CIPROFLOXACIN <=0.5 SENSITIVE Sensitive     GENTAMICIN <=0.5 SENSITIVE Sensitive     NITROFURANTOIN <=16 SENSITIVE Sensitive     OXACILLIN >=4 RESISTANT Resistant     TETRACYCLINE >=16 RESISTANT Resistant     VANCOMYCIN 1 SENSITIVE Sensitive     TRIMETH/SULFA <=10 SENSITIVE Sensitive     CLINDAMYCIN RESISTANT Resistant     RIFAMPIN <=0.5 SENSITIVE Sensitive  Inducible Clindamycin POSITIVE Resistant     * 40,000 COLONIES/mL STAPHYLOCOCCUS SPECIES (COAGULASE NEGATIVE)         Radiology Studies: No results found.      Scheduled Meds: . benztropine  1 mg Oral Daily  . bictegravir-emtricitabine-tenofovir AF  1 tablet Oral Daily  . feeding supplement  1 Container Oral TID BM  . multivitamin with minerals  1 tablet Oral Daily  . nystatin  5 mL Oral QID  . pantoprazole (PROTONIX) IV  40 mg Intravenous Q12H  . sucralfate  1 g Oral TID WC & HS   Continuous Infusions: . sodium chloride    . lactated ringers 75 mL/hr at 01/04/18 1033     LOS: 5 days    Time spent: over 16 min    Fayrene Helper,  MD [facility] Pager [phone]  If 7PM-7AM, please contact night-coverage [email or web address] Password [email or web address] 01/04/2018, 11:40 AM

## 2018-01-05 ENCOUNTER — Inpatient Hospital Stay (HOSPITAL_COMMUNITY): Payer: Medicaid Other

## 2018-01-05 ENCOUNTER — Encounter (HOSPITAL_COMMUNITY): Payer: Self-pay | Admitting: Radiology

## 2018-01-05 DIAGNOSIS — E611 Iron deficiency: Secondary | ICD-10-CM

## 2018-01-05 LAB — CBC
HCT: 22.9 % — ABNORMAL LOW (ref 36.0–46.0)
HCT: 23.8 % — ABNORMAL LOW (ref 36.0–46.0)
HEMOGLOBIN: 8 g/dL — AB (ref 12.0–15.0)
Hemoglobin: 7.7 g/dL — ABNORMAL LOW (ref 12.0–15.0)
MCH: 25.2 pg — AB (ref 26.0–34.0)
MCH: 25.2 pg — ABNORMAL LOW (ref 26.0–34.0)
MCHC: 33.6 g/dL (ref 30.0–36.0)
MCHC: 33.6 g/dL (ref 30.0–36.0)
MCV: 74.8 fL — AB (ref 78.0–100.0)
MCV: 74.8 fL — AB (ref 78.0–100.0)
PLATELETS: 9 10*3/uL — AB (ref 150–400)
Platelets: 15 10*3/uL — CL (ref 150–400)
RBC: 3.06 MIL/uL — ABNORMAL LOW (ref 3.87–5.11)
RBC: 3.18 MIL/uL — ABNORMAL LOW (ref 3.87–5.11)
RDW: 22 % — ABNORMAL HIGH (ref 11.5–15.5)
RDW: 22.3 % — AB (ref 11.5–15.5)
WBC: 2.8 10*3/uL — ABNORMAL LOW (ref 4.0–10.5)
WBC: 3.1 10*3/uL — AB (ref 4.0–10.5)

## 2018-01-05 LAB — BLOOD GAS, VENOUS
Acid-base deficit: 4.8 mmol/L — ABNORMAL HIGH (ref 0.0–2.0)
Bicarbonate: 20.1 mmol/L (ref 20.0–28.0)
O2 Saturation: 48.4 %
PH VEN: 7.332 (ref 7.250–7.430)
Patient temperature: 98.6
pCO2, Ven: 39 mmHg — ABNORMAL LOW (ref 44.0–60.0)
pO2, Ven: 31.6 mmHg — CL (ref 32.0–45.0)

## 2018-01-05 LAB — HEPATIC FUNCTION PANEL
ALBUMIN: 1.5 g/dL — AB (ref 3.5–5.0)
ALT: 40 U/L (ref 14–54)
AST: 234 U/L — ABNORMAL HIGH (ref 15–41)
Alkaline Phosphatase: 510 U/L — ABNORMAL HIGH (ref 38–126)
BILIRUBIN TOTAL: 16.7 mg/dL — AB (ref 0.3–1.2)
Total Protein: 4.6 g/dL — ABNORMAL LOW (ref 6.5–8.1)

## 2018-01-05 LAB — IRON AND TIBC
Iron: 23 ug/dL — ABNORMAL LOW (ref 28–170)
Saturation Ratios: 14 % (ref 10.4–31.8)
TIBC: 165 ug/dL — ABNORMAL LOW (ref 250–450)
UIBC: 142 ug/dL

## 2018-01-05 LAB — BASIC METABOLIC PANEL
Anion gap: 5 (ref 5–15)
BUN: 10 mg/dL (ref 6–20)
CHLORIDE: 109 mmol/L (ref 101–111)
CO2: 21 mmol/L — ABNORMAL LOW (ref 22–32)
CREATININE: 0.3 mg/dL — AB (ref 0.44–1.00)
Calcium: 8.1 mg/dL — ABNORMAL LOW (ref 8.9–10.3)
GFR calc Af Amer: >60 mL/min (ref >60)
GLUCOSE: 73 mg/dL (ref 65–99)
Potassium: 3.5 mmol/L (ref 3.5–5.1)
SODIUM: 135 mmol/L (ref 135–145)

## 2018-01-05 LAB — FOLATE RBC
FOLATE, HEMOLYSATE: 249.4 ng/mL
FOLATE, RBC: 1334 ng/mL (ref >498)
HEMATOCRIT: 18.7 % — AB (ref 34.0–46.6)

## 2018-01-05 LAB — VITAMIN B12: VITAMIN B 12: 3087 pg/mL — AB (ref 180–914)

## 2018-01-05 LAB — AMMONIA: Ammonia: 43 umol/L — ABNORMAL HIGH (ref 9–35)

## 2018-01-05 LAB — HAPTOGLOBIN: Haptoglobin: 25 mg/dL — ABNORMAL LOW (ref 34–200)

## 2018-01-05 LAB — FERRITIN: FERRITIN: 1933 ng/mL — AB (ref 11–307)

## 2018-01-05 MED ORDER — SODIUM CHLORIDE 0.9 % IV SOLN: 750.0000 mg | Freq: Once | INTRAVENOUS | Status: DC

## 2018-01-05 MED ORDER — SODIUM CHLORIDE 0.9 % IV SOLN
200.0000 mg | INTRAVENOUS | Status: DC
  Administered 2018-01-05 – 2018-01-19 (×14): 200 mg via INTRAVENOUS
  Filled 2018-01-05 (×16): qty 200

## 2018-01-05 MED ORDER — OXYCODONE HCL 5 MG PO TABS
2.5000 mg | ORAL_TABLET | Freq: Four times a day (QID) | ORAL | Status: DC | PRN
  Administered 2018-01-05 – 2018-01-10 (×9): 2.5 mg via ORAL
  Filled 2018-01-05 (×9): qty 1

## 2018-01-05 MED ORDER — BICTEGRAVIR-EMTRICITAB-TENOFOV 50-200-25 MG PO TABS
1.0000 | ORAL_TABLET | Freq: Every day | ORAL | Status: DC
  Administered 2018-01-06: 1 via ORAL
  Filled 2018-01-05: qty 1

## 2018-01-05 MED ORDER — METHYLPREDNISOLONE SODIUM SUCC 125 MG IJ SOLR
125.0000 mg | Freq: Once | INTRAMUSCULAR | Status: AC
  Administered 2018-01-05: 125 mg via INTRAVENOUS
  Filled 2018-01-05: qty 2

## 2018-01-05 MED ORDER — SODIUM CHLORIDE 0.9 % IV SOLN
Freq: Once | INTRAVENOUS | Status: AC
  Administered 2018-01-05: 10:00:00 via INTRAVENOUS

## 2018-01-05 MED ORDER — LACTULOSE 10 GM/15ML PO SOLN
20.0000 g | Freq: Three times a day (TID) | ORAL | Status: DC
  Administered 2018-01-05 (×3): 20 g via ORAL
  Filled 2018-01-05 (×3): qty 30

## 2018-01-05 MED ORDER — SODIUM CHLORIDE 0.9 % IV SOLN
510.0000 mg | Freq: Once | INTRAVENOUS | Status: AC
  Administered 2018-01-05: 510 mg via INTRAVENOUS
  Filled 2018-01-05: qty 17

## 2018-01-05 MED ORDER — SODIUM CHLORIDE 0.9 % IV SOLN
40.0000 mg | Freq: Once | INTRAVENOUS | Status: AC
  Administered 2018-01-05: 40 mg via INTRAVENOUS
  Filled 2018-01-05: qty 4

## 2018-01-05 MED ORDER — SODIUM CHLORIDE 0.9 % IV SOLN: Freq: Once | INTRAVENOUS | Status: AC

## 2018-01-05 NOTE — Progress Notes (Signed)
PT Cancellation Note  Patient Details Name: LEONTINA SKIDMORE MRN: [id number] DOB: [birth year]   Cancelled Treatment:    Reason Eval/Treat Not Completed: Medical issues which prohibited therapy. Transferring to SDU.   Claretha Cooper 01/05/2018, 9:17 AM Tresa Endo PT [phone]

## 2018-01-05 NOTE — Care Management Note (Signed)
Case Management Note  Patient Details  Name: MISTIE ADNEY MRN: [id number] Date of Birth: 04/28/1976  Subjective/Objective: Transfer to SDU-[facility], HIV,malnourished.ID,ONC,RD already following. Declined PT.From home.                    Action/Plan:d/c plan home.   Expected Discharge Date:  (unknown)               Expected Discharge Plan:  Home/Self Care  In-House Referral:     Discharge planning Services  CM Consult  Post Acute Care Choice:    Choice offered to:  Patient  DME Arranged:    DME Agency:     HH Arranged:    [place] Agency:     Status of Service:  In process, will continue to follow  If discussed at Long Length of Stay Meetings, dates discussed:    Additional Comments:  Dessa Phi, RN 01/05/2018, 9:42 AM

## 2018-01-05 NOTE — Progress Notes (Signed)
   01/05/18 0828  What Happened  Was fall witnessed? No  Was patient injured? No  Patient found on floor  Found by Staff-comment Jed Limerick)  Stated prior activity other (comment) (pt does not answer why she was getting up)  Follow Up  MD notified Dr Florene Glen  Time MD notified 0830  Adult Fall Risk Assessment  Risk Factor Category (scoring not indicated) Fall has occurred during this admission (document High fall risk);High fall risk per protocol (document High fall risk)  Patient's Fall Risk High Fall Risk (>13 points)  Adult Fall Risk Interventions  Required Bundle Interventions *See Row Information* High fall risk - low, moderate, and high requirements implemented  Additional Interventions Room near nurses station  Screening for Fall Injury Risk  Risk For Fall Injury- See Row Information  Bleeding Risk - anticoagulation (not prophylaxis)  Required Injury Bundle Interventions *See Row Information* Injury Bundle Implemented Except Low Bed  Screening for Fall Injury Risk Interventions  Specialty Low Bed Contraindicated [place] Smart Bed  Vitals  BP 110/77  BP Location Right Arm  BP Method Automatic  Patient Position (if appropriate) Lying  Pulse Rate (!) 107  Oxygen Therapy  SpO2 97 %  O2 Device Room Air  Pain Assessment  Pain Assessment 0-10  Pain Score 0  Neurological  Neuro (WDL) X  Level of Consciousness Alert  Orientation Level Oriented X4  Cognition Poor attention/concentration  Speech Delayed responses  Musculoskeletal  Musculoskeletal (WDL) X  Assistive Device BSC  Generalized Weakness Yes  Weight Bearing Restrictions No  Integumentary  Integumentary (WDL) X  Skin Color Jaundice;Other (Comment)  Skin Integrity Intact

## 2018-01-05 NOTE — Progress Notes (Signed)
PROGRESS NOTE    [name]  TDD:[id number] DOB: [birth year] DOA: 12/29/2017 PCP: Nolene Ebbs, MD   Brief Narrative:  [name] is a 42 y.o. female with medical history significant of HIV AIDS, Candida esophagitis, GERD, bipolar disorder, asthma, who comes in for 2 weeks of progressive worsening epigastric abdominal pain.  She had lab and imaging findings c/w pancreatitis.   Assessment & Plan:   Principal Problem:   Pancreatitis Active Problems:   HIV disease ([place])   Bipolar disorder ([place])   Hypokalemia   Esophageal stricture   Dysphagia   Candidal esophagitis (H[facility])   Severe protein-calorie malnutrition (HCC)   Abdominal pain  Acute Encephalopathy:  With below, this may be hepatic encephalopathy with progressively worsening liver function tests.  She's moving all extremities.  No clear asterixis on exam.  Will stop morphine/oxyodone, benztropine with her AMS.  Ammonia Head CT EEG (she bit her tongue) VBG Consider starting lactulose  Fall: pt found down this morning by nursing.  Confused.  She denies hitting her head.  No obvious injuries on my exam.  Moving all extremities.  Head CT as above.  Suspect 2/2 above and deconditioning.   Fall precautions.   Hepatitis: Worsening LFT's, continuing to worsen today.  She's some confusion today, possibly due to hepatic encephalopathy.  MRCP without obstruction.  Acute hepatitis panel negative.  CMV positive (but low quant).  Thought to be medication related, but unclear which may have been inciting.  Fluconazole d/c'd Olanzapine d/c'd.  Dapsone d/c'd.  Heme rec considering MAC infection, will discuss with ID - repeat blood cx for MAC  Pancreatitis: Unclear etiology.  elevated bili and alk phos concerning for pancreatitis related to stone despite hx of cholecystectomy, but MRCP without biliary obstruction.   Discussed antiretrovirals as possible etiology with ID, but thought less likely.   Dapsone occasionally can cause  pancreatitis and cholestasis, will hold for now (discussed with ID, consider challenging again at later point).   Will stop olanzapine as it is also associated with hepatitis and pancreatitis. Of note, she says the only medicines she was consistently taking prior to her admission were dapsone and cogentin (she hadn't taken the biktarvy in a few weeks and she was taking the olanzapine only every once in a while when she was having trouble sleeping). Triglycerides 400. Lipase improving.    Advance to mech soft diet - ADAT Antiemetics, analgesics IVF MRCP with findings c/w pancreatitis, but no biliary obstruction GI c/s, appreciate recs  Fever:  Isolated.  Pt appears stable.   CXR with possible interstitial edema, normal BNP.   RVP negative C diff pending - pt with only about 2 loose stools over the past day, nursing has been unable to collect sample.  Given lack of significant diarrhea, lower suspicion for c diff, will discontinue unless sx worsening.  Ucx coag neg staph, suspect contaminant as UA clean and pt without sx of UTI Bcx pending Abx d/c'd.  Continue to monitor.  Low threshold to reimage with pancreatitis above.   Esophagitis: EGD with severe esophagitis and esophageal stenosis, appeared more like reflux.  IV PPI BID, biopsies pending.  Needs dilation after biopsies result and platelets improved.  [name] added.  Add eraxis given extensive candida on biopsy CMV cx NGTD Discussed with pathologist -> notable for ulcer and extensive candida.   Unable to stain effectively for HSV as no squamous mucosa He's going to add on stain for CMV _0  follow surgical path for updates  HIV  AIDS:  ID c/s, appreciate recs Continue antiretrovirals D/c dapsone as can cause cholestasis and pancreatitis (consider rechallenging at later point) (pt allergic to bactrim)   Dysphagia  Candidal Esophagitis  Esophageal Stricture  Weight loss:   Discussed with ID, rec CMV quant (positive <200) and GI  c/s as above Discontinued fluconazole as above, now on nystatin Dietician  Hematemesis:  Suspect this was related to severe esophagitis as noted above.  PPI BID and carafate  Anemia: suspect degree of dilution with IVF with pancreatitis, but also likely component of ABLA with esophagitis. Transfuse for <7.5 per heme Labs suggestive of iron deficiency and AOCD with elevated ferritin.  Elevated B12.  Folate pending.  Feraheme 1/21 per heme _0  hemoccult  Positive (no melena or hematochezia) S/p 2 unit pRBC during this admission  Thrombocytopenia: this has worsened since 2018.  Likely related to HIV.  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  Likely worsened in setting of pancreatitis and hepatitis.  Transfuse for bleeding or platelets <10K 1 unit platelets 1/18 prior to EGD 1 unit platelets today 1/21 S/p solumedrol x1 today Appreciate heme recs  Pancytopenia:  Likely related to uncontrolled HIV.  Normal fibrinogen, normal INR, slightly elevated PTT, elevated LDH _1  parvovirus sent _2  blood cx for MAC as noted above _3  follow folate, pending  Appreciate heme recs (consider IVIG vs Nplate vs repeat bx?)  Bipolar disorder: continue home meds as able. Stop olanzapine as above.   Hypokalemia: f/u mg, replete prn.  K in IVF.   Severe Protein Calorie malnutrition: dietician  NAGMA: improved, continue to monitor  Tremor: Check B12, TSH.  More with movement.  Started around yesterday.  Will continue to monitor.  No focal deficits. Actually seems improved today.   Hyponatremia: improving with IVF, continue to monitor.   DVT prophylaxis: SCD Code Status: full code Family Communication: none at bedside Disposition Plan: pending  Consultants:   ID and GI  Procedures:  EGD 1/18 Impression: Esophageal stenosis. Severe esophagitis. Biopsied. Moderate Sedation: - Clear liquid diet. - Continue present medications. - Use Protonix (pantoprazole) 40 mg IV daily BID. Intensify PPI therapy for  a few days, and elevate head of bed. Check biopsies. Will need repeat EGD with dilation after biopsies are checked and inflammation calms down and we try and make sure platlets are above 50,000.  Antimicrobials:  Anti-infectives (From admission, onward)   Start     Dose/Rate Route Frequency Ordered Stop   01/02/18 2200  vancomycin (VANCOCIN) 500 mg in sodium chloride 0.9 % 100 mL IVPB  Status:  Discontinued     500 mg 100 mL/hr over 60 Minutes Intravenous Every 12 hours 01/02/18 1111 01/02/18 1559   01/02/18 1400  ceFEPIme (MAXIPIME) 1 g in dextrose 5 % 50 mL IVPB  Status:  Discontinued     1 g 100 mL/hr over 30 Minutes Intravenous Every 8 hours 01/02/18 1111 01/02/18 1559   01/02/18 1200  vancomycin (VANCOCIN) IVPB 1000 mg/200 mL premix     1,000 mg 200 mL/hr over 60 Minutes Intravenous  Once 01/02/18 1111 01/02/18 1344   12/31/17 2000  dapsone tablet 100 mg  Status:  Discontinued     100 mg Oral Daily 12/31/17 1859 01/03/18 1619   12/31/17 1000  fluconazole (DIFLUCAN) IVPB 100 mg  Status:  Discontinued     100 mg 50 mL/hr over 60 Minutes Intravenous Every 24 hours 12/30/17 1001 12/30/17 1431   12/31/17 1000  fluconazole (DIFLUCAN) IVPB 400 mg  Status:  Discontinued     400 mg 100 mL/hr over 120 Minutes Intravenous Every 24 hours 12/30/17 1431 01/03/18 1111   12/30/17 1100  fluconazole (DIFLUCAN) IVPB 200 mg     200 mg 100 mL/hr over 60 Minutes Intravenous  Once 12/30/17 1001 12/30/17 1220   12/30/17 1000  bictegravir-emtricitabine-tenofovir AF (BIKTARVY) 50-200-25 MG per tablet 1 tablet     1 tablet Oral Daily 12/30/17 0933        Subjective: Confused A&O x 2.  Can't tell me why she's here.    Objective: Vitals:   01/04/18 1743 01/04/18 2057 01/05/18 0504 01/05/18 0828  BP: 119/72 111/79 105/64 110/77  Pulse: 92 85 88 (!) 107  Resp: _0 Temp: 98.2 F (36.8 C) 98 F (36.7 C) 98.2 F (36.8 C)   TempSrc: Oral Oral Oral   SpO2: 100% 100% 96% 97%  Weight:   51.3 kg  (113 lb 1.5 oz)   Height:        Intake/Output Summary (Last 24 hours) at 01/05/2018 0851 Last data filed at 01/05/2018 0600 Gross per 24 hour  Intake 2573.25 ml  Output -  Net 2573.25 ml   Filed Weights   01/03/18 0655 01/04/18 0509 01/05/18 0504  Weight: 47.8 kg (105 lb 6.1 oz) 51.3 kg (113 lb 1.5 oz) 51.3 kg (113 lb 1.5 oz)    Examination:  General: No acute distress. Cardiovascular: Heart sounds show a regular rate, and rhythm. No gallops or rubs. No murmurs. No JVD. Lungs: Clear to auscultation bilaterally with good air movement. No rales, rhonchi or wheezes. Abdomen: Soft, mildly tender, but improved, nondistended with normal active bowel sounds. No masses. No hepatosplenomegaly. MSK: no midline back pain, no extremity TTP Neurological: Alert and oriented 2. Moves all extremities 4. Cranial nerves II through XII intact.  No asterixis.  Tremor seems better today.  Skin: Warm and dry. No rashes or lesions. Extremities: No clubbing or cyanosis. No edema.   Data Reviewed: I have personally reviewed following labs and imaging studies  CBC: Recent Labs  Lab 12/31/17 0727  01/02/18 0507 01/02/18 1740 01/03/18 0625 01/04/18 0534 01/04/18 0832 01/04/18 2256 01/05/18 0527  WBC 2.1*   < > 1.7* 1.8* 1.9* 2.3*  --   --  2.8*  NEUTROABS 1.8  --   --  1.5* 1.5*  --   --   --   --   HGB 7.4*   < > 7.8* 7.5* 7.9* 6.9* 6.6* 9.5* 7.7*  HCT 23.1*   < > 22.3* 21.6* 23.3* 19.9* 19.1* 27.7* 22.9*  MCV 78.6   < > 76.6* 77.1* 76.4* 77.1*  --   --  74.8*  PLT 30*   < > 23* 23* 24* 24*  --   --  9*   < > = values in this interval not displayed.   Basic Metabolic Panel: Recent Labs  Lab 12/30/17 0700  01/01/18 0534 01/02/18 0507 01/03/18 0625 01/04/18 0534 01/05/18 0527  NA  --    < > 141 132* 128* 132* 135  K  --    < > 3.7 4.3 4.2 3.7 3.5  CL  --    < > 117* 109 103 105 109  CO2  --    < > 19* 18* 19* 23 21*  GLUCOSE  --    < > 86 83 89 80 73  BUN  --    < > 9 <5* 5* 8 10  CREATININE  --    < > 0.46 0.42* 0.37* 0.30* 0.30*  CALCIUM  --    < > 8.4* 8.2* 8.0* 8.0* 8.1*  MG 2.0  --  1.8  --   --   --   --    < > = values in this interval not displayed.   GFR: Estimated Creatinine Clearance: 75.7 mL/min (A) (by C-G formula based on SCr of 0.3 mg/dL (L)). Liver Function Tests: Recent Labs  Lab 01/01/18 0534 01/02/18 0507 01/03/18 0625 01/04/18 0534 01/05/18 0527  AST 99* 149* 213* 236* 234*  ALT 21 24 35 37 40  ALKPHOS 672* 672* 688* 570* 510*  BILITOT 11.5* 11.1* 13.4* 14.8* 16.7*  PROT 5.3* 4.9* 5.1* 4.6* 4.6*  ALBUMIN 1.9* 1.7* 1.8* 1.5* 1.5*   Recent Labs  Lab 12/29/17 1940 12/31/17 0727 01/01/18 0534 01/02/18 0725 01/03/18 0619  LIPASE 1,305* 809* 208* 86* 65*   No results for input(s): AMMONIA in the last 168 hours. Coagulation Profile: Recent Labs  Lab 01/01/18 1522 01/02/18 0507 01/04/18 0534  INR 1.29 1.31 1.38   Cardiac Enzymes: No results for input(s): CKTOTAL, CKMB, CKMBINDEX, TROPONINI in the last 168 hours. BNP (last 3 results) No results for input(s): PROBNP in the last 8760 hours. HbA1C: No results for input(s): HGBA1C in the last 72 hours. CBG: No results for input(s): GLUCAP in the last 168 hours. Lipid Profile: No results for input(s): CHOL, HDL, LDLCALC, TRIG, CHOLHDL, LDLDIRECT in the last 72 hours. Thyroid Function Tests: Recent Labs    01/04/18 1317  TSH 1.770   Anemia Panel: Recent Labs    01/04/18 1317  VITAMINB12 3,087*  FERRITIN 1,933*  TIBC 165*  IRON 23*   Sepsis Labs: No results for input(s): PROCALCITON, LATICACIDVEN in the last 168 hours.  Recent Results (from the past 240 hour(s))  Culture, blood (routine x 2)     Status: None (Preliminary result)   Collection Time: 01/02/18  7:24 AM  Result Value Ref Range Status   Specimen Description BLOOD LEFT HAND  Final   Special Requests IN PEDIATRIC BOTTLE Blood Culture adequate volume  Final   Culture   Final    NO GROWTH 2 DAYS Performed  at [facility], [address] [place], [place] [address]    Report Status PENDING  Incomplete  Culture, blood (routine x 2)     Status: None (Preliminary result)   Collection Time: 01/02/18  7:25 AM  Result Value Ref Range Status   Specimen Description BLOOD RIGHT ANTECUBITAL  Final   Special Requests   Final    BOTTLES DRAWN AEROBIC AND ANAEROBIC Blood Culture adequate volume   Culture   Final    NO GROWTH 2 DAYS Performed at [facility], [place] [address], [place], [place] [address]    Report Status PENDING  Incomplete  Cytomegalovirus (CMV) Culture     Status: None   Collection Time: 01/02/18  8:50 AM  Result Value Ref Range Status   Cytomegalovirus (CMV) Culture Comment  Final    Comment: (NOTE) Preliminary Report: No Cytomegalovirus isolated at 24 hours. Next report to follow after 1 week. Performed At: [facility] [place], [place] [id number] Rush Farmer MD FH:[id number]    Source of Sample ESOPHAGUS  Final    Comment: BIOSPY  Respiratory Panel by PCR     Status: None   Collection Time: 01/02/18 10:42 AM  Result Value Ref Range Status   Adenovirus NOT  DETECTED NOT DETECTED Final   Coronavirus 229E NOT DETECTED NOT DETECTED Final   Coronavirus HKU1 NOT DETECTED NOT DETECTED Final   Coronavirus NL63 NOT DETECTED NOT DETECTED Final   Coronavirus OC43 NOT DETECTED NOT DETECTED Final   Metapneumovirus NOT DETECTED NOT DETECTED Final   Rhinovirus / Enterovirus NOT DETECTED NOT DETECTED Final   Influenza A NOT DETECTED NOT DETECTED Final   Influenza B NOT DETECTED NOT DETECTED Final   Parainfluenza Virus 1 NOT DETECTED NOT DETECTED Final   Parainfluenza Virus 2 NOT DETECTED NOT DETECTED Final   Parainfluenza Virus 3 NOT DETECTED NOT DETECTED Final   Parainfluenza Virus 4 NOT DETECTED NOT DETECTED Final   Respiratory Syncytial Virus NOT DETECTED NOT DETECTED Final   Bordetella pertussis NOT DETECTED NOT DETECTED Final   Chlamydophila  pneumoniae NOT DETECTED NOT DETECTED Final   Mycoplasma pneumoniae NOT DETECTED NOT DETECTED Final  Culture, Urine     Status: Abnormal   Collection Time: 01/02/18 12:41 PM  Result Value Ref Range Status   Specimen Description URINE, CLEAN CATCH  Final   Special Requests NONE  Final   Culture (A)  Final    40,000 COLONIES/mL STAPHYLOCOCCUS SPECIES (COAGULASE NEGATIVE)   Report Status 01/04/2018 FINAL  Final   Organism ID, Bacteria STAPHYLOCOCCUS SPECIES (COAGULASE NEGATIVE) (A)  Final      Susceptibility   Staphylococcus species (coagulase negative) - MIC*    CIPROFLOXACIN <=0.5 SENSITIVE Sensitive     GENTAMICIN <=0.5 SENSITIVE Sensitive     NITROFURANTOIN <=16 SENSITIVE Sensitive     OXACILLIN >=4 RESISTANT Resistant     TETRACYCLINE >=16 RESISTANT Resistant     VANCOMYCIN 1 SENSITIVE Sensitive     TRIMETH/SULFA <=10 SENSITIVE Sensitive     CLINDAMYCIN RESISTANT Resistant     RIFAMPIN <=0.5 SENSITIVE Sensitive     Inducible Clindamycin POSITIVE Resistant     * 40,000 COLONIES/mL STAPHYLOCOCCUS SPECIES (COAGULASE NEGATIVE)         Radiology Studies: No results found.      Scheduled Meds: . benztropine  1 mg Oral Daily  . bictegravir-emtricitabine-tenofovir AF  1 tablet Oral Daily  . feeding supplement  1 Container Oral TID BM  . folic acid  2 mg Oral Daily  . methylPREDNISolone (SOLU-MEDROL) injection  125 mg Intravenous Once  . multivitamin with minerals  1 tablet Oral Daily  . nystatin  5 mL Oral QID  . pantoprazole (PROTONIX) IV  40 mg Intravenous Q12H  . sucralfate  1 g Oral TID WC & HS   Continuous Infusions: . sodium chloride    . sodium chloride    . famotidine (PEPCID) IV    . ferumoxytol    . lactated ringers 75 mL/hr at 01/05/18 0233     LOS: 6 days    Time spent: over 20 min    Fayrene Helper, MD [facility] Pager [phone]  If 7PM-7AM, please contact night-coverage [email or web address] Password [email or web address] 01/05/2018, 8:51 AM

## 2018-01-05 NOTE — Consult Note (Addendum)
[facility] for Infectious Disease    Date of Admission:  12/29/2017   Total days of antibiotics 7        Day 1 anidulafungin           ID: [name] is a 42 y.o. 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)   Bipolar disorder ([place])   Hypokalemia   Esophageal stricture   Dysphagia   Candidal esophagitis ([facility])   Severe protein-calorie malnutrition ([facility])   Abdominal pain    Subjective: Afebrile but platelets low down to 9K. Had 1 dose of solumedrol and platelet transfusion this morning. Denies any epistaxis. She reports falling out of bed this morning, possibly syncopal. nchct negative. Dr Florene Glen mentioned she was confused but now improved after getting lactulose  Still trying to eat solid food but having epigastric pain. Has been spitting up roughly 367m today  Medications:  . bictegravir-emtricitabine-tenofovir AF  1 tablet Oral Daily  . feeding supplement  1 Container Oral TID BM  . folic acid  2 mg Oral Daily  . lactulose  20 g Oral TID  . multivitamin with minerals  1 tablet Oral Daily  . nystatin  5 mL Oral QID  . pantoprazole (PROTONIX) IV  40 mg Intravenous Q12H  . sucralfate  1 g Oral TID WC & HS    Objective: Vital signs in last 24 hours: Temp:  [97.9 F (36.6 C)-98.7 F (37.1 C)] 98.3 F (36.8 C) (01/21 1129) Pulse Rate:  [85-107] 93 (01/21 1129) Resp:  [14-18] 16 (01/21 1129) BP: (105-130)/(62-79) 130/76 (01/21 1129) SpO2:  [96 %-100 %] 98 % (01/21 1129) Weight:  [113 lb 1.5 oz (51.3 kg)] 113 lb 1.5 oz (51.3 kg) (01/21 0504) Physical Exam  Constitutional:  oriented to person, place, and time. appears chronically ill, emaciated. No distress.  HENT: [place]/AT, PERRLA, mild scleral icterus Mouth/Throat: Oropharynx is clear and moist. No oropharyngeal exudate.  Cardiovascular: Normal rate, regular  rhythm and normal heart sounds. Exam reveals no gallop and no friction rub.  No murmur heard.  Pulmonary/Chest: Effort normal and breath sounds normal. No respiratory distress.  has no wheezes.  Neck = supple, no nuchal rigidity Abdominal: Soft. Bowel sounds are normal.  exhibits no distension. Mild tenderness to epigastric Neurological: alert and oriented to person, place, and time.  Skin: Skin is warm and dry. No rash noted. No erythema.  Psychiatric: flat affect  Lab Results Recent Labs    01/04/18 0534  01/04/18 2256 01/05/18 0527  WBC 2.3*  --   --  2.8*  HGB 6.9*   < > 9.5* 7.7*  HCT 19.9*   < > 27.7* 22.9*  NA 132*  --   --  135  K 3.7  --   --  3.5  CL 105  --   --  109  CO2 23  --   --  21*  BUN 8  --   --  10  CREATININE 0.30*  --   --  0.30*   < > = values in this interval not displayed.   Liver Panel Recent Labs    01/04/18 0534 01/05/18 0527  PROT 4.6* 4.6*  ALBUMIN 1.5* 1.5*  AST 236* 234*  ALT 37 40  ALKPHOS 570* 510*  BILITOT 14.8* 16.7*  BILIDIR 9.2* >10.0*  IBILI 5.6* NOT CALCULATED  Microbiology:  Studies/Results: Ct Head Wo Contrast  Result Date: 01/05/2018 CLINICAL DATA:  42 year old female with history of altered level of consciousness. Fall this morning. Possible seizure. EXAM: CT HEAD WITHOUT CONTRAST TECHNIQUE: Contiguous axial images were obtained from the base of the skull through the vertex without intravenous contrast. COMPARISON:  Head CT 03/10/2012. FINDINGS: Brain: No evidence of acute infarction, hemorrhage, hydrocephalus, extra-axial collection or mass lesion/mass effect. Vascular: No hyperdense vessel or unexpected calcification. Skull: Normal. Negative for fracture or focal lesion. Sinuses/Orbits: No acute finding. Other: None. IMPRESSION: 1. No acute intracranial abnormalities. The appearance of the brain is normal. Electronically Signed   By: Vinnie Langton M.D.   On: 01/05/2018 11:01     Assessment/Plan: 42yo F with advanced  hiv disease, poorly controlled, who was admitted for pancreatitis but still has transaminitis despite lipase normalizing. She had repeat EGD that revealed eso candidiasis plus eso stricture. 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   Thrombocytopenia - ddx including ITP = agree with giving solumedrol, watch for active bleeding given <10K, may need IVIG or Nplate if signs of bleeding occur and not responding to steroids. defer to dr Marin Olp to weigh in. She is also getting another plt transfusion but suspect if it is ITP, it may be consumed.This is of new onset during this hospitalization per Dr Abel Presto observation. Her BL PLT are in the 150s in early November. Recommend to continue with steroids for now  - will avoid meds that are associated with thrombocytopenia -- however, some ppi are needed in her case of severe esophagitis  hiv disease = continue with biktarvy. Will recheck VL, previously 4MVL. She refused her biktarvy this morning and 2 days ago, which she claims to prefer taking her biktarvy later in the day. I will change it to give it to her at noon time to see if that helps with adherence.  She did not take it regularly coming into the hospital so I do not think it was the cause of her pancreatitis. Secondly, the components of biktarvy not thought to cause pancreatitis, nor hyperbilirubinemia as we are seeing in her  transaminitis with hyperbilirubinemia = we have stopped agents that could be associated with transaminitis such as fluconazole. Other processes which could do this include disseminated MAC. She has been ruled out in November with bone marrow cx, where she had some transaminitis at that. We will recheck afb blood cx today. I would suspect with CMV hepatitis that this would higher, thus far her AST 234 and ALT 40. She does have increasing Tbili of 16.7 with predominance for direct bili- wonder if this is aids cholangiopathy but it is not  commented upon on her recent mrcp. Continue to follow her cmp with inr since her synthetic function is going out abit with inr of 1.38 yesterday.  Esophagitis = prelim report + candida. will track down path from recent EGD to see if suggestive of + CMV. Will recommend anidulafungin.  oi proph = holding bactrim and dapsone temporarily.  CoNS + Urine cx c/w asymptomatic bacturia = no need to treat   [facility] for Infectious Diseases Cell: [phone] Pager: [phone]  01/05/2018, 12:25 PM

## 2018-01-05 NOTE — Progress Notes (Signed)
   01/05/18 0802  Follow Up  Family notified Yes-comment Christen Butter [place])  Time family notified [phone]  Additional tests Yes-comment (md in and will add orders )  Progress note created (see row info) Yes

## 2018-01-05 NOTE — Progress Notes (Signed)
Unfortunately, Ms. Arel is back in the hospital.  It sounds like she has pancreatitis.  She told me that she has not taken her anti--HIV medications.  She says she last took them back in October.  Her weight is down quite a bit.  She looks cachectic.  Once again, I had believe that her pancytopenia is a multifactorial issue, probably being driven by the uncontrolled HIV.  I just do not know why she does not want to take her HIV medications.  She is iron deficient.  I am sure this is probably from bleeding.  I will give her a dose of IV iron today.  Her platelet count is down to 9000.  I will go ahead and give her a dose of platelet transfusion.  Her hemoglobin is 7.7.  White cell count is 2.8.  Thankfully, her creatinine is doing okay.  It is 0.3. She does have sickle cell trait .  However, I do not think this really is that much of a factor.  Folic acid is essential for her.  I will check her erythropoietin level.  She does have coag negative staph in her urine.  She is being followed by infectious disease for HIV.  I am sure they will monitor the staph in her urine.  What really troubles me is her weight loss.  She clearly is cachectic.  She had a CT of the abdomen and pelvis on 15 January.  It showed the acute pancreatitis.  She had some chronic adenopathy which was stable.  I do not think that there is a need for another bone marrow test.  As I said when I first saw her back in October 2018, her blood counts will be dependent upon her HIV status.  [name], MD  1 Timothy 5:23

## 2018-01-05 NOTE — Progress Notes (Signed)
Nutrition Follow-up  DOCUMENTATION CODES:   Severe malnutrition in context of chronic illness, Underweight  INTERVENTION:  - Continue Boost Breeze BID. - Continue to encourage PO intakes. - RD will continue to monitor for additional nutrition-related needs.   NUTRITION DIAGNOSIS:   Severe Malnutrition related to chronic illness, catabolic illness(HIV/AIDS) as evidenced by severe muscle depletion, severe fat depletion. -ongoing  GOAL:   Patient will meet greater than or equal to 90% of their needs -minimally met  MONITOR:   PO intake, Supplement acceptance, Weight trends, Labs  ASSESSMENT:   42 y.o.female with medical history significant of HIV AIDS, Candida esophagitis, GERD, bipolar disorder, asthma, who comes in for 2 weeks of progressive worsening epigastric abdominal pain. Patient deteriorating for months, losing a significant amount of weight. She reports she is compliant with her HIV medications. She reports that she has been having dysphagia for months. She was given oral Diflucan however she cannot swallow the pills. She called ID office for liquid format which looks like was ordered by ID but patient never received this. She has been able to tolerate pure her food and able to hold that down. She cannot eat solids without meat getting stuck. She does frequently vomit depending on what she eats. No diarrhea. No cough. Her abdominal pain has been getting worse and is worse after eating. Sometimes she goes days without eating. Patient found to have pancreatitis with a lipase level over 1000  Per review, pt consumed 50% of breakfast and dinner yesterday (total: 1001 kcal, 33 grams of protein). Lunch was ordered but intake not documented. Weight trending up since admission and is currently +11 lbs/4.7 kg compared to weight on 12/31/17. She has been refusing Boost supplements the past few days.   She underwent MRCP and findings of pancreatitis, not biliary obstruction noted. Notes have  indicated that pt was not taking/not consistently taking HART medications PTA and may not have been since October 2018. EGD completed with findings of severe esophagitis and esophageal stenosis and will require dilation after biopsy results from EGD received.   Medications reviewed; 510 mg Feraheme x1 today, 2 mg oral folic aicd/day, 20 g lactulose/day, 125 mg Solu-medrol x1 today, daily multivitamin with minerals, 5 mL Mycostatin QID, 1 g [name] TID, 40 mg IV Protonix BID.  Labs reviewed; creatinine: 0.3 mg/dL, Alk Phos elevated, AST elevated, ammonia: 43 umol/L.  IVF: LR @ 75 mL/hr.    Diet Order:  DIET SOFT Room service appropriate? Yes; Fluid consistency: Thin Fall precautions  EDUCATION NEEDS:   No education needs have been identified at this time  Skin:  Skin Assessment: Reviewed RN Assessment  Last BM:  1/20  Height:   Ht Readings from Last 1 Encounters:  12/29/17 6' 1"  (1.854 m)    Weight:   Wt Readings from Last 1 Encounters:  01/05/18 113 lb 1.5 oz (51.3 kg)    Ideal Body Weight:  75 kg  BMI:  Body mass index is 14.92 kg/m.  Estimated Nutritional Needs:   Kcal:  1865-2100 (40-45 kcal/kg)  Protein:  70-80 grams (1.5-1.7 grams/kg)  Fluid:  >/= 2 L/day      Jarome Matin, MS, RD, LDN, [facility] Inpatient Clinical Dietitian Pager # [phone] After hours/weekend pager # [phone]

## 2018-01-06 ENCOUNTER — Inpatient Hospital Stay (HOSPITAL_COMMUNITY)
Admit: 2018-01-06 | Discharge: 2018-01-06 | Disposition: A | Discharge status: Home / Self Care | Payer: Medicaid Other | Attending: Family Medicine | Admitting: Family Medicine

## 2018-01-06 ENCOUNTER — Inpatient Hospital Stay (HOSPITAL_COMMUNITY): Payer: Medicaid Other

## 2018-01-06 DIAGNOSIS — G9340 Encephalopathy, unspecified: Secondary | ICD-10-CM

## 2018-01-06 DIAGNOSIS — E872 Acidosis: Secondary | ICD-10-CM

## 2018-01-06 DIAGNOSIS — K851 Biliary acute pancreatitis without necrosis or infection: Secondary | ICD-10-CM

## 2018-01-06 DIAGNOSIS — K8500 Idiopathic acute pancreatitis without necrosis or infection: Secondary | ICD-10-CM

## 2018-01-06 LAB — BASIC METABOLIC PANEL
ANION GAP: 8 (ref 5–15)
ANION GAP: 9 (ref 5–15)
ANION GAP: 9 (ref 5–15)
Anion gap: 11 (ref 5–15)
Anion gap: 14 (ref 5–15)
BUN: 12 mg/dL (ref 6–20)
BUN: 9 mg/dL (ref 6–20)
BUN: 9 mg/dL (ref 6–20)
BUN: 8 mg/dL (ref 6–20)
BUN: 7 mg/dL (ref 6–20)
CALCIUM: 8 mg/dL — AB (ref 8.9–10.3)
CALCIUM: 8 mg/dL — AB (ref 8.9–10.3)
CALCIUM: 7.8 mg/dL — AB (ref 8.9–10.3)
CALCIUM: 7.8 mg/dL — AB (ref 8.9–10.3)
CO2: 18 mmol/L — AB (ref 22–32)
CO2: 16 mmol/L — AB (ref 22–32)
CO2: 14 mmol/L — ABNORMAL LOW (ref 22–32)
CO2: 15 mmol/L — ABNORMAL LOW (ref 22–32)
CO2: 14 mmol/L — ABNORMAL LOW (ref 22–32)
CREATININE: 0.39 mg/dL — AB (ref 0.44–1.00)
Calcium: 8.1 mg/dL — ABNORMAL LOW (ref 8.9–10.3)
Chloride: 106 mmol/L (ref 101–111)
Chloride: 103 mmol/L (ref 101–111)
Chloride: 102 mmol/L (ref 101–111)
Chloride: 108 mmol/L (ref 101–111)
Chloride: 109 mmol/L (ref 101–111)
Creatinine, Ser: 0.36 mg/dL — ABNORMAL LOW (ref 0.44–1.00)
Creatinine, Ser: <0.3 mg/dL — ABNORMAL LOW (ref 0.44–1.00)
Creatinine, Ser: <0.3 mg/dL — ABNORMAL LOW (ref 0.44–1.00)
Creatinine, Ser: <0.3 mg/dL — ABNORMAL LOW (ref 0.44–1.00)
GFR calc Af Amer: >60 mL/min (ref >60)
GLUCOSE: 376 mg/dL — AB (ref 65–99)
GLUCOSE: 194 mg/dL — AB (ref 65–99)
GLUCOSE: 246 mg/dL — AB (ref 65–99)
Glucose, Bld: 408 mg/dL — ABNORMAL HIGH (ref 65–99)
Glucose, Bld: 337 mg/dL — ABNORMAL HIGH (ref 65–99)
Potassium: 3.4 mmol/L — ABNORMAL LOW (ref 3.5–5.1)
Potassium: 2.9 mmol/L — ABNORMAL LOW (ref 3.5–5.1)
Potassium: 2.8 mmol/L — ABNORMAL LOW (ref 3.5–5.1)
Potassium: 3.2 mmol/L — ABNORMAL LOW (ref 3.5–5.1)
Potassium: 3.4 mmol/L — ABNORMAL LOW (ref 3.5–5.1)
SODIUM: 132 mmol/L — AB (ref 135–145)
Sodium: 132 mmol/L — ABNORMAL LOW (ref 135–145)
Sodium: 130 mmol/L — ABNORMAL LOW (ref 135–145)
Sodium: 130 mmol/L — ABNORMAL LOW (ref 135–145)
Sodium: 132 mmol/L — ABNORMAL LOW (ref 135–145)

## 2018-01-06 LAB — BETA-HYDROXYBUTYRIC ACID: BETA-HYDROXYBUTYRIC ACID: 0.06 mmol/L (ref 0.05–0.27)

## 2018-01-06 LAB — BLOOD GAS, ARTERIAL
Acid-base deficit: 9.7 mmol/L — ABNORMAL HIGH (ref 0.0–2.0)
BICARBONATE: 14.3 mmol/L — AB (ref 20.0–28.0)
FIO2: 21
O2 Saturation: 91.4 %
PH ART: 7.365 (ref 7.350–7.450)
PO2 ART: 69.9 mmHg — AB (ref 83.0–108.0)
Patient temperature: 37
pCO2 arterial: 25.6 mmHg — ABNORMAL LOW (ref 32.0–48.0)

## 2018-01-06 LAB — CBC WITH DIFFERENTIAL/PLATELET
Basophils Absolute: 0 10*3/uL (ref 0.0–0.1)
Basophils Relative: 1 %
EOS ABS: 0 10*3/uL (ref 0.0–0.7)
Eosinophils Relative: 0 %
HEMATOCRIT: 22.1 % — AB (ref 36.0–46.0)
Hemoglobin: 7.6 g/dL — ABNORMAL LOW (ref 12.0–15.0)
LYMPHS ABS: 0.1 10*3/uL (ref 0.7–4.0)
Lymphocytes Relative: 5 %
MCH: 26.1 pg (ref 26.0–34.0)
MCHC: 34.4 g/dL (ref 30.0–36.0)
MCV: 75.9 fL — ABNORMAL LOW (ref 78.0–100.0)
Monocytes Absolute: 0.4 10*3/uL (ref 0.1–1.0)
Monocytes Relative: 13 %
Neutro Abs: 2.4 10*3/uL (ref 1.7–7.7)
Neutrophils Relative %: 81 %
PLATELETS: 19 10*3/uL — AB (ref 150–400)
RBC: 2.91 MIL/uL — ABNORMAL LOW (ref 3.87–5.11)
RDW: 22.5 % — AB (ref 11.5–15.5)
WBC: 2.9 10*3/uL — AB (ref 4.0–10.5)

## 2018-01-06 LAB — MAGNESIUM: Magnesium: 1.6 mg/dL — ABNORMAL LOW (ref 1.7–2.4)

## 2018-01-06 LAB — URINALYSIS, ROUTINE W REFLEX MICROSCOPIC
Glucose, UA: >=500 mg/dL — AB
Ketones, ur: NEGATIVE mg/dL
LEUKOCYTES UA: NEGATIVE
Nitrite: NEGATIVE
PROTEIN: NEGATIVE mg/dL
Specific Gravity, Urine: 1.017 (ref 1.005–1.030)
pH: 6 (ref 5.0–8.0)

## 2018-01-06 LAB — GLUCOSE, CAPILLARY
GLUCOSE-CAPILLARY: 150 mg/dL — AB (ref 65–99)
Glucose-Capillary: 349 mg/dL — ABNORMAL HIGH (ref 65–99)
Glucose-Capillary: 286 mg/dL — ABNORMAL HIGH (ref 65–99)
Glucose-Capillary: 204 mg/dL — ABNORMAL HIGH (ref 65–99)
Glucose-Capillary: 131 mg/dL — ABNORMAL HIGH (ref 65–99)

## 2018-01-06 LAB — HEPATIC FUNCTION PANEL
ALBUMIN: 1.5 g/dL — AB (ref 3.5–5.0)
ALT: 45 U/L (ref 14–54)
AST: 205 U/L — ABNORMAL HIGH (ref 15–41)
Alkaline Phosphatase: 515 U/L — ABNORMAL HIGH (ref 38–126)
BILIRUBIN TOTAL: 17 mg/dL — AB (ref 0.3–1.2)
Bilirubin, Direct: 12 mg/dL — ABNORMAL HIGH (ref 0.1–0.5)
Indirect Bilirubin: 6.7 mg/dL — ABNORMAL HIGH (ref 0.3–0.9)
TOTAL PROTEIN: 4.5 g/dL — AB (ref 6.5–8.1)

## 2018-01-06 LAB — RETICULOCYTES
RBC.: 2.86 MIL/uL — ABNORMAL LOW (ref 3.87–5.11)
RETIC CT PCT: 2.1 % (ref 0.4–3.1)
Retic Count, Absolute: 60.1 10*3/uL (ref 19.0–186.0)

## 2018-01-06 LAB — PREPARE PLATELET PHERESIS: Unit division: 0

## 2018-01-06 LAB — BPAM PLATELET PHERESIS
BLOOD PRODUCT EXPIRATION DATE: 201901212359
ISSUE DATE / TIME: 201901210943
UNIT TYPE AND RH: 600

## 2018-01-06 LAB — T-HELPER CELLS (CD4) COUNT (NOT AT ARMC)
CD4 T CELL ABS: 8 /uL — AB (ref 400–2700)
CD4 T CELL HELPER: 8 % — AB (ref 33–55)

## 2018-01-06 LAB — MRSA PCR SCREENING: MRSA BY PCR: NEGATIVE

## 2018-01-06 LAB — PROTIME-INR
INR: 2.02
Prothrombin Time: 22.7 seconds — ABNORMAL HIGH (ref 11.4–15.2)

## 2018-01-06 LAB — LACTIC ACID, PLASMA: LACTIC ACID, VENOUS: 7.4 mmol/L — AB (ref 0.5–1.9)

## 2018-01-06 MED ORDER — SODIUM CHLORIDE 0.9 % IV SOLN
INTRAVENOUS | Status: DC
  Administered 2018-01-06 – 2018-01-10 (×7): via INTRAVENOUS

## 2018-01-06 MED ORDER — INSULIN REGULAR HUMAN 100 UNIT/ML IJ SOLN
INTRAMUSCULAR | Status: DC
  Administered 2018-01-06: 1.4 [IU]/h via INTRAVENOUS
  Filled 2018-01-06: qty 1

## 2018-01-06 MED ORDER — POTASSIUM CHLORIDE 10 MEQ/100ML IV SOLN
10.0000 meq | INTRAVENOUS | Status: AC
  Administered 2018-01-06 (×2): 10 meq via INTRAVENOUS
  Filled 2018-01-06 (×3): qty 100

## 2018-01-06 MED ORDER — MAGNESIUM SULFATE 2 GM/50ML IV SOLN
2.0000 g | Freq: Once | INTRAVENOUS | Status: AC
  Administered 2018-01-06: 2 g via INTRAVENOUS
  Filled 2018-01-06: qty 50

## 2018-01-06 MED ORDER — POTASSIUM CHLORIDE 10 MEQ/100ML IV SOLN
10.0000 meq | INTRAVENOUS | Status: AC
  Administered 2018-01-06 (×3): 10 meq via INTRAVENOUS
  Filled 2018-01-06 (×6): qty 100

## 2018-01-06 MED ORDER — ETHAMBUTOL HCL 400 MG PO TABS
15.0000 mg/kg | ORAL_TABLET | Freq: Every day | ORAL | Status: DC
  Administered 2018-01-07: 11:00:00 850 mg via ORAL
  Filled 2018-01-06 (×4): qty 0.5

## 2018-01-06 MED ORDER — DEXTROSE-NACL 5-0.45 % IV SOLN: INTRAVENOUS | Status: DC

## 2018-01-06 MED ORDER — DEXAMETHASONE 4 MG PO TABS
40.0000 mg | ORAL_TABLET | Freq: Every day | ORAL | Status: DC
  Administered 2018-01-06 – 2018-01-13 (×8): 40 mg via ORAL
  Filled 2018-01-06 (×2): qty 1
  Filled 2018-01-06 (×7): qty 10

## 2018-01-06 MED ORDER — INSULIN ASPART 100 UNIT/ML ~~LOC~~ SOLN: 0.0000 [IU] | Freq: Three times a day (TID) | SUBCUTANEOUS | Status: DC

## 2018-01-06 MED ORDER — LACTATED RINGERS IV BOLUS (SEPSIS)
1000.0000 mL | Freq: Once | INTRAVENOUS | Status: AC
  Administered 2018-01-06: 1000 mL via INTRAVENOUS

## 2018-01-06 MED ORDER — DEXTROSE 5 % IV SOLN
500.0000 mg | INTRAVENOUS | Status: DC
  Administered 2018-01-06 – 2018-01-07 (×2): 500 mg via INTRAVENOUS
  Filled 2018-01-06 (×3): qty 500

## 2018-01-06 MED ORDER — SODIUM CHLORIDE 0.9 % IV BOLUS (SEPSIS)
2000.0000 mL | Freq: Once | INTRAVENOUS | Status: AC
  Administered 2018-01-06: 2000 mL via INTRAVENOUS

## 2018-01-06 MED ORDER — POTASSIUM CHLORIDE CRYS ER 20 MEQ PO TBCR
30.0000 meq | EXTENDED_RELEASE_TABLET | Freq: Once | ORAL | Status: AC
  Administered 2018-01-07: 30 meq via ORAL
  Filled 2018-01-06: qty 1

## 2018-01-06 MED ORDER — IOPAMIDOL (ISOVUE-300) INJECTION 61%: 30.0000 mL | Freq: Once | INTRAVENOUS | Status: DC

## 2018-01-06 MED ORDER — IOPAMIDOL (ISOVUE-300) INJECTION 61%
INTRAVENOUS | Status: AC
  Filled 2018-01-06: qty 30

## 2018-01-06 MED ORDER — INSULIN ASPART 100 UNIT/ML ~~LOC~~ SOLN
0.0000 [IU] | SUBCUTANEOUS | Status: DC
  Administered 2018-01-06: 3 [IU] via SUBCUTANEOUS

## 2018-01-06 MED ORDER — INSULIN ASPART 100 UNIT/ML ~~LOC~~ SOLN
0.0000 [IU] | Freq: Three times a day (TID) | SUBCUTANEOUS | Status: DC
  Administered 2018-01-07: 3 [IU] via SUBCUTANEOUS
  Administered 2018-01-07: 1 [IU] via SUBCUTANEOUS
  Administered 2018-01-08 (×2): 2 [IU] via SUBCUTANEOUS
  Administered 2018-01-08 – 2018-01-09 (×2): 1 [IU] via SUBCUTANEOUS
  Administered 2018-01-09: 3 [IU] via SUBCUTANEOUS
  Administered 2018-01-09: 2 [IU] via SUBCUTANEOUS
  Administered 2018-01-10: 1 [IU] via SUBCUTANEOUS
  Administered 2018-01-11 (×2): 2 [IU] via SUBCUTANEOUS
  Administered 2018-01-13: 1 [IU] via SUBCUTANEOUS
  Administered 2018-01-13: 2 [IU] via SUBCUTANEOUS
  Administered 2018-01-15: 3 [IU] via SUBCUTANEOUS
  Administered 2018-01-16: 2 [IU] via SUBCUTANEOUS
  Administered 2018-01-16 (×2): 1 [IU] via SUBCUTANEOUS
  Administered 2018-01-17 (×2): 2 [IU] via SUBCUTANEOUS
  Administered 2018-01-18 – 2018-01-19 (×3): 1 [IU] via SUBCUTANEOUS

## 2018-01-06 MED ORDER — INSULIN ASPART 100 UNIT/ML ~~LOC~~ SOLN
0.0000 [IU] | Freq: Every day | SUBCUTANEOUS | Status: DC
Start: 2018-01-06 — End: 2018-01-06

## 2018-01-06 MED ORDER — INSULIN ASPART 100 UNIT/ML ~~LOC~~ SOLN
0.0000 [IU] | Freq: Three times a day (TID) | SUBCUTANEOUS | Status: DC
  Administered 2018-01-06: 11 [IU] via SUBCUTANEOUS
  Administered 2018-01-06: 8 [IU] via SUBCUTANEOUS

## 2018-01-06 MED ORDER — SODIUM CHLORIDE 0.9 % IV SOLN
INTRAVENOUS | Status: AC
  Administered 2018-01-06: 18:00:00 via INTRAVENOUS

## 2018-01-06 MED ORDER — INSULIN ASPART 100 UNIT/ML ~~LOC~~ SOLN: 0.0000 [IU] | Freq: Every day | SUBCUTANEOUS | Status: DC

## 2018-01-06 MED ORDER — POTASSIUM CHLORIDE 20 MEQ PO PACK: 40.0000 meq | PACK | Freq: Once | ORAL | Status: DC

## 2018-01-06 MED ORDER — POTASSIUM CHLORIDE CRYS ER 20 MEQ PO TBCR
20.0000 meq | EXTENDED_RELEASE_TABLET | Freq: Once | ORAL | Status: AC
  Administered 2018-01-06: 20 meq via ORAL
  Filled 2018-01-06: qty 1

## 2018-01-06 MED ORDER — POTASSIUM CHLORIDE 20 MEQ/15ML (10%) PO SOLN
40.0000 meq | Freq: Once | ORAL | Status: DC
  Filled 2018-01-06: qty 30

## 2018-01-06 MED ORDER — IOPAMIDOL (ISOVUE-300) INJECTION 61%
INTRAVENOUS | Status: AC
  Administered 2018-01-06: 100 mL
  Filled 2018-01-06: qty 100

## 2018-01-06 NOTE — Progress Notes (Signed)
PT Cancellation Note  Patient Details Name: [name] MRN: [id number] DOB: [birth year]   Cancelled Treatment:    Reason Eval/Treat Not Completed: Pain limiting ability to participate, reports getting up to [facility]. Requesting pain medication. Will return tomorrow.    Claretha Cooper 01/06/2018, 4:37 PM Tresa Endo PT [phone]

## 2018-01-06 NOTE — Progress Notes (Addendum)
PROGRESS NOTE    Blue I Priest  MRN:[id number] DOB: 01/30/1976 DOA: 12/29/2017 PCP: Avbuere, Edwin, MD   Brief Narrative:  [name] is a 42 y.o. female with medical history significant of HIV AIDS, Candida esophagitis, GERD, bipolar disorder, asthma, who comes in for 2 weeks of progressive worsening epigastric abdominal pain.  She had lab and imaging findings c/w pancreatitis.   Assessment & Plan:   Principal Problem:   Pancreatitis Active Problems:   HIV disease (HCC)   Bipolar disorder (HCC)   Hypokalemia   Esophageal stricture   Dysphagia   Candidal esophagitis (HCC)   Severe protein-calorie malnutrition (HCC)   Abdominal pain  Addendum Anion Gap Metabolic Acidosis  Lactic Acidosis:  Progressively worsening AGMA throughout the day.  Needs to be corrected for albumin.  Arterial blood gas notable for normal pH, but respiratory alkalosis and metabolic acidosis.  With hyperglycemia, pt started on insulin gtt in case of contribution of DKA.  Lactate is 7, so this is likely etiology.  Pt being transferred to stepdown for closer monitoring.  Biktarvy being held in case of contribution for lactic acidosis Bolus and maintenance IVF Insulin gtt in case of contribution from hyperglycemia and possible DKA Follow urinalysis and beta hydroxybutyrate -> urine without ketones and negative beta hydroxybutyrate, will stop gtt and order q4 SSI overnight Repeat CT abd/pelvis with worsening distension today (seemed to progress on my reevaluation) PCCM consulted, appreciate recommendations   Acute Encephalopathy:  Seems to be resolved at this point.  A&Ox3 now.  May have been some confusion after syncopal event vs post ictal after seizure?  There was some concern for hepatic encephalopathy yesterday, but with transient nature, suspect this is less likely, but will continue lactulose with worsening liver function tests.  Morphine has been discontinued, receiving low dose oxy for pain.   Benztropine d/c'd as well.   Ammonia mildly elevated Head CT without acute intracranial abnormality EEG pending VBG with normal pH, low CO2 Continue lactulose for now  Fall: pt found down 1/21 by nursing.  She was confused when I evaluated her, unable to say what happened or why she was here (as above), but improved now. No obvious injuries on my exam.  Moving all extremities.  Head CT as above.  Ddx includes most likely syncopal event.  EEG pending given confusion afterwards. Follow orthostatics   Fall precautions.   Elevated Liver Enzymes: Worsening synthetic function.  Bili and INR continuing to rise.  AST slightly improved.  Alk phos relatively stable.  Had some confusion yesterday after the fall, but this has resolved.  MRCP without obstruction.  Acute hepatitis panel negative.  CMV positive (but low quant).  Thought to be medication related vs infectious.  [ ] Will plan for liver biopsy given worsening liver function tests (will discuss plan with IR) [ ] follow chronic liver disease w/u per GI - ASMA, AMA, hep C antibody, Hep B core antibody, A1AT [ ] AFB blood cx pending for MAC - treating empirically for this per ID Fluconazole d/c'd Olanzapine d/c'd.  Dapsone d/c'd.   Pancreatitis: Unclear etiology.  elevated bili and alk phos concerning for pancreatitis related to stone despite hx of cholecystectomy, but MRCP without biliary obstruction.   Discussed antiretrovirals as possible etiology with ID, but thought less likely.   Dapsone occasionally can cause pancreatitis and cholestasis, will hold for now (discussed with ID, consider challenging again at later point).   Will stop olanzapine as it is also associated with hepatitis   and pancreatitis. Of note, she says the only medicines she was consistently taking prior to her admission were dapsone and cogentin (she hadn't taken the biktarvy in a few weeks and she was taking the olanzapine only every once in a while when she was having  trouble sleeping). Abdominal pain seems to be improving.   Triglycerides 400. Lipase improving.    Advance to mech soft diet - ADAT Antiemetics, analgesics IVF MRCP with findings c/w pancreatitis, but no biliary obstruction GI c/s, appreciate recs  Fever:  Isolated.  Pt appears stable.   CXR with possible interstitial edema, normal BNP.   RVP negative Ucx coag neg staph, suspect contaminant as UA clean and pt without sx of UTI Bcx NGTD x3 days AFB Bcx pending Abx d/c'd.  Continue to monitor.  Low threshold to reimage with pancreatitis above.   Esophagitis  Dysphagia Esophageal Stricture  Weight Loss: EGD with severe esophagitis and esophageal stenosis, appeared more like reflux.  IV PPI BID.  Needs dilation after biopsies result and platelets improved.  [name] added.  Biopsies with "inflamed granulation tissue and numerous fungal forms c/w candida" Unable to stain effectively for HSV as no squamous mucosa [ ] CMV immunohistochemistry pending (will be added as addendum to 1/21 bx results) Start eraxis given extensive candida on biopsy (fluconazole d/c'd) CMV cx NGTD [ ] follow surgical path for updates Dietician to follow  HIV  AIDS:  ID c/s, appreciate recs Continue antiretrovirals [ ] repeat CD4 count and HIV RNA quant pending D/c dapsone as can cause cholestasis and pancreatitis (consider rechallenging at later point) (pt allergic to bactrim)   Hematemesis:  Suspect this was related to severe esophagitis as noted above.  PPI BID and carafate  Anemia: suspect degree of dilution with IVF with pancreatitis, but also likely component of ABLA with esophagitis. Transfuse for <7.5 per heme Labs suggestive of iron deficiency and AOCD with elevated ferritin.  Elevated B12.  Folate pending.  Feraheme 1/21 per heme [ ] hemoccult  Positive (no melena or hematochezia) S/p 2 unit pRBC during this admission (1/16 and 1/20)  Thrombocytopenia: this has worsened since 2018.  Likely  related to HIV.  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  Likely worsened in setting of pancreatitis and hepatitis.  Improved today, discussed with Dr. Ennever, will start dexamethasone 40 mg daily Transfuse for bleeding or platelets <10K 1 unit platelets 1/18 prior to EGD 1 unit platelets today 1/21 S/p solumedrol x1 on 1/21 Appreciate heme recs  Pancytopenia:  Likely related to uncontrolled HIV.  Normal fibrinogen, normal INR, slightly elevated PTT, elevated LDH Retics, hypoprolif [ ] parvovirus sent [ ] blood cx for MAC as noted above [ ] follow folate, pending  Appreciate heme recs (consider IVIG vs Nplate vs repeat bx?)  Hyperglycemia: likely 2/2 steroids, will start SSI.  Will likely need long acting with this degree of hyperglycemia, but continue to monitor repeat BG's throughout day first.  Bipolar disorder: Holding olanzapine and cogentin.  Hypokalemia: f/u mg, replete prn.  K in IVF.   Severe Protein Calorie malnutrition: dietician  Tremor: Check B12, TSH (wnl).  More with movement.  Started around 1/20.  Will continue to monitor.  No focal deficits. Seems a bit improved.   Hyponatremia: improving with IVF, continue to monitor.   DVT prophylaxis: SCD Code Status: full code Family Communication: none at bedside Disposition Plan: pending  Consultants:   ID and GI  Procedures:  EGD 1/18 Impression: Esophageal stenosis. Severe esophagitis. Biopsied. Moderate Sedation: -   Clear liquid diet. - Continue present medications. - Use Protonix (pantoprazole) 40 mg IV daily BID. Intensify PPI therapy for a few days, and elevate head of bed. Check biopsies. Will need repeat EGD with dilation after biopsies are checked and inflammation calms down and we try and make sure platlets are above 50,000.  Antimicrobials:  Anti-infectives (From admission, onward)   Start     Dose/Rate Route Frequency Ordered Stop   01/06/18 1200  bictegravir-emtricitabine-tenofovir AF (BIKTARVY) 50-200-25  MG per tablet 1 tablet     1 tablet Oral Daily 01/05/18 1626     01/05/18 1500  anidulafungin (ERAXIS) 200 mg in sodium chloride 0.9 % 200 mL IVPB     200 mg 78 mL/hr over 200 Minutes Intravenous Every 24 hours 01/05/18 1331     01/02/18 2200  vancomycin (VANCOCIN) 500 mg in sodium chloride 0.9 % 100 mL IVPB  Status:  Discontinued     500 mg 100 mL/hr over 60 Minutes Intravenous Every 12 hours 01/02/18 1111 01/02/18 1559   01/02/18 1400  ceFEPIme (MAXIPIME) 1 g in dextrose 5 % 50 mL IVPB  Status:  Discontinued     1 g 100 mL/hr over 30 Minutes Intravenous Every 8 hours 01/02/18 1111 01/02/18 1559   01/02/18 1200  vancomycin (VANCOCIN) IVPB 1000 mg/200 mL premix     1,000 mg 200 mL/hr over 60 Minutes Intravenous  Once 01/02/18 1111 01/02/18 1344   12/31/17 2000  dapsone tablet 100 mg  Status:  Discontinued     100 mg Oral Daily 12/31/17 1859 01/03/18 1619   12/31/17 1000  fluconazole (DIFLUCAN) IVPB 100 mg  Status:  Discontinued     100 mg 50 mL/hr over 60 Minutes Intravenous Every 24 hours 12/30/17 1001 12/30/17 1431   12/31/17 1000  fluconazole (DIFLUCAN) IVPB 400 mg  Status:  Discontinued     400 mg 100 mL/hr over 120 Minutes Intravenous Every 24 hours 12/30/17 1431 01/03/18 1111   12/30/17 1100  fluconazole (DIFLUCAN) IVPB 200 mg     200 mg 100 mL/hr over 60 Minutes Intravenous  Once 12/30/17 1001 12/30/17 1220   12/30/17 1000  bictegravir-emtricitabine-tenofovir AF (BIKTARVY) 50-200-25 MG per tablet 1 tablet  Status:  Discontinued     1 tablet Oral Daily 12/30/17 0933 01/05/18 1544      Subjective: Persistent abdominal pain. A&Ox4.  Objective: Vitals:   01/05/18 1129 01/05/18 1448 01/05/18 2228 01/06/18 0659  BP: 130/76 121/79 116/79 129/82  Pulse: 93 84 94 86  Resp: _0 Temp: 98.3 F (36.8 C) 98 F (36.7 C) 98 F (36.7 C) 98.2 F (36.8 C)  TempSrc: Oral Oral Oral Oral  SpO2: 98% 96% 93% 100%  Weight:    55.2 kg (121 lb 11.1 oz)  Height:         Intake/Output Summary (Last 24 hours) at 01/06/2018 1039 Last data filed at 01/05/2018 1558 Gross per 24 hour  Intake 1429.5 ml  Output 500 ml  Net 929.5 ml   Filed Weights   01/04/18 0509 01/05/18 0504 01/06/18 0659  Weight: 51.3 kg (113 lb 1.5 oz) 51.3 kg (113 lb 1.5 oz) 55.2 kg (121 lb 11.1 oz)    Examination:  General: No acute distress. Cardiovascular: Heart sounds show a regular rate, and rhythm. No gallops or rubs. No murmurs. No JVD. Lungs: Clear to auscultation bilaterally with good air movement. No rales, rhonchi or wheezes. Abdomen: Soft, mildly distended.  TTP most in upper quadrants, no rebound  or guarding.  Seems less than yesterday. Neurological: Alert and oriented 3. Moves all extremities 4. Cranial nerves II through XII grossly intact. Skin: Warm and dry. No rashes or lesions. Extremities: No clubbing or cyanosis. No edema.  Psychiatric: Mood and affect are normal. Insight and judgment are appropriate.  Data Reviewed: I have personally reviewed following labs and imaging studies  CBC: Recent Labs  Lab 12/31/17 0727  01/02/18 1740 01/03/18 0625 01/04/18 0534 01/04/18 0832 01/04/18 1317 01/04/18 2256 01/05/18 0527 01/05/18 1316 01/06/18 0557  WBC 2.1*   < > 1.8* 1.9* 2.3*  --   --   --  2.8* 3.1* 2.9*  NEUTROABS 1.8  --  1.5* 1.5*  --   --   --   --   --   --  2.4  HGB 7.4*   < > 7.5* 7.9* 6.9* 6.6*  --  9.5* 7.7* 8.0* 7.6*  HCT 23.1*   < > 21.6* 23.3* 19.9* 19.1* 18.7* 27.7* 22.9* 23.8* 22.1*  MCV 78.6   < > 77.1* 76.4* 77.1*  --   --   --  74.8* 74.8* 75.9*  PLT 30*   < > 23* 24* 24*  --   --   --  9* 15* 19*   < > = values in this interval not displayed.   Basic Metabolic Panel: Recent Labs  Lab 01/01/18 0534 01/02/18 0507 01/03/18 0625 01/04/18 0534 01/05/18 0527 01/06/18 0557  NA 141 132* 128* 132* 135 132*  K 3.7 4.3 4.2 3.7 3.5 3.4*  CL 117* 109 103 105 109 106  CO2 19* 18* 19* 23 21* 18*  GLUCOSE 86 83 89 80 73 408*  BUN 9 <5*  5* 8 10 12  CREATININE 0.46 0.42* 0.37* 0.30* 0.30* 0.36*  CALCIUM 8.4* 8.2* 8.0* 8.0* 8.1* 8.0*  MG 1.8  --   --   --   --   --    GFR: Estimated Creatinine Clearance: 81.5 mL/min (A) (by C-G formula based on SCr of 0.36 mg/dL (L)). Liver Function Tests: Recent Labs  Lab 01/02/18 0507 01/03/18 0625 01/04/18 0534 01/05/18 0527 01/06/18 0557  AST 149* 213* 236* 234* 205*  ALT 24 35 37 40 45  ALKPHOS 672* 688* 570* 510* 515*  BILITOT 11.1* 13.4* 14.8* 16.7* 17.0*  PROT 4.9* 5.1* 4.6* 4.6* 4.5*  ALBUMIN 1.7* 1.8* 1.5* 1.5* 1.5*   Recent Labs  Lab 12/31/17 0727 01/01/18 0534 01/02/18 0725 01/03/18 0619  LIPASE 809* 208* 86* 65*   Recent Labs  Lab 01/05/18 0901  AMMONIA 43*   Coagulation Profile: Recent Labs  Lab 01/01/18 1522 01/02/18 0507 01/04/18 0534 01/06/18 0557  INR 1.29 1.31 1.38 2.02   Cardiac Enzymes: No results for input(s): CKTOTAL, CKMB, CKMBINDEX, TROPONINI in the last 168 hours. BNP (last 3 results) No results for input(s): PROBNP in the last 8760 hours. HbA1C: No results for input(s): HGBA1C in the last 72 hours. CBG: No results for input(s): GLUCAP in the last 168 hours. Lipid Profile: No results for input(s): CHOL, HDL, LDLCALC, TRIG, CHOLHDL, LDLDIRECT in the last 72 hours. Thyroid Function Tests: Recent Labs    01/04/18 1317  TSH 1.770   Anemia Panel: Recent Labs    01/04/18 1317 01/06/18 0557  VITAMINB12 3,087*  --   FERRITIN 1,933*  --   TIBC 165*  --   IRON 23*  --   RETICCTPCT  --  2.1   Sepsis Labs: No results for input(s): PROCALCITON, LATICACIDVEN in the   last 168 hours.  Recent Results (from the past 240 hour(s))  Culture, blood (routine x 2)     Status: None (Preliminary result)   Collection Time: 01/02/18  7:24 AM  Result Value Ref Range Status   Specimen Description BLOOD LEFT HAND  Final   Special Requests IN PEDIATRIC BOTTLE Blood Culture adequate volume  Final   Culture   Final    NO GROWTH 3 DAYS Performed  at [facility], [address] [place], [place] [address]    Report Status PENDING  Incomplete  Culture, blood (routine x 2)     Status: None (Preliminary result)   Collection Time: 01/02/18  7:25 AM  Result Value Ref Range Status   Specimen Description BLOOD RIGHT ANTECUBITAL  Final   Special Requests   Final    BOTTLES DRAWN AEROBIC AND ANAEROBIC Blood Culture adequate volume   Culture   Final    NO GROWTH 3 DAYS Performed at [facility], [address] [place], [place] [address]    Report Status PENDING  Incomplete  Cytomegalovirus (CMV) Culture     Status: None   Collection Time: 01/02/18  8:50 AM  Result Value Ref Range Status   Cytomegalovirus (CMV) Culture Comment  Final    Comment: (NOTE) Preliminary Report: No Cytomegalovirus isolated at 24 hours. Next report to follow after 1 week. Performed At: [facility] [address] [place],  [id number] Nagendra Sanjai MD Ph:[phone]    Source of Sample ESOPHAGUS  Final    Comment: BIOSPY  Respiratory Panel by PCR     Status: None   Collection Time: 01/02/18 10:42 AM  Result Value Ref Range Status   Adenovirus NOT DETECTED NOT DETECTED Final   Coronavirus 229E NOT DETECTED NOT DETECTED Final   Coronavirus HKU1 NOT DETECTED NOT DETECTED Final   Coronavirus NL63 NOT DETECTED NOT DETECTED Final   Coronavirus OC43 NOT DETECTED NOT DETECTED Final   Metapneumovirus NOT DETECTED NOT DETECTED Final   Rhinovirus / Enterovirus NOT DETECTED NOT DETECTED Final   Influenza A NOT DETECTED NOT DETECTED Final   Influenza B NOT DETECTED NOT DETECTED Final   Parainfluenza Virus 1 NOT DETECTED NOT DETECTED Final   Parainfluenza Virus 2 NOT DETECTED NOT DETECTED Final   Parainfluenza Virus 3 NOT DETECTED NOT DETECTED Final   Parainfluenza Virus 4 NOT DETECTED NOT DETECTED Final   Respiratory Syncytial Virus NOT DETECTED NOT DETECTED Final   Bordetella pertussis NOT DETECTED NOT DETECTED Final   Chlamydophila  pneumoniae NOT DETECTED NOT DETECTED Final   Mycoplasma pneumoniae NOT DETECTED NOT DETECTED Final  Culture, Urine     Status: Abnormal   Collection Time: 01/02/18 12:41 PM  Result Value Ref Range Status   Specimen Description URINE, CLEAN CATCH  Final   Special Requests NONE  Final   Culture (A)  Final    40,000 COLONIES/mL STAPHYLOCOCCUS SPECIES (COAGULASE NEGATIVE)   Report Status 01/04/2018 FINAL  Final   Organism ID, Bacteria STAPHYLOCOCCUS SPECIES (COAGULASE NEGATIVE) (A)  Final      Susceptibility   Staphylococcus species (coagulase negative) - MIC*    CIPROFLOXACIN <=0.5 SENSITIVE Sensitive     GENTAMICIN <=0.5 SENSITIVE Sensitive     NITROFURANTOIN <=16 SENSITIVE Sensitive     OXACILLIN >=4 RESISTANT Resistant     TETRACYCLINE >=16 RESISTANT Resistant     VANCOMYCIN 1 SENSITIVE Sensitive     TRIMETH/SULFA <=10 SENSITIVE Sensitive     CLINDAMYCIN RESISTANT Resistant       RIFAMPIN <=0.5 SENSITIVE Sensitive     Inducible Clindamycin POSITIVE Resistant     * 40,000 COLONIES/mL STAPHYLOCOCCUS SPECIES (COAGULASE NEGATIVE)         Radiology Studies: Ct Head Wo Contrast  Result Date: 01/05/2018 CLINICAL DATA:  42 year old female with history of altered level of consciousness. Fall this morning. Possible seizure. EXAM: CT HEAD WITHOUT CONTRAST TECHNIQUE: Contiguous axial images were obtained from the base of the skull through the vertex without intravenous contrast. COMPARISON:  Head CT 03/10/2012. FINDINGS: Brain: No evidence of acute infarction, hemorrhage, hydrocephalus, extra-axial collection or mass lesion/mass effect. Vascular: No hyperdense vessel or unexpected calcification. Skull: Normal. Negative for fracture or focal lesion. Sinuses/Orbits: No acute finding. Other: None. IMPRESSION: 1. No acute intracranial abnormalities. The appearance of the brain is normal. Electronically Signed   By: Vinnie Langton M.D.   On: 01/05/2018 11:01        Scheduled Meds: .  bictegravir-emtricitabine-tenofovir AF  1 tablet Oral Q1200  . feeding supplement  1 Container Oral TID BM  . folic acid  2 mg Oral Daily  . insulin aspart  0-15 Units Subcutaneous TID WC  . insulin aspart  0-5 Units Subcutaneous QHS  . multivitamin with minerals  1 tablet Oral Daily  . nystatin  5 mL Oral QID  . pantoprazole (PROTONIX) IV  40 mg Intravenous Q12H  . potassium chloride  20 mEq Oral Once  . sucralfate  1 g Oral TID WC & HS   Continuous Infusions: . anidulafungin Stopped (01/05/18 1820)  . lactated ringers 75 mL/hr at 01/06/18 0718     LOS: 7 days    Time spent: over 20 min    Fayrene Helper, MD [facility] Pager [phone]  If 7PM-7AM, please contact night-coverage [email or web address] Password [facility] 01/06/2018, 10:39 AM

## 2018-01-06 NOTE — Consult Note (Addendum)
[facility] for Infectious Disease    Date of Admission:  12/29/2017   Total days of antibiotics 8        Day 2 anidulafungin           ID: [name] is a 42 y.o. 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 She is still spitting up saliva/partial food. Looks fatigued. Underwent EEG today  24hr events - has numerous lab abn -- Tbili is worsening at 17 and Dbili 12. AST slighly improved at 205. Glu 408 (though she received solumedrol yesterday). INR 2. Bicarb 18  Medications:  . bictegravir-emtricitabine-tenofovir AF  1 tablet Oral Q1200  . dexamethasone  40 mg Oral Daily  . feeding supplement  1 Container Oral TID BM  . folic acid  2 mg Oral Daily  . iopamidol  30 mL Oral Once  . iopamidol      . multivitamin with minerals  1 tablet Oral Daily  . nystatin  5 mL Oral QID  . pantoprazole (PROTONIX) IV  40 mg Intravenous Q12H  . sucralfate  1 g Oral TID WC & HS    Objective: Vital signs in last 24 hours: Temp:  [98 F (36.7 C)-98.7 F (37.1 C)] 98.7 F (37.1 C) (01/22 1243) Pulse Rate:  [83-94] 83 (01/22 1243) Resp:  [15-16] 16 (01/22 1243) BP: (116-132)/(79-86) 132/86 (01/22 1243) SpO2:  [93 %-100 %] 98 % (01/22 1243) Weight:  [121 lb 11.1 oz (55.2 kg)] 121 lb 11.1 oz (55.2 kg) (01/22 0659) Physical Exam  Constitutional:  oriented to person, place, and time. appears chronically ill, emaciated. No distress.  HENT: [place]/AT, PERRLA, scleral icterus Mouth/Throat: Oropharynx is clear and moist. No oropharyngeal exudate.  Cardiovascular: Normal rate, regular  rhythm and normal heart sounds. Exam reveals no gallop and no friction rub.  No murmur heard.  Pulmonary/Chest: CTAB on anterior lung fields. Rib markings visible Abdominal: midly distension. BS are quite. Mild tenderness throughout Neurological: alert and oriented to person, place, and time.  Skin: Skin is warm and dry. No rash noted. No erythema.  Psychiatric: flat affect  Lab Results Recent Labs    01/05/18 1316 01/06/18 0557 01/06/18 1210 01/06/18 1523  WBC 3.1* 2.9*  --   --   HGB 8.0* 7.6*  --   --   HCT 23.8* 22.1*  --   --   NA  --  132* 130* 130*  K  --  3.4* 2.9* 2.8*  CL  --  106 103 102  CO2  --  18* 16* 14*  BUN  --  _0 CREATININE  --  0.36* <0.30* 0.39*   Liver Panel Recent Labs    01/05/18 0527 01/06/18 0557  PROT 4.6* 4.5*  ALBUMIN 1.5* 1.5*  AST 234* 205*  ALT 40 45  ALKPHOS 510* 515*  BILITOT 16.7* 17.0*  BILIDIR >10.0* 12.0*  IBILI NOT CALCULATED 6.7*    Microbiology:  Studies/Results: Ct Head Wo Contrast  Result Date: 01/05/2018 CLINICAL DATA:  42 year old female with history of altered level of consciousness. Fall this morning. Possible  seizure. EXAM: CT HEAD WITHOUT CONTRAST TECHNIQUE: Contiguous axial images were obtained from the base of the skull through the vertex without intravenous contrast. COMPARISON:  Head CT 03/10/2012. FINDINGS: Brain: No evidence of acute infarction, hemorrhage, hydrocephalus, extra-axial collection or mass lesion/mass effect. Vascular: No hyperdense vessel or unexpected calcification. Skull: Normal. Negative for fracture or focal lesion. Sinuses/Orbits: No acute finding. Other: None. IMPRESSION: 1. No acute intracranial abnormalities. The appearance of the brain is normal. Electronically Signed   By: Vinnie Langton M.D.   On: 01/05/2018 11:01     Assessment/Plan: 42yo F with advanced hiv disease, poorly controlled, who was admitted for pancreatitis but still has transaminitis despite lipase normalizing. She  had repeat EGD that revealed eso candidiasis plus eso stricture. 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   Worsening hyperbilirubinemia with mild transmitis = she is showing signs of synthetic dysfunction as her INR continues to increase. Unclear the etiology, agree with getting biopsy, possibly could do transjugular approach. If her liver functions worsen then she is headed towards fulminant liver failure.  Appreciate dr Therisa Doyne input, concern for OI infections like MAC as a cause though she doesn't have diffuse diarrhea, but has weight loss thought to be due to poor po intake over several months. We will start empiric azithromycin and ethambutol to see if any improvement.    Thrombocytopenia - ddx including ITP =  She received solumedrol and continued on steroids today for presumed ITP. Her plt somewhat improved at 19 today. aShe is also getting another plt transfusion for liver biopsy tomorrow but suspect if it is ITP, it may be consumed. Agree with continuing steroids  Severe esophagitis = continue on Iv ppi, carafate. And candidal esophagitis - will continue with anidulafunging. CMV stain was negative  Esophageal stricture = I suspect this is why she is frequently spitting up partial digested foods. Unfortunately, too high risk to dilate until thrombocytopenia is improved  Lactic acidosis = will stop temporarily concern that it is causing lactic acidosis.   hiv disease = advanced, poorly controlled. Temporarily holding biktarvy  Hyperglycemia = possibly related from steroids, defer to primary team for management.  oi proph = holding bactrim and dapsone temporarily due to concern it was worsening hyperbili/transaminitis    [facility] for Infectious Diseases Cell: [phone] Pager: [phone]  01/06/2018, 5:23 PM

## 2018-01-06 NOTE — Progress Notes (Signed)
EEG Completed; Results Pending  

## 2018-01-06 NOTE — Progress Notes (Addendum)
Subjective: The patient was seen and examined at bedside. Continues to have abdominal pain which she characterizes as 8 out of 10 in intensity. Has been able to tolerate soft diet despite of the esophageal stricture noted on endoscopy. Had a fall yesterday and was subsequently started on lactulose for rising bilirubin, complains of diarrhea and loose stools.  Objective: Vital signs in last 24 hours: Temp:  [98 F (36.7 C)-98.3 F (36.8 C)] 98.2 F (36.8 C) (01/22 0659) Pulse Rate:  [84-94] 86 (01/22 0659) Resp:  [15-18] 15 (01/22 0659) BP: (116-130)/(76-82) 129/82 (01/22 0659) SpO2:  [93 %-100 %] 100 % (01/22 0659) Weight:  [55.2 kg (121 lb 11.1 oz)] 55.2 kg (121 lb 11.1 oz) (01/22 0659) Weight change: 3.9 kg (8 lb 9.6 oz) Last BM Date: 01/06/18  PE: Obvious icterus, obvious pallor GENERAL: Frail and thin-appearing, appears chronically ill ABDOMEN: Soft, diffuse mild abdominal tenderness(has a heating pad on), normoactive bowel sounds EXTREMITIES: No deformity  Lab Results: Results for orders placed or performed during the hospital encounter of 12/29/17 (from the past 48 hour(s))  Prepare RBC     Status: None   Collection Time: 01/04/18 10:35 AM  Result Value Ref Range   Order Confirmation ORDER PROCESSED BY BLOOD BANK   TSH     Status: None   Collection Time: 01/04/18  1:17 PM  Result Value Ref Range   TSH 1.770 0.350 - 4.500 uIU/mL    Comment: Performed by a 3rd Generation assay with a functional sensitivity of <=0.01 uIU/mL.  Vitamin B12     Status: Abnormal   Collection Time: 01/04/18  1:17 PM  Result Value Ref Range   Vitamin B-12 3,087 (H) 180 - 914 pg/mL    Comment: (NOTE) This assay is not validated for testing neonatal or myeloproliferative syndrome specimens for Vitamin B12 levels. Performed at [facility], [place] [address], [place], [place] [address]   Folate RBC     Status: Abnormal   Collection Time: 01/04/18  1:17 PM  Result Value Ref Range   Folate, Hemolysate 249.4 Not Estab. ng/mL   Hematocrit 18.7 (L) 34.0 - 46.6 %   Folate, RBC 1,334 >498 ng/mL    Comment: (NOTE) Performed At: [facility] [place], [place] [id number] Rush Farmer MD DG:[id number]   Ferritin     Status: Abnormal   Collection Time: 01/04/18  1:17 PM  Result Value Ref Range   Ferritin 1,933 (H) 11 - 307 ng/mL    Comment: Performed at [facility], [place] [address], [place], [place] [address]  Iron and TIBC     Status: Abnormal   Collection Time: 01/04/18  1:17 PM  Result Value Ref Range   Iron 23 (L) 28 - 170 ug/dL   TIBC 165 (L) 250 - 450 ug/dL   Saturation Ratios 14 10.4 - 31.8 %   UIBC 142 ug/dL    Comment: Performed at [facility], [place] [address] [place],  [address]  Save smear     Status: None   Collection Time: 01/04/18  1:17 PM  Result Value Ref Range   Smear Review SMEAR STAINED AND AVAILABLE FOR REVIEW   Hemoglobin and hematocrit, blood     Status: Abnormal   Collection Time: 01/04/18 10:56 PM  Result Value Ref Range   Hemoglobin 9.5 (L) 12.0 - 15.0 g/dL    Comment: POST TRANSFUSION SPECIMEN   HCT 27.7 (L) 36.0 - 46.0 %  Hepatic function panel  Status: Abnormal   Collection Time: 01/05/18  5:27 AM  Result Value Ref Range   Total Protein 4.6 (L) 6.5 - 8.1 g/dL   Albumin 1.5 (L) 3.5 - 5.0 g/dL   AST 234 (H) 15 - 41 U/L   ALT 40 14 - 54 U/L   Alkaline Phosphatase 510 (H) 38 - 126 U/L   Total Bilirubin 16.7 (H) 0.3 - 1.2 mg/dL   Bilirubin, Direct >10.0 (H) 0.1 - 0.5 mg/dL    Comment: RESULTS CONFIRMED BY MANUAL DILUTION   Indirect Bilirubin NOT CALCULATED 0.3 - 0.9 mg/dL  CBC     Status: Abnormal   Collection Time: 01/05/18  5:27 AM  Result Value Ref Range   WBC 2.8 (L) 4.0 - 10.5 K/uL   RBC 3.06 (L) 3.87 - 5.11 MIL/uL   Hemoglobin 7.7 (L) 12.0 - 15.0 g/dL   HCT 22.9 (L) 36.0 - 46.0 %   MCV 74.8 (L) 78.0 - 100.0 fL   MCH 25.2 (L) 26.0 - 34.0 pg   MCHC 33.6 30.0 - 36.0 g/dL   RDW  22.0 (H) 11.5 - 15.5 %   Platelets 9 (LL) 150 - 400 K/uL    Comment: CRITICAL VALUE NOTED.  VALUE IS CONSISTENT WITH PREVIOUSLY REPORTED AND CALLED VALUE. REPEATED TO VERIFY SPECIMEN CHECKED FOR CLOTS PLATELET COUNT CONFIRMED BY SMEAR   Basic metabolic panel     Status: Abnormal   Collection Time: 01/05/18  5:27 AM  Result Value Ref Range   Sodium 135 135 - 145 mmol/L   Potassium 3.5 3.5 - 5.1 mmol/L   Chloride 109 101 - 111 mmol/L   CO2 21 (L) 22 - 32 mmol/L   Glucose, Bld 73 65 - 99 mg/dL   BUN 10 6 - 20 mg/dL   Creatinine, Ser 0.30 (L) 0.44 - 1.00 mg/dL   Calcium 8.1 (L) 8.9 - 10.3 mg/dL   GFR calc non Af Amer >60 >60 mL/min   GFR calc Af Amer >60 >60 mL/min    Comment: (NOTE) The eGFR has been calculated using the CKD EPI equation. This calculation has not been validated in all clinical situations. eGFR's persistently <60 mL/min signify possible Chronic Kidney Disease.    Anion gap 5 5 - 15  Prepare Pheresed Platelets     Status: None   Collection Time: 01/05/18  7:46 AM  Result Value Ref Range   Unit Number [id number]    Blood Component Type PLTPHER LR1    Unit division 00    Status of Unit ISSUED,FINAL    Transfusion Status OK TO TRANSFUSE   Ammonia     Status: Abnormal   Collection Time: 01/05/18  9:01 AM  Result Value Ref Range   Ammonia 43 (H) 9 - 35 umol/L  Blood gas, venous     Status: Abnormal   Collection Time: 01/05/18  9:26 AM  Result Value Ref Range   pH, Ven 7.332 7.250 - 7.430   pCO2, Ven 39.0 (L) 44.0 - 60.0 mmHg   pO2, Ven 31.6 (LL) 32.0 - 45.0 mmHg   Bicarbonate 20.1 20.0 - 28.0 mmol/L   Acid-base deficit 4.8 (H) 0.0 - 2.0 mmol/L   O2 Saturation 48.4 %   Patient temperature 98.6    Collection site VEIN    Drawn by COLLECTED BY LABORATORY    Sample type VEIN   CBC     Status: Abnormal   Collection Time: 01/05/18  1:16 PM  Result Value Ref Range   WBC 3.1 (  L) 4.0 - 10.5 K/uL   RBC 3.18 (L) 3.87 - 5.11 MIL/uL   Hemoglobin 8.0 (L) 12.0 -  15.0 g/dL   HCT 23.8 (L) 36.0 - 46.0 %   MCV 74.8 (L) 78.0 - 100.0 fL   MCH 25.2 (L) 26.0 - 34.0 pg   MCHC 33.6 30.0 - 36.0 g/dL   RDW 22.3 (H) 11.5 - 15.5 %   Platelets 15 (LL) 150 - 400 K/uL    Comment: REPEATED TO VERIFY SPECIMEN CHECKED FOR CLOTS CRITICAL VALUE NOTED.  VALUE IS CONSISTENT WITH PREVIOUSLY REPORTED AND CALLED VALUE.   Hepatic function panel     Status: Abnormal   Collection Time: 01/06/18  5:57 AM  Result Value Ref Range   Total Protein 4.5 (L) 6.5 - 8.1 g/dL   Albumin 1.5 (L) 3.5 - 5.0 g/dL   AST 205 (H) 15 - 41 U/L   ALT 45 14 - 54 U/L   Alkaline Phosphatase 515 (H) 38 - 126 U/L   Total Bilirubin 17.0 (H) 0.3 - 1.2 mg/dL   Bilirubin, Direct 12.0 (H) 0.1 - 0.5 mg/dL    Comment: RESULTS CONFIRMED BY MANUAL DILUTION   Indirect Bilirubin 6.7 (H) 0.3 - 0.9 mg/dL  CBC with Differential/Platelet     Status: Abnormal   Collection Time: 01/06/18  5:57 AM  Result Value Ref Range   WBC 2.9 (L) 4.0 - 10.5 K/uL   RBC 2.91 (L) 3.87 - 5.11 MIL/uL   Hemoglobin 7.6 (L) 12.0 - 15.0 g/dL   HCT 22.1 (L) 36.0 - 46.0 %   MCV 75.9 (L) 78.0 - 100.0 fL   MCH 26.1 26.0 - 34.0 pg   MCHC 34.4 30.0 - 36.0 g/dL   RDW 22.5 (H) 11.5 - 15.5 %   Platelets 19 (LL) 150 - 400 K/uL    Comment: REPEATED TO VERIFY SPECIMEN CHECKED FOR CLOTS PLATELET COUNT CONFIRMED BY SMEAR CRITICAL VALUE NOTED.  VALUE IS CONSISTENT WITH PREVIOUSLY REPORTED AND CALLED VALUE.    Neutrophils Relative % 81 %   Neutro Abs 2.4 1.7 - 7.7 K/uL   Lymphocytes Relative 5 %   Lymphs Abs 0.1 0.7 - 4.0 K/uL   Monocytes Relative 13 %   Monocytes Absolute 0.4 0.1 - 1.0 K/uL   Eosinophils Relative 0 %   Eosinophils Absolute 0.0 0.0 - 0.7 K/uL   Basophils Relative 1 %   Basophils Absolute 0.0 0.0 - 0.1 K/uL   WBC Morphology DOHLE BODIES     Comment: TOXIC GRANULATION   RBC Morphology TARGET CELLS   Basic metabolic panel     Status: Abnormal   Collection Time: 01/06/18  5:57 AM  Result Value Ref Range   Sodium 132  (L) 135 - 145 mmol/L   Potassium 3.4 (L) 3.5 - 5.1 mmol/L   Chloride 106 101 - 111 mmol/L   CO2 18 (L) 22 - 32 mmol/L   Glucose, Bld 408 (H) 65 - 99 mg/dL   BUN 12 6 - 20 mg/dL   Creatinine, Ser 0.36 (L) 0.44 - 1.00 mg/dL   Calcium 8.0 (L) 8.9 - 10.3 mg/dL   GFR calc non Af Amer >60 >60 mL/min   GFR calc Af Amer >60 >60 mL/min    Comment: (NOTE) The eGFR has been calculated using the CKD EPI equation. This calculation has not been validated in all clinical situations. eGFR's persistently <60 mL/min signify possible Chronic Kidney Disease.    Anion gap 8 5 - 15  Protime-INR  Status: Abnormal   Collection Time: 01/06/18  5:57 AM  Result Value Ref Range   Prothrombin Time 22.7 (H) 11.4 - 15.2 seconds   INR 2.02   Reticulocytes     Status: Abnormal   Collection Time: 01/06/18  5:57 AM  Result Value Ref Range   Retic Ct Pct 2.1 0.4 - 3.1 %   RBC. 2.86 (L) 3.87 - 5.11 MIL/uL   Retic Count, Absolute 60.1 19.0 - 186.0 K/uL    Studies/Results: Ct Head Wo Contrast  Result Date: 01/05/2018 CLINICAL DATA:  42 year old female with history of altered level of consciousness. Fall this morning. Possible seizure. EXAM: CT HEAD WITHOUT CONTRAST TECHNIQUE: Contiguous axial images were obtained from the base of the skull through the vertex without intravenous contrast. COMPARISON:  Head CT 03/10/2012. FINDINGS: Brain: No evidence of acute infarction, hemorrhage, hydrocephalus, extra-axial collection or mass lesion/mass effect. Vascular: No hyperdense vessel or unexpected calcification. Skull: Normal. Negative for fracture or focal lesion. Sinuses/Orbits: No acute finding. Other: None. IMPRESSION: 1. No acute intracranial abnormalities. The appearance of the brain is normal. Electronically Signed   By: Vinnie Langton M.D.   On: 01/05/2018 11:01    Medications: I have reviewed the patient's current medications.  Assessment: 1.Candida esophagitis on esophageal biopsies: on Anidulafungin  (fluconzaole stopped due to rising bilirubin), CMV immunostaining negative. Severe esophagitis: Clinically doing better on Protonix 40 mg IV twice a day and [name] 1 g 4 times a day.  2. Abnormal LFTs, worsening hyperbilirubinemia(mostly conjugated), pattern consistent with intrahepatic cholestasis. Discussed with Dr. Florene Glen, recommend liver biopsy, other workup for chronic liver disease will be sent, concern for opportunistic infection such as MAC in the setting of poorly controlled HIV. Agree with Dr. Baxter Flattery: CMV hepatitis would produce more hepatocellular dysfunction than intrahepatic cholestasis. No signs of encephalopathy, however INR elevated to 2.02: Concern for impending liver failure.  3. History of poorly controlled HIV 4. Pancytopenia, worsening thrombocytopenia 5. Hyperglycemia after IV Solu-Medrol, with acidosis 6. Pancreatitis, etiology unknown  Plan: Liver biopsy, discussed with Dr. Florene Glen regarding need for platelet transfusion prior to liver biopsy. Elevated INR in the setting of rising bilirubin is a concern for impending liver failure. Guarded prognosis    Ronnette Juniper 01/06/2018, 10:21 AM   Pager [phone] If no answer or after 5 PM call [phone]

## 2018-01-06 NOTE — Progress Notes (Signed)
Ms. Bango, for some reason, has a rising bilirubin.  Today, her bilirubin is 17.  Her alkaline phosphatase is 515.  Her blood sugar, for some reason is 408 now.  She had 1 dose of Solu-Medrol yesterday.  She did get platelets yesterday.  Her platelet count today is 19,000.  Her hemoglobin is 7.6.  This is dropping slowly.  Her white cell count is 2.9.  It is hard to say how much she is really eating right now.  There is no vomiting.  She said that she is having some diarrhea.  She is on lactulose.  Possibly, the lactulose needs to be discontinued.  She apparently fell yesterday.  She says that she passed out.  She had a CT scan of the brain yesterday.  This was negative.  She is on Eraxis.  She also is on Biktarvy.  On her physical exam, her vital signs are stable.  She is afebrile.  Her blood pressure is 129/82.  Her head exam shows no obvious oral thrush.  She does have scleral icterus.  There is no adenopathy in the neck.  Lungs are clear bilaterally.  Cardiac exam regular rate and rhythm.  She has no murmurs.  Abdomen is soft.  Bowel sounds are somewhat decreased.  There is no fluid wave.  There is no palpable abdominal mass.  There is no obvious liver or spleen tip.  Extremities shows no clubbing, cyanosis or edema.  She has some muscle atrophy in upper and lower extremities.  It seems like there is a lot going on here.  Again I am not sure why she has such a high bilirubin.  This continues to climb.  I do not know she has a stone that might be causing biliary obstruction.  I do not think she is hemolyzing.  I would get her reticulocyte count.  Again, I still believe that a lot of her hematologic issues are related to her HIV not being controlled.  We will continue to follow her along.  [name], MD  [facility] 3:25-26

## 2018-01-06 NOTE — Progress Notes (Signed)
Referring Physician(s): Powell,A/Karki,A  Supervising Physician: Markus Daft  Patient Status:  [facility] - In-pt  Chief Complaint:  Elevated liver function tests  Subjective: Patient familiar to IR service from prior bone marrow biopsy on 10/23/17.  She has a history of HIV/AIDS, candidal esophagitis, GERD, bipolar disorder, prior cholecystectomy, asthma.  She was admitted to [facility] on 12/29/17 with persistent abdominal pain, pancreatitis, recent confusion and fall.  MRI of the abdomen on 1/16 revealed mild pancreatic edema associated with pancreatitis, moderate intraperitoneal free fluid, no biliary obstruction, mild retroperitoneal lymphadenopathy.  CT head was negative for acute intracranial abnormality.  Current labs include WBC 2.9, hemoglobin 7.6, platelets 19k, creatinine 0.3, PT 22.7, INR 2.02, potassium 2.9, total bilirubin 17.  Request now received from primary care service and gastroenterology for image guided random core liver biopsy for further evaluation.  Hepatitis serologies pending.  She currently denies fever, headache, chest pain, dyspnea, nausea, vomiting.  She does have some abdominal fullness with associated mild generalized discomfort, weight loss, few streaks of blood when coughing secondary to known esophagitis. Past Medical History:  Diagnosis Date  . Acute psychosis ([place]) 03/10/12   2nd admission in last wk for this  . Angio-edema   . Anxiety   . Asthma    inhaler 2xday  . Bipolar disorder ([place])   . Depression   . GERD (gastroesophageal reflux disease)   . Hepatitis B    /E-chart  . History of blood transfusion   . HIV positive ([place])   . Microcytic anemia    h/o per E-chart  . Noncompliance with medication regimen    /e-chart  . 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 Pyelonephritis    h/o per E-chart  . Shortness of breath dyspnea    due to Asthma  . Thyroid disease   . Urticaria    Past  Surgical History:  Procedure Laterality Date  . BALLOON DILATION N/A 02/14/2015   Procedure: BALLOON DILATION;  Surgeon: Lear Ng, MD;  Location: [facility] ENDOSCOPY;  Service: Endoscopy;  Laterality: N/A;  . BALLOON DILATION N/A 10/25/2016   Procedure: BALLOON DILATION;  Surgeon: Wilford Corner, MD;  Location: [facility] ENDOSCOPY;  Service: Endoscopy;  Laterality: N/A;  . BALLOON DILATION N/A 01/14/2017   Procedure: BALLOON DILATION;  Surgeon: Wilford Corner, MD;  Location: [facility] ENDOSCOPY;  Service: Endoscopy;  Laterality: N/A;  . BALLOON DILATION N/A 04/30/2017   Procedure: BALLOON DILATION;  Surgeon: Wilford Corner, MD;  Location: [facility] ENDOSCOPY;  Service: Endoscopy;  Laterality: N/A;  . CHOLECYSTECTOMY    . CHOLECYSTECTOMY N/A 10/15/2017   Procedure: LAPAROSCOPIC CHOLECYSTECTOMY WITH INTRAOPERATIVE CHOLANGIOGRAM;  Surgeon: Coralie Keens, MD;  Location: [place];  Service: General;  Laterality: N/A;  . ESOPHAGOGASTRODUODENOSCOPY N/A 07/13/2013   Procedure: ESOPHAGOGASTRODUODENOSCOPY (EGD);  Surgeon: Beryle Beams, MD;  Location: [facility] ENDOSCOPY;  Service: Endoscopy;  Laterality: N/A;  . ESOPHAGOGASTRODUODENOSCOPY N/A 08/12/2014   Procedure: ESOPHAGOGASTRODUODENOSCOPY (EGD);  Surgeon: Milus Banister, MD;  Location: [facility] ENDOSCOPY;  Service: Endoscopy;  Laterality: N/A;  . ESOPHAGOGASTRODUODENOSCOPY N/A 02/14/2015   Procedure: ESOPHAGOGASTRODUODENOSCOPY (EGD);  Surgeon: Lear Ng, MD;  Location: [facility] ENDOSCOPY;  Service: Endoscopy;  Laterality: N/A;  . ESOPHAGOGASTRODUODENOSCOPY N/A 10/25/2016   Procedure: ESOPHAGOGASTRODUODENOSCOPY (EGD);  Surgeon: Wilford Corner, MD;  Location: [facility] ENDOSCOPY;  Service: Endoscopy;  Laterality: N/A;  . ESOPHAGOGASTRODUODENOSCOPY (EGD) WITH PROPOFOL N/A 04/11/2015   Procedure: ESOPHAGOGASTRODUODENOSCOPY (EGD) WITH PROPOFOL;  Surgeon: Wilford Corner, MD;  Location: [facility] ENDOSCOPY;  Service:  Endoscopy;  Laterality: N/A;  . ESOPHAGOGASTRODUODENOSCOPY (EGD) WITH  PROPOFOL N/A 09/11/2015   Procedure: ESOPHAGOGASTRODUODENOSCOPY (EGD) WITH PROPOFOL;  Surgeon: Wilford Corner, MD;  Location: [facility] ENDOSCOPY;  Service: Endoscopy;  Laterality: N/A;  . ESOPHAGOGASTRODUODENOSCOPY (EGD) WITH PROPOFOL N/A 01/14/2017   Procedure: ESOPHAGOGASTRODUODENOSCOPY (EGD) WITH PROPOFOL;  Surgeon: Wilford Corner, MD;  Location: [facility] ENDOSCOPY;  Service: Endoscopy;  Laterality: N/A;  . ESOPHAGOGASTRODUODENOSCOPY (EGD) WITH PROPOFOL N/A 04/30/2017   Procedure: ESOPHAGOGASTRODUODENOSCOPY (EGD) WITH PROPOFOL;  Surgeon: Wilford Corner, MD;  Location: [place];  Service: Endoscopy;  Laterality: N/A;  . ESOPHAGOGASTRODUODENOSCOPY (EGD) WITH PROPOFOL N/A 01/02/2018   Procedure: ESOPHAGOGASTRODUODENOSCOPY (EGD) WITH PROPOFOL;  Surgeon: Wonda Horner, MD;  Location: [facility] ENDOSCOPY;  Service: Endoscopy;  Laterality: N/A;  . SAVORY DILATION N/A 02/14/2015   Procedure: SAVORY DILATION;  Surgeon: Lear Ng, MD;  Location: [place];  Service: Endoscopy;  Laterality: N/A;  no xray needed  . SAVORY DILATION N/A 04/11/2015   Procedure: SAVORY DILATION;  Surgeon: Wilford Corner, MD;  Location: [facility] ENDOSCOPY;  Service: Endoscopy;  Laterality: N/A;  . SAVORY DILATION N/A 09/11/2015   Procedure: SAVORY DILATION;  Surgeon: Wilford Corner, MD;  Location: [facility] ENDOSCOPY;  Service: Endoscopy;  Laterality: N/A;  . SAVORY DILATION N/A 04/30/2017   Procedure: SAVORY DILATION;  Surgeon: Wilford Corner, MD;  Location: [place];  Service: Endoscopy;  Laterality: N/A;  . THYROID LOBECTOMY  08/2002   left & isthmectomy; for benign thyroid adenoma/E-chart  . THYROID SURGERY    . TUBAL LIGATION  07/2002   /E-chart     Allergies: Other; Peanut-containing drug products; Shellfish allergy; Bactrim; Orange fruit; and Sulfa antibiotics  Medications: Prior to Admission medications   Medication Sig Start Date End Date Taking? Authorizing Provider  albuterol (PROAIR HFA) 108 (90 BASE) MCG/ACT  inhaler Inhale 2 puffs into the lungs every 4 (four) hours as needed for wheezing or shortness of breath (cough). 11/16/15  Yes Gean Quint, MD  benztropine (COGENTIN) 1 MG tablet Take 1 tablet (1 mg total) by mouth daily. 11/12/16  Yes Michel Bickers, MD  dapsone 100 MG tablet Take 100 mg by mouth daily.   Yes [provider]  bictegravir-emtricitabine-tenofovir AF (BIKTARVY) 50-200-25 MG TABS tablet Take 1 tablet daily by mouth. Patient not taking: Reported on 12/29/2017 10/24/17   Kayleen Memos, DO  fluconazole (DIFLUCAN) 200 MG tablet Take 1 tablet (200 mg total) daily by mouth. Patient not taking: Reported on 12/29/2017 10/25/17   Kayleen Memos, DO  OLANZapine (ZYPREXA) 5 MG tablet Take 1 tablet (5 mg total) by mouth at bedtime. 11/12/16   Michel Bickers, MD  ondansetron (ZOFRAN ODT) 8 MG disintegrating tablet Take 1 tablet (8 mg total) by mouth every 8 (eight) hours as needed for nausea or vomiting. Patient not taking: Reported on 12/29/2017 09/03/17   Carlyle Basques, MD  pantoprazole (PROTONIX) 40 MG tablet Take 1 tablet (40 mg total) by mouth daily. Patient not taking: Reported on 12/29/2017 11/12/16   Michel Bickers, MD  traMADol (ULTRAM) 50 MG tablet Take 1 tablet (50 mg total) by mouth every 6 (six) hours as needed for moderate pain. Patient not taking: Reported on 12/29/2017 08/05/17   Charlynne Cousins, MD     Vital Signs: BP 132/86 (BP Location: Right Arm)   Pulse 83   Temp 98.7 F (37.1 C) (Oral)   Resp 16   Ht 6' 1"  (1.854 m)   Wt 121 lb 11.1 oz (55.2 kg)   LMP 12/15/2017 Comment:  negative beta HCG 12/29/17  SpO2 98%   BMI 16.06 kg/m   Physical Exam thin black female in no acute distress.  Alert and oriented.  Answers questions appropriately.  Chest with slightly diminished breath sounds at bases, heart with regular rate and rhythm.  Abdomen sl distended, few bowel sounds, mild generalized tenderness.  Bilateral lower extremity edema noted.  Imaging: Ct Head  Wo Contrast  Result Date: 01/05/2018 CLINICAL DATA:  42 year old female with history of altered level of consciousness. Fall this morning. Possible seizure. EXAM: CT HEAD WITHOUT CONTRAST TECHNIQUE: Contiguous axial images were obtained from the base of the skull through the vertex without intravenous contrast. COMPARISON:  Head CT 03/10/2012. FINDINGS: Brain: No evidence of acute infarction, hemorrhage, hydrocephalus, extra-axial collection or mass lesion/mass effect. Vascular: No hyperdense vessel or unexpected calcification. Skull: Normal. Negative for fracture or focal lesion. Sinuses/Orbits: No acute finding. Other: None. IMPRESSION: 1. No acute intracranial abnormalities. The appearance of the brain is normal. Electronically Signed   By: Vinnie Langton M.D.   On: 01/05/2018 11:01    Labs:  CBC: Recent Labs    01/04/18 0534  01/04/18 2256 01/05/18 0527 01/05/18 1316 01/06/18 0557  WBC 2.3*  --   --  2.8* 3.1* 2.9*  HGB 6.9*   < > 9.5* 7.7* 8.0* 7.6*  HCT 19.9*   < > 27.7* 22.9* 23.8* 22.1*  PLT 24*  --   --  9* 15* 19*   < > = values in this interval not displayed.    COAGS: Recent Labs    07/30/17 1914 01/01/18 1522 01/02/18 0507 01/04/18 0534 01/06/18 0557  INR 1.11 1.29 1.31 1.38 2.02  APTT 32  --   --  44*  --     BMP: Recent Labs    01/04/18 0534 01/05/18 0527 01/06/18 0557 01/06/18 1210  NA 132* 135 132* 130*  K 3.7 3.5 3.4* 2.9*  CL 105 109 106 103  CO2 23 21* 18* 16*  GLUCOSE 80 73 408* 376*  BUN 8 10 12 9   CALCIUM 8.0* 8.1* 8.0* 8.0*  CREATININE 0.30* 0.30* 0.36* <0.30*  GFRNONAA >60 >60 >60 NOT CALCULATED  GFRAA >60 >60 >60 NOT CALCULATED    LIVER FUNCTION TESTS: Recent Labs    01/03/18 0625 01/04/18 0534 01/05/18 0527 01/06/18 0557  BILITOT 13.4* 14.8* 16.7* 17.0*  AST 213* 236* 234* 205*  ALT 35 37 40 45  ALKPHOS 688* 570* 510* 515*  PROT 5.1* 4.6* 4.6* 4.5*  ALBUMIN 1.8* 1.5* 1.5* 1.5*    Assessment and Plan: Pt with history of  HIV/AIDS, candidal esophagitis, GERD, bipolar disorder, prior cholecystectomy, asthma.  She was admitted to [facility] on 12/29/17 with persistent abdominal pain, pancreatitis, recent confusion and fall.  MRI of the abdomen on 1/16 revealed mild pancreatic edema associated with pancreatitis, moderate intraperitoneal free fluid, no biliary obstruction, mild retroperitoneal lymphadenopathy.  CT head was negative for acute intracranial abnormality.  Current labs include WBC 2.9, hemoglobin 7.6, platelets 19k, creatinine 0.3, PT 22.7, INR 2.02, potassium 2.9, total bilirubin 17.  Request now received from primary care service and gastroenterology for image guided random core liver biopsy for further evaluation.  Hepatitis serologies pending.  Latest imaging studies have been reviewed by Dr.Henn. Risks and benefits of liver biopsy discussed with the patient including, but not limited to bleeding, infection, damage to adjacent structures or low yield requiring additional tests.All of the patient's questions were answered, patient is agreeable to proceed.Consent signed and in  chart.  In view of significant lab abnormalities transjugular approach for liver biopsy planned.  Procedure scheduled for tomorrow.Recheck labs in am.     Electronically Signed: D. Rowe Robert, PA-C 01/06/2018, 2:39 PM   I spent a total of 30 minutes at the the patient's bedside AND on the patient's hospital floor or unit, greater than 50% of which was counseling/coordinating care for     Patient ID: [name], female   DOB: 1976/06/28, 42 y.o.   MRN: [id number]

## 2018-01-06 NOTE — Consult Note (Signed)
Name: [name] MRN: [id number] DOB: June 02, 1976    ADMISSION DATE:  12/29/2017 CONSULTATION DATE:  01/06/2018  REFERRING MD :  Florene Glen  CHIEF COMPLAINT:  Abdominal pain  HISTORY OF PRESENT ILLNESS: 42 year old woman with ARDS, current CD4 count 8, admitted 12/5012 with epigastric abdominal pain for 2 weeks.  She had significant weight loss over the past 3 months.  She had a cholecystectomy in November 2018 and review of her labs back been show a high alkaline phosphatase with bilirubin in the 3.7 range. She was found to have a lipase more than 1000 and admitting diagnosis was acute pancreatitis.  She had an MRCP which did not suggest any obstructive etiology. Course was complicated by severe thrombocytopenia and she is being treated empirically as ITP and received his dose of Solu-Medrol on 1/21 and is on dexamethasone on 1/22. Labs on 1/22 show hyperglycemia with an anion gap acidosis and lactate was noted to be 7.5, hence the consult.  Patient reports abdominal tightness, reports 1 episode of loose stool.  There is no history of hypotension or hypoxia during this admission.  Her weight is down to 95 pounds at the time of admission. She has a past medical history of bipolar disorder, asthma and Candida esophagitis.  She also has a history of neutropenia and underwent bone marrow biopsy in 10/2017.  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, AFB fungus cultures were negative and cytogenetics was normal  she admits to poor compliance with her HIV medications   SIGNIFICANT EVENTS    STUDIES:  CT abdomen/pelvis 1/15-diffuse acute pancreatitis, small amount of ascites, chronic retroperitoneal and porta hepatis lymphadenopathy MRCP 1/16 no biliary obstruction  CT chest 10/2017 splenomegaly, enlarged periaortic and borderline enlarged mediastinal and right hilar lymph nodes   PAST MEDICAL HISTORY :   has a past medical history of Acute  psychosis ([place]) (03/10/12), Angio-edema, Anxiety, Asthma, Bipolar disorder ([place]), Depression, GERD (gastroesophageal reflux disease), Hepatitis B, History of blood transfusion, HIV positive ([place]), Microcytic anemia, Noncompliance with medication regimen, Pneumonia (02/2009), Pyelonephritis, Shortness of breath dyspnea, Thyroid disease, and Urticaria.  has a past surgical history that includes Thyroid surgery; Thyroid lobectomy (08/2002); Tubal ligation (07/2002); Esophagogastroduodenoscopy (N/A, 07/13/2013); Esophagogastroduodenoscopy (N/A, 08/12/2014); Esophagogastroduodenoscopy (N/A, 02/14/2015); Balloon dilation (N/A, 02/14/2015); Savory dilation (N/A, 02/14/2015); Esophagogastroduodenoscopy (egd) with propofol (N/A, 04/11/2015); Savory dilation (N/A, 04/11/2015); Esophagogastroduodenoscopy (egd) with propofol (N/A, 09/11/2015); Savory dilation (N/A, 09/11/2015); Esophagogastroduodenoscopy (N/A, 10/25/2016); Balloon dilation (N/A, 10/25/2016); Esophagogastroduodenoscopy (egd) with propofol (N/A, 01/14/2017); Balloon dilation (N/A, 01/14/2017); Esophagogastroduodenoscopy (egd) with propofol (N/A, 04/30/2017); Balloon dilation (N/A, 04/30/2017); Savory dilation (N/A, 04/30/2017); Cholecystectomy; Cholecystectomy (N/A, 10/15/2017); and Esophagogastroduodenoscopy (egd) with propofol (N/A, 01/02/2018). Prior to Admission medications   Medication Sig Start Date End Date Taking? Authorizing Provider  albuterol (PROAIR HFA) 108 (90 BASE) MCG/ACT inhaler Inhale 2 puffs into the lungs every 4 (four) hours as needed for wheezing or shortness of breath (cough). 11/16/15  Yes Gean Quint, MD  benztropine (COGENTIN) 1 MG tablet Take 1 tablet (1 mg total) by mouth daily. 11/12/16  Yes Michel Bickers, MD  dapsone 100 MG tablet Take 100 mg by mouth daily.   Yes [provider]  bictegravir-emtricitabine-tenofovir AF (BIKTARVY) 50-200-25 MG TABS tablet Take 1 tablet daily by mouth. Patient not taking: Reported on 12/29/2017 10/24/17    Kayleen Memos, DO  fluconazole (DIFLUCAN) 200 MG tablet Take 1 tablet (200 mg total) daily by mouth. Patient not taking: Reported on 12/29/2017 10/25/17   [name],  Carole N, DO  OLANZapine (ZYPREXA) 5 MG tablet Take 1 tablet (5 mg total) by mouth at bedtime. 11/12/16   Michel Bickers, MD  ondansetron (ZOFRAN ODT) 8 MG disintegrating tablet Take 1 tablet (8 mg total) by mouth every 8 (eight) hours as needed for nausea or vomiting. Patient not taking: Reported on 12/29/2017 09/03/17   Carlyle Basques, MD  pantoprazole (PROTONIX) 40 MG tablet Take 1 tablet (40 mg total) by mouth daily. Patient not taking: Reported on 12/29/2017 11/12/16   Michel Bickers, MD  traMADol (ULTRAM) 50 MG tablet Take 1 tablet (50 mg total) by mouth every 6 (six) hours as needed for moderate pain. Patient not taking: Reported on 12/29/2017 08/05/17   Charlynne Cousins, MD   Allergies  Allergen Reactions  . Other Hives and Itching    Berries, TREE NUTS  . Peanut-Containing Drug Products Hives  . Shellfish Allergy Hives  . Bactrim Itching  . Orange Fruit Itching  . Sulfa Antibiotics Itching    FAMILY HISTORY:  family history includes Asthma in her son; Cancer in her mother; Diabetes in her father, mother, and sister; Heart disease in her father; Urticaria in her sister. SOCIAL HISTORY:  reports that  has never smoked. she has never used smokeless tobacco. She reports that she does not drink alcohol or use drugs.  REVIEW OF SYSTEMS:    Positive for abdominal tightness, jaundice, generalized weakness Difficulty swallowing and poor appetite  Constitutional: negative for fevers and sweats  Eyes: negative for irritation, redness and visual disturbance  Ears, nose, mouth, throat, and face: negative for earaches, epistaxis, nasal congestion and sore throat  Respiratory: negative for cough, dyspnea on exertion, sputum and wheezing  Cardiovascular: negative for chest pain, dyspnea, lower extremity edema, orthopnea,  palpitations and syncope  Gastrointestinal: negative for constipation, diarrhea, melena, nausea and vomiting  Genitourinary:negative for dysuria, frequency and hematuria  Hematologic/lymphatic: negative for bleeding, easy bruising   Musculoskeletal:negative for arthralgias, muscle weakness and stiff joints  Neurological: negative for coordination problems, gait problems, headaches and weakness  Endocrine: negative for diabetic symptoms including polydipsia, polyuria and weight loss   SUBJECTIVE:   VITAL SIGNS: Temp:  [98 F (36.7 C)-98.7 F (37.1 C)] 98.7 F (37.1 C) (01/22 1243) Pulse Rate:  [83-94] 83 (01/22 1243) Resp:  [15-16] 16 (01/22 1243) BP: (116-132)/(79-86) 132/86 (01/22 1243) SpO2:  [93 %-100 %] 98 % (01/22 1243) Weight:  [121 lb 11.1 oz (55.2 kg)] 121 lb 11.1 oz (55.2 kg) (01/22 0659)  PHYSICAL EXAMINATION: Gen. Pleasant, thin, in no distress, anxious/depressed affect ENT -icterus, no post nasal drip Neck: No JVD, no thyromegaly, no carotid bruits Lungs: no use of accessory muscles, no dullness to percussion, clear without rales or rhonchi  Cardiovascular: Rhythm regular, heart sounds  normal, no murmurs or gallops, no peripheral edema Abdomen: Mild tenderness epigastrium and right hypochondrium , no hepatosplenomegaly, BS normal. Musculoskeletal: No deformities, no cyanosis or clubbing Neuro:  alert, non focal   Recent Labs  Lab 01/06/18 0557 01/06/18 1210 01/06/18 1523  NA 132* 130* 130*  K 3.4* 2.9* 2.8*  CL 106 103 102  CO2 18* 16* 14*  BUN _0 CREATININE 0.36* <0.30* 0.39*  GLUCOSE 408* 376* 337*   Recent Labs  Lab 01/05/18 0527 01/05/18 1316 01/06/18 0557  HGB 7.7* 8.0* 7.6*  HCT 22.9* 23.8* 22.1*  WBC 2.8* 3.1* 2.9*  PLT 9* 15* 19*   Ct Head Wo Contrast  Result Date: 01/05/2018 CLINICAL DATA:  42 year old female  with history of altered level of consciousness. Fall this morning. Possible seizure. EXAM: CT HEAD WITHOUT CONTRAST  TECHNIQUE: Contiguous axial images were obtained from the base of the skull through the vertex without intravenous contrast. COMPARISON:  Head CT 03/10/2012. FINDINGS: Brain: No evidence of acute infarction, hemorrhage, hydrocephalus, extra-axial collection or mass lesion/mass effect. Vascular: No hyperdense vessel or unexpected calcification. Skull: Normal. Negative for fracture or focal lesion. Sinuses/Orbits: No acute finding. Other: None. IMPRESSION: 1. No acute intracranial abnormalities. The appearance of the brain is normal. Electronically Signed   By: Vinnie Langton M.D.   On: 01/05/2018 11:01    ASSESSMENT / PLAN:  Lactic acidosis  -unexplained etiology, no evidence of compartment syndrome, doubt ischemic bowel.  She did not have hypotension or hypoxia during this admit. Wonder if this is related to medications or other D lactic acidosis Does not seem to be related to sepsis We will repeat in 12 hours We will check a.m. cortisol for completion but this may be suppressed due to IV steroids given  Hyperglycemia-likely steroid-induced, does have anion  gap acidosis but this is predominantly due to lactate , can check beta hydroxybutyrate for completion She is being transferred to ICU for IV insulin drip , she may be very sensitive to the liver disease  Cholestatic liver disease -no evidence of obstructive jaundice, differential diagnosis here again includes medication, HIV cholangiopathy, MAC or disseminated CMV -ongoing workup by ID  Thrombocytopenia-  being treated as ITP, negative bone marrow biopsy in 10/2017   P CCM will follow with you  Kara Mead MD. FCCP. [facility] Pager [phone] If no response call [phone]    01/06/2018, 5:49 PM

## 2018-01-06 NOTE — Procedures (Signed)
HPI:  42 y/o with MS change  TECHNICAL SUMMARY:  A multichannel referential and bipolar montage EEG using the standard international 10-20 system was performed on the patient described as mostly asleep and very briefly awake.  During the very brief portion of the tracing in which the patient was awake, there is a 6 Hz occipital dominant rhythm.  There is a significant amount of artifact during this portion of the tracing.  Most of the tracing is spent in sleep.    Low voltage fast (beta) activity is distributed symmetrically and maximally over the anterior head regions.  ACTIVATION:  Stepwise photic stimulation and hyperventilation are not performed.  EPILEPTIFORM ACTIVITY:  There were no spikes, sharp waves or paroxysmal activity.  SLEEP: Stage I and stage II sleep architecture are noted.  CARDIAC:  The EKG lead revealed a regular sinus rhythm.  IMPRESSION:  This is an abnormal EEG demonstrating a mild diffuse slowing of electrocerebral activity.  This can be seen in a wide variety of encephalopathic state including those of a toxic, metabolic, or degenerative nature.  There were no focal, hemispheric, or lateralizing features.  No epileptiform activity was recorded.  Most of this recording, however, is spent in sleep and very little waking recording is noted.  Correlate clinically.

## 2018-01-07 ENCOUNTER — Encounter (HOSPITAL_COMMUNITY): Payer: Self-pay | Admitting: Diagnostic Radiology

## 2018-01-07 ENCOUNTER — Inpatient Hospital Stay (HOSPITAL_COMMUNITY): Payer: Medicaid Other

## 2018-01-07 DIAGNOSIS — E872 Acidosis, unspecified: Secondary | ICD-10-CM

## 2018-01-07 DIAGNOSIS — D696 Thrombocytopenia, unspecified: Secondary | ICD-10-CM

## 2018-01-07 DIAGNOSIS — K859 Acute pancreatitis without necrosis or infection, unspecified: Principal | ICD-10-CM

## 2018-01-07 DIAGNOSIS — R748 Abnormal levels of other serum enzymes: Secondary | ICD-10-CM

## 2018-01-07 DIAGNOSIS — B3781 Candidal esophagitis: Secondary | ICD-10-CM

## 2018-01-07 HISTORY — PX: IR TRANSCATHETER BX: IMG713

## 2018-01-07 HISTORY — PX: IR VENOGRAM HEPATIC W HEMODYNAMIC EVALUATION: IMG692

## 2018-01-07 LAB — BASIC METABOLIC PANEL
ANION GAP: 3 — AB (ref 5–15)
ANION GAP: 7 (ref 5–15)
ANION GAP: 10 (ref 5–15)
Anion gap: 6 (ref 5–15)
BUN: 7 mg/dL (ref 6–20)
BUN: 7 mg/dL (ref 6–20)
BUN: 7 mg/dL (ref 6–20)
BUN: 8 mg/dL (ref 6–20)
CHLORIDE: 114 mmol/L — AB (ref 101–111)
CHLORIDE: 111 mmol/L (ref 101–111)
CHLORIDE: 108 mmol/L (ref 101–111)
CO2: 16 mmol/L — AB (ref 22–32)
CO2: 17 mmol/L — AB (ref 22–32)
CO2: 17 mmol/L — AB (ref 22–32)
CO2: 19 mmol/L — AB (ref 22–32)
Calcium: 7.4 mg/dL — ABNORMAL LOW (ref 8.9–10.3)
Calcium: 7.4 mg/dL — ABNORMAL LOW (ref 8.9–10.3)
Calcium: 7.6 mg/dL — ABNORMAL LOW (ref 8.9–10.3)
Calcium: 7.8 mg/dL — ABNORMAL LOW (ref 8.9–10.3)
Chloride: 112 mmol/L — ABNORMAL HIGH (ref 101–111)
Creatinine, Ser: <0.3 mg/dL — ABNORMAL LOW (ref 0.44–1.00)
Creatinine, Ser: <0.3 mg/dL — ABNORMAL LOW (ref 0.44–1.00)
Creatinine, Ser: <0.3 mg/dL — ABNORMAL LOW (ref 0.44–1.00)
Creatinine, Ser: <0.3 mg/dL — ABNORMAL LOW (ref 0.44–1.00)
GLUCOSE: 150 mg/dL — AB (ref 65–99)
GLUCOSE: 293 mg/dL — AB (ref 65–99)
Glucose, Bld: 134 mg/dL — ABNORMAL HIGH (ref 65–99)
Glucose, Bld: 137 mg/dL — ABNORMAL HIGH (ref 65–99)
POTASSIUM: 3.2 mmol/L — AB (ref 3.5–5.1)
POTASSIUM: 3.4 mmol/L — AB (ref 3.5–5.1)
POTASSIUM: 3.5 mmol/L (ref 3.5–5.1)
POTASSIUM: 3.7 mmol/L (ref 3.5–5.1)
SODIUM: 135 mmol/L (ref 135–145)
Sodium: 136 mmol/L (ref 135–145)
Sodium: 135 mmol/L (ref 135–145)
Sodium: 134 mmol/L — ABNORMAL LOW (ref 135–145)

## 2018-01-07 LAB — GLUCOSE, CAPILLARY
GLUCOSE-CAPILLARY: 125 mg/dL — AB (ref 65–99)
GLUCOSE-CAPILLARY: 149 mg/dL — AB (ref 65–99)
GLUCOSE-CAPILLARY: 209 mg/dL — AB (ref 65–99)
Glucose-Capillary: 238 mg/dL — ABNORMAL HIGH (ref 65–99)
Glucose-Capillary: 150 mg/dL — ABNORMAL HIGH (ref 65–99)
Glucose-Capillary: 296 mg/dL — ABNORMAL HIGH (ref 65–99)
Glucose-Capillary: 204 mg/dL — ABNORMAL HIGH (ref 65–99)

## 2018-01-07 LAB — CBC WITH DIFFERENTIAL/PLATELET
BASOS PCT: 0 %
Basophils Absolute: 0 10*3/uL (ref 0.0–0.1)
Basophils Absolute: 0 10*3/uL (ref 0.0–0.1)
Basophils Relative: 0 %
EOS ABS: 0 10*3/uL (ref 0.0–0.7)
EOS PCT: 0 %
EOS PCT: 0 %
Eosinophils Absolute: 0 10*3/uL (ref 0.0–0.7)
HCT: 19.2 % — ABNORMAL LOW (ref 36.0–46.0)
HEMATOCRIT: 24.7 % — AB (ref 36.0–46.0)
HEMOGLOBIN: 6.7 g/dL — AB (ref 12.0–15.0)
HEMOGLOBIN: 8.6 g/dL — AB (ref 12.0–15.0)
LYMPHS ABS: 0.1 10*3/uL (ref 0.7–4.0)
LYMPHS PCT: 4 %
LYMPHS PCT: 3 %
Lymphs Abs: 0.1 10*3/uL — ABNORMAL LOW (ref 0.7–4.0)
MCH: 25.9 pg — ABNORMAL LOW (ref 26.0–34.0)
MCH: 27 pg (ref 26.0–34.0)
MCHC: 34.9 g/dL (ref 30.0–36.0)
MCHC: 34.8 g/dL (ref 30.0–36.0)
MCV: 74.1 fL — AB (ref 78.0–100.0)
MCV: 77.7 fL — AB (ref 78.0–100.0)
MONOS PCT: 6 %
MONOS PCT: 8 %
Monocytes Absolute: 0.2 10*3/uL (ref 0.1–1.0)
Monocytes Absolute: 0.3 10*3/uL (ref 0.1–1.0)
Neutro Abs: 3.1 10*3/uL (ref 1.7–7.7)
Neutro Abs: 3.3 10*3/uL (ref 1.7–7.7)
Neutrophils Relative %: 90 %
Neutrophils Relative %: 89 %
Platelets: 23 10*3/uL — CL (ref 150–400)
Platelets: 41 10*3/uL — ABNORMAL LOW (ref 150–400)
RBC: 2.59 MIL/uL — ABNORMAL LOW (ref 3.87–5.11)
RBC: 3.18 MIL/uL — ABNORMAL LOW (ref 3.87–5.11)
RDW: 22.7 % — ABNORMAL HIGH (ref 11.5–15.5)
RDW: 23.3 % — ABNORMAL HIGH (ref 11.5–15.5)
WBC: 3.4 10*3/uL — ABNORMAL LOW (ref 4.0–10.5)
WBC: 3.7 10*3/uL — ABNORMAL LOW (ref 4.0–10.5)

## 2018-01-07 LAB — CULTURE, BLOOD (ROUTINE X 2)
Culture: NO GROWTH
Culture: NO GROWTH
SPECIAL REQUESTS: ADEQUATE
Special Requests: ADEQUATE

## 2018-01-07 LAB — HEPATITIS B CORE ANTIBODY, IGM: Hep B C IgM: NEGATIVE

## 2018-01-07 LAB — PROTIME-INR
INR: 1.83
PROTHROMBIN TIME: 21 s — AB (ref 11.4–15.2)

## 2018-01-07 LAB — MITOCHONDRIAL ANTIBODIES

## 2018-01-07 LAB — HIV-1 RNA QUANT-NO REFLEX-BLD
HIV 1 RNA Quant: 25500 copies/mL
LOG10 HIV-1 RNA: 4.407 log10copy/mL

## 2018-01-07 LAB — ANTI-SMOOTH MUSCLE ANTIBODY, IGG: F-ACTIN AB IGG: 8 U (ref 0–19)

## 2018-01-07 LAB — PREPARE RBC (CROSSMATCH)

## 2018-01-07 LAB — HEPATIC FUNCTION PANEL
ALT: 48 U/L (ref 14–54)
AST: 181 U/L — AB (ref 15–41)
Albumin: 1.3 g/dL — ABNORMAL LOW (ref 3.5–5.0)
Alkaline Phosphatase: 425 U/L — ABNORMAL HIGH (ref 38–126)
Bilirubin, Direct: 7.4 mg/dL — ABNORMAL HIGH (ref 0.1–0.5)
Indirect Bilirubin: 5.4 mg/dL — ABNORMAL HIGH (ref 0.3–0.9)
TOTAL PROTEIN: 4.2 g/dL — AB (ref 6.5–8.1)
Total Bilirubin: 12.8 mg/dL — ABNORMAL HIGH (ref 0.3–1.2)

## 2018-01-07 LAB — CORTISOL-AM, BLOOD: CORTISOL - AM: 3.6 ug/dL — AB (ref 6.7–22.6)

## 2018-01-07 LAB — HEPATITIS C ANTIBODY: HCV Ab: <0.1 s/co ratio (ref 0.0–0.9)

## 2018-01-07 LAB — ALPHA-1-ANTITRYPSIN: A-1 Antitrypsin, Ser: 191 mg/dL (ref 90–200)

## 2018-01-07 LAB — CERULOPLASMIN: CERULOPLASMIN: 37.1 mg/dL (ref 19.0–39.0)

## 2018-01-07 LAB — LACTIC ACID, PLASMA: Lactic Acid, Venous: 2.4 mmol/L (ref 0.5–1.9)

## 2018-01-07 MED ORDER — IOPAMIDOL (ISOVUE-300) INJECTION 61%
50.0000 mL | Freq: Once | INTRAVENOUS | Status: AC | PRN
  Administered 2018-01-07: 20 mL via INTRAVENOUS

## 2018-01-07 MED ORDER — LIDOCAINE HCL 1 % IJ SOLN
INTRAMUSCULAR | Status: AC | PRN
  Administered 2018-01-07: 5 mL

## 2018-01-07 MED ORDER — FENTANYL CITRATE (PF) 100 MCG/2ML IJ SOLN
INTRAMUSCULAR | Status: AC | PRN
  Administered 2018-01-07: 25 ug via INTRAVENOUS

## 2018-01-07 MED ORDER — FUROSEMIDE 10 MG/ML IJ SOLN
20.0000 mg | Freq: Once | INTRAMUSCULAR | Status: AC
  Administered 2018-01-07: 20 mg via INTRAVENOUS
  Filled 2018-01-07: qty 2

## 2018-01-07 MED ORDER — SODIUM CHLORIDE 0.9 % IV BOLUS (SEPSIS)
1000.0000 mL | Freq: Once | INTRAVENOUS | Status: AC
  Administered 2018-01-07: 1000 mL via INTRAVENOUS

## 2018-01-07 MED ORDER — HYOSCYAMINE SULFATE 0.125 MG SL SUBL
0.1250 mg | SUBLINGUAL_TABLET | Freq: Four times a day (QID) | SUBLINGUAL | Status: DC | PRN
  Administered 2018-01-07: 0.125 mg via SUBLINGUAL
  Filled 2018-01-07: qty 1

## 2018-01-07 MED ORDER — ROMIPLOSTIM 250 MCG ~~LOC~~ SOLR
2.0000 ug/kg | Freq: Once | SUBCUTANEOUS | Status: AC
  Administered 2018-01-07: 115 ug via SUBCUTANEOUS
  Filled 2018-01-07: qty 0.23

## 2018-01-07 MED ORDER — SODIUM CHLORIDE 0.9 % IV SOLN
INTRAVENOUS | Status: AC | PRN
  Administered 2018-01-07: 1000 mL via INTRAVENOUS

## 2018-01-07 MED ORDER — MIDAZOLAM HCL 2 MG/2ML IJ SOLN
INTRAMUSCULAR | Status: AC | PRN
  Administered 2018-01-07: 1 mg via INTRAVENOUS

## 2018-01-07 MED ORDER — SODIUM CHLORIDE 0.9 % IV SOLN
Freq: Once | INTRAVENOUS | Status: AC
  Administered 2018-01-07: 11:00:00 via INTRAVENOUS

## 2018-01-07 MED ORDER — BICTEGRAVIR-EMTRICITAB-TENOFOV 50-200-25 MG PO TABS
1.0000 | ORAL_TABLET | Freq: Every day | ORAL | Status: DC
  Administered 2018-01-07 – 2018-01-12 (×6): 1 via ORAL
  Filled 2018-01-07 (×8): qty 1

## 2018-01-07 MED ORDER — VITAMIN K1 10 MG/ML IJ SOLN
10.0000 mg | Freq: Every day | INTRAMUSCULAR | Status: AC
  Administered 2018-01-07: 10 mg via SUBCUTANEOUS
  Filled 2018-01-07 (×3): qty 1

## 2018-01-07 MED ORDER — FENTANYL CITRATE (PF) 100 MCG/2ML IJ SOLN
INTRAMUSCULAR | Status: AC
  Filled 2018-01-07: qty 2

## 2018-01-07 MED ORDER — SODIUM CHLORIDE 0.9 % IV BOLUS (SEPSIS)
500.0000 mL | Freq: Once | INTRAVENOUS | Status: AC
  Administered 2018-01-07: 500 mL via INTRAVENOUS

## 2018-01-07 MED ORDER — LIDOCAINE HCL 1 % IJ SOLN
INTRAMUSCULAR | Status: AC
  Filled 2018-01-07: qty 20

## 2018-01-07 MED ORDER — SODIUM CHLORIDE 0.9 % IV SOLN
Freq: Once | INTRAVENOUS | Status: AC
  Administered 2018-01-13: 14:00:00 via INTRAVENOUS

## 2018-01-07 MED ORDER — IOPAMIDOL (ISOVUE-300) INJECTION 61%
INTRAVENOUS | Status: AC
  Administered 2018-01-07: 20 mL via INTRAVENOUS
  Filled 2018-01-07: qty 50

## 2018-01-07 MED ORDER — MIDAZOLAM HCL 2 MG/2ML IJ SOLN
INTRAMUSCULAR | Status: AC
  Filled 2018-01-07: qty 2

## 2018-01-07 NOTE — Progress Notes (Signed)
Ms. Finder was moved down to the ICU yesterday.  I was told by the nurse that this is because it she is on a insulin drip.  She is supposed to have a liver biopsy.  I would agree with the liver biopsy just because she may have underlying malignancy (i.e. lymphoma) which is not uncommon in HIV patients with poorly controlled HIV disease.  I will go ahead and give her red blood cells, platelets, FFP, and vitamin K so that a biopsy would be safe.  She is on steroids for the thrombocytopenia.  I will give her a dose of Nplate today.  Her liver tests actually are better.  Her bilirubin is 12.8.  Her platelet count is 23,000.  Her hemoglobin is 6.7.  Her INR was a little bit elevated yesterday.  Again, I think FFP will help with this.  I will also give her some vitamin K.  I still have to believe that a lot of her issues are being driven by poorly controlled HIV.  Her CD 4 count, with no surprise, is only 8.  Again, she has not been taking her HIV medications.  Because she is not HIV control, I worried that she may have an HIV related malignancy that could be affecting her liver.  She has a little bit more appetite today which is nice to see.  Her blood sugars are doing a whole lot better.  She is on Decadron 40 mg a day.  As such, the insulin drip is doing a good job.  On her physical exam, there really is no change from yesterday.  We will continue to follow along and try to help out with her blood counts.  I know that the ICU staff are doing a fantastic job with her.  [name], MD  Romans 5:3-5

## 2018-01-07 NOTE — Progress Notes (Signed)
Name: [name] MRN: [id number] DOB: 12/24/1975    ADMISSION DATE:  12/29/2017 CONSULTATION DATE:  01/07/2018  REFERRING MD :  Florene Glen  CHIEF COMPLAINT:  Abdominal pain  HISTORY OF PRESENT ILLNESS: 42 year old woman with ARDS, current CD4 count 8, admitted 12/5012 with epigastric abdominal pain for 2 weeks.  She had significant weight loss over the past 3 months.  She had a cholecystectomy in November 2018 and review of her labs back been show a high alkaline phosphatase with bilirubin in the 3.7 range. She was found to have a lipase more than 1000 and admitting diagnosis was acute pancreatitis.  She had an MRCP which did not suggest any obstructive etiology. Course was complicated by severe thrombocytopenia and she is being treated empirically as ITP and received his dose of Solu-Medrol on 1/21 and is on dexamethasone on 1/22. Labs on 1/22 show hyperglycemia with an anion gap acidosis and lactate was noted to be 7.5, hence the consult.  Her weight is down to 95 pounds at the time of admission. She has a past medical history of bipolar disorder, asthma and Candida esophagitis.  She also has a history of neutropenia and underwent bone marrow biopsy in 10/2017.  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, AFB fungus cultures were negative and cytogenetics was normal  she admits to poor compliance with her HIV medications   SIGNIFICANT EVENTS    STUDIES:  CT abdomen/pelvis 1/15-diffuse acute pancreatitis, small amount of ascites, chronic retroperitoneal and porta hepatis lymphadenopathy MRCP 1/16 no biliary obstruction  CT chest 10/2017 splenomegaly, enlarged periaortic and borderline enlarged mediastinal and right hilar lymph nodes  CT abdomen 1/22 shows ascites and bilateral effusions, splenic vein thrombosis and changes of pancreatitis  SUBJECTIVE:  Afebrile, continues to have mild abdominal pain. One loose bowel movement Looks  better, able to get out of bed and sit in a chair  VITAL SIGNS: Temp:  [97.3 F (36.3 C)-98.7 F (37.1 C)] 97.6 F (36.4 C) (01/23 1108) Pulse Rate:  [83] 83 (01/22 1243) Resp:  [16-17] 17 (01/23 0200) BP: (132-138)/(86-99) 138/99 (01/23 1108) SpO2:  [98 %] 98 % (01/22 1243) Weight:  [126 lb 1.7 oz (57.2 kg)] 126 lb 1.7 oz (57.2 kg) (01/23 0401)  PHYSICAL EXAMINATION: Gen. Pleasant, tall, thin, in no distress, anxious/depressed affect ENT -icterus, no post nasal drip Neck: No JVD, no thyromegaly, no carotid bruits Lungs: no use of accessory muscles, no dullness to percussion, clear without rales or rhonchi  Cardiovascular: Rhythm regular, heart sounds  normal, no murmurs or gallops, no peripheral edema Abdomen: Mild tenderness epigastrium and right hypochondrium , no hepatosplenomegaly, BS normal. Musculoskeletal: No deformities, no cyanosis or clubbing Neuro:  alert, non focal   Recent Labs  Lab 01/07/18 0024 01/07/18 0431 01/07/18 0837  NA 135 136 135  K 3.7 3.5 3.4*  CL 112* 114* 111  CO2 17* 19* 17*  BUN 7 7 8   CREATININE <0.30* <0.30* <0.30*  GLUCOSE 137* 134* 150*   Recent Labs  Lab 01/05/18 1316 01/06/18 0557 01/07/18 0431  HGB 8.0* 7.6* 6.7*  HCT 23.8* 22.1* 19.2*  WBC 3.1* 2.9* 3.4*  PLT 15* 19* 23*   Ct Abdomen Pelvis W Contrast  Result Date: 01/06/2018 CLINICAL DATA:  Abdominal distension with elevated lactate EXAM: CT ABDOMEN AND PELVIS WITH CONTRAST TECHNIQUE: Multidetector CT imaging of the abdomen and pelvis was performed using the standard protocol following bolus administration of intravenous contrast. CONTRAST:  125m ISOVUE-300 IOPAMIDOL (  ISOVUE-300) INJECTION 61% COMPARISON:  MRI 12/31/2017, CT abdomen pelvis 12/30/2017, 10/23/2017, 10/21/2017, 10/13/2017, 10/16/2016 FINDINGS: Lower chest: Lung bases demonstrate moderate pleural effusions, increased compared to prior. Partial consolidation in the lower lobes, atelectasis versus pneumonia. Mild  cardiomegaly. Mild distal esophageal and GE junction thickening. Hepatobiliary: No focal hepatic abnormality. Surgical clips at the gallbladder fossa. No biliary dilatation Pancreas: Decreased pancreatic edema.  No ductal dilatation Spleen: Interval enlargement of the spleen now measuring 14 cm on coronal views. Adrenals/Urinary Tract: Adrenal glands are unremarkable. Kidneys are normal, without renal calculi, focal lesion, or hydronephrosis. Bladder is unremarkable. Stomach/Bowel: Stomach is unremarkable. Fluid-filled loops of small bowel within the abdomen. No significant wall thickening. No intramural air. Normal appendix. No colon wall thickening. Vascular/Lymphatic: Nonaneurysmal aorta. Patent portal vein. Non enhancement of the splenic vein consistent with occlusion, new since the comparison CT. Enlarged retroperitoneal lymph nodes as before. Reproductive: Uterus and bilateral adnexa are unremarkable. Other: Large volume of ascites, increased compared to prior. No free air. Musculoskeletal: Degenerative changes. No acute or suspicious abnormality. IMPRESSION: 1. Moderate bilateral pleural effusions, increased compared to prior. Partial consolidations in the bilateral lower lobes may reflect atelectasis or pneumonia 2. 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 Portal vein remains patent. Interval enlargement of the spleen. 3. Decreased pancreatic edema. Large volume of ascites in the abdomen and pelvis, increased compared to prior. 4. Fluid-filled loops of central small bowel, possible ileus. No intramural air or portal venous gas. 5. Stable retroperitoneal lymphadenopathy Electronically Signed   By: Donavan Foil M.D.   On: 01/06/2018 23:28    ASSESSMENT / PLAN:  Lactic acidosis  -resolved, unexplained etiology, no evidence of compartment syndrome, or  ischemic bowel.  She did not have hypotension or hypoxia during this admit. Wonder if this is  related to medications or other D lactic acidosis Does not seem to be related to sepsis -Definitely low clearance due to poor liver function  Hyperglycemia-likely steroid-induced, low beta hydroxybutyrate rules out DKA   Cholestatic liver disease -no evidence of obstructive jaundice, differential diagnosis here again includes medication, HIV cholangiopathy, MAC or disseminated CMV -ongoing workup by ID/ GI , liver biopsy plan -this may be a challenge due to thrombocytopenia   Thrombocytopenia-  being treated as ITP, negative bone marrow biopsy in 10/2017  Low serum cortisol-difficult to interpret since she just received IV steroids dexamethasone generally does not interfere with the test.. Would suggest repeating this after acute issues resolve  P CCM available as needed, can transfer   back to floor  Kara Mead MD. FCCP. [facility] Pager [phone] If no response call [phone]    01/07/2018, 11:41 AM

## 2018-01-07 NOTE — Progress Notes (Signed)
Date: January 07, 2018 Velva Harman, BSN, [place], [place] Chart and notes review for patient progress and needs./ transferred to icu for iv insulin drip, increased lethargy. Will follow for case management and discharge needs. No cm or discharge needs present at time of this review. Next review date: [id number]

## 2018-01-07 NOTE — Sedation Documentation (Signed)
Hepatic pressures:  Wedge (9), Free (1)

## 2018-01-07 NOTE — Procedures (Signed)
  Pre-operative Diagnosis: Hyperbilirubinemia, possible intrahepatic cholestasis       Post-operative Diagnosis: Hyperbilirubinemia, possible intrahepatic cholestasis   Indications: Abnormal liver enzymes.    Procedure: Transjugular liver biopsy and hepatic venography with pressures  Findings: Hepatic vein gradient was elevated, measuring 7 mmHg.  5 cores performed and 4 adequate cores obtained.  3 cores placed in formalin and 1 core placed in saline for microbiology  Complications: None     EBL: Minimal  Plan: Bedrest tonight.

## 2018-01-07 NOTE — Progress Notes (Addendum)
[name] for Infectious Disease    Date of Admission:  12/29/2017   Total days of antibiotics 9  ID: [name] is a 42 y.o. female with advanced hiv disease, severe protein malnutrition, admitted for pancreatitis but also found to have eso candidiasis, esophageal stricture. Course complicated by thrombocytopenia thought to be 2/2 ITP, plus also have transaminitis, hyperbilirubinemia with elevated INR concerning for liver failure Principal Problem:   Pancreatitis Active Problems:   HIV disease ([place])   Bipolar disorder ([place])   Hypokalemia   Esophageal stricture   Dysphagia   Candidal esophagitis ([facility])   Severe protein-calorie malnutrition ([facility])   Abdominal pain   Lactic acidosis    Subjective: Remains afebrile, feels weak, but better than yesterday. Her abdomen is less distended. She tolerated doing her biopsy  Medications:  . bictegravir-emtricitabine-tenofovir AF  1 tablet Oral Daily  . dexamethasone  40 mg Oral Daily  . ethambutol  15 mg/kg Oral Daily  . feeding supplement  1 Container Oral TID BM  . folic acid  2 mg Oral Daily  . insulin aspart  0-9 Units Subcutaneous TID WC  . iopamidol  30 mL Oral Once  . multivitamin with minerals  1 tablet Oral Daily  . nystatin  5 mL Oral QID  . pantoprazole (PROTONIX) IV  40 mg Intravenous Q12H  . phytonadione  10 mg Subcutaneous Daily  . sucralfate  1 g Oral TID WC & HS    Objective: Vital signs in last 24 hours: Temp:  [97.3 F (36.3 C)-97.6 F (36.4 C)] 97.3 F (36.3 C) (01/23 1442) Resp:  [17] 17 (01/23 0200) BP: (134-151)/(93-99) 151/96 (01/23 1442) Weight:  [126 lb 1.7 oz (57.2 kg)] 126 lb 1.7 oz (57.2 kg) (01/23 0401)  Physical Exam  Constitutional:  oriented to person, place, and time. appears cachetic, chronically ill No distress.  HENT: [place]/AT, PERRLA,+scleral icterus - but less marked Mouth/Throat: Oropharynx is clear and moist. No oropharyngeal exudate.  Neck:right sided bandage from  bix Cardiovascular: Normal rate, regular rhythm and normal heart sounds. Exam reveals no gallop and no friction rub.  No murmur heard.  Pulmonary/Chest: Effort normal and breath sounds normal. No respiratory distress.  has no wheezes.  Abdominal: Soft. Bowel sounds are normal.  exhibits no distension. There is no tenderness.  Neurological: alert and oriented to person, place, and time.  Skin: Skin is warm and dry. No rash noted. No erythema.  Psychiatric: a normal mood and affect.  behavior is normal.     Lab Results Recent Labs    01/06/18 0557  01/07/18 0431 01/07/18 0837  WBC 2.9*  --  3.4*  --   HGB 7.6*  --  6.7*  --   HCT 22.1*  --  19.2*  --   NA 132*   < > 136 135  K 3.4*   < > 3.5 3.4*  CL 106   < > 114* 111  CO2 18*   < > 19* 17*  BUN 12   < > 7 8  CREATININE 0.36*   < > <0.30* <0.30*   < > = values in this interval not displayed.   Liver Panel Recent Labs    01/06/18 0557 01/07/18 0431  PROT 4.5* 4.2*  ALBUMIN 1.5* 1.3*  AST 205* 181*  ALT 45 48  ALKPHOS 515* 425*  BILITOT 17.0* 12.8*  BILIDIR 12.0* 7.4*  IBILI 6.7* 5.4*    Microbiology: reviewed Studies/Results: Ct Abdomen Pelvis W Contrast  Result Date: 01/06/2018 CLINICAL DATA:  Abdominal distension with elevated lactate EXAM: CT ABDOMEN AND PELVIS WITH CONTRAST TECHNIQUE: Multidetector CT imaging of the abdomen and pelvis was performed using the standard protocol following bolus administration of intravenous contrast. CONTRAST:  159m ISOVUE-300 IOPAMIDOL (ISOVUE-300) INJECTION 61% COMPARISON:  MRI 12/31/2017, CT abdomen pelvis 12/30/2017, 10/23/2017, 10/21/2017, 10/13/2017, 10/16/2016 FINDINGS: Lower chest: Lung bases demonstrate moderate pleural effusions, increased compared to prior. Partial consolidation in the lower lobes, atelectasis versus pneumonia. Mild cardiomegaly. Mild distal esophageal and GE junction thickening. Hepatobiliary: No focal hepatic abnormality. Surgical clips at the gallbladder  fossa. No biliary dilatation Pancreas: Decreased pancreatic edema.  No ductal dilatation Spleen: Interval enlargement of the spleen now measuring 14 cm on coronal views. Adrenals/Urinary Tract: Adrenal glands are unremarkable. Kidneys are normal, without renal calculi, focal lesion, or hydronephrosis. Bladder is unremarkable. Stomach/Bowel: Stomach is unremarkable. Fluid-filled loops of small bowel within the abdomen. No significant wall thickening. No intramural air. Normal appendix. No colon wall thickening. Vascular/Lymphatic: Nonaneurysmal aorta. Patent portal vein. Non enhancement of the splenic vein consistent with occlusion, new since the comparison CT. Enlarged retroperitoneal lymph nodes as before. Reproductive: Uterus and bilateral adnexa are unremarkable. Other: Large volume of ascites, increased compared to prior. No free air. Musculoskeletal: Degenerative changes. No acute or suspicious abnormality. IMPRESSION: 1. Moderate bilateral pleural effusions, increased compared to prior. Partial consolidations in the bilateral lower lobes may reflect atelectasis or pneumonia 2. 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 Portal vein remains patent. Interval enlargement of the spleen. 3. Decreased pancreatic edema. Large volume of ascites in the abdomen and pelvis, increased compared to prior. 4. Fluid-filled loops of central small bowel, possible ileus. No intramural air or portal venous gas. 5. Stable retroperitoneal lymphadenopathy Electronically Signed   By: KDonavan FoilM.D.   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 Appreciate dr eAntonieta Pertmanagement. Getting nplate today as well. Ultimately if she gets her platelets back to her baseline, she would then be able to undergo esophageal dilatation  Liver failure with hyperbili/transaminitis/elevated coags = improved down to tbili to 12 today, and  INR slightly improved. Unclear cause. Agree with getting liver biopsy. Tissue cx and path has been sent this evening. Auto-immune work up has been sent off. Possibly could be due to MAC, though she doesn't have features of profuse diarrhea, or fevers, not a clear picture, she has been empirically started on MAC treatment for time being.  Lactic acidosis = improved today, unclear etiology. It would be unusual that it is caused by this HIV regimen. Will continue with biktarvy if she has worsening LA then we will stop.  HIV disease= poorly controlled, currently on biktarvy. We had stopped dapsone due to concern impacting hyperbili/transaminitis. She is on empiric treatment with azithromycin and ethambutol incase it was the cause of her liver abn. Awaiting path results to decide to continue. Also had AFB blood cx that is still pending. At most would treat for 2 wk to see what results if blood cx becomes positive.  eso candidiasis = continue with anidulafungin  eso stricture = defer to GI to when she would be a candidate for dilatation.  Overall prognosis is guarded, though improved today    [facility] Cell: [phone]: [phone]  01/07/2018, 2:50 PM

## 2018-01-07 NOTE — Progress Notes (Addendum)
[facility] Progress Note  [name] 42 y.o. [birth year]  CC:  Pancreatitis, Candida esophagitis, abnormal LFTs   Subjective: Patient continues to have abdominal pain but feeling somewhat better. A 2-3 bowel movement yesterday. Denied any blood in stool. Tolerating diet.  ROS : Positive for weakness and fatigue. Negative for active chest pain.   Objective: Vital signs in last 24 hours: Vitals:   01/07/18 0200 01/07/18 0359  BP: (!) 134/93   Pulse:    Resp: 17   Temp:  (!) 97.3 F (36.3 C)  SpO2:      Physical Exam:  General:  Thin and frail-appearing patient, appears jaundiced   Head:  Normocephalic, without obvious abnormality, atraumatic  Eyes:  Scleral icterus noted   Lungs:   Decreased breath sounds bilaterally. No respiratory distress   Heart:  Regular rate and rhythm, S1, S2 normal  Abdomen:   Tenderness epigastric discomfort with epigastric tenderness to palpation, bowel sounds present, no peritoneal signs.   Extremities: Extremities normal, atraumatic, no  edema       Lab Results: Recent Labs    01/06/18 0557  01/07/18 0431 01/07/18 0837  NA 132*   < > 136 135  K 3.4*   < > 3.5 3.4*  CL 106   < > 114* 111  CO2 18*   < > 19* 17*  GLUCOSE 408*   < > 134* 150*  BUN 12   < > 7 8  CREATININE 0.36*   < > <0.30* <0.30*  CALCIUM 8.0*   < > 7.4* 7.4*  MG 1.6*  --   --   --    < > = values in this interval not displayed.   Recent Labs    01/06/18 0557 01/07/18 0431  AST 205* 181*  ALT 45 48  ALKPHOS 515* 425*  BILITOT 17.0* 12.8*  PROT 4.5* 4.2*  ALBUMIN 1.5* 1.3*   Recent Labs    01/06/18 0557 01/07/18 0431  WBC 2.9* 3.4*  NEUTROABS 2.4 3.1  HGB 7.6* 6.7*  HCT 22.1* 19.2*  MCV 75.9* 74.1*  PLT 19* 23*   Recent Labs    01/06/18 0557 01/07/18 0431  LABPROT 22.7* 21.0*  INR 2.02 1.83      Assessment/Plan: - Candida esophagitis with severe esophagitis - on anidulafunging - Abdominal pain. Probably from pancreatitis.  Improving.  - Abnormal LFTs. Improving. Differential diagnoses include HIV cholangiopathy versus medication induced -  ThromboCytopenia. 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 Patent portal vein - Pleural effusion with ascites.   Recommendations -------------------------- - LFTs improving. Abdominal pain also improving. - CT abdomen pelvis with contrast yesterday showed decreased pancreatic edema but worsening ascites and moderate bilateral pleural effusion. - Follow CBC and CMP - No  biliary obstruction seen on MRI which was done on 12/31/2017 - Continue current management - Hepatitis C antibody as well as hepatitis B core IgM negative. Normal alpha-1 antitrypsin level as well as normal ceruloplasmin. AMA and ASMA pending . Liver biopsy pending  - GI will follow  Otis Brace MD, [place] 01/07/2018, 10:50 AM  Contact #  [phone]

## 2018-01-07 NOTE — Progress Notes (Signed)
PT Cancellation Note  Patient Details Name: [name] MRN: [id number] DOB: 11-16-1976   Cancelled Treatment:    Reason Eval/Treat Not Completed: Medical issues which prohibited therapy, to get RBCs, moved to SDU.   Claretha Cooper 01/07/2018, 7:23 AM Tresa Endo PT [phone]

## 2018-01-07 NOTE — Progress Notes (Addendum)
PROGRESS NOTE  [name]:[id number] DOB: 01/16/1976 DOA: 12/29/2017 PCP: Nolene Ebbs, MD  HPI/Recap of past 24 hours:  [name] is a 42 y.o. 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    This morning, is of some mild abdominal pain overnight.  Otherwise has no acute complaints.  Assessment/Plan: Principal Problem:   Pancreatitis Active Problems:   HIV disease ([place])   Bipolar disorder ([place])   Hypokalemia   Esophageal stricture   Dysphagia   Candidal esophagitis ([facility])   Severe protein-calorie malnutrition ([facility])   Abdominal pain   Lactic acidosis      Acute pancreatitis, unclear etiology.  Initial lipase greater than 1000 on admission.  MRCP showed no obstructive etiology.  Triglycerides of 400, ANC numbers greater than 1000 that contribute to significant pancreatitis.  Possibly related to HIV pathology.  Additionally possible dapsone could contribute to pink otitis/cholestasis.  Continue supportive care    Transaminitis, hyperbilirubinemia, elevated INR, concerning for liver failure, unclear etiology, improving. Status post liver biopsy on 01/07/18, follow histopathology.  Empirically started on MAC treatment per ID on 1/22 though presentation not entirely consistent (no diarrhea).  Dapsone and Bactrim has been discontinued in hospital course with concern it may be worsening liver synthetic function.  Autoimmune workup unrevealing so far (normal alpha-1 antitrypsin level, negative hepatitis C antibody, negative hepatitis B antibody, normal ceruloplasmin)  pending antimitochondrial antibody and anti-smooth muscle antibody with GI following.  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  Given dose of Npl by oncology today prior to the liver biopsy. Continue Decadron  Lactic acidosis, unclear etiology, resolved.  No ischemic bowel, no hypotension or hypoxia since admission.  Peak 7.4 improved to 2.4 considered D lactic acidosis in the setting of poor clearance due to poor liver function. Temporarily held biktarvy. PCCM  previously consulted.  Temporarily on insulin drip for concern of hypoglycemia/possible DKA however has since been discontinued with no ketones in urine and without significant hypoglycemia  HIV, poorly controlled (CD4 count 8).  Continuing empiric MAC treatment with azithromycin ethambutol.  Follow-up AFB blood culture as we await path results from liver biopsy.  Continuing biktarvy  Esophageal candidiasis, stable.  Continue Anidulafungin, PPI and [name].  CMV stain was negative  Esophageal stricture. Dilation deferred in the setting of current thrombocytopenia  Steroid-induced hyperglycemia, stable.  Correction mealtime insulin.  Continue to monitor blood glucose levels   Code Status: Full code  Family Communication: No family at bedside  Disposition Plan: Pending liver biopsy   Consultants: Infectious disease, PCCM, gastroenterology, Oncology Procedures:  Liver biopsy 01/07/18  EEG on 01/06/18 mild diffuse slowing of cerebral activity nonspecific state (depression.  Toxic, metabolic, degenerative etiologies) no epileptiform activity recorded.  Antimicrobials:  Anidulafungin 1/21-1/22  Azithromycin 1/22>>  Ethambutol 1/23>>  Cefepime 1/18  Dapsone 1/17- 1/19  Fluconazole 1/15-1/19  Vancomycin 1/18  Cultures: Blood cultures x2, negative up-to-date  (01/02/18) Urine culture 01/02/18, staph, coagulase negative, 40,000 colonies Esophageal biopsy 01/02/18, CMV negative Acid fast 1/20 1/19 x 2 negative growth to date Acid fast and cultures pending  from liver biopsy on 01/07/18  DVT prophylaxis: SCDs   Objective: Vitals:   01/07/18 1830 01/07/18 1900 01/07/18 2000 01/07/18 2013  BP: (!) 157/96 (!) 159/88 (!) 135/98   Pulse: (!) 52 (!) 58 76   Resp: 12 (!) 21 (!) 23   Temp:    (!) 97.5 F (36.4 C)  TempSrc:    Oral  SpO2:  96% 95%   Weight:      Height:        Intake/Output Summary (Last 24 hours) at 01/07/2018 2044 Last data filed at 01/07/2018 1549 Gross per 24 hour  Intake 654 ml  Output 1200 ml  Net -546 ml   Filed Weights   01/05/18 0504 01/06/18 0659 01/07/18 0401  Weight: 51.3 kg (113 lb 1.5 oz) 55.2 kg (121 lb 11.1 oz) 57.2 kg (126 lb 1.7 oz)    Exam:  General: Lying in bed with eyes closed, in no apparent distres Cardiovascular: regular rate and rhythm, no murmurs, rubs or gallops, , no edema Respiratory: Normal respiratory effort, lungs clear to auscultation bilaterally, no rales, wheezes or rhonchi Abdomen: soft, non-distended, generalized tenderness throughout, no guarding, rebound tenderness skin: No Rash Neurologic: Grossly no focal neuro deficit.Mental status AAOx3, Psychiatric: Flat affect  Data Reviewed: CBC: Recent Labs  Lab 01/02/18 1740 01/03/18 0625 01/04/18 0534  01/04/18 2256 01/05/18 0527 01/05/18 1316 01/06/18 0557 01/07/18 0431  WBC 1.8* 1.9* 2.3*  --   --  2.8* 3.1* 2.9* 3.4*  NEUTROABS 1.5* 1.5*  --   --   --   --   --  2.4 3.1  HGB 7.5* 7.9* 6.9*   < > 9.5* 7.7* 8.0* 7.6* 6.7*  HCT 21.6* 23.3* 19.9*   < > 27.7* 22.9* 23.8* 22.1* 19.2*  MCV 77.1* 76.4* 77.1*  --   --  74.8* 74.8* 75.9* 74.1*  PLT 23* 24* 24*  --   --  9* 15* 19* 23*   < > = values in this interval not displayed.   Basic Metabolic Panel: Recent Labs  Lab 01/01/18 0534  01/06/18 0557  01/06/18 2117  01/07/18 0024 01/07/18 0431 01/07/18 0837 01/07/18 1943  NA 141   < > 132*   < > 132* 135 136 135 134*  K 3.7   < > 3.4*   < > 3.4* 3.7 3.5 3.4* 3.2*  CL 117*   < > 106   < > 109 112* 114* 111 108  CO2 19*   < > 18*   < > 14* 17* 19* 17* 16*  GLUCOSE 86   < > 408*   < > 246* 137* 134* 150* 293*  BUN 9   < > 12   < > _0 CREATININE 0.46   < > 0.36*   < > <0.30* <0.30* <0.30* <0.30* <0.30*  CALCIUM 8.4*   < > 8.0*   < > 7.8* 7.6* 7.4* 7.4* 7.8*  MG 1.8  --  1.6*  --   --   --   --   --   --    < > = values in this interval not displayed.   GFR: CrCl cannot be calculated (This lab value cannot be used to calculate CrCl because it is not a number: <0.30). Liver Function Tests: Recent Labs  Lab 01/03/18 0625 01/04/18 0534 01/05/18 0527 01/06/18 0557 01/07/18 0431  AST 213* 236* 234* 205* 181*  ALT 35 37 40 45 48  ALKPHOS 688* 570*  510* 515* 425*  BILITOT 13.4* 14.8* 16.7* 17.0* 12.8*  PROT 5.1* 4.6* 4.6* 4.5* 4.2*  ALBUMIN 1.8* 1.5* 1.5* 1.5* 1.3*   Recent Labs  Lab 01/01/18 0534 01/02/18 0725 01/03/18 0619  LIPASE 208* 86* 65*   Recent Labs  Lab 01/05/18 0901  AMMONIA 43*   Coagulation Profile: Recent Labs  Lab 01/01/18 1522 01/02/18 0507 01/04/18 0534 01/06/18 0557 01/07/18 0431  INR 1.29 1.31 1.38 2.02 1.83   Cardiac Enzymes: No results for input(s): CKTOTAL, CKMB, CKMBINDEX, TROPONINI in the last 168 hours. BNP (last 3 results) No results for input(s): PROBNP in the last 8760 hours. HbA1C: No results for input(s): HGBA1C in the last 72 hours. CBG: Recent Labs  Lab 01/06/18 2317 01/07/18 0325 01/07/18 0805 01/07/18 1300 01/07/18 1907  GLUCAP 150* 125* 149* 209* 150*   Lipid Profile: No results for input(s): CHOL, HDL, LDLCALC, TRIG, CHOLHDL, LDLDIRECT in the last 72 hours. Thyroid Function Tests: No results for input(s): TSH, T4TOTAL, FREET4, T3FREE, THYROIDAB in the last 72 hours. Anemia Panel: Recent Labs    01/06/18 0557   RETICCTPCT 2.1   Urine analysis:    Component Value Date/Time   COLORURINE AMBER (A) 01/06/2018 1702   APPEARANCEUR HAZY (A) 01/06/2018 1702   LABSPEC 1.017 01/06/2018 1702   PHURINE 6.0 01/06/2018 1702   GLUCOSEU >=500 (A) 01/06/2018 1702   HGBUR SMALL (A) 01/06/2018 1702   BILIRUBINUR MODERATE (A) 01/06/2018 1702   KETONESUR NEGATIVE 01/06/2018 1702   PROTEINUR NEGATIVE 01/06/2018 1702   UROBILINOGEN 0.2 12/07/2014 1441   NITRITE NEGATIVE 01/06/2018 1702   LEUKOCYTESUR NEGATIVE 01/06/2018 1702   Sepsis Labs: _0 (procalcitonin:4,lacticidven:4)  ) Recent Results (from the past 240 hour(s))  Culture, blood (routine x 2)     Status: None   Collection Time: 01/02/18  7:24 AM  Result Value Ref Range Status   Specimen Description BLOOD LEFT HAND  Final   Special Requests IN PEDIATRIC BOTTLE Blood Culture adequate volume  Final   Culture   Final    NO GROWTH 5 DAYS Performed at [facility], [place] [address] [place], [place] [address]    Report Status 01/07/2018 FINAL  Final  Culture, blood (routine x 2)     Status: None   Collection Time: 01/02/18  7:25 AM  Result Value Ref Range Status   Specimen Description BLOOD RIGHT ANTECUBITAL  Final   Special Requests   Final    BOTTLES DRAWN AEROBIC AND ANAEROBIC Blood Culture adequate volume   Culture   Final    NO GROWTH 5 DAYS Performed at [facility], [place] [address] [place], [place] [address]    Report Status 01/07/2018 FINAL  Final  Cytomegalovirus (CMV) Culture     Status: None   Collection Time: 01/02/18  8:50 AM  Result Value Ref Range Status   Cytomegalovirus (CMV) Culture Comment  Final    Comment: (NOTE) No Cytomegalovirus detected in the shell vial culture. Conventional tissue culture results to follow. Performed At: [facility] [place], [place] [id number] Rush Farmer MD DP:[id number]    Source of Sample ESOPHAGUS  Final    Comment: BIOSPY  Respiratory Panel by  PCR     Status: None   Collection Time: 01/02/18 10:42 AM  Result Value Ref Range Status   Adenovirus NOT DETECTED NOT DETECTED Final   Coronavirus 229E NOT DETECTED NOT DETECTED Final   Coronavirus HKU1 NOT DETECTED NOT DETECTED Final   Coronavirus NL63 NOT DETECTED  NOT DETECTED Final   Coronavirus OC43 NOT DETECTED NOT DETECTED Final   Metapneumovirus NOT DETECTED NOT DETECTED Final   Rhinovirus / Enterovirus NOT DETECTED NOT DETECTED Final   Influenza A NOT DETECTED NOT DETECTED Final   Influenza B NOT DETECTED NOT DETECTED Final   Parainfluenza Virus 1 NOT DETECTED NOT DETECTED Final   Parainfluenza Virus 2 NOT DETECTED NOT DETECTED Final   Parainfluenza Virus 3 NOT DETECTED NOT DETECTED Final   Parainfluenza Virus 4 NOT DETECTED NOT DETECTED Final   Respiratory Syncytial Virus NOT DETECTED NOT DETECTED Final   Bordetella pertussis NOT DETECTED NOT DETECTED Final   Chlamydophila pneumoniae NOT DETECTED NOT DETECTED Final   Mycoplasma pneumoniae NOT DETECTED NOT DETECTED Final  Culture, Urine     Status: Abnormal   Collection Time: 01/02/18 12:41 PM  Result Value Ref Range Status   Specimen Description URINE, CLEAN CATCH  Final   Special Requests NONE  Final   Culture (A)  Final    40,000 COLONIES/mL STAPHYLOCOCCUS SPECIES (COAGULASE NEGATIVE)   Report Status 01/04/2018 FINAL  Final   Organism ID, Bacteria STAPHYLOCOCCUS SPECIES (COAGULASE NEGATIVE) (A)  Final      Susceptibility   Staphylococcus species (coagulase negative) - MIC*    CIPROFLOXACIN <=0.5 SENSITIVE Sensitive     GENTAMICIN <=0.5 SENSITIVE Sensitive     NITROFURANTOIN <=16 SENSITIVE Sensitive     OXACILLIN >=4 RESISTANT Resistant     TETRACYCLINE >=16 RESISTANT Resistant     VANCOMYCIN 1 SENSITIVE Sensitive     TRIMETH/SULFA <=10 SENSITIVE Sensitive     CLINDAMYCIN RESISTANT Resistant     RIFAMPIN <=0.5 SENSITIVE Sensitive     Inducible Clindamycin POSITIVE Resistant     * 40,000 COLONIES/mL STAPHYLOCOCCUS  SPECIES (COAGULASE NEGATIVE)  MRSA PCR Screening     Status: None   Collection Time: 01/06/18  6:59 PM  Result Value Ref Range Status   MRSA by PCR NEGATIVE NEGATIVE Final    Comment:        The GeneXpert MRSA Assay (FDA approved for NASAL specimens only), is one component of a comprehensive MRSA colonization surveillance program. It is not intended to diagnose MRSA infection nor to guide or monitor treatment for MRSA infections.       Studies: Ct Abdomen Pelvis W Contrast  Result Date: 01/06/2018 CLINICAL DATA:  Abdominal distension with elevated lactate EXAM: CT ABDOMEN AND PELVIS WITH CONTRAST TECHNIQUE: Multidetector CT imaging of the abdomen and pelvis was performed using the standard protocol following bolus administration of intravenous contrast. CONTRAST:  119m ISOVUE-300 IOPAMIDOL (ISOVUE-300) INJECTION 61% COMPARISON:  MRI 12/31/2017, CT abdomen pelvis 12/30/2017, 10/23/2017, 10/21/2017, 10/13/2017, 10/16/2016 FINDINGS: Lower chest: Lung bases demonstrate moderate pleural effusions, increased compared to prior. Partial consolidation in the lower lobes, atelectasis versus pneumonia. Mild cardiomegaly. Mild distal esophageal and GE junction thickening. Hepatobiliary: No focal hepatic abnormality. Surgical clips at the gallbladder fossa. No biliary dilatation Pancreas: Decreased pancreatic edema.  No ductal dilatation Spleen: Interval enlargement of the spleen now measuring 14 cm on coronal views. Adrenals/Urinary Tract: Adrenal glands are unremarkable. Kidneys are normal, without renal calculi, focal lesion, or hydronephrosis. Bladder is unremarkable. Stomach/Bowel: Stomach is unremarkable. Fluid-filled loops of small bowel within the abdomen. No significant wall thickening. No intramural air. Normal appendix. No colon wall thickening. Vascular/Lymphatic: Nonaneurysmal aorta. Patent portal vein. Non enhancement of the splenic vein consistent with occlusion, new since the comparison CT.  Enlarged retroperitoneal lymph nodes as before. Reproductive: Uterus and bilateral adnexa are unremarkable. Other: Large  volume of ascites, increased compared to prior. No free air. Musculoskeletal: Degenerative changes. No acute or suspicious abnormality. IMPRESSION: 1. Moderate bilateral pleural effusions, increased compared to prior. Partial consolidations in the bilateral lower lobes may reflect atelectasis or pneumonia 2. 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 Portal vein remains patent. Interval enlargement of the spleen. 3. Decreased pancreatic edema. Large volume of ascites in the abdomen and pelvis, increased compared to prior. 4. Fluid-filled loops of central small bowel, possible ileus. No intramural air or portal venous gas. 5. Stable retroperitoneal lymphadenopathy Electronically Signed   By: Donavan Foil M.D.   On: 01/06/2018 23:28   Ir Venogram Hepatic W Hemodynamic Evaluation  Result Date: 01/07/2018 INDICATION: 42 year old female with a complex medical history including markedly abnormal liver enzymes. Request for liver biopsy. Patient has thrombocytopenia and received platelets prior to the procedure. EXAM: TRANSJUGULAR LIVER BIOPSY WITH FLUOROSCOPY HEPATIC VENOGRAPHY WITH PRESSURES ULTRASOUND GUIDANCE FOR VASCULAR ACCESS MEDICATIONS: None. ANESTHESIA/SEDATION: Moderate (conscious) sedation was employed during this procedure. A total of Versed 1 mg and Fentanyl 25 mcg was administered intravenously. Moderate Sedation Time: 41 minutes. The patient's level of consciousness and vital signs were monitored continuously by radiology nursing throughout the procedure under my direct supervision. FLUOROSCOPY TIME:  Fluoroscopy Time: 7 minutes and 54 seconds. 161 mGy COMPLICATIONS: None immediate. PROCEDURE: Informed written consent was obtained from the patient after a thorough discussion of the procedural risks, benefits and alternatives. All  questions were addressed. Maximal Sterile Barrier Technique was utilized including caps, mask, sterile gowns, sterile gloves, sterile drape, hand hygiene and skin antiseptic. A timeout was performed prior to the initiation of the procedure. Ultrasound confirmed a patent right internal jugular vein. The right side of the neck was prepped and draped in a sterile fashion. Skin was anesthetized with 1% lidocaine. 21 gauge needle directed into the right internal jugular vein with ultrasound guidance. Micropuncture dilator set was placed. Five French catheter was advanced into the IVC and a Bentson wire was placed. The tract was dilated to accommodate a 10 [place] sheath. 5 French catheter was advanced into a right hepatic vein. Hepatic venography was performed. Wedged and free hepatic vein pressures were obtained. The transjugular biopsy kit was then advanced through the sheath over a stiff Amplatz wire. Total of 5 core biopsies were performed. Four adequate core biopsies were obtained. Three specimens placed in formalin for pathology. One specimen was placed in saline for microbiology. Follow-up venography was performed through the 5 French catheter at of the procedure. Sheath were removed with manual compression. Fluoroscopic and ultrasound images were taken and saved for documentation. FINDINGS: Two sets of hepatic venous pressures were obtained. The mean gradient on the first set of pressures was 7. The gradient on the second set of pressures was 6-7. Findings are suggestive for an elevated hepatic venous pressure gradient and concerning for portal hypertension. Core biopsies obtained from the right hepatic lobe. Four adequate specimens were obtained. No significant bleeding or extravasation on the final hepatic venography. IMPRESSION: Successful transjugular liver biopsy. Specimens sent to pathology and microbiology. Elevated hepatic venous pressure gradient. Electronically Signed   By: Markus Daft M.D.   On:  01/07/2018 18:17   Ir Transcatheter Bx  Result Date: 01/07/2018 INDICATION: 42 year old female with a complex medical history including markedly abnormal liver enzymes. Request for liver biopsy. Patient has thrombocytopenia and received platelets prior to the procedure. EXAM: TRANSJUGULAR LIVER BIOPSY WITH FLUOROSCOPY HEPATIC VENOGRAPHY WITH PRESSURES ULTRASOUND  GUIDANCE FOR VASCULAR ACCESS MEDICATIONS: None. ANESTHESIA/SEDATION: Moderate (conscious) sedation was employed during this procedure. A total of Versed 1 mg and Fentanyl 25 mcg was administered intravenously. Moderate Sedation Time: 41 minutes. The patient's level of consciousness and vital signs were monitored continuously by radiology nursing throughout the procedure under my direct supervision. FLUOROSCOPY TIME:  Fluoroscopy Time: 7 minutes and 54 seconds. 765 mGy COMPLICATIONS: None immediate. PROCEDURE: Informed written consent was obtained from the patient after a thorough discussion of the procedural risks, benefits and alternatives. All questions were addressed. Maximal Sterile Barrier Technique was utilized including caps, mask, sterile gowns, sterile gloves, sterile drape, hand hygiene and skin antiseptic. A timeout was performed prior to the initiation of the procedure. Ultrasound confirmed a patent right internal jugular vein. The right side of the neck was prepped and draped in a sterile fashion. Skin was anesthetized with 1% lidocaine. 21 gauge needle directed into the right internal jugular vein with ultrasound guidance. Micropuncture dilator set was placed. Five French catheter was advanced into the IVC and a Bentson wire was placed. The tract was dilated to accommodate a 10 [place] sheath. 5 French catheter was advanced into a right hepatic vein. Hepatic venography was performed. Wedged and free hepatic vein pressures were obtained. The transjugular biopsy kit was then advanced through the sheath over a stiff Amplatz wire. Total of 5  core biopsies were performed. Four adequate core biopsies were obtained. Three specimens placed in formalin for pathology. One specimen was placed in saline for microbiology. Follow-up venography was performed through the 5 French catheter at of the procedure. Sheath were removed with manual compression. Fluoroscopic and ultrasound images were taken and saved for documentation. FINDINGS: Two sets of hepatic venous pressures were obtained. The mean gradient on the first set of pressures was 7. The gradient on the second set of pressures was 6-7. Findings are suggestive for an elevated hepatic venous pressure gradient and concerning for portal hypertension. Core biopsies obtained from the right hepatic lobe. Four adequate specimens were obtained. No significant bleeding or extravasation on the final hepatic venography. IMPRESSION: Successful transjugular liver biopsy. Specimens sent to pathology and microbiology. Elevated hepatic venous pressure gradient. Electronically Signed   By: Markus Daft M.D.   On: 01/07/2018 18:17    Scheduled Meds: . bictegravir-emtricitabine-tenofovir AF  1 tablet Oral Daily  . dexamethasone  40 mg Oral Daily  . ethambutol  15 mg/kg Oral Daily  . feeding supplement  1 Container Oral TID BM  . folic acid  2 mg Oral Daily  . insulin aspart  0-9 Units Subcutaneous TID WC  . iopamidol  30 mL Oral Once  . multivitamin with minerals  1 tablet Oral Daily  . nystatin  5 mL Oral QID  . pantoprazole (PROTONIX) IV  40 mg Intravenous Q12H  . phytonadione  10 mg Subcutaneous Daily  . sucralfate  1 g Oral TID WC & HS    Continuous Infusions: . sodium chloride 150 mL/hr at 01/07/18 0004  . sodium chloride    . anidulafungin Stopped (01/06/18 1825)  . azithromycin 500 mg (01/07/18 1900)     LOS: 8 days     Desiree Hane, MD [facility] Pager [phone]  If 7PM-7AM, please contact night-coverage [email or web address] Password [facility] 01/07/2018, 8:44 PM

## 2018-01-07 NOTE — Progress Notes (Signed)
NP notified of hgb of 6.7. No new orders given.  Hale Bogus.

## 2018-01-07 NOTE — Sedation Documentation (Signed)
Hepatic pressures:  Wedge (7), Free (1)

## 2018-01-08 DIAGNOSIS — K8502 Idiopathic acute pancreatitis with infected necrosis: Secondary | ICD-10-CM

## 2018-01-08 LAB — TYPE AND SCREEN
ABO/RH(D): A POS
ANTIBODY SCREEN: NEGATIVE
UNIT DIVISION: 0
Unit division: 0
Unit division: 0

## 2018-01-08 LAB — BPAM RBC
BLOOD PRODUCT EXPIRATION DATE: 201901282359
Blood Product Expiration Date: 201901292359
Blood Product Expiration Date: 201902022359
ISSUE DATE / TIME: 201901201417
ISSUE DATE / TIME: 201901231058
ISSUE DATE / TIME: 201901240416
UNIT TYPE AND RH: 6200
UNIT TYPE AND RH: 6200
Unit Type and Rh: 6200

## 2018-01-08 LAB — BASIC METABOLIC PANEL
ANION GAP: 5 (ref 5–15)
ANION GAP: 7 (ref 5–15)
BUN: 8 mg/dL (ref 6–20)
BUN: 9 mg/dL (ref 6–20)
CALCIUM: 7.4 mg/dL — AB (ref 8.9–10.3)
CHLORIDE: 110 mmol/L (ref 101–111)
CHLORIDE: 113 mmol/L — AB (ref 101–111)
CO2: 17 mmol/L — AB (ref 22–32)
CO2: 19 mmol/L — AB (ref 22–32)
Calcium: 7.7 mg/dL — ABNORMAL LOW (ref 8.9–10.3)
Creatinine, Ser: 0.41 mg/dL — ABNORMAL LOW (ref 0.44–1.00)
GFR calc non Af Amer: >60 mL/min (ref >60)
Glucose, Bld: 158 mg/dL — ABNORMAL HIGH (ref 65–99)
Glucose, Bld: 225 mg/dL — ABNORMAL HIGH (ref 65–99)
POTASSIUM: 3.2 mmol/L — AB (ref 3.5–5.1)
POTASSIUM: 3.4 mmol/L — AB (ref 3.5–5.1)
SODIUM: 136 mmol/L (ref 135–145)
Sodium: 135 mmol/L (ref 135–145)

## 2018-01-08 LAB — CBC WITH DIFFERENTIAL/PLATELET
BASOS PCT: 1 %
Basophils Absolute: 0 10*3/uL (ref 0.0–0.1)
EOS ABS: 0 10*3/uL (ref 0.0–0.7)
Eosinophils Relative: 0 %
HCT: 21 % — ABNORMAL LOW (ref 36.0–46.0)
Hemoglobin: 7.4 g/dL — ABNORMAL LOW (ref 12.0–15.0)
Lymphocytes Relative: 4 %
Lymphs Abs: 0.1 10*3/uL (ref 0.7–4.0)
MCH: 26.8 pg (ref 26.0–34.0)
MCHC: 35.2 g/dL (ref 30.0–36.0)
MCV: 76.1 fL — AB (ref 78.0–100.0)
MONOS PCT: 4 %
Monocytes Absolute: 0.1 10*3/uL (ref 0.1–1.0)
Neutro Abs: 2.9 10*3/uL (ref 1.7–7.7)
Neutrophils Relative %: 91 %
PLATELETS: 37 10*3/uL — AB (ref 150–400)
RBC: 2.76 MIL/uL — AB (ref 3.87–5.11)
RDW: 22.9 % — AB (ref 11.5–15.5)
WBC: 3.1 10*3/uL — AB (ref 4.0–10.5)

## 2018-01-08 LAB — HEPATIC FUNCTION PANEL
ALT: 40 U/L (ref 14–54)
AST: 110 U/L — ABNORMAL HIGH (ref 15–41)
Albumin: 1.8 g/dL — ABNORMAL LOW (ref 3.5–5.0)
Alkaline Phosphatase: 414 U/L — ABNORMAL HIGH (ref 38–126)
BILIRUBIN DIRECT: 6.2 mg/dL — AB (ref 0.1–0.5)
BILIRUBIN TOTAL: 10.3 mg/dL — AB (ref 0.3–1.2)
Indirect Bilirubin: 4.1 mg/dL — ABNORMAL HIGH (ref 0.3–0.9)
Total Protein: 4.8 g/dL — ABNORMAL LOW (ref 6.5–8.1)

## 2018-01-08 LAB — GLUCOSE, CAPILLARY
GLUCOSE-CAPILLARY: 163 mg/dL — AB (ref 65–99)
GLUCOSE-CAPILLARY: 150 mg/dL — AB (ref 65–99)
Glucose-Capillary: 154 mg/dL — ABNORMAL HIGH (ref 65–99)
Glucose-Capillary: 147 mg/dL — ABNORMAL HIGH (ref 65–99)

## 2018-01-08 LAB — PREPARE PLATELET PHERESIS: Unit division: 0

## 2018-01-08 LAB — PROTIME-INR
INR: 1.33
Prothrombin Time: 16.4 seconds — ABNORMAL HIGH (ref 11.4–15.2)

## 2018-01-08 LAB — MAGNESIUM: Magnesium: 2.4 mg/dL (ref 1.7–2.4)

## 2018-01-08 LAB — BPAM PLATELET PHERESIS
BLOOD PRODUCT EXPIRATION DATE: 201901252359
ISSUE DATE / TIME: 201901231437
UNIT TYPE AND RH: 6200

## 2018-01-08 LAB — ERYTHROPOIETIN: Erythropoietin: 213.1 m[IU]/mL — ABNORMAL HIGH (ref 2.6–18.5)

## 2018-01-08 LAB — PREPARE RBC (CROSSMATCH)

## 2018-01-08 MED ORDER — MAGNESIUM SULFATE 2 GM/50ML IV SOLN
2.0000 g | Freq: Once | INTRAVENOUS | Status: AC
  Administered 2018-01-08: 2 g via INTRAVENOUS
  Filled 2018-01-08: qty 50

## 2018-01-08 MED ORDER — POTASSIUM CHLORIDE 20 MEQ PO PACK: 40.0000 meq | PACK | Freq: Once | ORAL | Status: DC

## 2018-01-08 MED ORDER — POTASSIUM CHLORIDE CRYS ER 20 MEQ PO TBCR
40.0000 meq | EXTENDED_RELEASE_TABLET | Freq: Once | ORAL | Status: DC
  Filled 2018-01-08: qty 2

## 2018-01-08 NOTE — Progress Notes (Signed)
PT Cancellation Note  Patient Details Name: [name] MRN: [id number] DOB: Jan 08, 1976   Cancelled Treatment:    Reason Eval/Treat Not Completed: Patient declined, no reason specified. Will check back if schedule allows-possibly tomorrow. thanks.    Weston Anna, MPT Pager: [phone]

## 2018-01-08 NOTE — Progress Notes (Addendum)
[facility]    Date of Admission:  12/29/2017      ID: [name] is a 42 y.o. female with   Principal Problem:   Pancreatitis Active Problems:   HIV disease ([place])   Bipolar disorder ([place])   Hypokalemia   Esophageal stricture   Dysphagia   Candidal esophagitis ([facility])   Severe protein-calorie malnutrition ([facility])   Abdominal pain   Lactic acidosis    Subjective:  Having episodes of bradycardia, she mentioned that she is upset that she is back to a purree diet and said that she only chocked on one piece of food that would have went down.   Medications:  . bictegravir-emtricitabine-tenofovir AF  1 tablet Oral Daily  . dexamethasone  40 mg Oral Daily  . feeding supplement  1 Container Oral TID BM  . folic acid  2 mg Oral Daily  . insulin aspart  0-9 Units Subcutaneous TID WC  . iopamidol  30 mL Oral Once  . multivitamin with minerals  1 tablet Oral Daily  . nystatin  5 mL Oral QID  . pantoprazole (PROTONIX) IV  40 mg Intravenous Q12H  . phytonadione  10 mg Subcutaneous Daily  . sucralfate  1 g Oral TID WC & HS    Objective: Vital signs in last 24 hours: Temp:  [97.2 F (36.2 C)-97.5 F (36.4 C)] 97.3 F (36.3 C) (01/24 1200) Pulse Rate:  [31-76] 42 (01/24 1000) Resp:  [12-23] 18 (01/24 1000) BP: (110-182)/(53-99) 166/89 (01/24 0900) SpO2:  [88 %-98 %] 96 % (01/24 0800)  Physical Exam  Constitutional:  oriented to person, place, and time. Appears cachetic, ill appearing . No distress.  HENT: [place]/AT, PERRLA, no scleral icterus Mouth/Throat: Oropharynx is clear and moist. No oropharyngeal exudate.  Cardiovascular: Normal rate, regular rhythm and normal heart sounds. Exam reveals no gallop and no friction rub.  No murmur heard.  Pulmonary/Chest: Effort normal and breath sounds normal. No respiratory distress.  has no wheezes.  Neck = supple, no nuchal rigidity Abdominal: Soft. Bowel sounds are normal.  exhibits no distension. There is  no tenderness.  Lymphadenopathy: no cervical adenopathy. No axillary adenopathy Neurological: alert and oriented to person, place, and time.  Skin: Skin is warm and dry. No rash noted. No erythema.  Psychiatric: a normal mood and affect.  behavior is normal.    Lab Results Recent Labs    01/07/18 2119 01/07/18 2338 01/08/18 0338  WBC 3.7*  --  3.1*  HGB 8.6*  --  7.4*  HCT 24.7*  --  21.0*  NA  --  135 136  K  --  3.2* 3.4*  CL  --  113* 110  CO2  --  17* 19*  BUN  --  8 9  CREATININE  --  0.41* <0.30*   Liver Panel Recent Labs    01/07/18 0431 01/08/18 0338  PROT 4.2* 4.8*  ALBUMIN 1.3* 1.8*  AST 181* 110*  ALT 48 40  ALKPHOS 425* 414*  BILITOT 12.8* 10.3*  BILIDIR 7.4* 6.2*  IBILI 5.4* 4.1*    Microbiology: I have reviewed her micro data Studies/Results: Ct Abdomen Pelvis W Contrast  Result Date: 01/06/2018 CLINICAL DATA:  Abdominal distension with elevated lactate EXAM: CT ABDOMEN AND PELVIS WITH CONTRAST TECHNIQUE: Multidetector CT imaging of the abdomen and pelvis was performed using the standard protocol following bolus administration of intravenous contrast. CONTRAST:  175m ISOVUE-300 IOPAMIDOL (ISOVUE-300) INJECTION 61% COMPARISON:  MRI 12/31/2017, CT  abdomen pelvis 12/30/2017, 10/23/2017, 10/21/2017, 10/13/2017, 10/16/2016 FINDINGS: Lower chest: Lung bases demonstrate moderate pleural effusions, increased compared to prior. Partial consolidation in the lower lobes, atelectasis versus pneumonia. Mild cardiomegaly. Mild distal esophageal and GE junction thickening. Hepatobiliary: No focal hepatic abnormality. Surgical clips at the gallbladder fossa. No biliary dilatation Pancreas: Decreased pancreatic edema.  No ductal dilatation Spleen: Interval enlargement of the spleen now measuring 14 cm on coronal views. Adrenals/Urinary Tract: Adrenal glands are unremarkable. Kidneys are normal, without renal calculi, focal lesion, or hydronephrosis. Bladder is unremarkable.  Stomach/Bowel: Stomach is unremarkable. Fluid-filled loops of small bowel within the abdomen. No significant wall thickening. No intramural air. Normal appendix. No colon wall thickening. Vascular/Lymphatic: Nonaneurysmal aorta. Patent portal vein. Non enhancement of the splenic vein consistent with occlusion, new since the comparison CT. Enlarged retroperitoneal lymph nodes as before. Reproductive: Uterus and bilateral adnexa are unremarkable. Other: Large volume of ascites, increased compared to prior. No free air. Musculoskeletal: Degenerative changes. No acute or suspicious abnormality. IMPRESSION: 1. Moderate bilateral pleural effusions, increased compared to prior. Partial consolidations in the bilateral lower lobes may reflect atelectasis or pneumonia 2. 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 Portal vein remains patent. Interval enlargement of the spleen. 3. Decreased pancreatic edema. Large volume of ascites in the abdomen and pelvis, increased compared to prior. 4. Fluid-filled loops of central small bowel, possible ileus. No intramural air or portal venous gas. 5. Stable retroperitoneal lymphadenopathy Electronically Signed   By: Donavan Foil M.D.   On: 01/06/2018 23:28   Ir Venogram Hepatic W Hemodynamic Evaluation  Result Date: 01/07/2018 INDICATION: 42 year old female with a complex medical history including markedly abnormal liver enzymes. Request for liver biopsy. Patient has thrombocytopenia and received platelets prior to the procedure. EXAM: TRANSJUGULAR LIVER BIOPSY WITH FLUOROSCOPY HEPATIC VENOGRAPHY WITH PRESSURES ULTRASOUND GUIDANCE FOR VASCULAR ACCESS MEDICATIONS: None. ANESTHESIA/SEDATION: Moderate (conscious) sedation was employed during this procedure. A total of Versed 1 mg and Fentanyl 25 mcg was administered intravenously. Moderate Sedation Time: 41 minutes. The patient's level of consciousness and vital signs were monitored  continuously by radiology nursing throughout the procedure under my direct supervision. FLUOROSCOPY TIME:  Fluoroscopy Time: 7 minutes and 54 seconds. 295 mGy COMPLICATIONS: None immediate. PROCEDURE: Informed written consent was obtained from the patient after a thorough discussion of the procedural risks, benefits and alternatives. All questions were addressed. Maximal Sterile Barrier Technique was utilized including caps, mask, sterile gowns, sterile gloves, sterile drape, hand hygiene and skin antiseptic. A timeout was performed prior to the initiation of the procedure. Ultrasound confirmed a patent right internal jugular vein. The right side of the neck was prepped and draped in a sterile fashion. Skin was anesthetized with 1% lidocaine. 21 gauge needle directed into the right internal jugular vein with ultrasound guidance. Micropuncture dilator set was placed. Five French catheter was advanced into the IVC and a Bentson wire was placed. The tract was dilated to accommodate a 10 [place] sheath. 5 French catheter was advanced into a right hepatic vein. Hepatic venography was performed. Wedged and free hepatic vein pressures were obtained. The transjugular biopsy kit was then advanced through the sheath over a stiff Amplatz wire. Total of 5 core biopsies were performed. Four adequate core biopsies were obtained. Three specimens placed in formalin for pathology. One specimen was placed in saline for microbiology. Follow-up venography was performed through the 5 French catheter at of the procedure. Sheath were removed with manual compression. Fluoroscopic and ultrasound  images were taken and saved for documentation. FINDINGS: Two sets of hepatic venous pressures were obtained. The mean gradient on the first set of pressures was 7. The gradient on the second set of pressures was 6-7. Findings are suggestive for an elevated hepatic venous pressure gradient and concerning for portal hypertension. Core biopsies obtained  from the right hepatic lobe. Four adequate specimens were obtained. No significant bleeding or extravasation on the final hepatic venography. IMPRESSION: Successful transjugular liver biopsy. Specimens sent to pathology and microbiology. Elevated hepatic venous pressure gradient. Electronically Signed   By: Markus Daft M.D.   On: 01/07/2018 18:17   Ir Transcatheter Bx  Result Date: 01/07/2018 INDICATION: 42 year old female with a complex medical history including markedly abnormal liver enzymes. Request for liver biopsy. Patient has thrombocytopenia and received platelets prior to the procedure. EXAM: TRANSJUGULAR LIVER BIOPSY WITH FLUOROSCOPY HEPATIC VENOGRAPHY WITH PRESSURES ULTRASOUND GUIDANCE FOR VASCULAR ACCESS MEDICATIONS: None. ANESTHESIA/SEDATION: Moderate (conscious) sedation was employed during this procedure. A total of Versed 1 mg and Fentanyl 25 mcg was administered intravenously. Moderate Sedation Time: 41 minutes. The patient's level of consciousness and vital signs were monitored continuously by radiology nursing throughout the procedure under my direct supervision. FLUOROSCOPY TIME:  Fluoroscopy Time: 7 minutes and 54 seconds. 660 mGy COMPLICATIONS: None immediate. PROCEDURE: Informed written consent was obtained from the patient after a thorough discussion of the procedural risks, benefits and alternatives. All questions were addressed. Maximal Sterile Barrier Technique was utilized including caps, mask, sterile gowns, sterile gloves, sterile drape, hand hygiene and skin antiseptic. A timeout was performed prior to the initiation of the procedure. Ultrasound confirmed a patent right internal jugular vein. The right side of the neck was prepped and draped in a sterile fashion. Skin was anesthetized with 1% lidocaine. 21 gauge needle directed into the right internal jugular vein with ultrasound guidance. Micropuncture dilator set was placed. Five French catheter was advanced into the IVC and a  Bentson wire was placed. The tract was dilated to accommodate a 10 [place] sheath. 5 French catheter was advanced into a right hepatic vein. Hepatic venography was performed. Wedged and free hepatic vein pressures were obtained. The transjugular biopsy kit was then advanced through the sheath over a stiff Amplatz wire. Total of 5 core biopsies were performed. Four adequate core biopsies were obtained. Three specimens placed in formalin for pathology. One specimen was placed in saline for microbiology. Follow-up venography was performed through the 5 French catheter at of the procedure. Sheath were removed with manual compression. Fluoroscopic and ultrasound images were taken and saved for documentation. FINDINGS: Two sets of hepatic venous pressures were obtained. The mean gradient on the first set of pressures was 7. The gradient on the second set of pressures was 6-7. Findings are suggestive for an elevated hepatic venous pressure gradient and concerning for portal hypertension. Core biopsies obtained from the right hepatic lobe. Four adequate specimens were obtained. No significant bleeding or extravasation on the final hepatic venography. IMPRESSION: Successful transjugular liver biopsy. Specimens sent to pathology and microbiology. Elevated hepatic venous pressure gradient. Electronically Signed   By: Markus Daft M.D.   On: 01/07/2018 18:17     Assessment/Plan: Bradycardia = hr in 40s. she was empirically started on azithromycin and ethambutol on evening of 1/22 then noticeable bradycardia on her vitals occurred over the next 2 days. Azithromycin can cause long QT as well as bradycardia in those with hypomagnesiumia. azithro and EMB started empirically for MAC given progressive worsening of her liver  process presumed to be OI. It is improving but she is also on steroids. We will stop azithromycin and EMB unless we can document she has MAC infection (either by liver biopsy or blood cx)  - please give 1 run  or 2 of magnesium with potassium for goal of K of 4, Mg of 2. Please check Magnesium level - keep on telemetry  Thrombocytopenia = continue on dexamethasone. She received nplate yesterday. Her plt are at 30s The current medical regimen is effective;  continue present plan and medications. Defer to dr Marin Olp if she needs further dose of nplate  hiv disease =continue on biktarvy. VL at 25,000 improved for 1MU ( in Nov)  eso candidiasis = continue on anidulafungin  Liver dysfunction with hyperbili/transaminitis/increased INR = all values looking improved today. Continue with check hepatic panel tomorrow, if continues to improve can likely do QOD  eso stricture = still having symptoms and unable to swallow solids. Awaiting for plts to improve before she can safely undergo dilatation.  [facility] Cell: [phone] Pager: [phone]  01/08/2018, 2:21 PM

## 2018-01-08 NOTE — Progress Notes (Signed)
Subjective: The patient was seen and examined at bedside. She complains of chest pain, and more difficulty swallowing solids than in the last few days. She did not have a bowel movement today, abdominal pain is mild to moderate in intensity. She underwent a transjugular liver biopsy yesterday.  Objective: Vital signs in last 24 hours: Temp:  [97.2 F (36.2 C)-97.6 F (36.4 C)] 97.5 F (36.4 C) (01/24 0800) Pulse Rate:  [31-76] 41 (01/24 0700) Resp:  [12-23] 18 (01/24 0700) BP: (110-182)/(53-99) 159/93 (01/24 0700) SpO2:  [88 %-98 %] 88 % (01/24 0700) Weight change:  Last BM Date: 01/07/18  PE: Chronically ill appearing, thinly built GENERAL: Obvious pallor, obvious icterus ABDOMEN: Soft but mild generalized tenderness, normoactive bowel sounds EXTREMITIES: No deformity  Lab Results: Results for orders placed or performed during the hospital encounter of 12/29/17 (from the past 48 hour(s))  Glucose, capillary     Status: Abnormal   Collection Time: 01/06/18 11:43 AM  Result Value Ref Range   Glucose-Capillary 349 (H) 65 - 99 mg/dL  Basic metabolic panel     Status: Abnormal   Collection Time: 01/06/18 12:10 PM  Result Value Ref Range   Sodium 130 (L) 135 - 145 mmol/L   Potassium 2.9 (L) 3.5 - 5.1 mmol/L   Chloride 103 101 - 111 mmol/L   CO2 16 (L) 22 - 32 mmol/L   Glucose, Bld 376 (H) 65 - 99 mg/dL   BUN 9 6 - 20 mg/dL   Creatinine, Ser <0.30 (L) 0.44 - 1.00 mg/dL   Calcium 8.0 (L) 8.9 - 10.3 mg/dL   GFR calc non Af Amer NOT CALCULATED >60 mL/min   GFR calc Af Amer NOT CALCULATED >60 mL/min    Comment: (NOTE) The eGFR has been calculated using the CKD EPI equation. This calculation has not been validated in all clinical situations. eGFR's persistently <60 mL/min signify possible Chronic Kidney Disease.    Anion gap 11 5 - 15  Hepatitis B core antibody, IgM     Status: None   Collection Time: 01/06/18 12:10 PM  Result Value Ref Range   Hep B C IgM Negative Negative     Comment: (NOTE) Performed At: [facility] [address] [place], [place] [id number] Rush Farmer MD WV:[id number]   Alpha-1-antitrypsin     Status: None   Collection Time: 01/06/18 12:10 PM  Result Value Ref Range   A-1 Antitrypsin, Ser 191 90 - 200 mg/dL    Comment: (NOTE) Performed At: [facility] [place], [place] [id number] Rush Farmer MD [id number]   Ceruloplasmin     Status: None   Collection Time: 01/06/18 12:10 PM  Result Value Ref Range   Ceruloplasmin 37.1 19.0 - 39.0 mg/dL    Comment: (NOTE) Performed At: [facility] [place], [place] [id number] Rush Farmer MD OF:[id number]   Mitochondrial antibodies     Status: None   Collection Time: 01/06/18 12:10 PM  Result Value Ref Range   Mitochondrial M2 Ab, IgG <20.0 0.0 - 20.0 Units    Comment: (NOTE)                                Negative    0.0 - 20.0                                Equivocal  20.1 -  24.9                                Positive         >24.9 Mitochondrial (M2) Antibodies are found in 90-96% of patients with primary biliary cirrhosis. Performed At: [facility] [place], [place] [id number] Rush Farmer MD JG:[id number]   Anti-smooth muscle antibody, IgG     Status: None   Collection Time: 01/06/18 12:10 PM  Result Value Ref Range   F-Actin IgG 8 0 - 19 Units    Comment: (NOTE)                 Negative                     0 - 19                 Weak positive               20 - 30                 Moderate to strong positive     >30 Actin Antibodies are found in 52-85% of patients with autoimmune hepatitis or chronic active hepatitis and in 22% of patients with primary biliary cirrhosis. Performed At: [facility] [place], [place] [id number] Rush Farmer MD [id number]   Hepatitis C antibody     Status: None   Collection Time: 01/06/18 12:10 PM  Result Value Ref Range   HCV  Ab <0.1 0.0 - 0.9 s/co ratio    Comment: (NOTE)                                  Negative:     < 0.8                             Indeterminate: 0.8 - 0.9                                  Positive:     > 0.9 The CDC recommends that a positive HCV antibody result be followed up with a HCV Nucleic Acid Amplification test (248250). Performed At: [facility] [place], [place] [id number] Rush Farmer MD [id number]   Basic metabolic panel     Status: Abnormal   Collection Time: 01/06/18  3:23 PM  Result Value Ref Range   Sodium 130 (L) 135 - 145 mmol/L   Potassium 2.8 (L) 3.5 - 5.1 mmol/L   Chloride 102 101 - 111 mmol/L   CO2 14 (L) 22 - 32 mmol/L   Glucose, Bld 337 (H) 65 - 99 mg/dL   BUN 9 6 - 20 mg/dL   Creatinine, Ser 0.39 (L) 0.44 - 1.00 mg/dL   Calcium 8.1 (L) 8.9 - 10.3 mg/dL   GFR calc non Af Amer >60 >60 mL/min   GFR calc Af Amer >60 >60 mL/min    Comment: (NOTE) The eGFR has been calculated using the CKD EPI equation. This calculation has not been validated in all clinical situations. eGFR's persistently <60 mL/min signify possible Chronic Kidney Disease.    Anion gap 14 5 - 15  Lactic acid, plasma     Status:  Abnormal   Collection Time: 01/06/18  3:23 PM  Result Value Ref Range   Lactic Acid, Venous 7.4 (HH) 0.5 - 1.9 mmol/L    Comment: CRITICAL RESULT CALLED TO, READ BACK BY AND VERIFIED WITH: J.DEUTCSH AT 1632 ON 01/06/18 BY N.THOMPSON   Blood gas, arterial     Status: Abnormal   Collection Time: 01/06/18  3:40 PM  Result Value Ref Range   FIO2 21.00    pH, Arterial 7.365 7.350 - 7.450   pCO2 arterial 25.6 (L) 32.0 - 48.0 mmHg   pO2, Arterial 69.9 (L) 83.0 - 108.0 mmHg   Bicarbonate 14.3 (L) 20.0 - 28.0 mmol/L   Acid-base deficit 9.7 (H) 0.0 - 2.0 mmol/L   O2 Saturation 91.4 %   Patient temperature 37.0    Allens test (pass/fail) PASS PASS  Glucose, capillary     Status: Abnormal   Collection Time: 01/06/18  3:53 PM  Result Value  Ref Range   Glucose-Capillary 286 (H) 65 - 99 mg/dL  Urinalysis, Routine w reflex microscopic     Status: Abnormal   Collection Time: 01/06/18  5:02 PM  Result Value Ref Range   Color, Urine AMBER (A) YELLOW    Comment: BIOCHEMICALS MAY BE AFFECTED BY COLOR   APPearance HAZY (A) CLEAR   Specific Gravity, Urine 1.017 1.005 - 1.030   pH 6.0 5.0 - 8.0   Glucose, UA >=500 (A) NEGATIVE mg/dL   Hgb urine dipstick SMALL (A) NEGATIVE   Bilirubin Urine MODERATE (A) NEGATIVE   Ketones, ur NEGATIVE NEGATIVE mg/dL   Protein, ur NEGATIVE NEGATIVE mg/dL   Nitrite NEGATIVE NEGATIVE   Leukocytes, UA NEGATIVE NEGATIVE   RBC / HPF 0-5 0 - 5 RBC/hpf   WBC, UA 6-30 0 - 5 WBC/hpf   Bacteria, UA RARE (A) NONE SEEN   Squamous Epithelial / LPF 6-30 (A) NONE SEEN   Mucus PRESENT    Hyaline Casts, UA PRESENT   Glucose, capillary     Status: Abnormal   Collection Time: 01/06/18  5:56 PM  Result Value Ref Range   Glucose-Capillary 204 (H) 65 - 99 mg/dL  Basic metabolic panel     Status: Abnormal   Collection Time: 01/06/18  6:23 PM  Result Value Ref Range   Sodium 132 (L) 135 - 145 mmol/L   Potassium 3.2 (L) 3.5 - 5.1 mmol/L   Chloride 108 101 - 111 mmol/L   CO2 15 (L) 22 - 32 mmol/L   Glucose, Bld 194 (H) 65 - 99 mg/dL   BUN 8 6 - 20 mg/dL   Creatinine, Ser <0.30 (L) 0.44 - 1.00 mg/dL   Calcium 7.8 (L) 8.9 - 10.3 mg/dL   GFR calc non Af Amer NOT CALCULATED >60 mL/min   GFR calc Af Amer NOT CALCULATED >60 mL/min    Comment: (NOTE) The eGFR has been calculated using the CKD EPI equation. This calculation has not been validated in all clinical situations. eGFR's persistently <60 mL/min signify possible Chronic Kidney Disease.    Anion gap 9 5 - 15  Beta-hydroxybutyric acid     Status: None   Collection Time: 01/06/18  6:23 PM  Result Value Ref Range   Beta-Hydroxybutyric Acid 0.06 0.05 - 0.27 mmol/L  MRSA PCR Screening     Status: None   Collection Time: 01/06/18  6:59 PM  Result Value Ref  Range   MRSA by PCR NEGATIVE NEGATIVE    Comment:        The GeneXpert MRSA  Assay (FDA approved for NASAL specimens only), is one component of a comprehensive MRSA colonization surveillance program. It is not intended to diagnose MRSA infection nor to guide or monitor treatment for MRSA infections.   Glucose, capillary     Status: Abnormal   Collection Time: 01/06/18  7:57 PM  Result Value Ref Range   Glucose-Capillary 131 (H) 65 - 99 mg/dL  Basic metabolic panel     Status: Abnormal   Collection Time: 01/06/18  9:17 PM  Result Value Ref Range   Sodium 132 (L) 135 - 145 mmol/L   Potassium 3.4 (L) 3.5 - 5.1 mmol/L   Chloride 109 101 - 111 mmol/L   CO2 14 (L) 22 - 32 mmol/L   Glucose, Bld 246 (H) 65 - 99 mg/dL   BUN 7 6 - 20 mg/dL   Creatinine, Ser <0.30 (L) 0.44 - 1.00 mg/dL   Calcium 7.8 (L) 8.9 - 10.3 mg/dL   GFR calc non Af Amer NOT CALCULATED >60 mL/min   GFR calc Af Amer NOT CALCULATED >60 mL/min    Comment: (NOTE) The eGFR has been calculated using the CKD EPI equation. This calculation has not been validated in all clinical situations. eGFR's persistently <60 mL/min signify possible Chronic Kidney Disease.    Anion gap 9 5 - 15  Glucose, capillary     Status: Abnormal   Collection Time: 01/06/18  9:26 PM  Result Value Ref Range   Glucose-Capillary 238 (H) 65 - 99 mg/dL  Glucose, capillary     Status: Abnormal   Collection Time: 01/06/18 11:17 PM  Result Value Ref Range   Glucose-Capillary 150 (H) 65 - 99 mg/dL  Basic metabolic panel     Status: Abnormal   Collection Time: 01/07/18 12:24 AM  Result Value Ref Range   Sodium 135 135 - 145 mmol/L   Potassium 3.7 3.5 - 5.1 mmol/L   Chloride 112 (H) 101 - 111 mmol/L   CO2 17 (L) 22 - 32 mmol/L   Glucose, Bld 137 (H) 65 - 99 mg/dL   BUN 7 6 - 20 mg/dL   Creatinine, Ser <0.30 (L) 0.44 - 1.00 mg/dL   Calcium 7.6 (L) 8.9 - 10.3 mg/dL   GFR calc non Af Amer NOT CALCULATED >60 mL/min   GFR calc Af Amer NOT  CALCULATED >60 mL/min    Comment: (NOTE) The eGFR has been calculated using the CKD EPI equation. This calculation has not been validated in all clinical situations. eGFR's persistently <60 mL/min signify possible Chronic Kidney Disease.    Anion gap 6 5 - 15  Glucose, capillary     Status: Abnormal   Collection Time: 01/07/18  3:25 AM  Result Value Ref Range   Glucose-Capillary 125 (H) 65 - 99 mg/dL  Hepatic function panel     Status: Abnormal   Collection Time: 01/07/18  4:31 AM  Result Value Ref Range   Total Protein 4.2 (L) 6.5 - 8.1 g/dL   Albumin 1.3 (L) 3.5 - 5.0 g/dL   AST 181 (H) 15 - 41 U/L   ALT 48 14 - 54 U/L   Alkaline Phosphatase 425 (H) 38 - 126 U/L   Total Bilirubin 12.8 (H) 0.3 - 1.2 mg/dL    Comment: DELTA CHECK NOTED REPEATED TO VERIFY    Bilirubin, Direct 7.4 (H) 0.1 - 0.5 mg/dL   Indirect Bilirubin 5.4 (H) 0.3 - 0.9 mg/dL  CBC with Differential/Platelet     Status: Abnormal   Collection Time: 01/07/18  4:31 AM  Result Value Ref Range   WBC 3.4 (L) 4.0 - 10.5 K/uL    Comment: WHITE COUNT CONFIRMED ON SMEAR   RBC 2.59 (L) 3.87 - 5.11 MIL/uL   Hemoglobin 6.7 (LL) 12.0 - 15.0 g/dL    Comment: REPEATED TO VERIFY CRITICAL RESULT CALLED TO, READ BACK BY AND VERIFIED WITH: Mellody Drown RN 01/07/18 0504 A NAVARRO    HCT 19.2 (L) 36.0 - 46.0 %   MCV 74.1 (L) 78.0 - 100.0 fL   MCH 25.9 (L) 26.0 - 34.0 pg   MCHC 34.9 30.0 - 36.0 g/dL   RDW 22.7 (H) 11.5 - 15.5 %   Platelets 23 (LL) 150 - 400 K/uL    Comment: REPEATED TO VERIFY SPECIMEN CHECKED FOR CLOTS PLATELET COUNT CONFIRMED BY SMEAR CRITICAL VALUE NOTED.  VALUE IS CONSISTENT WITH PREVIOUSLY REPORTED AND CALLED VALUE.    Neutrophils Relative % 90 %   Lymphocytes Relative 4 %   Monocytes Relative 6 %   Eosinophils Relative 0 %   Basophils Relative 0 %   Neutro Abs 3.1 1.7 - 7.7 K/uL   Lymphs Abs 0.1 (L) 0.7 - 4.0 K/uL   Monocytes Absolute 0.2 0.1 - 1.0 K/uL   Eosinophils Absolute 0.0 0.0 - 0.7 K/uL    Basophils Absolute 0.0 0.0 - 0.1 K/uL   RBC Morphology POLYCHROMASIA PRESENT     Comment: TARGET CELLS   WBC Morphology TOXIC GRANULATION   Protime-INR     Status: Abnormal   Collection Time: 01/07/18  4:31 AM  Result Value Ref Range   Prothrombin Time 21.0 (H) 11.4 - 15.2 seconds   INR 1.83   Lactic acid, plasma     Status: Abnormal   Collection Time: 01/07/18  4:31 AM  Result Value Ref Range   Lactic Acid, Venous 2.4 (HH) 0.5 - 1.9 mmol/L    Comment: CRITICAL RESULT CALLED TO, READ BACK BY AND VERIFIED WITH: WILLIAMS,V RN 1.23.19 _0  ZANDO,C   Cortisol-am, blood     Status: Abnormal   Collection Time: 01/07/18  4:31 AM  Result Value Ref Range   Cortisol - AM 3.6 (L) 6.7 - 22.6 ug/dL    Comment: Performed at [facility], [place] [address], [place], [place] [address]  Basic metabolic panel     Status: Abnormal   Collection Time: 01/07/18  4:31 AM  Result Value Ref Range   Sodium 136 135 - 145 mmol/L   Potassium 3.5 3.5 - 5.1 mmol/L   Chloride 114 (H) 101 - 111 mmol/L   CO2 19 (L) 22 - 32 mmol/L   Glucose, Bld 134 (H) 65 - 99 mg/dL   BUN 7 6 - 20 mg/dL   Creatinine, Ser <0.30 (L) 0.44 - 1.00 mg/dL   Calcium 7.4 (L) 8.9 - 10.3 mg/dL   GFR calc non Af Amer NOT CALCULATED >60 mL/min   GFR calc Af Amer NOT CALCULATED >60 mL/min    Comment: (NOTE) The eGFR has been calculated using the CKD EPI equation. This calculation has not been validated in all clinical situations. eGFR's persistently <60 mL/min signify possible Chronic Kidney Disease.    Anion gap 3 (L) 5 - 15  Prepare RBC     Status: None   Collection Time: 01/07/18  6:30 AM  Result Value Ref Range   Order Confirmation ORDER PROCESSED BY BLOOD BANK   Prepare RBC     Status: None   Collection Time: 01/07/18  7:09 AM  Result Value  Ref Range   Order Confirmation ORDER PROCESSED BY BLOOD BANK   Prepare fresh frozen plasma     Status: None (Preliminary result)   Collection Time: 01/07/18  7:09 AM  Result Value Ref  Range   Unit Number [id number]    Blood Component Type THAWED PLASMA    Unit division 00    Status of Unit ISSUED    Transfusion Status OK TO TRANSFUSE    Unit Number [id number]    Blood Component Type THAWED PLASMA    Unit division 00    Status of Unit ISSUED    Transfusion Status OK TO TRANSFUSE   Prepare Pheresed Platelets     Status: None   Collection Time: 01/07/18  7:09 AM  Result Value Ref Range   Unit Number [id number]    Blood Component Type PLTPHER LR2    Unit division 00    Status of Unit ISSUED,FINAL    Transfusion Status OK TO TRANSFUSE   Glucose, capillary     Status: Abnormal   Collection Time: 01/07/18  8:05 AM  Result Value Ref Range   Glucose-Capillary 149 (H) 65 - 99 mg/dL   Comment 1 Notify RN    Comment 2 Document in Chart   Basic metabolic panel     Status: Abnormal   Collection Time: 01/07/18  8:37 AM  Result Value Ref Range   Sodium 135 135 - 145 mmol/L   Potassium 3.4 (L) 3.5 - 5.1 mmol/L   Chloride 111 101 - 111 mmol/L   CO2 17 (L) 22 - 32 mmol/L   Glucose, Bld 150 (H) 65 - 99 mg/dL   BUN 8 6 - 20 mg/dL   Creatinine, Ser <0.30 (L) 0.44 - 1.00 mg/dL   Calcium 7.4 (L) 8.9 - 10.3 mg/dL   GFR calc non Af Amer NOT CALCULATED >60 mL/min   GFR calc Af Amer NOT CALCULATED >60 mL/min    Comment: (NOTE) The eGFR has been calculated using the CKD EPI equation. This calculation has not been validated in all clinical situations. eGFR's persistently <60 mL/min signify possible Chronic Kidney Disease.    Anion gap 7 5 - 15  Erythropoietin     Status: Abnormal   Collection Time: 01/07/18  8:37 AM  Result Value Ref Range   Erythropoietin 213.1 (H) 2.6 - 18.5 mIU/mL    Comment: (NOTE) [employer] UniCel DxI 800 Immunoassay System Values obtained with different assay methods or kits cannot be used interchangeably. Results cannot be interpreted as absolute evidence of the presence or absence of malignant disease. Performed At: [facility] [place], [place] [id number] Rush Farmer MD TF:[id number]   Glucose, capillary     Status: Abnormal   Collection Time: 01/07/18  1:00 PM  Result Value Ref Range   Glucose-Capillary 209 (H) 65 - 99 mg/dL   Comment 1 Notify RN    Comment 2 Document in Chart   Aerobic/Anaerobic Culture (surgical/deep wound)     Status: None (Preliminary result)   Collection Time: 01/07/18  5:59 PM  Result Value Ref Range   Specimen Description LIVER BIOPSY    Special Requests NONE    Gram Stain      MODERATE WBC PRESENT, PREDOMINANTLY MONONUCLEAR NO ORGANISMS SEEN Performed at [facility], [place] [address] [place], [place] [address]    Culture PENDING    Report Status PENDING   Glucose, capillary     Status: Abnormal   Collection Time: 01/07/18  7:07  PM  Result Value Ref Range   Glucose-Capillary 150 (H) 65 - 99 mg/dL  Basic metabolic panel     Status: Abnormal   Collection Time: 01/07/18  7:43 PM  Result Value Ref Range   Sodium 134 (L) 135 - 145 mmol/L   Potassium 3.2 (L) 3.5 - 5.1 mmol/L   Chloride 108 101 - 111 mmol/L   CO2 16 (L) 22 - 32 mmol/L   Glucose, Bld 293 (H) 65 - 99 mg/dL   BUN 7 6 - 20 mg/dL   Creatinine, Ser <0.30 (L) 0.44 - 1.00 mg/dL   Calcium 7.8 (L) 8.9 - 10.3 mg/dL   GFR calc non Af Amer NOT CALCULATED >60 mL/min   GFR calc Af Amer NOT CALCULATED >60 mL/min    Comment: (NOTE) The eGFR has been calculated using the CKD EPI equation. This calculation has not been validated in all clinical situations. eGFR's persistently <60 mL/min signify possible Chronic Kidney Disease.    Anion gap 10 5 - 15  CBC with Differential/Platelet     Status: Abnormal   Collection Time: 01/07/18  9:19 PM  Result Value Ref Range   WBC 3.7 (L) 4.0 - 10.5 K/uL    Comment: WHITE COUNT CONFIRMED ON SMEAR   RBC 3.18 (L) 3.87 - 5.11 MIL/uL   Hemoglobin 8.6 (L) 12.0 - 15.0 g/dL    Comment: DELTA CHECK NOTED REPEATED TO VERIFY POST TRANSFUSION SPECIMEN    HCT  24.7 (L) 36.0 - 46.0 %   MCV 77.7 (L) 78.0 - 100.0 fL   MCH 27.0 26.0 - 34.0 pg   MCHC 34.8 30.0 - 36.0 g/dL   RDW 23.3 (H) 11.5 - 15.5 %   Platelets 41 (L) 150 - 400 K/uL    Comment: POST TRANSFUSION SPECIMEN REPEATED TO VERIFY SPECIMEN CHECKED FOR CLOTS PLATELET COUNT CONFIRMED BY SMEAR    Neutrophils Relative % 89 %   Neutro Abs 3.3 1.7 - 7.7 K/uL   Lymphocytes Relative 3 %   Lymphs Abs 0.1 0.7 - 4.0 K/uL   Monocytes Relative 8 %   Monocytes Absolute 0.3 0.1 - 1.0 K/uL   Eosinophils Relative 0 %   Eosinophils Absolute 0.0 0.0 - 0.7 K/uL   Basophils Relative 0 %   Basophils Absolute 0.0 0.0 - 0.1 K/uL   WBC Morphology TOXIC GRANULATION    RBC Morphology RARE NRBCs     Comment: TARGET CELLS  Glucose, capillary     Status: Abnormal   Collection Time: 01/07/18  9:19 PM  Result Value Ref Range   Glucose-Capillary 296 (H) 65 - 99 mg/dL  Glucose, capillary     Status: Abnormal   Collection Time: 01/07/18 11:18 PM  Result Value Ref Range   Glucose-Capillary 204 (H) 65 - 99 mg/dL  Basic metabolic panel     Status: Abnormal   Collection Time: 01/07/18 11:38 PM  Result Value Ref Range   Sodium 135 135 - 145 mmol/L   Potassium 3.2 (L) 3.5 - 5.1 mmol/L   Chloride 113 (H) 101 - 111 mmol/L   CO2 17 (L) 22 - 32 mmol/L   Glucose, Bld 225 (H) 65 - 99 mg/dL   BUN 8 6 - 20 mg/dL   Creatinine, Ser 0.41 (L) 0.44 - 1.00 mg/dL   Calcium 7.4 (L) 8.9 - 10.3 mg/dL   GFR calc non Af Amer >60 >60 mL/min   GFR calc Af Amer >60 >60 mL/min    Comment: (NOTE) The eGFR has been calculated  using the CKD EPI equation. This calculation has not been validated in all clinical situations. eGFR's persistently <60 mL/min signify possible Chronic Kidney Disease.    Anion gap 5 5 - 15  Type and screen [place]     Status: None (Preliminary result)   Collection Time: 01/08/18 12:37 AM  Result Value Ref Range   ABO/RH(D) A POS    Antibody Screen NEG    Sample Expiration 01/11/2018     Unit Number [id number]    Blood Component Type RBC LR PHER1    Unit division 00    Status of Unit ISSUED    Transfusion Status OK TO TRANSFUSE    Crossmatch Result COMPATIBLE    Unit Number [id number]    Blood Component Type RED CELLS,LR    Unit division 00    Status of Unit ISSUED    Transfusion Status OK TO TRANSFUSE    Crossmatch Result COMPATIBLE   Prepare RBC     Status: None   Collection Time: 01/08/18  2:00 AM  Result Value Ref Range   Order Confirmation ORDER PROCESSED BY BLOOD BANK   Basic metabolic panel     Status: Abnormal   Collection Time: 01/08/18  3:38 AM  Result Value Ref Range   Sodium 136 135 - 145 mmol/L   Potassium 3.4 (L) 3.5 - 5.1 mmol/L   Chloride 110 101 - 111 mmol/L   CO2 19 (L) 22 - 32 mmol/L   Glucose, Bld 158 (H) 65 - 99 mg/dL   BUN 9 6 - 20 mg/dL   Creatinine, Ser <0.30 (L) 0.44 - 1.00 mg/dL   Calcium 7.7 (L) 8.9 - 10.3 mg/dL   GFR calc non Af Amer NOT CALCULATED >60 mL/min   GFR calc Af Amer NOT CALCULATED >60 mL/min    Comment: (NOTE) The eGFR has been calculated using the CKD EPI equation. This calculation has not been validated in all clinical situations. eGFR's persistently <60 mL/min signify possible Chronic Kidney Disease.    Anion gap 7 5 - 15  Hepatic function panel     Status: Abnormal   Collection Time: 01/08/18  3:38 AM  Result Value Ref Range   Total Protein 4.8 (L) 6.5 - 8.1 g/dL   Albumin 1.8 (L) 3.5 - 5.0 g/dL   AST 110 (H) 15 - 41 U/L   ALT 40 14 - 54 U/L   Alkaline Phosphatase 414 (H) 38 - 126 U/L   Total Bilirubin 10.3 (H) 0.3 - 1.2 mg/dL   Bilirubin, Direct 6.2 (H) 0.1 - 0.5 mg/dL   Indirect Bilirubin 4.1 (H) 0.3 - 0.9 mg/dL  CBC with Differential/Platelet     Status: Abnormal   Collection Time: 01/08/18  3:38 AM  Result Value Ref Range   WBC 3.1 (L) 4.0 - 10.5 K/uL    Comment: WHITE COUNT CONFIRMED ON SMEAR   RBC 2.76 (L) 3.87 - 5.11 MIL/uL   Hemoglobin 7.4 (L) 12.0 - 15.0 g/dL   HCT 21.0 (L) 36.0 -  46.0 %   MCV 76.1 (L) 78.0 - 100.0 fL   MCH 26.8 26.0 - 34.0 pg   MCHC 35.2 30.0 - 36.0 g/dL   RDW 22.9 (H) 11.5 - 15.5 %   Platelets 37 (L) 150 - 400 K/uL    Comment: REPEATED TO VERIFY SPECIMEN CHECKED FOR CLOTS PLATELET COUNT CONFIRMED BY SMEAR CONSISTENT WITH PREVIOUS RESULT    Neutrophils Relative % 91 %   Neutro Abs 2.9 1.7 - 7.7  K/uL   Lymphocytes Relative 4 %   Lymphs Abs 0.1 0.7 - 4.0 K/uL   Monocytes Relative 4 %   Monocytes Absolute 0.1 0.1 - 1.0 K/uL   Eosinophils Relative 0 %   Eosinophils Absolute 0.0 0.0 - 0.7 K/uL   Basophils Relative 1 %   Basophils Absolute 0.0 0.0 - 0.1 K/uL   WBC Morphology TOXIC GRANULATION    RBC Morphology RARE NRBCs     Comment: TARGET CELLS  Protime-INR     Status: Abnormal   Collection Time: 01/08/18  3:38 AM  Result Value Ref Range   Prothrombin Time 16.4 (H) 11.4 - 15.2 seconds   INR 1.33   Glucose, capillary     Status: Abnormal   Collection Time: 01/08/18  7:30 AM  Result Value Ref Range   Glucose-Capillary 163 (H) 65 - 99 mg/dL   Comment 1 Notify RN    Comment 2 Document in Chart     Studies/Results: Ct Abdomen Pelvis W Contrast  Result Date: 01/06/2018 CLINICAL DATA:  Abdominal distension with elevated lactate EXAM: CT ABDOMEN AND PELVIS WITH CONTRAST TECHNIQUE: Multidetector CT imaging of the abdomen and pelvis was performed using the standard protocol following bolus administration of intravenous contrast. CONTRAST:  195m ISOVUE-300 IOPAMIDOL (ISOVUE-300) INJECTION 61% COMPARISON:  MRI 12/31/2017, CT abdomen pelvis 12/30/2017, 10/23/2017, 10/21/2017, 10/13/2017, 10/16/2016 FINDINGS: Lower chest: Lung bases demonstrate moderate pleural effusions, increased compared to prior. Partial consolidation in the lower lobes, atelectasis versus pneumonia. Mild cardiomegaly. Mild distal esophageal and GE junction thickening. Hepatobiliary: No focal hepatic abnormality. Surgical clips at the gallbladder fossa. No biliary dilatation  Pancreas: Decreased pancreatic edema.  No ductal dilatation Spleen: Interval enlargement of the spleen now measuring 14 cm on coronal views. Adrenals/Urinary Tract: Adrenal glands are unremarkable. Kidneys are normal, without renal calculi, focal lesion, or hydronephrosis. Bladder is unremarkable. Stomach/Bowel: Stomach is unremarkable. Fluid-filled loops of small bowel within the abdomen. No significant wall thickening. No intramural air. Normal appendix. No colon wall thickening. Vascular/Lymphatic: Nonaneurysmal aorta. Patent portal vein. Non enhancement of the splenic vein consistent with occlusion, new since the comparison CT. Enlarged retroperitoneal lymph nodes as before. Reproductive: Uterus and bilateral adnexa are unremarkable. Other: Large volume of ascites, increased compared to prior. No free air. Musculoskeletal: Degenerative changes. No acute or suspicious abnormality. IMPRESSION: 1. Moderate bilateral pleural effusions, increased compared to prior. Partial consolidations in the bilateral lower lobes may reflect atelectasis or pneumonia 2. 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 Portal vein remains patent. Interval enlargement of the spleen. 3. Decreased pancreatic edema. Large volume of ascites in the abdomen and pelvis, increased compared to prior. 4. Fluid-filled loops of central small bowel, possible ileus. No intramural air or portal venous gas. 5. Stable retroperitoneal lymphadenopathy Electronically Signed   By: KDonavan FoilM.D.   On: 01/06/2018 23:28   Ir Venogram Hepatic W Hemodynamic Evaluation  Result Date: 01/07/2018 INDICATION: 42year old female with a complex medical history including markedly abnormal liver enzymes. Request for liver biopsy. Patient has thrombocytopenia and received platelets prior to the procedure. EXAM: TRANSJUGULAR LIVER BIOPSY WITH FLUOROSCOPY HEPATIC VENOGRAPHY WITH PRESSURES ULTRASOUND GUIDANCE FOR  VASCULAR ACCESS MEDICATIONS: None. ANESTHESIA/SEDATION: Moderate (conscious) sedation was employed during this procedure. A total of Versed 1 mg and Fentanyl 25 mcg was administered intravenously. Moderate Sedation Time: 41 minutes. The patient's level of consciousness and vital signs were monitored continuously by radiology nursing throughout the procedure under my direct supervision. FLUOROSCOPY TIME:  Fluoroscopy Time: 7 minutes and 54 seconds. 201 mGy COMPLICATIONS: None immediate. PROCEDURE: Informed written consent was obtained from the patient after a thorough discussion of the procedural risks, benefits and alternatives. All questions were addressed. Maximal Sterile Barrier Technique was utilized including caps, mask, sterile gowns, sterile gloves, sterile drape, hand hygiene and skin antiseptic. A timeout was performed prior to the initiation of the procedure. Ultrasound confirmed a patent right internal jugular vein. The right side of the neck was prepped and draped in a sterile fashion. Skin was anesthetized with 1% lidocaine. 21 gauge needle directed into the right internal jugular vein with ultrasound guidance. Micropuncture dilator set was placed. Five French catheter was advanced into the IVC and a Bentson wire was placed. The tract was dilated to accommodate a 10 [place] sheath. 5 French catheter was advanced into a right hepatic vein. Hepatic venography was performed. Wedged and free hepatic vein pressures were obtained. The transjugular biopsy kit was then advanced through the sheath over a stiff Amplatz wire. Total of 5 core biopsies were performed. Four adequate core biopsies were obtained. Three specimens placed in formalin for pathology. One specimen was placed in saline for microbiology. Follow-up venography was performed through the 5 French catheter at of the procedure. Sheath were removed with manual compression. Fluoroscopic and ultrasound images were taken and saved for documentation.  FINDINGS: Two sets of hepatic venous pressures were obtained. The mean gradient on the first set of pressures was 7. The gradient on the second set of pressures was 6-7. Findings are suggestive for an elevated hepatic venous pressure gradient and concerning for portal hypertension. Core biopsies obtained from the right hepatic lobe. Four adequate specimens were obtained. No significant bleeding or extravasation on the final hepatic venography. IMPRESSION: Successful transjugular liver biopsy. Specimens sent to pathology and microbiology. Elevated hepatic venous pressure gradient. Electronically Signed   By: Markus Daft M.D.   On: 01/07/2018 18:17   Ir Transcatheter Bx  Result Date: 01/07/2018 INDICATION: 42 year old female with a complex medical history including markedly abnormal liver enzymes. Request for liver biopsy. Patient has thrombocytopenia and received platelets prior to the procedure. EXAM: TRANSJUGULAR LIVER BIOPSY WITH FLUOROSCOPY HEPATIC VENOGRAPHY WITH PRESSURES ULTRASOUND GUIDANCE FOR VASCULAR ACCESS MEDICATIONS: None. ANESTHESIA/SEDATION: Moderate (conscious) sedation was employed during this procedure. A total of Versed 1 mg and Fentanyl 25 mcg was administered intravenously. Moderate Sedation Time: 41 minutes. The patient's level of consciousness and vital signs were monitored continuously by radiology nursing throughout the procedure under my direct supervision. FLUOROSCOPY TIME:  Fluoroscopy Time: 7 minutes and 54 seconds. 007 mGy COMPLICATIONS: None immediate. PROCEDURE: Informed written consent was obtained from the patient after a thorough discussion of the procedural risks, benefits and alternatives. All questions were addressed. Maximal Sterile Barrier Technique was utilized including caps, mask, sterile gowns, sterile gloves, sterile drape, hand hygiene and skin antiseptic. A timeout was performed prior to the initiation of the procedure. Ultrasound confirmed a patent right internal  jugular vein. The right side of the neck was prepped and draped in a sterile fashion. Skin was anesthetized with 1% lidocaine. 21 gauge needle directed into the right internal jugular vein with ultrasound guidance. Micropuncture dilator set was placed. Five French catheter was advanced into the IVC and a Bentson wire was placed. The tract was dilated to accommodate a 10 [place] sheath. 5 French catheter was advanced into a right hepatic vein. Hepatic venography was performed. Wedged and free hepatic vein pressures were obtained. The transjugular biopsy kit was then  advanced through the sheath over a stiff Amplatz wire. Total of 5 core biopsies were performed. Four adequate core biopsies were obtained. Three specimens placed in formalin for pathology. One specimen was placed in saline for microbiology. Follow-up venography was performed through the 5 French catheter at of the procedure. Sheath were removed with manual compression. Fluoroscopic and ultrasound images were taken and saved for documentation. FINDINGS: Two sets of hepatic venous pressures were obtained. The mean gradient on the first set of pressures was 7. The gradient on the second set of pressures was 6-7. Findings are suggestive for an elevated hepatic venous pressure gradient and concerning for portal hypertension. Core biopsies obtained from the right hepatic lobe. Four adequate specimens were obtained. No significant bleeding or extravasation on the final hepatic venography. IMPRESSION: Successful transjugular liver biopsy. Specimens sent to pathology and microbiology. Elevated hepatic venous pressure gradient. Electronically Signed   By: Markus Daft M.D.   On: 01/07/2018 18:17    Medications: I have reviewed the patient's current medications.  Assessment: 1. Abnormal LFTs, bilirubin trending down, improvement in alkaline phosphatase? Medication induced, status post liver biopsy, no signs of encephalopathy, normal PT/INR Normal markers of  chronic liver disease-ceruloplasmin/alpha 1 antitrypsin/ANA/AMA/hep B surface antigen/hep C antibody, iron saturation  2. Esophageal stricture, Candida noted on biopsies, dilatation not performed due to severe esophagitis and underlying thrombocytopenia  3. Pancreatitis of unknown etiology, clinically doing well  4. Severe bradycardia(heart rate between 37-41/m)  5. Poorly controlled HIV, CD4 count of 8  6. Pancytopenia, mild improvement in thrombocytopenia  7. Hyperglycemia and acidosis  Plan: 1. Downtrend in LFTs reassuring, we will follow up on liver biopsy results.  2. Improvement in platelet count reassuring, patient has received IV steroids, platelets, and 1 dose of Nplate yesterday, plan EGD for dilatation when platelet around 50,000. Diet change to full liquid meanwhile. Continue PPI twice a day and [name] as prescribed.  3. Clinically pancreatitis seems to have resolved, abdominal pain fairly controlled, no further nausea vomiting.   Ronnette Juniper 01/08/2018, 9:32 AM   Pager [phone] If no answer or after 5 PM call [phone]

## 2018-01-08 NOTE — Progress Notes (Addendum)
PROGRESS NOTE  [name] AYO:[id number] DOB: 12-21-1975 DOA: 12/29/2017 PCP: Nolene Ebbs, MD  HPI/Recap of past 24 hours:  [name] is a 42 y.o. 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 S/p liver biopsy on 01/07/18    Overnight heavy pressure on left side of chest. No radiation. Associated with difficulty breathing requiring intermittent nasal canula.   During my exam today without current chest pain. Denies abdominal pain or cough. Trying to eat breakfast but amenable to changing diet to pureed.    Assessment/Plan: Principal Problem:   Pancreatitis Active Problems:   HIV disease ([place])   Bipolar disorder ([place])   Hypokalemia   Esophageal stricture   Dysphagia   Candidal esophagitis (H[facility])   Severe protein-calorie malnutrition (H[facility])   Abdominal pain   Lactic acidosis  Chest pain, resolved. Occurred overnight with some symptoms concerning for ACS. EKG overnight shows no ischemic changes and consistent with sinus arrhythmia.  Unclear etiology Monitor Mg and K and replete accordingly.   Severe Bradycardia (addendum), new.  No new medications to explain. Current medications seem less likely to cause low HR. Will continue to watch closely on telemetry. Obtain EKG to determine if sinus bradycardia or some concerning heart block or other arrhythmia.   Acute pancreatitis, unclear etiology, improving.  Initial lipase greater than 1000 on admission.  MRCP showed no obstructive etiology.  Triglycerides of 400, typically expect it to be greater than 1000 to contribute  significant pancreatitis so less likely etiology.  Possibly related to HIV pathology.  Additionally possible dapsone could contribute to pink otitis/cholestasis.  No abdominal pain currently, tolerating diet. Continue supportive care   Transaminitis, hyperbilirubinemia, elevated INR, concerning for liver failure, unclear etiology, improving. Status post liver biopsy on 01/07/18, follow histopathology.  Empirically started on MAC treatment per ID on 1/22 though presentation not entirely consistent (no diarrhea).  Dapsone and Bactrim has been discontinued in hospital course with concern it may be worsening liver synthetic function.  Autoimmune workup unrevealing so far (normal alpha-1 antitrypsin level, negative hepatitis C antibody, negative hepatitis B antibody, normal ceruloplasmin) pending antimitochondrial antibody and anti-smooth muscle antibody with GI following.  Previously temporarily on lactulose given previous concern for acute encephalopathy in the setting of likely worsening liver failure which has since resolved. Given improvement while holding multiple medications I wonder if DILI is a possibility we will still await pathology from biopsy  Acute on chronic anemia of chronic disease, stable. Required blood transfusion on 1/23. Completing transfusion today. Continue to monitor counts. 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  Given dose of Nplatelet by oncology  prior to the liver biopsy on 1/23. Continue Decadron. Platelets currently stable, will continue to monitor  Lactic acidosis, unclear etiology, resolved.  No ischemic bowel, no hypotension or hypoxia since admission, no DKA ( though briefly put on insulin gtt and transferred to stepdown unit because of concern).  Peak 7.4 improved to 2.4 considered D lactic acidosis in the setting of poor clearance due to poor liver function. Temporarily held biktarvy. PCCM  previously consulted. Plan to  transfer from stepdown to floor given off insulin for > 24 hours.  HIV, poorly controlled (CD4 count 8).  Continuing empiric MAC treatment with azithromycin ethambutol.  Follow-up AFB blood culture as we await path results from liver biopsy.  Continuing biktarvy  Esophageal candidiasis, stable.  Continue Anidulafungin, PPI and [name].  CMV stain was negative  Esophageal stricture. Dilation deferred in the setting of current thrombocytopenia. Change to pureed diet, continue to monitor  Steroid-induced hyperglycemia, stable.  Correction mealtime insulin.  Continue to monitor blood glucose levels   Code Status: Full code  Family Communication: No family at bedside  Disposition Plan: Pending liver biopsy, monitor liver function labs and hgb, transfer to floor   Consultants: Infectious disease, PCCM, gastroenterology, Oncology Procedures:  Liver biopsy 01/07/18  EEG on 01/06/18 mild diffuse slowing of cerebral activity nonspecific state (depression.  Toxic, metabolic, degenerative etiologies) no epileptiform activity recorded.  Antimicrobials:  Anidulafungin 1/21-1/22  Azithromycin 1/22>>  Ethambutol 1/23>>  Cefepime 1/18  Dapsone 1/17- 1/19  Fluconazole 1/15-1/19  Vancomycin 1/18  Cultures: Blood cultures x2, negative up-to-date (01/02/18) Urine culture 01/02/18, staph, coagulase negative, 40,000 colonies Esophageal biopsy 01/02/18, CMV negative Acid fast 1/20 1/19 x 2 negative growth to date Acid fast and cultures pending from liver biopsy on 01/07/18  DVT prophylaxis: SCDs   Objective: Vitals:   01/08/18 0629 01/08/18 0630 01/08/18 0700 01/08/18 0800  BP:  (!) 182/93 (!) 159/93   Pulse: (!) 45 (!) 41 (!) 41   Resp: 19 (!) 22 18   Temp: (!) 97.3 F (36.3 C) (!) 97.3 F (36.3 C)  (!) 97.5 F (36.4 C)  TempSrc: Oral Oral  Oral  SpO2: 95%  (!) 88%   Weight:      Height:        Intake/Output Summary (Last 24 hours) at 01/08/2018 0912 Last data filed at  01/08/2018 0532 Gross per 24 hour  Intake 6301.67 ml  Output 1700 ml  Net 4601.67 ml   Filed Weights   01/05/18 0504 01/06/18 0659 01/07/18 0401  Weight: 51.3 kg (113 lb 1.5 oz) 55.2 kg (121 lb 11.1 oz) 57.2 kg (126 lb 1.7 oz)    Exam:  General: Sitting up in bed eating breakfast, in no apparent distres Cardiovascular: regular rate and rhythm, no murmurs, rubs or gallops, , no edema Respiratory: Normal respiratory effort, lungs clear to auscultation bilaterally, no rales, wheezes or rhonchi Abdomen: soft, non-distended, non-tender, no guarding, rebound tenderness  Skin: No Rash Neurologic: Grossly no focal neuro deficit.Mental status AAOx3, Psychiatric: flat affect  Data Reviewed: CBC: Recent Labs  Lab 01/03/18 0625  01/05/18 1316 01/06/18 0557 01/07/18 0431 01/07/18 2119 01/08/18 0338  WBC 1.9*   < > 3.1* 2.9* 3.4* 3.7* 3.1*  NEUTROABS 1.5*  --   --  2.4 3.1 3.3 2.9  HGB 7.9*   < > 8.0* 7.6* 6.7* 8.6* 7.4*  HCT 23.3*   < > 23.8* 22.1* 19.2* 24.7* 21.0*  MCV 76.4*   < > 74.8* 75.9* 74.1* 77.7* 76.1*  PLT 24*   < > 15* 19* 23* 41* 37*   < > = values in this interval not displayed.   Basic Metabolic Panel: Recent Labs  Lab 01/06/18 0557  01/07/18 0431 01/07/18 0837 01/07/18 1943 01/07/18 2338 01/08/18 0338  NA 132*   < > 136 135 134* 135 136  K 3.4*   < > 3.5 3.4* 3.2* 3.2* 3.4*  CL 106   < > 114* 111 108 113* 110  CO2 18*   < > 19* 17* 16* 17* 19*  GLUCOSE  408*   < > 134* 150* 293* 225* 158*  BUN 12   < > _0 CREATININE 0.36*   < > <0.30* <0.30* <0.30* 0.41* <0.30*  CALCIUM 8.0*   < > 7.4* 7.4* 7.8* 7.4* 7.7*  MG 1.6*  --   --   --   --   --   --    < > = values in this interval not displayed.   GFR: CrCl cannot be calculated (This lab value cannot be used to calculate CrCl because it is not a number: <0.30). Liver Function Tests: Recent Labs  Lab 01/04/18 0534 01/05/18 0527 01/06/18 0557 01/07/18 0431 01/08/18 0338  AST 236* 234* 205* 181*  110*  ALT 37 40 45 48 40  ALKPHOS 570* 510* 515* 425* 414*  BILITOT 14.8* 16.7* 17.0* 12.8* 10.3*  PROT 4.6* 4.6* 4.5* 4.2* 4.8*  ALBUMIN 1.5* 1.5* 1.5* 1.3* 1.8*   Recent Labs  Lab 01/02/18 0725 01/03/18 0619  LIPASE 86* 65*   Recent Labs  Lab 01/05/18 0901  AMMONIA 43*   Coagulation Profile: Recent Labs  Lab 01/02/18 0507 01/04/18 0534 01/06/18 0557 01/07/18 0431 01/08/18 0338  INR 1.31 1.38 2.02 1.83 1.33   Cardiac Enzymes: No results for input(s): CKTOTAL, CKMB, CKMBINDEX, TROPONINI in the last 168 hours. BNP (last 3 results) No results for input(s): PROBNP in the last 8760 hours. HbA1C: No results for input(s): HGBA1C in the last 72 hours. CBG: Recent Labs  Lab 01/07/18 1300 01/07/18 1907 01/07/18 2119 01/07/18 2318 01/08/18 0730  GLUCAP 209* 150* 296* 204* 163*   Lipid Profile: No results for input(s): CHOL, HDL, LDLCALC, TRIG, CHOLHDL, LDLDIRECT in the last 72 hours. Thyroid Function Tests: No results for input(s): TSH, T4TOTAL, FREET4, T3FREE, THYROIDAB in the last 72 hours. Anemia Panel: Recent Labs    01/06/18 0557  RETICCTPCT 2.1   Urine analysis:    Component Value Date/Time   COLORURINE AMBER (A) 01/06/2018 1702   APPEARANCEUR HAZY (A) 01/06/2018 1702   LABSPEC 1.017 01/06/2018 1702   PHURINE 6.0 01/06/2018 1702   GLUCOSEU >=500 (A) 01/06/2018 1702   HGBUR SMALL (A) 01/06/2018 1702   BILIRUBINUR MODERATE (A) 01/06/2018 1702   KETONESUR NEGATIVE 01/06/2018 1702   PROTEINUR NEGATIVE 01/06/2018 1702   UROBILINOGEN 0.2 12/07/2014 1441   NITRITE NEGATIVE 01/06/2018 1702   LEUKOCYTESUR NEGATIVE 01/06/2018 1702   Sepsis Labs: _1 (procalcitonin:4,lacticidven:4)  ) Recent Results (from the past 240 hour(s))  Culture, blood (routine x 2)     Status: None   Collection Time: 01/02/18  7:24 AM  Result Value Ref Range Status   Specimen Description BLOOD LEFT HAND  Final   Special Requests IN PEDIATRIC BOTTLE Blood Culture adequate  volume  Final   Culture   Final    NO GROWTH 5 DAYS Performed at [facility], [place] [address], [place], [place] [address]    Report Status 01/07/2018 FINAL  Final  Culture, blood (routine x 2)     Status: None   Collection Time: 01/02/18  7:25 AM  Result Value Ref Range Status   Specimen Description BLOOD RIGHT ANTECUBITAL  Final   Special Requests   Final    BOTTLES DRAWN AEROBIC AND ANAEROBIC Blood Culture adequate volume   Culture   Final    NO GROWTH 5 DAYS Performed at [facility], [place] [address] [place], [place] [address]    Report Status 01/07/2018 FINAL  Final  Cytomegalovirus (CMV)  Culture     Status: None   Collection Time: 01/02/18  8:50 AM  Result Value Ref Range Status   Cytomegalovirus (CMV) Culture Comment  Final    Comment: (NOTE) No Cytomegalovirus detected in the shell vial culture. Conventional tissue culture results to follow. Performed At: [facility] [place], [place] [id number] Rush Farmer MD EX:[id number]    Source of Sample ESOPHAGUS  Final    Comment: BIOSPY  Respiratory Panel by PCR     Status: None   Collection Time: 01/02/18 10:42 AM  Result Value Ref Range Status   Adenovirus NOT DETECTED NOT DETECTED Final   Coronavirus 229E NOT DETECTED NOT DETECTED Final   Coronavirus HKU1 NOT DETECTED NOT DETECTED Final   Coronavirus NL63 NOT DETECTED NOT DETECTED Final   Coronavirus OC43 NOT DETECTED NOT DETECTED Final   Metapneumovirus NOT DETECTED NOT DETECTED Final   Rhinovirus / Enterovirus NOT DETECTED NOT DETECTED Final   Influenza A NOT DETECTED NOT DETECTED Final   Influenza B NOT DETECTED NOT DETECTED Final   Parainfluenza Virus 1 NOT DETECTED NOT DETECTED Final   Parainfluenza Virus 2 NOT DETECTED NOT DETECTED Final   Parainfluenza Virus 3 NOT DETECTED NOT DETECTED Final   Parainfluenza Virus 4 NOT DETECTED NOT DETECTED Final   Respiratory Syncytial Virus NOT DETECTED NOT DETECTED Final   Bordetella  pertussis NOT DETECTED NOT DETECTED Final   Chlamydophila pneumoniae NOT DETECTED NOT DETECTED Final   Mycoplasma pneumoniae NOT DETECTED NOT DETECTED Final  Culture, Urine     Status: Abnormal   Collection Time: 01/02/18 12:41 PM  Result Value Ref Range Status   Specimen Description URINE, CLEAN CATCH  Final   Special Requests NONE  Final   Culture (A)  Final    40,000 COLONIES/mL STAPHYLOCOCCUS SPECIES (COAGULASE NEGATIVE)   Report Status 01/04/2018 FINAL  Final   Organism ID, Bacteria STAPHYLOCOCCUS SPECIES (COAGULASE NEGATIVE) (A)  Final      Susceptibility   Staphylococcus species (coagulase negative) - MIC*    CIPROFLOXACIN <=0.5 SENSITIVE Sensitive     GENTAMICIN <=0.5 SENSITIVE Sensitive     NITROFURANTOIN <=16 SENSITIVE Sensitive     OXACILLIN >=4 RESISTANT Resistant     TETRACYCLINE >=16 RESISTANT Resistant     VANCOMYCIN 1 SENSITIVE Sensitive     TRIMETH/SULFA <=10 SENSITIVE Sensitive     CLINDAMYCIN RESISTANT Resistant     RIFAMPIN <=0.5 SENSITIVE Sensitive     Inducible Clindamycin POSITIVE Resistant     * 40,000 COLONIES/mL STAPHYLOCOCCUS SPECIES (COAGULASE NEGATIVE)  MRSA PCR Screening     Status: None   Collection Time: 01/06/18  6:59 PM  Result Value Ref Range Status   MRSA by PCR NEGATIVE NEGATIVE Final    Comment:        The GeneXpert MRSA Assay (FDA approved for NASAL specimens only), is one component of a comprehensive MRSA colonization surveillance program. It is not intended to diagnose MRSA infection nor to guide or monitor treatment for MRSA infections.   Aerobic/Anaerobic Culture (surgical/deep wound)     Status: None (Preliminary result)   Collection Time: 01/07/18  5:59 PM  Result Value Ref Range Status   Specimen Description LIVER BIOPSY  Final   Special Requests NONE  Final   Gram Stain   Final    MODERATE WBC PRESENT, PREDOMINANTLY MONONUCLEAR NO ORGANISMS SEEN Performed at [facility], [place] [address], [place], [place] [address]      Culture PENDING  Incomplete  Report Status PENDING  Incomplete      Studies: Ir Venogram Hepatic W Hemodynamic Evaluation  Result Date: 01/07/2018 INDICATION: 42 year old female with a complex medical history including markedly abnormal liver enzymes. Request for liver biopsy. Patient has thrombocytopenia and received platelets prior to the procedure. EXAM: TRANSJUGULAR LIVER BIOPSY WITH FLUOROSCOPY HEPATIC VENOGRAPHY WITH PRESSURES ULTRASOUND GUIDANCE FOR VASCULAR ACCESS MEDICATIONS: None. ANESTHESIA/SEDATION: Moderate (conscious) sedation was employed during this procedure. A total of Versed 1 mg and Fentanyl 25 mcg was administered intravenously. Moderate Sedation Time: 41 minutes. The patient's level of consciousness and vital signs were monitored continuously by radiology nursing throughout the procedure under my direct supervision. FLUOROSCOPY TIME:  Fluoroscopy Time: 7 minutes and 54 seconds. 324 mGy COMPLICATIONS: None immediate. PROCEDURE: Informed written consent was obtained from the patient after a thorough discussion of the procedural risks, benefits and alternatives. All questions were addressed. Maximal Sterile Barrier Technique was utilized including caps, mask, sterile gowns, sterile gloves, sterile drape, hand hygiene and skin antiseptic. A timeout was performed prior to the initiation of the procedure. Ultrasound confirmed a patent right internal jugular vein. The right side of the neck was prepped and draped in a sterile fashion. Skin was anesthetized with 1% lidocaine. 21 gauge needle directed into the right internal jugular vein with ultrasound guidance. Micropuncture dilator set was placed. Five French catheter was advanced into the IVC and a Bentson wire was placed. The tract was dilated to accommodate a 10 [place] sheath. 5 French catheter was advanced into a right hepatic vein. Hepatic venography was performed. Wedged and free hepatic vein pressures were obtained. The  transjugular biopsy kit was then advanced through the sheath over a stiff Amplatz wire. Total of 5 core biopsies were performed. Four adequate core biopsies were obtained. Three specimens placed in formalin for pathology. One specimen was placed in saline for microbiology. Follow-up venography was performed through the 5 French catheter at of the procedure. Sheath were removed with manual compression. Fluoroscopic and ultrasound images were taken and saved for documentation. FINDINGS: Two sets of hepatic venous pressures were obtained. The mean gradient on the first set of pressures was 7. The gradient on the second set of pressures was 6-7. Findings are suggestive for an elevated hepatic venous pressure gradient and concerning for portal hypertension. Core biopsies obtained from the right hepatic lobe. Four adequate specimens were obtained. No significant bleeding or extravasation on the final hepatic venography. IMPRESSION: Successful transjugular liver biopsy. Specimens sent to pathology and microbiology. Elevated hepatic venous pressure gradient. Electronically Signed   By: Markus Daft M.D.   On: 01/07/2018 18:17   Ir Transcatheter Bx  Result Date: 01/07/2018 INDICATION: 42 year old female with a complex medical history including markedly abnormal liver enzymes. Request for liver biopsy. Patient has thrombocytopenia and received platelets prior to the procedure. EXAM: TRANSJUGULAR LIVER BIOPSY WITH FLUOROSCOPY HEPATIC VENOGRAPHY WITH PRESSURES ULTRASOUND GUIDANCE FOR VASCULAR ACCESS MEDICATIONS: None. ANESTHESIA/SEDATION: Moderate (conscious) sedation was employed during this procedure. A total of Versed 1 mg and Fentanyl 25 mcg was administered intravenously. Moderate Sedation Time: 41 minutes. The patient's level of consciousness and vital signs were monitored continuously by radiology nursing throughout the procedure under my direct supervision. FLUOROSCOPY TIME:  Fluoroscopy Time: 7 minutes and 54  seconds. 401 mGy COMPLICATIONS: None immediate. PROCEDURE: Informed written consent was obtained from the patient after a thorough discussion of the procedural risks, benefits and alternatives. All questions were addressed. Maximal Sterile Barrier Technique was utilized including caps, mask, sterile gowns, sterile gloves,  sterile drape, hand hygiene and skin antiseptic. A timeout was performed prior to the initiation of the procedure. Ultrasound confirmed a patent right internal jugular vein. The right side of the neck was prepped and draped in a sterile fashion. Skin was anesthetized with 1% lidocaine. 21 gauge needle directed into the right internal jugular vein with ultrasound guidance. Micropuncture dilator set was placed. Five French catheter was advanced into the IVC and a Bentson wire was placed. The tract was dilated to accommodate a 10 [place] sheath. 5 French catheter was advanced into a right hepatic vein. Hepatic venography was performed. Wedged and free hepatic vein pressures were obtained. The transjugular biopsy kit was then advanced through the sheath over a stiff Amplatz wire. Total of 5 core biopsies were performed. Four adequate core biopsies were obtained. Three specimens placed in formalin for pathology. One specimen was placed in saline for microbiology. Follow-up venography was performed through the 5 French catheter at of the procedure. Sheath were removed with manual compression. Fluoroscopic and ultrasound images were taken and saved for documentation. FINDINGS: Two sets of hepatic venous pressures were obtained. The mean gradient on the first set of pressures was 7. The gradient on the second set of pressures was 6-7. Findings are suggestive for an elevated hepatic venous pressure gradient and concerning for portal hypertension. Core biopsies obtained from the right hepatic lobe. Four adequate specimens were obtained. No significant bleeding or extravasation on the final hepatic venography.  IMPRESSION: Successful transjugular liver biopsy. Specimens sent to pathology and microbiology. Elevated hepatic venous pressure gradient. Electronically Signed   By: Markus Daft M.D.   On: 01/07/2018 18:17    Scheduled Meds: . bictegravir-emtricitabine-tenofovir AF  1 tablet Oral Daily  . dexamethasone  40 mg Oral Daily  . ethambutol  15 mg/kg Oral Daily  . feeding supplement  1 Container Oral TID BM  . folic acid  2 mg Oral Daily  . insulin aspart  0-9 Units Subcutaneous TID WC  . iopamidol  30 mL Oral Once  . multivitamin with minerals  1 tablet Oral Daily  . nystatin  5 mL Oral QID  . pantoprazole (PROTONIX) IV  40 mg Intravenous Q12H  . phytonadione  10 mg Subcutaneous Daily  . sucralfate  1 g Oral TID WC & HS    Continuous Infusions: . sodium chloride 150 mL/hr at 01/08/18 0400  . sodium chloride    . anidulafungin Stopped (01/06/18 1825)  . azithromycin Stopped (01/07/18 2000)     LOS: 9 days     Desiree Hane, MD [facility] Pager [phone]  If 7PM-7AM, please contact night-coverage [email or web address] Password [email or web address] 01/08/2018, 9:12 AM

## 2018-01-08 NOTE — Progress Notes (Signed)
[name] seems to be improving slowly.  Her liver tests are doing much better.  Her liver function tests keep coming down.  Today, her bilirubin is down to 10.3.  She got 1 unit of blood before her biopsy.  She got the platelets.  I do not think she got any FFP.  She did get blood and FFP after her procedure.  She had a transjugular liver biopsy.  Given the fact that her liver tests are improving, I would not think that there is malignancy (i.e. lymphoma) that is causing the issue with her elevated bilirubin.  I did give her a dose of Nplate yesterday.  I think she is on Decadron.  Her blood sugars are doing better right now.  She still is on antibiotics.  She is on her HIV medications.  Her HIV levels are still incredibly high.  Her HIV RNA is 25,500.  As I have always felt, as long as her HIV is not controlled, her blood counts will not improve.  I talked her today about staying on her HIV medications when she gets out of the hospital.  I think this is the only way that her blood counts will normalize.  Her erythropoietin level is 213.  As such, I am not sure that ESA is going to help.  On her physical exam, she is afebrile.  Her pulse is down to 41.  Her blood pressure is 182/93.  Her lungs sound clear.  Cardiac is slow but regular.  Abdomen is slightly distended.  Bowel sounds are decreased.  Extremities shows no clubbing, cyanosis or edema.  Hopefully, she will be able to move out of the ICU soon.  Again, if her HIV is under control, her blood counts will get better.  I do not see that she has to have any blood products right now.  We will see what her blood count is tomorrow.  I am impressed with the wonderful care that she is getting by all the staff down in the ICU.  [name], MD  2 Timothy 1:5-7

## 2018-01-09 DIAGNOSIS — R109 Unspecified abdominal pain: Secondary | ICD-10-CM

## 2018-01-09 DIAGNOSIS — D500 Iron deficiency anemia secondary to blood loss (chronic): Secondary | ICD-10-CM

## 2018-01-09 DIAGNOSIS — R001 Bradycardia, unspecified: Secondary | ICD-10-CM

## 2018-01-09 LAB — HEPATIC FUNCTION PANEL
ALK PHOS: 456 U/L — AB (ref 38–126)
ALT: 42 U/L (ref 14–54)
AST: 96 U/L — ABNORMAL HIGH (ref 15–41)
Albumin: 1.7 g/dL — ABNORMAL LOW (ref 3.5–5.0)
BILIRUBIN DIRECT: 7.1 mg/dL — AB (ref 0.1–0.5)
Indirect Bilirubin: 4.5 mg/dL — ABNORMAL HIGH (ref 0.3–0.9)
Total Bilirubin: 11.6 mg/dL — ABNORMAL HIGH (ref 0.3–1.2)
Total Protein: 5.1 g/dL — ABNORMAL LOW (ref 6.5–8.1)

## 2018-01-09 LAB — HIV 1/2 AB DIFFERENTIATION
HIV 1 Ab: POSITIVE — AB
HIV 2 AB: NEGATIVE

## 2018-01-09 LAB — BPAM RBC
BLOOD PRODUCT EXPIRATION DATE: 201902072359
Blood Product Expiration Date: 201902022359
ISSUE DATE / TIME: 201901240416
ISSUE DATE / TIME: 201901240608
Unit Type and Rh: 6200
Unit Type and Rh: 6200

## 2018-01-09 LAB — PREPARE FRESH FROZEN PLASMA
UNIT DIVISION: 0
Unit division: 0

## 2018-01-09 LAB — CBC WITH DIFFERENTIAL/PLATELET
BASOS PCT: 0 %
Basophils Absolute: 0 10*3/uL (ref 0.0–0.1)
Eosinophils Absolute: 0 10*3/uL (ref 0.0–0.7)
Eosinophils Relative: 0 %
HCT: 31.6 % — ABNORMAL LOW (ref 36.0–46.0)
Hemoglobin: 11.3 g/dL — ABNORMAL LOW (ref 12.0–15.0)
LYMPHS PCT: 0 %
Lymphs Abs: 0 10*3/uL — ABNORMAL LOW (ref 0.7–4.0)
MCH: 27.6 pg (ref 26.0–34.0)
MCHC: 35.8 g/dL (ref 30.0–36.0)
MCV: 77.3 fL — AB (ref 78.0–100.0)
MONO ABS: 0.3 10*3/uL (ref 0.1–1.0)
Monocytes Relative: 8 %
NEUTROS ABS: 3.4 10*3/uL (ref 1.7–7.7)
NRBC: 5 /100{WBCs} — AB
Neutrophils Relative %: 92 %
PLATELETS: 46 10*3/uL — AB (ref 150–400)
RBC: 4.09 MIL/uL (ref 3.87–5.11)
RDW: 20.9 % — ABNORMAL HIGH (ref 11.5–15.5)
WBC: 3.7 10*3/uL — ABNORMAL LOW (ref 4.0–10.5)

## 2018-01-09 LAB — TYPE AND SCREEN
ABO/RH(D): A POS
ANTIBODY SCREEN: NEGATIVE
Unit division: 0
Unit division: 0

## 2018-01-09 LAB — BASIC METABOLIC PANEL
Anion gap: 7 (ref 5–15)
BUN: 10 mg/dL (ref 6–20)
CHLORIDE: 106 mmol/L (ref 101–111)
CO2: 22 mmol/L (ref 22–32)
Calcium: 7.7 mg/dL — ABNORMAL LOW (ref 8.9–10.3)
Creatinine, Ser: <0.3 mg/dL — ABNORMAL LOW (ref 0.44–1.00)
GLUCOSE: 191 mg/dL — AB (ref 65–99)
POTASSIUM: 3.1 mmol/L — AB (ref 3.5–5.1)
Sodium: 135 mmol/L (ref 135–145)

## 2018-01-09 LAB — BPAM FFP
BLOOD PRODUCT EXPIRATION DATE: 201901282359
Blood Product Expiration Date: 201901282359
ISSUE DATE / TIME: 201901240158
ISSUE DATE / TIME: 201901240023
Unit Type and Rh: 6200
Unit Type and Rh: 6200

## 2018-01-09 LAB — GLUCOSE, CAPILLARY
GLUCOSE-CAPILLARY: 161 mg/dL — AB (ref 65–99)
Glucose-Capillary: 211 mg/dL — ABNORMAL HIGH (ref 65–99)
Glucose-Capillary: 121 mg/dL — ABNORMAL HIGH (ref 65–99)
Glucose-Capillary: 160 mg/dL — ABNORMAL HIGH (ref 65–99)

## 2018-01-09 LAB — PROTIME-INR
INR: 1
Prothrombin Time: 13.1 seconds (ref 11.4–15.2)

## 2018-01-09 LAB — CYTOMEGALOVIRUS (CMV) CULTURE - CMVCUL

## 2018-01-09 LAB — MAGNESIUM: Magnesium: 1.9 mg/dL (ref 1.7–2.4)

## 2018-01-09 LAB — HIV ANTIBODY (ROUTINE TESTING W REFLEX): HIV SCREEN 4TH GENERATION: REACTIVE — AB

## 2018-01-09 MED ORDER — POTASSIUM CHLORIDE 10 MEQ/100ML IV SOLN
10.0000 meq | INTRAVENOUS | Status: AC
  Administered 2018-01-09 (×4): 10 meq via INTRAVENOUS
  Filled 2018-01-09 (×4): qty 100

## 2018-01-09 MED ORDER — HYDRALAZINE HCL 20 MG/ML IJ SOLN
10.0000 mg | INTRAMUSCULAR | Status: DC | PRN
  Administered 2018-01-09 – 2018-01-19 (×2): 10 mg via INTRAVENOUS
  Filled 2018-01-09 (×2): qty 1

## 2018-01-09 MED ORDER — POTASSIUM CHLORIDE CRYS ER 20 MEQ PO TBCR: 40.0000 meq | EXTENDED_RELEASE_TABLET | Freq: Once | ORAL | Status: DC

## 2018-01-09 MED ORDER — POTASSIUM CHLORIDE 20 MEQ PO PACK: 40.0000 meq | PACK | Freq: Once | ORAL | Status: DC

## 2018-01-09 NOTE — Progress Notes (Signed)
PT Cancellation Note  Patient Details Name: SHELBEY SPINDLER MRN: [id number] DOB: [birth year]   Cancelled Treatment:    Reason Eval/Treat Not Completed: Patient declined, no reason specified. 2nd refusal in a row. Pt has refused to participate with PT on multiple occasions. If a 3rd refusal occurs tomorrow, will consider signing off. Thanks.    Weston Anna, MPT Pager: [phone]

## 2018-01-09 NOTE — Progress Notes (Signed)
PROGRESS NOTE  [name] YQM:[id number] DOB: 01-24-1976 DOA: 12/29/2017 PCP: Nolene Ebbs, MD  HPI/Recap of past 24 hours:  [name] is a 42 y.o. 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 S/p liver biopsy on 01/07/18    No chest pain overnight.  Reports difficulty swallowing bigger pills, declines to try oral potassium.  Had some abdominal pain last night otherwise no acute complaints  Assessment/Plan: Principal Problem:   Pancreatitis Active Problems:   HIV disease ([place])   Bipolar disorder ([place])   Hypokalemia   Esophageal stricture   Dysphagia   Candidal esophagitis (HCC)   Severe protein-calorie malnutrition ([facility])   Abdominal pain   Lactic acidosis  Chest pain, resolved.  Occurred once.  EKG at time of evaluation on 1/24 showed no acute ischemic changes, will continue to monitor.  Severe sinus bradycardia , new, improving likely related to antibiotics for MAC therapy.  ID recommended to discontinue, especially considering current presentation is not consistent with MAC.  We will continue to monitor.  Acute pancreatitis, unclear etiology, improving.  Initial lipase greater than 1000 on admission.  MRCP showed no obstructive etiology.  Triglycerides of 400, typically expect it to be greater than 1000 to contribute significant pancreatitis so less likely etiology.  Possibly related to HIV pathology.  Additionally possible dapsone could contribute to pink otitis/cholestasis.  Doing okay with liquid diet, hopeful patient will be able to receive esophageal dilation  his platelets increased.  Transaminitis, hyperbilirubinemia, elevated INR, concerning for liver failure, unclear etiology, stable Slight increase increase in total bilirubin, otherwise AST ALT and alk phos relatively stable.  Status post liver biopsy on 01/07/18, follow histopathology.  Now off MAC therapy given above-mentioned sinus bradycardia.  As mentioned inprevious progress notes prior medications have been discontinued to determine if associated with poor liver function.  Autoimmune workup to date has been negative.  Will need to follow up liver biopsy.    Acute on chronic anemia of chronic disease, stable. Required blood transfusion on 1/23.  Not likely accurate value may be element of hemoconcentration, unclear amount of blood given in last 24-48 hours it would seem like more than one unit.  Continue to monitor counts. 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  Given dose of Nplatelet by oncology  prior to the liver biopsy. platelets of 46.Marland Kitchen Continue Decadron.  will continue to monitor  Lactic acidosis, unclear etiology, resolved.  No ischemic bowel, no hypotension or hypoxia since admission, no DKA ( though briefly put on insulin gtt and transferred to stepdown unit because of concern).  Peak 7.4 improved to 2.4 considered D lactic acidosis in the setting of poor clearance due to poor liver function. Temporarily held biktarvy. PCCM  previously consulted. Plan to transfer from stepdown to floor given off insulin for > 24 hours.   HIV, poorly controlled (CD4 count 8).  Continuing empiric MAC treatment with azithromycin ethambutol.  Follow-up AFB blood culture as we await path results from liver biopsy.  Continuing biktarvy  Esophageal candidiasis, stable.  Continue Anidulafungin, PPI and [name].  CMV stain was negative  Esophageal stricture. Dilation deferred in the setting of current  thrombocytopenia. Puree Diet, Per Gi recs:  anticipate EGD with dilation when  platelets consistently around 50,000. continue to monitor  Steroid-induced hyperglycemia, stable.  Correction mealtime insulin.  Continue to monitor blood glucose levels  Hypokalemia/.hypomagnesemia. status post magnesium repletion on 1/24.  Will require IV potassium repletion as patient unable to tolerate p.o. given size of pills making it difficult for swallowing with esophageal stricture.  No overt emesis back I am aware of that would explain hypokalemia.  Magnesium yesterday normal after repletion, will repeat mag levels today   Code Status: Full code  Family Communication: No family at bedside  Disposition Plan: f/u liver biopsy, monitor liver function labs and hgb, potential EGD   Consultants: Infectious disease, PCCM, gastroenterology, Oncology Procedures:  Liver biopsy 01/07/18  EEG on 01/06/18 mild diffuse slowing of cerebral activity nonspecific state (depression.  Toxic, metabolic, degenerative etiologies) no epileptiform activity recorded.  Antimicrobials:  Anidulafungin 1/21-1/22  Azithromycin 1/22>>  Ethambutol 1/23>>  Cefepime 1/18  Dapsone 1/17- 1/19  Fluconazole 1/15-1/19  Vancomycin 1/18  Cultures: Blood cultures x2, negative up-to-date (01/02/18) Urine culture 01/02/18, staph, coagulase negative, 40,000 colonies Esophageal biopsy 01/02/18, CMV negative Acid fast 1/20 1/19 x 2 negative growth to date Acid fast and cultures pending from liver biopsy on 01/07/18  DVT prophylaxis: SCDs   Objective: Vitals:   01/08/18 1800 01/08/18 2000 01/08/18 2230 01/09/18 0450  BP: (!) 174/93  (!) 135/91 (!) 164/89  Pulse: (!) 46  (!) 53 (!) 48  Resp: 19  18 18   Temp:  97.6 F (36.4 C) 97.7 F (36.5 C) (!) 97.4 F (36.3 C)  TempSrc:  Oral Oral Oral  SpO2: 96%   98%  Weight:      Height:        Intake/Output Summary (Last 24 hours) at 01/09/2018 1420 Last data filed at 01/09/2018 1149 Gross per 24 hour  Intake 3000 ml    Output 1750 ml  Net 1250 ml   Filed Weights   01/05/18 0504 01/06/18 0659 01/07/18 0401  Weight: 51.3 kg (113 lb 1.5 oz) 55.2 kg (121 lb 11.1 oz) 57.2 kg (126 lb 1.7 oz)    Exam:  General: Lying in bed in no distress, eyes closed during examination.  Cardiovascular:  no edema Respiratory: Normal respiratory effort, no respiratory distress Abdomen: soft, non-distended, non-tender, no guarding, rebound tenderness  Skin: No Rash Neurologic: Grossly no focal neuro deficit.Mental status AAOx3, Psychiatric: flat affect  Data Reviewed: CBC: Recent Labs  Lab 01/06/18 0557 01/07/18 0431 01/07/18 2119 01/08/18 0338 01/09/18 0613  WBC 2.9* 3.4* 3.7* 3.1* 3.7*  NEUTROABS 2.4 3.1 3.3 2.9 3.4  HGB 7.6* 6.7* 8.6* 7.4* 11.3*  HCT 22.1* 19.2* 24.7* 21.0* 31.6*  MCV 75.9* 74.1* 77.7* 76.1* 77.3*  PLT 19* 23* 41* 37* 46*   Basic Metabolic Panel: Recent Labs  Lab 01/06/18 0557  01/07/18 0837 01/07/18 1943 01/07/18 2338 01/08/18 0338 01/08/18 1828 01/09/18 0613  NA 132*   < > 135 134* 135 136  --  135  K 3.4*   < > 3.4* 3.2* 3.2* 3.4*  --  3.1*  CL 106   < > 111 108 113* 110  --  106  CO2 18*   < > 17* 16* 17* 19*  --  22  GLUCOSE 408*   < > 150* 293* 225* 158*  --  191*  BUN 12   < > 8 7 8 9   --  10  CREATININE 0.36*   < > <0.30* <  0.30* 0.41* <0.30*  --  <0.30*  CALCIUM 8.0*   < > 7.4* 7.8* 7.4* 7.7*  --  7.7*  MG 1.6*  --   --   --   --   --  2.4  --    < > = values in this interval not displayed.   GFR: CrCl cannot be calculated (This lab value cannot be used to calculate CrCl because it is not a number: <0.30). Liver Function Tests: Recent Labs  Lab 01/05/18 0527 01/06/18 0557 01/07/18 0431 01/08/18 0338 01/09/18 0613  AST 234* 205* 181* 110* 96*  ALT 40 45 48 40 42  ALKPHOS 510* 515* 425* 414* 456*  BILITOT 16.7* 17.0* 12.8* 10.3* 11.6*  PROT 4.6* 4.5* 4.2* 4.8* 5.1*  ALBUMIN 1.5* 1.5* 1.3* 1.8* 1.7*   Recent Labs  Lab 01/03/18 0619  LIPASE 65*   Recent  Labs  Lab 01/05/18 0901  AMMONIA 43*   Coagulation Profile: Recent Labs  Lab 01/04/18 0534 01/06/18 0557 01/07/18 0431 01/08/18 0338 01/09/18 0613  INR 1.38 2.02 1.83 1.33 1.00   Cardiac Enzymes: No results for input(s): CKTOTAL, CKMB, CKMBINDEX, TROPONINI in the last 168 hours. BNP (last 3 results) No results for input(s): PROBNP in the last 8760 hours. HbA1C: No results for input(s): HGBA1C in the last 72 hours. CBG: Recent Labs  Lab 01/08/18 1157 01/08/18 1606 01/08/18 2035 01/09/18 0759 01/09/18 1215  GLUCAP 150* 154* 147* 211* 161*   Lipid Profile: No results for input(s): CHOL, HDL, LDLCALC, TRIG, CHOLHDL, LDLDIRECT in the last 72 hours. Thyroid Function Tests: No results for input(s): TSH, T4TOTAL, FREET4, T3FREE, THYROIDAB in the last 72 hours. Anemia Panel: No results for input(s): VITAMINB12, FOLATE, FERRITIN, TIBC, IRON, RETICCTPCT in the last 72 hours. Urine analysis:    Component Value Date/Time   COLORURINE AMBER (A) 01/06/2018 1702   APPEARANCEUR HAZY (A) 01/06/2018 1702   LABSPEC 1.017 01/06/2018 1702   PHURINE 6.0 01/06/2018 1702   GLUCOSEU >=500 (A) 01/06/2018 1702   HGBUR SMALL (A) 01/06/2018 1702   BILIRUBINUR MODERATE (A) 01/06/2018 1702   KETONESUR NEGATIVE 01/06/2018 1702   PROTEINUR NEGATIVE 01/06/2018 1702   UROBILINOGEN 0.2 12/07/2014 1441   NITRITE NEGATIVE 01/06/2018 1702   LEUKOCYTESUR NEGATIVE 01/06/2018 1702   Sepsis Labs: @LABRCNTIP (procalcitonin:4,lacticidven:4)  ) Recent Results (from the past 240 hour(s))  Culture, blood (routine x 2)     Status: None   Collection Time: 01/02/18  7:24 AM  Result Value Ref Range Status   Specimen Description BLOOD LEFT HAND  Final   Special Requests IN PEDIATRIC BOTTLE Blood Culture adequate volume  Final   Culture   Final    NO GROWTH 5 DAYS Performed at [facility], [place] [address], [place], [place] [address]    Report Status 01/07/2018 FINAL  Final  Culture, blood (routine  x 2)     Status: None   Collection Time: 01/02/18  7:25 AM  Result Value Ref Range Status   Specimen Description BLOOD RIGHT ANTECUBITAL  Final   Special Requests   Final    BOTTLES DRAWN AEROBIC AND ANAEROBIC Blood Culture adequate volume   Culture   Final    NO GROWTH 5 DAYS Performed at [facility], [place] [address] [place], [place] [address]    Report Status 01/07/2018 FINAL  Final  Cytomegalovirus (CMV) Culture     Status: None   Collection Time: 01/02/18  8:50 AM  Result Value Ref Range Status  Cytomegalovirus (CMV) Culture Comment  Final    Comment: (NOTE) No Cytomegalovirus detected in the shell vial culture. Conventional tissue culture results to follow. Performed At: [facility] [place], [place] [id number] Rush Farmer MD WU:[id number]    Source of Sample ESOPHAGUS  Final    Comment: BIOSPY  Respiratory Panel by PCR     Status: None   Collection Time: 01/02/18 10:42 AM  Result Value Ref Range Status   Adenovirus NOT DETECTED NOT DETECTED Final   Coronavirus 229E NOT DETECTED NOT DETECTED Final   Coronavirus HKU1 NOT DETECTED NOT DETECTED Final   Coronavirus NL63 NOT DETECTED NOT DETECTED Final   Coronavirus OC43 NOT DETECTED NOT DETECTED Final   Metapneumovirus NOT DETECTED NOT DETECTED Final   Rhinovirus / Enterovirus NOT DETECTED NOT DETECTED Final   Influenza A NOT DETECTED NOT DETECTED Final   Influenza B NOT DETECTED NOT DETECTED Final   Parainfluenza Virus 1 NOT DETECTED NOT DETECTED Final   Parainfluenza Virus 2 NOT DETECTED NOT DETECTED Final   Parainfluenza Virus 3 NOT DETECTED NOT DETECTED Final   Parainfluenza Virus 4 NOT DETECTED NOT DETECTED Final   Respiratory Syncytial Virus NOT DETECTED NOT DETECTED Final   Bordetella pertussis NOT DETECTED NOT DETECTED Final   Chlamydophila pneumoniae NOT DETECTED NOT DETECTED Final   Mycoplasma pneumoniae NOT DETECTED NOT DETECTED Final  Culture, Urine     Status: Abnormal    Collection Time: 01/02/18 12:41 PM  Result Value Ref Range Status   Specimen Description URINE, CLEAN CATCH  Final   Special Requests NONE  Final   Culture (A)  Final    40,000 COLONIES/mL STAPHYLOCOCCUS SPECIES (COAGULASE NEGATIVE)   Report Status 01/04/2018 FINAL  Final   Organism ID, Bacteria STAPHYLOCOCCUS SPECIES (COAGULASE NEGATIVE) (A)  Final      Susceptibility   Staphylococcus species (coagulase negative) - MIC*    CIPROFLOXACIN <=0.5 SENSITIVE Sensitive     GENTAMICIN <=0.5 SENSITIVE Sensitive     NITROFURANTOIN <=16 SENSITIVE Sensitive     OXACILLIN >=4 RESISTANT Resistant     TETRACYCLINE >=16 RESISTANT Resistant     VANCOMYCIN 1 SENSITIVE Sensitive     TRIMETH/SULFA <=10 SENSITIVE Sensitive     CLINDAMYCIN RESISTANT Resistant     RIFAMPIN <=0.5 SENSITIVE Sensitive     Inducible Clindamycin POSITIVE Resistant     * 40,000 COLONIES/mL STAPHYLOCOCCUS SPECIES (COAGULASE NEGATIVE)  MRSA PCR Screening     Status: None   Collection Time: 01/06/18  6:59 PM  Result Value Ref Range Status   MRSA by PCR NEGATIVE NEGATIVE Final    Comment:        The GeneXpert MRSA Assay (FDA approved for NASAL specimens only), is one component of a comprehensive MRSA colonization surveillance program. It is not intended to diagnose MRSA infection nor to guide or monitor treatment for MRSA infections.   Aerobic/Anaerobic Culture (surgical/deep wound)     Status: None (Preliminary result)   Collection Time: 01/07/18  5:59 PM  Result Value Ref Range Status   Specimen Description LIVER BIOPSY  Final   Special Requests NONE  Final   Gram Stain   Final    MODERATE WBC PRESENT, PREDOMINANTLY MONONUCLEAR NO ORGANISMS SEEN    Culture   Final    NO GROWTH 2 DAYS NO ANAEROBES ISOLATED; CULTURE IN PROGRESS FOR 5 DAYS Performed at [facility], [place] [address], [place], [place] [address]    Report Status PENDING  Incomplete  Studies: No results found.  Scheduled Meds: .  bictegravir-emtricitabine-tenofovir AF  1 tablet Oral Daily  . dexamethasone  40 mg Oral Daily  . feeding supplement  1 Container Oral TID BM  . folic acid  2 mg Oral Daily  . insulin aspart  0-9 Units Subcutaneous TID WC  . iopamidol  30 mL Oral Once  . multivitamin with minerals  1 tablet Oral Daily  . nystatin  5 mL Oral QID  . pantoprazole (PROTONIX) IV  40 mg Intravenous Q12H  . phytonadione  10 mg Subcutaneous Daily  . sucralfate  1 g Oral TID WC & HS    Continuous Infusions: . sodium chloride 150 mL/hr at 01/09/18 0957  . sodium chloride    . anidulafungin Stopped (01/08/18 2014)  . potassium chloride Stopped (01/09/18 1259)     LOS: 10 days     Desiree Hane, MD [facility] Pager [phone]  If 7PM-7AM, please contact night-coverage [email or web address] Password [email or web address] 01/09/2018, 2:20 PM

## 2018-01-09 NOTE — Progress Notes (Signed)
Patient states she is having trouble swallowing her pills. She has refused most of her morning medications. MD notified. Will continue to monitor patient.

## 2018-01-09 NOTE — Progress Notes (Signed)
[facility]    Date of Admission:  12/29/2017      ID: [name] is a 42 y.o. female with  HIV poorly controlled. eso candidiasis, ITP, with eso stricture admitted for pancreatitis Principal Problem:   Pancreatitis Active Problems:   HIV disease ([place])   Bipolar disorder ([place])   Hypokalemia   Esophageal stricture   Dysphagia   Candidal esophagitis ([facility])   Severe protein-calorie malnutrition ([facility])   Abdominal pain   Lactic acidosis   Sinus bradycardia    Subjective: Has some abdominal distention. Wants to eat solid/semi formed foods instead of liquids. No fevers  Medications:  . bictegravir-emtricitabine-tenofovir AF  1 tablet Oral Daily  . dexamethasone  40 mg Oral Daily  . feeding supplement  1 Container Oral TID BM  . folic acid  2 mg Oral Daily  . insulin aspart  0-9 Units Subcutaneous TID WC  . iopamidol  30 mL Oral Once  . multivitamin with minerals  1 tablet Oral Daily  . nystatin  5 mL Oral QID  . pantoprazole (PROTONIX) IV  40 mg Intravenous Q12H  . phytonadione  10 mg Subcutaneous Daily  . sucralfate  1 g Oral TID WC & HS    Objective: Vital signs in last 24 hours: Temp:  [97.4 F (36.3 C)-97.7 F (36.5 C)] 97.4 F (36.3 C) (01/25 0450) Pulse Rate:  [46-53] 48 (01/25 0450) Resp:  [18-19] 18 (01/25 0450) BP: (135-174)/(89-93) 164/89 (01/25 0450) SpO2:  [96 %-98 %] 98 % (01/25 0450) Physical Exam  Constitutional:  oriented to person, place, and time. appears chronically ill, malnourished No distress.  HENT: [place]/AT, PERRLA, no scleral icterus Mouth/Throat: Oropharynx is clear and moist. No oropharyngeal exudate.  Cardiovascular: Normal rate, regular rhythm and normal heart sounds. Exam reveals no gallop and no friction rub.  No murmur heard.  Pulmonary/Chest: Effort normal and breath sounds normal. No respiratory distress.  has no wheezes.  Neck = supple, no nuchal rigidity Abdominal: Soft. Bowel sounds are decreased.  Mild distension. Mild tenderness.  Lymphadenopathy: no cervical adenopathy. No axillary adenopathy Neurological: alert and oriented to person, place, and time.  Skin: Skin is warm and dry. No rash noted. No erythema.  Psychiatric: a normal mood and affect.  behavior is normal.    Lab Results Recent Labs    01/08/18 0338 01/09/18 0613  WBC 3.1* 3.7*  HGB 7.4* 11.3*  HCT 21.0* 31.6*  NA 136 135  K 3.4* 3.1*  CL 110 106  CO2 19* 22  BUN 9 10  CREATININE <0.30* <0.30*   Liver Panel Recent Labs    01/08/18 0338 01/09/18 0613  PROT 4.8* 5.1*  ALBUMIN 1.8* 1.7*  AST 110* 96*  ALT 40 42  ALKPHOS 414* 456*  BILITOT 10.3* 11.6*  BILIDIR 6.2* 7.1*  IBILI 4.1* 4.5*    Microbiology: reviewed Studies/Results: Ir Venogram Hepatic W Hemodynamic Evaluation  Result Date: 01/07/2018 INDICATION: 42 year old female with a complex medical history including markedly abnormal liver enzymes. Request for liver biopsy. Patient has thrombocytopenia and received platelets prior to the procedure. EXAM: TRANSJUGULAR LIVER BIOPSY WITH FLUOROSCOPY HEPATIC VENOGRAPHY WITH PRESSURES ULTRASOUND GUIDANCE FOR VASCULAR ACCESS MEDICATIONS: None. ANESTHESIA/SEDATION: Moderate (conscious) sedation was employed during this procedure. A total of Versed 1 mg and Fentanyl 25 mcg was administered intravenously. Moderate Sedation Time: 41 minutes. The patient's level of consciousness and vital signs were monitored continuously by radiology nursing throughout the procedure under my direct supervision. FLUOROSCOPY TIME:  Fluoroscopy Time: 7 minutes and 54 seconds. 341 mGy COMPLICATIONS: None immediate. PROCEDURE: Informed written consent was obtained from the patient after a thorough discussion of the procedural risks, benefits and alternatives. All questions were addressed. Maximal Sterile Barrier Technique was utilized including caps, mask, sterile gowns, sterile gloves, sterile drape, hand hygiene and skin antiseptic. A  timeout was performed prior to the initiation of the procedure. Ultrasound confirmed a patent right internal jugular vein. The right side of the neck was prepped and draped in a sterile fashion. Skin was anesthetized with 1% lidocaine. 21 gauge needle directed into the right internal jugular vein with ultrasound guidance. Micropuncture dilator set was placed. Five French catheter was advanced into the IVC and a Bentson wire was placed. The tract was dilated to accommodate a 10 [place] sheath. 5 French catheter was advanced into a right hepatic vein. Hepatic venography was performed. Wedged and free hepatic vein pressures were obtained. The transjugular biopsy kit was then advanced through the sheath over a stiff Amplatz wire. Total of 5 core biopsies were performed. Four adequate core biopsies were obtained. Three specimens placed in formalin for pathology. One specimen was placed in saline for microbiology. Follow-up venography was performed through the 5 French catheter at of the procedure. Sheath were removed with manual compression. Fluoroscopic and ultrasound images were taken and saved for documentation. FINDINGS: Two sets of hepatic venous pressures were obtained. The mean gradient on the first set of pressures was 7. The gradient on the second set of pressures was 6-7. Findings are suggestive for an elevated hepatic venous pressure gradient and concerning for portal hypertension. Core biopsies obtained from the right hepatic lobe. Four adequate specimens were obtained. No significant bleeding or extravasation on the final hepatic venography. IMPRESSION: Successful transjugular liver biopsy. Specimens sent to pathology and microbiology. Elevated hepatic venous pressure gradient. Electronically Signed   By: Markus Daft M.D.   On: 01/07/2018 18:17   Ir Transcatheter Bx  Result Date: 01/07/2018 INDICATION: 42 year old female with a complex medical history including markedly abnormal liver enzymes. Request for  liver biopsy. Patient has thrombocytopenia and received platelets prior to the procedure. EXAM: TRANSJUGULAR LIVER BIOPSY WITH FLUOROSCOPY HEPATIC VENOGRAPHY WITH PRESSURES ULTRASOUND GUIDANCE FOR VASCULAR ACCESS MEDICATIONS: None. ANESTHESIA/SEDATION: Moderate (conscious) sedation was employed during this procedure. A total of Versed 1 mg and Fentanyl 25 mcg was administered intravenously. Moderate Sedation Time: 41 minutes. The patient's level of consciousness and vital signs were monitored continuously by radiology nursing throughout the procedure under my direct supervision. FLUOROSCOPY TIME:  Fluoroscopy Time: 7 minutes and 54 seconds. 937 mGy COMPLICATIONS: None immediate. PROCEDURE: Informed written consent was obtained from the patient after a thorough discussion of the procedural risks, benefits and alternatives. All questions were addressed. Maximal Sterile Barrier Technique was utilized including caps, mask, sterile gowns, sterile gloves, sterile drape, hand hygiene and skin antiseptic. A timeout was performed prior to the initiation of the procedure. Ultrasound confirmed a patent right internal jugular vein. The right side of the neck was prepped and draped in a sterile fashion. Skin was anesthetized with 1% lidocaine. 21 gauge needle directed into the right internal jugular vein with ultrasound guidance. Micropuncture dilator set was placed. Five French catheter was advanced into the IVC and a Bentson wire was placed. The tract was dilated to accommodate a 10 [place] sheath. 5 French catheter was advanced into a right hepatic vein. Hepatic venography was performed. Wedged and free hepatic vein pressures were obtained. The transjugular biopsy kit was then  advanced through the sheath over a stiff Amplatz wire. Total of 5 core biopsies were performed. Four adequate core biopsies were obtained. Three specimens placed in formalin for pathology. One specimen was placed in saline for microbiology. Follow-up  venography was performed through the 5 French catheter at of the procedure. Sheath were removed with manual compression. Fluoroscopic and ultrasound images were taken and saved for documentation. FINDINGS: Two sets of hepatic venous pressures were obtained. The mean gradient on the first set of pressures was 7. The gradient on the second set of pressures was 6-7. Findings are suggestive for an elevated hepatic venous pressure gradient and concerning for portal hypertension. Core biopsies obtained from the right hepatic lobe. Four adequate specimens were obtained. No significant bleeding or extravasation on the final hepatic venography. IMPRESSION: Successful transjugular liver biopsy. Specimens sent to pathology and microbiology. Elevated hepatic venous pressure gradient. Electronically Signed   By: Markus Daft M.D.   On: 01/07/2018 18:17     Assessment/Plan: hiv disease = continue with biktarvy. VL 25,000 on this admission, improved from the 1MU copies from 2 months ago.  eso candidiasis = continue with anidulafungin  eso dilatation = awaiting for platelets ct> 50 to safely do procedure, defering to GI, possibly mon/tuesday if counts continue to improve  ITP = plt at 40s, continues to improve. Recommend to continue with steroids  Hyperbilirubinemia/transaminitis = today slight increase in hyperbilirubinemia. Continue to check hepatic panel. Unclear etiology. She underwent liver biopsy, path is still pending.   Bradycardia = HR in the 50s, fairly new onset in the last 4 days. Unclear if it was due to azithromycin which has now been discontinued. Continue with telemetry. Would check other meds if involved. Continue with keep mg at 2 and potassium at [facility] Cell: [phone] Pager: [phone]  01/09/2018, 5:27 PM

## 2018-01-10 DIAGNOSIS — R001 Bradycardia, unspecified: Secondary | ICD-10-CM

## 2018-01-10 DIAGNOSIS — E871 Hypo-osmolality and hyponatremia: Secondary | ICD-10-CM

## 2018-01-10 LAB — CBC WITH DIFFERENTIAL/PLATELET
BASOS ABS: 0 10*3/uL (ref 0.0–0.1)
Basophils Relative: 0 %
EOS ABS: 0 10*3/uL (ref 0.0–0.7)
EOS PCT: 0 %
HCT: 31.8 % — ABNORMAL LOW (ref 36.0–46.0)
Hemoglobin: 11.5 g/dL — ABNORMAL LOW (ref 12.0–15.0)
Lymphocytes Relative: 3 %
Lymphs Abs: 0.1 10*3/uL — ABNORMAL LOW (ref 0.7–4.0)
MCH: 27.8 pg (ref 26.0–34.0)
MCHC: 36.2 g/dL — ABNORMAL HIGH (ref 30.0–36.0)
MCV: 77 fL — AB (ref 78.0–100.0)
MONO ABS: 0.3 10*3/uL (ref 0.1–1.0)
Monocytes Relative: 9 %
NEUTROS PCT: 88 %
Neutro Abs: 2.9 10*3/uL (ref 1.7–7.7)
PLATELETS: 60 10*3/uL — AB (ref 150–400)
RBC: 4.13 MIL/uL (ref 3.87–5.11)
RDW: 21.6 % — ABNORMAL HIGH (ref 11.5–15.5)
WBC: 3.3 10*3/uL — AB (ref 4.0–10.5)

## 2018-01-10 LAB — BASIC METABOLIC PANEL
ANION GAP: 5 (ref 5–15)
BUN: 10 mg/dL (ref 6–20)
CO2: 19 mmol/L — ABNORMAL LOW (ref 22–32)
Calcium: 7 mg/dL — ABNORMAL LOW (ref 8.9–10.3)
Chloride: 103 mmol/L (ref 101–111)
Creatinine, Ser: <0.3 mg/dL — ABNORMAL LOW (ref 0.44–1.00)
Glucose, Bld: 133 mg/dL — ABNORMAL HIGH (ref 65–99)
POTASSIUM: 3 mmol/L — AB (ref 3.5–5.1)
SODIUM: 127 mmol/L — AB (ref 135–145)

## 2018-01-10 LAB — HEPATIC FUNCTION PANEL
ALBUMIN: 1.6 g/dL — AB (ref 3.5–5.0)
ALT: 46 U/L (ref 14–54)
AST: 100 U/L — ABNORMAL HIGH (ref 15–41)
Alkaline Phosphatase: 437 U/L — ABNORMAL HIGH (ref 38–126)
BILIRUBIN TOTAL: 11.3 mg/dL — AB (ref 0.3–1.2)
Bilirubin, Direct: 6.6 mg/dL — ABNORMAL HIGH (ref 0.1–0.5)
Indirect Bilirubin: 4.7 mg/dL — ABNORMAL HIGH (ref 0.3–0.9)
Total Protein: 4.5 g/dL — ABNORMAL LOW (ref 6.5–8.1)

## 2018-01-10 LAB — GLUCOSE, CAPILLARY
GLUCOSE-CAPILLARY: 141 mg/dL — AB (ref 65–99)
GLUCOSE-CAPILLARY: 150 mg/dL — AB (ref 65–99)
Glucose-Capillary: 110 mg/dL — ABNORMAL HIGH (ref 65–99)
Glucose-Capillary: 149 mg/dL — ABNORMAL HIGH (ref 65–99)

## 2018-01-10 LAB — PROTIME-INR
INR: 1.02
PROTHROMBIN TIME: 13.3 s (ref 11.4–15.2)

## 2018-01-10 MED ORDER — POTASSIUM CHLORIDE 10 MEQ/100ML IV SOLN
10.0000 meq | INTRAVENOUS | Status: AC
  Administered 2018-01-10 (×4): 10 meq via INTRAVENOUS
  Filled 2018-01-10 (×4): qty 100

## 2018-01-10 MED ORDER — OXYCODONE HCL 5 MG PO TABS
5.0000 mg | ORAL_TABLET | Freq: Four times a day (QID) | ORAL | Status: DC | PRN
  Administered 2018-01-10 – 2018-01-20 (×18): 5 mg via ORAL
  Filled 2018-01-10 (×18): qty 1

## 2018-01-10 NOTE — Progress Notes (Signed)
Overall, patient is stable to slightly improved.  1.  Acute pancreatitis: She presented with this approximately 10 days ago, and has had radiographic, biochemical, and clinical improvement.  Most recent lipase, a week ago, was almost down to the normal range (at 65), or follow-up CT showed interval decrease in pancreatic edema, and the patient is quite interested in eating.  Etiology remains obscure; MRCP was negative for stones or obstruction.  2.  Reflux esophagitis with Candida on biopsies.  Endoscopic photographs and biopsy report reviewed.  Based on that information, I tend to think that the Candida was saprophytic rather than true invasive esophageal candidiasis.  The pattern of inflammation in the distal esophagus is very compatible with reflux, which would be also compatible with the esophageal stricturing at that location.  Is on twice daily PPI therapy.  I do not believe the patient is having chest pain or odynophagia at this time.  3.  Esophageal stricture with dysphagia.  The patient has asked to be upgraded to regular food instead of pured food.  We will see how this goes.  Given that the aperture of the strictured area of the esophagus was probably less than 7 or 8 mm, the patient will probably have significant dysphagia symptoms, although her PPI therapy will probably promote distal esophageal healing and relaxation.  4.  Elevated liver chemistries.  These appear to be hovering with an AST of 100, normal ALT, alk phos in the 400-450 range, bilirubin of around 11.  Liver biopsy from 3 days ago is pending but will hopefully be ready on Monday.  INR remains normal and patient is not overtly encephalopathic.  No evidence of fulminant hepatic failure.  Suspect drug induced hepatic injury.  5.  Thrombocytopenia.  Improving on therapy, currently 65,000.  Recommendations:  1.  No further evaluation for pancreatitis, although I will recheck a lipase level on Monday, as food is reintroduced to  the patient's diet 2.  Continue twice daily PPI therapy.  Okay to switch to oral, if the patient is able to tolerate solid food 3.  Eventual dilatation of the esophagus.  The platelet count is now approaching a level that would make a conservative balloon dilatation of her esophageal stricture reasonably safe with respect to bleeding risk, although it would be ideal to see the platelet count above 90,000 before doing so.  We will reassess the patient on Monday and possibly arrange dilatation at that time.  Will tentatively hold the patient n.p.o. Sunday night, to allow potential EGD and dilatation on Monday if appropriate.  In the meantime, have advised patient to chop her food, especially meat, into very small pieces, roughly the size of a pea.  I described to her the tight esophageal stricture diameter encountered during her recent endoscopy. 4.  Await liver biopsy results, although I think there is a high probability that they will not disclose a discrete etiology  Cleotis Nipper, M.D. Pager [phone] If no answer or after 5 PM call [phone]

## 2018-01-10 NOTE — Progress Notes (Signed)
PT Cancellation Note / Sign off  Patient Details Name: [name] MRN: [id number] DOB: 08/23/1976   Cancelled Treatment:    Reason Eval/Treat Not Completed: Patient declined, no reason specified Pt declined to participate again today stating she just received pain meds and wanted to sleep.  Pt reports she does want to mobilize however not at this time.  Pt reports poor timing with PT.  Discussed coordinating mobility first with nursing and attempting ambulating and have PT reordered if pt needed more assistance. Pt agreeable to this plan.  RN also notified that pt declined to participate for 3rd time and aware PT discussed attempting pt mobility with nursing first when pt feels ready.  Summarizing: NURSING please attempt to mobilize pt when she is agreeable.  Please reorder if apparent skilled PT needs are required.  PT to sign off at this time due to 3rd patient cancel.   [name],KATHrine E 01/10/2018, 4:13 PM Carmelia Bake, PT, DPT 01/10/2018 Pager: [phone]

## 2018-01-10 NOTE — Progress Notes (Signed)
PROGRESS NOTE  [name] HBZ:[id number] DOB: Oct 08, 1976 DOA: 12/29/2017 PCP: Nolene Ebbs, MD  HPI/Recap of past 24 hours:  [name] is a 42 y.o. 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 S/p liver biopsy on 01/07/18    Wants to increase her diet to regular as she complains of no difficulty swallowing (except with big pills, like potassium).   Assessment/Plan: Principal Problem:   Pancreatitis Active Problems:   HIV disease ([place])   Bipolar disorder ([place])   Hypokalemia   Esophageal stricture   Dysphagia   Candidal esophagitis ([facility])   Severe protein-calorie malnutrition ([facility])   Abdominal pain   Lactic acidosis   Sinus bradycardia  Asymptomatic hyponatremia, new Na currently 127 ( wnl yesterday).  Patient has been on IVF for quite some time, albeit she has had limited po intake with her dysphagia. It would seem hypovolemic in nature however it wouldn't make sense to occur while on IVF.  No medications on list that could be contributing Will monitor off IVF and with regular po intake to see if improves on own.   Hypokalemia, persistent.   Will require IV potassium repletion as patient unable to tolerate p.o. given size of pills making it difficult for swallowing with suspected esophageal stricture.  No overt emesis that I am aware of that would explain hypokalemia.  Magnesium yesterday normal, difficult to replete potassium Intravenously. Will continue to monitor  Chest pain, resolved.  Occurred once.  EKG at time of evaluation on 1/24 showed no acute ischemic  changes, will continue to monitor.  Severe sinus bradycardia , new, improving. Possibly related to antibiotics for MAC therapy (discontinued on 1/24 by ID), since not on Beta-blockers or other known meds.  We will continue to monitor.  Acute pancreatitis, unclear etiology, improving.  Initial lipase greater than 1000 on admission.  MRCP showed no obstructive etiology.  Triglycerides of 400, typically expect it to be greater than 1000 to contribute significant pancreatitis so less likely etiology.  Possibly related to HIV pathology.  Additionally possible dapsone could contribute to pink otitis/cholestasis.  Doing okay with pureed diet but would like to try regular diet.  Transaminitis, hyperbilirubinemia, elevated INR, concerning for liver failure, unclear etiology, stable  Status post liver biopsy on 01/07/18, follow histopathology.  Now off MAC therapy given above-mentioned sinus bradycardia.  As mentioned inprevious progress notes prior medications have been discontinued to determine if associated with poor liver function.  Autoimmune workup to date has been negative.  Will need to follow up liver biopsy.    Acute on chronic anemia of chronic disease, stable. hgb stable at 11. Required blood transfusion on 1/23.    Continue to monitor counts. 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  Platelets of 60.Marland Kitchen Continue Decadron.  will continue to monitor  Lactic acidosis, unclear etiology, resolved.  No ischemic bowel, no hypotension or hypoxia since admission, no DKA ( though briefly put on insulin gtt and transferred to stepdown unit because of concern).  Peak 7.4 improved to 2.4 considered D lactic acidosis in the setting of poor clearance due to poor liver function. Temporarily held biktarvy. PCCM  previously  consulted.   HIV, poorly controlled (CD4 count 8).    Follow-up AFB blood culture as we await path results from liver biopsy.   Continuing biktarvy  Esophageal candidiasis, stable.  Continue Anidulafungin, PPI and [name].  CMV stain was negative  Esophageal stricture. Dilation deferred in the setting of current thrombocytopenia. Patient elects for regular diet, Per Gi recs: anticipate EGD with dilation when  platelets consistently around 50,000(currently in 60s, so hopefully can occur soon). continue to monitor  Steroid-induced hyperglycemia, stable.  Correction mealtime insulin.  Continue to monitor blood glucose levels  Hypomagnesemia. Resolved   Code Status: Full code  Family Communication: Family updated at bedside  Disposition Plan: f/u liver biopsy, monitor sodium, monitor liver function labs and hgb, potential EGD?   Consultants: Infectious disease, PCCM, gastroenterology, Oncology Procedures:  Liver biopsy 01/07/18  EEG on 01/06/18 mild diffuse slowing of cerebral activity nonspecific state (depression.  Toxic, metabolic, degenerative etiologies) no epileptiform activity recorded.  Antimicrobials:  Anidulafungin 1/21-1/22  Azithromycin 1/22>>1/24  Ethambutol 1/23>>1/24  Cefepime 1/18  Dapsone 1/17- 1/19  Fluconazole 1/15-1/19  Vancomycin 1/18  Cultures: Blood cultures x2, negative up-to-date (01/02/18) Urine culture 01/02/18, staph, coagulase negative, 40,000 colonies Esophageal biopsy 01/02/18, CMV negative Acid fast 1/20 1/19 x 2 negative growth to date Acid fast and cultures pending from liver biopsy on 01/07/18  DVT prophylaxis: SCDs   Objective: Vitals:   01/08/18 2230 01/09/18 0450 01/09/18 2143 01/10/18 0522  BP: (!) 135/91 (!) 164/89 (!) 180/91 (!) 146/97  Pulse: (!) 53 (!) 48 (!) 50 (!) 55  Resp: _0 Temp: 97.7 F (36.5 C) (!) 97.4 F (36.3 C) 97.8 F (36.6 C) 97.7 F (36.5 C)  TempSrc: Oral Oral Oral Oral  SpO2:  98% 99% 97%  Weight:      Height:        Intake/Output Summary (Last 24 hours) at 01/10/2018 1352 Last data filed at 01/10/2018 0550 Gross  per 24 hour  Intake 4220 ml  Output 1900 ml  Net 2320 ml   Filed Weights   01/05/18 0504 01/06/18 0659 01/07/18 0401  Weight: 51.3 kg (113 lb 1.5 oz) 55.2 kg (121 lb 11.1 oz) 57.2 kg (126 lb 1.7 oz)    Exam:  General: Lying in bed in no distress, pleasant in conversation Cardiovascular: RRR, no m,r,or gallops, no edema Respiratory: Normal respiratory effort, no respiratory distress Abdomen: soft, non-distended, non-tender, no guarding, no rebound tenderness  Skin: No Rash Neurologic: Grossly no focal neuro deficit.Mental status AAOx3, Psychiatric: improved affect, normal mood  Data Reviewed: CBC: Recent Labs  Lab 01/07/18 0431 01/07/18 2119 01/08/18 0338 01/09/18 0613 01/10/18 0602  WBC 3.4* 3.7* 3.1* 3.7* 3.3*  NEUTROABS 3.1 3.3 2.9 3.4 2.9  HGB 6.7* 8.6* 7.4* 11.3* 11.5*  HCT 19.2* 24.7* 21.0* 31.6* 31.8*  MCV 74.1* 77.7* 76.1* 77.3* 77.0*  PLT 23* 41* 37* 46* 60*   Basic Metabolic Panel: Recent Labs  Lab 01/06/18 0557  01/07/18 1943 01/07/18 2338 01/08/18 0338 01/08/18 1828 01/09/18 0613 01/10/18 0602  NA 132*   < > 134* 135 136  --  135 127*  K 3.4*   < > 3.2* 3.2* 3.4*  --  3.1* 3.0*  CL 106   < > 108 113* 110  --  106 103  CO2 18*   < > 16* 17* 19*  --  22 19*  GLUCOSE 408*   < > 293* 225* 158*  --  191* 133*  BUN 12   < >  _0 --  10 10  CREATININE 0.36*   < > <0.30* 0.41* <0.30*  --  <0.30* <0.30*  CALCIUM 8.0*   < > 7.8* 7.4* 7.7*  --  7.7* 7.0*  MG 1.6*  --   --   --   --  2.4 1.9  --    < > = values in this interval not displayed.   GFR: CrCl cannot be calculated (This lab value cannot be used to calculate CrCl because it is not a number: <0.30). Liver Function Tests: Recent Labs  Lab 01/06/18 0557 01/07/18 0431 01/08/18 0338 01/09/18 0613 01/10/18 0602  AST 205* 181* 110* 96* 100*  ALT 45 48 40 42 46  ALKPHOS 515* 425* 414* 456* 437*  BILITOT 17.0* 12.8* 10.3* 11.6* 11.3*  PROT 4.5* 4.2* 4.8* 5.1* 4.5*  ALBUMIN 1.5* 1.3* 1.8* 1.7*  1.6*   No results for input(s): LIPASE, AMYLASE in the last 168 hours. Recent Labs  Lab 01/05/18 0901  AMMONIA 43*   Coagulation Profile: Recent Labs  Lab 01/06/18 0557 01/07/18 0431 01/08/18 0338 01/09/18 0613 01/10/18 0602  INR 2.02 1.83 1.33 1.00 1.02   Cardiac Enzymes: No results for input(s): CKTOTAL, CKMB, CKMBINDEX, TROPONINI in the last 168 hours. BNP (last 3 results) No results for input(s): PROBNP in the last 8760 hours. HbA1C: No results for input(s): HGBA1C in the last 72 hours. CBG: Recent Labs  Lab 01/09/18 1215 01/09/18 1721 01/09/18 2140 01/10/18 0752 01/10/18 1137  GLUCAP 161* 121* 160* 141* 150*   Lipid Profile: No results for input(s): CHOL, HDL, LDLCALC, TRIG, CHOLHDL, LDLDIRECT in the last 72 hours. Thyroid Function Tests: No results for input(s): TSH, T4TOTAL, FREET4, T3FREE, THYROIDAB in the last 72 hours. Anemia Panel: No results for input(s): VITAMINB12, FOLATE, FERRITIN, TIBC, IRON, RETICCTPCT in the last 72 hours. Urine analysis:    Component Value Date/Time   COLORURINE AMBER (A) 01/06/2018 1702   APPEARANCEUR HAZY (A) 01/06/2018 1702   LABSPEC 1.017 01/06/2018 1702   PHURINE 6.0 01/06/2018 1702   GLUCOSEU >=500 (A) 01/06/2018 1702   HGBUR SMALL (A) 01/06/2018 1702   BILIRUBINUR MODERATE (A) 01/06/2018 1702   KETONESUR NEGATIVE 01/06/2018 1702   PROTEINUR NEGATIVE 01/06/2018 1702   UROBILINOGEN 0.2 12/07/2014 1441   NITRITE NEGATIVE 01/06/2018 1702   LEUKOCYTESUR NEGATIVE 01/06/2018 1702   Sepsis Labs: _1 (procalcitonin:4,lacticidven:4)  ) Recent Results (from the past 240 hour(s))  Culture, blood (routine x 2)     Status: None   Collection Time: 01/02/18  7:24 AM  Result Value Ref Range Status   Specimen Description BLOOD LEFT HAND  Final   Special Requests IN PEDIATRIC BOTTLE Blood Culture adequate volume  Final   Culture   Final    NO GROWTH 5 DAYS Performed at [facility], [place] [address]  [place], [place] [address]    Report Status 01/07/2018 FINAL  Final  Culture, blood (routine x 2)     Status: None   Collection Time: 01/02/18  7:25 AM  Result Value Ref Range Status   Specimen Description BLOOD RIGHT ANTECUBITAL  Final   Special Requests   Final    BOTTLES DRAWN AEROBIC AND ANAEROBIC Blood Culture adequate volume   Culture   Final    NO GROWTH 5 DAYS Performed at [facility], [place] [address], [place], [place] [address]    Report Status 01/07/2018 FINAL  Final  Cytomegalovirus (CMV) Culture     Status: None  Collection Time: 01/02/18  8:50 AM  Result Value Ref Range Status   Cytomegalovirus (CMV) Culture Comment  Final    Comment: (NOTE) No Cytomegalovirus isolated. Performed At: [facility] [place], [place] [id number] Rush Farmer MD SK:[id number]    Source of Sample ESOPHAGUS  Final    Comment: BIOSPY  Respiratory Panel by PCR     Status: None   Collection Time: 01/02/18 10:42 AM  Result Value Ref Range Status   Adenovirus NOT DETECTED NOT DETECTED Final   Coronavirus 229E NOT DETECTED NOT DETECTED Final   Coronavirus HKU1 NOT DETECTED NOT DETECTED Final   Coronavirus NL63 NOT DETECTED NOT DETECTED Final   Coronavirus OC43 NOT DETECTED NOT DETECTED Final   Metapneumovirus NOT DETECTED NOT DETECTED Final   Rhinovirus / Enterovirus NOT DETECTED NOT DETECTED Final   Influenza A NOT DETECTED NOT DETECTED Final   Influenza B NOT DETECTED NOT DETECTED Final   Parainfluenza Virus 1 NOT DETECTED NOT DETECTED Final   Parainfluenza Virus 2 NOT DETECTED NOT DETECTED Final   Parainfluenza Virus 3 NOT DETECTED NOT DETECTED Final   Parainfluenza Virus 4 NOT DETECTED NOT DETECTED Final   Respiratory Syncytial Virus NOT DETECTED NOT DETECTED Final   Bordetella pertussis NOT DETECTED NOT DETECTED Final   Chlamydophila pneumoniae NOT DETECTED NOT DETECTED Final   Mycoplasma pneumoniae NOT DETECTED NOT DETECTED Final  Culture, Urine     Status:  Abnormal   Collection Time: 01/02/18 12:41 PM  Result Value Ref Range Status   Specimen Description URINE, CLEAN CATCH  Final   Special Requests NONE  Final   Culture (A)  Final    40,000 COLONIES/mL STAPHYLOCOCCUS SPECIES (COAGULASE NEGATIVE)   Report Status 01/04/2018 FINAL  Final   Organism ID, Bacteria STAPHYLOCOCCUS SPECIES (COAGULASE NEGATIVE) (A)  Final      Susceptibility   Staphylococcus species (coagulase negative) - MIC*    CIPROFLOXACIN <=0.5 SENSITIVE Sensitive     GENTAMICIN <=0.5 SENSITIVE Sensitive     NITROFURANTOIN <=16 SENSITIVE Sensitive     OXACILLIN >=4 RESISTANT Resistant     TETRACYCLINE >=16 RESISTANT Resistant     VANCOMYCIN 1 SENSITIVE Sensitive     TRIMETH/SULFA <=10 SENSITIVE Sensitive     CLINDAMYCIN RESISTANT Resistant     RIFAMPIN <=0.5 SENSITIVE Sensitive     Inducible Clindamycin POSITIVE Resistant     * 40,000 COLONIES/mL STAPHYLOCOCCUS SPECIES (COAGULASE NEGATIVE)  MRSA PCR Screening     Status: None   Collection Time: 01/06/18  6:59 PM  Result Value Ref Range Status   MRSA by PCR NEGATIVE NEGATIVE Final    Comment:        The GeneXpert MRSA Assay (FDA approved for NASAL specimens only), is one component of a comprehensive MRSA colonization surveillance program. It is not intended to diagnose MRSA infection nor to guide or monitor treatment for MRSA infections.   Aerobic/Anaerobic Culture (surgical/deep wound)     Status: None (Preliminary result)   Collection Time: 01/07/18  5:59 PM  Result Value Ref Range Status   Specimen Description LIVER BIOPSY  Final   Special Requests NONE  Final   Gram Stain   Final    MODERATE WBC PRESENT, PREDOMINANTLY MONONUCLEAR NO ORGANISMS SEEN    Culture   Final    NO GROWTH 3 DAYS NO ANAEROBES ISOLATED; CULTURE IN PROGRESS FOR 5 DAYS Performed at [facility], [address], [place], [place] [address]    Report Status PENDING  Incomplete  Studies: No results found.  Scheduled  Meds: . bictegravir-emtricitabine-tenofovir AF  1 tablet Oral Daily  . dexamethasone  40 mg Oral Daily  . feeding supplement  1 Container Oral TID BM  . folic acid  2 mg Oral Daily  . insulin aspart  0-9 Units Subcutaneous TID WC  . iopamidol  30 mL Oral Once  . multivitamin with minerals  1 tablet Oral Daily  . nystatin  5 mL Oral QID  . pantoprazole (PROTONIX) IV  40 mg Intravenous Q12H  . sucralfate  1 g Oral TID WC & HS    Continuous Infusions: . sodium chloride    . anidulafungin Stopped (01/09/18 1920)  . potassium chloride 10 mEq (01/10/18 1323)     LOS: 11 days     Desiree Hane, MD [facility] Pager [phone]  If 7PM-7AM, please contact night-coverage [email or web address] Password [email or web address] 01/10/2018, 1:52 PM

## 2018-01-11 LAB — CBC WITH DIFFERENTIAL/PLATELET
BASOS PCT: 0 %
Basophils Absolute: 0 10*3/uL (ref 0.0–0.1)
EOS ABS: 0 10*3/uL (ref 0.0–0.7)
EOS PCT: 0 %
HCT: 37.4 % (ref 36.0–46.0)
HEMOGLOBIN: 13.4 g/dL (ref 12.0–15.0)
LYMPHS PCT: 2 %
Lymphs Abs: 0.1 10*3/uL — ABNORMAL LOW (ref 0.7–4.0)
MCH: 28 pg (ref 26.0–34.0)
MCHC: 35.8 g/dL (ref 30.0–36.0)
MCV: 78.1 fL (ref 78.0–100.0)
Monocytes Absolute: 0.1 10*3/uL (ref 0.1–1.0)
Monocytes Relative: 2 %
NEUTROS PCT: 96 %
Neutro Abs: 2.7 10*3/uL (ref 1.7–7.7)
Platelets: 79 10*3/uL — ABNORMAL LOW (ref 150–400)
RBC: 4.79 MIL/uL (ref 3.87–5.11)
RDW: 23.1 % — ABNORMAL HIGH (ref 11.5–15.5)
WBC: 2.9 10*3/uL — AB (ref 4.0–10.5)

## 2018-01-11 LAB — BASIC METABOLIC PANEL
ANION GAP: 8 (ref 5–15)
BUN: 11 mg/dL (ref 6–20)
CHLORIDE: 104 mmol/L (ref 101–111)
CO2: 15 mmol/L — ABNORMAL LOW (ref 22–32)
Calcium: 7.6 mg/dL — ABNORMAL LOW (ref 8.9–10.3)
Creatinine, Ser: 0.31 mg/dL — ABNORMAL LOW (ref 0.44–1.00)
GFR calc Af Amer: >60 mL/min (ref >60)
GFR calc non Af Amer: >60 mL/min (ref >60)
Glucose, Bld: 257 mg/dL — ABNORMAL HIGH (ref 65–99)
POTASSIUM: 3.8 mmol/L (ref 3.5–5.1)
SODIUM: 127 mmol/L — AB (ref 135–145)

## 2018-01-11 LAB — HEPATIC FUNCTION PANEL
ALBUMIN: 1.8 g/dL — AB (ref 3.5–5.0)
ALT: 63 U/L — ABNORMAL HIGH (ref 14–54)
AST: 122 U/L — AB (ref 15–41)
Alkaline Phosphatase: 501 U/L — ABNORMAL HIGH (ref 38–126)
Bilirubin, Direct: 7.3 mg/dL — ABNORMAL HIGH (ref 0.1–0.5)
Indirect Bilirubin: 4.5 mg/dL — ABNORMAL HIGH (ref 0.3–0.9)
Total Bilirubin: 11.8 mg/dL — ABNORMAL HIGH (ref 0.3–1.2)
Total Protein: 5.1 g/dL — ABNORMAL LOW (ref 6.5–8.1)

## 2018-01-11 LAB — GLUCOSE, CAPILLARY
GLUCOSE-CAPILLARY: 178 mg/dL — AB (ref 65–99)
GLUCOSE-CAPILLARY: 193 mg/dL — AB (ref 65–99)
GLUCOSE-CAPILLARY: 252 mg/dL — AB (ref 65–99)
Glucose-Capillary: 194 mg/dL — ABNORMAL HIGH (ref 65–99)

## 2018-01-11 LAB — PROTIME-INR
INR: 0.99
PROTHROMBIN TIME: 13 s (ref 11.4–15.2)

## 2018-01-11 MED ORDER — PANTOPRAZOLE SODIUM 40 MG PO TBEC
40.0000 mg | DELAYED_RELEASE_TABLET | Freq: Two times a day (BID) | ORAL | Status: DC
  Administered 2018-01-11 – 2018-01-13 (×4): 40 mg via ORAL
  Filled 2018-01-11 (×5): qty 1

## 2018-01-11 MED ORDER — SODIUM CHLORIDE 0.9 % IV SOLN
INTRAVENOUS | Status: AC
  Administered 2018-01-11 – 2018-01-12 (×2): via INTRAVENOUS

## 2018-01-11 NOTE — Progress Notes (Signed)
PROGRESS NOTE  [name]:[id number] DOB: 07/09/1976 DOA: 12/29/2017 PCP: Nolene Ebbs, MD  HPI/Recap of past 24 hours:  [name] is a 42 y.o. 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 S/p liver biopsy on 01/07/18   Did well with diet overnight (regular diet close (.  Not endorsing any abdominal pain.  No nausea or vomiting.  Denies any bleeding.  Assessment/Plan: Principal Problem:   Pancreatitis Active Problems:   HIV disease ([place])   Bipolar disorder ([place])   Hypokalemia   Esophageal stricture   Dysphagia   Candidal esophagitis ([facility])   Severe protein-calorie malnutrition ([facility])   Abdominal pain   Lactic acidosis   Sinus bradycardia  Asymptomatic hyponatremia, new, stable Na currently 127, same as yesterday.  Expect improvement now that patient tolerating p.o.  We will continue to monitor.  Patient remains asymptomatic.  Did not improve with discontinuation of IV fluids.  Will start gentle hydration over the next 12-24 hours.  Will monitor on BMP  Hypokalemia, improving.   Status post IV potassium repletion on yesterday.  Potassium normalized today.  We will continue monitor on BMP  Chest pain, resolved.  Occurred once during hospitalization.  EKG at time of evaluation on 1/24 showed no acute ischemic changes, will continue to monitor.  Severe sinus bradycardia , new, resolved. Possibly related to antibiotics for MAC therapy (discontinued on 1/24 by ID), since not on Beta-blockers or other known meds.  We will continue to monitor.  Acute pancreatitis,  unclear etiology, improving.  Initial lipase greater than 1000 on admission.  MRCP showed no obstructive etiology.  Triglycerides of 400, typically expect it to be greater than 1000 to contribute significant pancreatitis so less likely etiology.  Possibly related to HIV pathology.  Additionally possible dapsone could contribute to pink otitis/cholestasis.  Tolerating regular diet.  We will follow-up lipase on 1/28.  Transaminitis, hyperbilirubinemia, elevated INR, concerning for liver failure, unclear etiology, stable  Status post liver biopsy on 01/07/18, follow histopathology.  Now off MAC therapy given above-mentioned sinus bradycardia.  As mentioned in previous progress notes prior medications have been discontinued to determine if associated with poor liver function, maybe DILI drug-induced liver injury)(.  Autoimmune workup to date has been negative.  Will need to follow up liver biopsy, hopeful for results on 1/28.    Acute on chronic anemia of chronic disease, stable. hgb stable at 11. Required blood transfusion on 1/23.    Continue to monitor counts. 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  Platelets of 79.continue Decadron.  will continue to monitor  Lactic acidosis, unclear etiology, resolved.  No ischemic bowel, no hypotension or hypoxia since admission, no DKA ( though briefly put on insulin gtt and transferred to stepdown unit because of concern).  Peak 7.4 improved to 2.4 considered D lactic acidosis in the setting of poor clearance due to poor liver function. Temporarily held biktarvy. PCCM  previously consulted.   HIV, poorly controlled (CD4 count 8).    Follow-up AFB blood culture as we await path results from liver biopsy.  Continuing biktarvy  Esophageal candidiasis, stable.  Continue Anidulafungin, PPI and [name].  CMV stain was  negative  Esophageal stricture. Dilation deferred in the setting of current thrombocytopenia.  Patient  tolerating regular diet, so GI will defer possible EGD until 29.  In addition to give [place] more time to ensure blood count is appropriate.  1/ Per Gi recs: anticipate EGD with dilation when  platelets consistently around 50,000(currently in 60s, so hopefully can occur soon). continue to monitor  Steroid-induced hyperglycemia, stable.  Correction mealtime insulin.  Continue to monitor blood glucose levels  Hypomagnesemia. Resolved   Code Status: Full code  Family Communication: Family updated at bedside  Disposition Plan: f/u liver biopsy, monitor sodium, monitor liver function labs and hgb, potential EGD?   Consultants: Infectious disease, PCCM, gastroenterology, Oncology Procedures:  Liver biopsy 01/07/18  EEG on 01/06/18 mild diffuse slowing of cerebral activity nonspecific state (depression.  Toxic, metabolic, degenerative etiologies) no epileptiform activity recorded.  Antimicrobials:  Anidulafungin 1/21-1/22  Azithromycin 1/22>>1/24  Ethambutol 1/23>>1/24  Cefepime 1/18  Dapsone 1/17- 1/19  Fluconazole 1/15-1/19  Vancomycin 1/18  Cultures: Blood cultures x2, negative up-to-date (01/02/18) Urine culture 01/02/18, staph, coagulase negative, 40,000 colonies Esophageal biopsy 01/02/18, CMV negative Acid fast 1/20 1/19 x 2 negative growth to date Acid fast and cultures pending from liver biopsy on 01/07/18  DVT prophylaxis: SCDs   Objective: Vitals:   01/10/18 2147 01/11/18 0600 01/11/18 0616 01/11/18 1519  BP: (!) 160/92  (!) 134/96 (!) 152/92  Pulse: (!) 57  79 62  Resp: _0 Temp: 97.9 F (36.6 C)  (!) 97.4 F (36.3 C) (!) 97.5 F (36.4 C)  TempSrc: Oral  Oral Oral  SpO2: 97%  99% 100%  Weight:  67.3 kg (148 lb 5.9 oz)    Height:        Intake/Output Summary (Last 24 hours) at 01/11/2018 1721 Last data filed at 01/11/2018 1500 Gross per 24 hour  Intake 720 ml  Output 500 ml  Net 220 ml   Filed Weights   01/06/18 0659 01/07/18 0401 01/11/18 0600   Weight: 55.2 kg (121 lb 11.1 oz) 57.2 kg (126 lb 1.7 oz) 67.3 kg (148 lb 5.9 oz)    Exam:  General: Sitting in bedside chair, pleasant in conversation Respiratory: Normal respiratory effort, no respiratory distress Abdomen: soft, non-distended, non-tender, no guarding, no rebound tenderness  Skin: No Rash Neurologic: Grossly no focal neuro deficit.Mental status AAOx3, Psychiatric: improved affect, normal mood  Data Reviewed: CBC: Recent Labs  Lab 01/07/18 2119 01/08/18 0338 01/09/18 0613 01/10/18 0602 01/11/18 0620  WBC 3.7* 3.1* 3.7* 3.3* 2.9*  NEUTROABS 3.3 2.9 3.4 2.9 2.7  HGB 8.6* 7.4* 11.3* 11.5* 13.4  HCT 24.7* 21.0* 31.6* 31.8* 37.4  MCV 77.7* 76.1* 77.3* 77.0* 78.1  PLT 41* 37* 46* 60* 79*   Basic Metabolic Panel: Recent Labs  Lab 01/06/18 0557  01/07/18 2338 01/08/18 0338 01/08/18 1828 01/09/18 0613 01/10/18 0602 01/11/18 0620  NA 132*   < > 135 136  --  135 127* 127*  K 3.4*   < > 3.2* 3.4*  --  3.1* 3.0* 3.8  CL 106   < > 113* 110  --  106 103 104  CO2 18*   < > 17* 19*  --  22 19* 15*  GLUCOSE 408*   < > 225* 158*  --  191* 133* 257*  BUN 12   < > 8 9  --  _1 CREATININE 0.36*   < > 0.41* <0.30*  --  <0.30* <0.30* 0.31*  CALCIUM 8.0*   < > 7.4* 7.7*  --  7.7* 7.0* 7.6*  MG 1.6*  --   --   --  2.4 1.9  --   --    < > = values in this interval not displayed.   GFR: Estimated Creatinine Clearance: 99.3 mL/min (A) (by C-G formula based on SCr of 0.31 mg/dL (L)). Liver Function Tests: Recent Labs  Lab 01/07/18 0431 01/08/18 0338 01/09/18 0613 01/10/18 0602 01/11/18 0620  AST 181* 110* 96* 100* 122*  ALT 48 40 42 46 63*  ALKPHOS 425* 414* 456* 437* 501*  BILITOT 12.8* 10.3* 11.6* 11.3* 11.8*  PROT 4.2* 4.8* 5.1* 4.5* 5.1*  ALBUMIN 1.3* 1.8* 1.7* 1.6* 1.8*   No results for input(s): LIPASE, AMYLASE in the last 168 hours. Recent Labs  Lab 01/05/18 0901  AMMONIA 43*   Coagulation Profile: Recent Labs  Lab 01/07/18 0431  01/08/18 0338 01/09/18 0613 01/10/18 0602 01/11/18 0620  INR 1.83 1.33 1.00 1.02 0.99   Cardiac Enzymes: No results for input(s): CKTOTAL, CKMB, CKMBINDEX, TROPONINI in the last 168 hours. BNP (last 3 results) No results for input(s): PROBNP in the last 8760 hours. HbA1C: No results for input(s): HGBA1C in the last 72 hours. CBG: Recent Labs  Lab 01/10/18 1137 01/10/18 1657 01/10/18 2145 01/11/18 0735 01/11/18 1124  GLUCAP 150* 110* 149* 178* 193*   Lipid Profile: No results for input(s): CHOL, HDL, LDLCALC, TRIG, CHOLHDL, LDLDIRECT in the last 72 hours. Thyroid Function Tests: No results for input(s): TSH, T4TOTAL, FREET4, T3FREE, THYROIDAB in the last 72 hours. Anemia Panel: No results for input(s): VITAMINB12, FOLATE, FERRITIN, TIBC, IRON, RETICCTPCT in the last 72 hours. Urine analysis:    Component Value Date/Time   COLORURINE AMBER (A) 01/06/2018 1702   APPEARANCEUR HAZY (A) 01/06/2018 1702   LABSPEC 1.017 01/06/2018 1702   PHURINE 6.0 01/06/2018 1702   GLUCOSEU >=500 (A) 01/06/2018 1702   HGBUR SMALL (A) 01/06/2018 1702   BILIRUBINUR MODERATE (A) 01/06/2018 1702   KETONESUR NEGATIVE 01/06/2018 1702   PROTEINUR NEGATIVE 01/06/2018 1702   UROBILINOGEN 0.2 12/07/2014 1441   NITRITE NEGATIVE 01/06/2018 1702   LEUKOCYTESUR NEGATIVE 01/06/2018 1702   Sepsis Labs: _0 (procalcitonin:4,lacticidven:4)  ) Recent Results (from the past 240 hour(s))  Culture, blood (routine x 2)     Status: None   Collection Time: 01/02/18  7:24 AM  Result Value Ref Range Status   Specimen Description BLOOD LEFT HAND  Final   Special Requests IN PEDIATRIC BOTTLE Blood Culture adequate volume  Final   Culture   Final    NO GROWTH 5 DAYS Performed at [facility], [place] [address], [place], [place] [address]    Report Status 01/07/2018 FINAL  Final  Culture, blood (routine x 2)     Status: None   Collection Time: 01/02/18  7:25 AM  Result Value Ref Range Status    Specimen Description BLOOD RIGHT ANTECUBITAL  Final   Special Requests   Final    BOTTLES DRAWN AEROBIC AND ANAEROBIC Blood Culture adequate volume   Culture   Final    NO GROWTH 5 DAYS Performed at [facility], [place] [address] [place], [place] [address]    Report Status 01/07/2018 FINAL  Final  Cytomegalovirus (CMV) Culture     Status: None   Collection Time: 01/02/18  8:50 AM  Result Value Ref Range Status   Cytomegalovirus (CMV) Culture Comment  Final    Comment: (NOTE) No Cytomegalovirus isolated.  Performed At: [facility] [place], [place] [id number] Rush Farmer MD WV:[id number]    Source of Sample ESOPHAGUS  Final    Comment: BIOSPY  Respiratory Panel by PCR     Status: None   Collection Time: 01/02/18 10:42 AM  Result Value Ref Range Status   Adenovirus NOT DETECTED NOT DETECTED Final   Coronavirus 229E NOT DETECTED NOT DETECTED Final   Coronavirus HKU1 NOT DETECTED NOT DETECTED Final   Coronavirus NL63 NOT DETECTED NOT DETECTED Final   Coronavirus OC43 NOT DETECTED NOT DETECTED Final   Metapneumovirus NOT DETECTED NOT DETECTED Final   Rhinovirus / Enterovirus NOT DETECTED NOT DETECTED Final   Influenza A NOT DETECTED NOT DETECTED Final   Influenza B NOT DETECTED NOT DETECTED Final   Parainfluenza Virus 1 NOT DETECTED NOT DETECTED Final   Parainfluenza Virus 2 NOT DETECTED NOT DETECTED Final   Parainfluenza Virus 3 NOT DETECTED NOT DETECTED Final   Parainfluenza Virus 4 NOT DETECTED NOT DETECTED Final   Respiratory Syncytial Virus NOT DETECTED NOT DETECTED Final   Bordetella pertussis NOT DETECTED NOT DETECTED Final   Chlamydophila pneumoniae NOT DETECTED NOT DETECTED Final   Mycoplasma pneumoniae NOT DETECTED NOT DETECTED Final  Culture, Urine     Status: Abnormal   Collection Time: 01/02/18 12:41 PM  Result Value Ref Range Status   Specimen Description URINE, CLEAN CATCH  Final   Special Requests NONE  Final   Culture (A)  Final     40,000 COLONIES/mL STAPHYLOCOCCUS SPECIES (COAGULASE NEGATIVE)   Report Status 01/04/2018 FINAL  Final   Organism ID, Bacteria STAPHYLOCOCCUS SPECIES (COAGULASE NEGATIVE) (A)  Final      Susceptibility   Staphylococcus species (coagulase negative) - MIC*    CIPROFLOXACIN <=0.5 SENSITIVE Sensitive     GENTAMICIN <=0.5 SENSITIVE Sensitive     NITROFURANTOIN <=16 SENSITIVE Sensitive     OXACILLIN >=4 RESISTANT Resistant     TETRACYCLINE >=16 RESISTANT Resistant     VANCOMYCIN 1 SENSITIVE Sensitive     TRIMETH/SULFA <=10 SENSITIVE Sensitive     CLINDAMYCIN RESISTANT Resistant     RIFAMPIN <=0.5 SENSITIVE Sensitive     Inducible Clindamycin POSITIVE Resistant     * 40,000 COLONIES/mL STAPHYLOCOCCUS SPECIES (COAGULASE NEGATIVE)  MRSA PCR Screening     Status: None   Collection Time: 01/06/18  6:59 PM  Result Value Ref Range Status   MRSA by PCR NEGATIVE NEGATIVE Final    Comment:        The GeneXpert MRSA Assay (FDA approved for NASAL specimens only), is one component of a comprehensive MRSA colonization surveillance program. It is not intended to diagnose MRSA infection nor to guide or monitor treatment for MRSA infections.   Aerobic/Anaerobic Culture (surgical/deep wound)     Status: None (Preliminary result)   Collection Time: 01/07/18  5:59 PM  Result Value Ref Range Status   Specimen Description LIVER BIOPSY  Final   Special Requests NONE  Final   Gram Stain   Final    MODERATE WBC PRESENT, PREDOMINANTLY MONONUCLEAR NO ORGANISMS SEEN    Culture   Final    NO GROWTH 4 DAYS NO ANAEROBES ISOLATED; CULTURE IN PROGRESS FOR 5 DAYS Performed at [facility], [address] [place], [place] [address]    Report Status PENDING  Incomplete      Studies: No results found.  Scheduled Meds: . bictegravir-emtricitabine-tenofovir AF  1 tablet Oral Daily  . dexamethasone  40 mg Oral Daily  .  feeding supplement  1 Container Oral TID BM  . folic acid  2 mg Oral Daily  .  insulin aspart  0-9 Units Subcutaneous TID WC  . iopamidol  30 mL Oral Once  . multivitamin with minerals  1 tablet Oral Daily  . nystatin  5 mL Oral QID  . pantoprazole  40 mg Oral BID  . sucralfate  1 g Oral TID WC & HS    Continuous Infusions: . sodium chloride    . anidulafungin 200 mg (01/11/18 1525)     LOS: 12 days     Desiree Hane, MD [facility] Pager [phone]  If 7PM-7AM, please contact night-coverage [email or web address] Password [facility] 01/11/2018, 5:21 PM

## 2018-01-11 NOTE — Progress Notes (Signed)
Distal esophageal stricture with esophagitis: Patient is doing very well consuming a chopped diet.    She has a history of recurrent esophageal stricturing and has undergone multiple dilatations by Dr. Cannon Kettle, so she is somewhat accustomed to eating despite having a narrow distal esophageal aperture.  Her platelet count continues to improve.  PLAN: Defer endoscopy until at least Tuesday--since patient is able to eat adequately, there is no rush to doing the procedure, but actually there is an advantage to waiting as long as the platelet count continues to climb..  I will let Dr. Michail Sermon decide how high he wants the platelet count to be before proceeding.  Elevated liver chemistries: Hopefully the biopsy results will be ready tomorrow.  Acute pancreatitis: Clinically resolved, have ordered lipase level for tomorrow morning.  Cleotis Nipper, M.D. Pager [phone] If no answer or after 5 PM call [phone]

## 2018-01-12 LAB — CBC WITH DIFFERENTIAL/PLATELET
BASOS PCT: 1 %
Basophils Absolute: 0 10*3/uL (ref 0.0–0.1)
EOS PCT: 0 %
Eosinophils Absolute: 0 10*3/uL (ref 0.0–0.7)
HEMATOCRIT: 38.4 % (ref 36.0–46.0)
HEMOGLOBIN: 13.7 g/dL (ref 12.0–15.0)
Lymphocytes Relative: 5 %
Lymphs Abs: 0.2 10*3/uL — ABNORMAL LOW (ref 0.7–4.0)
MCH: 28.5 pg (ref 26.0–34.0)
MCHC: 35.7 g/dL (ref 30.0–36.0)
MCV: 80 fL (ref 78.0–100.0)
MONOS PCT: 4 %
Monocytes Absolute: 0.2 10*3/uL (ref 0.1–1.0)
NEUTROS PCT: 90 %
Neutro Abs: 3.9 10*3/uL (ref 1.7–7.7)
Platelets: 71 10*3/uL — ABNORMAL LOW (ref 150–400)
RBC: 4.8 MIL/uL (ref 3.87–5.11)
RDW: 23.8 % — ABNORMAL HIGH (ref 11.5–15.5)
WBC: 4.3 10*3/uL (ref 4.0–10.5)

## 2018-01-12 LAB — BASIC METABOLIC PANEL
ANION GAP: 6 (ref 5–15)
BUN: 11 mg/dL (ref 6–20)
CHLORIDE: 107 mmol/L (ref 101–111)
CO2: 19 mmol/L — AB (ref 22–32)
Calcium: 7.9 mg/dL — ABNORMAL LOW (ref 8.9–10.3)
Creatinine, Ser: <0.3 mg/dL — ABNORMAL LOW (ref 0.44–1.00)
Glucose, Bld: 154 mg/dL — ABNORMAL HIGH (ref 65–99)
POTASSIUM: 3.8 mmol/L (ref 3.5–5.1)
Sodium: 132 mmol/L — ABNORMAL LOW (ref 135–145)

## 2018-01-12 LAB — HEPATIC FUNCTION PANEL
ALBUMIN: 1.9 g/dL — AB (ref 3.5–5.0)
ALT: 76 U/L — ABNORMAL HIGH (ref 14–54)
AST: 125 U/L — AB (ref 15–41)
Alkaline Phosphatase: 537 U/L — ABNORMAL HIGH (ref 38–126)
Bilirubin, Direct: 6.8 mg/dL — ABNORMAL HIGH (ref 0.1–0.5)
Indirect Bilirubin: 4.6 mg/dL — ABNORMAL HIGH (ref 0.3–0.9)
TOTAL PROTEIN: 4.8 g/dL — AB (ref 6.5–8.1)
Total Bilirubin: 11.4 mg/dL — ABNORMAL HIGH (ref 0.3–1.2)

## 2018-01-12 LAB — GLUCOSE, CAPILLARY
GLUCOSE-CAPILLARY: 119 mg/dL — AB (ref 65–99)
Glucose-Capillary: 115 mg/dL — ABNORMAL HIGH (ref 65–99)
Glucose-Capillary: 110 mg/dL — ABNORMAL HIGH (ref 65–99)
Glucose-Capillary: 149 mg/dL — ABNORMAL HIGH (ref 65–99)

## 2018-01-12 LAB — PARVOVIRUS B19 ANTIBODY, IGG AND IGM
Parovirus B19 IgG Abs: 0.4 index (ref 0.0–0.8)
Parovirus B19 IgM Abs: 0.2 index (ref 0.0–0.8)

## 2018-01-12 LAB — ACID FAST SMEAR (AFB, MYCOBACTERIA): Acid Fast Smear: NEGATIVE

## 2018-01-12 LAB — PROTIME-INR
INR: 0.94
Prothrombin Time: 12.4 seconds (ref 11.4–15.2)

## 2018-01-12 LAB — LIPASE, BLOOD: LIPASE: 42 U/L (ref 11–51)

## 2018-01-12 NOTE — Progress Notes (Signed)
CRITICAL VALUE ALERT  Critical Value:  Liver biopsy growing rear anaerobes   Date & Time Notied:  01/12/18  Provider Notified: Oretha Milch   Orders Received/Actions taken: no new orders

## 2018-01-12 NOTE — Progress Notes (Addendum)
[facility] Gastroenterology Progress Note  [name] 42 y.o. [birth year]   Subjective: Tolerating small amounts of regular diet with burning at times with swallowing. Denies abdominal pain.  Objective: Vital signs: Vitals:   01/11/18 2105 01/12/18 0631  BP: (!) 152/82 (!) 142/88  Pulse: (!) 56 66  Resp: 17 17  Temp: 97.8 F (36.6 [name]) (!) 97.4 F (36.3 [name])  SpO2: 100% 100%    Physical Exam: Gen: alert, no acute distress, thin  HEENT: anicteric sclera CV: RRR Chest: CTA B Abd: epigastric tenderness with guarding, soft, nondistended, +BS  Lab Results: Recent Labs    01/11/18 0620 01/12/18 0601  NA 127* 132*  K 3.8 3.8  CL 104 107  CO2 15* 19*  GLUCOSE 257* 154*  BUN 11 11  CREATININE 0.31* <0.30*  CALCIUM 7.6* 7.9*   Recent Labs    01/11/18 0620 01/12/18 0601  AST 122* 125*  ALT 63* 76*  ALKPHOS 501* 537*  BILITOT 11.8* 11.4*  PROT 5.1* 4.8*  ALBUMIN 1.8* 1.9*   Recent Labs    01/11/18 0620 01/12/18 0601  WBC 2.9* 4.3  NEUTROABS 2.7 3.9  HGB 13.4 13.7  HCT 37.4 38.4  MCV 78.1 80.0  PLT 79* 71*      Assessment/Plan: High-grade esophageal stricture with Candida esophagitis in the setting of HIV. Needs repeat EGD with dilation prior to discharge and will plan to do EGD dilation with fluoro on 01/14/18 if platelets are stable. Patient agreeable to proceed. D/[name] [name] and Nystatin. Continue Eraxis. Clear liquid diet tomorrow afternoon and NPO p MN Tuesday night.   [name]. 01/12/2018, 1:18 PM  Pager [phone]  AFTER 5 PM or on weekends please call [phone] ID: [name], female   DOB: 1976-06-20, 42 y.o.   MRN: [id number]

## 2018-01-12 NOTE — Progress Notes (Signed)
Nutrition Follow-up  DOCUMENTATION CODES:   Severe malnutrition in context of chronic illness, Underweight  INTERVENTION:   Boost Breeze po TID, each supplement provides 250 kcal and 9 grams of protein  NUTRITION DIAGNOSIS:   Severe Malnutrition related to chronic illness, catabolic illness(HIV/AIDS) as evidenced by severe muscle depletion, severe fat depletion.  Ongoing  GOAL:   Patient will meet greater than or equal to 90% of their needs  Not Meeting  MONITOR:   PO intake, Supplement acceptance, Weight trends, Labs  REASON FOR ASSESSMENT:   Consult Assessment of nutrition requirement/status  ASSESSMENT:   42 y.o.female with medical history significant of HIV AIDS, Candida esophagitis, GERD, bipolar disorder, asthma, who comes in for 2 weeks of progressive worsening epigastric abdominal pain. Patient deteriorating for months, losing a significant amount of weight. She reports she is compliant with her HIV medications. She reports that she has been having dysphagia for months. She was given oral Diflucan however she cannot swallow the pills. She called ID office for liquid format which looks like was ordered by ID but patient never received this. She has been able to tolerate pure her food and able to hold that down. She cannot eat solids without meat getting stuck. She does frequently vomit depending on what she eats. No diarrhea. No cough. Her abdominal pain has been getting worse and is worse after eating. Sometimes she goes days without eating. Patient found to have pancreatitis with a lipase level over 1000   1/18- EGD- severe esophagitis and esophogeal stenosis requiring dilation 1/23- liver biopsy  Pt lethargic upon visit. States her appetite is off and on. Denies issues with swallowing this morning. Meal completions charted 10-50% for the last eight meals. Family is bringing outside food. Pt has cracker barrel last night. Ate 50% of her breakfast this morning which  contained scrambled eggs sausage and french toast (400 calories and 12 g protein). Pt does not drink Boost breeze consistently but requests they still be sent. Weight noted to trending up 29 lkb since last RD visit 1/21. Weight has steadily increased throughout this admission. Will continue to monitor weight trends.   Medications reviewed and include: folic acid, MVI with minerals, SSI, carafate Labs reviewed: Na 132 (L) ALP 537 (H) AST 125 (L) ALT 76 (L)   Diet Order:  Fall precautions Diet regular Room service appropriate? Yes; Fluid consistency: Thin  EDUCATION NEEDS:   No education needs have been identified at this time  Skin:  Skin Assessment: Reviewed RN Assessment  Last BM:  01/12/18  Height:   Ht Readings from Last 1 Encounters:  12/29/17 6\' 1"  (1.854 m)    Weight:   Wt Readings from Last 1 Encounters:  01/12/18 150 lb 5.7 oz (68.2 kg)    Ideal Body Weight:  75 kg  BMI:  Body mass index is 19.84 kg/m.  Estimated Nutritional Needs:   Kcal:  1865-2100 (40-45 kcal/kg)  Protein:  70-80 grams (1.5-1.7 grams/kg)  Fluid:  >/= 2 L/day    Mariana Single RD, LDN Clinical Nutrition Pager # - [phone]

## 2018-01-12 NOTE — Progress Notes (Addendum)
PROGRESS NOTE  [name]:[id number] DOB: January 17, 1976 DOA: 12/29/2017 PCP: Nolene Ebbs, MD  HPI/Recap of past 24 hours:  [name] is a 42 y.o. 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 S/p liver biopsy on 01/07/18   Continues to do well with diet, does report more frequent bowel movements but still well formed.  Denies any fevers or chills.  Denies any bleeding.  Denies any abdominal pain.  No nausea or vomiting.  Assessment/Plan: Principal Problem:   Pancreatitis Active Problems:   HIV disease ([place])   Bipolar disorder ([place])   Hypokalemia   Esophageal stricture   Dysphagia   Candidal esophagitis (HCC)   Severe protein-calorie malnutrition (HCC)   Abdominal pain   Lactic acidosis   Sinus bradycardia  Asymptomatic hyponatremia, resolved  Improved with fluid resuscitation with IV fluids and increase p.o. intake.  Expect to remain stable given patient now maintaining adequate oral intake.  We will continue to monitor on BMP  Hypokalemia, resolved.   Not requiring potassium repletion.  No longer having any nausea/emesis.  Continue to monitor BMP  Chest pain, resolved.  Occurred once during hospitalization.  EKG at time of evaluation on 1/24 showed no acute ischemic changes, will continue to monitor.  Severe sinus bradycardia , new, improved/stable possibly related to antibiotics for MAC therapy (discontinued on 1/24 by ID), since not on Beta-blockers or other known meds.  We will continue to monitor.  Acute pancreatitis, unclear etiology, resolved.  Initial  lipase greater than 1000 on admission now down trended to 42.  MRCP showed no obstructive etiology.  Triglycerides of 400, Possibly related to HIV pathology.  Additionally possible dapsone could contribute to pink otitis/cholestasis.  Tolerating regular diet.    Transaminitis(resolved), hyperbilirubinemia, elevated INR(resolved), concerning for liver failure, unclear etiology, stable  Status post liver biopsy on 01/07/18,  Total bili stable 11.4, INR,AST and ALT resolved.  Now off MAC therapy given above-mentioned sinus bradycardia.  As mentioned in previous progress notes prior medications have been discontinued to determine if associated with poor liver function, maybe DILI drug-induced liver injury)(.  Autoimmune workup to date has been negative ( anti-smooth muscle, mitochondrial antibody, ceruloplasmin all negative).  Addendum: Spoke with Dr. 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 F/u ANA. 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 hgb stable at 13.7 x 48 hours. Required blood transfusion on 1/23. 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  Hematology previously evaluated (consulted on 1/20)Platelets have continued to improve with empiric use of steroids. S/p Nplate on 5/63 Her oncologist Dr. Marin Olp also followed throughout her admission and believe her hematologic issues are related to poorly controlled HIV. Platelets of 71.continue Decadron.  will continue to monitor  Lactic acidosis, unclear etiology, resolved.  No ischemic bowel, no hypotension or hypoxia  since admission, no DKA ( though briefly put on  insulin gtt and transferred to stepdown unit because of concern).  Peak 7.4 improved to 2.4 considered D lactic acidosis in the setting of poor clearance due to poor liver function. Temporarily held biktarvy. PCCM  previously consulted.   HIV, poorly controlled (CD4 count 8).    Follow-up AFB blood culture as we await path results from liver biopsy.  Continuing biktarvy. ID following  Esophageal candidiasis, stable.  Continue Anidulafungin, PPI and [name].  CMV stain was negative. ID following. Depending on duration of IV antifungal or ability to switch to oral given patient tolerating oral diet now for 48 hours I anticipate patient is getting closer to being able to go home  Esophageal stricture. Dilation deferred in the setting of current thrombocytopenia.  Patient tolerating regular diet, so GI will defer possible EGD until 1/29.  In addition to give [place] more time to ensure blood count is appropriate and decreased risk for bleeding. Await GI evaluation  Steroid-induced hyperglycemia, stable.  Correction mealtime insulin.  Continue to monitor blood glucose levels  Hypomagnesemia. Resolved   Code Status: Full code  Family Communication: No family at bedside  Disposition Plan: f/u liver biopsy, monitor sodium, , potential EGD?, monitor total bilirubin   Consultants: Infectious disease, PCCM, gastroenterology, Oncology  Procedures:  Liver biopsy 01/07/18  EEG on 01/06/18 mild diffuse slowing of cerebral activity nonspecific state (depression.  Toxic, metabolic, degenerative etiologies) no epileptiform activity recorded.  Antimicrobials:  Anidulafungin 1/21-1/22  Azithromycin 1/22>>1/24  Ethambutol 1/23>>1/24  Cefepime 1/18  Dapsone 1/17- 1/19  Fluconazole 1/15-1/19  Vancomycin 1/18  Cultures: Blood cultures x2, negative up-to-date (01/02/18) Urine culture 01/02/18, staph, coagulase negative, 40,000 colonies Esophageal biopsy  01/02/18, CMV negative Acid fast 1/20 1/19 x 2 negative growth to date Acid fast and cultures pending from liver biopsy on 01/07/18  DVT prophylaxis: SCDs   Objective: Vitals:   01/11/18 0616 01/11/18 1519 01/11/18 2105 01/12/18 0631  BP: (!) 134/96 (!) 152/92 (!) 152/82 (!) 142/88  Pulse: 79 62 (!) 56 66  Resp: _0 Temp: (!) 97.4 F (36.3 C) (!) 97.5 F (36.4 C) 97.8 F (36.6 C) (!) 97.4 F (36.3 C)  TempSrc: Oral Oral Oral Oral  SpO2: 99% 100% 100% 100%  Weight:    68.2 kg (150 lb 5.7 oz)  Height:        Intake/Output Summary (Last 24 hours) at 01/12/2018 1238 Last data filed at 01/12/2018 0900 Gross per 24 hour  Intake 720 ml  Output -  Net 720 ml   Filed Weights   01/07/18 0401 01/11/18 0600 01/12/18 0631  Weight: 57.2 kg (126 lb 1.7 oz) 67.3 kg (148 lb 5.9 oz) 68.2 kg (150 lb 5.7 oz)    Exam:  General: Sitting in bed, pleasant in conversation Respiratory: Normal respiratory effort, no respiratory distress Abdomen: soft, non-distended, non-tender, no guarding, no rebound tenderness  Skin: No Rash Neurologic: Grossly no focal neuro deficit.Mental status AAOx3, Psychiatric: improved affect, normal mood  Data Reviewed: CBC: Recent Labs  Lab 01/08/18 0338 01/09/18 0613 01/10/18 0602 01/11/18 0620 01/12/18 0601  WBC 3.1* 3.7* 3.3* 2.9* 4.3  NEUTROABS 2.9 3.4 2.9 2.7 3.9  HGB 7.4* 11.3* 11.5* 13.4 13.7  HCT 21.0* 31.6* 31.8* 37.4 38.4  MCV 76.1* 77.3* 77.0* 78.1 80.0  PLT 37* 46* 60* 79* 71*   Basic Metabolic Panel: Recent Labs  Lab 01/06/18 0557  01/08/18 0338 01/08/18 1828 01/09/18 9381 01/10/18 0602 01/11/18 0620 01/12/18 0601  NA 132*   < >  136  --  135 127* 127* 132*  K 3.4*   < > 3.4*  --  3.1* 3.0* 3.8 3.8  CL 106   < > 110  --  106 103 104 107  CO2 18*   < > 19*  --  22 19* 15* 19*  GLUCOSE 408*   < > 158*  --  191* 133* 257* 154*  BUN 12   < > 9  --  _0 CREATININE 0.36*   < > <0.30*  --  <0.30* <0.30* 0.31* <0.30*    CALCIUM 8.0*   < > 7.7*  --  7.7* 7.0* 7.6* 7.9*  MG 1.6*  --   --  2.4 1.9  --   --   --    < > = values in this interval not displayed.   GFR: CrCl cannot be calculated (This lab value cannot be used to calculate CrCl because it is not a number: <0.30). Liver Function Tests: Recent Labs  Lab 01/08/18 0338 01/09/18 0613 01/10/18 0602 01/11/18 0620 01/12/18 0601  AST 110* 96* 100* 122* 125*  ALT 40 42 46 63* 76*  ALKPHOS 414* 456* 437* 501* 537*  BILITOT 10.3* 11.6* 11.3* 11.8* 11.4*  PROT 4.8* 5.1* 4.5* 5.1* 4.8*  ALBUMIN 1.8* 1.7* 1.6* 1.8* 1.9*   Recent Labs  Lab 01/12/18 0601  LIPASE 42   No results for input(s): AMMONIA in the last 168 hours. Coagulation Profile: Recent Labs  Lab 01/08/18 0338 01/09/18 4650 01/10/18 0602 01/11/18 0620 01/12/18 0601  INR 1.33 1.00 1.02 0.99 0.94   Cardiac Enzymes: No results for input(s): CKTOTAL, CKMB, CKMBINDEX, TROPONINI in the last 168 hours. BNP (last 3 results) No results for input(s): PROBNP in the last 8760 hours. HbA1C: No results for input(s): HGBA1C in the last 72 hours. CBG: Recent Labs  Lab 01/11/18 1124 01/11/18 1744 01/11/18 2108 01/12/18 0753 01/12/18 1211  GLUCAP 193* 194* 252* 119* 115*   Lipid Profile: No results for input(s): CHOL, HDL, LDLCALC, TRIG, CHOLHDL, LDLDIRECT in the last 72 hours. Thyroid Function Tests: No results for input(s): TSH, T4TOTAL, FREET4, T3FREE, THYROIDAB in the last 72 hours. Anemia Panel: No results for input(s): VITAMINB12, FOLATE, FERRITIN, TIBC, IRON, RETICCTPCT in the last 72 hours. Urine analysis:    Component Value Date/Time   COLORURINE AMBER (A) 01/06/2018 1702   APPEARANCEUR HAZY (A) 01/06/2018 1702   LABSPEC 1.017 01/06/2018 1702   PHURINE 6.0 01/06/2018 1702   GLUCOSEU >=500 (A) 01/06/2018 1702   HGBUR SMALL (A) 01/06/2018 1702   BILIRUBINUR MODERATE (A) 01/06/2018 1702   KETONESUR NEGATIVE 01/06/2018 1702   PROTEINUR NEGATIVE 01/06/2018 1702    UROBILINOGEN 0.2 12/07/2014 1441   NITRITE NEGATIVE 01/06/2018 1702   LEUKOCYTESUR NEGATIVE 01/06/2018 1702   Sepsis Labs: _1 (procalcitonin:4,lacticidven:4)  ) Recent Results (from the past 240 hour(s))  Culture, Urine     Status: Abnormal   Collection Time: 01/02/18 12:41 PM  Result Value Ref Range Status   Specimen Description URINE, CLEAN CATCH  Final   Special Requests NONE  Final   Culture (A)  Final    40,000 COLONIES/mL STAPHYLOCOCCUS SPECIES (COAGULASE NEGATIVE)   Report Status 01/04/2018 FINAL  Final   Organism ID, Bacteria STAPHYLOCOCCUS SPECIES (COAGULASE NEGATIVE) (A)  Final      Susceptibility   Staphylococcus species (coagulase negative) - MIC*    CIPROFLOXACIN <=0.5 SENSITIVE Sensitive     GENTAMICIN <=0.5 SENSITIVE Sensitive     NITROFURANTOIN <=16 SENSITIVE  Sensitive     OXACILLIN >=4 RESISTANT Resistant     TETRACYCLINE >=16 RESISTANT Resistant     VANCOMYCIN 1 SENSITIVE Sensitive     TRIMETH/SULFA <=10 SENSITIVE Sensitive     CLINDAMYCIN RESISTANT Resistant     RIFAMPIN <=0.5 SENSITIVE Sensitive     Inducible Clindamycin POSITIVE Resistant     * 40,000 COLONIES/mL STAPHYLOCOCCUS SPECIES (COAGULASE NEGATIVE)  MRSA PCR Screening     Status: None   Collection Time: 01/06/18  6:59 PM  Result Value Ref Range Status   MRSA by PCR NEGATIVE NEGATIVE Final    Comment:        The GeneXpert MRSA Assay (FDA approved for NASAL specimens only), is one component of a comprehensive MRSA colonization surveillance program. It is not intended to diagnose MRSA infection nor to guide or monitor treatment for MRSA infections.   Aerobic/Anaerobic Culture (surgical/deep wound)     Status: None (Preliminary result)   Collection Time: 01/07/18  5:59 PM  Result Value Ref Range Status   Specimen Description LIVER BIOPSY  Final   Special Requests NONE  Final   Gram Stain   Final    MODERATE WBC PRESENT, PREDOMINANTLY MONONUCLEAR NO ORGANISMS SEEN    Culture    Final    NO GROWTH 4 DAYS NO ANAEROBES ISOLATED; CULTURE IN PROGRESS FOR 5 DAYS Performed at [facility], [address], [place], [place] [address]    Report Status PENDING  Incomplete      Studies: No results found.  Scheduled Meds: . bictegravir-emtricitabine-tenofovir AF  1 tablet Oral Daily  . dexamethasone  40 mg Oral Daily  . feeding supplement  1 Container Oral TID BM  . folic acid  2 mg Oral Daily  . insulin aspart  0-9 Units Subcutaneous TID WC  . iopamidol  30 mL Oral Once  . multivitamin with minerals  1 tablet Oral Daily  . nystatin  5 mL Oral QID  . pantoprazole  40 mg Oral BID  . sucralfate  1 g Oral TID WC & HS    Continuous Infusions: . sodium chloride    . sodium chloride 100 mL/hr at 01/12/18 0816  . anidulafungin Stopped (01/11/18 1846)     LOS: 13 days     Desiree Hane, MD [facility] Pager [phone]  If 7PM-7AM, please contact night-coverage [email or web address] Password [facility] 01/12/2018, 12:38 PM

## 2018-01-12 NOTE — Progress Notes (Addendum)
[facility] for Infectious Disease    Date of Admission:  12/29/2017   Total days of antibiotics 14        Day 7 anidulafungin           ID: [name] is a 42 y.o. female with advanced hiv disease, eso candidiasis and eso stricture, admitted for pancreatitis but also found to have cholangitis/hepatitis concerning for diffuse liver injury/failure, also course complicated ITP, lactic acidosis and sinus bradycardia Principal Problem:   Pancreatitis Active Problems:   HIV disease ([place])   Bipolar disorder (H[facility])   Hypokalemia   Esophageal stricture   Dysphagia   Candidal esophagitis ([facility])   Severe protein-calorie malnutrition ([facility])   Abdominal pain   Lactic acidosis   Sinus bradycardia    Subjective: Afebrile, appears to have less bradycardia. Eating ok without significant pain  Medications:  . bictegravir-emtricitabine-tenofovir AF  1 tablet Oral Daily  . dexamethasone  40 mg Oral Daily  . feeding supplement  1 Container Oral TID BM  . folic acid  2 mg Oral Daily  . insulin aspart  0-9 Units Subcutaneous TID WC  . iopamidol  30 mL Oral Once  . multivitamin with minerals  1 tablet Oral Daily  . pantoprazole  40 mg Oral BID    Objective: Vital signs in last 24 hours: Temp:  [97.4 F (36.3 C)-97.8 F (36.6 C)] 97.4 F (36.3 C) (01/28 1424) Pulse Rate:  [56-66] 56 (01/28 1424) Resp:  [17-18] 18 (01/28 1424) BP: (142-165)/(82-89) 165/89 (01/28 1424) SpO2:  [100 %] 100 % (01/28 1424) Weight:  [150 lb 5.7 oz (68.2 kg)] 150 lb 5.7 oz (68.2 kg) (01/28 0631) Physical Exam  Constitutional:  oriented to person, place, and time. appears chronically ill, malnourished No distress.  HENT: [place]/AT, PERRLA, no scleral icterus Mouth/Throat: Oropharynx is clear and moist. No oropharyngeal exudate.  Cardiovascular: Normal rate, regular rhythm and normal heart sounds. Exam reveals no gallop and no friction rub.  No murmur heard.  Chest wall = rib markings Pulmonary/Chest:  Effort normal and breath sounds normal. No respiratory distress.  has no wheezes.  Neck = supple, no nuchal rigidity Abdominal: Soft. Bowel sounds are normal.  mild distension. mild tenderness.  Lymphadenopathy: no cervical adenopathy. No axillary adenopathy Neurological: alert and oriented to person, place, and time.  Skin: Skin is warm and dry. No rash noted. No erythema.  Psychiatric: a normal mood and affect.  behavior is normal.    Lab Results Recent Labs    01/11/18 0620 01/12/18 0601  WBC 2.9* 4.3  HGB 13.4 13.7  HCT 37.4 38.4  NA 127* 132*  K 3.8 3.8  CL 104 107  CO2 15* 19*  BUN 11 11  CREATININE 0.31* <0.30*   Liver Panel Recent Labs    01/11/18 0620 01/12/18 0601  PROT 5.1* 4.8*  ALBUMIN 1.8* 1.9*  AST 122* 125*  ALT 63* 76*  ALKPHOS 501* 537*  BILITOT 11.8* 11.4*  BILIDIR 7.3* 6.8*  IBILI 4.5* 4.6*    Microbiology: Liver biopsy -some growth on anaerobic culture Studies/Results: No results found.   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 I mentioned that it improved slightly with steroids. We are checking auto-immune work up. I have added EBV VL as well as HHV-6 VL as possible causes (her CMV VL<200, not really significant). He is  also doing viral stains on the biopsy. If stains are negative, and auto-immune is negative then will look at her HIV regimen as possibel cause. This is a rare cause of hepatitis in adults, and not really reported with his regimen, again it would be case-reportable. I would like to keep hiv regimen on if at all possible since her overall immune status is dependent on having her HIV VL being undetectable with treatment  Currently tbili 11 (it has not improved passed this point).  I suspect that steroids that was started for ITP helped this process but she  is on very high doses currently had tbili has stalled at 11.  If work up is negative, we will consider changing her regimen which would increase her pill burden unfortunately and impact her adherence  Anaerobic growth on liver bx culture -will call micro. Do not start empiric abtx at this time  eso candidiasis = continue on anidulafungin for now  eso stricture = platelet counts are improving. Planning for eso dilatation this week by dr schooler  oi proph = still hold on dapsone and azithromycin for now. Patient's afb blood cx at day 5 is no growth to date  Bradycardia = unclear if it was drug induced vs. Other features. Will hold offf on azithromycin unless needed  Malnutrition = anticipate she can be liberated to normal diet once she gets eso dilatation  hiv = continue on biktarvy for now.  Anticipate to keep her on a integrase inhibitor but change her off of TAF/emtricitibine  [facility] for Infectious Diseases Cell: [phone] Pager: [phone]  01/12/2018, 4:40 PM

## 2018-01-13 DIAGNOSIS — K8501 Idiopathic acute pancreatitis with uninfected necrosis: Secondary | ICD-10-CM

## 2018-01-13 LAB — CBC WITH DIFFERENTIAL/PLATELET
BASOS PCT: 0 %
Basophils Absolute: 0 10*3/uL (ref 0.0–0.1)
EOS PCT: 0 %
Eosinophils Absolute: 0 10*3/uL (ref 0.0–0.7)
HEMATOCRIT: 32.9 % — AB (ref 36.0–46.0)
HEMOGLOBIN: 11.8 g/dL — AB (ref 12.0–15.0)
Lymphocytes Relative: 1 %
Lymphs Abs: 0 10*3/uL — ABNORMAL LOW (ref 0.7–4.0)
MCH: 28.6 pg (ref 26.0–34.0)
MCHC: 35.9 g/dL (ref 30.0–36.0)
MCV: 79.7 fL (ref 78.0–100.0)
MONOS PCT: 3 %
Monocytes Absolute: 0.1 10*3/uL (ref 0.1–1.0)
NEUTROS ABS: 2.8 10*3/uL (ref 1.7–7.7)
Neutrophils Relative %: 96 %
Platelets: 57 10*3/uL — ABNORMAL LOW (ref 150–400)
RBC: 4.13 MIL/uL (ref 3.87–5.11)
RDW: 24.2 % — ABNORMAL HIGH (ref 11.5–15.5)
WBC: 2.9 10*3/uL — ABNORMAL LOW (ref 4.0–10.5)
nRBC: 1 /100 WBC — ABNORMAL HIGH

## 2018-01-13 LAB — MAGNESIUM: Magnesium: 1.4 mg/dL — ABNORMAL LOW (ref 1.7–2.4)

## 2018-01-13 LAB — BASIC METABOLIC PANEL
Anion gap: 7 (ref 5–15)
BUN: 10 mg/dL (ref 6–20)
CHLORIDE: 103 mmol/L (ref 101–111)
CO2: 18 mmol/L — AB (ref 22–32)
CREATININE: 0.32 mg/dL — AB (ref 0.44–1.00)
Calcium: 7.5 mg/dL — ABNORMAL LOW (ref 8.9–10.3)
GFR calc non Af Amer: >60 mL/min (ref >60)
Glucose, Bld: 207 mg/dL — ABNORMAL HIGH (ref 65–99)
POTASSIUM: 3.2 mmol/L — AB (ref 3.5–5.1)
Sodium: 128 mmol/L — ABNORMAL LOW (ref 135–145)

## 2018-01-13 LAB — PROTIME-INR
INR: 0.91
PROTHROMBIN TIME: 12.2 s (ref 11.4–15.2)

## 2018-01-13 LAB — GLUCOSE, CAPILLARY
GLUCOSE-CAPILLARY: 130 mg/dL — AB (ref 65–99)
Glucose-Capillary: 168 mg/dL — ABNORMAL HIGH (ref 65–99)
Glucose-Capillary: 199 mg/dL — ABNORMAL HIGH (ref 65–99)
Glucose-Capillary: 148 mg/dL — ABNORMAL HIGH (ref 65–99)

## 2018-01-13 LAB — HEPATIC FUNCTION PANEL
ALBUMIN: 1.8 g/dL — AB (ref 3.5–5.0)
ALK PHOS: 465 U/L — AB (ref 38–126)
ALT: 79 U/L — AB (ref 14–54)
AST: 114 U/L — AB (ref 15–41)
BILIRUBIN TOTAL: 10.9 mg/dL — AB (ref 0.3–1.2)
Bilirubin, Direct: 6.8 mg/dL — ABNORMAL HIGH (ref 0.1–0.5)
Indirect Bilirubin: 4.1 mg/dL — ABNORMAL HIGH (ref 0.3–0.9)
Total Protein: 4.9 g/dL — ABNORMAL LOW (ref 6.5–8.1)

## 2018-01-13 NOTE — Progress Notes (Signed)
Date: January 13, 2018 Velva Harman, BSN, [place], [place] Chart and notes review for patient progress and needs. Will follow for case management and discharge needs. No cm or discharge needs present at time of this review. Next review date: [id number]

## 2018-01-13 NOTE — Progress Notes (Signed)
PROGRESS NOTE  [name] BLT:[id number] DOB: [birth year] DOA: 12/29/2017 PCP: Nolene Ebbs, MD  HPI/Recap of past 24 hours:  [name] is a 42 y.o. 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 S/p liver biopsy on 01/07/18. Spoke with Dr. 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   Patient continues to tolerate oral diet, reports only mild abdominal pain.  No nausea or vomiting.    Assessment/Plan: Principal Problem:   Pancreatitis Active Problems:   HIV disease ([place])   Bipolar disorder ([place])   Hypokalemia   Esophageal stricture   Dysphagia   Candidal esophagitis ([facility])   Severe protein-calorie malnutrition ([facility])   Abdominal pain   Lactic acidosis   Sinus bradycardia  Asymptomatic hyponatremia,intermittent Improved with fluid resuscitation with IV fluids and increased p.o. intake.  Expect to remain stable given patient now maintaining adequate oral intake. Range 128-132  We will continue to monitor on BMP  Hypokalemia, intermittent.,  Unclear why potassium level low today given patient no longer having nausea or emesis.  Will follow up magnesium as well and replete as necessary  Chest pain, resolved.  Occurred once during hospitalization.  EKG at time of evaluation on 1/24 showed no acute ischemic changes, will continue to monitor.  Severe sinus bradycardia , new, improved/stable possibly related to  antibiotics for MAC therapy (discontinued on 1/24 by ID), since not on Beta-blockers or other known meds.  We will continue to monitor.  Acute pancreatitis, unclear etiology, resolved.  Initial lipase greater than 1000 on admission now down trended to 42.  MRCP showed no obstructive etiology.  Triglycerides of 400, Possibly related to HIV pathology.  Additionally possible dapsone could contribute to pink otitis/cholestasis.  Tolerating regular diet.    Transaminitis(resolved), hyperbilirubinemia, elevated INR(resolved), concerning for liver failure, unclear etiology, stable  Status post liver biopsy on 01/07/18,  Total bili stable 11.4, INR,AST and ALT resolved.  Now off MAC therapy given above-mentioned sinus bradycardia.  As mentioned in previous progress notes prior medications have been discontinued to determine if associated with poor liver function, maybe DILI drug-induced liver injury?Marland Kitchen  Autoimmune workup to date has been negative ( anti-smooth muscle, mitochondrial antibody, ceruloplasmin all negative). 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 hgb stable at 13.7 x 48 hours , now 11.8.  Required blood transfusion on 1/23. 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 No signs/symptoms of bleeding  Esophageal stricture. Dilation deferred in the setting of current thrombocytopenia.  Platelets to greater than 50, GI plans for potential EGD with dilation tomorrow, n.p.o. at midnight, continue clear liquids for the rest of the afternoon.  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 S/p Nplate on 0/07 Her oncologist  Dr. Marin Olp also following throughout her admission and believe her hematologic issues are related  to poorly controlled HIV. Platelets of  59 (down from 71).continue Decadron.  will continue to monitor.  Patient may need Nplate prior to potential EGD on 1/30  Lactic acidosis, unclear etiology, resolved.  No ischemic bowel, no hypotension or hypoxia since admission, no DKA ( though briefly put on insulin gtt and transferred to stepdown unit because of concern).  Peak 7.4 improved to 2.4 considered D lactic acidosis in the setting of poor clearance due to poor liver function. Temporarily held biktarvy. PCCM  previously consulted.   HIV, poorly controlled (CD4 count 8).    Follow-up AFB blood culture as we await path results from liver biopsy.  Continuing biktarvy. ID following  Esophageal candidiasis, stable.  Continue Anidulafungin, PPI and [name].  CMV stain was negative. ID following. Depending on duration of IV antifungal or ability to switch to oral given patient tolerating oral diet now for 48 hours I anticipate patient is getting closer to being able to go home   Steroid-induced hyperglycemia, stable.  Correction mealtime insulin.  Continue to monitor blood glucose levels  Hypomagnesemia. Resolved   Code Status: Full code  Family Communication: No family at bedside  Disposition Plan: f/u liver biopsy, monitor sodium, , potential EGD on 1/30?, monitor total bilirubin   Consultants: Infectious disease, PCCM, gastroenterology, Oncology  Procedures:  Liver biopsy 01/07/18  EEG on 01/06/18 mild diffuse slowing of cerebral activity nonspecific state (depression.  Toxic, metabolic, degenerative etiologies) no epileptiform activity recorded.  Antimicrobials:  Anidulafungin 1/21-1/22  Azithromycin 1/22>>1/24  Ethambutol 1/23>>1/24  Cefepime 1/18  Dapsone 1/17- 1/19  Fluconazole 1/15-1/19  Vancomycin 1/18  Cultures: Blood cultures x2, negative up-to-date (01/02/18) Urine  culture 01/02/18, staph, coagulase negative, 40,000 colonies Esophageal biopsy 01/02/18, CMV negative Acid fast 1/20 1/19 x 2 negative growth to date Acid fast and cultures pending from liver biopsy on 01/07/18  DVT prophylaxis: SCDs   Objective: Vitals:   01/12/18 2144 01/13/18 0150 01/13/18 0550 01/13/18 1400  BP: 140/90 (!) 152/81 (!) 140/91 140/88  Pulse: 71 61 78 67  Resp: _0 Temp: 97.8 F (36.6 C) 97.6 F (36.4 C) 97.9 F (36.6 C) 97.6 F (36.4 C)  TempSrc: Oral Oral Oral Oral  SpO2: 100% 100% 100% 100%  Weight:      Height:        Intake/Output Summary (Last 24 hours) at 01/13/2018 1549 Last data filed at 01/13/2018 1400 Gross per 24 hour  Intake 1740 ml  Output 255 ml  Net 1485 ml   Filed Weights   01/07/18 0401 01/11/18 0600 01/12/18 0631  Weight: 57.2 kg (126 lb 1.7 oz) 67.3 kg (148 lb 5.9 oz) 68.2 kg (150 lb 5.7 oz)    Exam:  General: Sitting in bed, pleasant in conversation Respiratory: Normal respiratory effort, no respiratory distress Abdomen: soft, non-distended, non-tender, no guarding, no rebound tenderness  Skin: No Rash Neurologic: Grossly no focal neuro deficit.Mental status AAOx3, Psychiatric: improved affect, normal mood  Data Reviewed: CBC: Recent Labs  Lab 01/09/18 0613 01/10/18 0602 01/11/18 0620 01/12/18 0601 01/13/18 0821  WBC 3.7* 3.3* 2.9* 4.3 2.9*  NEUTROABS 3.4 2.9 2.7 3.9 2.8  HGB 11.3* 11.5* 13.4 13.7 11.8*  HCT 31.6* 31.8* 37.4 38.4 32.9*  MCV 77.3* 77.0* 78.1 80.0 79.7  PLT 46* 60* 79* 71* 57*   Basic Metabolic Panel: Recent Labs  Lab 01/08/18 1828 01/09/18 0613 01/10/18 0602 01/11/18 0620 01/12/18 0601 01/13/18 0821  NA  --  135 127* 127* 132* 128*  K  --  3.1* 3.0* 3.8 3.8 3.2*  CL  --  106 103 104 107 103  CO2  --  22 19* 15* 19* 18*  GLUCOSE  --  191* 133* 257* 154* 207*  BUN  --  _0 CREATININE  --  <0.30* <0.30* 0.31* <0.30* 0.32*  CALCIUM  --  7.7* 7.0* 7.6* 7.9* 7.5*  MG 2.4 1.9   --   --   --   --    GFR: Estimated Creatinine Clearance: 100.6 mL/min (A) (by C-G formula based on SCr of 0.32 mg/dL (L)). Liver Function Tests: Recent Labs  Lab 01/09/18 7035 01/10/18 0602 01/11/18 0620 01/12/18 0601 01/13/18 0821  AST 96* 100* 122* 125* 114*  ALT 42 46 63* 76* 79*  ALKPHOS 456* 437* 501* 537* 465*  BILITOT 11.6* 11.3* 11.8* 11.4* 10.9*  PROT 5.1* 4.5* 5.1* 4.8* 4.9*  ALBUMIN 1.7* 1.6* 1.8* 1.9* 1.8*   Recent Labs  Lab 01/12/18 0601  LIPASE 42   No results for input(s): AMMONIA in the last 168 hours. Coagulation Profile: Recent Labs  Lab 01/09/18 0613 01/10/18 0602 01/11/18 0620 01/12/18 0601 01/13/18 0821  INR 1.00 1.02 0.99 0.94 0.91   Cardiac Enzymes: No results for input(s): CKTOTAL, CKMB, CKMBINDEX, TROPONINI in the last 168 hours. BNP (last 3 results) No results for input(s): PROBNP in the last 8760 hours. HbA1C: No results for input(s): HGBA1C in the last 72 hours. CBG: Recent Labs  Lab 01/12/18 0753 01/12/18 1211 01/12/18 1607 01/12/18 2258 01/13/18 0734  GLUCAP 119* 115* 110* 149* 168*   Lipid Profile: No results for input(s): CHOL, HDL, LDLCALC, TRIG, CHOLHDL, LDLDIRECT in the last 72 hours. Thyroid Function Tests: No results for input(s): TSH, T4TOTAL, FREET4, T3FREE, THYROIDAB in the last 72 hours. Anemia Panel: No results for input(s): VITAMINB12, FOLATE, FERRITIN, TIBC, IRON, RETICCTPCT in the last 72 hours. Urine analysis:    Component Value Date/Time   COLORURINE AMBER (A) 01/06/2018 1702   APPEARANCEUR HAZY (A) 01/06/2018 1702   LABSPEC 1.017 01/06/2018 1702   PHURINE 6.0 01/06/2018 1702   GLUCOSEU >=500 (A) 01/06/2018 1702   HGBUR SMALL (A) 01/06/2018 1702   BILIRUBINUR MODERATE (A) 01/06/2018 1702   KETONESUR NEGATIVE 01/06/2018 1702   PROTEINUR NEGATIVE 01/06/2018 1702   UROBILINOGEN 0.2 12/07/2014 1441   NITRITE NEGATIVE 01/06/2018 1702   LEUKOCYTESUR NEGATIVE 01/06/2018 1702   Sepsis  Labs: _1 (procalcitonin:4,lacticidven:4)  ) Recent Results (from the past 240 hour(s))  MRSA PCR Screening     Status: None   Collection Time: 01/06/18  6:59 PM  Result Value Ref Range Status   MRSA by PCR NEGATIVE NEGATIVE Final    Comment:        The GeneXpert MRSA Assay (FDA approved for NASAL specimens only), is one component of a comprehensive MRSA colonization surveillance program. It is not intended to diagnose MRSA infection nor to guide or monitor treatment for MRSA infections.   Aerobic/Anaerobic Culture (surgical/deep wound)     Status: None (Preliminary result)   Collection Time: 01/07/18  5:59 PM  Result Value Ref Range Status   Specimen Description LIVER BIOPSY  Final   Special Requests NONE  Final   Gram Stain   Final    MODERATE WBC PRESENT, PREDOMINANTLY MONONUCLEAR NO ORGANISMS SEEN    Culture   Final    HOLDING FOR POSSIBLE ANAEROBE CRITICAL RESULT CALLED TO, READ BACK BY AND VERIFIED WITH: D SCOTT,RN AT 1540 01/12/18 BY L BENFIELD CONCERNING GROWTH ON CULTURE  Performed at [facility], [place] [address] [place], [place] [address]    Report Status PENDING  Incomplete  Acid Fast Smear (AFB)     Status: None   Collection Time: 01/07/18  5:59 PM  Result Value Ref Range Status   AFB Specimen Processing Concentration  Final   Acid Fast Smear Negative  Final    Comment: (NOTE) Performed At: [facility] [place], [place] [id number] Rush Farmer MD PP:[id number]    Source (AFB) LIVER  Final    Comment: BIOPSY      Studies: No results found.  Scheduled Meds: . dexamethasone  40 mg Oral Daily  . feeding supplement  1 Container Oral TID BM  . folic acid  2 mg Oral Daily  . insulin aspart  0-9 Units Subcutaneous TID WC  . iopamidol  30 mL Oral Once  . multivitamin with minerals  1 tablet Oral Daily  . pantoprazole  40 mg Oral BID    Continuous Infusions: . anidulafungin Stopped (01/12/18 1841)     LOS: 14  days     Desiree Hane, MD [facility] Pager [phone]  If 7PM-7AM, please contact night-coverage [email or web address] Password [facility] 01/13/2018, 3:49 PM

## 2018-01-13 NOTE — Progress Notes (Signed)
[facility] for Infectious Disease    Date of Admission:  12/29/2017   Total days of antibiotics 15   ID: [name] is a 42 y.o. female with advanced hiv disease, admitted for idiopathic pancreatitis that improved but then developed ITP, and  Principal Problem:   Pancreatitis Active Problems:   HIV disease ([place])   Bipolar disorder ([place])   Hypokalemia   Esophageal stricture   Dysphagia   Candidal esophagitis ([facility])   Severe protein-calorie malnutrition ([facility])   Abdominal pain   Lactic acidosis   Sinus bradycardia    Subjective: Sleepy, not as much abdominal pain  Medications:  . dexamethasone  40 mg Oral Daily  . feeding supplement  1 Container Oral TID BM  . folic acid  2 mg Oral Daily  . insulin aspart  0-9 Units Subcutaneous TID WC  . iopamidol  30 mL Oral Once  . multivitamin with minerals  1 tablet Oral Daily  . pantoprazole  40 mg Oral BID    Objective: Vital signs in last 24 hours: Temp:  [97.6 F (36.4 C)-97.9 F (36.6 C)] 97.9 F (36.6 C) (01/29 0550) Pulse Rate:  [61-78] 78 (01/29 0550) Resp:  [18] 18 (01/29 0550) BP: (140-152)/(81-91) 140/91 (01/29 0550) SpO2:  [100 %] 100 % (01/29 0550)  Physical Exam  Constitutional:  oriented to person, place, and time. appears chronically ill, and mal-nourished. No distress.  HENT: [place]/AT, PERRLA, no scleral icterus Mouth/Throat: Oropharynx is clear and moist. No oropharyngeal exudate.  Cardiovascular: Normal rate, regular rhythm and normal heart sounds. Exam reveals no gallop and no friction rub.  No murmur heard.  Pulmonary/Chest: Effort normal and breath sounds normal. No respiratory distress.  has no wheezes.  Neck = supple, no nuchal rigidity Abdominal: Soft. Bowel sounds are normal.  exhibits no distension. There is no tenderness.  Lymphadenopathy: no cervical adenopathy. No axillary adenopathy Neurological: alert and oriented to person, place, and time.  Skin: Skin is warm and dry. No rash noted.  No erythema.  Psychiatric: a normal mood and affect.  behavior is normal.    Lab Results Recent Labs    01/12/18 0601 01/13/18 0821  WBC 4.3 2.9*  HGB 13.7 11.8*  HCT 38.4 32.9*  NA 132* 128*  K 3.8 3.2*  CL 107 103  CO2 19* 18*  BUN 11 10  CREATININE <0.30* 0.32*   Liver Panel Recent Labs    01/12/18 0601 01/13/18 0821  PROT 4.8* 4.9*  ALBUMIN 1.9* 1.8*  AST 125* 114*  ALT 76* 79*  ALKPHOS 537* 465*  BILITOT 11.4* 10.9*  BILIDIR 6.8* 6.8*  IBILI 4.6* 4.1*    Microbiology: Liver bx cx = still pending Studies/Results: No results found.   Assessment/Plan: Hepatitis -hyperbilirubinemia = path thought to be consistent giant cell hepatitis (either viral related vs drug induced) I suspect it maybe drug induce. We will hold her biktarvy and change to a different regimen that does not have tenofovir or emtricitabine which I think are more related with drug induced hepatitis. The down side is it will be bigger pillls and numerous pills.  hiv disease= will discontinue biktarvy stop for 2-3 days to see if it makes any impact on her tbili and transaminitis and change her to a different regimen.  eso candidiasis = continue with anidulafungin  Thrombocytopenia 2/2 ITP = appears trending down again. May need another dose of nplate vs ivig. Defer to dr Marin Olp  eso stricture = to undergo dilatation tomorrow with  dr schooler.  Addendum = pathologist stated that viral stains were negative thus this is likely drug induced hepatitis/hyperbilirubinemia  [facility] for Infectious Diseases Cell: [phone] Pager: [phone]  01/13/2018, 2:28 PM

## 2018-01-13 NOTE — Progress Notes (Signed)
[name] clearly looks better.  She is eating better.  She is going to have an esophageal dilation it sounds like tomorrow.  The liver biopsy results still is not back yet.  Her platelet count has come up slowly.  Her hemoglobin is doing wonderful.  Her white cell count is also come up.  Yesterday, her white cell count was 4.3.  Hemoglobin 13.7.  Platelet count 71,000.  There is no fever.  Her liver still shows moderate hyperbilirubinemia with a bilirubin of 11.4.  He will be interesting to see what the liver biopsy shows.  There certainly may be a component of ITP with her thrombocytopenia.  If her platelet count drops, I will give her another dose of Nplate.  I also may consider IVIG.  Again, I still feel that if her HIV is controlled, that her blood counts will improve.  She is on her anti-retroviral agents.  Her blood pressure has been a little on the higher side.  This morning is 140/91.  She was afebrile.  Pulse was 78.  I found nothing different on her physical exam.  There is no adenopathy.  She does have some scleral icterus.  Her lungs are clear.  Cardiac exam regular rate and rhythm.  We will continue to follow her blood counts.  Again, I suspect that this is all HIV associated.  I am just glad to see that she is looking better and eating better.  We had a very good prayer session this morning.  She was thankful that I could pray with her.  [name], MD  1 Thessalonians 5:16-18

## 2018-01-13 NOTE — H&P (View-Only) (Signed)
[facility] Progress Note  [name] 42 y.o. May 19, 1976   Subjective: Resting comfortably but easily arousable. Reports food is going down ok without trouble or vomiting. Denies abdominal pain.  Objective: Vital signs: Vitals:   01/13/18 0150 01/13/18 0550  BP: (!) 152/81 (!) 140/91  Pulse: 61 78  Resp: 18 18  Temp: 97.6 F (36.4 C) 97.9 F (36.6 C)  SpO2: 100% 100%    Physical Exam: Gen: lethargic, no acute distress  CV: RRR Chest: CTA B Abd: diffuse tenderness with guarding, soft, nondistended, +BS   Lab Results: Recent Labs    01/12/18 0601 01/13/18 0821  NA 132* 128*  K 3.8 3.2*  CL 107 103  CO2 19* 18*  GLUCOSE 154* 207*  BUN 11 10  CREATININE <0.30* 0.32*  CALCIUM 7.9* 7.5*   Recent Labs    01/12/18 0601 01/13/18 0821  AST 125* 114*  ALT 76* 79*  ALKPHOS 537* 465*  BILITOT 11.4* 10.9*  PROT 4.8* 4.9*  ALBUMIN 1.9* 1.8*   Recent Labs    01/11/18 0620 01/12/18 0601  WBC 2.9* 4.3  NEUTROABS 2.7 3.9  HGB 13.4 13.7  HCT 37.4 38.4  MCV 78.1 80.0  PLT 79* 71*      Assessment/Plan: Tight distal esophageal stricture with Candida esophagitis on IV antifungal therapy. I am not convinced that she is able to eat regular food without difficulty based on the recurrent tight esophageal stricture and needs repeat dilation prior to discharge. Will change to clear liquid diet this afternoon to make sure her upper tract is cleared of food prior to the planned EGD tomorrow. Await CBC and if platelets at least 50K will plan to do the EGD with dilation with fluoroscopy tomorrow at 11AM as currently scheduled. NPO p MN.   Joes C. 01/13/2018, 9:35 AM  Pager [phone]  AFTER 5 PM or on weekends please call [phone] ID: [name], female   DOB: 12-Apr-1976, 41 y.o.   MRN: [id number]

## 2018-01-13 NOTE — Progress Notes (Signed)
MD paged per IP nurse GI patho panel ordered 1/23 but not collected yet and patient not having loose stools.

## 2018-01-13 NOTE — Progress Notes (Signed)
[facility] Progress Note  LATIKA KRONICK 42 y.o. Jan 23, 1976   Subjective: Resting comfortably but easily arousable. Reports food is going down ok without trouble or vomiting. Denies abdominal pain.  Objective: Vital signs: Vitals:   01/13/18 0150 01/13/18 0550  BP: (!) 152/81 (!) 140/91  Pulse: 61 78  Resp: 18 18  Temp: 97.6 F (36.4 C) 97.9 F (36.6 C)  SpO2: 100% 100%    Physical Exam: Gen: lethargic, no acute distress  CV: RRR Chest: CTA B Abd: diffuse tenderness with guarding, soft, nondistended, +BS   Lab Results: Recent Labs    01/12/18 0601 01/13/18 0821  NA 132* 128*  K 3.8 3.2*  CL 107 103  CO2 19* 18*  GLUCOSE 154* 207*  BUN 11 10  CREATININE <0.30* 0.32*  CALCIUM 7.9* 7.5*   Recent Labs    01/12/18 0601 01/13/18 0821  AST 125* 114*  ALT 76* 79*  ALKPHOS 537* 465*  BILITOT 11.4* 10.9*  PROT 4.8* 4.9*  ALBUMIN 1.9* 1.8*   Recent Labs    01/11/18 0620 01/12/18 0601  WBC 2.9* 4.3  NEUTROABS 2.7 3.9  HGB 13.4 13.7  HCT 37.4 38.4  MCV 78.1 80.0  PLT 79* 71*      Assessment/Plan: Tight distal esophageal stricture with Candida esophagitis on IV antifungal therapy. I am not convinced that she is able to eat regular food without difficulty based on the recurrent tight esophageal stricture and needs repeat dilation prior to discharge. Will change to clear liquid diet this afternoon to make sure her upper tract is cleared of food prior to the planned EGD tomorrow. Await CBC and if platelets at least 50K will plan to do the EGD with dilation with fluoroscopy tomorrow at 11AM as currently scheduled. NPO p MN.   Power C. 01/13/2018, 9:35 AM  Pager [phone]  AFTER 5 PM or on weekends please call [phone] ID: [name], female   DOB: 17-Apr-1976, 42 y.o.   MRN: [id number]

## 2018-01-14 ENCOUNTER — Inpatient Hospital Stay (HOSPITAL_COMMUNITY): Payer: Medicaid Other | Admitting: Registered Nurse

## 2018-01-14 ENCOUNTER — Encounter (HOSPITAL_COMMUNITY)
Admission: EM | Disposition: A | Discharge status: Home / Self Care | Payer: Self-pay | Source: Home / Self Care | Attending: Family Medicine

## 2018-01-14 ENCOUNTER — Inpatient Hospital Stay (HOSPITAL_COMMUNITY): Payer: Medicaid Other

## 2018-01-14 ENCOUNTER — Encounter (HOSPITAL_COMMUNITY): Payer: Self-pay | Admitting: *Deleted

## 2018-01-14 HISTORY — PX: ESOPHAGOGASTRODUODENOSCOPY (EGD) WITH PROPOFOL: SHX5813

## 2018-01-14 LAB — GLUCOSE, CAPILLARY
GLUCOSE-CAPILLARY: 67 mg/dL (ref 65–99)
GLUCOSE-CAPILLARY: 75 mg/dL (ref 65–99)
GLUCOSE-CAPILLARY: 136 mg/dL — AB (ref 65–99)
Glucose-Capillary: 59 mg/dL — ABNORMAL LOW (ref 65–99)
Glucose-Capillary: 70 mg/dL (ref 65–99)
Glucose-Capillary: 86 mg/dL (ref 65–99)

## 2018-01-14 LAB — CBC WITH DIFFERENTIAL/PLATELET
BASOS ABS: 0 10*3/uL (ref 0.0–0.1)
Basophils Relative: 0 %
EOS ABS: 0 10*3/uL (ref 0.0–0.7)
Eosinophils Relative: 1 %
HCT: 34.1 % — ABNORMAL LOW (ref 36.0–46.0)
Hemoglobin: 11.9 g/dL — ABNORMAL LOW (ref 12.0–15.0)
LYMPHS PCT: 4 %
Lymphs Abs: 0.1 10*3/uL — ABNORMAL LOW (ref 0.7–4.0)
MCH: 28.2 pg (ref 26.0–34.0)
MCHC: 34.9 g/dL (ref 30.0–36.0)
MCV: 80.8 fL (ref 78.0–100.0)
MONO ABS: 0.2 10*3/uL (ref 0.1–1.0)
Monocytes Relative: 8 %
NEUTROS ABS: 2.5 10*3/uL (ref 1.7–7.7)
NEUTROS PCT: 87 %
PLATELETS: 65 10*3/uL — AB (ref 150–400)
RBC: 4.22 MIL/uL (ref 3.87–5.11)
RDW: 24.8 % — AB (ref 11.5–15.5)
WBC: 2.8 10*3/uL — ABNORMAL LOW (ref 4.0–10.5)

## 2018-01-14 LAB — HEPATIC FUNCTION PANEL
ALT: 82 U/L — ABNORMAL HIGH (ref 14–54)
AST: 111 U/L — ABNORMAL HIGH (ref 15–41)
Albumin: 1.8 g/dL — ABNORMAL LOW (ref 3.5–5.0)
Alkaline Phosphatase: 448 U/L — ABNORMAL HIGH (ref 38–126)
BILIRUBIN INDIRECT: 4.6 mg/dL — AB (ref 0.3–0.9)
Bilirubin, Direct: 6.1 mg/dL — ABNORMAL HIGH (ref 0.1–0.5)
Total Bilirubin: 10.7 mg/dL — ABNORMAL HIGH (ref 0.3–1.2)
Total Protein: 4.7 g/dL — ABNORMAL LOW (ref 6.5–8.1)

## 2018-01-14 LAB — BASIC METABOLIC PANEL
Anion gap: 5 (ref 5–15)
BUN: 8 mg/dL (ref 6–20)
CALCIUM: 7.9 mg/dL — AB (ref 8.9–10.3)
CO2: 22 mmol/L (ref 22–32)
Chloride: 107 mmol/L (ref 101–111)
Creatinine, Ser: 0.33 mg/dL — ABNORMAL LOW (ref 0.44–1.00)
GFR calc Af Amer: >60 mL/min (ref >60)
Glucose, Bld: 74 mg/dL (ref 65–99)
Potassium: 3.2 mmol/L — ABNORMAL LOW (ref 3.5–5.1)
SODIUM: 134 mmol/L — AB (ref 135–145)

## 2018-01-14 LAB — PROTIME-INR
INR: 0.94
PROTHROMBIN TIME: 12.5 s (ref 11.4–15.2)

## 2018-01-14 LAB — ANTINUCLEAR ANTIBODIES, IFA: ANA Ab, IFA: NEGATIVE

## 2018-01-14 SURGERY — ESOPHAGOGASTRODUODENOSCOPY (EGD) WITH PROPOFOL
Anesthesia: Monitor Anesthesia Care

## 2018-01-14 MED ORDER — DEXTROSE 50 % IV SOLN
1.0000 | Freq: Once | INTRAVENOUS | Status: AC
  Administered 2018-01-14: 50 mL via INTRAVENOUS

## 2018-01-14 MED ORDER — POTASSIUM CHLORIDE 10 MEQ/100ML IV SOLN
10.0000 meq | INTRAVENOUS | Status: AC
  Administered 2018-01-14 (×2): 10 meq via INTRAVENOUS
  Filled 2018-01-14 (×2): qty 100

## 2018-01-14 MED ORDER — DEXAMETHASONE 4 MG PO TABS
20.0000 mg | ORAL_TABLET | Freq: Every day | ORAL | Status: DC
  Administered 2018-01-15: 20 mg via ORAL
  Filled 2018-01-14: qty 5

## 2018-01-14 MED ORDER — POTASSIUM CHLORIDE 10 MEQ/100ML IV SOLN
10.0000 meq | INTRAVENOUS | Status: DC
  Filled 2018-01-14 (×2): qty 100

## 2018-01-14 MED ORDER — PANTOPRAZOLE SODIUM 40 MG IV SOLR
40.0000 mg | Freq: Two times a day (BID) | INTRAVENOUS | Status: DC
  Administered 2018-01-14 – 2018-01-20 (×12): 40 mg via INTRAVENOUS
  Filled 2018-01-14 (×12): qty 40

## 2018-01-14 MED ORDER — FUROSEMIDE 10 MG/ML IJ SOLN
40.0000 mg | Freq: Every day | INTRAMUSCULAR | Status: DC
  Administered 2018-01-14 – 2018-01-18 (×5): 40 mg via INTRAVENOUS
  Filled 2018-01-14 (×5): qty 4

## 2018-01-14 MED ORDER — SODIUM CHLORIDE 0.9 % IV SOLN: INTRAVENOUS | Status: DC

## 2018-01-14 MED ORDER — FUROSEMIDE 10 MG/ML IJ SOLN: 40.0000 mg | Freq: Two times a day (BID) | INTRAMUSCULAR | Status: DC

## 2018-01-14 MED ORDER — LACTATED RINGERS IV SOLN
INTRAVENOUS | Status: DC
  Administered 2018-01-14: 11:00:00 via INTRAVENOUS

## 2018-01-14 MED ORDER — DEXTROSE 50 % IV SOLN
INTRAVENOUS | Status: AC
  Filled 2018-01-14: qty 50

## 2018-01-14 MED ORDER — LIDOCAINE 2% (20 MG/ML) 5 ML SYRINGE
INTRAMUSCULAR | Status: DC | PRN
  Administered 2018-01-14: 40 mg via INTRAVENOUS

## 2018-01-14 MED ORDER — PROPOFOL 10 MG/ML IV BOLUS
INTRAVENOUS | Status: AC
  Filled 2018-01-14: qty 60

## 2018-01-14 MED ORDER — SPIRONOLACTONE 25 MG PO TABS
50.0000 mg | ORAL_TABLET | Freq: Every day | ORAL | Status: DC
  Administered 2018-01-14: 50 mg via ORAL
  Filled 2018-01-14 (×2): qty 2

## 2018-01-14 MED ORDER — PROPOFOL 500 MG/50ML IV EMUL
INTRAVENOUS | Status: DC | PRN
  Administered 2018-01-14: 125 ug/kg/min via INTRAVENOUS

## 2018-01-14 MED ORDER — POTASSIUM CHLORIDE CRYS ER 20 MEQ PO TBCR
40.0000 meq | EXTENDED_RELEASE_TABLET | Freq: Every day | ORAL | Status: DC
  Filled 2018-01-14: qty 2

## 2018-01-14 MED ORDER — ONDANSETRON HCL 4 MG/2ML IJ SOLN
INTRAMUSCULAR | Status: DC | PRN
  Administered 2018-01-14: 4 mg via INTRAVENOUS

## 2018-01-14 MED ORDER — PROPOFOL 10 MG/ML IV BOLUS
INTRAVENOUS | Status: DC | PRN
  Administered 2018-01-14: 20 mg via INTRAVENOUS
  Administered 2018-01-14: 30 mg via INTRAVENOUS

## 2018-01-14 MED ORDER — DEXTROSE 50 % IV SOLN
25.0000 mL | Freq: Once | INTRAVENOUS | Status: AC
  Administered 2018-01-14: 25 mL via INTRAVENOUS

## 2018-01-14 SURGICAL SUPPLY — 15 items

## 2018-01-14 NOTE — Progress Notes (Signed)
Hypoglycemic Event  CBG: 67   Treatment: D50 IV 25 mL  Symptoms: None  Follow-up CBG: Time: 1105 CBG Result:86  Possible Reasons for Event: Inadequate meal intake  Comments/MD notified: Dr. Lissa Hoard aware    [name], [name]

## 2018-01-14 NOTE — Anesthesia Preprocedure Evaluation (Signed)
Anesthesia Evaluation  Patient identified by MRN, date of birth, ID band Patient awake    Reviewed: Allergy & Precautions, NPO status , Patient's Chart, lab work & pertinent test results  History of Anesthesia Complications Negative for: history of anesthetic complications  Airway Mallampati: I   Neck ROM: full    Dental  (+) Teeth Intact   Pulmonary shortness of breath, asthma , pneumonia,    breath sounds clear to auscultation       Cardiovascular negative cardio ROS   Rhythm:regular Rate:Normal     Neuro/Psych PSYCHIATRIC DISORDERS Anxiety Depression Bipolar Disorder Schizophrenia negative neurological ROS     GI/Hepatic PUD, GERD  ,(+) Hepatitis -, B  Endo/Other    Renal/GU Renal disease     Musculoskeletal   Abdominal   Peds  Hematology  (+) anemia , HIV,   Anesthesia Other Findings   Reproductive/Obstetrics                             Anesthesia Physical  Anesthesia Plan  ASA: II  Anesthesia Plan: MAC   Post-op Pain Management:    Induction: Intravenous  PONV Risk Score and Plan:   Airway Management Planned: Simple Face Mask  Additional Equipment: None  Intra-op Plan:   Post-operative Plan:   Informed Consent: I have reviewed the patients History and Physical, chart, labs and discussed the procedure including the risks, benefits and alternatives for the proposed anesthesia with the patient or authorized representative who has indicated his/her understanding and acceptance.   Dental advisory given  Plan Discussed with: CRNA  Anesthesia Plan Comments:         Anesthesia Quick Evaluation

## 2018-01-14 NOTE — Progress Notes (Signed)
                       PROGRESS NOTE        PATIENT DETAILS Name: [name] Age: 41 y.o. Sex: female Date of Birth: 07/17/1976 Admit Date: 12/29/2017 Admitting Physician Rondell A Smith, MD PCP:Avbuere, Edwin, MD  Brief Narrative: Patient is a 41 y.o. female with history of HIV who presented to the hospital with worsening abdominal pain-felt to be secondary to acute pancreatitis.  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  She slowly continues to improve-see below for further details  Subjective: Has significant swelling of her lower extremities up to her lower thighs.  Wants to go home.  Assessment/Plan: Acute pancreatitis: Unclear etiology-patient is status post cholecystectomy-no history of alcohol use.  MRCP of the abdomen negative for biliary obstruction/pathology.  Pancreatitis seems to have resolved with supportive care.  Acute hepatitis: Acute hepatitis panel was negative-etiology was unknown-hence underwent liver biopsy on 1/23.  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 Thought to be secondary to medications-all antiretrovirals are currently on hold.  Discussed with GI-recommendations are to start ursodiol-however ID-Dr. Snider recommends that we hold off for now.  Esophageal stricture: Underwent EGD and dilatation on 1/30-advance diet.  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    Esophageal candidiasis: Appears to be stable-continue  anidulafungin-we will await further recommendations from ID  HIV: poorly controlled-last CD4 count of 8-antiretrovirals on hold due to ongoing hepatitis.  ID plans on restarting an alternative antiretroviral agent  Steroid-induced hyperglycemia: Suspect will continue to improve as steroids are being tapered down-had hypoglycemic episode this morning.  Leukopenia/mild anemia: Likely secondary to underlying HIV.  Continue to follow.  Massive volume overload: Secondary to acute illness/hepatitis and severe hypoalbuminemia-start diuretics and follow.  Weight (not sure if accurate) has increased to 162 pounds from 102 pounds on admission).  Note UA negative for anuria.  Hypokalemia: Replete and recheck  Sinus bradycardia: Continue telemetry monitoring-avoid rate limiting agents  DVT Prophylaxis: SCD's  Code Status: Full code   Family Communication: None at bedside  Disposition Plan: Remain inpatient-spoke with infectious disease-recommendations are to discharge early next week-most likely on Monday.  Antimicrobial agents: Anti-infectives (From admission, onward)   Start     Dose/Rate Route Frequency Ordered Stop   01/07/18 2000  bictegravir-emtricitabine-tenofovir AF (BIKTARVY) 50-200-25 MG per tablet 1 tablet  Status:  Discontinued     1 tablet Oral Daily 01/07/18 1449 01/13/18 1356   01/07/18 1000  ethambutol (MYAMBUTOL) tablet 850 mg  Status:  Discontinued     15 mg/kg  55.2 kg Oral Daily 01/06/18 1739 01/08/18 1420   01/06/18 1800  azithromycin (ZITHROMAX) 500 mg in dextrose 5 % 250 mL IVPB  Status:  Discontinued     500 mg 250 mL/hr over 60 Minutes Intravenous Every 24 hours 01/06/18 1737 01/08/18 1420   01/06/18 1200  bictegravir-emtricitabine-tenofovir AF (BIKTARVY) 50-200-25 MG per tablet 1 tablet  Status:  Discontinued     1 tablet Oral Daily 01/05/18 1626 01/06/18 1741     01/05/18 1500  anidulafungin (ERAXIS) 200 mg in sodium chloride 0.9 % 200 mL IVPB     200 mg 78 mL/hr  over 200 Minutes Intravenous Every 24 hours 01/05/18 1331     01/02/18 2200  vancomycin (VANCOCIN) 500 mg in sodium chloride 0.9 % 100 mL IVPB  Status:  Discontinued     500 mg 100 mL/hr over 60 Minutes Intravenous Every 12 hours 01/02/18 1111 01/02/18 1559   01/02/18 1400  ceFEPIme (MAXIPIME) 1 g in dextrose 5 % 50 mL IVPB  Status:  Discontinued     1 g 100 mL/hr over 30 Minutes Intravenous Every 8 hours 01/02/18 1111 01/02/18 1559   01/02/18 1200  vancomycin (VANCOCIN) IVPB 1000 mg/200 mL premix     1,000 mg 200 mL/hr over 60 Minutes Intravenous  Once 01/02/18 1111 01/02/18 1344   12/31/17 2000  dapsone tablet 100 mg  Status:  Discontinued     100 mg Oral Daily 12/31/17 1859 01/03/18 1619   12/31/17 1000  fluconazole (DIFLUCAN) IVPB 100 mg  Status:  Discontinued     100 mg 50 mL/hr over 60 Minutes Intravenous Every 24 hours 12/30/17 1001 12/30/17 1431   12/31/17 1000  fluconazole (DIFLUCAN) IVPB 400 mg  Status:  Discontinued     400 mg 100 mL/hr over 120 Minutes Intravenous Every 24 hours 12/30/17 1431 01/03/18 1111   12/30/17 1100  fluconazole (DIFLUCAN) IVPB 200 mg     200 mg 100 mL/hr over 60 Minutes Intravenous  Once 12/30/17 1001 12/30/17 1220   12/30/17 1000  bictegravir-emtricitabine-tenofovir AF (BIKTARVY) 50-200-25 MG per tablet 1 tablet  Status:  Discontinued     1 tablet Oral Daily 12/30/17 0933 01/05/18 1544      Procedures: 1/30>> EGD 1/23>> liver biopsy  CONSULTS:  ID, GI and hematology/oncology  Time spent: 25-minutes-Greater than 50% of this time was spent in counseling, explanation of diagnosis, planning of further management, and coordination of care.  MEDICATIONS: Scheduled Meds: . dexamethasone  40 mg Oral Daily  . feeding supplement  1 Container Oral TID BM  . folic acid  2 mg Oral Daily  . insulin aspart  0-9 Units Subcutaneous TID WC  . iopamidol  30 mL Oral Once  . multivitamin with minerals  1 tablet Oral Daily  . pantoprazole (PROTONIX) IV  40  mg Intravenous Q12H   Continuous Infusions: . anidulafungin Stopped (01/13/18 2041)   PRN Meds:.albuterol, hydrALAZINE, hyoscyamine, ondansetron **OR** ondansetron (ZOFRAN) IV, oxyCODONE   PHYSICAL EXAM: Vital signs: Vitals:   01/14/18 1200 01/14/18 1205 01/14/18 1210 01/14/18 1220  BP: (!) 159/75  138/78   Pulse: (!) 57 68 66 (!) 55  Resp: 11 14 16 14  Temp:      TempSrc:      SpO2: 99% 97% 100% 99%  Weight:      Height:       Filed Weights   01/11/18 0600 01/12/18 0631 01/14/18 0456  Weight: 67.3 kg (148 lb 5.9 oz) 68.2 kg (150 lb 5.7 oz) 73.6 kg (162 lb 4.1 oz)   Body mass index is 21.41 kg/m.   General appearance :Awake, alert, not in any distress.  Eyes:, pupils equally reactive to light and accomodation HEENT: Atraumatic and Normocephalic Neck: supple, no JVD. No cervical lymphadenopathy.  Resp:Good air entry bilaterally, no added sounds  CVS: S1 S2 regular, no murmurs.  GI: Bowel sounds present, mildly tender in the epigastric area-soft and nondistended.  Extremities: B/L Lower Ext shows +++ edema, both   legs are warm to touch Neurology:  speech clear,Non focal, sensation is grossly intact. Psychiatric: Normal judgment and insight. Alert and oriented x 3. Normal mood. Musculoskeletal:No digital cyanosis Skin:No Rash, warm and dry Wounds:N/A  I have personally reviewed following labs and imaging studies  LABORATORY DATA: CBC: Recent Labs  Lab 01/10/18 0602 01/11/18 0620 01/12/18 0601 01/13/18 0821 01/14/18 0624  WBC 3.3* 2.9* 4.3 2.9* 2.8*  NEUTROABS 2.9 2.7 3.9 2.8 2.5  HGB 11.5* 13.4 13.7 11.8* 11.9*  HCT 31.8* 37.4 38.4 32.9* 34.1*  MCV 77.0* 78.1 80.0 79.7 80.8  PLT 60* 79* 71* 57* 65*    Basic Metabolic Panel: Recent Labs  Lab 01/08/18 1828 01/09/18 0613 01/10/18 0602 01/11/18 0620 01/12/18 0601 01/13/18 0821 01/14/18 0624  NA  --  135 127* 127* 132* 128* 134*  K  --  3.1* 3.0* 3.8 3.8 3.2* 3.2*  CL  --  106 103 104 107 103 107  CO2   --  22 19* 15* 19* 18* 22  GLUCOSE  --  191* 133* 257* 154* 207* 74  BUN  --  10 10 11 11 10 8  CREATININE  --  <0.30* <0.30* 0.31* <0.30* 0.32* 0.33*  CALCIUM  --  7.7* 7.0* 7.6* 7.9* 7.5* 7.9*  MG 2.4 1.9  --   --   --  1.4*  --     GFR: Estimated Creatinine Clearance: 108.6 mL/min (A) (by C-G formula based on SCr of 0.33 mg/dL (L)).  Liver Function Tests: Recent Labs  Lab 01/10/18 0602 01/11/18 0620 01/12/18 0601 01/13/18 0821 01/14/18 0624  AST 100* 122* 125* 114* 111*  ALT 46 63* 76* 79* 82*  ALKPHOS 437* 501* 537* 465* 448*  BILITOT 11.3* 11.8* 11.4* 10.9* 10.7*  PROT 4.5* 5.1* 4.8* 4.9* 4.7*  ALBUMIN 1.6* 1.8* 1.9* 1.8* 1.8*   Recent Labs  Lab 01/12/18 0601  LIPASE 42   No results for input(s): AMMONIA in the last 168 hours.  Coagulation Profile: Recent Labs  Lab 01/10/18 0602 01/11/18 0620 01/12/18 0601 01/13/18 0821 01/14/18 0624  INR 1.02 0.99 0.94 0.91 0.94    Cardiac Enzymes: No results for input(s): CKTOTAL, CKMB, CKMBINDEX, TROPONINI in the last 168 hours.  BNP (last 3 results) No results for input(s): PROBNP in the last 8760 hours.  HbA1C: No results for input(s): HGBA1C in the last 72 hours.  CBG: Recent Labs  Lab 01/13/18 2131 01/14/18 0850 01/14/18 0954 01/14/18 1048 01/14/18 1106  GLUCAP 130* 59* 70 67 86    Lipid Profile: No results for input(s): CHOL, HDL, LDLCALC, TRIG, CHOLHDL, LDLDIRECT in the last 72 hours.  Thyroid Function Tests: No results for input(s): TSH, T4TOTAL, FREET4, T3FREE, THYROIDAB in the last 72 hours.  Anemia Panel: No results for input(s): VITAMINB12, FOLATE, FERRITIN, TIBC, IRON, RETICCTPCT in the last 72 hours.  Urine analysis:    Component Value Date/Time   COLORURINE AMBER (A) 01/06/2018 1702   APPEARANCEUR HAZY (A) 01/06/2018 1702   LABSPEC 1.017 01/06/2018 1702   PHURINE 6.0 01/06/2018 1702   GLUCOSEU >=500 (A) 01/06/2018 1702   HGBUR SMALL (A) 01/06/2018 1702   BILIRUBINUR MODERATE (A)  01/06/2018 1702   KETONESUR NEGATIVE 01/06/2018 1702   PROTEINUR NEGATIVE 01/06/2018 1702   UROBILINOGEN 0.2 12/07/2014 1441   NITRITE NEGATIVE 01/06/2018 1702   LEUKOCYTESUR NEGATIVE 01/06/2018 1702    Sepsis Labs: Lactic Acid, Venous    Component Value Date/Time   LATICACIDVEN 2.4 (HH) 01/07/2018 0431    MICROBIOLOGY: Recent Results (  from the past 240 hour(s))  MRSA PCR Screening     Status: None   Collection Time: 01/06/18  6:59 PM  Result Value Ref Range Status   MRSA by PCR NEGATIVE NEGATIVE Final    Comment:        The GeneXpert MRSA Assay (FDA approved for NASAL specimens only), is one component of a comprehensive MRSA colonization surveillance program. It is not intended to diagnose MRSA infection nor to guide or monitor treatment for MRSA infections.   Aerobic/Anaerobic Culture (surgical/deep wound)     Status: None (Preliminary result)   Collection Time: 01/07/18  5:59 PM  Result Value Ref Range Status   Specimen Description LIVER BIOPSY  Final   Special Requests NONE  Final   Gram Stain   Final    MODERATE WBC PRESENT, PREDOMINANTLY MONONUCLEAR NO ORGANISMS SEEN    Culture   Final    HOLDING FOR POSSIBLE ANAEROBE CRITICAL RESULT CALLED TO, READ BACK BY AND VERIFIED WITH: D SCOTT,RN AT 1540 01/12/18 BY L BENFIELD CONCERNING GROWTH ON CULTURE Performed at [facility], [place] [address] [place], [place] [address]    Report Status PENDING  Incomplete  Acid Fast Smear (AFB)     Status: None   Collection Time: 01/07/18  5:59 PM  Result Value Ref Range Status   AFB Specimen Processing Concentration  Final   Acid Fast Smear Negative  Final    Comment: (NOTE) Performed At: [facility] [place], [place] [id number] Rush Farmer MD OV:[id number]    Source (AFB) LIVER  Final    Comment: BIOPSY  Fungus Culture With Stain     Status: None (Preliminary result)   Collection Time: 01/07/18  5:59 PM  Result Value Ref Range Status    Fungus Stain Final report  Final    Comment: (NOTE) Performed At: [facility] [place], [place] [id number] Rush Farmer MD SE:[id number]    Fungus (Mycology) Culture PENDING  Incomplete   Fungal Source LIVER  Final    Comment: BIOPSY  Fungus Culture Result     Status: None   Collection Time: 01/07/18  5:59 PM  Result Value Ref Range Status   Result 1 Comment  Final    Comment: (NOTE) KOH/Calcofluor preparation:  no fungus observed. Performed At: [facility] [place], [place] [id number] Rush Farmer MD FG:[id number]     RADIOLOGY STUDIES/RESULTS: Dg Chest 2 View  Result Date: 12/31/2017 CLINICAL DATA:  Nipple marker request EXAM: CHEST  2 VIEW COMPARISON:  1100 hours FINDINGS: Left basilar nodular density corresponds to the nipple marker. Allowing for nipple shadows, lungs are clear other than minimal subsegmental atelectasis for scarring at the left base. Lungs are clear. No pneumothorax or pleural effusion. Mild cardiomegaly. Normal vascularity. IMPRESSION: No active cardiopulmonary disease. Electronically Signed   By: Marybelle Killings M.D.   On: 12/31/2017 19:13   Ct Head Wo Contrast  Result Date: 01/05/2018 CLINICAL DATA:  42 year old female with history of altered level of consciousness. Fall this morning. Possible seizure. EXAM: CT HEAD WITHOUT CONTRAST TECHNIQUE: Contiguous axial images were obtained from the base of the skull through the vertex without intravenous contrast. COMPARISON:  Head CT 03/10/2012. FINDINGS: Brain: No evidence of acute infarction, hemorrhage, hydrocephalus, extra-axial collection or mass lesion/mass effect. Vascular: No hyperdense vessel or unexpected calcification. Skull: Normal. Negative for fracture or focal lesion. Sinuses/Orbits: No acute finding. Other: None. IMPRESSION: 1. No acute intracranial abnormalities. The appearance of the  brain is normal. Electronically Signed   By: Daniel  Entrikin M.D.   On:  01/05/2018 11:01   Ct Abdomen Pelvis W Contrast  Result Date: 01/06/2018 CLINICAL DATA:  Abdominal distension with elevated lactate EXAM: CT ABDOMEN AND PELVIS WITH CONTRAST TECHNIQUE: Multidetector CT imaging of the abdomen and pelvis was performed using the standard protocol following bolus administration of intravenous contrast. CONTRAST:  100mL ISOVUE-300 IOPAMIDOL (ISOVUE-300) INJECTION 61% COMPARISON:  MRI 12/31/2017, CT abdomen pelvis 12/30/2017, 10/23/2017, 10/21/2017, 10/13/2017, 10/16/2016 FINDINGS: Lower chest: Lung bases demonstrate moderate pleural effusions, increased compared to prior. Partial consolidation in the lower lobes, atelectasis versus pneumonia. Mild cardiomegaly. Mild distal esophageal and GE junction thickening. Hepatobiliary: No focal hepatic abnormality. Surgical clips at the gallbladder fossa. No biliary dilatation Pancreas: Decreased pancreatic edema.  No ductal dilatation Spleen: Interval enlargement of the spleen now measuring 14 cm on coronal views. Adrenals/Urinary Tract: Adrenal glands are unremarkable. Kidneys are normal, without renal calculi, focal lesion, or hydronephrosis. Bladder is unremarkable. Stomach/Bowel: Stomach is unremarkable. Fluid-filled loops of small bowel within the abdomen. No significant wall thickening. No intramural air. Normal appendix. No colon wall thickening. Vascular/Lymphatic: Nonaneurysmal aorta. Patent portal vein. Non enhancement of the splenic vein consistent with occlusion, new since the comparison CT. Enlarged retroperitoneal lymph nodes as before. Reproductive: Uterus and bilateral adnexa are unremarkable. Other: Large volume of ascites, increased compared to prior. No free air. Musculoskeletal: Degenerative changes. No acute or suspicious abnormality. IMPRESSION: 1. Moderate bilateral pleural effusions, increased compared to prior. Partial consolidations in the bilateral lower lobes may reflect atelectasis or pneumonia 2. Interim finding  of non enhancement of the splenic vein, consistent with splenic vein thrombosis. Portal vein remains patent. Interval enlargement of the spleen. 3. Decreased pancreatic edema. Large volume of ascites in the abdomen and pelvis, increased compared to prior. 4. Fluid-filled loops of central small bowel, possible ileus. No intramural air or portal venous gas. 5. Stable retroperitoneal lymphadenopathy Electronically Signed   By: Kim  Fujinaga M.D.   On: 01/06/2018 23:28   Ct Abdomen Pelvis W Contrast  Result Date: 12/30/2017 CLINICAL DATA:  Generalized abdominal pain, vomiting and hematuria. HIV positive. Elevated lipase. Prior cholecystectomy. EXAM: CT ABDOMEN AND PELVIS WITH CONTRAST TECHNIQUE: Multidetector CT imaging of the abdomen and pelvis was performed using the standard protocol following bolus administration of intravenous contrast. CONTRAST:  80 cc Isovue-300 IV. COMPARISON:  10/21/2017 CT abdomen/pelvis. FINDINGS: Lower chest: No significant pulmonary nodules or acute consolidative airspace disease. Hepatobiliary: Normal liver size. No liver mass. Cholecystectomy. No biliary ductal dilatation. Common bile duct diameter 5 mm. Pancreas: There is acute diffuse pancreatic enlargement with prominent pancreatic and peripancreatic edema with ill-defined peripancreatic fluid extending into the bilateral anterior paranephric retroperitoneal spaces and into the lesser sac, compatible with acute pancreatitis. No regions of pancreatic parenchymal nonenhancement or gas. No pancreatic mass or duct dilation. No measurable peripancreatic fluid collections. Spleen: Normal size. No mass. Adrenals/Urinary Tract: Normal adrenals. Normal kidneys with no hydronephrosis and no renal mass. Normal bladder. Stomach/Bowel: Normal non-distended stomach. Normal caliber small bowel with no small bowel wall thickening. Appendix. Normal large bowel with no diverticulosis, large bowel wall thickening or pericolonic fat stranding.  Vascular/Lymphatic: Normal caliber abdominal aorta. Patent portal, splenic, hepatic and renal veins. Stable mildly enlarged 1.0 cm right retrocrural node (series 2/image 15). Stable mildly enlarged 1.2 cm porta hepatis node (series 2/image 22). Stable mild left para-aortic adenopathy measuring up to 1.4 cm (series 2/image 31). No new pathologically enlarged abdominopelvic nodes. Reproductive: Grossly normal   uterus.  No adnexal mass. Other: Small volume ascites. No pneumoperitoneum. No focal fluid collection. Musculoskeletal: No aggressive appearing focal osseous lesions. IMPRESSION: 1. Acute pancreatitis diffusely involving the pancreas, without complication. 2. Small volume ascites. 3. Chronic porta hepatis and retroperitoneal adenopathy is stable and probably reactive. Electronically Signed   By: Ilona Sorrel M.D.   On: 12/30/2017 02:45   Ir Venogram Hepatic W Hemodynamic Evaluation  Result Date: 01/07/2018 INDICATION: 42 year old female with a complex medical history including markedly abnormal liver enzymes. Request for liver biopsy. Patient has thrombocytopenia and received platelets prior to the procedure. EXAM: TRANSJUGULAR LIVER BIOPSY WITH FLUOROSCOPY HEPATIC VENOGRAPHY WITH PRESSURES ULTRASOUND GUIDANCE FOR VASCULAR ACCESS MEDICATIONS: None. ANESTHESIA/SEDATION: Moderate (conscious) sedation was employed during this procedure. A total of Versed 1 mg and Fentanyl 25 mcg was administered intravenously. Moderate Sedation Time: 41 minutes. The patient's level of consciousness and vital signs were monitored continuously by radiology nursing throughout the procedure under my direct supervision. FLUOROSCOPY TIME:  Fluoroscopy Time: 7 minutes and 54 seconds. 664 mGy COMPLICATIONS: None immediate. PROCEDURE: Informed written consent was obtained from the patient after a thorough discussion of the procedural risks, benefits and alternatives. All questions were addressed. Maximal Sterile Barrier Technique was  utilized including caps, mask, sterile gowns, sterile gloves, sterile drape, hand hygiene and skin antiseptic. A timeout was performed prior to the initiation of the procedure. Ultrasound confirmed a patent right internal jugular vein. The right side of the neck was prepped and draped in a sterile fashion. Skin was anesthetized with 1% lidocaine. 21 gauge needle directed into the right internal jugular vein with ultrasound guidance. Micropuncture dilator set was placed. Five French catheter was advanced into the IVC and a Bentson wire was placed. The tract was dilated to accommodate a 10 [place] sheath. 5 French catheter was advanced into a right hepatic vein. Hepatic venography was performed. Wedged and free hepatic vein pressures were obtained. The transjugular biopsy kit was then advanced through the sheath over a stiff Amplatz wire. Total of 5 core biopsies were performed. Four adequate core biopsies were obtained. Three specimens placed in formalin for pathology. One specimen was placed in saline for microbiology. Follow-up venography was performed through the 5 French catheter at of the procedure. Sheath were removed with manual compression. Fluoroscopic and ultrasound images were taken and saved for documentation. FINDINGS: Two sets of hepatic venous pressures were obtained. The mean gradient on the first set of pressures was 7. The gradient on the second set of pressures was 6-7. Findings are suggestive for an elevated hepatic venous pressure gradient and concerning for portal hypertension. Core biopsies obtained from the right hepatic lobe. Four adequate specimens were obtained. No significant bleeding or extravasation on the final hepatic venography. IMPRESSION: Successful transjugular liver biopsy. Specimens sent to pathology and microbiology. Elevated hepatic venous pressure gradient. Electronically Signed   By: Markus Daft M.D.   On: 01/07/2018 18:17   Ir Transcatheter Bx  Result Date:  01/07/2018 INDICATION: 42 year old female with a complex medical history including markedly abnormal liver enzymes. Request for liver biopsy. Patient has thrombocytopenia and received platelets prior to the procedure. EXAM: TRANSJUGULAR LIVER BIOPSY WITH FLUOROSCOPY HEPATIC VENOGRAPHY WITH PRESSURES ULTRASOUND GUIDANCE FOR VASCULAR ACCESS MEDICATIONS: None. ANESTHESIA/SEDATION: Moderate (conscious) sedation was employed during this procedure. A total of Versed 1 mg and Fentanyl 25 mcg was administered intravenously. Moderate Sedation Time: 41 minutes. The patient's level of consciousness and vital signs were monitored continuously by radiology nursing throughout the procedure under my direct  supervision. FLUOROSCOPY TIME:  Fluoroscopy Time: 7 minutes and 54 seconds. 628 mGy COMPLICATIONS: None immediate. PROCEDURE: Informed written consent was obtained from the patient after a thorough discussion of the procedural risks, benefits and alternatives. All questions were addressed. Maximal Sterile Barrier Technique was utilized including caps, mask, sterile gowns, sterile gloves, sterile drape, hand hygiene and skin antiseptic. A timeout was performed prior to the initiation of the procedure. Ultrasound confirmed a patent right internal jugular vein. The right side of the neck was prepped and draped in a sterile fashion. Skin was anesthetized with 1% lidocaine. 21 gauge needle directed into the right internal jugular vein with ultrasound guidance. Micropuncture dilator set was placed. Five French catheter was advanced into the IVC and a Bentson wire was placed. The tract was dilated to accommodate a 10 [place] sheath. 5 French catheter was advanced into a right hepatic vein. Hepatic venography was performed. Wedged and free hepatic vein pressures were obtained. The transjugular biopsy kit was then advanced through the sheath over a stiff Amplatz wire. Total of 5 core biopsies were performed. Four adequate core biopsies  were obtained. Three specimens placed in formalin for pathology. One specimen was placed in saline for microbiology. Follow-up venography was performed through the 5 French catheter at of the procedure. Sheath were removed with manual compression. Fluoroscopic and ultrasound images were taken and saved for documentation. FINDINGS: Two sets of hepatic venous pressures were obtained. The mean gradient on the first set of pressures was 7. The gradient on the second set of pressures was 6-7. Findings are suggestive for an elevated hepatic venous pressure gradient and concerning for portal hypertension. Core biopsies obtained from the right hepatic lobe. Four adequate specimens were obtained. No significant bleeding or extravasation on the final hepatic venography. IMPRESSION: Successful transjugular liver biopsy. Specimens sent to pathology and microbiology. Elevated hepatic venous pressure gradient. Electronically Signed   By: Markus Daft M.D.   On: 01/07/2018 18:17   Mr 3d Recon At Scanner  Result Date: 12/31/2017 CLINICAL DATA:  Abnormal liver function tests. Elevated lipase. HIV. EXAM: MRI ABDOMEN WITHOUT AND WITH CONTRAST (INCLUDING MRCP) TECHNIQUE: Multiplanar multisequence MR imaging of the abdomen was performed both before and after the administration of intravenous contrast. Heavily T2-weighted images of the biliary and pancreatic ducts were obtained, and three-dimensional MRCP images were rendered by post processing. CONTRAST:  68m MULTIHANCE GADOBENATE DIMEGLUMINE 529 MG/ML IV SOLN COMPARISON:  CT 12/30/2017 FINDINGS: Lower chest:  Lung bases are clear. Hepatobiliary: No intrahepatic duct dilatation. No focal hepatic lesion. No enhancing hepatic lesion. Postcholecystectomy. Common bile duct normal caliber. Pancreas: Mild edema associated with the pancreas. Pancreatic duct is normal caliber. No organized fluid collections. Small amount fluid along the LEFT gutter. Contrast enhanced imaging demonstrates  uniform enhancement of the pancreas Spleen: Normal spleen. Adrenals/urinary tract: Adrenal glands and kidneys are normal. Stomach/Bowel: Stomach and limited of the small bowel is unremarkable Vascular/Lymphatic: There are enlarged lymph nodes the retroperitoneum along the aorta the kidneys. For example 12 mm and 8 mm short axis lymph nodes LEFT aorta on image 67, series 12/19/2001 Musculoskeletal: No aggressive osseous lesion IMPRESSION: 1. Mild pancreatic edema associated with pancreatitis. 2. Moderate intraperitoneal free fluid. No organized fluid collections. 3. No biliary obstruction.  Postcholecystectomy. 4. Mild retroperitoneal lymphadenopathy favor HIV adenopathy. Electronically Signed   By: SSuzy BouchardM.D.   On: 12/31/2017 17:36   Dg Chest Port 1 View  Result Date: 01/02/2018 CLINICAL DATA:  Fever.  Status post EGD. EXAM: PORTABLE CHEST  1 VIEW COMPARISON:  Chest x-ray dated December 31, 2017. FINDINGS: Stable mild cardiomegaly. Mild interstitial edema. Small bilateral pleural effusions with bibasilar atelectasis. No pneumothorax. No acute osseous abnormality. IMPRESSION: New mild interstitial pulmonary edema and small bilateral pleural effusions. Electronically Signed   By: William T Derry M.D.   On: 01/02/2018 10:09   Dg Chest Port 1 View  Result Date: 12/31/2017 CLINICAL DATA:  Shortness of breath, chest congestion, and productive cough. History of HIV, nonsmoker. EXAM: PORTABLE CHEST 1 VIEW COMPARISON:  Chest x-ray and chest CT scan of October 21, 2017 FINDINGS: The lungs are well-expanded. There is no focal infiltrate. There is a rounded approximately 1.5 cm diameter structure projecting over the posterolateral aspect of the left ninth rib which may reflect a nipple shadow. No similar finding is noted on the right however. Some asymmetry of the nipples was noted on the CT scan of October 21, 2017. The apices appear clear. The heart and pulmonary vascularity are normal. The mediastinum is  normal in width. No mediastinal or hilar lymphadenopathy is observed. There is no pleural effusion. The bony thorax is unremarkable. IMPRESSION: No acute infiltrate or definite mass. Presumed prominent nipple shadow on the left. A repeat chest x-ray with nipple markers and a lateral view would be useful to more completely evaluate the thorax in this patient with cryptogenic hemoptysis. These findings were discussed by telephone with Dr. Powell. Electronically Signed   By: David  Jordan M.D.   On: 12/31/2017 13:27   Dg Esophagus Dilation  Result Date: 01/14/2018 ESOPHAGEAL DILATATION: Fluoroscopy was provided for use by the requesting physician.  No images were obtained for radiographic interpretation.  Mr Abdomen Mrcp W Wo Contast  Result Date: 12/31/2017 CLINICAL DATA:  Abnormal liver function tests. Elevated lipase. HIV. EXAM: MRI ABDOMEN WITHOUT AND WITH CONTRAST (INCLUDING MRCP) TECHNIQUE: Multiplanar multisequence MR imaging of the abdomen was performed both before and after the administration of intravenous contrast. Heavily T2-weighted images of the biliary and pancreatic ducts were obtained, and three-dimensional MRCP images were rendered by post processing. CONTRAST:  9mL MULTIHANCE GADOBENATE DIMEGLUMINE 529 MG/ML IV SOLN COMPARISON:  CT 12/30/2017 FINDINGS: Lower chest:  Lung bases are clear. Hepatobiliary: No intrahepatic duct dilatation. No focal hepatic lesion. No enhancing hepatic lesion. Postcholecystectomy. Common bile duct normal caliber. Pancreas: Mild edema associated with the pancreas. Pancreatic duct is normal caliber. No organized fluid collections. Small amount fluid along the LEFT gutter. Contrast enhanced imaging demonstrates uniform enhancement of the pancreas Spleen: Normal spleen. Adrenals/urinary tract: Adrenal glands and kidneys are normal. Stomach/Bowel: Stomach and limited of the small bowel is unremarkable Vascular/Lymphatic: There are enlarged lymph nodes the  retroperitoneum along the aorta the kidneys. For example 12 mm and 8 mm short axis lymph nodes LEFT aorta on image 67, series 12/19/2001 Musculoskeletal: No aggressive osseous lesion IMPRESSION: 1. Mild pancreatic edema associated with pancreatitis. 2. Moderate intraperitoneal free fluid. No organized fluid collections. 3. No biliary obstruction.  Postcholecystectomy. 4. Mild retroperitoneal lymphadenopathy favor HIV adenopathy. Electronically Signed   By: Stewart  Edmunds M.D.   On: 12/31/2017 17:36     LOS: 15 days   Shanker Ghimire, MD  [facility] Pager:[phone]  If 7PM-7AM, please contact night-coverage [email or web address] Password [email or web address] 01/14/2018, 2:29 PM   

## 2018-01-14 NOTE — Interval H&P Note (Signed)
History and Physical Interval Note:  01/14/2018 10:57 AM  [name]  has presented today for surgery, with the diagnosis of Dysphagia; Esophageal stricture  The various methods of treatment have been discussed with the patient and family. After consideration of risks, benefits and other options for treatment, the patient has consented to  Procedure(s): ESOPHAGOGASTRODUODENOSCOPY (EGD) WITH PROPOFOL (N/A) WITH DILATION as a surgical intervention .  The patient's history has been reviewed, patient examined, no change in status, stable for surgery.  I have reviewed the patient's chart and labs.  Questions were answered to the patient's satisfaction.     [place] C.

## 2018-01-14 NOTE — Progress Notes (Signed)
Patient is aware she is on a clear liquid diet. Visitor brought her rice and vegetables.

## 2018-01-14 NOTE — Transfer of Care (Signed)
Immediate Anesthesia Transfer of Care Note  Patient: [name]  Procedure(s) Performed: ESOPHAGOGASTRODUODENOSCOPY (EGD) WITH PROPOFOL (N/A )  Patient Location: PACU  Anesthesia Type:MAC  Level of Consciousness: sedated  Airway & Oxygen Therapy: Patient Spontanous Breathing and Patient connected to nasal cannula oxygen  Post-op Assessment: Report given to RN and Post -op Vital signs reviewed and stable  Post vital signs: Reviewed and stable  Last Vitals:  Vitals:   01/14/18 0456 01/14/18 1034  BP: 137/82 (!) 173/102  Pulse: (!) 59 (!) 57  Resp: 17 14  Temp: 36.7 C 36.6 C  SpO2: 100% 100%    Last Pain:  Vitals:   01/14/18 1034  TempSrc: Oral  PainSc: 8       Patients Stated Pain Goal: 8 (59/93/57 0177)  Complications: No apparent anesthesia complications

## 2018-01-14 NOTE — Op Note (Signed)
[facility] Patient Name: [name] Procedure Date: 01/14/2018 MRN: [id number] Attending MD: Lear Ng , MD Date of Birth: 08-01-1976 CSN: [id number] Age: 42 Admit Type: Inpatient Procedure:                Upper GI endoscopy Indications:              Dysphagia, Stricture of the esophagus Providers:                Lear Ng, MD, Cleda Daub, RN,                            William Dalton, Technician, Laurena Spies,                            Technician Referring MD:             hospital team Medicines:                Propofol per Anesthesia, Monitored Anesthesia Care Complications:            No immediate complications. Estimated Blood Loss:     Estimated blood loss was minimal. Procedure:                Pre-Anesthesia Assessment:                           - Prior to the procedure, a History and Physical                            was performed, and patient medications and                            allergies were reviewed. The patient's tolerance of                            previous anesthesia was also reviewed. The risks                            and benefits of the procedure and the sedation                            options and risks were discussed with the patient.                            All questions were answered, and informed consent                            was obtained. Prior Anticoagulants: The patient has                            taken no previous anticoagulant or antiplatelet                            agents. ASA Grade Assessment: III - A patient with  severe systemic disease. After reviewing the risks                            and benefits, the patient was deemed in                            satisfactory condition to undergo the procedure.                           After obtaining informed consent, the endoscope was                            passed under direct vision. Throughout the                           procedure, the patient's blood pressure, pulse, and                            oxygen saturations were monitored continuously. The                            EG-[id number] ([id number]) scope was introduced through the                            mouth, and advanced to the second part of duodenum.                            The upper GI endoscopy was performed with                            difficulty due to stricture. Successful completion                            of the procedure was aided by straightening and                            shortening the scope to obtain bowel loop reduction                            and performing the maneuvers documented (below) in                            this report. The patient tolerated the procedure                            well. Scope In: Scope Out: Findings:      One severe (stenosis; an endoscope cannot pass) benign-appearing,       intrinsic stenosis was found in the lower third of the esophagus. This       measured 9 mm (inner diameter) and was traversed after dilation. A       guidewire was placed under fluoroscopic guidance and the scope was       withdrawn. Dilation was performed with a Savary dilator with no  resistance at 9 mm, mild resistance at 10 mm and moderate resistance at       11 mm and 12 mm. Estimated blood loss was minimal.      The Z-line was found 40 cm from the incisors.      Patchy candidiasis was found in the lower third of the esophagus.      A small hiatal hernia was present.      The examined duodenum was normal.      Patchy mild inflammation characterized by congestion (edema) and       erythema was found in the prepyloric region of the stomach. Impression:               - Benign-appearing esophageal stenosis. Dilated.                           - Z-line, 40 cm from the incisors.                           - Monilial esophagitis.                           - Small hiatal hernia.                            - Normal examined duodenum.                           - Acute gastritis.                           - No specimens collected. Moderate Sedation:      N/A- Per Anesthesia Care Recommendation:           - Patient has a contact number available for                            emergencies. The signs and symptoms of potential                            delayed complications were discussed with the                            patient. Return to normal activities tomorrow.                            Written discharge instructions were provided to the                            patient.                           - Clear liquid diet.                           - Observe patient's clinical course.                           - Use Protonix (pantoprazole) 40 mg IV BID.                           -  Post procedure medication orders were given. Procedure Code(s):        --- Professional ---                           [phone], Esophagogastroduodenoscopy, flexible,                            transoral; with insertion of guide wire followed by                            passage of dilator(s) through esophagus over guide                            wire Diagnosis Code(s):        --- Professional ---                           K22.2, Esophageal obstruction                           R13.10, Dysphagia, unspecified                           K29.00, Acute gastritis without bleeding                           B37.81, Candidal esophagitis                           K44.9, Diaphragmatic hernia without obstruction or                            gangrene CPT copyright 2016 [employer] Association. All rights reserved. The codes documented in this report are preliminary and upon coder review may  be revised to meet current compliance requirements. Lear Ng, MD 01/14/2018 12:00:14 PM This report has been signed electronically. Number of Addenda: 0

## 2018-01-14 NOTE — Anesthesia Postprocedure Evaluation (Signed)
Anesthesia Post Note  Patient: [name]  Procedure(s) Performed: ESOPHAGOGASTRODUODENOSCOPY (EGD) WITH PROPOFOL (N/A )     Patient location during evaluation: PACU Anesthesia Type: MAC Level of consciousness: awake and alert Pain management: pain level controlled Vital Signs Assessment: post-procedure vital signs reviewed and stable Respiratory status: spontaneous breathing Cardiovascular status: stable Anesthetic complications: no    Last Vitals:  Vitals:   01/14/18 1210 01/14/18 1220  BP: 138/78   Pulse: 66 (!) 55  Resp: 16 14  Temp:    SpO2: 100% 99%    Last Pain:  Vitals:   01/14/18 1155  TempSrc: Oral  PainSc:                  Nolon Nations

## 2018-01-14 NOTE — Progress Notes (Signed)
[place] for Infectious Disease    Date of Admission:  12/29/2017     ID: [name] is a 42 y.o. 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 Spoke with pathologist for confirmation of results  Medications:  . dexamethasone  40 mg Oral Daily  . feeding supplement  1 Container Oral TID BM  . folic acid  2 mg Oral Daily  . insulin aspart  0-9 Units Subcutaneous TID WC  . iopamidol  30 mL Oral Once  . multivitamin with minerals  1 tablet Oral Daily  . pantoprazole  40 mg Oral BID    Objective: Vital signs in last 24 hours: Temp:  [97.6 F (36.4 C)-98.4 F (36.9 C)] 98 F (36.7 C) (01/30 0456) Pulse Rate:  [59-73] 59 (01/30 0456) Resp:  [17] 17 (01/30 0456) BP: (134-140)/(82-94) 137/82 (01/30 0456) SpO2:  [100 %] 100 % (01/30 0456) Weight:  [162 lb 4.1 oz (73.6 kg)] 162 lb 4.1 oz (73.6 kg) (01/30 0456)  Physical Exam  Constitutional:  oriented to person, place, and time. appears well-developed and well-nourished. She is under numerous blankets. No distress.  HENT: [place]/AT, PERRLA, no scleral icterus Mouth/Throat: Oropharynx is clear and moist. No oropharyngeal exudate.  Cardiovascular: Normal rate, regular rhythm and normal heart sounds. Exam reveals no gallop and no friction rub.  No murmur heard.  Pulmonary/Chest: Effort normal and breath sounds normal. No respiratory distress.  has no wheezes.  Neck = supple, no nuchal rigidity Abdominal: Soft. Bowel sounds are normal.  exhibits no distension.  There is no tenderness.  Lymphadenopathy: no cervical adenopathy. No axillary adenopathy Neurological: alert and oriented to person, place, and time.  Skin: Skin is warm and dry. No rash noted. No erythema.  Psychiatric: a normal mood and affect.  behavior is normal.    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 Based on literature, removing offending drug should work and would improve. I would lean toward not giving her medications since she has been very sensitive to drugs and side effects. She denies any pruritis  As an outpatient, may reach out to liver clinic for further management  eso dilatation = underwent dilatation, appears to tolerate food  ITP = recommend to taper high dose steroids and see if platelets tolerate it. If not, she will need other  intervention of nplate or ivig. She was on significantly high doses of steroids, and may need slow taper once at smaller doses.  hiv disease= holding off on biktarvy. Based on upon literature, there have been < 10 case reports on VBDS and HIV ART induced liver injury. I suspect it is due to NRRTI, tenofovir and emtricitabine. We will hold of giving ART for the next few days t osee if we notice a decrease in her tbili. Plan to switch her to tivicay plus prezcobix (DRV-cobi) or kaletra  eso candidiasis = it does not look like fluconazole is associated with VBDS.  Will continue on anidulafungin while she is here and then convert over to fluconazole 200mg  daily  Pancreatitis = resolved  oi prophylaxis = will restart dapsone but unsure if azithromycin was associated with her transient bradycardia. May consider giving azithromycin 500mg  x 1 to see if similar transient brady  Anasarca = likely from malnutrition and 3rd spacing from fluids given during admission. Also possibly retaining fluids due to steroids Defer to primary team for management.  Malnutrition = would continue with protein supplementation          [facility] Cell: [phone] Pager: [phone]  01/14/2018, 9:57 AM

## 2018-01-15 ENCOUNTER — Encounter (HOSPITAL_COMMUNITY): Payer: Self-pay | Admitting: Gastroenterology

## 2018-01-15 DIAGNOSIS — D6959 Other secondary thrombocytopenia: Secondary | ICD-10-CM

## 2018-01-15 DIAGNOSIS — B179 Acute viral hepatitis, unspecified: Secondary | ICD-10-CM

## 2018-01-15 LAB — CBC WITH DIFFERENTIAL/PLATELET
BASOS ABS: 0 10*3/uL (ref 0.0–0.1)
Basophils Relative: 0 %
EOS PCT: 0 %
Eosinophils Absolute: 0 10*3/uL (ref 0.0–0.7)
HEMATOCRIT: 35.4 % — AB (ref 36.0–46.0)
Hemoglobin: 12.7 g/dL (ref 12.0–15.0)
LYMPHS ABS: 0.3 10*3/uL — AB (ref 0.7–4.0)
Lymphocytes Relative: 10 %
MCH: 29.2 pg (ref 26.0–34.0)
MCHC: 35.9 g/dL (ref 30.0–36.0)
MCV: 81.4 fL (ref 78.0–100.0)
MONO ABS: 0.1 10*3/uL (ref 0.1–1.0)
Monocytes Relative: 3 %
NEUTROS ABS: 2.3 10*3/uL (ref 1.7–7.7)
Neutrophils Relative %: 87 %
Platelets: 59 10*3/uL — ABNORMAL LOW (ref 150–400)
RBC: 4.35 MIL/uL (ref 3.87–5.11)
RDW: 25 % — AB (ref 11.5–15.5)
WBC: 2.7 10*3/uL — AB (ref 4.0–10.5)

## 2018-01-15 LAB — PROTIME-INR
INR: 1.01
Prothrombin Time: 13.2 seconds (ref 11.4–15.2)

## 2018-01-15 LAB — HEPATIC FUNCTION PANEL
ALK PHOS: 448 U/L — AB (ref 38–126)
ALT: 91 U/L — AB (ref 14–54)
AST: 138 U/L — ABNORMAL HIGH (ref 15–41)
Albumin: 1.7 g/dL — ABNORMAL LOW (ref 3.5–5.0)
BILIRUBIN INDIRECT: 5.5 mg/dL — AB (ref 0.3–0.9)
BILIRUBIN TOTAL: 17 mg/dL — AB (ref 0.3–1.2)
Bilirubin, Direct: 11.5 mg/dL — ABNORMAL HIGH (ref 0.1–0.5)
Total Protein: 4.6 g/dL — ABNORMAL LOW (ref 6.5–8.1)

## 2018-01-15 LAB — BASIC METABOLIC PANEL
Anion gap: 9 (ref 5–15)
BUN: 8 mg/dL (ref 6–20)
CALCIUM: 7.7 mg/dL — AB (ref 8.9–10.3)
CHLORIDE: 94 mmol/L — AB (ref 101–111)
CO2: 22 mmol/L (ref 22–32)
GLUCOSE: 98 mg/dL (ref 65–99)
Potassium: 3.3 mmol/L — ABNORMAL LOW (ref 3.5–5.1)
Sodium: 125 mmol/L — ABNORMAL LOW (ref 135–145)

## 2018-01-15 LAB — GLUCOSE, CAPILLARY
GLUCOSE-CAPILLARY: 223 mg/dL — AB (ref 65–99)
GLUCOSE-CAPILLARY: 283 mg/dL — AB (ref 65–99)
Glucose-Capillary: 93 mg/dL (ref 65–99)
Glucose-Capillary: 102 mg/dL — ABNORMAL HIGH (ref 65–99)

## 2018-01-15 LAB — AEROBIC/ANAEROBIC CULTURE W GRAM STAIN (SURGICAL/DEEP WOUND)

## 2018-01-15 LAB — EPSTEIN BARR VRS(EBV DNA BY PCR)
EBV DNA QN BY PCR: 110 {copies}/mL
LOG10 EBV DNA QN PCR: 2.041 {Log_copies}/mL

## 2018-01-15 LAB — AEROBIC/ANAEROBIC CULTURE (SURGICAL/DEEP WOUND)

## 2018-01-15 MED ORDER — DEXAMETHASONE 4 MG PO TABS
4.0000 mg | ORAL_TABLET | Freq: Every day | ORAL | Status: AC
  Administered 2018-01-19: 4 mg via ORAL
  Filled 2018-01-15: qty 1

## 2018-01-15 MED ORDER — DEXAMETHASONE 4 MG PO TABS
2.0000 mg | ORAL_TABLET | Freq: Every day | ORAL | Status: AC
  Administered 2018-01-20: 2 mg via ORAL
  Filled 2018-01-15: qty 1

## 2018-01-15 MED ORDER — DEXAMETHASONE 4 MG PO TABS
6.0000 mg | ORAL_TABLET | Freq: Every day | ORAL | Status: AC
  Administered 2018-01-18: 6 mg via ORAL
  Filled 2018-01-15: qty 2

## 2018-01-15 MED ORDER — DEXAMETHASONE 0.5 MG PO TABS: 0.5000 mg | ORAL_TABLET | Freq: Every day | ORAL | Status: DC

## 2018-01-15 MED ORDER — DEXAMETHASONE 4 MG PO TABS
10.0000 mg | ORAL_TABLET | Freq: Every day | ORAL | Status: AC
  Administered 2018-01-17: 10 mg via ORAL
  Filled 2018-01-15: qty 3

## 2018-01-15 MED ORDER — IBUPROFEN 200 MG PO TABS
400.0000 mg | ORAL_TABLET | Freq: Once | ORAL | Status: AC
  Administered 2018-01-15: 400 mg via ORAL
  Filled 2018-01-15: qty 2

## 2018-01-15 MED ORDER — DEXAMETHASONE 0.5 MG PO TABS: 1.0000 mg | ORAL_TABLET | Freq: Every day | ORAL | Status: DC

## 2018-01-15 MED ORDER — DEXAMETHASONE 6 MG PO TABS
15.0000 mg | ORAL_TABLET | Freq: Every day | ORAL | Status: AC
  Administered 2018-01-16: 08:00:00 15 mg via ORAL
  Filled 2018-01-15: qty 2

## 2018-01-15 NOTE — Progress Notes (Signed)
[facility] Progress Note  [name] 41 y.o. 1976-07-24   Subjective: Sitting in bedside chair. Tolerating clear liquids and wants regular diet. Denies abdominal pain, nausea, or vomiting. Reports eating Asian rice yesterday that a friend brought her without difficulty.  Objective: Vital signs: Vitals:   01/14/18 2113 01/15/18 0659  BP: 137/83 137/76  Pulse: (!) 105 85  Resp: 20 20  Temp: 99.6 F (37.6 C) (!) 101.8 F (38.8 C)  SpO2: 98% 100%    Physical Exam: Gen: alert, no acute distress, pleasant  HEENT: +scleral icterus CV: RRR Chest: CTA B Abd: epigastric tenderness with guarding, soft, nondistended, +BS Ext: 2-3+ pitting LE edema  Lab Results: Recent Labs    01/13/18 0821 01/14/18 0624 01/15/18 0641  NA 128* 134* 125*  K 3.2* 3.2* 3.3*  CL 103 107 94*  CO2 18* 22 22  GLUCOSE 207* 74 98  BUN 10 8 8   CREATININE 0.32* 0.33* <0.30*  CALCIUM 7.5* 7.9* 7.7*  MG 1.4*  --   --    Recent Labs    01/14/18 0624 01/15/18 0641  AST 111* 138*  ALT 82* 91*  ALKPHOS 448* 448*  BILITOT 10.7* 17.0*  PROT 4.7* 4.6*  ALBUMIN 1.8* 1.7*   Recent Labs    01/14/18 0624 01/15/18 0641  WBC 2.8* 2.7*  NEUTROABS 2.5 2.3  HGB 11.9* 12.7  HCT 34.1* 35.4*  MCV 80.8 81.4  PLT 65* 59*      Assessment/Plan: 41 yo with HIV with recent acute pancreatitis and hyperbilirubinemia and liver biopsy showing 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 Bilirubin up to 17 today. Options limited to stopping the offending meds (likely the HAART meds) which is not a long-term option vs use of Ursodiol that might help but not definite. ID holding HIV meds for now to see if that will improve her cholestasis.   S/P dilation of a high-grade distal esophageal stricture and tolerating POs. Will need repeat dilation in 3-4 weeks to dilate to a larger diameter. Continue IV antifungal med (Eraxis) for Candida esophagitis. Soft diet today and if tolerates then  advance further as tolerated. Continue PPI IV Q 12 hours. Will follow.   [place] C. 01/15/2018, 1:36 PM  Pager [phone]  AFTER 5 PM or on weekends please call [phone] ID: [name], female   DOB: 08-10-1976, 42 y.o.   MRN: [id number]

## 2018-01-15 NOTE — Progress Notes (Signed)
PROGRESS NOTE        PATIENT DETAILS Name: [name] Age: 42 y.o. Sex: female Date of Birth: May 18, 1976 Admit Date: 12/29/2017 Admitting Physician Norval Morton, MD ZOX:[id number], Christean Grief, MD  Brief Narrative: Patient is a 42 y.o. female with history of HIV who presented to the hospital with worsening abdominal pain-felt to be secondary to acute pancreatitis.  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  She slowly continues to improve-see below for further details  Subjective: Febrile this morning-continues to have significant swelling in her lower extremities.    Assessment/Plan: Acute pancreatitis: Unclear etiology-patient is status post cholecystectomy-no history of alcohol use.  MRCP of the abdomen negative for biliary obstruction/pathology.  Pancreatitis seems to have resolved with supportive care.  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  We initially contemplated starting ursodiol-however ID wanted [place] to hold for now.  Decadron was tapered down yesterday-not sure if this has anything to do with worsening liver function. Will await further recommendations from ID and GI service.  Fever: Occurred 1/31-repeat blood cultures-could be related to worsening hepatitis.  Await further input from ID- monitor off antimicrobial therapy for now  Esophageal stricture: Underwent EGD and dilatation on 1/30-advance diet.  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    Hyponatremia: I suspect this is still secondary to hypovolemia-she has significant edema in the lower extremities.  We will go ahead and stop Aldactone and discontinue Lasix.  Repeat electrolytes tomorrow.  She is currently asymptomatic.  Massive volume overload: Secondary to acute illness/hepatitis and severe hypoalbuminemia-stopping Aldactone today-continue Lasix.  UA was negative for proteinuria.  Follow weights/intake output and electrolytes closely.   Esophageal candidiasis: Appears to be stable-continue anidulafungin-we will await further recommendations from ID  HIV: poorly controlled-last CD4 count of 8-antiretrovirals on hold due to ongoing hepatitis.  ID plans on restarting an alternative antiretroviral agent  Steroid-induced hyperglycemia: Suspect will continue to improve as steroids are being tapered down-had hypoglycemic episode this morning.  Leukopenia/mild anemia: Likely secondary to underlying HIV.  Continue to follow.  Hypokalemia: Repleted-continue to follow closely while on diuretics.  Sinus bradycardia: Continue telemetry monitoring-avoid rate limiting agents  DVT Prophylaxis: SCD's  Code Status: Full code   Family Communication: None at bedside  Disposition Plan: Remain inpatient-spoke with infectious disease-recommendations are to discharge early next week-most likely on Monday.  Antimicrobial agents: Anti-infectives (From admission, onward)   Start     Dose/Rate Route Frequency Ordered Stop   01/07/18 2000  bictegravir-emtricitabine-tenofovir AF (BIKTARVY) 50-200-25 MG per tablet 1 tablet  Status:  Discontinued     1 tablet Oral Daily 01/07/18 1449 01/13/18 1356   01/07/18 1000  ethambutol (MYAMBUTOL) tablet 850 mg  Status:  Discontinued     15 mg/kg  55.2 kg Oral Daily 01/06/18 1739 01/08/18 1420   01/06/18 1800  azithromycin (ZITHROMAX) 500 mg in dextrose 5 %  250 mL  IVPB  Status:  Discontinued     500 mg 250 mL/hr over 60 Minutes Intravenous Every 24 hours 01/06/18 1737 01/08/18 1420   01/06/18 1200  bictegravir-emtricitabine-tenofovir AF (BIKTARVY) 50-200-25 MG per tablet 1 tablet  Status:  Discontinued     1 tablet Oral Daily 01/05/18 1626 01/06/18 1741   01/05/18 1500  anidulafungin (ERAXIS) 200 mg in sodium chloride 0.9 % 200 mL IVPB     200 mg 78 mL/hr over 200 Minutes Intravenous Every 24 hours 01/05/18 1331     01/02/18 2200  vancomycin (VANCOCIN) 500 mg in sodium chloride 0.9 % 100 mL IVPB  Status:  Discontinued     500 mg 100 mL/hr over 60 Minutes Intravenous Every 12 hours 01/02/18 1111 01/02/18 1559   01/02/18 1400  ceFEPIme (MAXIPIME) 1 g in dextrose 5 % 50 mL IVPB  Status:  Discontinued     1 g 100 mL/hr over 30 Minutes Intravenous Every 8 hours 01/02/18 1111 01/02/18 1559   01/02/18 1200  vancomycin (VANCOCIN) IVPB 1000 mg/200 mL premix     1,000 mg 200 mL/hr over 60 Minutes Intravenous  Once 01/02/18 1111 01/02/18 1344   12/31/17 2000  dapsone tablet 100 mg  Status:  Discontinued     100 mg Oral Daily 12/31/17 1859 01/03/18 1619   12/31/17 1000  fluconazole (DIFLUCAN) IVPB 100 mg  Status:  Discontinued     100 mg 50 mL/hr over 60 Minutes Intravenous Every 24 hours 12/30/17 1001 12/30/17 1431   12/31/17 1000  fluconazole (DIFLUCAN) IVPB 400 mg  Status:  Discontinued     400 mg 100 mL/hr over 120 Minutes Intravenous Every 24 hours 12/30/17 1431 01/03/18 1111   12/30/17 1100  fluconazole (DIFLUCAN) IVPB 200 mg     200 mg 100 mL/hr over 60 Minutes Intravenous  Once 12/30/17 1001 12/30/17 1220   12/30/17 1000  bictegravir-emtricitabine-tenofovir AF (BIKTARVY) 50-200-25 MG per tablet 1 tablet  Status:  Discontinued     1 tablet Oral Daily 12/30/17 0933 01/05/18 1544      Procedures: 1/30>> EGD 1/23>> liver biopsy  CONSULTS:  ID, GI and hematology/oncology  Time spent: 25-minutes-Greater than 50% of this time was spent in  counseling, explanation of diagnosis, planning of further management, and coordination of care.  MEDICATIONS: Scheduled Meds: . [START ON 01/16/2018] dexamethasone  15 mg Oral Q breakfast   Followed by  . [START ON 01/17/2018] dexamethasone  10 mg Oral Q breakfast   Followed by  . [START ON 01/18/2018] dexamethasone  6 mg Oral Q breakfast   Followed by  . [START ON 01/19/2018] dexamethasone  4 mg Oral Q breakfast   Followed by  . [START ON 01/20/2018] dexamethasone  2 mg Oral Q breakfast   Followed by  . [START ON 01/21/2018] dexamethasone  1 mg Oral Q breakfast   Followed by  . [START ON 01/22/2018] dexamethasone  0.5 mg Oral Q breakfast  . feeding supplement  1 Container Oral TID BM  . folic acid  2 mg Oral Daily  . furosemide  40 mg Intravenous Daily  . insulin aspart  0-9 Units Subcutaneous TID WC  . iopamidol  30 mL Oral Once  . multivitamin with minerals  1 tablet Oral Daily  . pantoprazole (PROTONIX) IV  40 mg Intravenous Q12H   Continuous Infusions: . anidulafungin 200 mg (01/14/18 1552)   PRN Meds:.albuterol, hydrALAZINE, hyoscyamine, ondansetron **OR** ondansetron (ZOFRAN) IV, oxyCODONE   PHYSICAL EXAM: Vital signs: Vitals:  01/14/18 1414 01/14/18 2112 01/14/18 2113 01/15/18 0659  BP: (!) 170/84  137/83 137/76  Pulse: 62  (!) 105 85  Resp: 15  20 20   Temp: 98.7 F (37.1 C) 100.2 F (37.9 C) 99.6 F (37.6 C) (!) 101.8 F (38.8 C)  TempSrc: Oral Oral Oral Oral  SpO2: 100%  98% 100%  Weight:      Height:       Filed Weights   01/11/18 0600 01/12/18 0631 01/14/18 0456  Weight: 67.3 kg (148 lb 5.9 oz) 68.2 kg (150 lb 5.7 oz) 73.6 kg (162 lb 4.1 oz)   Body mass index is 21.41 kg/m.   General appearance :Awake, alert, not in any distress.  Eyes:, pupils equally reactive to light and accomodation,++scleral icterus. HEENT: Atraumatic and Normocephalic Neck: supple, no JVD. Resp:Good air entry bilaterally CVS: S1 S2 regular, no murmurs.  GI: Bowel sounds present, Non  tender and not distended with no gaurding, rigidity or rebound. Extremities: B/L Lower Ext shows +++ edema, both legs are warm to touch Neurology:  speech clear,Non focal, sensation is grossly intact. Psychiatric: Normal judgment and insight. Normal mood. Musculoskeletal:No digital cyanosis Skin:No Rash, warm and dry Wounds:N/A  I have personally reviewed following labs and imaging studies  LABORATORY DATA: CBC: Recent Labs  Lab 01/11/18 0620 01/12/18 0601 01/13/18 0821 01/14/18 0624 01/15/18 0641  WBC 2.9* 4.3 2.9* 2.8* 2.7*  NEUTROABS 2.7 3.9 2.8 2.5 2.3  HGB 13.4 13.7 11.8* 11.9* 12.7  HCT 37.4 38.4 32.9* 34.1* 35.4*  MCV 78.1 80.0 79.7 80.8 81.4  PLT 79* 71* 57* 65* 59*    Basic Metabolic Panel: Recent Labs  Lab 01/08/18 1828 01/09/18 9371  01/11/18 0620 01/12/18 0601 01/13/18 0821 01/14/18 0624 01/15/18 0641  NA  --  135   < > 127* 132* 128* 134* 125*  K  --  3.1*   < > 3.8 3.8 3.2* 3.2* 3.3*  CL  --  106   < > 104 107 103 107 94*  CO2  --  22   < > 15* 19* 18* 22 22  GLUCOSE  --  191*   < > 257* 154* 207* 74 98  BUN  --  10   < > 11 11 10 8 8   CREATININE  --  <0.30*   < > 0.31* <0.30* 0.32* 0.33* <0.30*  CALCIUM  --  7.7*   < > 7.6* 7.9* 7.5* 7.9* 7.7*  MG 2.4 1.9  --   --   --  1.4*  --   --    < > = values in this interval not displayed.    GFR: CrCl cannot be calculated (This lab value cannot be used to calculate CrCl because it is not a number: <0.30).  Liver Function Tests: Recent Labs  Lab 01/11/18 0620 01/12/18 0601 01/13/18 0821 01/14/18 0624 01/15/18 0641  AST 122* 125* 114* 111* 138*  ALT 63* 76* 79* 82* 91*  ALKPHOS 501* 537* 465* 448* 448*  BILITOT 11.8* 11.4* 10.9* 10.7* 17.0*  PROT 5.1* 4.8* 4.9* 4.7* 4.6*  ALBUMIN 1.8* 1.9* 1.8* 1.8* 1.7*   Recent Labs  Lab 01/12/18 0601  LIPASE 42   No results for input(s): AMMONIA in the last 168 hours.  Coagulation Profile: Recent Labs  Lab 01/11/18 0620 01/12/18 0601 01/13/18 0821  01/14/18 0624 01/15/18 0641  INR 0.99 0.94 0.91 0.94 1.01    Cardiac Enzymes: No results for input(s): CKTOTAL, CKMB, CKMBINDEX, TROPONINI in the last 168 hours.  BNP (last 3 results) No results for input(s): PROBNP in the last 8760 hours.  HbA1C: No results for input(s): HGBA1C in the last 72 hours.  CBG: Recent Labs  Lab 01/14/18 1106 01/14/18 1735 01/14/18 2108 01/15/18 0746 01/15/18 1141  GLUCAP 86 75 136* 93 102*    Lipid Profile: No results for input(s): CHOL, HDL, LDLCALC, TRIG, CHOLHDL, LDLDIRECT in the last 72 hours.  Thyroid Function Tests: No results for input(s): TSH, T4TOTAL, FREET4, T3FREE, THYROIDAB in the last 72 hours.  Anemia Panel: No results for input(s): VITAMINB12, FOLATE, FERRITIN, TIBC, IRON, RETICCTPCT in the last 72 hours.  Urine analysis:    Component Value Date/Time   COLORURINE AMBER (A) 01/06/2018 1702   APPEARANCEUR HAZY (A) 01/06/2018 1702   LABSPEC 1.017 01/06/2018 1702   PHURINE 6.0 01/06/2018 1702   GLUCOSEU >=500 (A) 01/06/2018 1702   HGBUR SMALL (A) 01/06/2018 1702   BILIRUBINUR MODERATE (A) 01/06/2018 1702   KETONESUR NEGATIVE 01/06/2018 1702   PROTEINUR NEGATIVE 01/06/2018 1702   UROBILINOGEN 0.2 12/07/2014 1441   NITRITE NEGATIVE 01/06/2018 1702   LEUKOCYTESUR NEGATIVE 01/06/2018 1702    Sepsis Labs: Lactic Acid, Venous    Component Value Date/Time   LATICACIDVEN 2.4 (HH) 01/07/2018 0431    MICROBIOLOGY: Recent Results (from the past 240 hour(s))  MRSA PCR Screening     Status: None   Collection Time: 01/06/18  6:59 PM  Result Value Ref Range Status   MRSA by PCR NEGATIVE NEGATIVE Final    Comment:        The GeneXpert MRSA Assay (FDA approved for NASAL specimens only), is one component of a comprehensive MRSA colonization surveillance program. It is not intended to diagnose MRSA infection nor to guide or monitor treatment for MRSA infections.   Aerobic/Anaerobic Culture (surgical/deep wound)      Status: None (Preliminary result)   Collection Time: 01/07/18  5:59 PM  Result Value Ref Range Status   Specimen Description LIVER BIOPSY  Final   Special Requests NONE  Final   Gram Stain   Final    MODERATE WBC PRESENT, PREDOMINANTLY MONONUCLEAR NO ORGANISMS SEEN    Culture   Final    HOLDING FOR POSSIBLE ANAEROBE CRITICAL RESULT CALLED TO, READ BACK BY AND VERIFIED WITH: D SCOTT,RN AT 1540 01/12/18 BY L BENFIELD CONCERNING GROWTH ON CULTURE Performed at [facility], [place] [address] [place], [place] [address]    Report Status PENDING  Incomplete  Acid Fast Smear (AFB)     Status: None   Collection Time: 01/07/18  5:59 PM  Result Value Ref Range Status   AFB Specimen Processing Concentration  Final   Acid Fast Smear Negative  Final    Comment: (NOTE) Performed At: [facility] [place], [place] [id number] Rush Farmer MD [id number]    Source (AFB) LIVER  Final    Comment: BIOPSY  Fungus Culture With Stain     Status: None (Preliminary result)   Collection Time: 01/07/18  5:59 PM  Result Value Ref Range Status   Fungus Stain Final report  Final    Comment: (NOTE) Performed At: [facility] [place], [place] [id number] Rush Farmer MD YB:[id number]    Fungus (Mycology) Culture PENDING  Incomplete   Fungal Source LIVER  Final    Comment: BIOPSY  Fungus Culture Result     Status: None   Collection Time: 01/07/18  5:59 PM  Result Value Ref Range Status   Result 1  Comment  Final    Comment: (NOTE) KOH/Calcofluor preparation:  no fungus observed. Performed At: [facility] [place], [place] [id number] Rush Farmer MD [id number]     RADIOLOGY STUDIES/RESULTS: Dg Chest 2 View  Result Date: 12/31/2017 CLINICAL DATA:  Nipple marker request EXAM: CHEST  2 VIEW COMPARISON:  1100 hours FINDINGS: Left basilar nodular density corresponds to the nipple marker. Allowing for nipple shadows, lungs  are clear other than minimal subsegmental atelectasis for scarring at the left base. Lungs are clear. No pneumothorax or pleural effusion. Mild cardiomegaly. Normal vascularity. IMPRESSION: No active cardiopulmonary disease. Electronically Signed   By: Marybelle Killings M.D.   On: 12/31/2017 19:13   Ct Head Wo Contrast  Result Date: 01/05/2018 CLINICAL DATA:  42 year old female with history of altered level of consciousness. Fall this morning. Possible seizure. EXAM: CT HEAD WITHOUT CONTRAST TECHNIQUE: Contiguous axial images were obtained from the base of the skull through the vertex without intravenous contrast. COMPARISON:  Head CT 03/10/2012. FINDINGS: Brain: No evidence of acute infarction, hemorrhage, hydrocephalus, extra-axial collection or mass lesion/mass effect. Vascular: No hyperdense vessel or unexpected calcification. Skull: Normal. Negative for fracture or focal lesion. Sinuses/Orbits: No acute finding. Other: None. IMPRESSION: 1. No acute intracranial abnormalities. The appearance of the brain is normal. Electronically Signed   By: Vinnie Langton M.D.   On: 01/05/2018 11:01   Ct Abdomen Pelvis W Contrast  Result Date: 01/06/2018 CLINICAL DATA:  Abdominal distension with elevated lactate EXAM: CT ABDOMEN AND PELVIS WITH CONTRAST TECHNIQUE: Multidetector CT imaging of the abdomen and pelvis was performed using the standard protocol following bolus administration of intravenous contrast. CONTRAST:  138m ISOVUE-300 IOPAMIDOL (ISOVUE-300) INJECTION 61% COMPARISON:  MRI 12/31/2017, CT abdomen pelvis 12/30/2017, 10/23/2017, 10/21/2017, 10/13/2017, 10/16/2016 FINDINGS: Lower chest: Lung bases demonstrate moderate pleural effusions, increased compared to prior. Partial consolidation in the lower lobes, atelectasis versus pneumonia. Mild cardiomegaly. Mild distal esophageal and GE junction thickening. Hepatobiliary: No focal hepatic abnormality. Surgical clips at the gallbladder fossa. No biliary  dilatation Pancreas: Decreased pancreatic edema.  No ductal dilatation Spleen: Interval enlargement of the spleen now measuring 14 cm on coronal views. Adrenals/Urinary Tract: Adrenal glands are unremarkable. Kidneys are normal, without renal calculi, focal lesion, or hydronephrosis. Bladder is unremarkable. Stomach/Bowel: Stomach is unremarkable. Fluid-filled loops of small bowel within the abdomen. No significant wall thickening. No intramural air. Normal appendix. No colon wall thickening. Vascular/Lymphatic: Nonaneurysmal aorta. Patent portal vein. Non enhancement of the splenic vein consistent with occlusion, new since the comparison CT. Enlarged retroperitoneal lymph nodes as before. Reproductive: Uterus and bilateral adnexa are unremarkable. Other: Large volume of ascites, increased compared to prior. No free air. Musculoskeletal: Degenerative changes. No acute or suspicious abnormality. IMPRESSION: 1. Moderate bilateral pleural effusions, increased compared to prior. Partial consolidations in the bilateral lower lobes may reflect atelectasis or pneumonia 2. 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 Portal vein remains patent. Interval enlargement of the spleen. 3. Decreased pancreatic edema. Large volume of ascites in the abdomen and pelvis, increased compared to prior. 4. Fluid-filled loops of central small bowel, possible ileus. No intramural air or portal venous gas. 5. Stable retroperitoneal lymphadenopathy Electronically Signed   By: KDonavan FoilM.D.   On: 01/06/2018 23:28   Ct Abdomen Pelvis W Contrast  Result Date: 12/30/2017 CLINICAL DATA:  Generalized abdominal pain, vomiting and hematuria. HIV positive. Elevated lipase. Prior cholecystectomy. EXAM: CT ABDOMEN AND PELVIS WITH CONTRAST TECHNIQUE: Multidetector CT  imaging of the abdomen and pelvis was performed using the standard protocol following bolus administration of intravenous  contrast. CONTRAST:  80 cc Isovue-300 IV. COMPARISON:  10/21/2017 CT abdomen/pelvis. FINDINGS: Lower chest: No significant pulmonary nodules or acute consolidative airspace disease. Hepatobiliary: Normal liver size. No liver mass. Cholecystectomy. No biliary ductal dilatation. Common bile duct diameter 5 mm. Pancreas: There is acute diffuse pancreatic enlargement with prominent pancreatic and peripancreatic edema with ill-defined peripancreatic fluid extending into the bilateral anterior paranephric retroperitoneal spaces and into the lesser sac, compatible with acute pancreatitis. No regions of pancreatic parenchymal nonenhancement or gas. No pancreatic mass or duct dilation. No measurable peripancreatic fluid collections. Spleen: Normal size. No mass. Adrenals/Urinary Tract: Normal adrenals. Normal kidneys with no hydronephrosis and no renal mass. Normal bladder. Stomach/Bowel: Normal non-distended stomach. Normal caliber small bowel with no small bowel wall thickening. Appendix. Normal large bowel with no diverticulosis, large bowel wall thickening or pericolonic fat stranding. Vascular/Lymphatic: Normal caliber abdominal aorta. Patent portal, splenic, hepatic and renal veins. Stable mildly enlarged 1.0 cm right retrocrural node (series 2/image 15). Stable mildly enlarged 1.2 cm porta hepatis node (series 2/image 22). Stable mild left para-aortic adenopathy measuring up to 1.4 cm (series 2/image 31). No new pathologically enlarged abdominopelvic nodes. Reproductive: Grossly normal uterus.  No adnexal mass. Other: Small volume ascites. No pneumoperitoneum. No focal fluid collection. Musculoskeletal: No aggressive appearing focal osseous lesions. IMPRESSION: 1. Acute pancreatitis diffusely involving the pancreas, without complication. 2. Small volume ascites. 3. Chronic porta hepatis and retroperitoneal adenopathy is stable and probably reactive. Electronically Signed   By: Ilona Sorrel M.D.   On: 12/30/2017 02:45     Ir Venogram Hepatic W Hemodynamic Evaluation  Result Date: 01/07/2018 INDICATION: 42 year old female with a complex medical history including markedly abnormal liver enzymes. Request for liver biopsy. Patient has thrombocytopenia and received platelets prior to the procedure. EXAM: TRANSJUGULAR LIVER BIOPSY WITH FLUOROSCOPY HEPATIC VENOGRAPHY WITH PRESSURES ULTRASOUND GUIDANCE FOR VASCULAR ACCESS MEDICATIONS: None. ANESTHESIA/SEDATION: Moderate (conscious) sedation was employed during this procedure. A total of Versed 1 mg and Fentanyl 25 mcg was administered intravenously. Moderate Sedation Time: 41 minutes. The patient's level of consciousness and vital signs were monitored continuously by radiology nursing throughout the procedure under my direct supervision. FLUOROSCOPY TIME:  Fluoroscopy Time: 7 minutes and 54 seconds. 709 mGy COMPLICATIONS: None immediate. PROCEDURE: Informed written consent was obtained from the patient after a thorough discussion of the procedural risks, benefits and alternatives. All questions were addressed. Maximal Sterile Barrier Technique was utilized including caps, mask, sterile gowns, sterile gloves, sterile drape, hand hygiene and skin antiseptic. A timeout was performed prior to the initiation of the procedure. Ultrasound confirmed a patent right internal jugular vein. The right side of the neck was prepped and draped in a sterile fashion. Skin was anesthetized with 1% lidocaine. 21 gauge needle directed into the right internal jugular vein with ultrasound guidance. Micropuncture dilator set was placed. Five French catheter was advanced into the IVC and a Bentson wire was placed. The tract was dilated to accommodate a 10 [place] sheath. 5 French catheter was advanced into a right hepatic vein. Hepatic venography was performed. Wedged and free hepatic vein pressures were obtained. The transjugular biopsy kit was then advanced through the sheath over a stiff Amplatz wire. Total  of 5 core biopsies were performed. Four adequate core biopsies were obtained. Three specimens placed in formalin for pathology. One specimen was placed in saline for microbiology. Follow-up venography was performed through the 5 [place]  catheter at of the procedure. Sheath were removed with manual compression. Fluoroscopic and ultrasound images were taken and saved for documentation. FINDINGS: Two sets of hepatic venous pressures were obtained. The mean gradient on the first set of pressures was 7. The gradient on the second set of pressures was 6-7. Findings are suggestive for an elevated hepatic venous pressure gradient and concerning for portal hypertension. Core biopsies obtained from the right hepatic lobe. Four adequate specimens were obtained. No significant bleeding or extravasation on the final hepatic venography. IMPRESSION: Successful transjugular liver biopsy. Specimens sent to pathology and microbiology. Elevated hepatic venous pressure gradient. Electronically Signed   By: Markus Daft M.D.   On: 01/07/2018 18:17   Ir Transcatheter Bx  Result Date: 01/07/2018 INDICATION: 42 year old female with a complex medical history including markedly abnormal liver enzymes. Request for liver biopsy. Patient has thrombocytopenia and received platelets prior to the procedure. EXAM: TRANSJUGULAR LIVER BIOPSY WITH FLUOROSCOPY HEPATIC VENOGRAPHY WITH PRESSURES ULTRASOUND GUIDANCE FOR VASCULAR ACCESS MEDICATIONS: None. ANESTHESIA/SEDATION: Moderate (conscious) sedation was employed during this procedure. A total of Versed 1 mg and Fentanyl 25 mcg was administered intravenously. Moderate Sedation Time: 41 minutes. The patient's level of consciousness and vital signs were monitored continuously by radiology nursing throughout the procedure under my direct supervision. FLUOROSCOPY TIME:  Fluoroscopy Time: 7 minutes and 54 seconds. 161 mGy COMPLICATIONS: None immediate. PROCEDURE: Informed written consent was obtained from  the patient after a thorough discussion of the procedural risks, benefits and alternatives. All questions were addressed. Maximal Sterile Barrier Technique was utilized including caps, mask, sterile gowns, sterile gloves, sterile drape, hand hygiene and skin antiseptic. A timeout was performed prior to the initiation of the procedure. Ultrasound confirmed a patent right internal jugular vein. The right side of the neck was prepped and draped in a sterile fashion. Skin was anesthetized with 1% lidocaine. 21 gauge needle directed into the right internal jugular vein with ultrasound guidance. Micropuncture dilator set was placed. Five French catheter was advanced into the IVC and a Bentson wire was placed. The tract was dilated to accommodate a 10 [place] sheath. 5 French catheter was advanced into a right hepatic vein. Hepatic venography was performed. Wedged and free hepatic vein pressures were obtained. The transjugular biopsy kit was then advanced through the sheath over a stiff Amplatz wire. Total of 5 core biopsies were performed. Four adequate core biopsies were obtained. Three specimens placed in formalin for pathology. One specimen was placed in saline for microbiology. Follow-up venography was performed through the 5 French catheter at of the procedure. Sheath were removed with manual compression. Fluoroscopic and ultrasound images were taken and saved for documentation. FINDINGS: Two sets of hepatic venous pressures were obtained. The mean gradient on the first set of pressures was 7. The gradient on the second set of pressures was 6-7. Findings are suggestive for an elevated hepatic venous pressure gradient and concerning for portal hypertension. Core biopsies obtained from the right hepatic lobe. Four adequate specimens were obtained. No significant bleeding or extravasation on the final hepatic venography. IMPRESSION: Successful transjugular liver biopsy. Specimens sent to pathology and microbiology.  Elevated hepatic venous pressure gradient. Electronically Signed   By: Markus Daft M.D.   On: 01/07/2018 18:17   Mr 3d Recon At Scanner  Result Date: 12/31/2017 CLINICAL DATA:  Abnormal liver function tests. Elevated lipase. HIV. EXAM: MRI ABDOMEN WITHOUT AND WITH CONTRAST (INCLUDING MRCP) TECHNIQUE: Multiplanar multisequence MR imaging of the abdomen was performed both before and after the administration  of intravenous contrast. Heavily T2-weighted images of the biliary and pancreatic ducts were obtained, and three-dimensional MRCP images were rendered by post processing. CONTRAST:  78m MULTIHANCE GADOBENATE DIMEGLUMINE 529 MG/ML IV SOLN COMPARISON:  CT 12/30/2017 FINDINGS: Lower chest:  Lung bases are clear. Hepatobiliary: No intrahepatic duct dilatation. No focal hepatic lesion. No enhancing hepatic lesion. Postcholecystectomy. Common bile duct normal caliber. Pancreas: Mild edema associated with the pancreas. Pancreatic duct is normal caliber. No organized fluid collections. Small amount fluid along the LEFT gutter. Contrast enhanced imaging demonstrates uniform enhancement of the pancreas Spleen: Normal spleen. Adrenals/urinary tract: Adrenal glands and kidneys are normal. Stomach/Bowel: Stomach and limited of the small bowel is unremarkable Vascular/Lymphatic: There are enlarged lymph nodes the retroperitoneum along the aorta the kidneys. For example 12 mm and 8 mm short axis lymph nodes LEFT aorta on image 67, series 12/19/2001 Musculoskeletal: No aggressive osseous lesion IMPRESSION: 1. Mild pancreatic edema associated with pancreatitis. 2. Moderate intraperitoneal free fluid. No organized fluid collections. 3. No biliary obstruction.  Postcholecystectomy. 4. Mild retroperitoneal lymphadenopathy favor HIV adenopathy. Electronically Signed   By: SSuzy BouchardM.D.   On: 12/31/2017 17:36   Dg Chest Port 1 View  Result Date: 01/02/2018 CLINICAL DATA:  Fever.  Status post EGD. EXAM: PORTABLE CHEST 1  VIEW COMPARISON:  Chest x-ray dated December 31, 2017. FINDINGS: Stable mild cardiomegaly. Mild interstitial edema. Small bilateral pleural effusions with bibasilar atelectasis. No pneumothorax. No acute osseous abnormality. IMPRESSION: New mild interstitial pulmonary edema and small bilateral pleural effusions. Electronically Signed   By: WTitus DubinM.D.   On: 01/02/2018 10:09   Dg Chest Port 1 View  Result Date: 12/31/2017 CLINICAL DATA:  Shortness of breath, chest congestion, and productive cough. History of HIV, nonsmoker. EXAM: PORTABLE CHEST 1 VIEW COMPARISON:  Chest x-ray and chest CT scan of October 21, 2017 FINDINGS: The lungs are well-expanded. There is no focal infiltrate. There is a rounded approximately 1.5 cm diameter structure projecting over the posterolateral aspect of the left ninth rib which may reflect a nipple shadow. No similar finding is noted on the right however. Some asymmetry of the nipples was noted on the CT scan of October 21, 2017. The apices appear clear. The heart and pulmonary vascularity are normal. The mediastinum is normal in width. No mediastinal or hilar lymphadenopathy is observed. There is no pleural effusion. The bony thorax is unremarkable. IMPRESSION: No acute infiltrate or definite mass. Presumed prominent nipple shadow on the left. A repeat chest x-ray with nipple markers and a lateral view would be useful to more completely evaluate the thorax in this patient with cryptogenic hemoptysis. These findings were discussed by telephone with Dr. PFlorene Glen Electronically Signed   By: David  JMartiniqueM.D.   On: 12/31/2017 13:27   Dg Esophagus Dilation  Result Date: 01/14/2018 ESOPHAGEAL DILATATION: Fluoroscopy was provided for use by the requesting physician.  No images were obtained for radiographic interpretation.  Mr Abdomen Mrcp WMoise BoringContast  Result Date: 12/31/2017 CLINICAL DATA:  Abnormal liver function tests. Elevated lipase. HIV. EXAM: MRI ABDOMEN WITHOUT AND  WITH CONTRAST (INCLUDING MRCP) TECHNIQUE: Multiplanar multisequence MR imaging of the abdomen was performed both before and after the administration of intravenous contrast. Heavily T2-weighted images of the biliary and pancreatic ducts were obtained, and three-dimensional MRCP images were rendered by post processing. CONTRAST:  978mMULTIHANCE GADOBENATE DIMEGLUMINE 529 MG/ML IV SOLN COMPARISON:  CT 12/30/2017 FINDINGS: Lower chest:  Lung bases are clear. Hepatobiliary: No intrahepatic duct dilatation. No  focal hepatic lesion. No enhancing hepatic lesion. Postcholecystectomy. Common bile duct normal caliber. Pancreas: Mild edema associated with the pancreas. Pancreatic duct is normal caliber. No organized fluid collections. Small amount fluid along the LEFT gutter. Contrast enhanced imaging demonstrates uniform enhancement of the pancreas Spleen: Normal spleen. Adrenals/urinary tract: Adrenal glands and kidneys are normal. Stomach/Bowel: Stomach and limited of the small bowel is unremarkable Vascular/Lymphatic: There are enlarged lymph nodes the retroperitoneum along the aorta the kidneys. For example 12 mm and 8 mm short axis lymph nodes LEFT aorta on image 67, series 12/19/2001 Musculoskeletal: No aggressive osseous lesion IMPRESSION: 1. Mild pancreatic edema associated with pancreatitis. 2. Moderate intraperitoneal free fluid. No organized fluid collections. 3. No biliary obstruction.  Postcholecystectomy. 4. Mild retroperitoneal lymphadenopathy favor HIV adenopathy. Electronically Signed   By: Suzy Bouchard M.D.   On: 12/31/2017 17:36     LOS: 16 days   Oren Binet, MD  [facility] Pager:[phone]  If 7PM-7AM, please contact night-coverage [email or web address] Password [email or web address] 01/15/2018, 12:00 PM

## 2018-01-15 NOTE — Progress Notes (Signed)
[facility] for Infectious Disease    Date of Admission:  12/29/2017   Total days of antibiotics 17        Day 9 anidulafungin           ID: [name] is a 42 y.o. 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 Isolated temp. Diuresed 6L yesterday. Sitting up in chair- having soft foods with meals without difficulty  EGD still showed evidence of candidiasis in distal eso  --labs showing tbili increased from 10 to 17 ,but INR WNL  Medications:  . [START ON 01/16/2018] dexamethasone  15 mg Oral Q breakfast   Followed by  . [START ON 01/17/2018] dexamethasone  10 mg Oral Q breakfast   Followed by  . [START ON 01/18/2018] dexamethasone  6 mg Oral Q breakfast   Followed by  . [START ON 01/19/2018] dexamethasone  4 mg Oral Q breakfast   Followed by  . [START ON 01/20/2018] dexamethasone  2 mg Oral Q breakfast   Followed by  . [START ON 01/21/2018] dexamethasone  1 mg Oral Q breakfast   Followed by  . [START ON 01/22/2018] dexamethasone  0.5 mg Oral Q breakfast  . feeding supplement  1 Container Oral TID BM  . folic acid  2 mg Oral Daily  . furosemide  40 mg Intravenous Daily  .  insulin aspart  0-9 Units Subcutaneous TID WC  . iopamidol  30 mL Oral Once  . multivitamin with minerals  1 tablet Oral Daily  . pantoprazole (PROTONIX) IV  40 mg Intravenous Q12H    Objective: Vital signs in last 24 hours: Temp:  [98.6 F (37 C)-101.8 F (38.8 C)] 98.6 F (37 C) (01/31 1415) Pulse Rate:  [80-105] 80 (01/31 1415) Resp:  [10-20] 10 (01/31 1415) BP: (126-137)/(76-83) 126/80 (01/31 1415) SpO2:  [98 %-100 %] 100 % (01/31 1415) Physical Exam  Constitutional:  oriented to person, place, and time. appears jaundiced with scleral icterus, appears tired, chronically ill. No distress.  HENT: [place]/AT, PERRLA, + scleral icterus Mouth/Throat: Oropharynx is clear and moist. No oropharyngeal exudate.  Cardiovascular: Normal rate, regular rhythm and normal heart sounds. Exam reveals no gallop and no friction rub.  No murmur heard.  Pulmonary/Chest: Effort normal and breath sounds normal. No respiratory distress.  has no wheezes.  Neck = supple, no nuchal rigidity Abdominal: Soft. Bowel sounds are normal. +tenderness and mild guardin Ext: +2 edema to legs up thighs Neurological: alert and oriented to person, place, and time.  Skin: Skin is warm and dry. No rash noted. No erythema.  Psychiatric: a normal mood and affect.  behavior is normal.     Lab Results Recent  Labs    01/14/18 0624 01/15/18 0641  WBC 2.8* 2.7*  HGB 11.9* 12.7  HCT 34.1* 35.4*  NA 134* 125*  K 3.2* 3.3*  CL 107 94*  CO2 22 22  BUN 8 8  CREATININE 0.33* <0.30*   Liver Panel Recent Labs    01/14/18 0624 01/15/18 0641  PROT 4.7* 4.6*  ALBUMIN 1.8* 1.7*  AST 111* 138*  ALT 82* 91*  ALKPHOS 448* 448*  BILITOT 10.7* 17.0*  BILIDIR 6.1* 11.5*  IBILI 4.6* 5.5*    Microbiology: 1/23 liver bx ngtd 1/28 ebv vl 110 1/29 HHV-6 pending 1/16 cmv VL unable to QTY  Studies/Results: Dg Esophagus Dilation  Result Date: 01/14/2018 ESOPHAGEAL DILATATION: Fluoroscopy was provided for use by the  requesting physician.  No images were obtained for radiographic interpretation.  Liver, needle/core biopsy, random; transjugular - VANISHING BILE DUCT SYNDROME (VBDS) WITH ASSOCIATED CHOLESTASIS - CHANGES SUGGESTIVE OF POST-INFANTILE GIANT CELL HEPATITIS [facility]). - MILD CENTRILOBULAR HEPATOCYTE DROPOUT WITH FIBROSIS Diagnosis Note The biopsy is adequate for review. Hepatic lobules show prominent canalicular cholestasis which, in some areas, is quite pronounced with associated cholestatic rosettes. Patchy giant cell transformation of the hepatocytes is also noted. There is mild to moderate mixed lobular inflammation with a few microgranulomas but lobular microabscesses are not identified. Mild centrilobular hepatocyte dropout with associated fibrosis is present. Steatosis is minimal without evidence of steatohepatitis. Portal tracts show a marked loss of bile ducts with only minimal to mild inflammation. CK7 immunostain confirms loss of about 85% bile ducts in the submitted biopsy and highlights cholestatic hepatocytes. The ductular reaction is minimal to mild. Trichrome stain highlights the perivenular (centrilobular) fibrosis and mild sinusoidal fibrosis. Iron stain is negative for stainable iron. PAS-D stain does not show evidence of alpha-1 antitrypsin accumulation. reticulin stain shows somewhat distorted hepatic plate architecture with hepatocyte rosettes. 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 Vanishing bile duct syndrome has been described both in association with HAART medications as well as HIV infection itself. Giant cell hepatitis in the adult population (post-infantile: [facility]) is relatively uncommon and differential diagnosis mainly includes drug/toxin-induced liver injury, viral infections (including HIV), and autoimmune diseases. Taken together, the etiology for this patient's liver disease mainly  includes medication effect (e.g. HAART) and direct effect of HIV infection. 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 She is s/p chole in Nov 2018, but still had progressive weight loss due to dysphagia and abdominal discomfort (likely eso candidiasis plus eso stricture). Admitted on 1/15 pancreatitis with lipase 1300, alp 944, AST 161/ALT 37, tbili 14.7. Lipase resolved in 5 days but cholestatic hepatitis did not. 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   Interestingly on her last hospital admission on day of discharge, 10/23/17-- [id number], AST 119, ALT52, TB 3.8 (essentially 3x than 3 days prior but not addressed at that time)  The main thought is that her  DILI/cholestatic hepatitis with VBDS is due to her hiv regimen of bictegravir/tenofovir alfanamide/emtricitabine. The case reports of the 6 pts with HIV and VBDS were in older HIV ART drugs but does include tenofovir.  We have stopped her HIV med recently to see if we can see improvement, but today it is worsened  There are many drugs associated with VBDS -- mostly common antibiotics but also include PPI.  May need to consider discontinuing other drugs if not improving from the hepatitis and hyperbilirubinemia.  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 Not convinced that ursodiol would help  based on the literature but if she worsens, would consider using.  Last week, she had a unexplained increase of her tbili and it decreased spontaneously, which I would continue to follow  If she clinically worsens or hepatic function worsens - would have a low threshold to transfer to academic center such as [place] or [facility]  Fever = she is clinically stable. Would not start abtx empirically at this time. Will follow blood cx?   HIV disease  = poorly controlled but holding ART due to DILI, VBDS. If to resume, would choose tivicay plus darunavir-cobi  OI proph = she had previously been on dapsone but we stopped due to risk of contributing to drug induced hepatitis. Can consider to restart in a few days if her liver tests are improved. 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 Still may need nplate or ivig if it worsens  Anasarca = from malnutrition and getting IVF. Getting gentle diuresis though had 6L UOP yesterday which would account for her hyponatremia. Would stop and watch to see if she has good UOP>2L today.continue to follow sodium and potassium/mag repletion  Dr Megan Salon to see tomorrow.   [facility] for Infectious Diseases Cell: [phone] Pager: [phone]  01/15/2018, 5:07 PM

## 2018-01-15 NOTE — Progress Notes (Signed)
It seems like things might be going back in the other direction.  She had the biopsy come back from her liver.  She has vanishing bile duct syndrome.  She has giant cell hepatitis.  There is no obvious malignancy (i.e. lymphoma) in her biopsy which I am happy about.  Her CBC shows that her white cell count is trending downward.  Her white cell count today is 2.7.  Her hemoglobin is 12.7.  Her platelet count is also going downward at 59,000.  She is off her HIV medications.  This might be why her counts are trending downward.  Her bilirubin is going back up.  Her bilirubin is now 17.  Her SGPT and SGOT are also trending upward.  Her sodium is only 125.  Her potassium is 3.3.  Her chloride is 94.  If I find that her platelet count continues to trend downward, I might consider her for IVIG.  I think this would be reasonable.  She responded to the steroids.  As such, she should respond to IVIG.  She had a temperature spike this morning of 101.8.  Infectious Disease is looking after her ID issues.  She, hopefully, will begin to eat a little bit better.  On her exam, I really cannot find anything that was different.  I cannot palpate a spleen.  She had no adenopathy.  I just feel bad for her.  I realize that she is in a tough situation by not taking her HIV medications for a while.  We had another good prayer session this morning.  She does have a very strong faith.  [name], MD

## 2018-01-15 NOTE — Progress Notes (Signed)
Lab called stated that they had hard time getting enough blood for lab this AM. Lab asked RN to page MD to see if MD would consider a line, since Lab stated they barely got enough blood for labs this AM and that they would not be able to get enough blood for the blood cultures. RN paged MD. Waiting for MD to call back at this time.

## 2018-01-16 ENCOUNTER — Encounter (HOSPITAL_COMMUNITY): Payer: Self-pay

## 2018-01-16 DIAGNOSIS — B20 Human immunodeficiency virus [HIV] disease: Secondary | ICD-10-CM

## 2018-01-16 DIAGNOSIS — K828 Other specified diseases of gallbladder: Secondary | ICD-10-CM

## 2018-01-16 DIAGNOSIS — Z882 Allergy status to sulfonamides status: Secondary | ICD-10-CM

## 2018-01-16 DIAGNOSIS — K838 Other specified diseases of biliary tract: Secondary | ICD-10-CM

## 2018-01-16 DIAGNOSIS — Z91010 Allergy to peanuts: Secondary | ICD-10-CM

## 2018-01-16 DIAGNOSIS — L8990 Pressure ulcer of unspecified site, unspecified stage: Secondary | ICD-10-CM

## 2018-01-16 DIAGNOSIS — Z881 Allergy status to other antibiotic agents status: Secondary | ICD-10-CM

## 2018-01-16 DIAGNOSIS — Z8719 Personal history of other diseases of the digestive system: Secondary | ICD-10-CM

## 2018-01-16 DIAGNOSIS — Z91018 Allergy to other foods: Secondary | ICD-10-CM

## 2018-01-16 DIAGNOSIS — M316 Other giant cell arteritis: Secondary | ICD-10-CM

## 2018-01-16 LAB — HEPATIC FUNCTION PANEL
ALK PHOS: 424 U/L — AB (ref 38–126)
ALT: 95 U/L — AB (ref 14–54)
AST: 130 U/L — AB (ref 15–41)
Albumin: 2 g/dL — ABNORMAL LOW (ref 3.5–5.0)
BILIRUBIN TOTAL: 18 mg/dL — AB (ref 0.3–1.2)
Bilirubin, Direct: 13.1 mg/dL — ABNORMAL HIGH (ref 0.1–0.5)
Indirect Bilirubin: 7.4 mg/dL — ABNORMAL HIGH (ref 0.3–0.9)
Total Protein: 5.1 g/dL — ABNORMAL LOW (ref 6.5–8.1)

## 2018-01-16 LAB — CBC
HEMATOCRIT: 29.3 % — AB (ref 36.0–46.0)
HEMOGLOBIN: 10.8 g/dL — AB (ref 12.0–15.0)
MCH: 28.9 pg (ref 26.0–34.0)
MCHC: 36.9 g/dL — ABNORMAL HIGH (ref 30.0–36.0)
MCV: 78.3 fL (ref 78.0–100.0)
PLATELETS: 43 10*3/uL — AB (ref 150–400)
RBC: 3.74 MIL/uL — AB (ref 3.87–5.11)
RDW: 25 % — ABNORMAL HIGH (ref 11.5–15.5)
WBC: 3.9 10*3/uL — ABNORMAL LOW (ref 4.0–10.5)

## 2018-01-16 LAB — BASIC METABOLIC PANEL
Anion gap: 10 (ref 5–15)
BUN: 9 mg/dL (ref 6–20)
CO2: 25 mmol/L (ref 22–32)
Calcium: 8.3 mg/dL — ABNORMAL LOW (ref 8.9–10.3)
Chloride: 96 mmol/L — ABNORMAL LOW (ref 101–111)
Glucose, Bld: 180 mg/dL — ABNORMAL HIGH (ref 65–99)
Potassium: 2.9 mmol/L — ABNORMAL LOW (ref 3.5–5.1)
Sodium: 131 mmol/L — ABNORMAL LOW (ref 135–145)

## 2018-01-16 LAB — PROTIME-INR
INR: 1.03
Prothrombin Time: 13.4 seconds (ref 11.4–15.2)

## 2018-01-16 LAB — GLUCOSE, CAPILLARY
GLUCOSE-CAPILLARY: 123 mg/dL — AB (ref 65–99)
GLUCOSE-CAPILLARY: 124 mg/dL — AB (ref 65–99)
GLUCOSE-CAPILLARY: 174 mg/dL — AB (ref 65–99)
Glucose-Capillary: 143 mg/dL — ABNORMAL HIGH (ref 65–99)

## 2018-01-16 LAB — MISC LABCORP TEST (SEND OUT): Labcorp test code: 139310

## 2018-01-16 MED ORDER — MAGIC MOUTHWASH
10.0000 mL | Freq: Four times a day (QID) | ORAL | Status: DC | PRN
  Filled 2018-01-16: qty 10

## 2018-01-16 MED ORDER — IMMUNE GLOBULIN (HUMAN) 10 GM/100ML IV SOLN
400.0000 mg/kg | INTRAVENOUS | Status: DC
  Administered 2018-01-16 – 2018-01-19 (×4): 30 g via INTRAVENOUS
  Filled 2018-01-16 (×2): qty 100
  Filled 2018-01-16 (×2): qty 200
  Filled 2018-01-16: qty 300

## 2018-01-16 NOTE — Progress Notes (Signed)
[facility] Progress Note  [name] 42 y.o. 06-30-1976    Subjective: Tolerating soft foods. Complaining of mouth ulcers on her buccal mucosa that burns with eating. Denies abdominal pain. LE edema improved. Feels like her right ear is clogged. Sitting in bedside chair.  Objective: Vital signs: Vitals:   01/16/18 1101 01/16/18 1149  BP: (!) 149/96 (!) 135/92  Pulse:    Resp: 18 16  Temp: 97.7 F (36.5 C) (!) 97.5 F (36.4 C)  SpO2: 100% 100%  P 70  Physical Exam: Gen: alert, no acute distress, thin HEENT: +scleral icterus CV: RRR Chest: CTA B Abd: epigastric tenderness with guarding, soft, nondistended, +BS Ext: no edema  Lab Results: Recent Labs    01/15/18 0641 01/16/18 0603  NA 125* 131*  K 3.3* 2.9*  CL 94* 96*  CO2 22 25  GLUCOSE 98 180*  BUN 8 9  CREATININE <0.30* <0.30*  CALCIUM 7.7* 8.3*   Recent Labs    01/15/18 0641 01/16/18 0603  AST 138* 130*  ALT 91* 95*  ALKPHOS 448* 424*  BILITOT 17.0* 18.0*  PROT 4.6* 5.1*  ALBUMIN 1.7* 2.0*   Recent Labs    01/14/18 0624 01/15/18 0641 01/16/18 0603  WBC 2.8* 2.7* 3.9*  NEUTROABS 2.5 2.3  --   HGB 11.9* 12.7 10.8*  HCT 34.1* 35.4* 29.3*  MCV 80.8 81.4 78.3  PLT 65* 59* 43*      Assessment/Plan: 42 yo with recent acute pancreatitis and drug-induced liver injury with cholestasis (liver biopsy showing vanishing bile duct syndrome with 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 Bilirubin rising further despite holding potentially offending meds. If TB continues to rise then would start Ursodiol to see if that will help.   Tolerating soft diet following esophageal dilation of high-grade esophageal stricture. Would repeat dilation in 2-3 weeks if stable. Keep on soft diet due to mouth ulcers. Magic mouthwash temporarily relieving mouth ulcer pain.   Continue antifungal meds for now but may need to consider stopping all potentially hepatotoxic meds if bilirubin continues to rise. ALP elevated  but stable. Reports right ear feeling clogged and defer to primary team for ear wax medicine if that continues but informed nurse as well. Dr. Penelope Coop will f/u from GI this weekend. Continue supportive care.    Franklin C. 01/16/2018, 1:04 PM  Pager [phone]  AFTER 5 PM or on weekends please call [phone] ID: [name], female   DOB: [birth year], 42 y.o.   MRN: [id number]

## 2018-01-16 NOTE — Progress Notes (Signed)
Patient ID: [name], female   DOB: 08-Jan-1976, 42 y.o.   MRN: [id number]          [facility]  Date of Admission:  12/29/2017           Day 12 anidulafungin        Day 4 off of antiretroviral therapy ASSESSMENT: When she was admitted 2 weeks ago she had acute pancreatitis that resolved while she was still on Biktarvy.  She began having elevations of her in April of last year.  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  The cause of this remains unclear.  Although it is possible that it is related to her [name] it appears that the process may have been gone well before she started [place] and I am not sure she has taken and off over the past several months to really cause this autoimmune reaction.  Her bilirubin is up slightly today.  I will not make any changes now but will consider restarting antiretroviral therapy early next week.  She does seem more committed to trying to take antiretroviral therapy than she has been in the past.  She has had no more fever and blood cultures done yesterday remain negative.  PLAN: 1. Observe on current therapy 2. Please call Dr. Lita [name] [phone]) for any infectious disease questions this weekend  Principal Problem:   Biliary dyskinesia Active Problems:   HIV disease ([facility])   Bipolar disorder ([place])   Hypokalemia   Esophageal stricture   GERD (gastroesophageal reflux disease)   Candidal esophagitis ([facility])   Severe protein-calorie malnutrition (HCC)   Pancytopenia (H[facility])   Pancreatitis   Lactic acidosis   Sinus bradycardia   Pressure injury of skin   Scheduled Meds: . [START ON 01/17/2018] dexamethasone  10 mg Oral Q breakfast   Followed by  . [START ON 01/18/2018] dexamethasone  6 mg Oral Q breakfast   Followed by  . [START ON 01/19/2018] dexamethasone  4 mg Oral Q breakfast   Followed by  .  [START ON 01/20/2018] dexamethasone  2 mg Oral Q breakfast   Followed by  . [START ON 01/21/2018] dexamethasone  1 mg Oral Q breakfast   Followed by  . [START ON 01/22/2018] dexamethasone  0.5 mg Oral Q breakfast  . feeding supplement  1 Container Oral TID BM  . folic acid  2 mg Oral Daily  . furosemide  40 mg Intravenous Daily  . insulin aspart  0-9 Units Subcutaneous TID WC  . iopamidol  30 mL Oral Once  . multivitamin with minerals  1 tablet Oral Daily  . pantoprazole (PROTONIX) IV  40 mg Intravenous Q12H   Continuous Infusions: . anidulafungin 200 mg (01/15/18 1600)  . IMMUNE GLOBULIN 10% (HUMAN) IV - For Fluid Restriction Only     PRN Meds:.albuterol, hydrALAZINE, hyoscyamine, magic mouthwash, ondansetron **OR** ondansetron (ZOFRAN) IV, oxyCODONE   SUBJECTIVE: [name] is feeling a little better today.  She is still having some pain with swallowing but has meals.  She is able to tell me that she was started on Biktarvy last fall when she was hospitalized she says that she took it for about 6 days after going home then stopped it when she started having problems with nausea and vomiting again.  She had been taking it with fluconazole each evening.  She only restarted it about  3 days before this admission.  She says that she is worried about being off of her HIV medication now.  She says that she is confused and does not understand what is going on with her liver.  She recently disclosed her HIV status to her oldest son.  Up until that time she had never told anyone other than her care providers about her status.  Her son has not spoken to her since that time.  She tells me that he said that he knew something was wrong with her.  She was very tearful when telling me this.  Review of Systems: Review of Systems  Constitutional: Negative for chills, diaphoresis and fever.  HENT:       Dysphagia.  Respiratory: Negative for cough.   Cardiovascular: Negative for chest pain.  Gastrointestinal:  Negative for abdominal pain, nausea and vomiting.  Neurological: Negative for headaches.    Allergies  Allergen Reactions  . Other Hives and Itching    Berries, TREE NUTS  . Peanut-Containing Drug Products Hives  . Shellfish Allergy Hives  . Bactrim Itching  . Orange Fruit Itching  . Sulfa Antibiotics Itching    OBJECTIVE: Vitals:   01/15/18 0659 01/15/18 1415 01/16/18 0552 01/16/18 1101  BP: 137/76 126/80 136/80 (!) 149/96  Pulse: 85 80 70   Resp: 20 10 12 18   Temp: (!) 101.8 F (38.8 C) 98.6 F (37 C) (!) 97.4 F (36.3 C) 97.7 F (36.5 C)  TempSrc: Oral Oral Oral Oral  SpO2: 100% 100% 99% 100%  Weight:      Height:       Body mass index is 21.41 kg/m.  Physical Exam  Constitutional:  She seems to be in very good spirits.  She was in the bathroom for about 1 hour and a door.    Lab Results Lab Results  Component Value Date   WBC 3.9 (L) 01/16/2018   HGB 10.8 (L) 01/16/2018   HCT 29.3 (L) 01/16/2018   MCV 78.3 01/16/2018   PLT 43 (L) 01/16/2018    Lab Results  Component Value Date   CREATININE <0.30 (L) 01/16/2018   BUN 9 01/16/2018   NA 131 (L) 01/16/2018   K 2.9 (L) 01/16/2018   CL 96 (L) 01/16/2018   CO2 25 01/16/2018    Lab Results  Component Value Date   ALT 95 (H) 01/16/2018   AST 130 (H) 01/16/2018   ALKPHOS 424 (H) 01/16/2018   BILITOT 18.0 (H) 01/16/2018     Microbiology: Recent Results (from the past 240 hour(s))  MRSA PCR Screening     Status: None   Collection Time: 01/06/18  6:59 PM  Result Value Ref Range Status   MRSA by PCR NEGATIVE NEGATIVE Final    Comment:        The GeneXpert MRSA Assay (FDA approved for NASAL specimens only), is one component of a comprehensive MRSA colonization surveillance program. It is not intended to diagnose MRSA infection nor to guide or monitor treatment for MRSA infections.   Aerobic/Anaerobic Culture (surgical/deep wound)     Status: None   Collection Time: 01/07/18  5:59 PM  Result  Value Ref Range Status   Specimen Description LIVER BIOPSY  Final   Special Requests NONE  Final   Gram Stain   Final    MODERATE WBC PRESENT, PREDOMINANTLY MONONUCLEAR NO ORGANISMS SEEN    Culture   Final    RARE PROPIONIBACTERIUM ACNES CRITICAL RESULT CALLED TO, READ BACK BY AND VERIFIED  WITH: D SCOTT,RN AT 8325 01/12/18 BY L BENFIELD CONCERNING GROWTH ON CULTURE Performed at [facility], [place] [address], [place], [place] [address]    Report Status 01/15/2018 FINAL  Final  Acid Fast Smear (AFB)     Status: None   Collection Time: 01/07/18  5:59 PM  Result Value Ref Range Status   AFB Specimen Processing Concentration  Final   Acid Fast Smear Negative  Final    Comment: (NOTE) Performed At: [facility] [place], [place] [id number] Rush Farmer MD [id number]    Source (AFB) LIVER  Final    Comment: BIOPSY  Fungus Culture With Stain     Status: None (Preliminary result)   Collection Time: 01/07/18  5:59 PM  Result Value Ref Range Status   Fungus Stain Final report  Final    Comment: (NOTE) Performed At: [facility] [place], [place] [id number] Rush Farmer MD [id number]    Fungus (Mycology) Culture PENDING  Incomplete   Fungal Source LIVER  Final    Comment: BIOPSY  Fungus Culture Result     Status: None   Collection Time: 01/07/18  5:59 PM  Result Value Ref Range Status   Result 1 Comment  Final    Comment: (NOTE) KOH/Calcofluor preparation:  no fungus observed. Performed At: [facility] [address] [place], [place] [id number] Rush Farmer MD [id number]     Michel Bickers, [place] for [place] [phone] pager   [phone] cell 01/16/2018, 11:44 AM

## 2018-01-16 NOTE — Progress Notes (Signed)
PROGRESS NOTE        PATIENT DETAILS Name: [name] Age: 42 y.o. Sex: female Date of Birth: [birth year] Admit Date: 12/29/2017 Admitting Physician Norval Morton, MD RFF:[id number], Christean Grief, MD  Brief Narrative: Patient is a 42 y.o. female with history of HIV who presented to the hospital with worsening abdominal pain-felt to be secondary to acute pancreatitis.  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  She slowly continues to improve-see below for further details  Subjective: Afebrile for the past 24 hours-decrease swelling in the lower extremities.  .    Assessment/Plan: Acute pancreatitis: Unclear etiology-patient is status post cholecystectomy-no history of alcohol use.  MRCP of the abdomen negative for biliary obstruction/pathology.  Pancreatitis seems to have resolved with supportive care.  Tolerating advancement in diet  Acute hepatitis: Liver biopsy on 1/23 showed giant cell hepatitis with vanishing Dr. Marin Comment to be secondary to antiretrovirals.  LFTs continued to worsen-INR stable-she is not encephalopathic.  Initially contemplated starting ursodiol-however infectious disease wanted to hold off-if LFTs continue to worsen-we will discuss starting ursodiol with infectious disease.  Will await further recommendations from ID and GI service.  Fever: Occurred 1/31-could be related to worsening hepatitis.  Blood cultures on 1/31- so far.  Continue to monitor off antimicrobial therapy-ID following.    Esophageal stricture: Underwent EGD and dilatation on 1/30-tolerating advancement in diet.  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 Delford Field is recommending starting IVIG.  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  Hyponatremia: Secondary to hypervolemia-although edema has improved she still remains volume overloaded-continue Lasix.  Sodium levels have slowly started to improve.   Massive volume overload: Secondary to acute illness/hepatitis and severe hypoalbuminemia-continue Lasix.  UA was negative for proteinuria.  I have asked RN to check weights.  Follow weight/intake/output daily.    Esophageal candidiasis: Appears to be stable-continue anidulafungin-we will await further recommendations from ID  HIV: poorly controlled-last CD4 count of 8-antiretrovirals on hold due to ongoing hepatitis.  ID plans on restarting an alternative antiretroviral agent over the next few days  Steroid-induced hyperglycemia: Suspect will continue to improve as steroids are being tapered down-had hypoglycemic episode this morning.  Leukopenia/mild anemia: Likely secondary to underlying HIV.  Continue to follow.  Hypokalemia: Secondary to Lasix-we will replete and recheck.   Sinus bradycardia: Continue telemetry monitoring-avoid rate limiting agents  DVT Prophylaxis: SCD's  Code Status: Full code   Family Communication: None at bedside  Disposition Plan: Remain inpatient-we will require several more days of hospitalization before discharge  Antimicrobial agents: Anti-infectives (From admission, onward)   Start     Dose/Rate Route Frequency Ordered Stop   01/07/18 2000  bictegravir-emtricitabine-tenofovir AF (BIKTARVY) 50-200-25 MG per tablet 1 tablet  Status:  Discontinued     1 tablet Oral Daily 01/07/18 1449 01/13/18 1356   01/07/18 1000  ethambutol (MYAMBUTOL) tablet 850 mg  Status:  Discontinued     15 mg/kg  55.2 kg Oral Daily 01/06/18 1739 01/08/18 1420   01/06/18 1800  azithromycin (ZITHROMAX) 500 mg in dextrose 5 % 250  mL IVPB  Status:  Discontinued     500  mg 250 mL/hr over 60 Minutes Intravenous Every 24 hours 01/06/18 1737 01/08/18 1420   01/06/18 1200  bictegravir-emtricitabine-tenofovir AF (BIKTARVY) 50-200-25 MG per tablet 1 tablet  Status:  Discontinued     1 tablet Oral Daily 01/05/18 1626 01/06/18 1741   01/05/18 1500  anidulafungin (ERAXIS) 200 mg in sodium chloride 0.9 % 200 mL IVPB     200 mg 78 mL/hr over 200 Minutes Intravenous Every 24 hours 01/05/18 1331     01/02/18 2200  vancomycin (VANCOCIN) 500 mg in sodium chloride 0.9 % 100 mL IVPB  Status:  Discontinued     500 mg 100 mL/hr over 60 Minutes Intravenous Every 12 hours 01/02/18 1111 01/02/18 1559   01/02/18 1400  ceFEPIme (MAXIPIME) 1 g in dextrose 5 % 50 mL IVPB  Status:  Discontinued     1 g 100 mL/hr over 30 Minutes Intravenous Every 8 hours 01/02/18 1111 01/02/18 1559   01/02/18 1200  vancomycin (VANCOCIN) IVPB 1000 mg/200 mL premix     1,000 mg 200 mL/hr over 60 Minutes Intravenous  Once 01/02/18 1111 01/02/18 1344   12/31/17 2000  dapsone tablet 100 mg  Status:  Discontinued     100 mg Oral Daily 12/31/17 1859 01/03/18 1619   12/31/17 1000  fluconazole (DIFLUCAN) IVPB 100 mg  Status:  Discontinued     100 mg 50 mL/hr over 60 Minutes Intravenous Every 24 hours 12/30/17 1001 12/30/17 1431   12/31/17 1000  fluconazole (DIFLUCAN) IVPB 400 mg  Status:  Discontinued     400 mg 100 mL/hr over 120 Minutes Intravenous Every 24 hours 12/30/17 1431 01/03/18 1111   12/30/17 1100  fluconazole (DIFLUCAN) IVPB 200 mg     200 mg 100 mL/hr over 60 Minutes Intravenous  Once 12/30/17 1001 12/30/17 1220   12/30/17 1000  bictegravir-emtricitabine-tenofovir AF (BIKTARVY) 50-200-25 MG per tablet 1 tablet  Status:  Discontinued     1 tablet Oral Daily 12/30/17 0933 01/05/18 1544      Procedures: 1/30>> EGD 1/23>> liver biopsy  CONSULTS:  ID, GI and hematology/oncology  Time spent: 25-minutes-Greater than 50% of this time was spent in counseling, explanation of diagnosis,  planning of further management, and coordination of care.  MEDICATIONS: Scheduled Meds: . [START ON 01/17/2018] dexamethasone  10 mg Oral Q breakfast   Followed by  . [START ON 01/18/2018] dexamethasone  6 mg Oral Q breakfast   Followed by  . [START ON 01/19/2018] dexamethasone  4 mg Oral Q breakfast   Followed by  . [START ON 01/20/2018] dexamethasone  2 mg Oral Q breakfast   Followed by  . [START ON 01/21/2018] dexamethasone  1 mg Oral Q breakfast   Followed by  . [START ON 01/22/2018] dexamethasone  0.5 mg Oral Q breakfast  . feeding supplement  1 Container Oral TID BM  . folic acid  2 mg Oral Daily  . furosemide  40 mg Intravenous Daily  . insulin aspart  0-9 Units Subcutaneous TID WC  . iopamidol  30 mL Oral Once  . multivitamin with minerals  1 tablet Oral Daily  . pantoprazole (PROTONIX) IV  40 mg Intravenous Q12H   Continuous Infusions: . anidulafungin 200 mg (01/15/18 1600)  . IMMUNE GLOBULIN 10% (HUMAN) IV - For Fluid Restriction Only     PRN Meds:.albuterol, hydrALAZINE, hyoscyamine, magic mouthwash, ondansetron **OR** ondansetron (ZOFRAN) IV, oxyCODONE   PHYSICAL EXAM: Vital signs: Vitals:  01/16/18 1101 01/16/18 1149 01/16/18 1205 01/16/18 1329  BP: (!) 149/96 (!) 135/92 (!) 147/100 (!) 141/93  Pulse:    77  Resp: _0 Temp: 97.7 F (36.5 C) (!) 97.5 F (36.4 C) 97.7 F (36.5 C) (!) 97.5 F (36.4 C)  TempSrc: Oral   Oral  SpO2: 100% 100% 100% 100%  Weight:      Height:       Filed Weights   01/11/18 0600 01/12/18 0631 01/14/18 0456  Weight: 67.3 kg (148 lb 5.9 oz) 68.2 kg (150 lb 5.7 oz) 73.6 kg (162 lb 4.1 oz)   Body mass index is 21.41 kg/m.   General appearance :Awake, alert, not in any distress.  Eyes:, pupils equally reactive to light and accomodation,+ scleral icterus. HEENT: Atraumatic and Normocephalic Neck: supple, no JVD. Resp:Good air entry bilaterally CVS: S1 S2 regular, no murmurs.  GI: Bowel sounds present, Non tender and not  distended with no gaurding, rigidity or rebound. Extremities: B/L Lower Ext shows ++edema, both legs are warm to touch Neurology:  speech clear,Non focal, sensation is grossly intact. Psychiatric: Normal judgment and insight. Normal mood. Musculoskeletal:No digital cyanosis Skin:No Rash, warm and dry Wounds:N/A I have personally reviewed following labs and imaging studies  LABORATORY DATA: CBC: Recent Labs  Lab 01/11/18 0620 01/12/18 0601 01/13/18 0821 01/14/18 0624 01/15/18 0641 01/16/18 0603  WBC 2.9* 4.3 2.9* 2.8* 2.7* 3.9*  NEUTROABS 2.7 3.9 2.8 2.5 2.3  --   HGB 13.4 13.7 11.8* 11.9* 12.7 10.8*  HCT 37.4 38.4 32.9* 34.1* 35.4* 29.3*  MCV 78.1 80.0 79.7 80.8 81.4 78.3  PLT 79* 71* 57* 65* 59* 43*    Basic Metabolic Panel: Recent Labs  Lab 01/12/18 0601 01/13/18 0821 01/14/18 0624 01/15/18 0641 01/16/18 0603  NA 132* 128* 134* 125* 131*  K 3.8 3.2* 3.2* 3.3* 2.9*  CL 107 103 107 94* 96*  CO2 19* 18* _1 GLUCOSE 154* 207* 74 98 180*  BUN _2 CREATININE <0.30* 0.32* 0.33* <0.30* <0.30*  CALCIUM 7.9* 7.5* 7.9* 7.7* 8.3*  MG  --  1.4*  --   --   --     GFR: CrCl cannot be calculated (This lab value cannot be used to calculate CrCl because it is not a number: <0.30).  Liver Function Tests: Recent Labs  Lab 01/12/18 0601 01/13/18 0821 01/14/18 0624 01/15/18 0641 01/16/18 0603  AST 125* 114* 111* 138* 130*  ALT 76* 79* 82* 91* 95*  ALKPHOS 537* 465* 448* 448* 424*  BILITOT 11.4* 10.9* 10.7* 17.0* 18.0*  PROT 4.8* 4.9* 4.7* 4.6* 5.1*  ALBUMIN 1.9* 1.8* 1.8* 1.7* 2.0*   Recent Labs  Lab 01/12/18 0601  LIPASE 42   No results for input(s): AMMONIA in the last 168 hours.  Coagulation Profile: Recent Labs  Lab 01/12/18 0601 01/13/18 0821 01/14/18 0624 01/15/18 0641 01/16/18 0603  INR 0.94 0.91 0.94 1.01 1.03    Cardiac Enzymes: No results for input(s): CKTOTAL, CKMB, CKMBINDEX, TROPONINI in the last 168 hours.  BNP (last 3  results) No results for input(s): PROBNP in the last 8760 hours.  HbA1C: No results for input(s): HGBA1C in the last 72 hours.  CBG: Recent Labs  Lab 01/15/18 1141 01/15/18 1607 01/15/18 2102 01/16/18 0728 01/16/18 1136  GLUCAP 102* 223* 283* 123* 124*    Lipid Profile: No results for input(s): CHOL, HDL, LDLCALC, TRIG, CHOLHDL, LDLDIRECT in the last 72 hours.  Thyroid Function  Tests: No results for input(s): TSH, T4TOTAL, FREET4, T3FREE, THYROIDAB in the last 72 hours.  Anemia Panel: No results for input(s): VITAMINB12, FOLATE, FERRITIN, TIBC, IRON, RETICCTPCT in the last 72 hours.  Urine analysis:    Component Value Date/Time   COLORURINE AMBER (A) 01/06/2018 1702   APPEARANCEUR HAZY (A) 01/06/2018 1702   LABSPEC 1.017 01/06/2018 1702   PHURINE 6.0 01/06/2018 1702   GLUCOSEU >=500 (A) 01/06/2018 1702   HGBUR SMALL (A) 01/06/2018 1702   BILIRUBINUR MODERATE (A) 01/06/2018 1702   KETONESUR NEGATIVE 01/06/2018 1702   PROTEINUR NEGATIVE 01/06/2018 1702   UROBILINOGEN 0.2 12/07/2014 1441   NITRITE NEGATIVE 01/06/2018 1702   LEUKOCYTESUR NEGATIVE 01/06/2018 1702    Sepsis Labs: Lactic Acid, Venous    Component Value Date/Time   LATICACIDVEN 2.4 (HH) 01/07/2018 0431    MICROBIOLOGY: Recent Results (from the past 240 hour(s))  MRSA PCR Screening     Status: None   Collection Time: 01/06/18  6:59 PM  Result Value Ref Range Status   MRSA by PCR NEGATIVE NEGATIVE Final    Comment:        The GeneXpert MRSA Assay (FDA approved for NASAL specimens only), is one component of a comprehensive MRSA colonization surveillance program. It is not intended to diagnose MRSA infection nor to guide or monitor treatment for MRSA infections.   Aerobic/Anaerobic Culture (surgical/deep wound)     Status: None   Collection Time: 01/07/18  5:59 PM  Result Value Ref Range Status   Specimen Description LIVER BIOPSY  Final   Special Requests NONE  Final   Gram Stain   Final     MODERATE WBC PRESENT, PREDOMINANTLY MONONUCLEAR NO ORGANISMS SEEN    Culture   Final    RARE PROPIONIBACTERIUM ACNES CRITICAL RESULT CALLED TO, READ BACK BY AND VERIFIED WITH: D SCOTT,RN AT 1540 01/12/18 BY L BENFIELD CONCERNING GROWTH ON CULTURE Performed at [facility], [place] [address] [place], [place] [address]    Report Status 01/15/2018 FINAL  Final  Acid Fast Smear (AFB)     Status: None   Collection Time: 01/07/18  5:59 PM  Result Value Ref Range Status   AFB Specimen Processing Concentration  Final   Acid Fast Smear Negative  Final    Comment: (NOTE) Performed At: [facility] [place], [place] [id number] Rush Farmer MD [id number]    Source (AFB) LIVER  Final    Comment: BIOPSY  Fungus Culture With Stain     Status: None (Preliminary result)   Collection Time: 01/07/18  5:59 PM  Result Value Ref Range Status   Fungus Stain Final report  Final    Comment: (NOTE) Performed At: [facility] [place], [place] [id number] Rush Farmer MD [id number]    Fungus (Mycology) Culture PENDING  Incomplete   Fungal Source LIVER  Final    Comment: BIOPSY  Fungus Culture Result     Status: None   Collection Time: 01/07/18  5:59 PM  Result Value Ref Range Status   Result 1 Comment  Final    Comment: (NOTE) KOH/Calcofluor preparation:  no fungus observed. Performed At: [facility] [place], [place] [id number] Rush Farmer MD [id number]     RADIOLOGY STUDIES/RESULTS: Dg Chest 2 View  Result Date: 12/31/2017 CLINICAL DATA:  Nipple marker request EXAM: CHEST  2 VIEW COMPARISON:  1100 hours FINDINGS: Left basilar nodular density corresponds to the nipple marker. Allowing for nipple shadows, lungs are  clear other than minimal subsegmental atelectasis for scarring at the left base. Lungs are clear. No pneumothorax or pleural effusion. Mild cardiomegaly. Normal vascularity. IMPRESSION: No active  cardiopulmonary disease. Electronically Signed   By: Marybelle Killings M.D.   On: 12/31/2017 19:13   Ct Head Wo Contrast  Result Date: 01/05/2018 CLINICAL DATA:  42 year old female with history of altered level of consciousness. Fall this morning. Possible seizure. EXAM: CT HEAD WITHOUT CONTRAST TECHNIQUE: Contiguous axial images were obtained from the base of the skull through the vertex without intravenous contrast. COMPARISON:  Head CT 03/10/2012. FINDINGS: Brain: No evidence of acute infarction, hemorrhage, hydrocephalus, extra-axial collection or mass lesion/mass effect. Vascular: No hyperdense vessel or unexpected calcification. Skull: Normal. Negative for fracture or focal lesion. Sinuses/Orbits: No acute finding. Other: None. IMPRESSION: 1. No acute intracranial abnormalities. The appearance of the brain is normal. Electronically Signed   By: Vinnie Langton M.D.   On: 01/05/2018 11:01   Ct Abdomen Pelvis W Contrast  Result Date: 01/06/2018 CLINICAL DATA:  Abdominal distension with elevated lactate EXAM: CT ABDOMEN AND PELVIS WITH CONTRAST TECHNIQUE: Multidetector CT imaging of the abdomen and pelvis was performed using the standard protocol following bolus administration of intravenous contrast. CONTRAST:  142m ISOVUE-300 IOPAMIDOL (ISOVUE-300) INJECTION 61% COMPARISON:  MRI 12/31/2017, CT abdomen pelvis 12/30/2017, 10/23/2017, 10/21/2017, 10/13/2017, 10/16/2016 FINDINGS: Lower chest: Lung bases demonstrate moderate pleural effusions, increased compared to prior. Partial consolidation in the lower lobes, atelectasis versus pneumonia. Mild cardiomegaly. Mild distal esophageal and GE junction thickening. Hepatobiliary: No focal hepatic abnormality. Surgical clips at the gallbladder fossa. No biliary dilatation Pancreas: Decreased pancreatic edema.  No ductal dilatation Spleen: Interval enlargement of the spleen now measuring 14 cm on coronal views. Adrenals/Urinary Tract: Adrenal glands are unremarkable.  Kidneys are normal, without renal calculi, focal lesion, or hydronephrosis. Bladder is unremarkable. Stomach/Bowel: Stomach is unremarkable. Fluid-filled loops of small bowel within the abdomen. No significant wall thickening. No intramural air. Normal appendix. No colon wall thickening. Vascular/Lymphatic: Nonaneurysmal aorta. Patent portal vein. Non enhancement of the splenic vein consistent with occlusion, new since the comparison CT. Enlarged retroperitoneal lymph nodes as before. Reproductive: Uterus and bilateral adnexa are unremarkable. Other: Large volume of ascites, increased compared to prior. No free air. Musculoskeletal: Degenerative changes. No acute or suspicious abnormality. IMPRESSION: 1. Moderate bilateral pleural effusions, increased compared to prior. Partial consolidations in the bilateral lower lobes may reflect atelectasis or pneumonia 2. 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 Portal vein remains patent. Interval enlargement of the spleen. 3. Decreased pancreatic edema. Large volume of ascites in the abdomen and pelvis, increased compared to prior. 4. Fluid-filled loops of central small bowel, possible ileus. No intramural air or portal venous gas. 5. Stable retroperitoneal lymphadenopathy Electronically Signed   By: KDonavan FoilM.D.   On: 01/06/2018 23:28   Ct Abdomen Pelvis W Contrast  Result Date: 12/30/2017 CLINICAL DATA:  Generalized abdominal pain, vomiting and hematuria. HIV positive. Elevated lipase. Prior cholecystectomy. EXAM: CT ABDOMEN AND PELVIS WITH CONTRAST TECHNIQUE: Multidetector CT imaging of the abdomen and pelvis was performed using the standard protocol following bolus administration of intravenous contrast. CONTRAST:  80 cc Isovue-300 IV. COMPARISON:  10/21/2017 CT abdomen/pelvis. FINDINGS: Lower chest: No significant pulmonary nodules or acute consolidative airspace disease. Hepatobiliary: Normal liver size.  No liver mass. Cholecystectomy. No biliary ductal dilatation. Common bile duct diameter 5 mm. Pancreas: There is acute diffuse pancreatic enlargement with prominent pancreatic and peripancreatic edema with  ill-defined peripancreatic fluid extending into the bilateral anterior paranephric retroperitoneal spaces and into the lesser sac, compatible with acute pancreatitis. No regions of pancreatic parenchymal nonenhancement or gas. No pancreatic mass or duct dilation. No measurable peripancreatic fluid collections. Spleen: Normal size. No mass. Adrenals/Urinary Tract: Normal adrenals. Normal kidneys with no hydronephrosis and no renal mass. Normal bladder. Stomach/Bowel: Normal non-distended stomach. Normal caliber small bowel with no small bowel wall thickening. Appendix. Normal large bowel with no diverticulosis, large bowel wall thickening or pericolonic fat stranding. Vascular/Lymphatic: Normal caliber abdominal aorta. Patent portal, splenic, hepatic and renal veins. Stable mildly enlarged 1.0 cm right retrocrural node (series 2/image 15). Stable mildly enlarged 1.2 cm porta hepatis node (series 2/image 22). Stable mild left para-aortic adenopathy measuring up to 1.4 cm (series 2/image 31). No new pathologically enlarged abdominopelvic nodes. Reproductive: Grossly normal uterus.  No adnexal mass. Other: Small volume ascites. No pneumoperitoneum. No focal fluid collection. Musculoskeletal: No aggressive appearing focal osseous lesions. IMPRESSION: 1. Acute pancreatitis diffusely involving the pancreas, without complication. 2. Small volume ascites. 3. Chronic porta hepatis and retroperitoneal adenopathy is stable and probably reactive. Electronically Signed   By: Ilona Sorrel M.D.   On: 12/30/2017 02:45   Ir Venogram Hepatic W Hemodynamic Evaluation  Result Date: 01/07/2018 INDICATION: 42 year old female with a complex medical history including markedly abnormal liver enzymes. Request for liver biopsy. Patient  has thrombocytopenia and received platelets prior to the procedure. EXAM: TRANSJUGULAR LIVER BIOPSY WITH FLUOROSCOPY HEPATIC VENOGRAPHY WITH PRESSURES ULTRASOUND GUIDANCE FOR VASCULAR ACCESS MEDICATIONS: None. ANESTHESIA/SEDATION: Moderate (conscious) sedation was employed during this procedure. A total of Versed 1 mg and Fentanyl 25 mcg was administered intravenously. Moderate Sedation Time: 41 minutes. The patient's level of consciousness and vital signs were monitored continuously by radiology nursing throughout the procedure under my direct supervision. FLUOROSCOPY TIME:  Fluoroscopy Time: 7 minutes and 54 seconds. 262 mGy COMPLICATIONS: None immediate. PROCEDURE: Informed written consent was obtained from the patient after a thorough discussion of the procedural risks, benefits and alternatives. All questions were addressed. Maximal Sterile Barrier Technique was utilized including caps, mask, sterile gowns, sterile gloves, sterile drape, hand hygiene and skin antiseptic. A timeout was performed prior to the initiation of the procedure. Ultrasound confirmed a patent right internal jugular vein. The right side of the neck was prepped and draped in a sterile fashion. Skin was anesthetized with 1% lidocaine. 21 gauge needle directed into the right internal jugular vein with ultrasound guidance. Micropuncture dilator set was placed. Five French catheter was advanced into the IVC and a Bentson wire was placed. The tract was dilated to accommodate a 10 [place] sheath. 5 French catheter was advanced into a right hepatic vein. Hepatic venography was performed. Wedged and free hepatic vein pressures were obtained. The transjugular biopsy kit was then advanced through the sheath over a stiff Amplatz wire. Total of 5 core biopsies were performed. Four adequate core biopsies were obtained. Three specimens placed in formalin for pathology. One specimen was placed in saline for microbiology. Follow-up venography was performed  through the 5 French catheter at of the procedure. Sheath were removed with manual compression. Fluoroscopic and ultrasound images were taken and saved for documentation. FINDINGS: Two sets of hepatic venous pressures were obtained. The mean gradient on the first set of pressures was 7. The gradient on the second set of pressures was 6-7. Findings are suggestive for an elevated hepatic venous pressure gradient and concerning for portal hypertension. Core biopsies obtained from the right hepatic lobe. Four  adequate specimens were obtained. No significant bleeding or extravasation on the final hepatic venography. IMPRESSION: Successful transjugular liver biopsy. Specimens sent to pathology and microbiology. Elevated hepatic venous pressure gradient. Electronically Signed   By: Markus Daft M.D.   On: 01/07/2018 18:17   Ir Transcatheter Bx  Result Date: 01/07/2018 INDICATION: 42 year old female with a complex medical history including markedly abnormal liver enzymes. Request for liver biopsy. Patient has thrombocytopenia and received platelets prior to the procedure. EXAM: TRANSJUGULAR LIVER BIOPSY WITH FLUOROSCOPY HEPATIC VENOGRAPHY WITH PRESSURES ULTRASOUND GUIDANCE FOR VASCULAR ACCESS MEDICATIONS: None. ANESTHESIA/SEDATION: Moderate (conscious) sedation was employed during this procedure. A total of Versed 1 mg and Fentanyl 25 mcg was administered intravenously. Moderate Sedation Time: 41 minutes. The patient's level of consciousness and vital signs were monitored continuously by radiology nursing throughout the procedure under my direct supervision. FLUOROSCOPY TIME:  Fluoroscopy Time: 7 minutes and 54 seconds. 622 mGy COMPLICATIONS: None immediate. PROCEDURE: Informed written consent was obtained from the patient after a thorough discussion of the procedural risks, benefits and alternatives. All questions were addressed. Maximal Sterile Barrier Technique was utilized including caps, mask, sterile gowns, sterile  gloves, sterile drape, hand hygiene and skin antiseptic. A timeout was performed prior to the initiation of the procedure. Ultrasound confirmed a patent right internal jugular vein. The right side of the neck was prepped and draped in a sterile fashion. Skin was anesthetized with 1% lidocaine. 21 gauge needle directed into the right internal jugular vein with ultrasound guidance. Micropuncture dilator set was placed. Five French catheter was advanced into the IVC and a Bentson wire was placed. The tract was dilated to accommodate a 10 [place] sheath. 5 French catheter was advanced into a right hepatic vein. Hepatic venography was performed. Wedged and free hepatic vein pressures were obtained. The transjugular biopsy kit was then advanced through the sheath over a stiff Amplatz wire. Total of 5 core biopsies were performed. Four adequate core biopsies were obtained. Three specimens placed in formalin for pathology. One specimen was placed in saline for microbiology. Follow-up venography was performed through the 5 French catheter at of the procedure. Sheath were removed with manual compression. Fluoroscopic and ultrasound images were taken and saved for documentation. FINDINGS: Two sets of hepatic venous pressures were obtained. The mean gradient on the first set of pressures was 7. The gradient on the second set of pressures was 6-7. Findings are suggestive for an elevated hepatic venous pressure gradient and concerning for portal hypertension. Core biopsies obtained from the right hepatic lobe. Four adequate specimens were obtained. No significant bleeding or extravasation on the final hepatic venography. IMPRESSION: Successful transjugular liver biopsy. Specimens sent to pathology and microbiology. Elevated hepatic venous pressure gradient. Electronically Signed   By: Markus Daft M.D.   On: 01/07/2018 18:17   Mr 3d Recon At Scanner  Result Date: 12/31/2017 CLINICAL DATA:  Abnormal liver function tests. Elevated  lipase. HIV. EXAM: MRI ABDOMEN WITHOUT AND WITH CONTRAST (INCLUDING MRCP) TECHNIQUE: Multiplanar multisequence MR imaging of the abdomen was performed both before and after the administration of intravenous contrast. Heavily T2-weighted images of the biliary and pancreatic ducts were obtained, and three-dimensional MRCP images were rendered by post processing. CONTRAST:  69m MULTIHANCE GADOBENATE DIMEGLUMINE 529 MG/ML IV SOLN COMPARISON:  CT 12/30/2017 FINDINGS: Lower chest:  Lung bases are clear. Hepatobiliary: No intrahepatic duct dilatation. No focal hepatic lesion. No enhancing hepatic lesion. Postcholecystectomy. Common bile duct normal caliber. Pancreas: Mild edema associated with the pancreas. Pancreatic duct is normal  caliber. No organized fluid collections. Small amount fluid along the LEFT gutter. Contrast enhanced imaging demonstrates uniform enhancement of the pancreas Spleen: Normal spleen. Adrenals/urinary tract: Adrenal glands and kidneys are normal. Stomach/Bowel: Stomach and limited of the small bowel is unremarkable Vascular/Lymphatic: There are enlarged lymph nodes the retroperitoneum along the aorta the kidneys. For example 12 mm and 8 mm short axis lymph nodes LEFT aorta on image 67, series 12/19/2001 Musculoskeletal: No aggressive osseous lesion IMPRESSION: 1. Mild pancreatic edema associated with pancreatitis. 2. Moderate intraperitoneal free fluid. No organized fluid collections. 3. No biliary obstruction.  Postcholecystectomy. 4. Mild retroperitoneal lymphadenopathy favor HIV adenopathy. Electronically Signed   By: Suzy Bouchard M.D.   On: 12/31/2017 17:36   Dg Chest Port 1 View  Result Date: 01/02/2018 CLINICAL DATA:  Fever.  Status post EGD. EXAM: PORTABLE CHEST 1 VIEW COMPARISON:  Chest x-ray dated December 31, 2017. FINDINGS: Stable mild cardiomegaly. Mild interstitial edema. Small bilateral pleural effusions with bibasilar atelectasis. No pneumothorax. No acute osseous  abnormality. IMPRESSION: New mild interstitial pulmonary edema and small bilateral pleural effusions. Electronically Signed   By: Titus Dubin M.D.   On: 01/02/2018 10:09   Dg Chest Port 1 View  Result Date: 12/31/2017 CLINICAL DATA:  Shortness of breath, chest congestion, and productive cough. History of HIV, nonsmoker. EXAM: PORTABLE CHEST 1 VIEW COMPARISON:  Chest x-ray and chest CT scan of October 21, 2017 FINDINGS: The lungs are well-expanded. There is no focal infiltrate. There is a rounded approximately 1.5 cm diameter structure projecting over the posterolateral aspect of the left ninth rib which may reflect a nipple shadow. No similar finding is noted on the right however. Some asymmetry of the nipples was noted on the CT scan of October 21, 2017. The apices appear clear. The heart and pulmonary vascularity are normal. The mediastinum is normal in width. No mediastinal or hilar lymphadenopathy is observed. There is no pleural effusion. The bony thorax is unremarkable. IMPRESSION: No acute infiltrate or definite mass. Presumed prominent nipple shadow on the left. A repeat chest x-ray with nipple markers and a lateral view would be useful to more completely evaluate the thorax in this patient with cryptogenic hemoptysis. These findings were discussed by telephone with Dr. Florene Glen. Electronically Signed   By: David  Martinique M.D.   On: 12/31/2017 13:27   Dg Esophagus Dilation  Result Date: 01/14/2018 ESOPHAGEAL DILATATION: Fluoroscopy was provided for use by the requesting physician.  No images were obtained for radiographic interpretation.  Mr Abdomen Mrcp Moise Boring Contast  Result Date: 12/31/2017 CLINICAL DATA:  Abnormal liver function tests. Elevated lipase. HIV. EXAM: MRI ABDOMEN WITHOUT AND WITH CONTRAST (INCLUDING MRCP) TECHNIQUE: Multiplanar multisequence MR imaging of the abdomen was performed both before and after the administration of intravenous contrast. Heavily T2-weighted images of the  biliary and pancreatic ducts were obtained, and three-dimensional MRCP images were rendered by post processing. CONTRAST:  42m MULTIHANCE GADOBENATE DIMEGLUMINE 529 MG/ML IV SOLN COMPARISON:  CT 12/30/2017 FINDINGS: Lower chest:  Lung bases are clear. Hepatobiliary: No intrahepatic duct dilatation. No focal hepatic lesion. No enhancing hepatic lesion. Postcholecystectomy. Common bile duct normal caliber. Pancreas: Mild edema associated with the pancreas. Pancreatic duct is normal caliber. No organized fluid collections. Small amount fluid along the LEFT gutter. Contrast enhanced imaging demonstrates uniform enhancement of the pancreas Spleen: Normal spleen. Adrenals/urinary tract: Adrenal glands and kidneys are normal. Stomach/Bowel: Stomach and limited of the small bowel is unremarkable Vascular/Lymphatic: There are enlarged lymph nodes the retroperitoneum along  the aorta the kidneys. For example 12 mm and 8 mm short axis lymph nodes LEFT aorta on image 67, series 12/19/2001 Musculoskeletal: No aggressive osseous lesion IMPRESSION: 1. Mild pancreatic edema associated with pancreatitis. 2. Moderate intraperitoneal free fluid. No organized fluid collections. 3. No biliary obstruction.  Postcholecystectomy. 4. Mild retroperitoneal lymphadenopathy favor HIV adenopathy. Electronically Signed   By: Suzy Bouchard M.D.   On: 12/31/2017 17:36     LOS: 17 days   Oren Binet, MD  [facility] Pager:[phone]  If 7PM-7AM, please contact night-coverage [email or web address] Password [email or web address] 01/16/2018, 2:08 PM

## 2018-01-17 LAB — HEPATIC FUNCTION PANEL
ALK PHOS: 423 U/L — AB (ref 38–126)
ALT: 80 U/L — AB (ref 14–54)
AST: 89 U/L — ABNORMAL HIGH (ref 15–41)
Albumin: 1.8 g/dL — ABNORMAL LOW (ref 3.5–5.0)
BILIRUBIN DIRECT: 7.3 mg/dL — AB (ref 0.1–0.5)
BILIRUBIN INDIRECT: 5.7 mg/dL — AB (ref 0.3–0.9)
BILIRUBIN TOTAL: 13 mg/dL — AB (ref 0.3–1.2)
Total Protein: 5.3 g/dL — ABNORMAL LOW (ref 6.5–8.1)

## 2018-01-17 LAB — PROTIME-INR
INR: 0.97
Prothrombin Time: 12.8 seconds (ref 11.4–15.2)

## 2018-01-17 LAB — BASIC METABOLIC PANEL
Anion gap: 9 (ref 5–15)
BUN: 10 mg/dL (ref 6–20)
CO2: 26 mmol/L (ref 22–32)
Calcium: 8.1 mg/dL — ABNORMAL LOW (ref 8.9–10.3)
Chloride: 95 mmol/L — ABNORMAL LOW (ref 101–111)
Creatinine, Ser: <0.3 mg/dL — ABNORMAL LOW (ref 0.44–1.00)
GLUCOSE: 129 mg/dL — AB (ref 65–99)
POTASSIUM: 2.9 mmol/L — AB (ref 3.5–5.1)
Sodium: 130 mmol/L — ABNORMAL LOW (ref 135–145)

## 2018-01-17 LAB — CBC
HEMATOCRIT: 27.3 % — AB (ref 36.0–46.0)
Hemoglobin: 9.7 g/dL — ABNORMAL LOW (ref 12.0–15.0)
MCH: 28.6 pg (ref 26.0–34.0)
MCHC: 35.5 g/dL (ref 30.0–36.0)
MCV: 80.5 fL (ref 78.0–100.0)
Platelets: 55 10*3/uL — ABNORMAL LOW (ref 150–400)
RBC: 3.39 MIL/uL — ABNORMAL LOW (ref 3.87–5.11)
RDW: 25.2 % — AB (ref 11.5–15.5)
WBC: 3.6 10*3/uL — ABNORMAL LOW (ref 4.0–10.5)

## 2018-01-17 LAB — GLUCOSE, CAPILLARY
GLUCOSE-CAPILLARY: 198 mg/dL — AB (ref 65–99)
Glucose-Capillary: 111 mg/dL — ABNORMAL HIGH (ref 65–99)
Glucose-Capillary: 167 mg/dL — ABNORMAL HIGH (ref 65–99)

## 2018-01-17 LAB — MAGNESIUM: Magnesium: 1.4 mg/dL — ABNORMAL LOW (ref 1.7–2.4)

## 2018-01-17 MED ORDER — POTASSIUM CHLORIDE CRYS ER 20 MEQ PO TBCR
40.0000 meq | EXTENDED_RELEASE_TABLET | Freq: Once | ORAL | Status: DC
  Filled 2018-01-17: qty 2

## 2018-01-17 MED ORDER — MAGIC MOUTHWASH W/LIDOCAINE
10.0000 mL | Freq: Four times a day (QID) | ORAL | Status: DC | PRN
  Administered 2018-01-18 – 2018-01-20 (×3): 10 mL via ORAL
  Filled 2018-01-17 (×3): qty 10

## 2018-01-17 MED ORDER — MAGNESIUM SULFATE 2 GM/50ML IV SOLN
2.0000 g | Freq: Once | INTRAVENOUS | Status: AC
  Administered 2018-01-17: 2 g via INTRAVENOUS
  Filled 2018-01-17: qty 50

## 2018-01-17 MED ORDER — POTASSIUM CHLORIDE CRYS ER 20 MEQ PO TBCR: 40.0000 meq | EXTENDED_RELEASE_TABLET | Freq: Every day | ORAL | Status: DC

## 2018-01-17 MED ORDER — POTASSIUM CHLORIDE 10 MEQ/100ML IV SOLN
10.0000 meq | INTRAVENOUS | Status: AC
  Administered 2018-01-17 (×2): 10 meq via INTRAVENOUS
  Filled 2018-01-17 (×2): qty 100

## 2018-01-17 NOTE — Progress Notes (Signed)
PROGRESS NOTE        PATIENT DETAILS Name: [name] Age: 42 y.o. Sex: female Date of Birth: 09/25/1976 Admit Date: 12/29/2017 Admitting Physician Norval Morton, MD NDL:OPRAFOA, Christean Grief, MD  Brief Narrative: Patient is a 42 y.o. female with history of HIV who presented to the hospital with worsening abdominal pain-felt to be secondary to acute pancreatitis.  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  Hospital course was complicated by development of ITP, dysphagia due to esophageal stricture requiring EGD and dilatation, and massive volume overload.  She slowly continues to improve-see below for further details  Subjective: Afebrile for the past 48 hours-decrease swelling in the lower extremities-no other complatins  Assessment/Plan: Acute pancreatitis: Unclear etiology-patient is status post cholecystectomy-no history of alcohol use.  MRCP of the abdomen negative for biliary obstruction/pathology.  Pancreatitis seems to have resolved with supportive care.  Tolerating advancement in diet  Acute hepatitis: Liver biopsy on 1/23 showed giant cell hepatitis with vanishing Dr. Marin Comment to be secondary to antiretrovirals or HIV itself.  LFTs did worsen over the past few days-but bilirubin levels improved today.  INR is stable and she is not encephalopathic. Initially contemplated starting ursodiol-however infectious disease wanted to hold off-if LFTs continue to worsen-we may have to revisit and discuss with infectious disease.  Will await further recommendations from ID and GI service.  Fever: Occurred 1/31-could be related to worsening hepatitis.  Blood cultures on 1/31- so far.  Continue to monitor off antimicrobial therapy-ID following.    Esophageal stricture: Underwent EGD and dilatation on 1/30-tolerating advancement  in diet.  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  ITP: Platelet count initially improved with steroids and endplate-but as steroids have started to be tapered down-platelet count again decreased-after discussion with hematology on 2/1-she was started on IVIG-platelet count seems to be slowly improving again.  Dr. Marin Olp following.    Hyponatremia: Secondary to hypervolemia-although edema has improved she still remains volume overloaded-continue Lasix.  Sodium levels have slowly started to improve.   Hypokalemia: Likely secondary to Lasix-replete and recheck.  Hypomagnesemia: Replete and recheck  Massive volume overload: Secondary to acute illness/hepatitis and severe hypoalbuminemia-continue Lasix.  UA was negative for proteinuria.  Her weights have now started to downtrend-decreased to 132 pounds this morning-was approximately 150 pounds on 1/28 (weight on admission was 102 pounds).   Follow weight/intake/output daily.    Esophageal candidiasis: Appears to be stable-continue anidulafungin-we will await further recommendations from ID  HIV: poorly controlled-last CD4 count of 8-antiretrovirals on hold due to ongoing hepatitis.  ID plans on restarting an alternative antiretroviral agent over the next few days  Steroid-induced hyperglycemia: Suspect will continue to improve as steroids are being tapered down-had hypoglycemic episode this morning.  Leukopenia/mild anemia: Likely secondary to underlying HIV.  Continue to follow.  Sinus bradycardia: Continue telemetry monitoring-avoid rate limiting agents  DVT Prophylaxis: SCD's  Code Status: Full code   Family Communication: None at bedside  Disposition Plan: Remain inpatient-we will require several more days of hospitalization before discharge  Antimicrobial agents: Anti-infectives (From admission, onward)   Start     Dose/Rate Route Frequency Ordered Stop    01/07/18 2000  bictegravir-emtricitabine-tenofovir AF (BIKTARVY) 50-200-25 MG per tablet 1 tablet  Status:  Discontinued     1 tablet Oral Daily 01/07/18 1449 01/13/18 1356   01/07/18 1000  ethambutol (MYAMBUTOL) tablet 850 mg  Status:  Discontinued     15 mg/kg  55.2 kg Oral Daily 01/06/18 1739 01/08/18 1420   01/06/18 1800  azithromycin (ZITHROMAX) 500 mg in dextrose 5 % 250 mL IVPB  Status:  Discontinued     500 mg 250 mL/hr over 60 Minutes Intravenous Every 24 hours 01/06/18 1737 01/08/18 1420   01/06/18 1200  bictegravir-emtricitabine-tenofovir AF (BIKTARVY) 50-200-25 MG per tablet 1 tablet  Status:  Discontinued     1 tablet Oral Daily 01/05/18 1626 01/06/18 1741   01/05/18 1500  anidulafungin (ERAXIS) 200 mg in sodium chloride 0.9 % 200 mL IVPB     200 mg 78 mL/hr over 200 Minutes Intravenous Every 24 hours 01/05/18 1331     01/02/18 2200  vancomycin (VANCOCIN) 500 mg in sodium chloride 0.9 % 100 mL IVPB  Status:  Discontinued     500 mg 100 mL/hr over 60 Minutes Intravenous Every 12 hours 01/02/18 1111 01/02/18 1559   01/02/18 1400  ceFEPIme (MAXIPIME) 1 g in dextrose 5 % 50 mL IVPB  Status:  Discontinued     1 g 100 mL/hr over 30 Minutes Intravenous Every 8 hours 01/02/18 1111 01/02/18 1559   01/02/18 1200  vancomycin (VANCOCIN) IVPB 1000 mg/200 mL premix     1,000 mg 200 mL/hr over 60 Minutes Intravenous  Once 01/02/18 1111 01/02/18 1344   12/31/17 2000  dapsone tablet 100 mg  Status:  Discontinued     100 mg Oral Daily 12/31/17 1859 01/03/18 1619   12/31/17 1000  fluconazole (DIFLUCAN) IVPB 100 mg  Status:  Discontinued     100 mg 50 mL/hr over 60 Minutes Intravenous Every 24 hours 12/30/17 1001 12/30/17 1431   12/31/17 1000  fluconazole (DIFLUCAN) IVPB 400 mg  Status:  Discontinued     400 mg 100 mL/hr over 120 Minutes Intravenous Every 24 hours 12/30/17 1431 01/03/18 1111   12/30/17 1100  fluconazole (DIFLUCAN) IVPB 200 mg     200 mg 100 mL/hr over 60 Minutes Intravenous   Once 12/30/17 1001 12/30/17 1220   12/30/17 1000  bictegravir-emtricitabine-tenofovir AF (BIKTARVY) 50-200-25 MG per tablet 1 tablet  Status:  Discontinued     1 tablet Oral Daily 12/30/17 0933 01/05/18 1544      Procedures: 1/30>> EGD 1/23>> liver biopsy  CONSULTS:  ID, GI and hematology/oncology  Time spent: 25-minutes-Greater than 50% of this time was spent in counseling, explanation of diagnosis, planning of further management, and coordination of care.  MEDICATIONS: Scheduled Meds: . [START ON 01/18/2018] dexamethasone  6 mg Oral Q breakfast   Followed by  . [START ON 01/19/2018] dexamethasone  4 mg Oral Q breakfast   Followed by  . [START ON 01/20/2018] dexamethasone  2 mg Oral Q breakfast   Followed by  . [START ON 01/21/2018] dexamethasone  1 mg Oral Q breakfast   Followed by  . [START ON 01/22/2018] dexamethasone  0.5 mg Oral Q breakfast  . feeding supplement  1 Container Oral TID BM  . folic acid  2 mg Oral Daily  . furosemide  40 mg Intravenous Daily  . insulin aspart  0-9 Units Subcutaneous TID WC  . iopamidol  30 mL Oral Once  . multivitamin with minerals  1 tablet Oral Daily  . pantoprazole (PROTONIX) IV  40 mg Intravenous Q12H   Continuous Infusions: . anidulafungin  200 mg (01/16/18 1525)  . IMMUNE GLOBULIN 10% (HUMAN) IV - For Fluid Restriction Only     PRN Meds:.albuterol, hydrALAZINE, hyoscyamine, magic mouthwash, ondansetron **OR** ondansetron (ZOFRAN) IV, oxyCODONE   PHYSICAL EXAM: Vital signs: Vitals:   01/16/18 2030 01/17/18 0010 01/17/18 0500 01/17/18 0530  BP:  (!) 160/84  (!) 162/82  Pulse:  (!) 51  70  Resp:  18  18  Temp:  97.6 F (36.4 C)  97.9 F (36.6 C)  TempSrc:  Oral  Oral  SpO2:  100%  100%  Weight: 58.7 kg (129 lb 4.8 oz)  59.9 kg (132 lb 0.9 oz)   Height:       Filed Weights   01/14/18 0456 01/16/18 2030 01/17/18 0500  Weight: 73.6 kg (162 lb 4.1 oz) 58.7 kg (129 lb 4.8 oz) 59.9 kg (132 lb 0.9 oz)   Body mass index is 17.42 kg/m.    General appearance :Awake, alert, not in any distress.  Eyes:, pupils equally reactive to light and accomodation,+ scleral icterus. HEENT: Atraumatic and Normocephalic Neck: supple, no JVD. Resp:Good air entry bilaterally CVS: S1 S2 regular, no murmurs.  GI: Bowel sounds present, Non tender and not distended with no gaurding, rigidity or rebound. Extremities: B/L Lower Ext shows ++ edema, both legs are warm to touch Neurology:  speech clear,Non focal, sensation is grossly intact. Psychiatric: Normal judgment and insight. Normal mood. Musculoskeletal:No digital cyanosis Skin:No Rash, warm and dry Wounds:N/A  I have personally reviewed following labs and imaging studies  LABORATORY DATA: CBC: Recent Labs  Lab 01/11/18 0620 01/12/18 0601 01/13/18 2229 01/14/18 7989 01/15/18 0641 01/16/18 0603 01/17/18 0652  WBC 2.9* 4.3 2.9* 2.8* 2.7* 3.9* 3.6*  NEUTROABS 2.7 3.9 2.8 2.5 2.3  --   --   HGB 13.4 13.7 11.8* 11.9* 12.7 10.8* 9.7*  HCT 37.4 38.4 32.9* 34.1* 35.4* 29.3* 27.3*  MCV 78.1 80.0 79.7 80.8 81.4 78.3 80.5  PLT 79* 71* 57* 65* 59* 43* 55*    Basic Metabolic Panel: Recent Labs  Lab 01/13/18 0821 01/14/18 0624 01/15/18 0641 01/16/18 0603 01/17/18 0652  NA 128* 134* 125* 131* 130*  K 3.2* 3.2* 3.3* 2.9* 2.9*  CL 103 107 94* 96* 95*  CO2 18* _0 GLUCOSE 207* 74 98 180* 129*  BUN _1 CREATININE 0.32* 0.33* <0.30* <0.30* <0.30*  CALCIUM 7.5* 7.9* 7.7* 8.3* 8.1*  MG 1.4*  --   --   --  1.4*    GFR: CrCl cannot be calculated (This lab value cannot be used to calculate CrCl because it is not a number: <0.30).  Liver Function Tests: Recent Labs  Lab 01/13/18 0821 01/14/18 0624 01/15/18 0641 01/16/18 0603 01/17/18 0652  AST 114* 111* 138* 130* 89*  ALT 79* 82* 91* 95* 80*  ALKPHOS 465* 448* 448* 424* 423*  BILITOT 10.9* 10.7* 17.0* 18.0* 13.0*  PROT 4.9* 4.7* 4.6* 5.1* 5.3*  ALBUMIN 1.8* 1.8* 1.7* 2.0* 1.8*   Recent Labs  Lab  01/12/18 0601  LIPASE 42   No results for input(s): AMMONIA in the last 168 hours.  Coagulation Profile: Recent Labs  Lab 01/13/18 0821 01/14/18 0624 01/15/18 0641 01/16/18 0603 01/17/18 0652  INR 0.91 0.94 1.01 1.03 0.97    Cardiac Enzymes: No results for input(s): CKTOTAL, CKMB, CKMBINDEX, TROPONINI in the last 168 hours.  BNP (last 3 results) No results for input(s): PROBNP in the last 8760 hours.  HbA1C: No results for input(s):  HGBA1C in the last 72 hours.  CBG: Recent Labs  Lab 01/16/18 0728 01/16/18 1136 01/16/18 1646 01/16/18 2148 01/17/18 0751  GLUCAP 123* 124* 174* 143* 111*    Lipid Profile: No results for input(s): CHOL, HDL, LDLCALC, TRIG, CHOLHDL, LDLDIRECT in the last 72 hours.  Thyroid Function Tests: No results for input(s): TSH, T4TOTAL, FREET4, T3FREE, THYROIDAB in the last 72 hours.  Anemia Panel: No results for input(s): VITAMINB12, FOLATE, FERRITIN, TIBC, IRON, RETICCTPCT in the last 72 hours.  Urine analysis:    Component Value Date/Time   COLORURINE AMBER (A) 01/06/2018 1702   APPEARANCEUR HAZY (A) 01/06/2018 1702   LABSPEC 1.017 01/06/2018 1702   PHURINE 6.0 01/06/2018 1702   GLUCOSEU >=500 (A) 01/06/2018 1702   HGBUR SMALL (A) 01/06/2018 1702   BILIRUBINUR MODERATE (A) 01/06/2018 1702   KETONESUR NEGATIVE 01/06/2018 1702   PROTEINUR NEGATIVE 01/06/2018 1702   UROBILINOGEN 0.2 12/07/2014 1441   NITRITE NEGATIVE 01/06/2018 1702   LEUKOCYTESUR NEGATIVE 01/06/2018 1702    Sepsis Labs: Lactic Acid, Venous    Component Value Date/Time   LATICACIDVEN 2.4 (HH) 01/07/2018 0431    MICROBIOLOGY: Recent Results (from the past 240 hour(s))  Aerobic/Anaerobic Culture (surgical/deep wound)     Status: None   Collection Time: 01/07/18  5:59 PM  Result Value Ref Range Status   Specimen Description LIVER BIOPSY  Final   Special Requests NONE  Final   Gram Stain   Final    MODERATE WBC PRESENT, PREDOMINANTLY MONONUCLEAR NO  ORGANISMS SEEN    Culture   Final    RARE PROPIONIBACTERIUM ACNES CRITICAL RESULT CALLED TO, READ BACK BY AND VERIFIED WITH: D SCOTT,RN AT 1540 01/12/18 BY L BENFIELD CONCERNING GROWTH ON CULTURE Performed at [facility], [place] [address] [place], [place] [address]    Report Status 01/15/2018 FINAL  Final  Acid Fast Smear (AFB)     Status: None   Collection Time: 01/07/18  5:59 PM  Result Value Ref Range Status   AFB Specimen Processing Concentration  Final   Acid Fast Smear Negative  Final    Comment: (NOTE) Performed At: [facility] [place], [place] [id number] Rush Farmer MD [id number]    Source (AFB) LIVER  Final    Comment: BIOPSY  Fungus Culture With Stain     Status: None (Preliminary result)   Collection Time: 01/07/18  5:59 PM  Result Value Ref Range Status   Fungus Stain Final report  Final    Comment: (NOTE) Performed At: [facility] [place], [place] [id number] Rush Farmer MD AY:[id number]    Fungus (Mycology) Culture PENDING  Incomplete   Fungal Source LIVER  Final    Comment: BIOPSY  Fungus Culture Result     Status: None   Collection Time: 01/07/18  5:59 PM  Result Value Ref Range Status   Result 1 Comment  Final    Comment: (NOTE) KOH/Calcofluor preparation:  no fungus observed. Performed At: [facility] [place], [place] [id number] Rush Farmer MD RK:[id number]   Culture, blood (routine x 2)     Status: None (Preliminary result)   Collection Time: 01/15/18  9:17 AM  Result Value Ref Range Status   Specimen Description   Final    BLOOD RIGHT ARM Performed at [facility], [place] [address], [place], [place] [address]    Special Requests   Final    BOTTLES DRAWN AEROBIC AND ANAEROBIC Blood Culture adequate  volume Performed at [facility], [place] [address] [place], [place] [address]    Culture   Final    NO GROWTH 2 DAYS Performed  at [place] [address], [place], [place] [address]    Report Status PENDING  Incomplete  Culture, blood (routine x 2)     Status: None (Preliminary result)   Collection Time: 01/15/18  9:18 AM  Result Value Ref Range Status   Specimen Description   Final    BLOOD LEFT HAND Performed at [place] [address] [place], [place] [address]    Special Requests   Final    BOTTLES DRAWN AEROBIC ONLY Blood Culture adequate volume Performed at [facility] [address], [place], [place] [address]    Culture   Final    NO GROWTH 2 DAYS Performed at [place] [address] [place], [place] [address]    Report Status PENDING  Incomplete    RADIOLOGY STUDIES/RESULTS: Dg Chest 2 View  Result Date: 12/31/2017 CLINICAL DATA:  Nipple marker request EXAM: CHEST  2 VIEW COMPARISON:  1100 hours FINDINGS: Left basilar nodular density corresponds to the nipple marker. Allowing for nipple shadows, lungs are clear other than minimal subsegmental atelectasis for scarring at the left base. Lungs are clear. No pneumothorax or pleural effusion. Mild cardiomegaly. Normal vascularity. IMPRESSION: No active cardiopulmonary disease. Electronically Signed   By: Marybelle Killings M.D.   On: 12/31/2017 19:13   Ct Head Wo Contrast  Result Date: 01/05/2018 CLINICAL DATA:  42 year old female with history of altered level of consciousness. Fall this morning. Possible seizure. EXAM: CT HEAD WITHOUT CONTRAST TECHNIQUE: Contiguous axial images were obtained from the base of the skull through the vertex without intravenous contrast. COMPARISON:  Head CT 03/10/2012. FINDINGS: Brain: No evidence of acute infarction, hemorrhage, hydrocephalus, extra-axial collection or mass lesion/mass effect. Vascular: No hyperdense vessel or unexpected calcification. Skull: Normal. Negative for fracture or focal lesion. Sinuses/Orbits: No acute finding. Other: None. IMPRESSION: 1.  No acute intracranial abnormalities. The appearance of the brain is normal. Electronically Signed   By: Vinnie Langton M.D.   On: 01/05/2018 11:01   Ct Abdomen Pelvis W Contrast  Result Date: 01/06/2018 CLINICAL DATA:  Abdominal distension with elevated lactate EXAM: CT ABDOMEN AND PELVIS WITH CONTRAST TECHNIQUE: Multidetector CT imaging of the abdomen and pelvis was performed using the standard protocol following bolus administration of intravenous contrast. CONTRAST:  135m ISOVUE-300 IOPAMIDOL (ISOVUE-300) INJECTION 61% COMPARISON:  MRI 12/31/2017, CT abdomen pelvis 12/30/2017, 10/23/2017, 10/21/2017, 10/13/2017, 10/16/2016 FINDINGS: Lower chest: Lung bases demonstrate moderate pleural effusions, increased compared to prior. Partial consolidation in the lower lobes, atelectasis versus pneumonia. Mild cardiomegaly. Mild distal esophageal and GE junction thickening. Hepatobiliary: No focal hepatic abnormality. Surgical clips at the gallbladder fossa. No biliary dilatation Pancreas: Decreased pancreatic edema.  No ductal dilatation Spleen: Interval enlargement of the spleen now measuring 14 cm on coronal views. Adrenals/Urinary Tract: Adrenal glands are unremarkable. Kidneys are normal, without renal calculi, focal lesion, or hydronephrosis. Bladder is unremarkable. Stomach/Bowel: Stomach is unremarkable. Fluid-filled loops of small bowel within the abdomen. No significant wall thickening. No intramural air. Normal appendix. No colon wall thickening. Vascular/Lymphatic: Nonaneurysmal aorta. Patent portal vein. Non enhancement of the splenic vein consistent with occlusion, new since the comparison CT. Enlarged retroperitoneal lymph nodes as before. Reproductive: Uterus and bilateral adnexa are unremarkable. Other: Large volume of ascites, increased compared to prior. No free air. Musculoskeletal:  Degenerative changes. No acute or suspicious abnormality. IMPRESSION: 1. Moderate bilateral pleural effusions,  increased compared to prior. Partial consolidations in the bilateral lower lobes may reflect atelectasis or pneumonia 2. 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 Portal vein remains patent. Interval enlargement of the spleen. 3. Decreased pancreatic edema. Large volume of ascites in the abdomen and pelvis, increased compared to prior. 4. Fluid-filled loops of central small bowel, possible ileus. No intramural air or portal venous gas. 5. Stable retroperitoneal lymphadenopathy Electronically Signed   By: Donavan Foil M.D.   On: 01/06/2018 23:28   Ct Abdomen Pelvis W Contrast  Result Date: 12/30/2017 CLINICAL DATA:  Generalized abdominal pain, vomiting and hematuria. HIV positive. Elevated lipase. Prior cholecystectomy. EXAM: CT ABDOMEN AND PELVIS WITH CONTRAST TECHNIQUE: Multidetector CT imaging of the abdomen and pelvis was performed using the standard protocol following bolus administration of intravenous contrast. CONTRAST:  80 cc Isovue-300 IV. COMPARISON:  10/21/2017 CT abdomen/pelvis. FINDINGS: Lower chest: No significant pulmonary nodules or acute consolidative airspace disease. Hepatobiliary: Normal liver size. No liver mass. Cholecystectomy. No biliary ductal dilatation. Common bile duct diameter 5 mm. Pancreas: There is acute diffuse pancreatic enlargement with prominent pancreatic and peripancreatic edema with ill-defined peripancreatic fluid extending into the bilateral anterior paranephric retroperitoneal spaces and into the lesser sac, compatible with acute pancreatitis. No regions of pancreatic parenchymal nonenhancement or gas. No pancreatic mass or duct dilation. No measurable peripancreatic fluid collections. Spleen: Normal size. No mass. Adrenals/Urinary Tract: Normal adrenals. Normal kidneys with no hydronephrosis and no renal mass. Normal bladder. Stomach/Bowel: Normal non-distended stomach. Normal caliber small bowel with no small  bowel wall thickening. Appendix. Normal large bowel with no diverticulosis, large bowel wall thickening or pericolonic fat stranding. Vascular/Lymphatic: Normal caliber abdominal aorta. Patent portal, splenic, hepatic and renal veins. Stable mildly enlarged 1.0 cm right retrocrural node (series 2/image 15). Stable mildly enlarged 1.2 cm porta hepatis node (series 2/image 22). Stable mild left para-aortic adenopathy measuring up to 1.4 cm (series 2/image 31). No new pathologically enlarged abdominopelvic nodes. Reproductive: Grossly normal uterus.  No adnexal mass. Other: Small volume ascites. No pneumoperitoneum. No focal fluid collection. Musculoskeletal: No aggressive appearing focal osseous lesions. IMPRESSION: 1. Acute pancreatitis diffusely involving the pancreas, without complication. 2. Small volume ascites. 3. Chronic porta hepatis and retroperitoneal adenopathy is stable and probably reactive. Electronically Signed   By: Ilona Sorrel M.D.   On: 12/30/2017 02:45   Ir Venogram Hepatic W Hemodynamic Evaluation  Result Date: 01/07/2018 INDICATION: 42 year old female with a complex medical history including markedly abnormal liver enzymes. Request for liver biopsy. Patient has thrombocytopenia and received platelets prior to the procedure. EXAM: TRANSJUGULAR LIVER BIOPSY WITH FLUOROSCOPY HEPATIC VENOGRAPHY WITH PRESSURES ULTRASOUND GUIDANCE FOR VASCULAR ACCESS MEDICATIONS: None. ANESTHESIA/SEDATION: Moderate (conscious) sedation was employed during this procedure. A total of Versed 1 mg and Fentanyl 25 mcg was administered intravenously. Moderate Sedation Time: 41 minutes. The patient's level of consciousness and vital signs were monitored continuously by radiology nursing throughout the procedure under my direct supervision. FLUOROSCOPY TIME:  Fluoroscopy Time: 7 minutes and 54 seconds. 161 mGy COMPLICATIONS: None immediate. PROCEDURE: Informed written consent was obtained from the patient after a thorough  discussion of the procedural risks, benefits and alternatives. All questions were addressed. Maximal Sterile Barrier Technique was utilized including caps, mask, sterile gowns, sterile gloves, sterile drape, hand hygiene and skin antiseptic. A timeout was performed prior to the initiation of the procedure. Ultrasound confirmed a patent  right internal jugular vein. The right side of the neck was prepped and draped in a sterile fashion. Skin was anesthetized with 1% lidocaine. 21 gauge needle directed into the right internal jugular vein with ultrasound guidance. Micropuncture dilator set was placed. Five French catheter was advanced into the IVC and a Bentson wire was placed. The tract was dilated to accommodate a 10 [place] sheath. 5 French catheter was advanced into a right hepatic vein. Hepatic venography was performed. Wedged and free hepatic vein pressures were obtained. The transjugular biopsy kit was then advanced through the sheath over a stiff Amplatz wire. Total of 5 core biopsies were performed. Four adequate core biopsies were obtained. Three specimens placed in formalin for pathology. One specimen was placed in saline for microbiology. Follow-up venography was performed through the 5 French catheter at of the procedure. Sheath were removed with manual compression. Fluoroscopic and ultrasound images were taken and saved for documentation. FINDINGS: Two sets of hepatic venous pressures were obtained. The mean gradient on the first set of pressures was 7. The gradient on the second set of pressures was 6-7. Findings are suggestive for an elevated hepatic venous pressure gradient and concerning for portal hypertension. Core biopsies obtained from the right hepatic lobe. Four adequate specimens were obtained. No significant bleeding or extravasation on the final hepatic venography. IMPRESSION: Successful transjugular liver biopsy. Specimens sent to pathology and microbiology. Elevated hepatic venous pressure  gradient. Electronically Signed   By: Markus Daft M.D.   On: 01/07/2018 18:17   Ir Transcatheter Bx  Result Date: 01/07/2018 INDICATION: 42 year old female with a complex medical history including markedly abnormal liver enzymes. Request for liver biopsy. Patient has thrombocytopenia and received platelets prior to the procedure. EXAM: TRANSJUGULAR LIVER BIOPSY WITH FLUOROSCOPY HEPATIC VENOGRAPHY WITH PRESSURES ULTRASOUND GUIDANCE FOR VASCULAR ACCESS MEDICATIONS: None. ANESTHESIA/SEDATION: Moderate (conscious) sedation was employed during this procedure. A total of Versed 1 mg and Fentanyl 25 mcg was administered intravenously. Moderate Sedation Time: 41 minutes. The patient's level of consciousness and vital signs were monitored continuously by radiology nursing throughout the procedure under my direct supervision. FLUOROSCOPY TIME:  Fluoroscopy Time: 7 minutes and 54 seconds. 751 mGy COMPLICATIONS: None immediate. PROCEDURE: Informed written consent was obtained from the patient after a thorough discussion of the procedural risks, benefits and alternatives. All questions were addressed. Maximal Sterile Barrier Technique was utilized including caps, mask, sterile gowns, sterile gloves, sterile drape, hand hygiene and skin antiseptic. A timeout was performed prior to the initiation of the procedure. Ultrasound confirmed a patent right internal jugular vein. The right side of the neck was prepped and draped in a sterile fashion. Skin was anesthetized with 1% lidocaine. 21 gauge needle directed into the right internal jugular vein with ultrasound guidance. Micropuncture dilator set was placed. Five French catheter was advanced into the IVC and a Bentson wire was placed. The tract was dilated to accommodate a 10 [place] sheath. 5 French catheter was advanced into a right hepatic vein. Hepatic venography was performed. Wedged and free hepatic vein pressures were obtained. The transjugular biopsy kit was then advanced  through the sheath over a stiff Amplatz wire. Total of 5 core biopsies were performed. Four adequate core biopsies were obtained. Three specimens placed in formalin for pathology. One specimen was placed in saline for microbiology. Follow-up venography was performed through the 5 French catheter at of the procedure. Sheath were removed with manual compression. Fluoroscopic and ultrasound images were taken and saved for documentation. FINDINGS: Two sets of hepatic  venous pressures were obtained. The mean gradient on the first set of pressures was 7. The gradient on the second set of pressures was 6-7. Findings are suggestive for an elevated hepatic venous pressure gradient and concerning for portal hypertension. Core biopsies obtained from the right hepatic lobe. Four adequate specimens were obtained. No significant bleeding or extravasation on the final hepatic venography. IMPRESSION: Successful transjugular liver biopsy. Specimens sent to pathology and microbiology. Elevated hepatic venous pressure gradient. Electronically Signed   By: Markus Daft M.D.   On: 01/07/2018 18:17   Mr 3d Recon At Scanner  Result Date: 12/31/2017 CLINICAL DATA:  Abnormal liver function tests. Elevated lipase. HIV. EXAM: MRI ABDOMEN WITHOUT AND WITH CONTRAST (INCLUDING MRCP) TECHNIQUE: Multiplanar multisequence MR imaging of the abdomen was performed both before and after the administration of intravenous contrast. Heavily T2-weighted images of the biliary and pancreatic ducts were obtained, and three-dimensional MRCP images were rendered by post processing. CONTRAST:  28m MULTIHANCE GADOBENATE DIMEGLUMINE 529 MG/ML IV SOLN COMPARISON:  CT 12/30/2017 FINDINGS: Lower chest:  Lung bases are clear. Hepatobiliary: No intrahepatic duct dilatation. No focal hepatic lesion. No enhancing hepatic lesion. Postcholecystectomy. Common bile duct normal caliber. Pancreas: Mild edema associated with the pancreas. Pancreatic duct is normal caliber. No  organized fluid collections. Small amount fluid along the LEFT gutter. Contrast enhanced imaging demonstrates uniform enhancement of the pancreas Spleen: Normal spleen. Adrenals/urinary tract: Adrenal glands and kidneys are normal. Stomach/Bowel: Stomach and limited of the small bowel is unremarkable Vascular/Lymphatic: There are enlarged lymph nodes the retroperitoneum along the aorta the kidneys. For example 12 mm and 8 mm short axis lymph nodes LEFT aorta on image 67, series 12/19/2001 Musculoskeletal: No aggressive osseous lesion IMPRESSION: 1. Mild pancreatic edema associated with pancreatitis. 2. Moderate intraperitoneal free fluid. No organized fluid collections. 3. No biliary obstruction.  Postcholecystectomy. 4. Mild retroperitoneal lymphadenopathy favor HIV adenopathy. Electronically Signed   By: SSuzy BouchardM.D.   On: 12/31/2017 17:36   Dg Chest Port 1 View  Result Date: 01/02/2018 CLINICAL DATA:  Fever.  Status post EGD. EXAM: PORTABLE CHEST 1 VIEW COMPARISON:  Chest x-ray dated December 31, 2017. FINDINGS: Stable mild cardiomegaly. Mild interstitial edema. Small bilateral pleural effusions with bibasilar atelectasis. No pneumothorax. No acute osseous abnormality. IMPRESSION: New mild interstitial pulmonary edema and small bilateral pleural effusions. Electronically Signed   By: WTitus DubinM.D.   On: 01/02/2018 10:09   Dg Chest Port 1 View  Result Date: 12/31/2017 CLINICAL DATA:  Shortness of breath, chest congestion, and productive cough. History of HIV, nonsmoker. EXAM: PORTABLE CHEST 1 VIEW COMPARISON:  Chest x-ray and chest CT scan of October 21, 2017 FINDINGS: The lungs are well-expanded. There is no focal infiltrate. There is a rounded approximately 1.5 cm diameter structure projecting over the posterolateral aspect of the left ninth rib which may reflect a nipple shadow. No similar finding is noted on the right however. Some asymmetry of the nipples was noted on the CT scan of  October 21, 2017. The apices appear clear. The heart and pulmonary vascularity are normal. The mediastinum is normal in width. No mediastinal or hilar lymphadenopathy is observed. There is no pleural effusion. The bony thorax is unremarkable. IMPRESSION: No acute infiltrate or definite mass. Presumed prominent nipple shadow on the left. A repeat chest x-ray with nipple markers and a lateral view would be useful to more completely evaluate the thorax in this patient with cryptogenic hemoptysis. These findings were discussed by telephone with Dr. PFlorene Glen  Electronically Signed   By: David  Martinique M.D.   On: 12/31/2017 13:27   Dg Esophagus Dilation  Result Date: 01/14/2018 ESOPHAGEAL DILATATION: Fluoroscopy was provided for use by the requesting physician.  No images were obtained for radiographic interpretation.  Mr Abdomen Mrcp Moise Boring Contast  Result Date: 12/31/2017 CLINICAL DATA:  Abnormal liver function tests. Elevated lipase. HIV. EXAM: MRI ABDOMEN WITHOUT AND WITH CONTRAST (INCLUDING MRCP) TECHNIQUE: Multiplanar multisequence MR imaging of the abdomen was performed both before and after the administration of intravenous contrast. Heavily T2-weighted images of the biliary and pancreatic ducts were obtained, and three-dimensional MRCP images were rendered by post processing. CONTRAST:  71m MULTIHANCE GADOBENATE DIMEGLUMINE 529 MG/ML IV SOLN COMPARISON:  CT 12/30/2017 FINDINGS: Lower chest:  Lung bases are clear. Hepatobiliary: No intrahepatic duct dilatation. No focal hepatic lesion. No enhancing hepatic lesion. Postcholecystectomy. Common bile duct normal caliber. Pancreas: Mild edema associated with the pancreas. Pancreatic duct is normal caliber. No organized fluid collections. Small amount fluid along the LEFT gutter. Contrast enhanced imaging demonstrates uniform enhancement of the pancreas Spleen: Normal spleen. Adrenals/urinary tract: Adrenal glands and kidneys are normal. Stomach/Bowel: Stomach and  limited of the small bowel is unremarkable Vascular/Lymphatic: There are enlarged lymph nodes the retroperitoneum along the aorta the kidneys. For example 12 mm and 8 mm short axis lymph nodes LEFT aorta on image 67, series 12/19/2001 Musculoskeletal: No aggressive osseous lesion IMPRESSION: 1. Mild pancreatic edema associated with pancreatitis. 2. Moderate intraperitoneal free fluid. No organized fluid collections. 3. No biliary obstruction.  Postcholecystectomy. 4. Mild retroperitoneal lymphadenopathy favor HIV adenopathy. Electronically Signed   By: SSuzy BouchardM.D.   On: 12/31/2017 17:36     LOS: 18 days   SOren Binet MD  [facility] Pager:[phone] If 7PM-7AM, please contact night-coverage [email or web address] Password [email or web address]01/2018, 12:04 PM

## 2018-01-17 NOTE — Progress Notes (Signed)
The patient has no complaints today. She is sitting up in bed talking on the phone. She has a full breakfast in front of her and is going to eat it. States she is swallowing much better. Dr. Michail Sermon plans to repeat endoscopy with esophageal dilatation again in 2 or 3 weeks. Review of labs show bilirubin has decreased overnight to around 13. This is an improvement. No further changes in management at this time.

## 2018-01-18 ENCOUNTER — Inpatient Hospital Stay (HOSPITAL_COMMUNITY): Payer: Medicaid Other | Admitting: Certified Registered"

## 2018-01-18 LAB — HEPATIC FUNCTION PANEL
ALT: 74 U/L — ABNORMAL HIGH (ref 14–54)
AST: 78 U/L — ABNORMAL HIGH (ref 15–41)
Albumin: 1.7 g/dL — ABNORMAL LOW (ref 3.5–5.0)
Alkaline Phosphatase: 399 U/L — ABNORMAL HIGH (ref 38–126)
BILIRUBIN DIRECT: 5.2 mg/dL — AB (ref 0.1–0.5)
BILIRUBIN INDIRECT: 4.7 mg/dL — AB (ref 0.3–0.9)
TOTAL PROTEIN: 5.6 g/dL — AB (ref 6.5–8.1)
Total Bilirubin: 9.9 mg/dL — ABNORMAL HIGH (ref 0.3–1.2)

## 2018-01-18 LAB — GLUCOSE, CAPILLARY
GLUCOSE-CAPILLARY: 95 mg/dL (ref 65–99)
GLUCOSE-CAPILLARY: 84 mg/dL (ref 65–99)
Glucose-Capillary: 146 mg/dL — ABNORMAL HIGH (ref 65–99)
Glucose-Capillary: 138 mg/dL — ABNORMAL HIGH (ref 65–99)

## 2018-01-18 LAB — PROTIME-INR
INR: 0.9
PROTHROMBIN TIME: 12.1 s (ref 11.4–15.2)

## 2018-01-18 LAB — BASIC METABOLIC PANEL
ANION GAP: 8 (ref 5–15)
BUN: 11 mg/dL (ref 6–20)
CALCIUM: 7.8 mg/dL — AB (ref 8.9–10.3)
CO2: 26 mmol/L (ref 22–32)
CREATININE: 0.42 mg/dL — AB (ref 0.44–1.00)
Chloride: 93 mmol/L — ABNORMAL LOW (ref 101–111)
GFR calc non Af Amer: >60 mL/min (ref >60)
Glucose, Bld: 119 mg/dL — ABNORMAL HIGH (ref 65–99)
Potassium: 2.7 mmol/L — CL (ref 3.5–5.1)
SODIUM: 127 mmol/L — AB (ref 135–145)

## 2018-01-18 LAB — OSMOLALITY, URINE: OSMOLALITY UR: 262 mosm/kg — AB (ref 300–900)

## 2018-01-18 LAB — MAGNESIUM: MAGNESIUM: 1.6 mg/dL — AB (ref 1.7–2.4)

## 2018-01-18 LAB — URIC ACID: Uric Acid, Serum: 3.6 mg/dL (ref 2.3–6.6)

## 2018-01-18 LAB — OSMOLALITY: OSMOLALITY: 276 mosm/kg (ref 275–295)

## 2018-01-18 MED ORDER — POTASSIUM CHLORIDE CRYS ER 20 MEQ PO TBCR: 40.0000 meq | EXTENDED_RELEASE_TABLET | Freq: Once | ORAL | Status: DC

## 2018-01-18 MED ORDER — POTASSIUM CHLORIDE 10 MEQ/100ML IV SOLN
10.0000 meq | INTRAVENOUS | Status: AC
  Administered 2018-01-18 (×6): 10 meq via INTRAVENOUS
  Filled 2018-01-18 (×6): qty 100

## 2018-01-18 MED ORDER — MAGNESIUM SULFATE IN D5W 1-5 GM/100ML-% IV SOLN
1.0000 g | Freq: Once | INTRAVENOUS | Status: AC
  Administered 2018-01-18: 1 g via INTRAVENOUS
  Filled 2018-01-18: qty 100

## 2018-01-18 MED ORDER — SODIUM CHLORIDE 0.9 % IV SOLN
INTRAVENOUS | Status: DC
  Administered 2018-01-18: 13:00:00 via INTRAVENOUS

## 2018-01-18 MED ORDER — POTASSIUM CHLORIDE 20 MEQ/15ML (10%) PO SOLN
40.0000 meq | Freq: Two times a day (BID) | ORAL | Status: DC
  Administered 2018-01-18: 40 meq via ORAL
  Filled 2018-01-18 (×2): qty 30

## 2018-01-18 NOTE — Progress Notes (Signed)
The patient is doing well today. She is sitting up in bed eating breakfast. She is eating bacon and grits and doing well at this time with swallowing. She had good improvement of her swallowing after her recent dilatation of the esophageal stricture. Her bilirubin was improving. She thinks she is probably going to go home tomorrow. From a GI standpoint I think this is fine. She will follow-up with Dr. 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 We will sign off at this point. Call us if needed.

## 2018-01-18 NOTE — Progress Notes (Signed)
PROGRESS NOTE        PATIENT DETAILS Name: [name] Age: 42 y.o. Sex: female Date of Birth: [birth year] Admit Date: 12/29/2017 Admitting Physician Norval Morton, MD [id number], Christean Grief, MD   Brief Narrative:   Patient is a 42 y.o. female with history of HIV who presented to the hospital with worsening abdominal pain-felt to be secondary to acute pancreatitis.  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  Hospital course was complicated by development of ITP, dysphagia due to esophageal stricture requiring EGD and dilatation, and massive volume overload.  She slowly continues to improve-see below for further details  Subjective:    Patient in bed, appears comfortable, denies any headache, no fever, no chest pain or pressure, no shortness of breath , no abdominal pain. No focal weakness.   Assessment/Plan:  Acute pancreatitis: Unclear etiology-patient is status post cholecystectomy- no history of alcohol use.  MRCP of the abdomen negative for biliary obstruction/pathology.  Pancreatitis seems to have resolved with supportive care.  Tolerating advancement in diet, GI signed off on 01/18/2018 recommended outpatient follow-up with Dr. Michail Sermon in 2 weeks post discharge.  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  LFTs are improving, INR stable, symptoms have improved, GI has signed off on 01/18/2018 as above, will await ID to evaluate tomorrow if stable likely discharge tomorrow.  Fever: Occurred 1/31-could be related to worsening hepatitis. Blood cultures on 1/31- so far.  Stable and now afebrile off of antibiotics.    Esophageal stricture: Underwent EGD and dilatation on 1/30-tolerating advancement in diet.  Discharge  outpatient GI follow-up with Dr. Michail Sermon.  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  Hematology started IVIG on 01/04/2018 with improvement in platelet counts, defer further management to oncology.    Hyponatremia:  now sodium level is dropping further with Lasix, hold further Lasix, monitor sodium levels off of Lasix.   Hypokalemia: Repalced will monitor.  Massive volume overload: Secondary to acute illness/hepatitis and severe hypoalbuminemia,  UA was negative for proteinuria.  Her weight had gone up to 250 pounds now down to 232 pounds with Lasix, due to hyponatremia worsening will hold Lasix and monitor off of it, trace to no edema on exam now.    Esophageal candidiasis: Appears to be stable-continue anidulafungin-we will await further recommendations from ID  HIV: poorly controlled-last CD4 count of 8-antiretrovirals on hold due to ongoing hepatitis.  ID plans on restarting an alternative antiretroviral agent over the next few days.  Defer management to ID.  Steroid-induced hyperglycemia: Suspect will continue to improve as steroids are being tapered down-had hypoglycemic episode this morning.  Sinus bradycardia: Continue telemetry monitoring-avoid rate limiting agents asymptomatic.    DVT Prophylaxis: SCD's  Code Status: Full code   Family Communication: None at bedside  Disposition Plan: Progress activity likely discharge in 1-2 days if clinically stable  Antimicrobial agents: Anti-infectives (From admission, onward)   Start     Dose/Rate Route Frequency Ordered Stop   01/07/18 2000  bictegravir-emtricitabine-tenofovir AF (BIKTARVY) 50-200-25 MG per tablet 1 tablet  Status:  Discontinued     1 tablet Oral Daily 01/07/18 1449 01/13/18 1356   01/07/18 1000  ethambutol (MYAMBUTOL) tablet 850 mg  Status:   Discontinued     15 mg/kg  55.2 kg Oral Daily 01/06/18 1739 01/08/18 1420   01/06/18 1800  azithromycin (ZITHROMAX) 500 mg in dextrose 5 % 250 mL IVPB  Status:  Discontinued     500 mg 250 mL/hr over 60 Minutes Intravenous Every 24 hours 01/06/18 1737 01/08/18 1420   01/06/18 1200  bictegravir-emtricitabine-tenofovir AF (BIKTARVY) 50-200-25 MG per tablet 1 tablet  Status:  Discontinued     1 tablet Oral Daily 01/05/18 1626 01/06/18 1741   01/05/18 1500  anidulafungin (ERAXIS) 200 mg in sodium chloride 0.9 % 200 mL IVPB     200 mg 78 mL/hr over 200 Minutes Intravenous Every 24 hours 01/05/18 1331     01/02/18 2200  vancomycin (VANCOCIN) 500 mg in sodium chloride 0.9 % 100 mL IVPB  Status:  Discontinued     500 mg 100 mL/hr over 60 Minutes Intravenous Every 12 hours 01/02/18 1111 01/02/18 1559   01/02/18 1400  ceFEPIme (MAXIPIME) 1 g in dextrose 5 % 50 mL IVPB  Status:  Discontinued     1 g 100 mL/hr over 30 Minutes Intravenous Every 8 hours 01/02/18 1111 01/02/18 1559   01/02/18 1200  vancomycin (VANCOCIN) IVPB 1000 mg/200 mL premix     1,000 mg 200 mL/hr over 60 Minutes Intravenous  Once 01/02/18 1111 01/02/18 1344   12/31/17 2000  dapsone tablet 100 mg  Status:  Discontinued     100 mg Oral Daily 12/31/17 1859 01/03/18 1619   12/31/17 1000  fluconazole (DIFLUCAN) IVPB 100 mg  Status:  Discontinued     100 mg 50 mL/hr over 60 Minutes Intravenous Every 24 hours 12/30/17 1001 12/30/17 1431   12/31/17 1000  fluconazole (DIFLUCAN) IVPB 400 mg  Status:  Discontinued     400 mg 100 mL/hr over 120 Minutes Intravenous Every 24 hours 12/30/17 1431 01/03/18 1111   12/30/17 1100  fluconazole (DIFLUCAN) IVPB 200 mg     200 mg 100 mL/hr over 60 Minutes Intravenous  Once 12/30/17 1001 12/30/17 1220   12/30/17 1000  bictegravir-emtricitabine-tenofovir AF (BIKTARVY) 50-200-25 MG per tablet 1 tablet  Status:  Discontinued     1 tablet Oral Daily 12/30/17 0933 01/05/18 1544      Procedures: 1/30>>  EGD 1/23>> liver biopsy  CONSULTS:  ID, GI and hematology/oncology  Time spent: 25-minutes-Greater than 50% of this time was spent in counseling, explanation of diagnosis, planning of further management, and coordination of care.  MEDICATIONS:  Scheduled Meds: . [START ON 01/19/2018] dexamethasone  4 mg Oral Q breakfast   Followed by  . [START ON 01/20/2018] dexamethasone  2 mg Oral Q breakfast   Followed by  . [START ON 01/21/2018] dexamethasone  1 mg Oral Q breakfast   Followed by  . [START ON 01/22/2018] dexamethasone  0.5 mg Oral Q breakfast  . feeding supplement  1 Container Oral TID BM  . folic acid  2 mg Oral Daily  . furosemide  40 mg Intravenous Daily  . insulin aspart  0-9 Units Subcutaneous TID WC  . iopamidol  30 mL Oral Once  . multivitamin with minerals  1 tablet Oral Daily  . pantoprazole (PROTONIX) IV  40 mg Intravenous Q12H  . potassium chloride  40 mEq Oral BID   Continuous Infusions: . anidulafungin Stopped (01/17/18 2152)  . IMMUNE GLOBULIN  10% (HUMAN) IV - For Fluid Restriction Only 30 g (01/17/18 1234)  . potassium chloride 10 mEq (01/18/18 1046)   PRN Meds:.albuterol, hydrALAZINE, hyoscyamine, magic mouthwash w/lidocaine, ondansetron **OR** ondansetron (ZOFRAN) IV, oxyCODONE   PHYSICAL EXAM: Vital signs: Vitals:   01/17/18 1400 01/17/18 1605 01/17/18 2101 01/18/18 0549  BP: 130/72 (!) 142/79 (!) 141/84 (!) 154/85  Pulse: 80 (!) 53 60 72  Resp: _0 Temp: (!) 97.3 F (36.3 C) 97.7 F (36.5 C) 97.6 F (36.4 C) 98.4 F (36.9 C)  TempSrc: Oral Oral Oral Oral  SpO2: 100% 100% 97% 99%  Weight:      Height:       Filed Weights   01/14/18 0456 01/16/18 2030 01/17/18 0500  Weight: 73.6 kg (162 lb 4.1 oz) 58.7 kg (129 lb 4.8 oz) 59.9 kg (132 lb 0.9 oz)   Body mass index is 17.42 kg/m.   Exam  Awake Alert, Oriented X 3, No new F.N deficits, Normal affect Ester.AT,PERRAL Supple Neck,No JVD, No cervical lymphadenopathy appriciated.  Symmetrical  Chest wall movement, Good air movement bilaterally, CTAB RRR,No Gallops, Rubs or new Murmurs, No Parasternal Heave +ve B.Sounds, Abd Soft, No tenderness, No organomegaly appriciated, No rebound - guarding or rigidity. No Cyanosis, Clubbing or edema, No new Rash or bruise   I have personally reviewed following labs and imaging studies  LABORATORY DATA:  CBC: Recent Labs  Lab 01/12/18 0601 01/13/18 1740 01/14/18 8144 01/15/18 0641 01/16/18 0603 01/17/18 0652 01/18/18 0625  WBC 4.3 2.9* 2.8* 2.7* 3.9* 3.6* 3.4*  NEUTROABS 3.9 2.8 2.5 2.3  --   --   --   HGB 13.7 11.8* 11.9* 12.7 10.8* 9.7* 10.3*  HCT 38.4 32.9* 34.1* 35.4* 29.3* 27.3* 28.4*  MCV 80.0 79.7 80.8 81.4 78.3 80.5 79.1  PLT 71* 57* 65* 59* 43* 55* 88*    Basic Metabolic Panel: Recent Labs  Lab 01/13/18 0821 01/14/18 0624 01/15/18 0641 01/16/18 0603 01/17/18 0652 01/18/18 0625  NA 128* 134* 125* 131* 130* 127*  K 3.2* 3.2* 3.3* 2.9* 2.9* 2.7*  CL 103 107 94* 96* 95* 93*  CO2 18* _1 GLUCOSE 207* 74 98 180* 129* 119*  BUN _2 CREATININE 0.32* 0.33* <0.30* <0.30* <0.30* 0.42*  CALCIUM 7.5* 7.9* 7.7* 8.3* 8.1* 7.8*  MG 1.4*  --   --   --  1.4* 1.6*    GFR: Estimated Creatinine Clearance: 88.4 mL/min (A) (by C-G formula based on SCr of 0.42 mg/dL (L)).  Liver Function Tests: Recent Labs  Lab 01/14/18 0624 01/15/18 0641 01/16/18 0603 01/17/18 0652 01/18/18 0625  AST 111* 138* 130* 89* 78*  ALT 82* 91* 95* 80* 74*  ALKPHOS 448* 448* 424* 423* 399*  BILITOT 10.7* 17.0* 18.0* 13.0* 9.9*  PROT 4.7* 4.6* 5.1* 5.3* 5.6*  ALBUMIN 1.8* 1.7* 2.0* 1.8* 1.7*   Recent Labs  Lab 01/12/18 0601  LIPASE 42   No results for input(s): AMMONIA in the last 168 hours.  Coagulation Profile: Recent Labs  Lab 01/14/18 0624 01/15/18 0641 01/16/18 0603 01/17/18 0652 01/18/18 0625  INR 0.94 1.01 1.03 0.97 0.90    CBG: Recent Labs  Lab 01/16/18 2148 01/17/18 0751 01/17/18 1212  01/17/18 1713 01/18/18 0751  GLUCAP 143* 111* 167* 198* 95    Lipid Profile: No results for input(s): CHOL, HDL, LDLCALC, TRIG, CHOLHDL, LDLDIRECT in the last 72 hours.  Thyroid Function Tests: No results for input(s):  TSH, T4TOTAL, FREET4, T3FREE, THYROIDAB in the last 72 hours.   MICROBIOLOGY:  Recent Results (from the past 240 hour(s))  Culture, blood (routine x 2)     Status: None (Preliminary result)   Collection Time: 01/15/18  9:17 AM  Result Value Ref Range Status   Specimen Description   Final    BLOOD RIGHT ARM Performed at [facility] [address] [place], [place] [address]    Special Requests   Final    BOTTLES DRAWN AEROBIC AND ANAEROBIC Blood Culture adequate volume Performed at [facility] [address] [place], [place] [address]    Culture   Final    NO GROWTH 2 DAYS Performed at [facility] [address] [place], [place] [address]    Report Status PENDING  Incomplete  Culture, blood (routine x 2)     Status: None (Preliminary result)   Collection Time: 01/15/18  9:18 AM  Result Value Ref Range Status   Specimen Description   Final    BLOOD LEFT HAND Performed at [facility] [address], [place], [place] [address]    Special Requests   Final    BOTTLES DRAWN AEROBIC ONLY Blood Culture adequate volume Performed at [facility] [address], [place], [place] [address]    Culture   Final    NO GROWTH 2 DAYS Performed at [facility] [address], [place], [place] [address]    Report Status PENDING  Incomplete    RADIOLOGY STUDIES/RESULTS:  Dg Chest 2 View  Result Date: 12/31/2017 CLINICAL DATA:  Nipple marker request EXAM: CHEST  2 VIEW COMPARISON:  1100 hours FINDINGS: Left basilar nodular density corresponds to the nipple marker. Allowing for nipple shadows, lungs are clear other than minimal subsegmental atelectasis for scarring at the  left base. Lungs are clear. No pneumothorax or pleural effusion. Mild cardiomegaly. Normal vascularity. IMPRESSION: No active cardiopulmonary disease. Electronically Signed   By: Marybelle Killings M.D.   On: 12/31/2017 19:13   Ct Head Wo Contrast  Result Date: 01/05/2018 CLINICAL DATA:  42 year old female with history of altered level of consciousness. Fall this morning. Possible seizure. EXAM: CT HEAD WITHOUT CONTRAST TECHNIQUE: Contiguous axial images were obtained from the base of the skull through the vertex without intravenous contrast. COMPARISON:  Head CT 03/10/2012. FINDINGS: Brain: No evidence of acute infarction, hemorrhage, hydrocephalus, extra-axial collection or mass lesion/mass effect. Vascular: No hyperdense vessel or unexpected calcification. Skull: Normal. Negative for fracture or focal lesion. Sinuses/Orbits: No acute finding. Other: None. IMPRESSION: 1. No acute intracranial abnormalities. The appearance of the brain is normal. Electronically Signed   By: Vinnie Langton M.D.   On: 01/05/2018 11:01   Ct Abdomen Pelvis W Contrast  Result Date: 01/06/2018 CLINICAL DATA:  Abdominal distension with elevated lactate EXAM: CT ABDOMEN AND PELVIS WITH CONTRAST TECHNIQUE: Multidetector CT imaging of the abdomen and pelvis was performed using the standard protocol following bolus administration of intravenous contrast. CONTRAST:  180m ISOVUE-300 IOPAMIDOL (ISOVUE-300) INJECTION 61% COMPARISON:  MRI 12/31/2017, CT abdomen pelvis 12/30/2017, 10/23/2017, 10/21/2017, 10/13/2017, 10/16/2016 FINDINGS: Lower chest: Lung bases demonstrate moderate pleural effusions, increased compared to prior. Partial consolidation in the lower lobes, atelectasis versus pneumonia. Mild cardiomegaly. Mild distal esophageal and GE junction thickening. Hepatobiliary: No focal hepatic abnormality. Surgical clips at the gallbladder fossa. No biliary dilatation Pancreas: Decreased pancreatic edema.  No ductal dilatation Spleen:  Interval enlargement of the spleen now measuring  14 cm on coronal views. Adrenals/Urinary Tract: Adrenal glands are unremarkable. Kidneys are normal, without renal calculi, focal lesion, or hydronephrosis. Bladder is unremarkable. Stomach/Bowel: Stomach is unremarkable. Fluid-filled loops of small bowel within the abdomen. No significant wall thickening. No intramural air. Normal appendix. No colon wall thickening. Vascular/Lymphatic: Nonaneurysmal aorta. Patent portal vein. Non enhancement of the splenic vein consistent with occlusion, new since the comparison CT. Enlarged retroperitoneal lymph nodes as before. Reproductive: Uterus and bilateral adnexa are unremarkable. Other: Large volume of ascites, increased compared to prior. No free air. Musculoskeletal: Degenerative changes. No acute or suspicious abnormality. IMPRESSION: 1. Moderate bilateral pleural effusions, increased compared to prior. Partial consolidations in the bilateral lower lobes may reflect atelectasis or pneumonia 2. 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 Portal vein remains patent. Interval enlargement of the spleen. 3. Decreased pancreatic edema. Large volume of ascites in the abdomen and pelvis, increased compared to prior. 4. Fluid-filled loops of central small bowel, possible ileus. No intramural air or portal venous gas. 5. Stable retroperitoneal lymphadenopathy Electronically Signed   By: Donavan Foil M.D.   On: 01/06/2018 23:28   Ct Abdomen Pelvis W Contrast  Result Date: 12/30/2017 CLINICAL DATA:  Generalized abdominal pain, vomiting and hematuria. HIV positive. Elevated lipase. Prior cholecystectomy. EXAM: CT ABDOMEN AND PELVIS WITH CONTRAST TECHNIQUE: Multidetector CT imaging of the abdomen and pelvis was performed using the standard protocol following bolus administration of intravenous contrast. CONTRAST:  80 cc Isovue-300 IV. COMPARISON:  10/21/2017 CT abdomen/pelvis.  FINDINGS: Lower chest: No significant pulmonary nodules or acute consolidative airspace disease. Hepatobiliary: Normal liver size. No liver mass. Cholecystectomy. No biliary ductal dilatation. Common bile duct diameter 5 mm. Pancreas: There is acute diffuse pancreatic enlargement with prominent pancreatic and peripancreatic edema with ill-defined peripancreatic fluid extending into the bilateral anterior paranephric retroperitoneal spaces and into the lesser sac, compatible with acute pancreatitis. No regions of pancreatic parenchymal nonenhancement or gas. No pancreatic mass or duct dilation. No measurable peripancreatic fluid collections. Spleen: Normal size. No mass. Adrenals/Urinary Tract: Normal adrenals. Normal kidneys with no hydronephrosis and no renal mass. Normal bladder. Stomach/Bowel: Normal non-distended stomach. Normal caliber small bowel with no small bowel wall thickening. Appendix. Normal large bowel with no diverticulosis, large bowel wall thickening or pericolonic fat stranding. Vascular/Lymphatic: Normal caliber abdominal aorta. Patent portal, splenic, hepatic and renal veins. Stable mildly enlarged 1.0 cm right retrocrural node (series 2/image 15). Stable mildly enlarged 1.2 cm porta hepatis node (series 2/image 22). Stable mild left para-aortic adenopathy measuring up to 1.4 cm (series 2/image 31). No new pathologically enlarged abdominopelvic nodes. Reproductive: Grossly normal uterus.  No adnexal mass. Other: Small volume ascites. No pneumoperitoneum. No focal fluid collection. Musculoskeletal: No aggressive appearing focal osseous lesions. IMPRESSION: 1. Acute pancreatitis diffusely involving the pancreas, without complication. 2. Small volume ascites. 3. Chronic porta hepatis and retroperitoneal adenopathy is stable and probably reactive. Electronically Signed   By: Ilona Sorrel M.D.   On: 12/30/2017 02:45   Ir Venogram Hepatic W Hemodynamic Evaluation  Result Date:  01/07/2018 INDICATION: 42 year old female with a complex medical history including markedly abnormal liver enzymes. Request for liver biopsy. Patient has thrombocytopenia and received platelets prior to the procedure. EXAM: TRANSJUGULAR LIVER BIOPSY WITH FLUOROSCOPY HEPATIC VENOGRAPHY WITH PRESSURES ULTRASOUND GUIDANCE FOR VASCULAR ACCESS MEDICATIONS: None. ANESTHESIA/SEDATION: Moderate (conscious) sedation was employed during this procedure. A total of Versed 1 mg and Fentanyl 25 mcg was administered intravenously. Moderate Sedation Time: 41 minutes. The  patient's level of consciousness and vital signs were monitored continuously by radiology nursing throughout the procedure under my direct supervision. FLUOROSCOPY TIME:  Fluoroscopy Time: 7 minutes and 54 seconds. 268 mGy COMPLICATIONS: None immediate. PROCEDURE: Informed written consent was obtained from the patient after a thorough discussion of the procedural risks, benefits and alternatives. All questions were addressed. Maximal Sterile Barrier Technique was utilized including caps, mask, sterile gowns, sterile gloves, sterile drape, hand hygiene and skin antiseptic. A timeout was performed prior to the initiation of the procedure. Ultrasound confirmed a patent right internal jugular vein. The right side of the neck was prepped and draped in a sterile fashion. Skin was anesthetized with 1% lidocaine. 21 gauge needle directed into the right internal jugular vein with ultrasound guidance. Micropuncture dilator set was placed. Five French catheter was advanced into the IVC and a Bentson wire was placed. The tract was dilated to accommodate a 10 [place] sheath. 5 French catheter was advanced into a right hepatic vein. Hepatic venography was performed. Wedged and free hepatic vein pressures were obtained. The transjugular biopsy kit was then advanced through the sheath over a stiff Amplatz wire. Total of 5 core biopsies were performed. Four adequate core biopsies  were obtained. Three specimens placed in formalin for pathology. One specimen was placed in saline for microbiology. Follow-up venography was performed through the 5 French catheter at of the procedure. Sheath were removed with manual compression. Fluoroscopic and ultrasound images were taken and saved for documentation. FINDINGS: Two sets of hepatic venous pressures were obtained. The mean gradient on the first set of pressures was 7. The gradient on the second set of pressures was 6-7. Findings are suggestive for an elevated hepatic venous pressure gradient and concerning for portal hypertension. Core biopsies obtained from the right hepatic lobe. Four adequate specimens were obtained. No significant bleeding or extravasation on the final hepatic venography. IMPRESSION: Successful transjugular liver biopsy. Specimens sent to pathology and microbiology. Elevated hepatic venous pressure gradient. Electronically Signed   By: Markus Daft M.D.   On: 01/07/2018 18:17   Ir Transcatheter Bx  Result Date: 01/07/2018 INDICATION: 42 year old female with a complex medical history including markedly abnormal liver enzymes. Request for liver biopsy. Patient has thrombocytopenia and received platelets prior to the procedure. EXAM: TRANSJUGULAR LIVER BIOPSY WITH FLUOROSCOPY HEPATIC VENOGRAPHY WITH PRESSURES ULTRASOUND GUIDANCE FOR VASCULAR ACCESS MEDICATIONS: None. ANESTHESIA/SEDATION: Moderate (conscious) sedation was employed during this procedure. A total of Versed 1 mg and Fentanyl 25 mcg was administered intravenously. Moderate Sedation Time: 41 minutes. The patient's level of consciousness and vital signs were monitored continuously by radiology nursing throughout the procedure under my direct supervision. FLUOROSCOPY TIME:  Fluoroscopy Time: 7 minutes and 54 seconds. 341 mGy COMPLICATIONS: None immediate. PROCEDURE: Informed written consent was obtained from the patient after a thorough discussion of the procedural  risks, benefits and alternatives. All questions were addressed. Maximal Sterile Barrier Technique was utilized including caps, mask, sterile gowns, sterile gloves, sterile drape, hand hygiene and skin antiseptic. A timeout was performed prior to the initiation of the procedure. Ultrasound confirmed a patent right internal jugular vein. The right side of the neck was prepped and draped in a sterile fashion. Skin was anesthetized with 1% lidocaine. 21 gauge needle directed into the right internal jugular vein with ultrasound guidance. Micropuncture dilator set was placed. Five French catheter was advanced into the IVC and a Bentson wire was placed. The tract was dilated to accommodate a 10 [place] sheath. 5 French catheter was advanced  into a right hepatic vein. Hepatic venography was performed. Wedged and free hepatic vein pressures were obtained. The transjugular biopsy kit was then advanced through the sheath over a stiff Amplatz wire. Total of 5 core biopsies were performed. Four adequate core biopsies were obtained. Three specimens placed in formalin for pathology. One specimen was placed in saline for microbiology. Follow-up venography was performed through the 5 French catheter at of the procedure. Sheath were removed with manual compression. Fluoroscopic and ultrasound images were taken and saved for documentation. FINDINGS: Two sets of hepatic venous pressures were obtained. The mean gradient on the first set of pressures was 7. The gradient on the second set of pressures was 6-7. Findings are suggestive for an elevated hepatic venous pressure gradient and concerning for portal hypertension. Core biopsies obtained from the right hepatic lobe. Four adequate specimens were obtained. No significant bleeding or extravasation on the final hepatic venography. IMPRESSION: Successful transjugular liver biopsy. Specimens sent to pathology and microbiology. Elevated hepatic venous pressure gradient. Electronically  Signed   By: Markus Daft M.D.   On: 01/07/2018 18:17   Mr 3d Recon At Scanner  Result Date: 12/31/2017 CLINICAL DATA:  Abnormal liver function tests. Elevated lipase. HIV. EXAM: MRI ABDOMEN WITHOUT AND WITH CONTRAST (INCLUDING MRCP) TECHNIQUE: Multiplanar multisequence MR imaging of the abdomen was performed both before and after the administration of intravenous contrast. Heavily T2-weighted images of the biliary and pancreatic ducts were obtained, and three-dimensional MRCP images were rendered by post processing. CONTRAST:  28m MULTIHANCE GADOBENATE DIMEGLUMINE 529 MG/ML IV SOLN COMPARISON:  CT 12/30/2017 FINDINGS: Lower chest:  Lung bases are clear. Hepatobiliary: No intrahepatic duct dilatation. No focal hepatic lesion. No enhancing hepatic lesion. Postcholecystectomy. Common bile duct normal caliber. Pancreas: Mild edema associated with the pancreas. Pancreatic duct is normal caliber. No organized fluid collections. Small amount fluid along the LEFT gutter. Contrast enhanced imaging demonstrates uniform enhancement of the pancreas Spleen: Normal spleen. Adrenals/urinary tract: Adrenal glands and kidneys are normal. Stomach/Bowel: Stomach and limited of the small bowel is unremarkable Vascular/Lymphatic: There are enlarged lymph nodes the retroperitoneum along the aorta the kidneys. For example 12 mm and 8 mm short axis lymph nodes LEFT aorta on image 67, series 12/19/2001 Musculoskeletal: No aggressive osseous lesion IMPRESSION: 1. Mild pancreatic edema associated with pancreatitis. 2. Moderate intraperitoneal free fluid. No organized fluid collections. 3. No biliary obstruction.  Postcholecystectomy. 4. Mild retroperitoneal lymphadenopathy favor HIV adenopathy. Electronically Signed   By: SSuzy BouchardM.D.   On: 12/31/2017 17:36   Dg Chest Port 1 View  Result Date: 01/02/2018 CLINICAL DATA:  Fever.  Status post EGD. EXAM: PORTABLE CHEST 1 VIEW COMPARISON:  Chest x-ray dated December 31, 2017.  FINDINGS: Stable mild cardiomegaly. Mild interstitial edema. Small bilateral pleural effusions with bibasilar atelectasis. No pneumothorax. No acute osseous abnormality. IMPRESSION: New mild interstitial pulmonary edema and small bilateral pleural effusions. Electronically Signed   By: WTitus DubinM.D.   On: 01/02/2018 10:09   Dg Chest Port 1 View  Result Date: 12/31/2017 CLINICAL DATA:  Shortness of breath, chest congestion, and productive cough. History of HIV, nonsmoker. EXAM: PORTABLE CHEST 1 VIEW COMPARISON:  Chest x-ray and chest CT scan of October 21, 2017 FINDINGS: The lungs are well-expanded. There is no focal infiltrate. There is a rounded approximately 1.5 cm diameter structure projecting over the posterolateral aspect of the left ninth rib which may reflect a nipple shadow. No similar finding is noted on the right however. Some asymmetry of the  nipples was noted on the CT scan of October 21, 2017. The apices appear clear. The heart and pulmonary vascularity are normal. The mediastinum is normal in width. No mediastinal or hilar lymphadenopathy is observed. There is no pleural effusion. The bony thorax is unremarkable. IMPRESSION: No acute infiltrate or definite mass. Presumed prominent nipple shadow on the left. A repeat chest x-ray with nipple markers and a lateral view would be useful to more completely evaluate the thorax in this patient with cryptogenic hemoptysis. These findings were discussed by telephone with Dr. Florene Glen. Electronically Signed   By: David  Martinique M.D.   On: 12/31/2017 13:27   Dg Esophagus Dilation  Result Date: 01/14/2018 ESOPHAGEAL DILATATION: Fluoroscopy was provided for use by the requesting physician.  No images were obtained for radiographic interpretation.  Mr Abdomen Mrcp Moise Boring Contast  Result Date: 12/31/2017 CLINICAL DATA:  Abnormal liver function tests. Elevated lipase. HIV. EXAM: MRI ABDOMEN WITHOUT AND WITH CONTRAST (INCLUDING MRCP) TECHNIQUE: Multiplanar  multisequence MR imaging of the abdomen was performed both before and after the administration of intravenous contrast. Heavily T2-weighted images of the biliary and pancreatic ducts were obtained, and three-dimensional MRCP images were rendered by post processing. CONTRAST:  23m MULTIHANCE GADOBENATE DIMEGLUMINE 529 MG/ML IV SOLN COMPARISON:  CT 12/30/2017 FINDINGS: Lower chest:  Lung bases are clear. Hepatobiliary: No intrahepatic duct dilatation. No focal hepatic lesion. No enhancing hepatic lesion. Postcholecystectomy. Common bile duct normal caliber. Pancreas: Mild edema associated with the pancreas. Pancreatic duct is normal caliber. No organized fluid collections. Small amount fluid along the LEFT gutter. Contrast enhanced imaging demonstrates uniform enhancement of the pancreas Spleen: Normal spleen. Adrenals/urinary tract: Adrenal glands and kidneys are normal. Stomach/Bowel: Stomach and limited of the small bowel is unremarkable Vascular/Lymphatic: There are enlarged lymph nodes the retroperitoneum along the aorta the kidneys. For example 12 mm and 8 mm short axis lymph nodes LEFT aorta on image 67, series 12/19/2001 Musculoskeletal: No aggressive osseous lesion IMPRESSION: 1. Mild pancreatic edema associated with pancreatitis. 2. Moderate intraperitoneal free fluid. No organized fluid collections. 3. No biliary obstruction.  Postcholecystectomy. 4. Mild retroperitoneal lymphadenopathy favor HIV adenopathy. Electronically Signed   By: SSuzy BouchardM.D.   On: 12/31/2017 17:36     LOS: 19 days   Signature  PLala LundM.D on 01/18/2018 at 11:22 AM  Between 7am to 7pm - Pager - [phone]( page via [email or web address], text pages only, please mention full 10 digit call back number).  After 7pm go to [email or web address] - password [email or web address]

## 2018-01-18 NOTE — Progress Notes (Signed)
Alerted MD via text of pt's K+ level this morning. Also, that pt will not take ordered PO K+.

## 2018-01-18 NOTE — Plan of Care (Signed)
  Pain Managment: General experience of comfort will improve 01/18/2018 0118 - Progressing by Mickie Kay, RN

## 2018-01-19 DIAGNOSIS — Z91013 Allergy to seafood: Secondary | ICD-10-CM

## 2018-01-19 DIAGNOSIS — F319 Bipolar disorder, unspecified: Secondary | ICD-10-CM

## 2018-01-19 LAB — COMPREHENSIVE METABOLIC PANEL
ALBUMIN: 1.7 g/dL — AB (ref 3.5–5.0)
ALT: 72 U/L — ABNORMAL HIGH (ref 14–54)
AST: 84 U/L — AB (ref 15–41)
Alkaline Phosphatase: 365 U/L — ABNORMAL HIGH (ref 38–126)
Anion gap: 6 (ref 5–15)
BUN: 10 mg/dL (ref 6–20)
CHLORIDE: 98 mmol/L — AB (ref 101–111)
CO2: 27 mmol/L (ref 22–32)
Calcium: 7.7 mg/dL — ABNORMAL LOW (ref 8.9–10.3)
Creatinine, Ser: 0.42 mg/dL — ABNORMAL LOW (ref 0.44–1.00)
GFR calc Af Amer: >60 mL/min (ref >60)
GLUCOSE: 77 mg/dL (ref 65–99)
POTASSIUM: 3 mmol/L — AB (ref 3.5–5.1)
SODIUM: 131 mmol/L — AB (ref 135–145)
Total Bilirubin: 9.6 mg/dL — ABNORMAL HIGH (ref 0.3–1.2)
Total Protein: 5.7 g/dL — ABNORMAL LOW (ref 6.5–8.1)

## 2018-01-19 LAB — GLUCOSE, CAPILLARY
GLUCOSE-CAPILLARY: 130 mg/dL — AB (ref 65–99)
GLUCOSE-CAPILLARY: 143 mg/dL — AB (ref 65–99)
Glucose-Capillary: 104 mg/dL — ABNORMAL HIGH (ref 65–99)
Glucose-Capillary: 99 mg/dL (ref 65–99)
Glucose-Capillary: 104 mg/dL — ABNORMAL HIGH (ref 65–99)

## 2018-01-19 LAB — CBC
HCT: 28.4 % — ABNORMAL LOW (ref 36.0–46.0)
HEMOGLOBIN: 10.3 g/dL — AB (ref 12.0–15.0)
MCH: 28.7 pg (ref 26.0–34.0)
MCHC: 36.3 g/dL — AB (ref 30.0–36.0)
MCV: 79.1 fL (ref 78.0–100.0)
Platelets: 88 10*3/uL — ABNORMAL LOW (ref 150–400)
RBC: 3.59 MIL/uL — ABNORMAL LOW (ref 3.87–5.11)
RDW: 24.9 % — AB (ref 11.5–15.5)
WBC: 3.4 10*3/uL — AB (ref 4.0–10.5)

## 2018-01-19 LAB — CBC WITH DIFFERENTIAL/PLATELET
Basophils Absolute: 0 10*3/uL (ref 0.0–0.1)
Basophils Relative: 0 %
Eosinophils Absolute: 0 10*3/uL (ref 0.0–0.7)
Eosinophils Relative: 0 %
HCT: 25.7 % — ABNORMAL LOW (ref 36.0–46.0)
Hemoglobin: 9.3 g/dL — ABNORMAL LOW (ref 12.0–15.0)
Lymphocytes Relative: 5 %
Lymphs Abs: 0.1 10*3/uL — ABNORMAL LOW (ref 0.7–4.0)
MCH: 29.2 pg (ref 26.0–34.0)
MCHC: 36.2 g/dL — ABNORMAL HIGH (ref 30.0–36.0)
MCV: 80.8 fL (ref 78.0–100.0)
Monocytes Absolute: 0.2 10*3/uL (ref 0.1–1.0)
Monocytes Relative: 7 %
Neutro Abs: 2.4 10*3/uL (ref 1.7–7.7)
Neutrophils Relative %: 88 %
Platelets: 110 10*3/uL — ABNORMAL LOW (ref 150–400)
RBC: 3.18 MIL/uL — ABNORMAL LOW (ref 3.87–5.11)
RDW: 24.6 % — ABNORMAL HIGH (ref 11.5–15.5)
WBC: 2.7 10*3/uL — ABNORMAL LOW (ref 4.0–10.5)

## 2018-01-19 LAB — SODIUM, URINE, RANDOM: Sodium, Ur: 59 mmol/L

## 2018-01-19 LAB — MAGNESIUM: Magnesium: 1.6 mg/dL — ABNORMAL LOW (ref 1.7–2.4)

## 2018-01-19 LAB — PROTIME-INR
INR: 0.96
Prothrombin Time: 12.7 seconds (ref 11.4–15.2)

## 2018-01-19 MED ORDER — BENZTROPINE MESYLATE 1 MG PO TABS
1.0000 mg | ORAL_TABLET | Freq: Every day | ORAL | Status: DC
  Administered 2018-01-19 – 2018-01-20 (×2): 1 mg via ORAL
  Filled 2018-01-19 (×2): qty 1

## 2018-01-19 MED ORDER — MAGNESIUM SULFATE 2 GM/50ML IV SOLN
2.0000 g | Freq: Once | INTRAVENOUS | Status: AC
  Administered 2018-01-19: 2 g via INTRAVENOUS
  Filled 2018-01-19: qty 50

## 2018-01-19 MED ORDER — POTASSIUM CHLORIDE 10 MEQ/100ML IV SOLN
10.0000 meq | INTRAVENOUS | Status: AC
  Administered 2018-01-19 (×3): 10 meq via INTRAVENOUS
  Filled 2018-01-19 (×3): qty 100

## 2018-01-19 MED ORDER — OLANZAPINE 5 MG PO TABS
5.0000 mg | ORAL_TABLET | Freq: Every day | ORAL | Status: DC
  Administered 2018-01-19: 5 mg via ORAL
  Filled 2018-01-19 (×2): qty 1

## 2018-01-19 MED ORDER — SODIUM CHLORIDE 0.9 % IV SOLN
30.0000 meq | Freq: Once | INTRAVENOUS | Status: DC
  Filled 2018-01-19: qty 15

## 2018-01-19 MED ORDER — POTASSIUM CHLORIDE 10 MEQ/100ML IV SOLN
10.0000 meq | INTRAVENOUS | Status: AC
  Administered 2018-01-19 (×6): 10 meq via INTRAVENOUS
  Filled 2018-01-19 (×6): qty 100

## 2018-01-19 NOTE — Progress Notes (Signed)
PROGRESS NOTE        PATIENT DETAILS Name: [name] Age: 42 y.o. Sex: female Date of Birth: 09/14/1976 Admit Date: 12/29/2017 Admitting Physician Norval Morton, MD MMN:[id number], Christean Grief, MD   Brief Narrative:   Patient is a 42 y.o. female with history of HIV who presented to the hospital with worsening abdominal pain-felt to be secondary to acute pancreatitis.  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  Hospital course was complicated by development of ITP, dysphagia due to esophageal stricture requiring EGD and dilatation, and massive volume overload.  She slowly continues to improve-see below for further details  Subjective:    Patient in bed, appears comfortable, denies any headache, no fever, no chest pain or pressure, no shortness of breath , no abdominal pain. No focal weakness.   Assessment/Plan:  Acute pancreatitis: Unclear etiology-patient is status post cholecystectomy- no history of alcohol use.  MRCP of the abdomen negative for biliary  obstruction/pathology.  Pancreatitis seems to have resolved with supportive care.  Tolerating advancement in diet, GI signed off on 01/18/2018 recommended outpatient follow-up with Dr. Michail Sermon in 2 weeks post discharge.  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  LFTs are improving, INR stable, symptoms have improved, GI has signed off on 01/18/2018 as above, discussed with ID physician Dr. Megan Salon who will evaluate the patient today and comment on HIV medications being resumed or not.  Fever: Occurred 1/31-could be related to worsening hepatitis. Blood cultures on 1/31- so far.  Stable and now afebrile off of antibiotics.    Esophageal stricture: Underwent EGD and  dilatation on 1/30-tolerating advancement in diet.  Discharge outpatient GI follow-up with Dr. Michail Sermon.  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  Hematology started IVIG on 01/04/2018 with improvement in platelet counts, defer further management to oncology.    Hyponatremia: Initially responded well to Lasix, thereafter sodium level started dropping with diuresis, will hold off further diuresis, Lasix discontinued 01/18/2018, sodium now improving will continue to monitor.   Hypokalemia & hypomagnesemia: Repalced will monitor.  She had been noncompliant with oral supplementation of potassium.  Massive volume overload: Secondary to acute illness/hepatitis and severe hypoalbuminemia,  UA was negative for proteinuria.  Her weight had gone up to 250 pounds now down to 232 pounds with Lasix, due to hyponatremia worsening will hold Lasix and monitor off of it, trace to no edema on exam now.    Esophageal candidiasis: Appears to be stable-continue anidulafungin-we will await further recommendations from ID  HIV: poorly controlled-last CD4 count of 8-antiretrovirals on hold due to ongoing hepatitis.  ID plans on restarting an alternative antiretroviral agent over the next few days.  Defer management to ID.  Steroid-induced hyperglycemia: Suspect will continue to improve as steroids are being tapered down-had hypoglycemic episode this morning.  Sinus bradycardia: Continue telemetry monitoring-avoid rate limiting agents asymptomatic.  Bipolar - meds resumed upon pt request.    DVT Prophylaxis: SCD's  Code Status: Full code   Family Communication: None at bedside  Disposition Plan: Progress activity likely discharge in 1-2  days if clinically stable  Antimicrobial agents: Anti-infectives (From admission, onward)   Start      Dose/Rate Route Frequency Ordered Stop   01/07/18 2000  bictegravir-emtricitabine-tenofovir AF (BIKTARVY) 50-200-25 MG per tablet 1 tablet  Status:  Discontinued     1 tablet Oral Daily 01/07/18 1449 01/13/18 1356   01/07/18 1000  ethambutol (MYAMBUTOL) tablet 850 mg  Status:  Discontinued     15 mg/kg  55.2 kg Oral Daily 01/06/18 1739 01/08/18 1420   01/06/18 1800  azithromycin (ZITHROMAX) 500 mg in dextrose 5 % 250 mL IVPB  Status:  Discontinued     500 mg 250 mL/hr over 60 Minutes Intravenous Every 24 hours 01/06/18 1737 01/08/18 1420   01/06/18 1200  bictegravir-emtricitabine-tenofovir AF (BIKTARVY) 50-200-25 MG per tablet 1 tablet  Status:  Discontinued     1 tablet Oral Daily 01/05/18 1626 01/06/18 1741   01/05/18 1500  anidulafungin (ERAXIS) 200 mg in sodium chloride 0.9 % 200 mL IVPB     200 mg 78 mL/hr over 200 Minutes Intravenous Every 24 hours 01/05/18 1331     01/02/18 2200  vancomycin (VANCOCIN) 500 mg in sodium chloride 0.9 % 100 mL IVPB  Status:  Discontinued     500 mg 100 mL/hr over 60 Minutes Intravenous Every 12 hours 01/02/18 1111 01/02/18 1559   01/02/18 1400  ceFEPIme (MAXIPIME) 1 g in dextrose 5 % 50 mL IVPB  Status:  Discontinued     1 g 100 mL/hr over 30 Minutes Intravenous Every 8 hours 01/02/18 1111 01/02/18 1559   01/02/18 1200  vancomycin (VANCOCIN) IVPB 1000 mg/200 mL premix     1,000 mg 200 mL/hr over 60 Minutes Intravenous  Once 01/02/18 1111 01/02/18 1344   12/31/17 2000  dapsone tablet 100 mg  Status:  Discontinued     100 mg Oral Daily 12/31/17 1859 01/03/18 1619   12/31/17 1000  fluconazole (DIFLUCAN) IVPB 100 mg  Status:  Discontinued     100 mg 50 mL/hr over 60 Minutes Intravenous Every 24 hours 12/30/17 1001 12/30/17 1431   12/31/17 1000  fluconazole (DIFLUCAN) IVPB 400 mg  Status:  Discontinued     400 mg 100 mL/hr over 120 Minutes Intravenous Every 24 hours 12/30/17 1431 01/03/18 1111   12/30/17 1100  fluconazole (DIFLUCAN) IVPB 200 mg     200  mg 100 mL/hr over 60 Minutes Intravenous  Once 12/30/17 1001 12/30/17 1220   12/30/17 1000  bictegravir-emtricitabine-tenofovir AF (BIKTARVY) 50-200-25 MG per tablet 1 tablet  Status:  Discontinued     1 tablet Oral Daily 12/30/17 0933 01/05/18 1544      Procedures: 1/30>> EGD 1/23>> liver biopsy  CONSULTS:  ID, GI and hematology/oncology  Time spent: 25-minutes-Greater than 50% of this time was spent in counseling, explanation of diagnosis, planning of further management, and coordination of care.  MEDICATIONS:  Scheduled Meds: . benztropine  1 mg Oral Daily  . [START ON 01/20/2018] dexamethasone  2 mg Oral Q breakfast   Followed by  . [START ON 01/21/2018] dexamethasone  1 mg Oral Q breakfast   Followed by  . [START ON 01/22/2018] dexamethasone  0.5 mg Oral Q breakfast  . feeding supplement  1 Container Oral TID BM  . folic acid  2 mg Oral Daily  . insulin aspart  0-9 Units Subcutaneous TID WC  . iopamidol  30 mL Oral Once  . multivitamin with minerals  1 tablet Oral Daily  . OLANZapine  5 mg Oral QHS  .  pantoprazole (PROTONIX) IV  40 mg Intravenous Q12H   Continuous Infusions: . anidulafungin Stopped (01/18/18 1910)  . IMMUNE GLOBULIN 10% (HUMAN) IV - For Fluid Restriction Only 30 g (01/19/18 1139)  . magnesium sulfate 1 - 4 g bolus IVPB    . potassium chloride 10 mEq (01/19/18 1218)  . potassium chloride     PRN Meds:.albuterol, hydrALAZINE, hyoscyamine, magic mouthwash w/lidocaine, [DISCONTINUED] ondansetron **OR** ondansetron (ZOFRAN) IV, oxyCODONE   PHYSICAL EXAM: Vital signs: Vitals:   01/19/18 0422 01/19/18 1158 01/19/18 1218 01/19/18 1235  BP: (!) 144/91 (!) 145/101 (!) 160/102 126/85  Pulse: 78 84 82 97  Resp: 18 17 17 17   Temp: 99.1 F (37.3 C) 99.1 F (37.3 C)  98.8 F (37.1 C)  TempSrc: Oral Oral  Oral  SpO2: 95% 100%  100%  Weight: 61.9 kg (136 lb 7.4 oz)     Height:       Filed Weights   01/17/18 0500 01/18/18 1700 01/19/18 0422  Weight: 59.9 kg  (132 lb 0.9 oz) 61.4 kg (135 lb 5.8 oz) 61.9 kg (136 lb 7.4 oz)   Body mass index is 18 kg/m.   Exam  Awake Alert,  No new F.N deficits, bizarre affect [place].AT,PERRAL Supple Neck,No JVD, No cervical lymphadenopathy appriciated.  Symmetrical Chest wall movement, Good air movement bilaterally, CTAB RRR,No Gallops, Rubs or new Murmurs, No Parasternal Heave +ve B.Sounds, Abd Soft, No tenderness, No organomegaly appriciated, No rebound - guarding or rigidity. No Cyanosis, Clubbing or edema, No new Rash or bruise    I have personally reviewed following labs and imaging studies  LABORATORY DATA:  CBC: Recent Labs  Lab 01/13/18 0821 01/14/18 8563 01/15/18 0641 01/16/18 0603 01/17/18 0652 01/18/18 0625 01/19/18 0634  WBC 2.9* 2.8* 2.7* 3.9* 3.6* 3.4* 2.7*  NEUTROABS 2.8 2.5 2.3  --   --   --  2.4  HGB 11.8* 11.9* 12.7 10.8* 9.7* 10.3* 9.3*  HCT 32.9* 34.1* 35.4* 29.3* 27.3* 28.4* 25.7*  MCV 79.7 80.8 81.4 78.3 80.5 79.1 80.8  PLT 57* 65* 59* 43* 55* 88* 110*    Basic Metabolic Panel: Recent Labs  Lab 01/13/18 0821  01/15/18 0641 01/16/18 0603 01/17/18 0652 01/18/18 0625 01/19/18 0634  NA 128*   < > 125* 131* 130* 127* 131*  K 3.2*   < > 3.3* 2.9* 2.9* 2.7* 3.0*  CL 103   < > 94* 96* 95* 93* 98*  CO2 18*   < > 22 25 26 26 27   GLUCOSE 207*   < > 98 180* 129* 119* 77  BUN 10   < > 8 9 10 11 10   CREATININE 0.32*   < > <0.30* <0.30* <0.30* 0.42* 0.42*  CALCIUM 7.5*   < > 7.7* 8.3* 8.1* 7.8* 7.7*  MG 1.4*  --   --   --  1.4* 1.6* 1.6*   < > = values in this interval not displayed.    GFR: Estimated Creatinine Clearance: 91.3 mL/min (A) (by C-G formula based on SCr of 0.42 mg/dL (L)).  Liver Function Tests: Recent Labs  Lab 01/15/18 0641 01/16/18 0603 01/17/18 1497 01/18/18 0625 01/19/18 0634  AST 138* 130* 89* 78* 84*  ALT 91* 95* 80* 74* 72*  ALKPHOS 448* 424* 423* 399* 365*  BILITOT 17.0* 18.0* 13.0* 9.9* 9.6*  PROT 4.6* 5.1* 5.3* 5.6* 5.7*  ALBUMIN 1.7*  2.0* 1.8* 1.7* 1.7*   No results for input(s): LIPASE, AMYLASE in the last 168 hours. No results for  input(s): AMMONIA in the last 168 hours.  Coagulation Profile: Recent Labs  Lab 01/15/18 0641 01/16/18 0603 01/17/18 0652 01/18/18 0625 01/19/18 0634  INR 1.01 1.03 0.97 0.90 0.96    CBG: Recent Labs  Lab 01/18/18 1216 01/18/18 1822 01/18/18 2119 01/19/18 0745 01/19/18 1140  GLUCAP 146* 138* 84 104* 99    Lipid Profile: No results for input(s): CHOL, HDL, LDLCALC, TRIG, CHOLHDL, LDLDIRECT in the last 72 hours.  Thyroid Function Tests: No results for input(s): TSH, T4TOTAL, FREET4, T3FREE, THYROIDAB in the last 72 hours.   MICROBIOLOGY:  Recent Results (from the past 240 hour(s))  Culture, blood (routine x 2)     Status: None (Preliminary result)   Collection Time: 01/15/18  9:17 AM  Result Value Ref Range Status   Specimen Description   Final    BLOOD RIGHT ARM Performed at [facility] [address] [place], [place] [address]    Special Requests   Final    BOTTLES DRAWN AEROBIC AND ANAEROBIC Blood Culture adequate volume Performed at [facility] [address] [place], [place] [address]    Culture   Final    NO GROWTH 3 DAYS Performed at [facility], [place] [address], [place], [place] [address]    Report Status PENDING  Incomplete  Culture, blood (routine x 2)     Status: None (Preliminary result)   Collection Time: 01/15/18  9:18 AM  Result Value Ref Range Status   Specimen Description   Final    BLOOD LEFT HAND Performed at [facility] [address] [place], [place] [address]    Special Requests   Final    BOTTLES DRAWN AEROBIC ONLY Blood Culture adequate volume Performed at [facility] [address] [place], [place] [address]    Culture   Final    NO GROWTH 3 DAYS Performed at [facility], [place] [address], [place], [place] [address]    Report  Status PENDING  Incomplete    RADIOLOGY STUDIES/RESULTS:  Dg Chest 2 View  Result Date: 12/31/2017 CLINICAL DATA:  Nipple marker request EXAM: CHEST  2 VIEW COMPARISON:  1100 hours FINDINGS: Left basilar nodular density corresponds to the nipple marker. Allowing for nipple shadows, lungs are clear other than minimal subsegmental atelectasis for scarring at the left base. Lungs are clear. No pneumothorax or pleural effusion. Mild cardiomegaly. Normal vascularity. IMPRESSION: No active cardiopulmonary disease. Electronically Signed   By: Marybelle Killings M.D.   On: 12/31/2017 19:13   Ct Head Wo Contrast  Result Date: 01/05/2018 CLINICAL DATA:  42 year old female with history of altered level of consciousness. Fall this morning. Possible seizure. EXAM: CT HEAD WITHOUT CONTRAST TECHNIQUE: Contiguous axial images were obtained from the base of the skull through the vertex without intravenous contrast. COMPARISON:  Head CT 03/10/2012. FINDINGS: Brain: No evidence of acute infarction, hemorrhage, hydrocephalus, extra-axial collection or mass lesion/mass effect. Vascular: No hyperdense vessel or unexpected calcification. Skull: Normal. Negative for fracture or focal lesion. Sinuses/Orbits: No acute finding. Other: None. IMPRESSION: 1. No acute intracranial abnormalities. The appearance of the brain is normal. Electronically Signed   By: Vinnie Langton M.D.   On: 01/05/2018 11:01   Ct Abdomen Pelvis W Contrast  Result Date: 01/06/2018 CLINICAL DATA:  Abdominal distension with elevated lactate EXAM: CT ABDOMEN AND PELVIS WITH CONTRAST TECHNIQUE: Multidetector CT imaging of the abdomen and pelvis was performed using the standard protocol following bolus administration of  intravenous contrast. CONTRAST:  112m ISOVUE-300 IOPAMIDOL (ISOVUE-300) INJECTION 61% COMPARISON:  MRI 12/31/2017, CT abdomen pelvis 12/30/2017, 10/23/2017, 10/21/2017, 10/13/2017, 10/16/2016 FINDINGS: Lower chest: Lung bases demonstrate moderate  pleural effusions, increased compared to prior. Partial consolidation in the lower lobes, atelectasis versus pneumonia. Mild cardiomegaly. Mild distal esophageal and GE junction thickening. Hepatobiliary: No focal hepatic abnormality. Surgical clips at the gallbladder fossa. No biliary dilatation Pancreas: Decreased pancreatic edema.  No ductal dilatation Spleen: Interval enlargement of the spleen now measuring 14 cm on coronal views. Adrenals/Urinary Tract: Adrenal glands are unremarkable. Kidneys are normal, without renal calculi, focal lesion, or hydronephrosis. Bladder is unremarkable. Stomach/Bowel: Stomach is unremarkable. Fluid-filled loops of small bowel within the abdomen. No significant wall thickening. No intramural air. Normal appendix. No colon wall thickening. Vascular/Lymphatic: Nonaneurysmal aorta. Patent portal vein. Non enhancement of the splenic vein consistent with occlusion, new since the comparison CT. Enlarged retroperitoneal lymph nodes as before. Reproductive: Uterus and bilateral adnexa are unremarkable. Other: Large volume of ascites, increased compared to prior. No free air. Musculoskeletal: Degenerative changes. No acute or suspicious abnormality. IMPRESSION: 1. Moderate bilateral pleural effusions, increased compared to prior. Partial consolidations in the bilateral lower lobes may reflect atelectasis or pneumonia 2. 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 Portal vein remains patent. Interval enlargement of the spleen. 3. Decreased pancreatic edema. Large volume of ascites in the abdomen and pelvis, increased compared to prior. 4. Fluid-filled loops of central small bowel, possible ileus. No intramural air or portal venous gas. 5. Stable retroperitoneal lymphadenopathy Electronically Signed   By: KDonavan FoilM.D.   On: 01/06/2018 23:28   Ct Abdomen Pelvis W Contrast  Result Date: 12/30/2017 CLINICAL DATA:  Generalized  abdominal pain, vomiting and hematuria. HIV positive. Elevated lipase. Prior cholecystectomy. EXAM: CT ABDOMEN AND PELVIS WITH CONTRAST TECHNIQUE: Multidetector CT imaging of the abdomen and pelvis was performed using the standard protocol following bolus administration of intravenous contrast. CONTRAST:  80 cc Isovue-300 IV. COMPARISON:  10/21/2017 CT abdomen/pelvis. FINDINGS: Lower chest: No significant pulmonary nodules or acute consolidative airspace disease. Hepatobiliary: Normal liver size. No liver mass. Cholecystectomy. No biliary ductal dilatation. Common bile duct diameter 5 mm. Pancreas: There is acute diffuse pancreatic enlargement with prominent pancreatic and peripancreatic edema with ill-defined peripancreatic fluid extending into the bilateral anterior paranephric retroperitoneal spaces and into the lesser sac, compatible with acute pancreatitis. No regions of pancreatic parenchymal nonenhancement or gas. No pancreatic mass or duct dilation. No measurable peripancreatic fluid collections. Spleen: Normal size. No mass. Adrenals/Urinary Tract: Normal adrenals. Normal kidneys with no hydronephrosis and no renal mass. Normal bladder. Stomach/Bowel: Normal non-distended stomach. Normal caliber small bowel with no small bowel wall thickening. Appendix. Normal large bowel with no diverticulosis, large bowel wall thickening or pericolonic fat stranding. Vascular/Lymphatic: Normal caliber abdominal aorta. Patent portal, splenic, hepatic and renal veins. Stable mildly enlarged 1.0 cm right retrocrural node (series 2/image 15). Stable mildly enlarged 1.2 cm porta hepatis node (series 2/image 22). Stable mild left para-aortic adenopathy measuring up to 1.4 cm (series 2/image 31). No new pathologically enlarged abdominopelvic nodes. Reproductive: Grossly normal uterus.  No adnexal mass. Other: Small volume ascites. No pneumoperitoneum. No focal fluid collection. Musculoskeletal: No aggressive appearing focal  osseous lesions. IMPRESSION: 1. Acute pancreatitis diffusely involving the pancreas, without complication. 2. Small volume ascites. 3. Chronic porta hepatis and retroperitoneal adenopathy is stable and probably reactive. Electronically Signed   By: JIlona SorrelM.D.   On: 12/30/2017 02:45  Ir Venogram Hepatic W Hemodynamic Evaluation  Result Date: 01/07/2018 INDICATION: 42 year old female with a complex medical history including markedly abnormal liver enzymes. Request for liver biopsy. Patient has thrombocytopenia and received platelets prior to the procedure. EXAM: TRANSJUGULAR LIVER BIOPSY WITH FLUOROSCOPY HEPATIC VENOGRAPHY WITH PRESSURES ULTRASOUND GUIDANCE FOR VASCULAR ACCESS MEDICATIONS: None. ANESTHESIA/SEDATION: Moderate (conscious) sedation was employed during this procedure. A total of Versed 1 mg and Fentanyl 25 mcg was administered intravenously. Moderate Sedation Time: 41 minutes. The patient's level of consciousness and vital signs were monitored continuously by radiology nursing throughout the procedure under my direct supervision. FLUOROSCOPY TIME:  Fluoroscopy Time: 7 minutes and 54 seconds. 884 mGy COMPLICATIONS: None immediate. PROCEDURE: Informed written consent was obtained from the patient after a thorough discussion of the procedural risks, benefits and alternatives. All questions were addressed. Maximal Sterile Barrier Technique was utilized including caps, mask, sterile gowns, sterile gloves, sterile drape, hand hygiene and skin antiseptic. A timeout was performed prior to the initiation of the procedure. Ultrasound confirmed a patent right internal jugular vein. The right side of the neck was prepped and draped in a sterile fashion. Skin was anesthetized with 1% lidocaine. 21 gauge needle directed into the right internal jugular vein with ultrasound guidance. Micropuncture dilator set was placed. Five French catheter was advanced into the IVC and a Bentson wire was placed. The tract  was dilated to accommodate a 10 [place] sheath. 5 French catheter was advanced into a right hepatic vein. Hepatic venography was performed. Wedged and free hepatic vein pressures were obtained. The transjugular biopsy kit was then advanced through the sheath over a stiff Amplatz wire. Total of 5 core biopsies were performed. Four adequate core biopsies were obtained. Three specimens placed in formalin for pathology. One specimen was placed in saline for microbiology. Follow-up venography was performed through the 5 French catheter at of the procedure. Sheath were removed with manual compression. Fluoroscopic and ultrasound images were taken and saved for documentation. FINDINGS: Two sets of hepatic venous pressures were obtained. The mean gradient on the first set of pressures was 7. The gradient on the second set of pressures was 6-7. Findings are suggestive for an elevated hepatic venous pressure gradient and concerning for portal hypertension. Core biopsies obtained from the right hepatic lobe. Four adequate specimens were obtained. No significant bleeding or extravasation on the final hepatic venography. IMPRESSION: Successful transjugular liver biopsy. Specimens sent to pathology and microbiology. Elevated hepatic venous pressure gradient. Electronically Signed   By: Markus Daft M.D.   On: 01/07/2018 18:17   Ir Transcatheter Bx  Result Date: 01/07/2018 INDICATION: 42 year old female with a complex medical history including markedly abnormal liver enzymes. Request for liver biopsy. Patient has thrombocytopenia and received platelets prior to the procedure. EXAM: TRANSJUGULAR LIVER BIOPSY WITH FLUOROSCOPY HEPATIC VENOGRAPHY WITH PRESSURES ULTRASOUND GUIDANCE FOR VASCULAR ACCESS MEDICATIONS: None. ANESTHESIA/SEDATION: Moderate (conscious) sedation was employed during this procedure. A total of Versed 1 mg and Fentanyl 25 mcg was administered intravenously. Moderate Sedation Time: 41 minutes. The patient's level  of consciousness and vital signs were monitored continuously by radiology nursing throughout the procedure under my direct supervision. FLUOROSCOPY TIME:  Fluoroscopy Time: 7 minutes and 54 seconds. 166 mGy COMPLICATIONS: None immediate. PROCEDURE: Informed written consent was obtained from the patient after a thorough discussion of the procedural risks, benefits and alternatives. All questions were addressed. Maximal Sterile Barrier Technique was utilized including caps, mask, sterile gowns, sterile gloves, sterile drape, hand hygiene and skin antiseptic. A timeout was performed  prior to the initiation of the procedure. Ultrasound confirmed a patent right internal jugular vein. The right side of the neck was prepped and draped in a sterile fashion. Skin was anesthetized with 1% lidocaine. 21 gauge needle directed into the right internal jugular vein with ultrasound guidance. Micropuncture dilator set was placed. Five French catheter was advanced into the IVC and a Bentson wire was placed. The tract was dilated to accommodate a 10 [place] sheath. 5 French catheter was advanced into a right hepatic vein. Hepatic venography was performed. Wedged and free hepatic vein pressures were obtained. The transjugular biopsy kit was then advanced through the sheath over a stiff Amplatz wire. Total of 5 core biopsies were performed. Four adequate core biopsies were obtained. Three specimens placed in formalin for pathology. One specimen was placed in saline for microbiology. Follow-up venography was performed through the 5 French catheter at of the procedure. Sheath were removed with manual compression. Fluoroscopic and ultrasound images were taken and saved for documentation. FINDINGS: Two sets of hepatic venous pressures were obtained. The mean gradient on the first set of pressures was 7. The gradient on the second set of pressures was 6-7. Findings are suggestive for an elevated hepatic venous pressure gradient and concerning  for portal hypertension. Core biopsies obtained from the right hepatic lobe. Four adequate specimens were obtained. No significant bleeding or extravasation on the final hepatic venography. IMPRESSION: Successful transjugular liver biopsy. Specimens sent to pathology and microbiology. Elevated hepatic venous pressure gradient. Electronically Signed   By: Markus Daft M.D.   On: 01/07/2018 18:17   Mr 3d Recon At Scanner  Result Date: 12/31/2017 CLINICAL DATA:  Abnormal liver function tests. Elevated lipase. HIV. EXAM: MRI ABDOMEN WITHOUT AND WITH CONTRAST (INCLUDING MRCP) TECHNIQUE: Multiplanar multisequence MR imaging of the abdomen was performed both before and after the administration of intravenous contrast. Heavily T2-weighted images of the biliary and pancreatic ducts were obtained, and three-dimensional MRCP images were rendered by post processing. CONTRAST:  5m MULTIHANCE GADOBENATE DIMEGLUMINE 529 MG/ML IV SOLN COMPARISON:  CT 12/30/2017 FINDINGS: Lower chest:  Lung bases are clear. Hepatobiliary: No intrahepatic duct dilatation. No focal hepatic lesion. No enhancing hepatic lesion. Postcholecystectomy. Common bile duct normal caliber. Pancreas: Mild edema associated with the pancreas. Pancreatic duct is normal caliber. No organized fluid collections. Small amount fluid along the LEFT gutter. Contrast enhanced imaging demonstrates uniform enhancement of the pancreas Spleen: Normal spleen. Adrenals/urinary tract: Adrenal glands and kidneys are normal. Stomach/Bowel: Stomach and limited of the small bowel is unremarkable Vascular/Lymphatic: There are enlarged lymph nodes the retroperitoneum along the aorta the kidneys. For example 12 mm and 8 mm short axis lymph nodes LEFT aorta on image 67, series 12/19/2001 Musculoskeletal: No aggressive osseous lesion IMPRESSION: 1. Mild pancreatic edema associated with pancreatitis. 2. Moderate intraperitoneal free fluid. No organized fluid collections. 3. No biliary  obstruction.  Postcholecystectomy. 4. Mild retroperitoneal lymphadenopathy favor HIV adenopathy. Electronically Signed   By: SSuzy BouchardM.D.   On: 12/31/2017 17:36   Dg Chest Port 1 View  Result Date: 01/02/2018 CLINICAL DATA:  Fever.  Status post EGD. EXAM: PORTABLE CHEST 1 VIEW COMPARISON:  Chest x-ray dated December 31, 2017. FINDINGS: Stable mild cardiomegaly. Mild interstitial edema. Small bilateral pleural effusions with bibasilar atelectasis. No pneumothorax. No acute osseous abnormality. IMPRESSION: New mild interstitial pulmonary edema and small bilateral pleural effusions. Electronically Signed   By: WTitus DubinM.D.   On: 01/02/2018 10:09   [facility]  Result  Date: 12/31/2017 CLINICAL DATA:  Shortness of breath, chest congestion, and productive cough. History of HIV, nonsmoker. EXAM: PORTABLE CHEST 1 VIEW COMPARISON:  Chest x-ray and chest CT scan of October 21, 2017 FINDINGS: The lungs are well-expanded. There is no focal infiltrate. There is a rounded approximately 1.5 cm diameter structure projecting over the posterolateral aspect of the left ninth rib which may reflect a nipple shadow. No similar finding is noted on the right however. Some asymmetry of the nipples was noted on the CT scan of October 21, 2017. The apices appear clear. The heart and pulmonary vascularity are normal. The mediastinum is normal in width. No mediastinal or hilar lymphadenopathy is observed. There is no pleural effusion. The bony thorax is unremarkable. IMPRESSION: No acute infiltrate or definite mass. Presumed prominent nipple shadow on the left. A repeat chest x-ray with nipple markers and a lateral view would be useful to more completely evaluate the thorax in this patient with cryptogenic hemoptysis. These findings were discussed by telephone with Dr. Florene Glen. Electronically Signed   By: David  Martinique M.D.   On: 12/31/2017 13:27   Dg Esophagus Dilation  Result Date: 01/14/2018 ESOPHAGEAL  DILATATION: Fluoroscopy was provided for use by the requesting physician.  No images were obtained for radiographic interpretation.  Mr Abdomen Mrcp Moise Boring Contast  Result Date: 12/31/2017 CLINICAL DATA:  Abnormal liver function tests. Elevated lipase. HIV. EXAM: MRI ABDOMEN WITHOUT AND WITH CONTRAST (INCLUDING MRCP) TECHNIQUE: Multiplanar multisequence MR imaging of the abdomen was performed both before and after the administration of intravenous contrast. Heavily T2-weighted images of the biliary and pancreatic ducts were obtained, and three-dimensional MRCP images were rendered by post processing. CONTRAST:  81m MULTIHANCE GADOBENATE DIMEGLUMINE 529 MG/ML IV SOLN COMPARISON:  CT 12/30/2017 FINDINGS: Lower chest:  Lung bases are clear. Hepatobiliary: No intrahepatic duct dilatation. No focal hepatic lesion. No enhancing hepatic lesion. Postcholecystectomy. Common bile duct normal caliber. Pancreas: Mild edema associated with the pancreas. Pancreatic duct is normal caliber. No organized fluid collections. Small amount fluid along the LEFT gutter. Contrast enhanced imaging demonstrates uniform enhancement of the pancreas Spleen: Normal spleen. Adrenals/urinary tract: Adrenal glands and kidneys are normal. Stomach/Bowel: Stomach and limited of the small bowel is unremarkable Vascular/Lymphatic: There are enlarged lymph nodes the retroperitoneum along the aorta the kidneys. For example 12 mm and 8 mm short axis lymph nodes LEFT aorta on image 67, series 12/19/2001 Musculoskeletal: No aggressive osseous lesion IMPRESSION: 1. Mild pancreatic edema associated with pancreatitis. 2. Moderate intraperitoneal free fluid. No organized fluid collections. 3. No biliary obstruction.  Postcholecystectomy. 4. Mild retroperitoneal lymphadenopathy favor HIV adenopathy. Electronically Signed   By: SSuzy BouchardM.D.   On: 12/31/2017 17:36     LOS: 20 days   Signature  PLala LundM.D on 01/19/2018 at 1:18 PM  Between  7am to 7pm - Pager - [phone]( page via a[email or web address], text pages only, please mention full 10 digit call back number).  After 7pm go to [email or web address] - password [email or web address]

## 2018-01-19 NOTE — Progress Notes (Signed)
Patient ID: [name], female   DOB: [birth year], 42 y.o.   MRN: [id number]          [facility] Infectious [facility]  Date of Admission:  12/29/2017           Day 15 anidulafungin        Day 7 off of antiretroviral therapy ASSESSMENT: Her liver enzymes and bilirubin are declining.  He remains unclear to me whether or not her hepatobiliary issues were caused by [facility].  However, I will keep her off of antiretroviral therapy for now seeing her back in clinic shortly of discharge.  PLAN: 1. Consider discharge soon  Principal Problem:   Biliary dyskinesia Active Problems:   HIV disease ([place])   Bipolar disorder (HCC)   Hypokalemia   Esophageal stricture   GERD (gastroesophageal reflux disease)   Candidal esophagitis (HCC)   Severe protein-calorie malnutrition (HCC)   Pancytopenia (HCC)   Pancreatitis   Lactic acidosis   Sinus bradycardia   Pressure injury of skin   Scheduled Meds: . benztropine  1 mg Oral Daily  . [START ON 01/20/2018] dexamethasone  2 mg Oral Q breakfast   Followed by  . [START ON 01/21/2018] dexamethasone  1 mg Oral Q breakfast   Followed by  . [START ON 01/22/2018] dexamethasone  0.5 mg Oral Q breakfast  . feeding supplement  1 Container Oral TID BM  . folic acid  2 mg Oral Daily  . insulin aspart  0-9 Units Subcutaneous TID WC  . iopamidol  30 mL Oral Once  . multivitamin with minerals  1 tablet Oral Daily  . OLANZapine  5 mg Oral QHS  . pantoprazole (PROTONIX) IV  40 mg Intravenous Q12H   Continuous Infusions: . anidulafungin Stopped (01/18/18 1910)  . IMMUNE GLOBULIN 10% (HUMAN) IV - For Fluid Restriction Only 30 g (01/19/18 1139)  . magnesium sulfate 1 - 4 g bolus IVPB 2 g (01/19/18 1408)  . potassium chloride 10 mEq (01/19/18 1319)  . potassium chloride     PRN Meds:.albuterol, hydrALAZINE, hyoscyamine, magic mouthwash w/lidocaine, [DISCONTINUED] ondansetron **OR** ondansetron (ZOFRAN) IV, oxyCODONE   SUBJECTIVE: [name] says that  her bipolar disorder is bothering her more since she has not been on her medication during this admission.  She states that she is having difficulty talking and feels like she is stuttering.  She was started back on her medication today.  She says that she does want to get back on HIV therapy soon.  She is tolerating food much better now.  Review of Systems: Review of Systems  Constitutional: Negative for chills, diaphoresis and fever.  Respiratory: Negative for cough.   Cardiovascular: Negative for chest pain.  Gastrointestinal: Negative for abdominal pain, nausea and vomiting.  Neurological: Negative for headaches.    Allergies  Allergen Reactions  . Other Hives and Itching    Berries, TREE NUTS  . Peanut-Containing Drug Products Hives  . Shellfish Allergy Hives  . Bactrim Itching  . Orange Fruit Itching  . Sulfa Antibiotics Itching    OBJECTIVE: Vitals:   01/19/18 1158 01/19/18 1218 01/19/18 1235 01/19/18 1401  BP: (!) 145/101 (!) 160/102 126/85 (!) 140/96  Pulse: 84 82 97 100  Resp: 17 17 17 17   Temp: 99.1 F (37.3 C)  98.8 F (37.1 C) 99 F (37.2 C)  TempSrc: Oral  Oral Oral  SpO2: 100%  100% 100%  Weight:      Height:       Body mass  index is 18 kg/m.  Physical Exam  Constitutional: She is oriented to person, place, and time.  She is sitting up in a chair watching a movie on her phone.  She will sometimes stop and have to work on getting her words out.  HENT:  Mouth/Throat: No oropharyngeal exudate.  Cardiovascular: Normal rate and regular rhythm.  No murmur heard. Pulmonary/Chest: Effort normal and breath sounds normal.  Abdominal: Soft. There is no tenderness.  Neurological: She is alert and oriented to person, place, and time.  Psychiatric: Mood and affect normal.    Lab Results Lab Results  Component Value Date   WBC 2.7 (L) 01/19/2018   HGB 9.3 (L) 01/19/2018   HCT 25.7 (L) 01/19/2018   MCV 80.8 01/19/2018   PLT 110 (L) 01/19/2018    Lab  Results  Component Value Date   CREATININE 0.42 (L) 01/19/2018   BUN 10 01/19/2018   NA 131 (L) 01/19/2018   K 3.0 (L) 01/19/2018   CL 98 (L) 01/19/2018   CO2 27 01/19/2018    Lab Results  Component Value Date   ALT 72 (H) 01/19/2018   AST 84 (H) 01/19/2018   ALKPHOS 365 (H) 01/19/2018   BILITOT 9.6 (H) 01/19/2018     Microbiology: Recent Results (from the past 240 hour(s))  Culture, blood (routine x 2)     Status: None (Preliminary result)   Collection Time: 01/15/18  9:17 AM  Result Value Ref Range Status   Specimen Description   Final    BLOOD RIGHT ARM Performed at [facility], [place] [address], [place], [place] [address]    Special Requests   Final    BOTTLES DRAWN AEROBIC AND ANAEROBIC Blood Culture adequate volume Performed at [place] [address], [place], [place] [address]    Culture   Final    NO GROWTH 3 DAYS Performed at [facility], [place] [address], [place], [place] [address]    Report Status PENDING  Incomplete  Culture, blood (routine x 2)     Status: None (Preliminary result)   Collection Time: 01/15/18  9:18 AM  Result Value Ref Range Status   Specimen Description   Final    BLOOD LEFT HAND Performed at [facility] [address], [place], [place] [address]    Special Requests   Final    BOTTLES DRAWN AEROBIC ONLY Blood Culture adequate volume Performed at [facility] [address], [place], [place] [address]    Culture   Final    NO GROWTH 3 DAYS Performed at [facility], [place] [address], [place], [place] [address]    Report Status PENDING  Incomplete    Michel Bickers, MD [facility] for [facility] [phone] pager   [phone] cell 01/19/2018, 2:33 PM

## 2018-01-19 NOTE — Progress Notes (Signed)
PT Cancellation Note  Patient Details Name: MAYLIN FREEBURG MRN: [id number] DOB: 26-Jun-1976   Cancelled Treatment:    Reason Eval/Treat Not Completed: PT screened, no needs identified, will sign off  Previously signed off on pt on 1/26 due to numerous cancels and requested nursing mobilize pt has she preferred.  Spoke with RN who reports pt is up to [facility] independently and has not ambulated however pt appears self limiting in her mobility per RN.  Pt sitting up in recliner today which RN reports pt had no difficulty transferring.  Pt to d/c possibly tomorrow, recommend nursing continue attempts to assist pt with mobility (per her willingness to participate).  PT to sign off.  Esaias Cleavenger,KATHrine E 01/19/2018, 4:26 PM Carmelia Bake, PT, DPT 01/19/2018 Pager: [phone]

## 2018-01-19 NOTE — Plan of Care (Signed)
  Pain Managment: General experience of comfort will improve 01/19/2018 0130 - Progressing by Mickie Kay, RN

## 2018-01-19 NOTE — Progress Notes (Signed)
[id number] Maris Bena,BSN,RN3,CCM: Chart reviewed for patient status and dc needs.  None present at time of review will follow.

## 2018-01-19 NOTE — Progress Notes (Signed)
Nutrition Follow-up  DOCUMENTATION CODES:   Severe malnutrition in context of chronic illness, Underweight  INTERVENTION:   Continue Boost Breeze po TID, each supplement provides 250 kcal and 9 grams of protein  NUTRITION DIAGNOSIS:   Severe Malnutrition related to chronic illness, catabolic illness(HIV/AIDS) as evidenced by severe muscle depletion, severe fat depletion.  Ongoing   GOAL:   Patient will meet greater than or equal to 90% of their needs  Progressing  MONITOR:   PO intake, Supplement acceptance, Weight trends, Labs  ASSESSMENT:   42 y.o.female with medical history significant of HIV AIDS, Candida esophagitis, GERD, bipolar disorder, asthma, who comes in for 2 weeks of progressive worsening epigastric abdominal pain. Patient deteriorating for months, losing a significant amount of weight. She reports she is compliant with her HIV medications. She reports that she has been having dysphagia for months. She was given oral Diflucan however she cannot swallow the pills. She called ID office for liquid format which looks like was ordered by ID but patient never received this. She has been able to tolerate pure her food and able to hold that down. She cannot eat solids without meat getting stuck. She does frequently vomit depending on what she eats. No diarrhea. No cough. Her abdominal pain has been getting worse and is worse after eating. Sometimes she goes days without eating. Patient found to have pancreatitis with a lipase level over 1000  1/18- EGD- severe esophagitis and esophogeal stenosis requiring dilation 1/23- liver biopsy 1/30- another EGD with dilation   Discussed pt with RN. Pt wrote down answers to RD questions at visit.  Pt reports no nutrition impact symptoms except ongoing mouth pain. Due to mouth ulcers pt's diet was changed to Soft. Pt states she is tolerating this well and has been receiving outside foods. [employer] box of donuts at bedside. Pt's meal  completion records show an increase in intake since last RD visit ranging from 50-90% complete.   Weight trending down since last visit, likely secondary to Lasix which has recently been held given pt's hyponatremia.  Continue to monitor weight trends.   Per RN and chart, pt's intake of Boost Breeze supplements has fluctuated.  Pt requests continued supplementation and that she is unwilling to try another type of nutritional supplement.   Labs reviewed; CBG 84-198, Na 131, K 3.0, Albumin 1.7, Hemoglobin 9.3 Medications reviewed; Decadron, folic acid, sliding scale insulin, multivitamin, Protonix, IV potassium chloride  Diet Order:  Fall precautions DIET SOFT Room service appropriate? Yes; Fluid consistency: Thin  EDUCATION NEEDS:   No education needs have been identified at this time  Skin:  Skin Assessment: Skin Integrity Issues: Skin Integrity Issues:: Stage II Stage II: sacrum  Last BM:  01/18/18  Height:   Ht Readings from Last 1 Encounters:  12/29/17 6\' 1"  (1.854 m)    Weight:   Wt Readings from Last 1 Encounters:  01/19/18 136 lb 7.4 oz (61.9 kg)    Ideal Body Weight:  75 kg  BMI:  Body mass index is 18 kg/m.  Estimated Nutritional Needs:   Kcal:  1865-2100 (40-45 kcal/kg)  Protein:  70-80 grams (1.5-1.7 grams/kg)  Fluid:  >/= 2 L/day  [place] Ranger, MS, RDN, LDN 01/19/2018 12:26 PM

## 2018-01-20 DIAGNOSIS — R509 Fever, unspecified: Secondary | ICD-10-CM

## 2018-01-20 DIAGNOSIS — K130 Diseases of lips: Secondary | ICD-10-CM

## 2018-01-20 LAB — CBC WITH DIFFERENTIAL/PLATELET
BASOS ABS: 0 10*3/uL (ref 0.0–0.1)
BASOS PCT: 0 %
EOS ABS: 0 10*3/uL (ref 0.0–0.7)
Eosinophils Relative: 0 %
HCT: 25.6 % — ABNORMAL LOW (ref 36.0–46.0)
Hemoglobin: 8.8 g/dL — ABNORMAL LOW (ref 12.0–15.0)
LYMPHS PCT: 4 %
Lymphs Abs: 0.1 10*3/uL — ABNORMAL LOW (ref 0.7–4.0)
MCH: 28.7 pg (ref 26.0–34.0)
MCHC: 34.4 g/dL (ref 30.0–36.0)
MCV: 83.4 fL (ref 78.0–100.0)
Monocytes Absolute: 0.1 10*3/uL (ref 0.1–1.0)
Monocytes Relative: 6 %
Neutro Abs: 2.1 10*3/uL (ref 1.7–7.7)
Neutrophils Relative %: 90 %
PLATELETS: 108 10*3/uL — AB (ref 150–400)
RBC: 3.07 MIL/uL — ABNORMAL LOW (ref 3.87–5.11)
RDW: 24.3 % — ABNORMAL HIGH (ref 11.5–15.5)
WBC: 2.3 10*3/uL — ABNORMAL LOW (ref 4.0–10.5)

## 2018-01-20 LAB — PROTIME-INR
INR: 0.95
PROTHROMBIN TIME: 12.6 s (ref 11.4–15.2)

## 2018-01-20 LAB — CULTURE, BLOOD (ROUTINE X 2)
CULTURE: NO GROWTH
Culture: NO GROWTH
SPECIAL REQUESTS: ADEQUATE
SPECIAL REQUESTS: ADEQUATE

## 2018-01-20 LAB — COMPREHENSIVE METABOLIC PANEL
ALT: 73 U/L — AB (ref 14–54)
AST: 82 U/L — AB (ref 15–41)
Albumin: 1.6 g/dL — ABNORMAL LOW (ref 3.5–5.0)
Alkaline Phosphatase: 330 U/L — ABNORMAL HIGH (ref 38–126)
Anion gap: 6 (ref 5–15)
BUN: 9 mg/dL (ref 6–20)
CHLORIDE: 99 mmol/L — AB (ref 101–111)
CO2: 23 mmol/L (ref 22–32)
CREATININE: 0.35 mg/dL — AB (ref 0.44–1.00)
Calcium: 8.2 mg/dL — ABNORMAL LOW (ref 8.9–10.3)
GFR calc Af Amer: >60 mL/min (ref >60)
GFR calc non Af Amer: >60 mL/min (ref >60)
Glucose, Bld: 91 mg/dL (ref 65–99)
Potassium: 3.7 mmol/L (ref 3.5–5.1)
SODIUM: 128 mmol/L — AB (ref 135–145)
Total Bilirubin: 9.8 mg/dL — ABNORMAL HIGH (ref 0.3–1.2)
Total Protein: 6 g/dL — ABNORMAL LOW (ref 6.5–8.1)

## 2018-01-20 LAB — GLUCOSE, CAPILLARY
GLUCOSE-CAPILLARY: 91 mg/dL (ref 65–99)
Glucose-Capillary: 82 mg/dL (ref 65–99)

## 2018-01-20 LAB — MAGNESIUM: MAGNESIUM: 1.6 mg/dL — AB (ref 1.7–2.4)

## 2018-01-20 MED ORDER — MAGIC MOUTHWASH W/LIDOCAINE: 10.0000 mL | Freq: Four times a day (QID) | ORAL | 0 refills | Status: AC | PRN

## 2018-01-20 MED ORDER — VALACYCLOVIR HCL 1 G PO TABS: 1000.0000 mg | ORAL_TABLET | Freq: Two times a day (BID) | ORAL | 0 refills | Status: AC

## 2018-01-20 MED ORDER — FUROSEMIDE 10 MG/ML IJ SOLN
20.0000 mg | Freq: Once | INTRAMUSCULAR | Status: AC
  Administered 2018-01-20: 20 mg via INTRAVENOUS
  Filled 2018-01-20: qty 2

## 2018-01-20 MED ORDER — AMLODIPINE BESYLATE 10 MG PO TABS: 10.0000 mg | ORAL_TABLET | Freq: Every day | ORAL | 0 refills | Status: AC

## 2018-01-20 MED ORDER — ACETAMINOPHEN 160 MG/5ML PO SOLN
650.0000 mg | Freq: Once | ORAL | Status: AC
  Administered 2018-01-20: 650 mg via ORAL
  Filled 2018-01-20: qty 20.3

## 2018-01-20 MED ORDER — AMLODIPINE BESYLATE 10 MG PO TABS
10.0000 mg | ORAL_TABLET | Freq: Every day | ORAL | Status: DC
  Filled 2018-01-20: qty 1

## 2018-01-20 MED ORDER — VALACYCLOVIR HCL 500 MG PO TABS
1000.0000 mg | ORAL_TABLET | Freq: Two times a day (BID) | ORAL | Status: DC
  Filled 2018-01-20: qty 2

## 2018-01-20 MED ORDER — POTASSIUM CHLORIDE 10 MEQ/100ML IV SOLN
10.0000 meq | INTRAVENOUS | Status: AC
  Administered 2018-01-20 (×2): 10 meq via INTRAVENOUS
  Filled 2018-01-20 (×2): qty 100

## 2018-01-20 NOTE — Progress Notes (Signed)
Patient ID: [name], female   DOB: October 27, 1976, 42 y.o.   MRN: [id number]          [facility] for Infectious Disease  Date of Admission:  12/29/2017           Day 16 anidulafungin        Day 8 off of antiretroviral therapy ASSESSMENT: Her liver enzymes and bilirubin are improving slowly.  Her pancreatitis has resolved.  I agree with valacyclovir for possible oral herpes.  She is agreeable with following up with me on 01/22/2018.  My plan will be to rechallenge her with antiretroviral therapy soon.  PLAN: 1. Agree with discharge today 2. Follow-up with me on 01/22/2018 at 11:30 AM  Principal Problem:   Biliary dyskinesia Active Problems:   HIV disease ([place])   Bipolar disorder (HCC)   Hypokalemia   Esophageal stricture   GERD (gastroesophageal reflux disease)   Candidal esophagitis (HCC)   Severe protein-calorie malnutrition (HCC)   Pancytopenia (HCC)   Pancreatitis   Lactic acidosis   Sinus bradycardia   Pressure injury of skin   Scheduled Meds: . amLODipine  10 mg Oral Daily  . benztropine  1 mg Oral Daily  . [START ON 01/21/2018] dexamethasone  1 mg Oral Q breakfast   Followed by  . [START ON 01/22/2018] dexamethasone  0.5 mg Oral Q breakfast  . feeding supplement  1 Container Oral TID BM  . folic acid  2 mg Oral Daily  . insulin aspart  0-9 Units Subcutaneous TID WC  . iopamidol  30 mL Oral Once  . multivitamin with minerals  1 tablet Oral Daily  . OLANZapine  5 mg Oral QHS  . pantoprazole (PROTONIX) IV  40 mg Intravenous Q12H  . valACYclovir  1,000 mg Oral BID   Continuous Infusions: . anidulafungin Stopped (01/19/18 1851)  . IMMUNE GLOBULIN 10% (HUMAN) IV - For Fluid Restriction Only Stopped (01/19/18 1830)   PRN Meds:.albuterol, hydrALAZINE, hyoscyamine, magic mouthwash w/lidocaine, [DISCONTINUED] ondansetron **OR** ondansetron (ZOFRAN) IV, oxyCODONE   SUBJECTIVE: She had some low-grade fever this morning some oral ulcers but overall is feeling  dramatically better.  She is eager to go home.  Review of Systems: Review of Systems  Constitutional: Negative for chills, diaphoresis and fever.  HENT:       As noted in HPI.  Respiratory: Negative for cough.   Cardiovascular: Negative for chest pain.  Gastrointestinal: Negative for abdominal pain, nausea and vomiting.  Neurological: Negative for headaches.    Allergies  Allergen Reactions  . Other Hives and Itching    Berries, TREE NUTS  . Peanut-Containing Drug Products Hives  . Shellfish Allergy Hives  . Bactrim Itching  . Orange Fruit Itching  . Sulfa Antibiotics Itching    OBJECTIVE: Vitals:   01/19/18 2111 01/20/18 0618 01/20/18 1216 01/20/18 1409  BP: (!) 148/99 (!) 154/99 (!) 136/101 (!) 143/92  Pulse: 82 79 (!) 112 92  Resp: 16 15 16 18   Temp: 98.2 F (36.8 C) 99.2 F (37.3 C) (!) 100.9 F (38.3 C) 99.4 F (37.4 C)  TempSrc: Oral Oral Oral Oral  SpO2: 96% 100% 100% 100%  Weight:  122 lb 2.2 oz (55.4 kg)    Height:       Body mass index is 16.11 kg/m.  Physical Exam  Constitutional: She is oriented to person, place, and time.  She is smiling and in good spirits.  Her speech is back to normal.  HENT:  Mouth/Throat: No oropharyngeal  exudate.  She does have some shallow oral and lip ulcers.  Cardiovascular: Normal rate and regular rhythm.  No murmur heard. Pulmonary/Chest: Effort normal and breath sounds normal.  Abdominal: Soft. There is no tenderness.  Neurological: She is alert and oriented to person, place, and time.  Skin: No rash noted.  Psychiatric: Mood and affect normal.    Lab Results Lab Results  Component Value Date   WBC 2.3 (L) 01/20/2018   HGB 8.8 (L) 01/20/2018   HCT 25.6 (L) 01/20/2018   MCV 83.4 01/20/2018   PLT 108 (L) 01/20/2018    Lab Results  Component Value Date   CREATININE 0.35 (L) 01/20/2018   BUN 9 01/20/2018   NA 128 (L) 01/20/2018   K 3.7 01/20/2018   CL 99 (L) 01/20/2018   CO2 23 01/20/2018    Lab Results    Component Value Date   ALT 73 (H) 01/20/2018   AST 82 (H) 01/20/2018   ALKPHOS 330 (H) 01/20/2018   BILITOT 9.8 (H) 01/20/2018     Microbiology: Recent Results (from the past 240 hour(s))  Culture, blood (routine x 2)     Status: None (Preliminary result)   Collection Time: 01/15/18  9:17 AM  Result Value Ref Range Status   Specimen Description   Final    BLOOD RIGHT ARM Performed at [facility], [place] [address] [place], [place] [address]    Special Requests   Final    BOTTLES DRAWN AEROBIC AND ANAEROBIC Blood Culture adequate volume Performed at [facility] [address], [place], [place] [address]    Culture   Final    NO GROWTH 4 DAYS Performed at [facility], [place] [address] [place], [place] [address]    Report Status PENDING  Incomplete  Culture, blood (routine x 2)     Status: None (Preliminary result)   Collection Time: 01/15/18  9:18 AM  Result Value Ref Range Status   Specimen Description   Final    BLOOD LEFT HAND Performed at [facility] [address] [place], [place] [address]    Special Requests   Final    BOTTLES DRAWN AEROBIC ONLY Blood Culture adequate volume Performed at [facility] [address], [place], [place] [address]    Culture   Final    NO GROWTH 4 DAYS Performed at [facility], [place] [address], [place],  [address]    Report Status PENDING  Incomplete    Michel Bickers, MD [facility] for [facility] [phone] pager   [phone] cell 01/20/2018, 3:02 PM

## 2018-01-20 NOTE — Progress Notes (Signed)
Two IVs removed. Patient tolerated well. Paper prescriptions provided and DC instructions reviewed with patient. Patient expressed understanding. NT escorted patient to ride at main entrance via wheelchair.

## 2018-01-20 NOTE — Progress Notes (Signed)
ID Dr Eveline Keto updated over phone. He is okay with her being discharged today.

## 2018-01-20 NOTE — Discharge Summary (Signed)
[name] NKN:[id number] DOB: [birth year] DOA: 12/29/2017  PCP: Nolene Ebbs, MD  Admit date: 12/29/2017  Discharge date: 01/20/2018  Admitted From: Home   Disposition:  Home   Recommendations for Outpatient Follow-up:   Follow up with PCP in 1-2 weeks  PCP Please obtain BMP/CBC, 2 view CXR in 1week,  (see Discharge instructions)   PCP Please follow up on the following pending results:    Home Health: None   Equipment/Devices: None  Consultations: GI, ID, [place] Discharge Condition: Fair   CODE STATUS: Full   Diet Recommendation: Soft diet for 5-7 days then advance to regular consistency heart healthy diet as tolerated   Chief Complaint  Patient presents with  . Abdominal Pain     Brief history of present illness from the day of admission and additional interim summary    Patient is a 42 y.o. female with history of HIV who presented to the hospital with worsening abdominal pain-felt to be secondary to acute pancreatitis.  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  Hospital course was complicated by development of ITP, dysphagia due to esophageal stricture requiring EGD and dilatation, and massive volume overload.  She slowly continues to improve-see below for further details                                                                  Hospital Course    Acute pancreatitis: Unclear etiology-patient is status post cholecystectomy- no history of alcohol use.  MRCP of the abdomen negative for biliary  obstruction/pathology.  Pancreatitis seems to have resolved with supportive care.  Tolerating advancement in diet, GI signed off on 01/18/2018 recommended outpatient  follow-up with Dr. Michail Sermon in 2 weeks post discharge.  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  LFTs are improving, INR stable, symptoms have improved, GI has signed off on 01/18/2018 as above, discussed with ID physician Dr. Megan Salon who follow the patient in terms of her HIV and HIV medications in the outpatient setting, patient again counseled on compliance she has long-standing history of noncompliance with medications in the outpatient setting as well as while she was in the hospital.  Continue outpatient GI follow-up as well.  Fever: Occurred 1/31-could be related to worsening hepatitis. Blood cultures on 1/31- so far.  Stable and now afebrile off of antibiotics.    Esophageal stricture: Underwent EGD and dilatation on 1/30-tolerating advancement in diet.  Discharge outpatient GI follow-up with Dr. Michail Sermon.  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  Hematology started IVIG on 01/04/2018 with improvement in platelet counts, last dose of IVIG today thereafter will be discharged with outpatient PCP and hematology follow-up with Dr. Marin Olp.    Hyponatremia: Initially responded well to Lasix, thereafter sodium level started dropping with diuresis, will hold off further diuresis, Lasix discontinued 01/18/2018, sodium now improving will continue to monitor.   Hypokalemia & hypomagnesemia: Repalced will monitor.  She had been noncompliant with oral supplementation of potassium.  Massive volume overload: Secondary to acute illness/hepatitis and severe hypoalbuminemia,  UA was negative for proteinuria.  Her weight had gone up to 250 pounds now down to 232 pounds with Lasix, due to hyponatremia worsening will hold Lasix and  monitor off of it, trace to no edema on exam now.    Esophageal candidiasis: Appears to be stable-continue anidulafungin-we will await further recommendations from ID  HIV: poorly controlled-last CD4 count of 8-antiretrovirals on hold due to ongoing hepatitis.  ID plans on restarting an alternative antiretroviral agent over the next few days.  Defer management to ID.  Steroid-induced hyperglycemia: Suspect will continue to improve as steroids are being tapered down-had hypoglycemic episode this morning.  Sinus bradycardia: Stable and asymptomatic no acute issues.  Bipolar - meds resumed upon pt request.  T not suicidal homicidal, outpatient follow-up with PCP and primary psychiatrist.  Oral herpetic/apathous ulcers.  Could be due to IVIG side effect also herpes cannot be ruled out due to her poorly controlled HIV, she is symptomatically better with Magic mouthwash which will be continued on a as needed basis along with trial of valacyclovir.  Discussed with Dr. Megan Salon.  Essential hypertension.  Placed on Norvasc.  Mild SIADH.  Gentle Lasix x1.  PCP to repeat BMP in a week.    Discharge diagnosis     Principal Problem:   Biliary dyskinesia Active Problems:   HIV disease ([place])   Bipolar disorder ([facility])   Hypokalemia   Esophageal stricture   GERD (gastroesophageal reflux disease)   Candidal esophagitis ([facility])   Severe protein-calorie malnutrition ([facility])   Pancytopenia ([facility])   Pancreatitis   Lactic acidosis   Sinus bradycardia   Pressure injury of skin    Discharge instructions    Discharge Instructions    Discharge instructions   Complete by:  As directed    Follow with Primary MD Nolene Ebbs, MD in 7 days   Get CBC, CMP, 2 view Chest X ray checked  by Primary MD in 5-7 days    Activity: As tolerated with Full fall precautions use walker/cane & assistance as needed  Disposition Home    Diet: Soft diet for 5-7 days then advance as tolerated to regular  consistency heart healthy.  For Heart failure patients - Check your Weight same time everyday, if you gain over 2 pounds, or you develop in leg swelling, experience more shortness of breath or chest pain, call your Primary MD immediately. Follow Cardiac Low Salt Diet and 1.5 lit/day fluid restriction.  Special Instructions: If you have smoked or chewed Tobacco  in the last 2 yrs please stop smoking, stop any regular Alcohol  and or any Recreational drug use.  On your next visit with your primary care physician please Get Medicines reviewed and adjusted.  Please request your Prim.MD to go over all Hospital Tests and Procedure/Radiological results at the follow up, please get all Hospital records sent to your Prim MD by signing hospital release before you go home.  If you experience worsening of your admission  symptoms, develop shortness of breath, life threatening emergency, suicidal or homicidal thoughts you must seek medical attention immediately by calling 911 or calling your MD immediately  if symptoms less severe.  You Must read complete instructions/literature along with all the possible adverse reactions/side effects for all the Medicines you take and that have been prescribed to you. Take any new Medicines after you have completely understood and accpet all the possible adverse reactions/side effects.   Do not drive, operate heavy machinery, perform activities at heights, swimming or participation in water activities or provide baby sitting services if your were admitted for syncope or siezures until you have seen by Primary MD or a Neurologist and advised to do so again.  Do not drive when taking Pain medications.    Do not take more than prescribed Pain, Sleep and Anxiety Medications  Wear Seat belts while driving.   Please note  You were cared for by a hospitalist during your hospital stay. If you have any questions about your discharge medications or the care you received while  you were in the hospital after you are discharged, you can call the unit and asked to speak with the hospitalist on call if the hospitalist that took care of you is not available. Once you are discharged, your primary care physician will handle any further medical issues. Please note that NO REFILLS for any discharge medications will be authorized once you are discharged, as it is imperative that you return to your primary care physician (or establish a relationship with a primary care physician if you do not have one) for your aftercare needs so that they can reassess your need for medications and monitor your lab values.   Increase activity slowly   Complete by:  As directed       Discharge Medications   Allergies as of 01/20/2018      Reactions   Other Hives, Itching   Berries, TREE NUTS   Peanut-containing Drug Products Hives   Shellfish Allergy Hives   Bactrim Itching   Orange Fruit Itching   Sulfa Antibiotics Itching      Medication List    TAKE these medications   albuterol 108 (90 Base) MCG/ACT inhaler Commonly known as:  PROAIR HFA Inhale 2 puffs into the lungs every 4 (four) hours as needed for wheezing or shortness of breath (cough).   amLODipine 10 MG tablet Commonly known as:  NORVASC Take 1 tablet (10 mg total) by mouth daily.   benztropine 1 MG tablet Commonly known as:  COGENTIN Take 1 tablet (1 mg total) by mouth daily.   bictegravir-emtricitabine-tenofovir AF 50-200-25 MG Tabs tablet Commonly known as:  BIKTARVY Take 1 tablet daily by mouth.   dapsone 100 MG tablet Take 100 mg by mouth daily.   fluconazole 200 MG tablet Commonly known as:  DIFLUCAN Take 1 tablet (200 mg total) daily by mouth.   magic mouthwash w/lidocaine Soln Take 10 mLs by mouth 4 (four) times daily as needed for mouth pain.   OLANZapine 5 MG tablet Commonly known as:  ZYPREXA Take 1 tablet (5 mg total) by mouth at bedtime.   ondansetron 8 MG disintegrating tablet Commonly known  as:  ZOFRAN ODT Take 1 tablet (8 mg total) by mouth every 8 (eight) hours as needed for nausea or vomiting.   pantoprazole 40 MG tablet Commonly known as:  PROTONIX Take 1 tablet (40 mg total) by mouth daily.   traMADol 50 MG tablet Commonly known as:  ULTRAM Take  1 tablet (50 mg total) by mouth every 6 (six) hours as needed for moderate pain.   valACYclovir 1000 MG tablet Commonly known as:  VALTREX Take 1 tablet (1,000 mg total) by mouth 2 (two) times daily.       Follow-up Information    Nolene Ebbs, MD. Schedule an appointment as soon as possible for a visit in 1 week(s).   Specialty:  Internal Medicine Contact information: [address] [place] [address] [phone]        Michel Bickers, MD .   Specialty:  Infectious Diseases Contact information: [place]. [place] [address] [phone]        Volanda Napoleon, MD. Schedule an appointment as soon as possible for a visit in 1 week(s).   Specialty:  Oncology Why:  Low Platelets Contact information: [address] [place] [address] [phone]        Wilford Corner, MD. Schedule an appointment as soon as possible for a visit in 1 week(s).   Specialty:  Gastroenterology Contact information: [address] [place] [address] [phone]           Major procedures and Radiology Reports - PLEASE review detailed and final reports thoroughly  -      1/30>> EGD 1/23>> liver biopsy      Dg Chest 2 View  Result Date: 12/31/2017 CLINICAL DATA:  Nipple marker request EXAM: CHEST  2 VIEW COMPARISON:  1100 hours FINDINGS: Left basilar nodular density corresponds to the nipple marker. Allowing for nipple shadows, lungs are clear other than minimal subsegmental atelectasis for scarring at the left base. Lungs are clear. No pneumothorax or pleural effusion. Mild cardiomegaly. Normal vascularity. IMPRESSION: No active  cardiopulmonary disease. Electronically Signed   By: Marybelle Killings M.D.   On: 12/31/2017 19:13   Ct Head Wo Contrast  Result Date: 01/05/2018 CLINICAL DATA:  42 year old female with history of altered level of consciousness. Fall this morning. Possible seizure. EXAM: CT HEAD WITHOUT CONTRAST TECHNIQUE: Contiguous axial images were obtained from the base of the skull through the vertex without intravenous contrast. COMPARISON:  Head CT 03/10/2012. FINDINGS: Brain: No evidence of acute infarction, hemorrhage, hydrocephalus, extra-axial collection or mass lesion/mass effect. Vascular: No hyperdense vessel or unexpected calcification. Skull: Normal. Negative for fracture or focal lesion. Sinuses/Orbits: No acute finding. Other: None. IMPRESSION: 1. No acute intracranial abnormalities. The appearance of the brain is normal. Electronically Signed   By: Vinnie Langton M.D.   On: 01/05/2018 11:01   Ct Abdomen Pelvis W Contrast  Result Date: 01/06/2018 CLINICAL DATA:  Abdominal distension with elevated lactate EXAM: CT ABDOMEN AND PELVIS WITH CONTRAST TECHNIQUE: Multidetector CT imaging of the abdomen and pelvis was performed using the standard protocol following bolus administration of intravenous contrast. CONTRAST:  135m ISOVUE-300 IOPAMIDOL (ISOVUE-300) INJECTION 61% COMPARISON:  MRI 12/31/2017, CT abdomen pelvis 12/30/2017, 10/23/2017, 10/21/2017, 10/13/2017, 10/16/2016 FINDINGS: Lower chest: Lung bases demonstrate moderate pleural effusions, increased compared to prior. Partial consolidation in the lower lobes, atelectasis versus pneumonia. Mild cardiomegaly. Mild distal esophageal and GE junction thickening. Hepatobiliary: No focal hepatic abnormality. Surgical clips at the gallbladder fossa. No biliary dilatation Pancreas: Decreased pancreatic edema.  No ductal dilatation Spleen: Interval enlargement of the spleen now measuring 14 cm on coronal views. Adrenals/Urinary Tract: Adrenal glands are unremarkable.  Kidneys are normal, without renal calculi, focal lesion, or hydronephrosis. Bladder is unremarkable. Stomach/Bowel: Stomach is unremarkable. Fluid-filled loops of small bowel within  the abdomen. No significant wall thickening. No intramural air. Normal appendix. No colon wall thickening. Vascular/Lymphatic: Nonaneurysmal aorta. Patent portal vein. Non enhancement of the splenic vein consistent with occlusion, new since the comparison CT. Enlarged retroperitoneal lymph nodes as before. Reproductive: Uterus and bilateral adnexa are unremarkable. Other: Large volume of ascites, increased compared to prior. No free air. Musculoskeletal: Degenerative changes. No acute or suspicious abnormality. IMPRESSION: 1. Moderate bilateral pleural effusions, increased compared to prior. Partial consolidations in the bilateral lower lobes may reflect atelectasis or pneumonia 2. 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 Portal vein remains patent. Interval enlargement of the spleen. 3. Decreased pancreatic edema. Large volume of ascites in the abdomen and pelvis, increased compared to prior. 4. Fluid-filled loops of central small bowel, possible ileus. No intramural air or portal venous gas. 5. Stable retroperitoneal lymphadenopathy Electronically Signed   By: Donavan Foil M.D.   On: 01/06/2018 23:28   Ct Abdomen Pelvis W Contrast  Result Date: 12/30/2017 CLINICAL DATA:  Generalized abdominal pain, vomiting and hematuria. HIV positive. Elevated lipase. Prior cholecystectomy. EXAM: CT ABDOMEN AND PELVIS WITH CONTRAST TECHNIQUE: Multidetector CT imaging of the abdomen and pelvis was performed using the standard protocol following bolus administration of intravenous contrast. CONTRAST:  80 cc Isovue-300 IV. COMPARISON:  10/21/2017 CT abdomen/pelvis. FINDINGS: Lower chest: No significant pulmonary nodules or acute consolidative airspace disease. Hepatobiliary: Normal liver size.  No liver mass. Cholecystectomy. No biliary ductal dilatation. Common bile duct diameter 5 mm. Pancreas: There is acute diffuse pancreatic enlargement with prominent pancreatic and peripancreatic edema with ill-defined peripancreatic fluid extending into the bilateral anterior paranephric retroperitoneal spaces and into the lesser sac, compatible with acute pancreatitis. No regions of pancreatic parenchymal nonenhancement or gas. No pancreatic mass or duct dilation. No measurable peripancreatic fluid collections. Spleen: Normal size. No mass. Adrenals/Urinary Tract: Normal adrenals. Normal kidneys with no hydronephrosis and no renal mass. Normal bladder. Stomach/Bowel: Normal non-distended stomach. Normal caliber small bowel with no small bowel wall thickening. Appendix. Normal large bowel with no diverticulosis, large bowel wall thickening or pericolonic fat stranding. Vascular/Lymphatic: Normal caliber abdominal aorta. Patent portal, splenic, hepatic and renal veins. Stable mildly enlarged 1.0 cm right retrocrural node (series 2/image 15). Stable mildly enlarged 1.2 cm porta hepatis node (series 2/image 22). Stable mild left para-aortic adenopathy measuring up to 1.4 cm (series 2/image 31). No new pathologically enlarged abdominopelvic nodes. Reproductive: Grossly normal uterus.  No adnexal mass. Other: Small volume ascites. No pneumoperitoneum. No focal fluid collection. Musculoskeletal: No aggressive appearing focal osseous lesions. IMPRESSION: 1. Acute pancreatitis diffusely involving the pancreas, without complication. 2. Small volume ascites. 3. Chronic porta hepatis and retroperitoneal adenopathy is stable and probably reactive. Electronically Signed   By: Ilona Sorrel M.D.   On: 12/30/2017 02:45   Ir Venogram Hepatic W Hemodynamic Evaluation  Result Date: 01/07/2018 INDICATION: 42 year old female with a complex medical history including markedly abnormal liver enzymes. Request for liver biopsy. Patient  has thrombocytopenia and received platelets prior to the procedure. EXAM: TRANSJUGULAR LIVER BIOPSY WITH FLUOROSCOPY HEPATIC VENOGRAPHY WITH PRESSURES ULTRASOUND GUIDANCE FOR VASCULAR ACCESS MEDICATIONS: None. ANESTHESIA/SEDATION: Moderate (conscious) sedation was employed during this procedure. A total of Versed 1 mg and Fentanyl 25 mcg was administered intravenously. Moderate Sedation Time: 41 minutes. The patient's level of consciousness and vital signs were monitored continuously by radiology nursing throughout the procedure under my direct supervision. FLUOROSCOPY TIME:  Fluoroscopy Time: 7 minutes and 54 seconds. 244 mGy COMPLICATIONS: None  immediate. PROCEDURE: Informed written consent was obtained from the patient after a thorough discussion of the procedural risks, benefits and alternatives. All questions were addressed. Maximal Sterile Barrier Technique was utilized including caps, mask, sterile gowns, sterile gloves, sterile drape, hand hygiene and skin antiseptic. A timeout was performed prior to the initiation of the procedure. Ultrasound confirmed a patent right internal jugular vein. The right side of the neck was prepped and draped in a sterile fashion. Skin was anesthetized with 1% lidocaine. 21 gauge needle directed into the right internal jugular vein with ultrasound guidance. Micropuncture dilator set was placed. Five French catheter was advanced into the IVC and a Bentson wire was placed. The tract was dilated to accommodate a 10 [place] sheath. 5 French catheter was advanced into a right hepatic vein. Hepatic venography was performed. Wedged and free hepatic vein pressures were obtained. The transjugular biopsy kit was then advanced through the sheath over a stiff Amplatz wire. Total of 5 core biopsies were performed. Four adequate core biopsies were obtained. Three specimens placed in formalin for pathology. One specimen was placed in saline for microbiology. Follow-up venography was performed  through the 5 French catheter at of the procedure. Sheath were removed with manual compression. Fluoroscopic and ultrasound images were taken and saved for documentation. FINDINGS: Two sets of hepatic venous pressures were obtained. The mean gradient on the first set of pressures was 7. The gradient on the second set of pressures was 6-7. Findings are suggestive for an elevated hepatic venous pressure gradient and concerning for portal hypertension. Core biopsies obtained from the right hepatic lobe. Four adequate specimens were obtained. No significant bleeding or extravasation on the final hepatic venography. IMPRESSION: Successful transjugular liver biopsy. Specimens sent to pathology and microbiology. Elevated hepatic venous pressure gradient. Electronically Signed   By: Markus Daft M.D.   On: 01/07/2018 18:17   Ir Transcatheter Bx  Result Date: 01/07/2018 INDICATION: 42 year old female with a complex medical history including markedly abnormal liver enzymes. Request for liver biopsy. Patient has thrombocytopenia and received platelets prior to the procedure. EXAM: TRANSJUGULAR LIVER BIOPSY WITH FLUOROSCOPY HEPATIC VENOGRAPHY WITH PRESSURES ULTRASOUND GUIDANCE FOR VASCULAR ACCESS MEDICATIONS: None. ANESTHESIA/SEDATION: Moderate (conscious) sedation was employed during this procedure. A total of Versed 1 mg and Fentanyl 25 mcg was administered intravenously. Moderate Sedation Time: 41 minutes. The patient's level of consciousness and vital signs were monitored continuously by radiology nursing throughout the procedure under my direct supervision. FLUOROSCOPY TIME:  Fluoroscopy Time: 7 minutes and 54 seconds. 833 mGy COMPLICATIONS: None immediate. PROCEDURE: Informed written consent was obtained from the patient after a thorough discussion of the procedural risks, benefits and alternatives. All questions were addressed. Maximal Sterile Barrier Technique was utilized including caps, mask, sterile gowns, sterile  gloves, sterile drape, hand hygiene and skin antiseptic. A timeout was performed prior to the initiation of the procedure. Ultrasound confirmed a patent right internal jugular vein. The right side of the neck was prepped and draped in a sterile fashion. Skin was anesthetized with 1% lidocaine. 21 gauge needle directed into the right internal jugular vein with ultrasound guidance. Micropuncture dilator set was placed. Five French catheter was advanced into the IVC and a Bentson wire was placed. The tract was dilated to accommodate a 10 [place] sheath. 5 French catheter was advanced into a right hepatic vein. Hepatic venography was performed. Wedged and free hepatic vein pressures were obtained. The transjugular biopsy kit was then advanced through the sheath over a stiff Amplatz wire. Total of  5 core biopsies were performed. Four adequate core biopsies were obtained. Three specimens placed in formalin for pathology. One specimen was placed in saline for microbiology. Follow-up venography was performed through the 5 French catheter at of the procedure. Sheath were removed with manual compression. Fluoroscopic and ultrasound images were taken and saved for documentation. FINDINGS: Two sets of hepatic venous pressures were obtained. The mean gradient on the first set of pressures was 7. The gradient on the second set of pressures was 6-7. Findings are suggestive for an elevated hepatic venous pressure gradient and concerning for portal hypertension. Core biopsies obtained from the right hepatic lobe. Four adequate specimens were obtained. No significant bleeding or extravasation on the final hepatic venography. IMPRESSION: Successful transjugular liver biopsy. Specimens sent to pathology and microbiology. Elevated hepatic venous pressure gradient. Electronically Signed   By: Markus Daft M.D.   On: 01/07/2018 18:17   Mr 3d Recon At Scanner  Result Date: 12/31/2017 CLINICAL DATA:  Abnormal liver function tests. Elevated  lipase. HIV. EXAM: MRI ABDOMEN WITHOUT AND WITH CONTRAST (INCLUDING MRCP) TECHNIQUE: Multiplanar multisequence MR imaging of the abdomen was performed both before and after the administration of intravenous contrast. Heavily T2-weighted images of the biliary and pancreatic ducts were obtained, and three-dimensional MRCP images were rendered by post processing. CONTRAST:  5m MULTIHANCE GADOBENATE DIMEGLUMINE 529 MG/ML IV SOLN COMPARISON:  CT 12/30/2017 FINDINGS: Lower chest:  Lung bases are clear. Hepatobiliary: No intrahepatic duct dilatation. No focal hepatic lesion. No enhancing hepatic lesion. Postcholecystectomy. Common bile duct normal caliber. Pancreas: Mild edema associated with the pancreas. Pancreatic duct is normal caliber. No organized fluid collections. Small amount fluid along the LEFT gutter. Contrast enhanced imaging demonstrates uniform enhancement of the pancreas Spleen: Normal spleen. Adrenals/urinary tract: Adrenal glands and kidneys are normal. Stomach/Bowel: Stomach and limited of the small bowel is unremarkable Vascular/Lymphatic: There are enlarged lymph nodes the retroperitoneum along the aorta the kidneys. For example 12 mm and 8 mm short axis lymph nodes LEFT aorta on image 67, series 12/19/2001 Musculoskeletal: No aggressive osseous lesion IMPRESSION: 1. Mild pancreatic edema associated with pancreatitis. 2. Moderate intraperitoneal free fluid. No organized fluid collections. 3. No biliary obstruction.  Postcholecystectomy. 4. Mild retroperitoneal lymphadenopathy favor HIV adenopathy. Electronically Signed   By: SSuzy BouchardM.D.   On: 12/31/2017 17:36   Dg Chest Port 1 View  Result Date: 01/02/2018 CLINICAL DATA:  Fever.  Status post EGD. EXAM: PORTABLE CHEST 1 VIEW COMPARISON:  Chest x-ray dated December 31, 2017. FINDINGS: Stable mild cardiomegaly. Mild interstitial edema. Small bilateral pleural effusions with bibasilar atelectasis. No pneumothorax. No acute osseous  abnormality. IMPRESSION: New mild interstitial pulmonary edema and small bilateral pleural effusions. Electronically Signed   By: WTitus DubinM.D.   On: 01/02/2018 10:09   Dg Chest Port 1 View  Result Date: 12/31/2017 CLINICAL DATA:  Shortness of breath, chest congestion, and productive cough. History of HIV, nonsmoker. EXAM: PORTABLE CHEST 1 VIEW COMPARISON:  Chest x-ray and chest CT scan of October 21, 2017 FINDINGS: The lungs are well-expanded. There is no focal infiltrate. There is a rounded approximately 1.5 cm diameter structure projecting over the posterolateral aspect of the left ninth rib which may reflect a nipple shadow. No similar finding is noted on the right however. Some asymmetry of the nipples was noted on the CT scan of October 21, 2017. The apices appear clear. The heart and pulmonary vascularity are normal. The mediastinum is normal in width. No mediastinal or hilar lymphadenopathy is  observed. There is no pleural effusion. The bony thorax is unremarkable. IMPRESSION: No acute infiltrate or definite mass. Presumed prominent nipple shadow on the left. A repeat chest x-ray with nipple markers and a lateral view would be useful to more completely evaluate the thorax in this patient with cryptogenic hemoptysis. These findings were discussed by telephone with Dr. Florene Glen. Electronically Signed   By: David  Martinique M.D.   On: 12/31/2017 13:27   Dg Esophagus Dilation  Result Date: 01/14/2018 ESOPHAGEAL DILATATION: Fluoroscopy was provided for use by the requesting physician.  No images were obtained for radiographic interpretation.  Mr Abdomen Mrcp Moise Boring Contast  Result Date: 12/31/2017 CLINICAL DATA:  Abnormal liver function tests. Elevated lipase. HIV. EXAM: MRI ABDOMEN WITHOUT AND WITH CONTRAST (INCLUDING MRCP) TECHNIQUE: Multiplanar multisequence MR imaging of the abdomen was performed both before and after the administration of intravenous contrast. Heavily T2-weighted images of the  biliary and pancreatic ducts were obtained, and three-dimensional MRCP images were rendered by post processing. CONTRAST:  49m MULTIHANCE GADOBENATE DIMEGLUMINE 529 MG/ML IV SOLN COMPARISON:  CT 12/30/2017 FINDINGS: Lower chest:  Lung bases are clear. Hepatobiliary: No intrahepatic duct dilatation. No focal hepatic lesion. No enhancing hepatic lesion. Postcholecystectomy. Common bile duct normal caliber. Pancreas: Mild edema associated with the pancreas. Pancreatic duct is normal caliber. No organized fluid collections. Small amount fluid along the LEFT gutter. Contrast enhanced imaging demonstrates uniform enhancement of the pancreas Spleen: Normal spleen. Adrenals/urinary tract: Adrenal glands and kidneys are normal. Stomach/Bowel: Stomach and limited of the small bowel is unremarkable Vascular/Lymphatic: There are enlarged lymph nodes the retroperitoneum along the aorta the kidneys. For example 12 mm and 8 mm short axis lymph nodes LEFT aorta on image 67, series 12/19/2001 Musculoskeletal: No aggressive osseous lesion IMPRESSION: 1. Mild pancreatic edema associated with pancreatitis. 2. Moderate intraperitoneal free fluid. No organized fluid collections. 3. No biliary obstruction.  Postcholecystectomy. 4. Mild retroperitoneal lymphadenopathy favor HIV adenopathy. Electronically Signed   By: SSuzy BouchardM.D.   On: 12/31/2017 17:36    Micro Results     Recent Results (from the past 240 hour(s))  Culture, blood (routine x 2)     Status: None (Preliminary result)   Collection Time: 01/15/18  9:17 AM  Result Value Ref Range Status   Specimen Description   Final    BLOOD RIGHT ARM Performed at [facility] [place] [address]   Special Requests   Final    BOTTLES DRAWN AEROBIC AND ANAEROBIC Blood Culture adequate volume Performed at [facility] [place] [address]   Culture   Final    NO GROWTH 4  DAYS Performed at [facility] [address] [place] [address]   Report Status PENDING  Incomplete  Culture, blood (routine x 2)     Status: None (Preliminary result)   Collection Time: 01/15/18  9:18 AM  Result Value Ref Range Status   Specimen Description   Final    BLOOD LEFT HAND Performed at [facility], [place] [address]   Special Requests   Final    BOTTLES DRAWN AEROBIC ONLY Blood Culture adequate volume Performed at [facility] [place] [address]   Culture   Final    NO GROWTH 4 DAYS Performed at [facility] [address] [place] [address]  Report Status PENDING  Incomplete    Today   Subjective    Eara Busick today has no headache,no chest abdominal pain,no new weakness tingling or numbness, feels much better wants to go home today.     Objective   Blood pressure (!) 154/99, pulse 79, temperature 99.2 F (37.3 C), temperature source Oral, resp. rate 15, height 6' 1"  (1.854 m), weight 55.4 kg (122 lb 2.2 oz), last menstrual period 12/15/2017, SpO2 100 %.   Intake/Output Summary (Last 24 hours) at 01/20/2018 0950 Last data filed at 01/19/2018 2359 Gross per 24 hour  Intake 1630 ml  Output -  Net 1630 ml    Exam Awake Alert, Oriented x 3, No new F.N deficits, Normal affect [place].AT,PERRAL, does have superficial shallow ulcers on lower lip and tongue, suspicious for herpes/apathous ulcers Supple Neck,No JVD, No cervical lymphadenopathy appriciated.  Symmetrical Chest wall movement, Good air movement bilaterally, CTAB RRR,No Gallops,Rubs or new Murmurs, No Parasternal Heave +ve B.Sounds, Abd Soft, Non tender, No organomegaly appriciated, No rebound -guarding or rigidity. No Cyanosis, Clubbing or edema, No new Rash or bruise   Data Review   CBC w Diff:  Lab Results  Component Value Date   WBC 2.3 (L) 01/20/2018   HGB 8.8 (L)  01/20/2018   HCT 25.6 (L) 01/20/2018   HCT 18.7 (L) 01/04/2018   PLT 108 (L) 01/20/2018   LYMPHOPCT 4 01/20/2018   BANDSPCT 21 10/22/2017   MONOPCT 6 01/20/2018   EOSPCT 0 01/20/2018   BASOPCT 0 01/20/2018    CMP:  Lab Results  Component Value Date   NA 128 (L) 01/20/2018   K 3.7 01/20/2018   CL 99 (L) 01/20/2018   CO2 23 01/20/2018   BUN 9 01/20/2018   CREATININE 0.35 (L) 01/20/2018   CREATININE 0.66 11/12/2016   PROT 6.0 (L) 01/20/2018   ALBUMIN 1.6 (L) 01/20/2018   BILITOT 9.8 (H) 01/20/2018   ALKPHOS 330 (H) 01/20/2018   AST 82 (H) 01/20/2018   ALT 73 (H) 01/20/2018  .   Total Time in preparing paper work, data evaluation and todays exam - 68 minutes  Lala Lund M.D on 01/20/2018 at 9:50 AM  [facility]   Office  [phone]

## 2018-01-20 NOTE — Progress Notes (Signed)
MD paged regarding set of vitals prior to IVIG. Scheduled norvasc brought to room but patient refuses to take due to pill being too big. Offered to crush but she states that would cause her to "throw up"

## 2018-01-20 NOTE — Discharge Instructions (Signed)
Follow with Primary MD Madison Ebbs, MD in 7 days   Get CBC, CMP, 2 view Chest X ray checked  by Primary MD in 5-7 days    Activity: As tolerated with Full fall precautions use walker/cane & assistance as needed  Disposition Home    Diet: Soft diet for 5-7 days then advance as tolerated to regular consistency heart healthy.  For Heart failure patients - Check your Weight same time everyday, if you gain over 2 pounds, or you develop in leg swelling, experience more shortness of breath or chest pain, call your Primary MD immediately. Follow Cardiac Low Salt Diet and 1.5 lit/day fluid restriction.  Special Instructions: If you have smoked or chewed Tobacco  in the last 2 yrs please stop smoking, stop any regular Alcohol  and or any Recreational drug use.  On your next visit with your primary care physician please Get Medicines reviewed and adjusted.  Please request your Prim.MD to go over all Hospital Tests and Procedure/Radiological results at the follow up, please get all Hospital records sent to your Prim MD by signing hospital release before you go home.  If you experience worsening of your admission symptoms, develop shortness of breath, life threatening emergency, suicidal or homicidal thoughts you must seek medical attention immediately by calling 911 or calling your MD immediately  if symptoms less severe.  You Must read complete instructions/literature along with all the possible adverse reactions/side effects for all the Medicines you take and that have been prescribed to you. Take any new Medicines after you have completely understood and accpet all the possible adverse reactions/side effects.   Do not drive, operate heavy machinery, perform activities at heights, swimming or participation in water activities or provide baby sitting services if your were admitted for syncope or siezures until you have seen by Primary MD or a Neurologist and advised to do so again.  Do not drive when  taking Pain medications.    Do not take more than prescribed Pain, Sleep and Anxiety Medications  Wear Seat belts while driving.   Please note  You were cared for by a hospitalist during your hospital stay. If you have any questions about your discharge medications or the care you received while you were in the hospital after you are discharged, you can call the unit and asked to speak with the hospitalist on call if the hospitalist that took care of you is not available. Once you are discharged, your primary care physician will handle any further medical issues. Please note that NO REFILLS for any discharge medications will be authorized once you are discharged, as it is imperative that you return to your primary care physician (or establish a relationship with a primary care physician if you do not have one) for your aftercare needs so that they can reassess your need for medications and monitor your lab values.

## 2018-01-22 ENCOUNTER — Ambulatory Visit: Payer: Medicaid Other | Admitting: Internal Medicine

## 2018-01-22 DIAGNOSIS — K222 Esophageal obstruction: Secondary | ICD-10-CM

## 2018-01-22 DIAGNOSIS — F3112 Bipolar disorder, current episode manic without psychotic features, moderate: Secondary | ICD-10-CM

## 2018-01-22 DIAGNOSIS — K828 Other specified diseases of gallbladder: Secondary | ICD-10-CM | POA: Diagnosis present

## 2018-01-22 LAB — COMPREHENSIVE METABOLIC PANEL
AG Ratio: 0.8 (calc) — ABNORMAL LOW (ref 1.0–2.5)
ALBUMIN MSPROF: 3 g/dL — AB (ref 3.6–5.1)
ALT: 100 U/L — AB (ref 6–29)
AST: 144 U/L — ABNORMAL HIGH (ref 10–30)
Alkaline phosphatase (APISO): 411 U/L — ABNORMAL HIGH (ref 33–115)
BUN: 15 mg/dL (ref 7–25)
CO2: 22 mmol/L (ref 20–32)
CREATININE: 0.54 mg/dL (ref 0.50–1.10)
Calcium: 8.9 mg/dL (ref 8.6–10.2)
Chloride: 97 mmol/L — ABNORMAL LOW (ref 98–110)
Globulin: 3.8 g/dL (calc) — ABNORMAL HIGH (ref 1.9–3.7)
Glucose, Bld: 86 mg/dL (ref 65–99)
Potassium: 4.5 mmol/L (ref 3.5–5.3)
SODIUM: 132 mmol/L — AB (ref 135–146)
Total Bilirubin: 17.3 mg/dL — ABNORMAL HIGH (ref 0.2–1.2)
Total Protein: 6.8 g/dL (ref 6.1–8.1)

## 2018-01-22 NOTE — Progress Notes (Signed)
Patient Active Problem List   Diagnosis Date Noted  . Biliary dyskinesia 10/16/2017    Priority: High  . HIV disease ([name]) 02/28/2009    Priority: High  . Bipolar disorder ([name]) 04/01/2012    Priority: Medium  . Pressure injury of skin 01/16/2018  . Lactic acidosis   . Pancreatitis 12/30/2017  . Pancytopenia ([name]) 10/13/2017  . Severe protein-calorie malnutrition ([name]) 10/26/2016  . Status post thyroidectomy 10/25/2016  . Asthma 10/25/2016  . GERD (gastroesophageal reflux disease) 10/25/2016  . Candidal esophagitis ([name]) 10/25/2016  . Esophageal stricture 12/06/2014  . 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 Hypokalemia 07/09/2013  . Genital herpes 02/28/2009    Patient's Medications  New Prescriptions   No medications on file  Previous Medications   ALBUTEROL (PROAIR HFA) 108 (90 BASE) MCG/ACT INHALER    Inhale 2 puffs into the lungs every 4 (four) hours as needed for wheezing or shortness of breath (cough).   AMLODIPINE (NORVASC) 10 MG TABLET    Take 1 tablet (10 mg total) by mouth daily.   BENZTROPINE (COGENTIN) 1 MG TABLET    Take 1 tablet (1 mg total) by mouth daily.   BICTEGRAVIR-EMTRICITABINE-TENOFOVIR AF (BIKTARVY) 50-200-25 MG TABS TABLET    Take 1 tablet daily by mouth.   DAPSONE 100 MG TABLET    Take 100 mg by mouth daily.   FLUCONAZOLE (DIFLUCAN) 200 MG TABLET    Take 1 tablet (200 mg total) daily by mouth.   MAGIC MOUTHWASH W/LIDOCAINE SOLN    Take 10 mLs by mouth 4 (four) times daily as needed for mouth pain.   OLANZAPINE (ZYPREXA) 5 MG TABLET    Take 1 tablet (5 mg total) by mouth at bedtime.   ONDANSETRON (ZOFRAN ODT) 8 MG DISINTEGRATING TABLET    Take 1 tablet (8 mg total) by mouth every 8 (eight) hours as needed for nausea or vomiting.   PANTOPRAZOLE (PROTONIX) 40 MG TABLET    Take 1 tablet (40 mg total) by mouth daily.   TRAMADOL (ULTRAM) 50 MG TABLET    Take 1 tablet (50 mg total) by mouth every 6 (six) hours as needed for moderate  pain.   VALACYCLOVIR (VALTREX) 1000 MG TABLET    Take 1 tablet (1,000 mg total) by mouth 2 (two) times daily.  Modified Medications   No medications on file  Discontinued Medications   No medications on file    Subjective: [name] is in for her hospital follow-up visit.  She was readmitted to the hospital on 12/30/2017 with severe abdominal pain, nausea, vomiting and dysphasia.  She was found to have pancreatitis, increasing liver enzymes and bilirubin and severe ulcerative esophagitis with a distal stricture and patchy candidiasis.  She tells me that she had only taken [name] for about 6 days after being discharged in November.  She was taking it each evening with fluconazole but stopped it because she was having nausea and vomiting.  She restarted it about 3 days before this admission.  She continued on Biktarvy after admission and her abdominal pain and her elevated lipase resolved.  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  Because of a few case reports of these conditions associated with HIV and antiretroviral therapy her [name] was stopped 10 days ago.  Her liver enzyme and bilirubin been slowly declining.  She was discharged  home 2 days ago.  She is still having odynophagia and has not had anything to take for pain at home other than her Protonix, Magic mouthwash and [name].  She has also been taking her amlodipine, Cogentin and Zyprexa.  She developed mouth and lip ulcers just before discharge and was started on valacyclovir.  Her ulcers are improving.  She has not seen anyone for her bipolar disorder in several years.  She has been treating herself at home with her Cogentin and Zyprexa.  She recently elected to tell her oldest son about her HIV.  She says that her bipolar condition causes her to lash out at people close to her.  She also has problems with stuttering  which is very embarrassing for her.  She has lost a great deal of weight.  She is worried about her liver and whether or not she will need a liver transplant.  She is also worried about being off of her antiretroviral therapy.  She tells me that she does want to get better.  Review of Systems: Review of Systems  Constitutional: Positive for malaise/fatigue and weight loss. Negative for chills, diaphoresis and fever.  HENT: Negative for congestion, hearing loss and sore throat.        Dysphasia with solids and large pills and odynophagia.  Eyes: Negative for blurred vision.  Respiratory: Negative for cough, sputum production and shortness of breath.   Cardiovascular: Negative for chest pain.  Gastrointestinal: Positive for heartburn. Negative for abdominal pain, constipation, diarrhea, nausea and vomiting.  Genitourinary: Negative for dysuria.  Musculoskeletal: Negative for joint pain and myalgias.  Skin: Negative for rash.  Neurological: Positive for weakness. Negative for dizziness and headaches.  Psychiatric/Behavioral: Positive for depression. Negative for substance abuse. The patient is nervous/anxious and has insomnia.     Past Medical History:  Diagnosis Date  . Acute psychosis ([name]) 03/10/12   2nd admission in last wk for this  . Angio-edema   . Anxiety   . Asthma    inhaler 2xday  . Bipolar disorder ([name])   . Depression   . GERD (gastroesophageal reflux disease)   . Hepatitis B    /E-chart  . History of blood transfusion   . HIV positive ([place])   . Microcytic anemia    h/o per E-chart  . Noncompliance with medication regimen    /e-chart  . 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 Pyelonephritis    h/o per E-chart  . Shortness of breath dyspnea    due to Asthma  . Thyroid disease   . Urticaria     Social History   Tobacco Use  . Smoking status: Never Smoker  . Smokeless tobacco: Never Used  Substance Use Topics    . Alcohol use: No    Alcohol/week: 1.2 oz    Types: 2 Glasses of wine per week  . Drug use: No    Family History  Problem Relation Age of Onset  . Cancer Mother   . Diabetes Mother   . Diabetes Father   . Heart disease Father   . Diabetes Sister   . Urticaria Sister   . Asthma Son   . Allergic rhinitis Neg Hx   . Eczema Neg Hx   . Immunodeficiency Neg Hx     Allergies  Allergen Reactions  . Other Hives and Itching    Berries, TREE NUTS  . Peanut-Containing Drug Products Hives  . Shellfish Allergy Hives  . Bactrim Itching  .  Orange Fruit Itching  . Sulfa Antibiotics Itching    Health Maintenance  Topic Date Due  . TETANUS/TDAP  07/27/1996  . PAP SMEAR  01/07/2017  . INFLUENZA VACCINE  Completed  . HIV Screening  Completed    Objective:  Vitals:   01/22/18 1144  BP: 128/84  Pulse: (!) 120  Temp: 99.5 F (37.5 C)  Weight: 106 lb (48.1 kg)  Height: 6' (1.829 m)   Body mass index is 14.38 kg/m.  Physical Exam  Constitutional: She is oriented to person, place, and time.  She is cachectic.  She is tearful throughout the exam but engaged.  HENT:  Mouth/Throat: No oropharyngeal exudate.  Her oral and lip ulcers are drying up.  Eyes: Conjunctivae are normal.  Neck: Neck supple.  Cardiovascular: Normal rate and regular rhythm.  No murmur heard. Pulmonary/Chest: Effort normal. She has no wheezes. She has no rales.  Abdominal: Soft. She exhibits no distension and no mass. There is no tenderness.  Musculoskeletal: Normal range of motion. She exhibits no edema or tenderness.  Neurological: She is alert and oriented to person, place, and time.  Skin: No rash noted.  Psychiatric: Mood and affect normal.    Lab Results Lab Results  Component Value Date   WBC 2.3 (L) 01/20/2018   HGB 8.8 (L) 01/20/2018   HCT 25.6 (L) 01/20/2018   MCV 83.4 01/20/2018   PLT 108 (L) 01/20/2018    Lab Results  Component Value Date   CREATININE 0.35 (L) 01/20/2018   BUN 9  01/20/2018   NA 128 (L) 01/20/2018   K 3.7 01/20/2018   CL 99 (L) 01/20/2018   CO2 23 01/20/2018    Lab Results  Component Value Date   ALT 73 (H) 01/20/2018   AST 82 (H) 01/20/2018   ALKPHOS 330 (H) 01/20/2018   BILITOT 9.8 (H) 01/20/2018    Lab Results  Component Value Date   CHOL 184 01/31/2015   HDL 68 01/31/2015   LDLCALC 85 01/31/2015   TRIG 400 (H) 12/30/2017   CHOLHDL 2.7 01/31/2015   Lab Results  Component Value Date   LABRPR NON REAC 01/31/2015   HIV 1 RNA Quant  Date Value  01/06/2018 25,500 copies/mL  09/03/2017 4,400,000 Copies/mL (H)  07/29/2017 849,000 copies/mL   CD4 T Cell Abs (/uL)  Date Value  01/05/2018 8 (L)  10/13/2017 10 (L)  09/03/2017 20 (L)     Problem List Items Addressed This Visit      High   Biliary dyskinesia    I am not sure what is causing her recent elevated liver enzymes, bilirubin and vanishing bile duct syndrome.  I note that her alkaline phosphatase was elevated last April well before she started [place].  Her liver enzymes and bilirubin did go up after starting [place] in November and are coming down after stopping it 10 days ago.  However we have no liver enzymes between the hospitalization in November and readmission in January.  Her exposure to [facility] over that time period was very minimal.  I will recheck her liver enzymes today and we will see her back next week.  We need to consider restarting antiretroviral therapy soon as her CD4 count is dangerously low.  I do not think she needs referral to a transplant center at this time.      Relevant Orders   Comprehensive metabolic panel     Medium   Bipolar disorder ([place])    Her bipolar disorder has been a  major obstacle to staying in care and therapy.  She now seems much more motivated to address this.  I had her meet with our behavioral health counselors here today.  She will follow-up with him in 1 week.        Unprioritized   Esophageal stricture    She is quite  symptomatic with painful swallowing.  She is able to eat and had a biscuit with gravy yesterday and some pot pie last night.  She will try liquid ibuprofen for her pain.  If that does not work and the pain is severe she can try liquid acetaminophen.           Michel Bickers, MD [facility] for [facility] [phone] pager   [phone] cell 01/22/2018, 1:18 PM

## 2018-01-22 NOTE — Assessment & Plan Note (Signed)
I am not sure what is causing her recent elevated liver enzymes, bilirubin and vanishing bile duct syndrome.  I note that her alkaline phosphatase was elevated last April well before she started [place].  Her liver enzymes and bilirubin did go up after starting [place] in November and are coming down after stopping it 10 days ago.  However we have no liver enzymes between the hospitalization in November and readmission in January.  Her exposure to [facility] over that time period was very minimal.  I will recheck her liver enzymes today and we will see her back next week.  We need to consider restarting antiretroviral therapy soon as her CD4 count is dangerously low.  I do not think she needs referral to a transplant center at this time.

## 2018-01-22 NOTE — Assessment & Plan Note (Signed)
She is quite symptomatic with painful swallowing.  She is able to eat and had a biscuit with gravy yesterday and some pot pie last night.  She will try liquid ibuprofen for her pain.  If that does not work and the pain is severe she can try liquid acetaminophen.

## 2018-01-22 NOTE — Assessment & Plan Note (Signed)
Her bipolar disorder has been a major obstacle to staying in care and therapy.  She now seems much more motivated to address this.  I had her meet with our behavioral health counselors here today.  She will follow-up with him in 1 week.

## 2018-01-29 ENCOUNTER — Ambulatory Visit: Payer: Medicaid Other

## 2018-02-04 NOTE — Progress Notes (Signed)
Arrived at the hospital and sat with [name] today. We discussed her health concerns, goals and barriers to care during this visit. Gerre is not ready to commit to having me visit her again but stated she will give me a call after she is discharged.  Spent about 2hrs engaging with [place] and discussing her concerns today

## 2018-02-10 LAB — FUNGUS CULTURE WITH STAIN

## 2018-02-10 LAB — FUNGAL ORGANISM REFLEX

## 2018-02-10 LAB — FUNGUS CULTURE RESULT

## 2018-02-13 DEATH — deceased

## 2018-02-19 LAB — ACID FAST CULTURE WITH REFLEXED SENSITIVITIES (MYCOBACTERIA)
Acid Fast Culture: NEGATIVE
Acid Fast Culture: NEGATIVE

## 2018-02-24 LAB — ACID FAST CULTURE WITH REFLEXED SENSITIVITIES (MYCOBACTERIA): Acid Fast Culture: NEGATIVE

## 2019-06-25 IMAGING — DX DG CHEST 1V PORT
1 series · 1 of 1 positions shown · non-contrast
Comparison: Chest x-ray and chest CT scan October 21, 2017

CLINICAL DATA: Shortness of breath, chest congestion, and
productive cough. History of HIV, nonsmoker.

EXAM:
PORTABLE CHEST 1 VIEW

[chest ap]
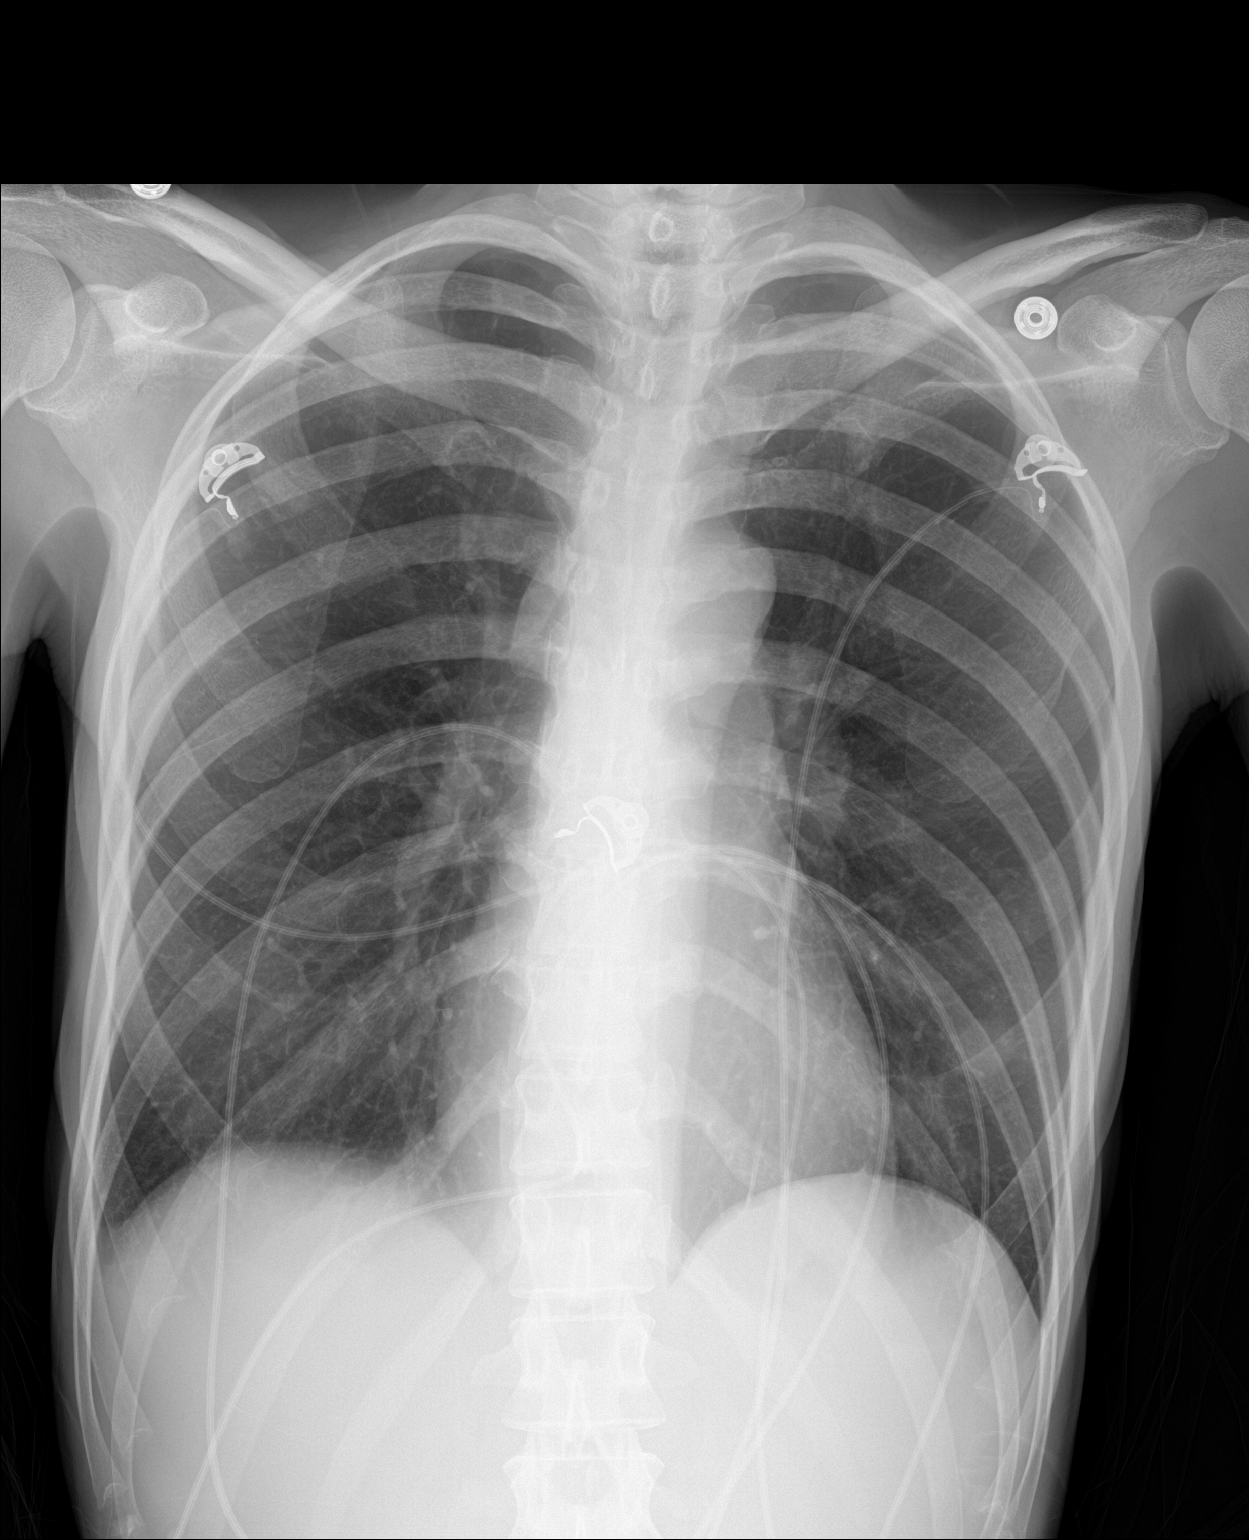

[1 of 1 positions shown; findings below may reference images not displayed]

FINDINGS: The lungs are well-expanded. There is no focal infiltrate. There is
a rounded approximately 1.5 cm diameter structure projecting over
the posterolateral aspect of the left ninth rib which may reflect a
nipple shadow. No similar finding is noted on the right however.
Some asymmetry of the nipples was noted on the CT scan October 21, 2017. The apices appear clear. The heart and pulmonary
vascularity are normal. The mediastinum is normal in width. No
mediastinal or hilar lymphadenopathy is observed. There is no
pleural effusion. The bony thorax is unremarkable.
IMPRESSION: No acute infiltrate or definite mass. Presumed prominent nipple
shadow on the left. A repeat chest x-ray with nipple markers and a
lateral view would be useful to more completely evaluate the thorax
in this patient with cryptogenic hemoptysis.

These findings were discussed by telephone with Dr. [place].

## 2019-06-25 IMAGING — DX DG CHEST 2V
2 series · 2 of 2 positions shown · non-contrast
Comparison: 0077 hours

CLINICAL DATA: Nipple marker request

EXAM:
CHEST  2 VIEW

[chest lat]
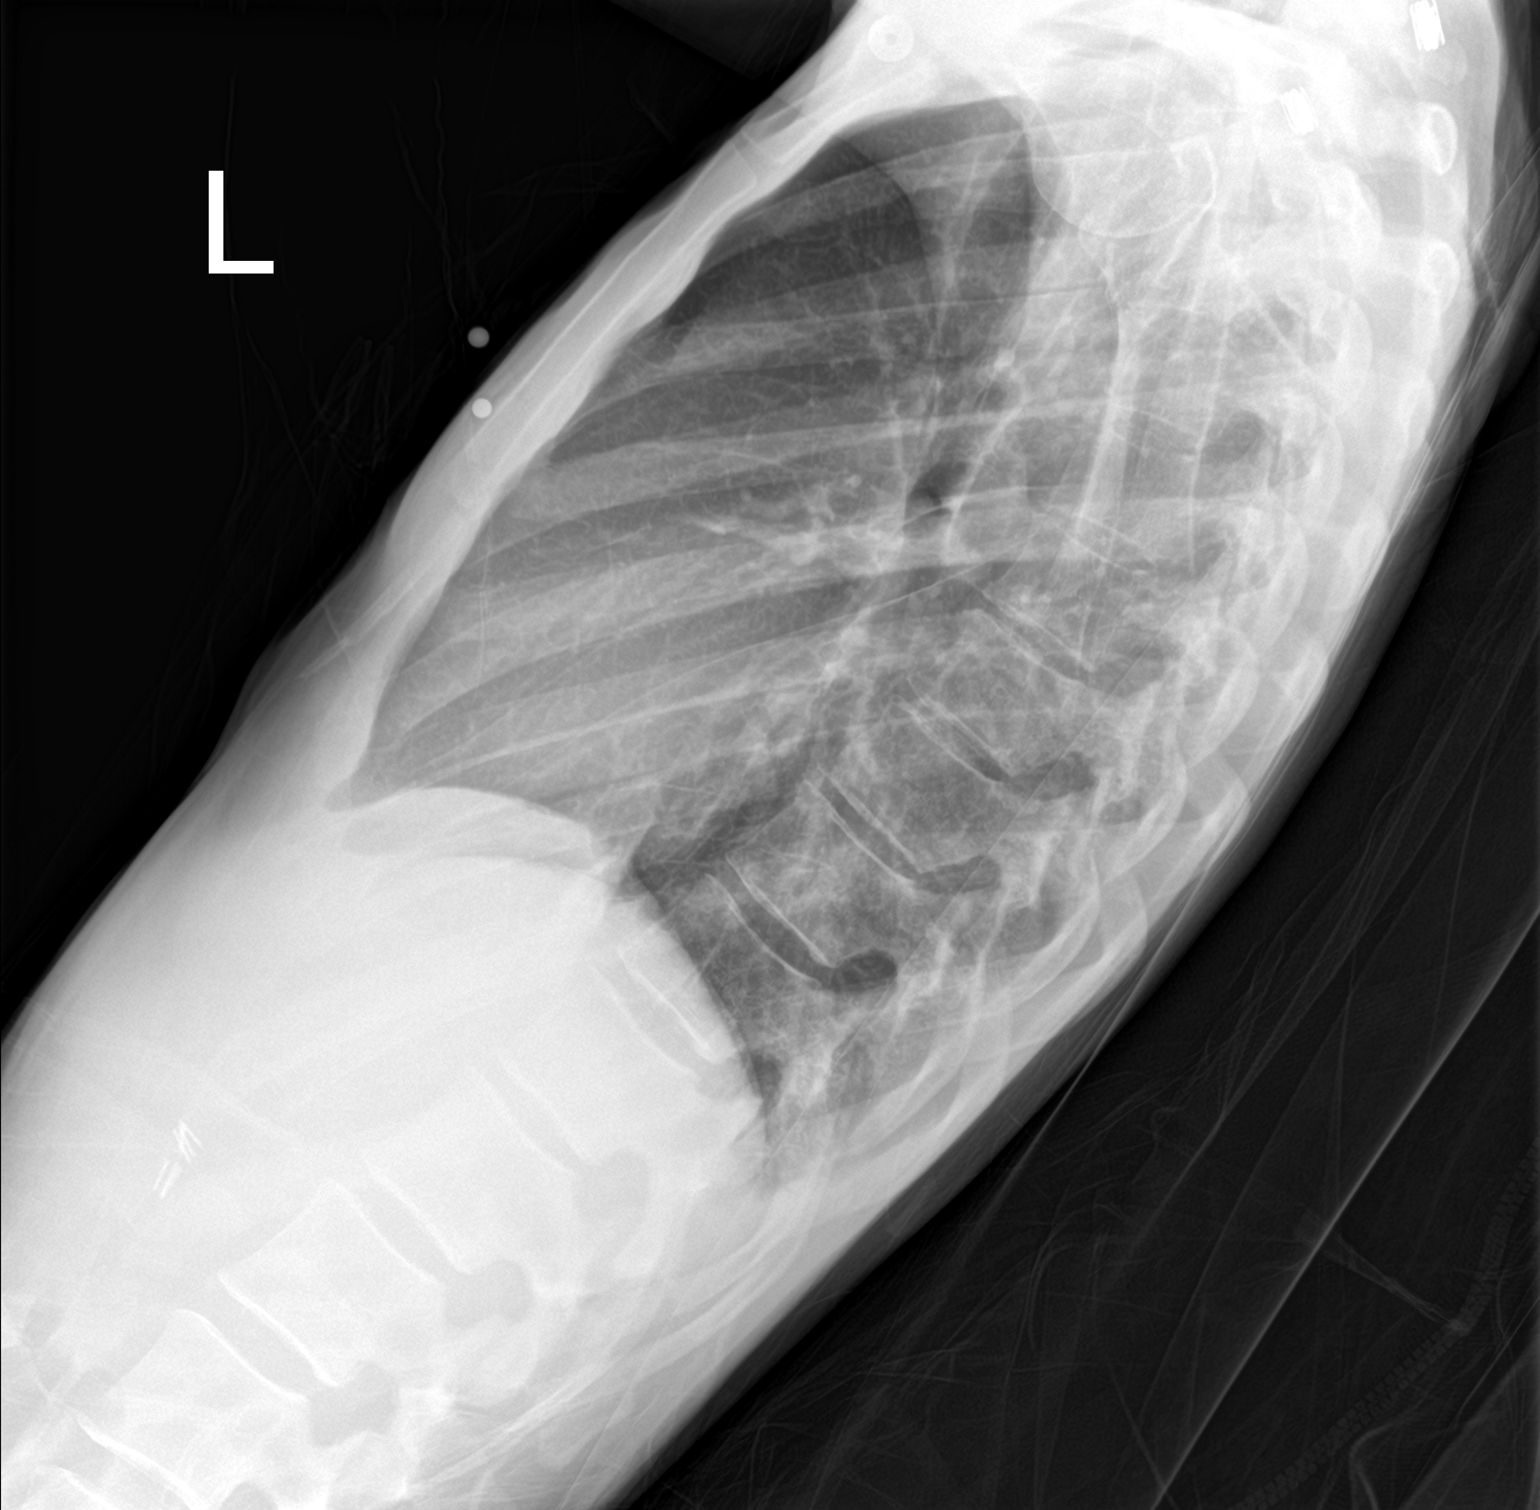

[chest ap]
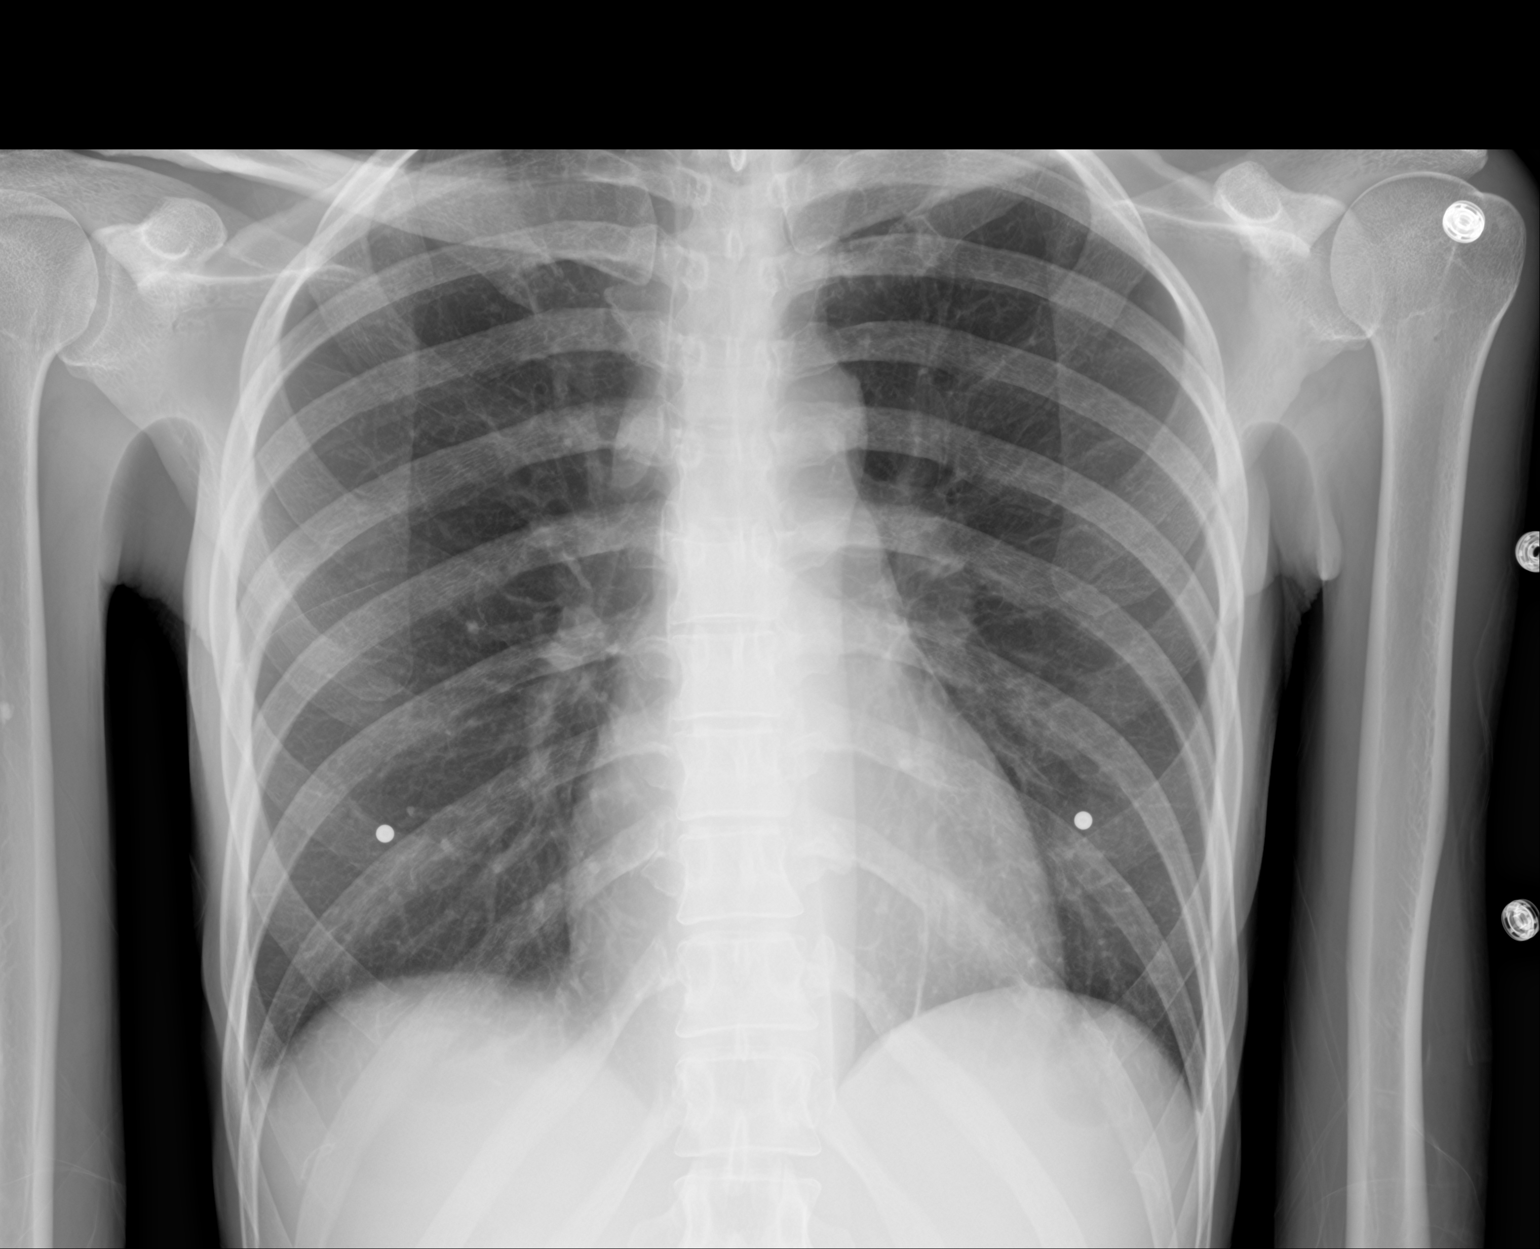

[2 of 2 positions shown; findings below may reference images not displayed]

FINDINGS: Left basilar nodular density corresponds to the nipple marker.
Allowing for nipple shadows, lungs are clear other than minimal
subsegmental atelectasis for scarring at the left base. Lungs are
clear. No pneumothorax or pleural effusion. Mild cardiomegaly.
Normal vascularity.
IMPRESSION: No active cardiopulmonary disease.

## 2019-06-27 IMAGING — DX DG CHEST 1V PORT
1 series · 1 of 1 positions shown · non-contrast
Comparison: Chest x-ray dated December 31, 2017.

CLINICAL DATA: Fever.  Status post EGD.

EXAM:
PORTABLE CHEST 1 VIEW

[chest ap]
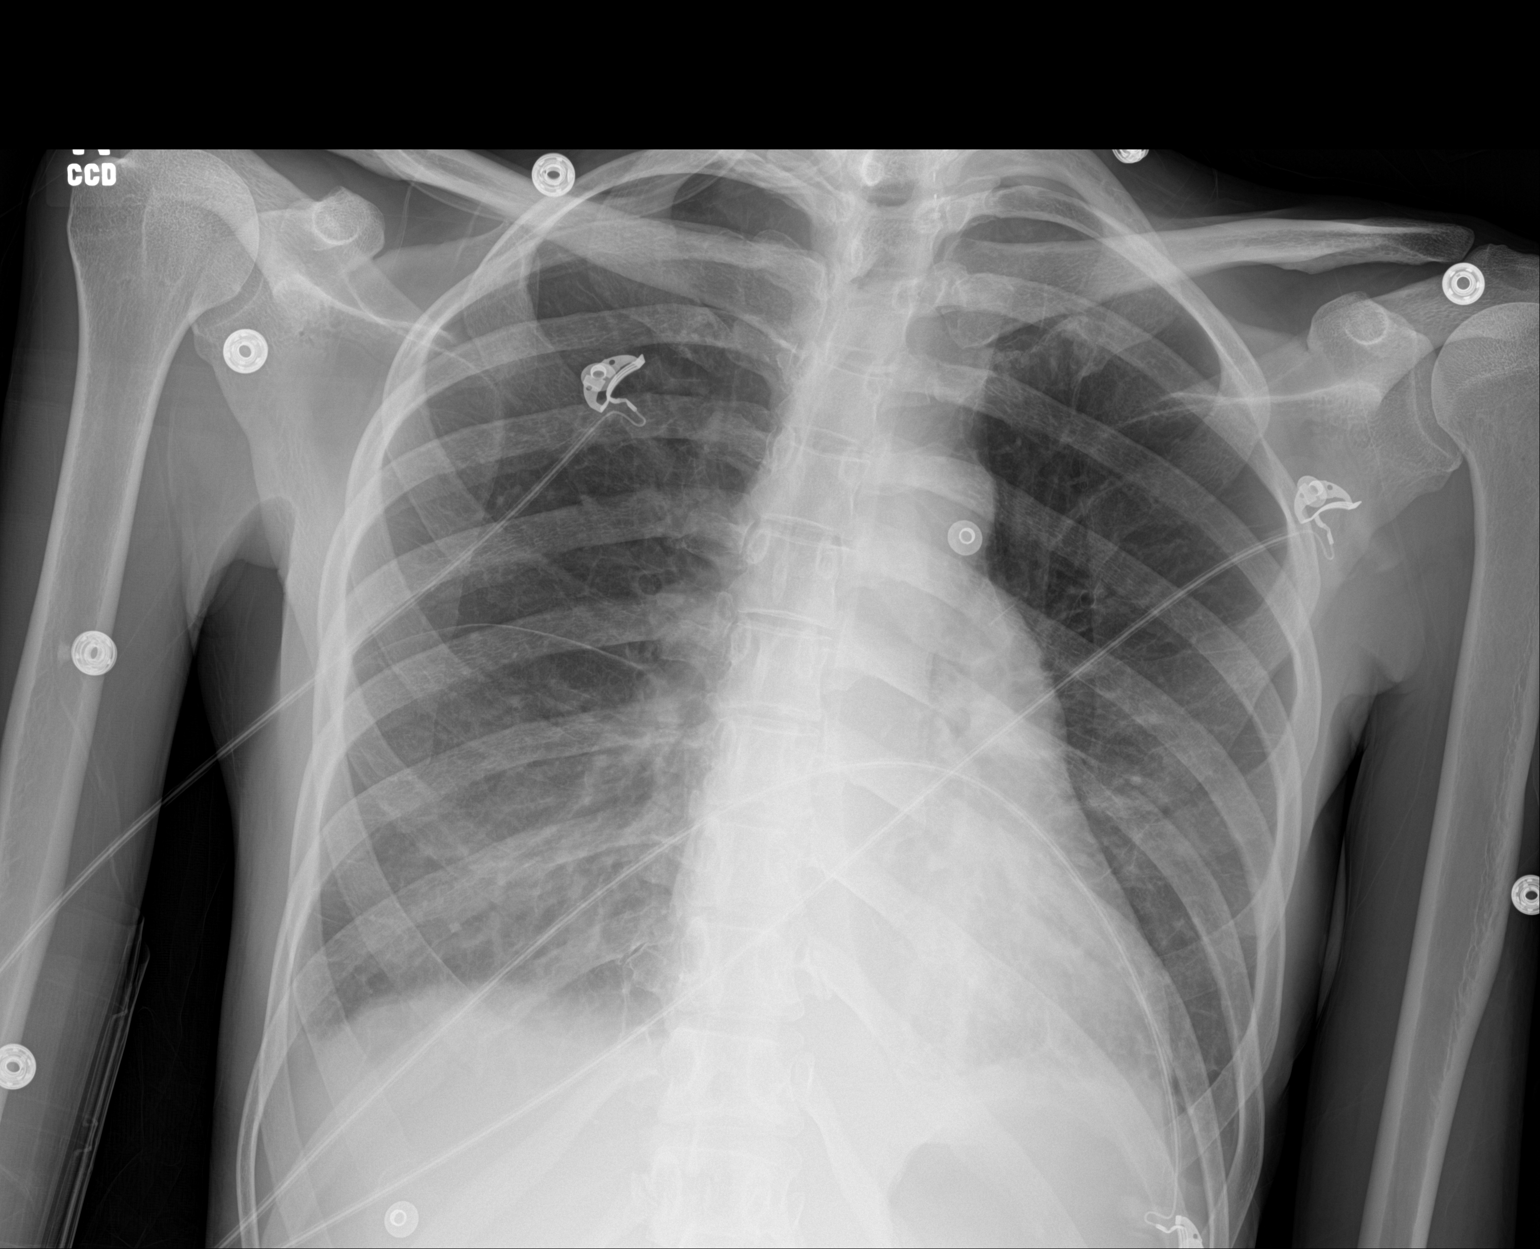

[1 of 1 positions shown; findings below may reference images not displayed]

FINDINGS: Stable mild cardiomegaly. Mild interstitial edema. Small bilateral
pleural effusions with bibasilar atelectasis. No pneumothorax. No
acute osseous abnormality.
IMPRESSION: New mild interstitial pulmonary edema and small bilateral pleural
effusions.

## 2019-06-30 IMAGING — CT CT HEAD W/O CM
3 series · 16 of 45 positions shown, 19 images · non-contrast
Comparison: Head CT 03/10/2012.

CLINICAL DATA: 41-year-old female with history of altered level of
consciousness. Fall this morning. Possible seizure.

EXAM:
CT HEAD WITHOUT CONTRAST
TECHNIQUE: Contiguous axial images were obtained from the base of the skull
through the vertex without intravenous contrast.

[Series 2: head wo · axial · 0.41mm/px · z∈[-145,-30]mm · 10 of 28 slices shown, 13 images]
[im 3/28  brain]
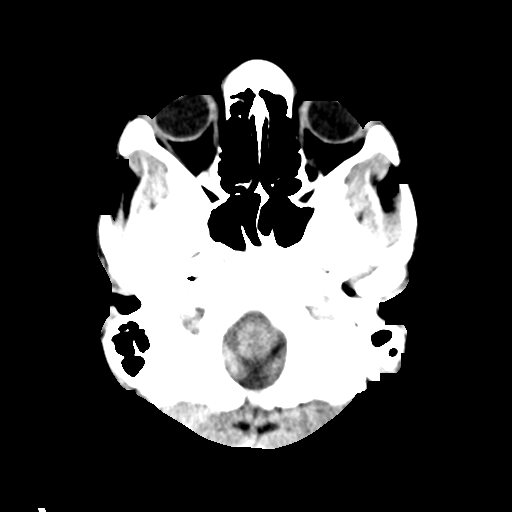
[im 3/28  bone]
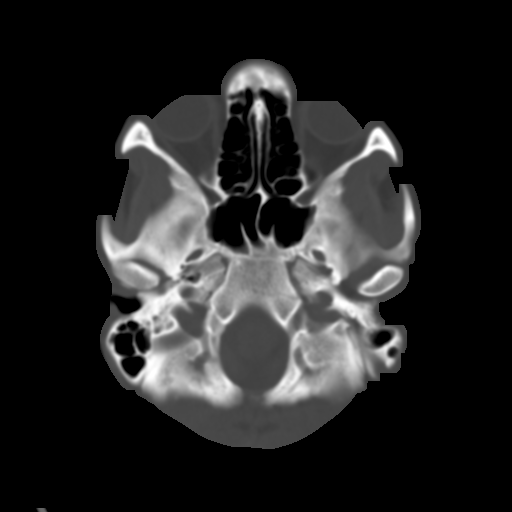
[im 5/28  brain]
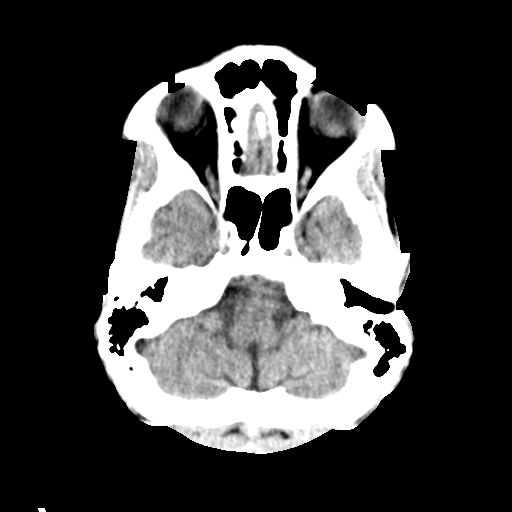
[im 8/28  brain]
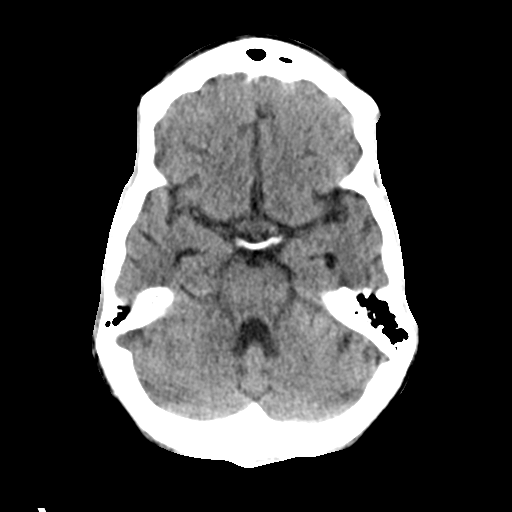
[im 11/28  brain]
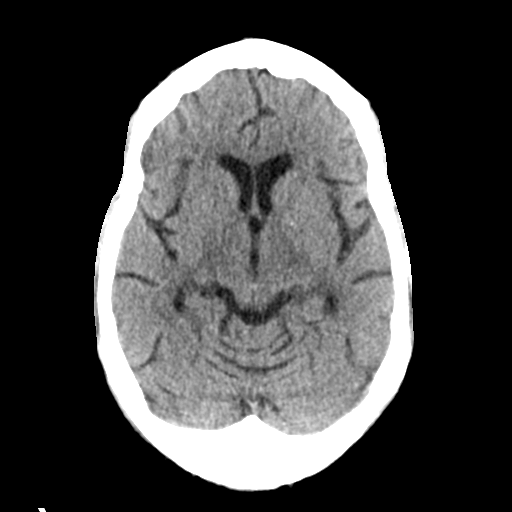
[im 13/28  brain]
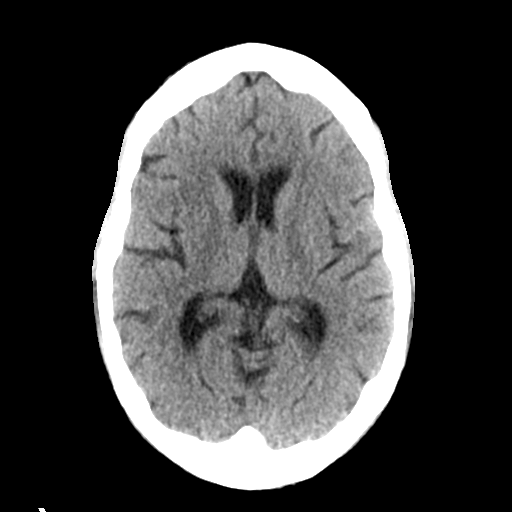
[im 13/28  bone]
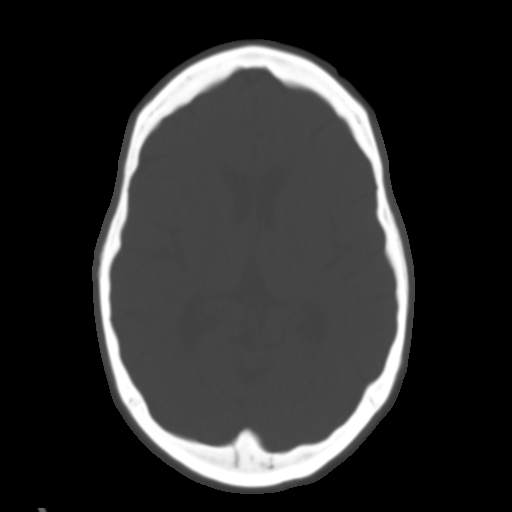
[im 16/28  brain]
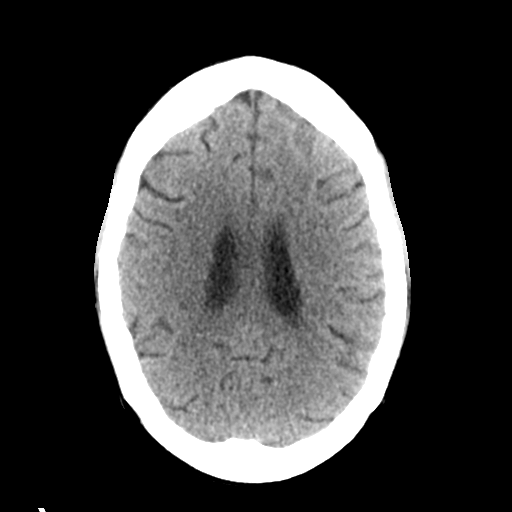
[im 18/28  brain]
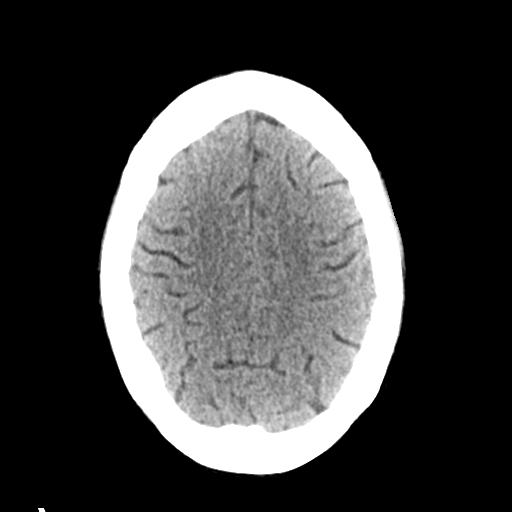
[im 21/28  brain]
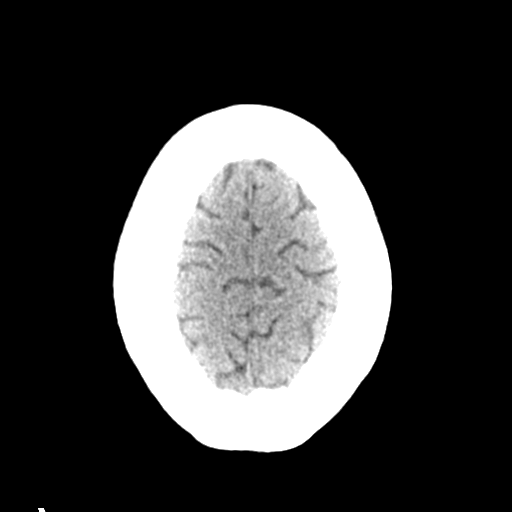
[im 24/28  brain]
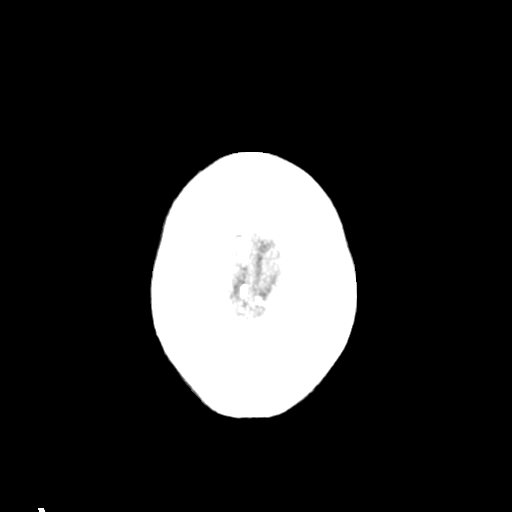
[im 24/28  bone]
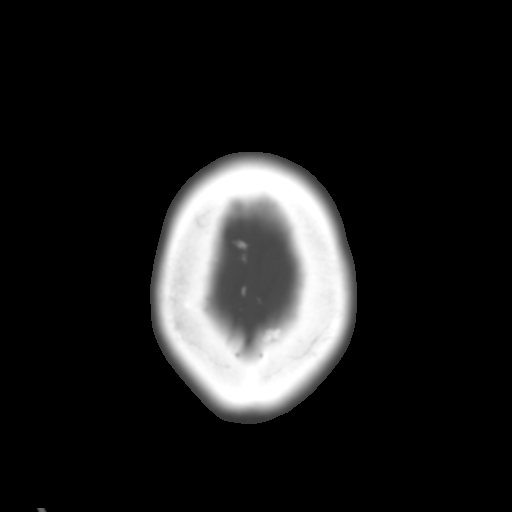
[im 26/28  brain]
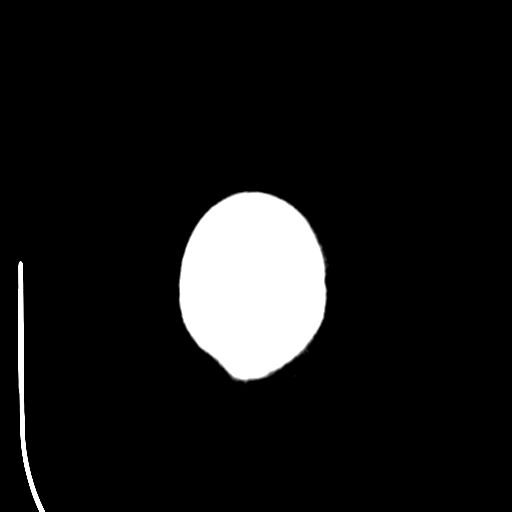

[Series 4: coronal soft tissue · coronal · 0.27mm/px · 3 of 63 slices shown]
[im 21/63  brain]
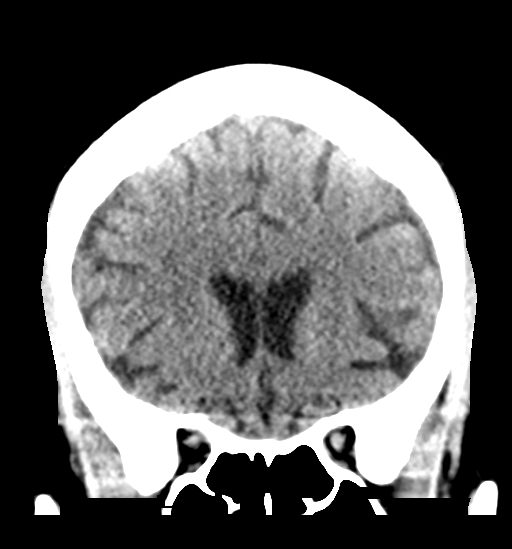
[im 28/63  brain]
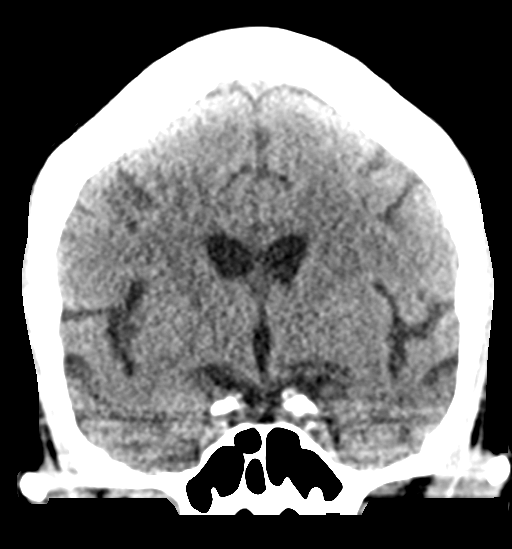
[im 35/63  brain]
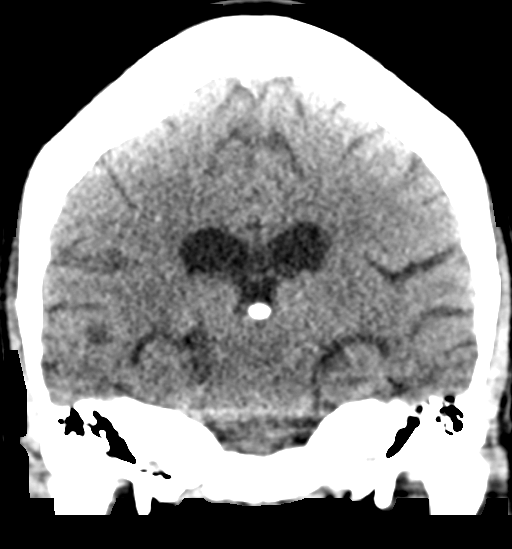

[Series 5: sagittal soft tissue · sagittal · 0.28mm/px · 3 of 46 slices shown]
[im 16/46  brain]
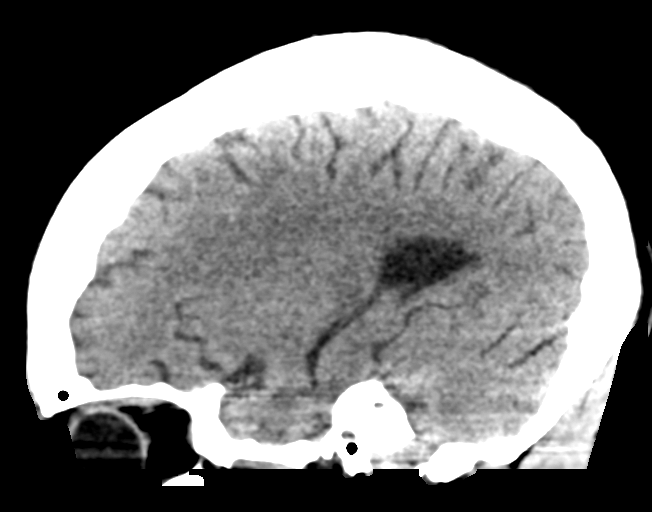
[im 23/46  brain]
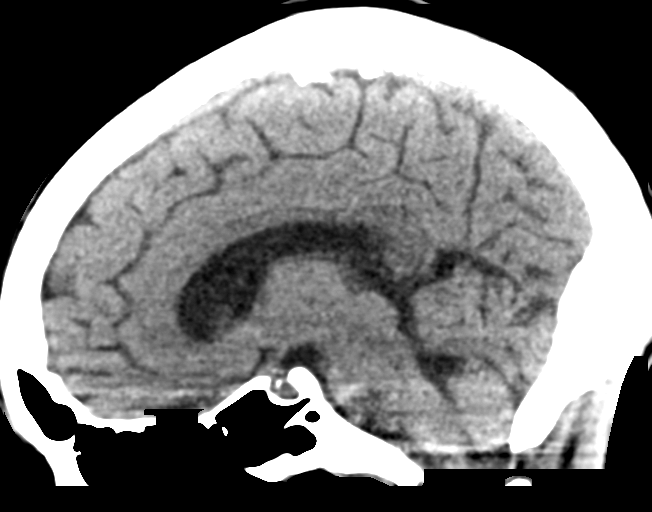
[im 31/46  brain]
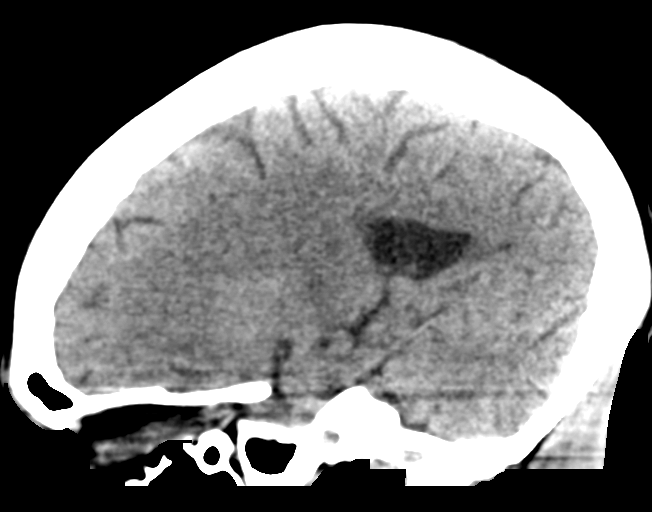

[16 of 45 positions shown; findings below may reference images not displayed]

FINDINGS: Brain: No evidence of acute infarction, hemorrhage, hydrocephalus,
extra-axial collection or mass lesion/mass effect.

Vascular: No hyperdense vessel or unexpected calcification.

Skull: Normal. Negative for fracture or focal lesion.

Sinuses/Orbits: No acute finding.

Other: None.
IMPRESSION: 1. No acute intracranial abnormalities. The appearance of the brain
is normal.
